# Patient Record
Sex: Female | Born: 1970 | Race: White | Hispanic: No | Smoking: Current every day smoker
Health system: Southern US, Community
[De-identification: ages and names within clinical notes are randomized; demographics above are authoritative.]

## PROBLEM LIST (undated history)

## (undated) DIAGNOSIS — R112 Nausea with vomiting, unspecified: Secondary | ICD-10-CM

## (undated) DIAGNOSIS — C50919 Malignant neoplasm of unspecified site of unspecified female breast: Secondary | ICD-10-CM

## (undated) DIAGNOSIS — M1990 Unspecified osteoarthritis, unspecified site: Secondary | ICD-10-CM

## (undated) DIAGNOSIS — F4310 Post-traumatic stress disorder, unspecified: Secondary | ICD-10-CM

## (undated) DIAGNOSIS — G43909 Migraine, unspecified, not intractable, without status migrainosus: Secondary | ICD-10-CM

## (undated) DIAGNOSIS — N939 Abnormal uterine and vaginal bleeding, unspecified: Secondary | ICD-10-CM

## (undated) DIAGNOSIS — Z720 Tobacco use: Secondary | ICD-10-CM

## (undated) DIAGNOSIS — T783XXA Angioneurotic edema, initial encounter: Secondary | ICD-10-CM

## (undated) DIAGNOSIS — T7840XA Allergy, unspecified, initial encounter: Secondary | ICD-10-CM

## (undated) DIAGNOSIS — R51 Headache: Secondary | ICD-10-CM

## (undated) DIAGNOSIS — F329 Major depressive disorder, single episode, unspecified: Secondary | ICD-10-CM

## (undated) DIAGNOSIS — D497 Neoplasm of unspecified behavior of endocrine glands and other parts of nervous system: Secondary | ICD-10-CM

## (undated) DIAGNOSIS — F419 Anxiety disorder, unspecified: Secondary | ICD-10-CM

## (undated) DIAGNOSIS — F32A Depression, unspecified: Secondary | ICD-10-CM

## (undated) DIAGNOSIS — IMO0002 Reserved for concepts with insufficient information to code with codable children: Secondary | ICD-10-CM

## (undated) DIAGNOSIS — F319 Bipolar disorder, unspecified: Secondary | ICD-10-CM

## (undated) DIAGNOSIS — N809 Endometriosis, unspecified: Secondary | ICD-10-CM

## (undated) DIAGNOSIS — M419 Scoliosis, unspecified: Secondary | ICD-10-CM

## (undated) DIAGNOSIS — L509 Urticaria, unspecified: Secondary | ICD-10-CM

## (undated) DIAGNOSIS — M797 Fibromyalgia: Secondary | ICD-10-CM

## (undated) DIAGNOSIS — J449 Chronic obstructive pulmonary disease, unspecified: Secondary | ICD-10-CM

## (undated) DIAGNOSIS — Z9889 Other specified postprocedural states: Secondary | ICD-10-CM

## (undated) DIAGNOSIS — K219 Gastro-esophageal reflux disease without esophagitis: Secondary | ICD-10-CM

## (undated) HISTORY — PX: FACIAL COSMETIC SURGERY: SHX629

## (undated) HISTORY — PX: BREAST SURGERY: SHX581

## (undated) HISTORY — DX: Migraine, unspecified, not intractable, without status migrainosus: G43.909

## (undated) HISTORY — PX: WISDOM TOOTH EXTRACTION: SHX21

## (undated) HISTORY — DX: Abnormal uterine and vaginal bleeding, unspecified: N93.9

## (undated) HISTORY — PX: DILATION AND CURETTAGE OF UTERUS: SHX78

## (undated) HISTORY — DX: Chronic obstructive pulmonary disease, unspecified: J44.9

## (undated) HISTORY — PX: NOSE SURGERY: SHX723

## (undated) HISTORY — PX: BREAST EXCISIONAL BIOPSY: SUR124

## (undated) HISTORY — PX: NASAL ENDOSCOPY: SHX286

## (undated) HISTORY — DX: Urticaria, unspecified: L50.9

## (undated) HISTORY — DX: Gastro-esophageal reflux disease without esophagitis: K21.9

## (undated) HISTORY — DX: Tobacco use: Z72.0

## (undated) HISTORY — DX: Allergy, unspecified, initial encounter: T78.40XA

## (undated) HISTORY — DX: Malignant neoplasm of unspecified site of unspecified female breast: C50.919

## (undated) HISTORY — DX: Angioneurotic edema, initial encounter: T78.3XXA

## (undated) NOTE — *Deleted (*Deleted)
Patient Care Team: Cain Saupe, MD as PCP - General (Family Medicine) Services, [facility] as Referring Physician Lenice Llamas, MD as Referring Physician (Gynecology) Christia Reading, MD as Consulting Physician (Otolaryngology) Raynelle Highland as Consulting Physician (Neurosurgery)  DIAGNOSIS: No diagnosis found.  SUMMARY OF ONCOLOGIC HISTORY: Oncology History  Ductal carcinoma in situ (DCIS) of right breast  06/29/2019 Initial Diagnosis   Right breast biopsy: Fibrocystic changes, pseudoangiomatous stromal hyperplasia   07/21/2019 Surgery   Right lumpectomy: Dr. Ezzard Standing: Intermediate grade DCIS 0.2 cm, 0.1 cm from anterior margin, LCIS, ER 95%, PR 30% Tis NX stage 0   07/21/2019 Cancer Staging   Staging form: Breast, AJCC 8th Edition - Pathologic stage from 07/21/2019: Stage 0 (pTis (DCIS), pN0, cM0, ER+, PR+) - Signed by Loa Socks, NP on 08/03/2019     CHIEF COMPLIANT: Follow-up of right breast DCIS  INTERVAL HISTORY: Catherine Olsen is a 73 y.o. with above-mentioned history of right breast DCIS who underwent a right lumpectomy, and declined radiation and antiestrogen therapy. She presents to the clinic todayfor follow-up.    ALLERGIES:  is allergic to other, prednisone, sulfamethoxazole, oxycodone, abilify [aripiprazole], celebrex [celecoxib], penicillins, tylenol [acetaminophen], percocet [oxycodone-acetaminophen], sulfa antibiotics, and sulfites.  MEDICATIONS:  Current Outpatient Medications  Medication Sig Dispense Refill  . albuterol (VENTOLIN HFA) 108 (90 Base) MCG/ACT inhaler Inhale 2 puffs into the lungs every 6 (six) hours as needed for wheezing or shortness of breath. 8 g 3  . ascorbic acid (VITAMIN C) 500 MG tablet Take 500 mg by mouth 3 (three) times daily.    . calcium-vitamin D (OSCAL WITH D) 500-200 MG-UNIT tablet Take 1 tablet by mouth 3 (three) times daily.    . divalproex (DEPAKOTE ER) 500 MG 24 hr tablet Take 1 tablet by mouth 2 (two) times  daily.    Marland Kitchen gabapentin (NEURONTIN) 100 MG capsule Take 1 capsule (100 mg total) by mouth 2 (two) times daily at 8am and 2pm. 60 capsule 0  . gabapentin (NEURONTIN) 600 MG tablet Take 1 tablet (600 mg total) by mouth at bedtime. 30 tablet 0  . ibuprofen (ADVIL) 800 MG tablet Take 800 mg by mouth 3 (three) times daily.    Marland Kitchen MAGNESIUM-OXIDE 400 (241.3 Mg) MG tablet Take 1 tablet by mouth in the morning, at noon, and at bedtime.     . Melatonin 10 MG TABS Take 10 mg by mouth at bedtime.    Marland Kitchen ofloxacin (FLOXIN OTIC) 0.3 % OTIC solution Place 5 drops into both ears 2 (two) times daily for 7 days. 5 mL 0  . QUEtiapine (SEROQUEL) 50 MG tablet Take 50-100 mg by mouth at bedtime.     No current facility-administered medications for this visit.    PHYSICAL EXAMINATION: ECOG PERFORMANCE STATUS: {CHL ONC ECOG PS:[phone]}  There were no vitals filed for this visit. There were no vitals filed for this visit.  BREAST:*** No palpable masses or nodules in either right or left breasts. No palpable axillary supraclavicular or infraclavicular adenopathy no breast tenderness or nipple discharge. (exam performed in the presence of a chaperone)  LABORATORY DATA:  I have reviewed the data as listed CMP Latest Ref Rng & Units 01/21/2020 12/15/2019 12/15/2019  Glucose 70 - 99 mg/dL 119(J) 478(G) 956(O)  BUN 6 - 20 mg/dL 12 13 15   Creatinine 0.44 - 1.00 mg/dL 1.30 8.65 7.84  Sodium 135 - 145 mmol/L 138 144 141  Potassium 3.5 - 5.1 mmol/L 4.2 4.1 4.0  Chloride 98 -  111 mmol/L 101 109 108  CO2 22 - 32 mmol/L 27 24 25   Calcium 8.9 - 10.3 mg/dL 9.8 9.1 8.9  Total Protein 6.5 - 8.1 g/dL 6.7 - 6.7  Total Bilirubin 0.3 - 1.2 mg/dL 0.7 - 0.6  Alkaline Phos 38 - 126 U/L 56 - 60  AST 15 - 41 U/L 19 - 16  ALT 0 - 44 U/L 22 - 15    Lab Results  Component Value Date   WBC 6.6 01/21/2020   HGB 14.4 01/21/2020   HCT 42.2 01/21/2020   MCV 95.3 01/21/2020   PLT 250 01/21/2020   NEUTROABS 2.9 01/21/2020     ASSESSMENT & PLAN:  No problem-specific Assessment & Plan notes found for this encounter.    No orders of the defined types were placed in this encounter.  The patient has a good understanding of the overall plan. she agrees with it. she will call with any problems that may develop before the next visit here.  Total time spent: *** mins including face to face time and time spent for planning, charting and coordination of care  Serena Croissant, MD 02/16/2020  I, Kirt Boys Dorshimer, am acting as scribe for Dr. Serena Croissant.  {insert scribe attestation}

---

## 1997-11-28 ENCOUNTER — Emergency Department (HOSPITAL_COMMUNITY): Admission: EM | Admit: 1997-11-28 | Discharge: 1997-11-28 | Payer: Self-pay | Admitting: Emergency Medicine

## 1998-04-18 ENCOUNTER — Emergency Department (HOSPITAL_COMMUNITY): Admission: EM | Admit: 1998-04-18 | Discharge: 1998-04-18 | Payer: Self-pay | Admitting: Emergency Medicine

## 1999-09-11 ENCOUNTER — Encounter: Admission: RE | Admit: 1999-09-11 | Discharge: 1999-09-11 | Payer: Self-pay | Admitting: *Deleted

## 1999-11-17 ENCOUNTER — Emergency Department (HOSPITAL_COMMUNITY): Admission: EM | Admit: 1999-11-17 | Discharge: 1999-11-18 | Payer: Self-pay | Admitting: Emergency Medicine

## 1999-11-18 ENCOUNTER — Encounter: Payer: Self-pay | Admitting: Emergency Medicine

## 1999-11-19 ENCOUNTER — Emergency Department (HOSPITAL_COMMUNITY): Admission: EM | Admit: 1999-11-19 | Discharge: 1999-11-19 | Payer: Self-pay | Admitting: Emergency Medicine

## 1999-11-28 ENCOUNTER — Emergency Department (HOSPITAL_COMMUNITY): Admission: EM | Admit: 1999-11-28 | Discharge: 1999-11-28 | Payer: Self-pay | Admitting: Emergency Medicine

## 1999-11-28 ENCOUNTER — Encounter: Payer: Self-pay | Admitting: Emergency Medicine

## 2000-11-29 ENCOUNTER — Emergency Department (HOSPITAL_COMMUNITY): Admission: EM | Admit: 2000-11-29 | Discharge: 2000-11-29 | Payer: Self-pay | Admitting: Emergency Medicine

## 2000-12-28 ENCOUNTER — Emergency Department (HOSPITAL_COMMUNITY): Admission: EM | Admit: 2000-12-28 | Discharge: 2000-12-28 | Payer: Self-pay | Admitting: Emergency Medicine

## 2000-12-28 ENCOUNTER — Inpatient Hospital Stay (HOSPITAL_COMMUNITY): Admission: AD | Admit: 2000-12-28 | Discharge: 2000-12-28 | Payer: Self-pay | Admitting: Obstetrics and Gynecology

## 2001-01-12 ENCOUNTER — Encounter: Payer: Self-pay | Admitting: *Deleted

## 2001-01-12 ENCOUNTER — Inpatient Hospital Stay (HOSPITAL_COMMUNITY): Admission: AD | Admit: 2001-01-12 | Discharge: 2001-01-12 | Payer: Self-pay | Admitting: Obstetrics

## 2001-01-13 ENCOUNTER — Inpatient Hospital Stay (HOSPITAL_COMMUNITY): Admission: AD | Admit: 2001-01-13 | Discharge: 2001-01-13 | Payer: Self-pay | Admitting: *Deleted

## 2001-01-14 ENCOUNTER — Inpatient Hospital Stay (HOSPITAL_COMMUNITY): Admission: AD | Admit: 2001-01-14 | Discharge: 2001-01-14 | Payer: Self-pay | Admitting: Obstetrics and Gynecology

## 2001-03-08 ENCOUNTER — Encounter: Payer: Self-pay | Admitting: Emergency Medicine

## 2001-03-08 ENCOUNTER — Emergency Department (HOSPITAL_COMMUNITY): Admission: EM | Admit: 2001-03-08 | Discharge: 2001-03-08 | Payer: Self-pay | Admitting: Emergency Medicine

## 2001-05-12 ENCOUNTER — Emergency Department (HOSPITAL_COMMUNITY): Admission: EM | Admit: 2001-05-12 | Discharge: 2001-05-12 | Payer: Self-pay | Admitting: Emergency Medicine

## 2001-05-15 ENCOUNTER — Emergency Department (HOSPITAL_COMMUNITY): Admission: EM | Admit: 2001-05-15 | Discharge: 2001-05-15 | Payer: Self-pay | Admitting: Unknown Physician Specialty

## 2001-06-04 ENCOUNTER — Emergency Department (HOSPITAL_COMMUNITY): Admission: EM | Admit: 2001-06-04 | Discharge: 2001-06-04 | Payer: Self-pay | Admitting: Emergency Medicine

## 2001-07-28 ENCOUNTER — Emergency Department (HOSPITAL_COMMUNITY): Admission: EM | Admit: 2001-07-28 | Discharge: 2001-07-28 | Payer: Self-pay | Admitting: Emergency Medicine

## 2001-07-28 ENCOUNTER — Encounter: Payer: Self-pay | Admitting: Emergency Medicine

## 2001-08-15 ENCOUNTER — Emergency Department (HOSPITAL_COMMUNITY): Admission: EM | Admit: 2001-08-15 | Discharge: 2001-08-15 | Payer: Self-pay | Admitting: Emergency Medicine

## 2001-08-15 ENCOUNTER — Encounter: Payer: Self-pay | Admitting: Emergency Medicine

## 2001-08-24 ENCOUNTER — Emergency Department (HOSPITAL_COMMUNITY): Admission: EM | Admit: 2001-08-24 | Discharge: 2001-08-24 | Payer: Self-pay

## 2001-11-09 ENCOUNTER — Emergency Department (HOSPITAL_COMMUNITY): Admission: EM | Admit: 2001-11-09 | Discharge: 2001-11-09 | Payer: Self-pay | Admitting: *Deleted

## 2001-12-05 ENCOUNTER — Emergency Department (HOSPITAL_COMMUNITY): Admission: EM | Admit: 2001-12-05 | Discharge: 2001-12-05 | Payer: Self-pay | Admitting: Emergency Medicine

## 2001-12-05 ENCOUNTER — Encounter: Payer: Self-pay | Admitting: Emergency Medicine

## 2002-02-17 ENCOUNTER — Emergency Department (HOSPITAL_COMMUNITY): Admission: EM | Admit: 2002-02-17 | Discharge: 2002-02-17 | Payer: Self-pay | Admitting: Emergency Medicine

## 2002-02-18 ENCOUNTER — Ambulatory Visit: Admission: RE | Admit: 2002-02-18 | Discharge: 2002-02-18 | Payer: Self-pay | Admitting: Physician Assistant

## 2002-04-05 ENCOUNTER — Emergency Department (HOSPITAL_COMMUNITY): Admission: EM | Admit: 2002-04-05 | Discharge: 2002-04-05 | Payer: Self-pay | Admitting: Emergency Medicine

## 2002-05-22 ENCOUNTER — Inpatient Hospital Stay (HOSPITAL_COMMUNITY): Admission: AD | Admit: 2002-05-22 | Discharge: 2002-05-22 | Payer: Self-pay | Admitting: *Deleted

## 2002-06-19 ENCOUNTER — Emergency Department (HOSPITAL_COMMUNITY): Admission: EM | Admit: 2002-06-19 | Discharge: 2002-06-19 | Payer: Self-pay

## 2002-06-24 ENCOUNTER — Emergency Department (HOSPITAL_COMMUNITY): Admission: EM | Admit: 2002-06-24 | Discharge: 2002-06-24 | Payer: Self-pay | Admitting: Emergency Medicine

## 2002-06-24 ENCOUNTER — Encounter: Payer: Self-pay | Admitting: Emergency Medicine

## 2002-06-30 ENCOUNTER — Encounter: Admission: RE | Admit: 2002-06-30 | Discharge: 2002-06-30 | Payer: Self-pay | Admitting: Internal Medicine

## 2002-07-05 ENCOUNTER — Ambulatory Visit (HOSPITAL_COMMUNITY): Admission: RE | Admit: 2002-07-05 | Discharge: 2002-07-05 | Payer: Self-pay | Admitting: *Deleted

## 2002-07-05 ENCOUNTER — Encounter: Payer: Self-pay | Admitting: *Deleted

## 2002-08-30 ENCOUNTER — Emergency Department (HOSPITAL_COMMUNITY): Admission: EM | Admit: 2002-08-30 | Discharge: 2002-08-30 | Payer: Self-pay | Admitting: Emergency Medicine

## 2002-09-05 ENCOUNTER — Encounter: Admission: RE | Admit: 2002-09-05 | Discharge: 2002-09-05 | Payer: Self-pay | Admitting: Internal Medicine

## 2002-12-24 ENCOUNTER — Emergency Department (HOSPITAL_COMMUNITY): Admission: EM | Admit: 2002-12-24 | Discharge: 2002-12-24 | Payer: Self-pay | Admitting: Emergency Medicine

## 2002-12-26 ENCOUNTER — Emergency Department (HOSPITAL_COMMUNITY): Admission: EM | Admit: 2002-12-26 | Discharge: 2002-12-26 | Payer: Self-pay | Admitting: Emergency Medicine

## 2002-12-27 ENCOUNTER — Emergency Department (HOSPITAL_COMMUNITY): Admission: EM | Admit: 2002-12-27 | Discharge: 2002-12-27 | Payer: Self-pay | Admitting: Emergency Medicine

## 2003-01-01 ENCOUNTER — Emergency Department (HOSPITAL_COMMUNITY): Admission: EM | Admit: 2003-01-01 | Discharge: 2003-01-01 | Payer: Self-pay | Admitting: Emergency Medicine

## 2003-01-10 ENCOUNTER — Encounter: Admission: RE | Admit: 2003-01-10 | Discharge: 2003-01-10 | Payer: Self-pay | Admitting: Internal Medicine

## 2003-02-23 ENCOUNTER — Encounter: Admission: RE | Admit: 2003-02-23 | Discharge: 2003-02-23 | Payer: Self-pay | Admitting: Internal Medicine

## 2003-03-01 ENCOUNTER — Encounter: Admission: RE | Admit: 2003-03-01 | Discharge: 2003-03-01 | Payer: Self-pay | Admitting: Internal Medicine

## 2003-05-15 ENCOUNTER — Emergency Department (HOSPITAL_COMMUNITY): Admission: EM | Admit: 2003-05-15 | Discharge: 2003-05-15 | Payer: Self-pay | Admitting: Emergency Medicine

## 2003-05-16 ENCOUNTER — Emergency Department (HOSPITAL_COMMUNITY): Admission: EM | Admit: 2003-05-16 | Discharge: 2003-05-17 | Payer: Self-pay | Admitting: Emergency Medicine

## 2003-05-24 ENCOUNTER — Encounter: Admission: RE | Admit: 2003-05-24 | Discharge: 2003-05-24 | Payer: Self-pay | Admitting: Internal Medicine

## 2003-06-09 ENCOUNTER — Emergency Department (HOSPITAL_COMMUNITY): Admission: EM | Admit: 2003-06-09 | Discharge: 2003-06-09 | Payer: Self-pay | Admitting: *Deleted

## 2004-01-24 ENCOUNTER — Emergency Department (HOSPITAL_COMMUNITY): Admission: EM | Admit: 2004-01-24 | Discharge: 2004-01-24 | Payer: Self-pay | Admitting: Emergency Medicine

## 2004-02-05 ENCOUNTER — Emergency Department (HOSPITAL_COMMUNITY): Admission: EM | Admit: 2004-02-05 | Discharge: 2004-02-05 | Payer: Self-pay | Admitting: Emergency Medicine

## 2004-07-01 ENCOUNTER — Ambulatory Visit: Payer: Self-pay | Admitting: Internal Medicine

## 2004-08-07 ENCOUNTER — Ambulatory Visit: Payer: Self-pay | Admitting: Internal Medicine

## 2004-09-19 ENCOUNTER — Emergency Department (HOSPITAL_COMMUNITY): Admission: EM | Admit: 2004-09-19 | Discharge: 2004-09-19 | Payer: Self-pay | Admitting: *Deleted

## 2004-09-22 ENCOUNTER — Emergency Department (HOSPITAL_COMMUNITY): Admission: EM | Admit: 2004-09-22 | Discharge: 2004-09-22 | Payer: Self-pay | Admitting: Emergency Medicine

## 2004-09-23 ENCOUNTER — Emergency Department (HOSPITAL_COMMUNITY): Admission: EM | Admit: 2004-09-23 | Discharge: 2004-09-23 | Payer: Self-pay | Admitting: Emergency Medicine

## 2004-11-18 ENCOUNTER — Encounter: Admission: RE | Admit: 2004-11-18 | Discharge: 2004-11-18 | Payer: Self-pay | Admitting: Obstetrics and Gynecology

## 2005-01-26 ENCOUNTER — Emergency Department (HOSPITAL_COMMUNITY): Admission: EM | Admit: 2005-01-26 | Discharge: 2005-01-26 | Payer: Self-pay | Admitting: Emergency Medicine

## 2005-05-13 ENCOUNTER — Emergency Department (HOSPITAL_COMMUNITY): Admission: EM | Admit: 2005-05-13 | Discharge: 2005-05-13 | Payer: Self-pay | Admitting: Emergency Medicine

## 2005-05-20 ENCOUNTER — Emergency Department (HOSPITAL_COMMUNITY): Admission: EM | Admit: 2005-05-20 | Discharge: 2005-05-20 | Payer: Self-pay | Admitting: *Deleted

## 2005-07-30 ENCOUNTER — Emergency Department (HOSPITAL_COMMUNITY): Admission: EM | Admit: 2005-07-30 | Discharge: 2005-07-30 | Payer: Self-pay | Admitting: Emergency Medicine

## 2006-02-16 ENCOUNTER — Encounter: Admission: RE | Admit: 2006-02-16 | Discharge: 2006-02-16 | Payer: Self-pay | Admitting: Obstetrics and Gynecology

## 2006-07-17 ENCOUNTER — Ambulatory Visit: Payer: Self-pay | Admitting: Internal Medicine

## 2006-07-17 DIAGNOSIS — L821 Other seborrheic keratosis: Secondary | ICD-10-CM | POA: Insufficient documentation

## 2006-07-17 DIAGNOSIS — L708 Other acne: Secondary | ICD-10-CM | POA: Insufficient documentation

## 2006-07-17 DIAGNOSIS — D239 Other benign neoplasm of skin, unspecified: Secondary | ICD-10-CM | POA: Insufficient documentation

## 2006-07-24 ENCOUNTER — Ambulatory Visit: Payer: Self-pay | Admitting: Internal Medicine

## 2006-07-24 ENCOUNTER — Encounter (INDEPENDENT_AMBULATORY_CARE_PROVIDER_SITE_OTHER): Payer: Self-pay | Admitting: Specialist

## 2006-07-24 ENCOUNTER — Other Ambulatory Visit: Admission: RE | Admit: 2006-07-24 | Discharge: 2006-07-24 | Payer: Self-pay | Admitting: Internal Medicine

## 2006-07-24 ENCOUNTER — Encounter: Payer: Self-pay | Admitting: Internal Medicine

## 2006-07-28 ENCOUNTER — Encounter: Payer: Self-pay | Admitting: Internal Medicine

## 2006-08-08 ENCOUNTER — Emergency Department (HOSPITAL_COMMUNITY): Admission: EM | Admit: 2006-08-08 | Discharge: 2006-08-08 | Payer: Self-pay | Admitting: *Deleted

## 2006-09-20 ENCOUNTER — Emergency Department (HOSPITAL_COMMUNITY): Admission: EM | Admit: 2006-09-20 | Discharge: 2006-09-20 | Payer: Self-pay | Admitting: Emergency Medicine

## 2006-11-23 ENCOUNTER — Other Ambulatory Visit (HOSPITAL_COMMUNITY): Admission: RE | Admit: 2006-11-23 | Discharge: 2006-12-04 | Payer: Self-pay | Admitting: Psychiatry

## 2006-11-24 ENCOUNTER — Ambulatory Visit: Payer: Self-pay | Admitting: Psychiatry

## 2006-12-30 ENCOUNTER — Emergency Department (HOSPITAL_COMMUNITY): Admission: EM | Admit: 2006-12-30 | Discharge: 2006-12-30 | Payer: Self-pay | Admitting: Emergency Medicine

## 2007-01-05 ENCOUNTER — Emergency Department (HOSPITAL_COMMUNITY): Admission: EM | Admit: 2007-01-05 | Discharge: 2007-01-06 | Payer: Self-pay | Admitting: Emergency Medicine

## 2007-02-15 ENCOUNTER — Other Ambulatory Visit (HOSPITAL_COMMUNITY): Admission: RE | Admit: 2007-02-15 | Discharge: 2007-05-16 | Payer: Self-pay | Admitting: Psychiatry

## 2007-02-16 ENCOUNTER — Ambulatory Visit: Payer: Self-pay | Admitting: Psychiatry

## 2007-04-01 ENCOUNTER — Emergency Department (HOSPITAL_COMMUNITY): Admission: EM | Admit: 2007-04-01 | Discharge: 2007-04-01 | Payer: Self-pay | Admitting: Emergency Medicine

## 2007-05-17 ENCOUNTER — Encounter: Admission: RE | Admit: 2007-05-17 | Discharge: 2007-05-17 | Payer: Self-pay | Admitting: General Surgery

## 2007-05-19 HISTORY — PX: BREAST BIOPSY: SHX20

## 2007-05-25 ENCOUNTER — Encounter: Admission: RE | Admit: 2007-05-25 | Discharge: 2007-05-25 | Payer: Self-pay | Admitting: General Surgery

## 2007-05-28 ENCOUNTER — Encounter: Admission: RE | Admit: 2007-05-28 | Discharge: 2007-05-28 | Payer: Self-pay | Admitting: General Surgery

## 2007-05-31 ENCOUNTER — Encounter: Admission: RE | Admit: 2007-05-31 | Discharge: 2007-05-31 | Payer: Self-pay | Admitting: General Surgery

## 2007-06-02 HISTORY — PX: BREAST BIOPSY: SHX20

## 2007-06-11 ENCOUNTER — Emergency Department (HOSPITAL_COMMUNITY): Admission: EM | Admit: 2007-06-11 | Discharge: 2007-06-11 | Payer: Self-pay | Admitting: Emergency Medicine

## 2007-06-14 ENCOUNTER — Emergency Department (HOSPITAL_COMMUNITY): Admission: EM | Admit: 2007-06-14 | Discharge: 2007-06-14 | Payer: Self-pay | Admitting: Emergency Medicine

## 2007-06-16 ENCOUNTER — Encounter: Admission: RE | Admit: 2007-06-16 | Discharge: 2007-06-16 | Payer: Self-pay | Admitting: Emergency Medicine

## 2007-07-17 ENCOUNTER — Emergency Department (HOSPITAL_COMMUNITY): Admission: EM | Admit: 2007-07-17 | Discharge: 2007-07-17 | Payer: Self-pay | Admitting: Emergency Medicine

## 2007-08-29 ENCOUNTER — Emergency Department (HOSPITAL_COMMUNITY): Admission: EM | Admit: 2007-08-29 | Discharge: 2007-08-29 | Payer: Self-pay | Admitting: Emergency Medicine

## 2007-09-15 ENCOUNTER — Telehealth: Payer: Self-pay | Admitting: Internal Medicine

## 2007-10-23 ENCOUNTER — Other Ambulatory Visit: Payer: Self-pay | Admitting: Emergency Medicine

## 2007-10-23 ENCOUNTER — Inpatient Hospital Stay (HOSPITAL_COMMUNITY): Admission: AD | Admit: 2007-10-23 | Discharge: 2007-10-31 | Payer: Self-pay | Admitting: Psychiatry

## 2007-10-23 ENCOUNTER — Ambulatory Visit: Payer: Self-pay | Admitting: Psychiatry

## 2007-11-08 ENCOUNTER — Ambulatory Visit: Payer: Self-pay | Admitting: Infectious Diseases

## 2007-12-01 ENCOUNTER — Encounter: Admission: RE | Admit: 2007-12-01 | Discharge: 2007-12-01 | Payer: Self-pay | Admitting: General Surgery

## 2007-12-05 ENCOUNTER — Encounter: Admission: RE | Admit: 2007-12-05 | Discharge: 2007-12-05 | Payer: Self-pay | Admitting: General Surgery

## 2007-12-31 ENCOUNTER — Ambulatory Visit (HOSPITAL_COMMUNITY): Payer: Self-pay | Admitting: Psychiatry

## 2008-01-05 ENCOUNTER — Telehealth: Payer: Self-pay | Admitting: Internal Medicine

## 2008-01-17 ENCOUNTER — Ambulatory Visit: Payer: Self-pay | Admitting: Internal Medicine

## 2008-01-17 ENCOUNTER — Encounter: Payer: Self-pay | Admitting: Internal Medicine

## 2008-01-17 DIAGNOSIS — G4700 Insomnia, unspecified: Secondary | ICD-10-CM | POA: Insufficient documentation

## 2008-02-13 ENCOUNTER — Emergency Department (HOSPITAL_COMMUNITY): Admission: EM | Admit: 2008-02-13 | Discharge: 2008-02-13 | Payer: Self-pay | Admitting: Emergency Medicine

## 2008-05-01 ENCOUNTER — Emergency Department (HOSPITAL_COMMUNITY): Admission: EM | Admit: 2008-05-01 | Discharge: 2008-05-01 | Payer: Self-pay | Admitting: Emergency Medicine

## 2008-06-26 ENCOUNTER — Encounter: Payer: Self-pay | Admitting: Internal Medicine

## 2008-07-28 ENCOUNTER — Inpatient Hospital Stay (HOSPITAL_COMMUNITY): Admission: RE | Admit: 2008-07-28 | Discharge: 2008-07-29 | Payer: Self-pay | Admitting: *Deleted

## 2008-07-28 ENCOUNTER — Ambulatory Visit: Payer: Self-pay | Admitting: *Deleted

## 2008-10-31 ENCOUNTER — Emergency Department (HOSPITAL_COMMUNITY): Admission: EM | Admit: 2008-10-31 | Discharge: 2008-11-01 | Payer: Self-pay | Admitting: Emergency Medicine

## 2008-11-14 ENCOUNTER — Ambulatory Visit: Payer: Self-pay | Admitting: *Deleted

## 2008-11-14 ENCOUNTER — Other Ambulatory Visit: Payer: Self-pay | Admitting: Emergency Medicine

## 2008-11-14 ENCOUNTER — Inpatient Hospital Stay (HOSPITAL_COMMUNITY): Admission: RE | Admit: 2008-11-14 | Discharge: 2008-11-15 | Payer: Self-pay | Admitting: *Deleted

## 2008-12-14 ENCOUNTER — Emergency Department (HOSPITAL_COMMUNITY): Admission: EM | Admit: 2008-12-14 | Discharge: 2008-12-14 | Payer: Self-pay | Admitting: Emergency Medicine

## 2008-12-24 ENCOUNTER — Emergency Department (HOSPITAL_COMMUNITY): Admission: EM | Admit: 2008-12-24 | Discharge: 2008-12-24 | Payer: Self-pay | Admitting: Emergency Medicine

## 2008-12-27 ENCOUNTER — Telehealth: Payer: Self-pay | Admitting: Internal Medicine

## 2009-01-09 ENCOUNTER — Encounter: Payer: Self-pay | Admitting: Internal Medicine

## 2009-01-09 ENCOUNTER — Ambulatory Visit: Payer: Self-pay | Admitting: Internal Medicine

## 2009-01-09 DIAGNOSIS — F4310 Post-traumatic stress disorder, unspecified: Secondary | ICD-10-CM | POA: Insufficient documentation

## 2009-01-09 DIAGNOSIS — IMO0002 Reserved for concepts with insufficient information to code with codable children: Secondary | ICD-10-CM | POA: Insufficient documentation

## 2009-01-10 LAB — CONVERTED CEMR LAB
Beta hcg, urine, semiquantitative: NEGATIVE
Chlamydia, Swab/Urine, PCR: NEGATIVE
GC Probe Amp, Urine: NEGATIVE

## 2009-05-19 ENCOUNTER — Other Ambulatory Visit: Payer: Self-pay | Admitting: Emergency Medicine

## 2009-05-20 ENCOUNTER — Inpatient Hospital Stay (HOSPITAL_COMMUNITY): Admission: RE | Admit: 2009-05-20 | Discharge: 2009-05-22 | Payer: Self-pay | Admitting: Psychiatry

## 2009-05-20 ENCOUNTER — Ambulatory Visit: Payer: Self-pay | Admitting: Psychiatry

## 2009-05-30 ENCOUNTER — Ambulatory Visit: Payer: Self-pay | Admitting: Internal Medicine

## 2009-05-30 DIAGNOSIS — M064 Inflammatory polyarthropathy: Secondary | ICD-10-CM | POA: Insufficient documentation

## 2009-05-30 LAB — CONVERTED CEMR LAB: Sed Rate: 7 mm/hr (ref 0–22)

## 2009-06-01 ENCOUNTER — Telehealth: Payer: Self-pay | Admitting: Internal Medicine

## 2009-06-04 ENCOUNTER — Telehealth: Payer: Self-pay | Admitting: Internal Medicine

## 2009-06-23 ENCOUNTER — Emergency Department (HOSPITAL_COMMUNITY): Admission: EM | Admit: 2009-06-23 | Discharge: 2009-06-23 | Payer: Self-pay | Admitting: Emergency Medicine

## 2009-06-25 ENCOUNTER — Ambulatory Visit: Payer: Self-pay | Admitting: Internal Medicine

## 2009-06-25 DIAGNOSIS — N939 Abnormal uterine and vaginal bleeding, unspecified: Secondary | ICD-10-CM

## 2009-06-25 HISTORY — DX: Abnormal uterine and vaginal bleeding, unspecified: N93.9

## 2009-08-09 ENCOUNTER — Encounter: Admission: RE | Admit: 2009-08-09 | Discharge: 2009-08-09 | Payer: Self-pay | Admitting: Orthopaedic Surgery

## 2009-12-08 ENCOUNTER — Ambulatory Visit: Payer: Self-pay | Admitting: Psychiatry

## 2009-12-08 ENCOUNTER — Inpatient Hospital Stay (HOSPITAL_COMMUNITY)
Admission: RE | Admit: 2009-12-08 | Discharge: 2009-12-10 | Payer: Self-pay | Source: Home / Self Care | Admitting: Psychiatry

## 2010-01-09 ENCOUNTER — Emergency Department (HOSPITAL_COMMUNITY): Admission: EM | Admit: 2010-01-09 | Discharge: 2010-01-09 | Payer: Self-pay | Admitting: Emergency Medicine

## 2010-04-05 ENCOUNTER — Inpatient Hospital Stay (HOSPITAL_COMMUNITY)
Admission: RE | Admit: 2010-04-05 | Discharge: 2010-04-08 | Payer: Self-pay | Source: Home / Self Care | Attending: Psychiatry | Admitting: Psychiatry

## 2010-04-29 ENCOUNTER — Encounter: Payer: Self-pay | Admitting: General Surgery
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
## 2010-05-07 NOTE — Progress Notes (Signed)
  Phone Note Outgoing Call   Call placed by: [facility] Call placed to: Patient Reason for Call: Discuss lab or test results Summary of Call: I spoke with patient at length regarding her test results. Patient was ANA negative however continues to believe she has an autoimmune pathology such as Lupus. She is very upset about her symptoms and is unhappy that there is not a diagnosis attached. I explained to patient that fatigue can result from a number of causes and that it is not necessarily related to autoimmune disease. Patient states she will contact a rheumatologist for further work up.

## 2010-05-07 NOTE — Progress Notes (Signed)
Summary: phone call-test results/gp  Phone Note Call from Patient   Caller: Patient Summary of Call: Pt. called requesting test results.  Can you call her?  Thanks Initial call taken by: Chinita Pester RN,  June 01, 2009 10:42 AM  Follow-up for Phone Call        Called patient back and informed her that only ESR is back and not the ANA and that she will be informed when test results are back. I told her to follow up by next friday if she has not heard anything from me. Follow-up by: [facility]

## 2010-05-10 ENCOUNTER — Other Ambulatory Visit: Payer: Self-pay | Admitting: *Deleted

## 2010-05-13 MED ORDER — TRETINOIN 0.025 % EX CREA: TOPICAL_CREAM | Freq: Every day | CUTANEOUS | Status: DC

## 2010-05-13 NOTE — Telephone Encounter (Signed)
Patient is of child bearing age. Possible teratogenic effects with Retin-A. I have educated her on this before. Ok to refill - she probably needs to be seen at least 1 time per year.

## 2010-05-14 ENCOUNTER — Inpatient Hospital Stay (INDEPENDENT_AMBULATORY_CARE_PROVIDER_SITE_OTHER)
Admission: RE | Admit: 2010-05-14 | Discharge: 2010-05-14 | Disposition: A | Discharge status: Home / Self Care | Payer: Medicare Other | Source: Ambulatory Visit | Attending: Family Medicine | Admitting: Family Medicine

## 2010-05-14 DIAGNOSIS — L708 Other acne: Secondary | ICD-10-CM

## 2010-05-20 ENCOUNTER — Other Ambulatory Visit: Payer: Self-pay | Admitting: *Deleted

## 2010-05-20 MED ORDER — TRETINOIN 0.025 % EX CREA: TOPICAL_CREAM | Freq: Every day | CUTANEOUS | Status: DC

## 2010-05-20 NOTE — Telephone Encounter (Signed)
Amount changed to 20 G at pt's request. New order put in and filled

## 2010-05-20 NOTE — Telephone Encounter (Signed)
Pt is requesting 20 G NOT 45 g.  Pharmacy needs a new Rx with correction. Will you please change?

## 2010-05-21 ENCOUNTER — Telehealth: Payer: Self-pay | Admitting: *Deleted

## 2010-05-21 NOTE — Telephone Encounter (Signed)
Pt left a rude message about her refill not being done correctly, attempted to call her back, left a message to rtc

## 2010-05-22 ENCOUNTER — Encounter: Payer: Self-pay | Admitting: Internal Medicine

## 2010-05-22 ENCOUNTER — Telehealth: Payer: Self-pay | Admitting: *Deleted

## 2010-05-22 ENCOUNTER — Ambulatory Visit (INDEPENDENT_AMBULATORY_CARE_PROVIDER_SITE_OTHER): Payer: Medicare Other | Admitting: Internal Medicine

## 2010-05-22 VITALS — BP 109/74 | HR 84 | Temp 96.1°F | Ht 68.0 in | Wt 140.3 lb

## 2010-05-22 DIAGNOSIS — L708 Other acne: Secondary | ICD-10-CM

## 2010-05-22 MED ORDER — TRETINOIN 0.025 % EX CREA: TOPICAL_CREAM | Freq: Every day | CUTANEOUS | Status: DC

## 2010-05-22 NOTE — Assessment & Plan Note (Signed)
Patient's tretinoin 0.025% was pre-authorized The assistant on the pre-authorization phone line [phone]) seemed to have difficulty with reading and spelling, so we will hope that this got through

## 2010-05-22 NOTE — Telephone Encounter (Signed)
Prior Berkley Harvey was initiated on 2/15 at 1553. Info was given over ph and requested the review be expedited, will receive answer in 24 hrs

## 2010-05-22 NOTE — Progress Notes (Signed)
  Subjective:    Patient ID: Catherine Olsen, female    DOB: February 21, 1971, 40 y.o.   MRN: [id number]  HPI Patient is here for routine follow up of her acne/acne vulgaris.  No new issues. Is followed by her psychiatrist and counselor for her Bipolar disease, currently stable although she did have a hospitalization at [facility] for severe depression in the Fall of 2011.  Not on any mood stabalizers at this time-takes temazepam.  Acne is well controlled when taking the tretinoin.   Review of Systems     Objective:   Physical Exam  Skin:          Scattered acne as noted          Assessment & Plan:

## 2010-05-29 ENCOUNTER — Other Ambulatory Visit: Payer: Self-pay | Admitting: Rheumatology

## 2010-05-29 ENCOUNTER — Ambulatory Visit (HOSPITAL_COMMUNITY)
Admission: RE | Admit: 2010-05-29 | Discharge: 2010-05-29 | Disposition: A | Discharge status: Home / Self Care | Payer: Medicare Other | Source: Ambulatory Visit | Attending: Rheumatology | Admitting: Rheumatology

## 2010-05-29 DIAGNOSIS — D869 Sarcoidosis, unspecified: Secondary | ICD-10-CM | POA: Insufficient documentation

## 2010-06-19 LAB — POCT I-STAT, CHEM 8
BUN: 6 mg/dL (ref 6–23)
Creatinine, Ser: 0.7 mg/dL (ref 0.4–1.2)
Glucose, Bld: 90 mg/dL (ref 70–99)
Hemoglobin: 16.3 g/dL — ABNORMAL HIGH (ref 12.0–15.0)
Potassium: 3.7 mEq/L (ref 3.5–5.1)

## 2010-06-19 LAB — CBC
Hemoglobin: 15.7 g/dL — ABNORMAL HIGH (ref 12.0–15.0)
MCH: 33.4 pg (ref 26.0–34.0)
MCV: 93.6 fL (ref 78.0–100.0)
RBC: 4.7 MIL/uL (ref 3.87–5.11)

## 2010-06-19 LAB — DIFFERENTIAL
Eosinophils Absolute: 0.1 10*3/uL (ref 0.0–0.7)
Lymphocytes Relative: 10 % — ABNORMAL LOW (ref 12–46)
Lymphs Abs: 0.6 10*3/uL — ABNORMAL LOW (ref 0.7–4.0)
Monocytes Relative: 5 % (ref 3–12)
Neutrophils Relative %: 83 % — ABNORMAL HIGH (ref 43–77)

## 2010-06-26 LAB — CBC
HCT: 42.7 % (ref 36.0–46.0)
Hemoglobin: 14.8 g/dL (ref 12.0–15.0)
MCHC: 34.8 g/dL (ref 30.0–36.0)
MCV: 96.4 fL (ref 78.0–100.0)
RBC: 4.43 MIL/uL (ref 3.87–5.11)

## 2010-06-26 LAB — COMPREHENSIVE METABOLIC PANEL
BUN: 16 mg/dL (ref 6–23)
CO2: 25 mEq/L (ref 19–32)
Calcium: 9.4 mg/dL (ref 8.4–10.5)
Chloride: 104 mEq/L (ref 96–112)
Creatinine, Ser: 0.89 mg/dL (ref 0.4–1.2)
GFR calc Af Amer: >60 mL/min (ref >60)
GFR calc non Af Amer: >60 mL/min (ref >60)
Glucose, Bld: 96 mg/dL (ref 70–99)
Total Bilirubin: 0.4 mg/dL (ref 0.3–1.2)

## 2010-06-26 LAB — RAPID URINE DRUG SCREEN, HOSP PERFORMED
Barbiturates: NOT DETECTED
Benzodiazepines: POSITIVE — AB
Cocaine: POSITIVE — AB
Opiates: NOT DETECTED

## 2010-06-30 LAB — WET PREP, GENITAL: Trich, Wet Prep: NONE SEEN

## 2010-06-30 LAB — URINE MICROSCOPIC-ADD ON

## 2010-06-30 LAB — URINALYSIS, ROUTINE W REFLEX MICROSCOPIC
Glucose, UA: NEGATIVE mg/dL
Leukocytes, UA: NEGATIVE
Protein, ur: NEGATIVE mg/dL
pH: 6.5 (ref 5.0–8.0)

## 2010-06-30 LAB — POCT I-STAT, CHEM 8
BUN: 4 mg/dL — ABNORMAL LOW (ref 6–23)
Creatinine, Ser: 0.7 mg/dL (ref 0.4–1.2)
Sodium: 139 mEq/L (ref 135–145)
TCO2: 27 mmol/L (ref 0–100)

## 2010-06-30 LAB — GC/CHLAMYDIA PROBE AMP, GENITAL: Chlamydia, DNA Probe: NEGATIVE

## 2010-07-12 LAB — WET PREP, GENITAL
Clue Cells Wet Prep HPF POC: NONE SEEN
Trich, Wet Prep: NONE SEEN
Yeast Wet Prep HPF POC: NONE SEEN

## 2010-07-12 LAB — RAPID STREP SCREEN (MED CTR MEBANE ONLY): Streptococcus, Group A Screen (Direct): POSITIVE — AB

## 2010-07-12 LAB — URINALYSIS, ROUTINE W REFLEX MICROSCOPIC
Bilirubin Urine: NEGATIVE
Glucose, UA: NEGATIVE mg/dL
Ketones, ur: 15 mg/dL — AB
Nitrite: NEGATIVE
Protein, ur: NEGATIVE mg/dL
Specific Gravity, Urine: 1.016 (ref 1.005–1.030)
Urobilinogen, UA: 0.2 mg/dL (ref 0.0–1.0)
pH: 5.5 (ref 5.0–8.0)

## 2010-07-12 LAB — GC/CHLAMYDIA PROBE AMP, GENITAL
Chlamydia, DNA Probe: NEGATIVE
GC Probe Amp, Genital: NEGATIVE

## 2010-07-12 LAB — URINE MICROSCOPIC-ADD ON

## 2010-07-12 LAB — POCT PREGNANCY, URINE: Preg Test, Ur: NEGATIVE

## 2010-07-13 LAB — DIFFERENTIAL
Basophils Absolute: 0.1 10*3/uL (ref 0.0–0.1)
Basophils Relative: 1 % (ref 0–1)
Eosinophils Absolute: 0 10*3/uL (ref 0.0–0.7)
Eosinophils Relative: 0 % (ref 0–5)
Lymphocytes Relative: 9 % — ABNORMAL LOW (ref 12–46)
Lymphs Abs: 1.3 10*3/uL (ref 0.7–4.0)
Monocytes Absolute: 0.5 10*3/uL (ref 0.1–1.0)
Monocytes Relative: 4 % (ref 3–12)
Neutro Abs: 12.1 10*3/uL — ABNORMAL HIGH (ref 1.7–7.7)
Neutrophils Relative %: 87 % — ABNORMAL HIGH (ref 43–77)

## 2010-07-13 LAB — CBC
HCT: 44.3 % (ref 36.0–46.0)
Hemoglobin: 14.5 g/dL (ref 12.0–15.0)
MCHC: 32.9 g/dL (ref 30.0–36.0)
MCV: 97.9 fL (ref 78.0–100.0)
Platelets: 224 10*3/uL (ref 150–400)
RBC: 4.52 MIL/uL (ref 3.87–5.11)
RDW: 14.2 % (ref 11.5–15.5)
WBC: 13.9 10*3/uL — ABNORMAL HIGH (ref 4.0–10.5)

## 2010-07-13 LAB — ETHANOL: Alcohol, Ethyl (B): <5 mg/dL (ref 0–10)

## 2010-07-13 LAB — COMPREHENSIVE METABOLIC PANEL
ALT: 12 U/L (ref 0–35)
AST: 16 U/L (ref 0–37)
Albumin: 4.1 g/dL (ref 3.5–5.2)
Alkaline Phosphatase: 57 U/L (ref 39–117)
BUN: 9 mg/dL (ref 6–23)
CO2: 25 mEq/L (ref 19–32)
Calcium: 8.9 mg/dL (ref 8.4–10.5)
Chloride: 109 mEq/L (ref 96–112)
Creatinine, Ser: 0.68 mg/dL (ref 0.4–1.2)
GFR calc Af Amer: >60 mL/min (ref >60)
GFR calc non Af Amer: >60 mL/min (ref >60)
Glucose, Bld: 82 mg/dL (ref 70–99)
Potassium: 3.8 mEq/L (ref 3.5–5.1)
Sodium: 140 mEq/L (ref 135–145)
Total Bilirubin: 0.6 mg/dL (ref 0.3–1.2)
Total Protein: 6.9 g/dL (ref 6.0–8.3)

## 2010-07-13 LAB — PREGNANCY, URINE: Preg Test, Ur: NEGATIVE

## 2010-07-13 LAB — RAPID URINE DRUG SCREEN, HOSP PERFORMED
Amphetamines: NOT DETECTED
Barbiturates: NOT DETECTED
Benzodiazepines: NOT DETECTED
Cocaine: POSITIVE — AB
Opiates: NOT DETECTED
Tetrahydrocannabinol: NOT DETECTED

## 2010-07-14 LAB — CBC
HCT: 45.7 % (ref 36.0–46.0)
Hemoglobin: 15.2 g/dL — ABNORMAL HIGH (ref 12.0–15.0)
MCHC: 33.3 g/dL (ref 30.0–36.0)
MCV: 97.5 fL (ref 78.0–100.0)
Platelets: 199 10*3/uL (ref 150–400)
RBC: 4.68 MIL/uL (ref 3.87–5.11)
RDW: 14.5 % (ref 11.5–15.5)
WBC: 10.4 10*3/uL (ref 4.0–10.5)

## 2010-07-14 LAB — URINALYSIS, ROUTINE W REFLEX MICROSCOPIC
Bilirubin Urine: NEGATIVE
Glucose, UA: NEGATIVE mg/dL
Hgb urine dipstick: NEGATIVE
Ketones, ur: 15 mg/dL — AB
Nitrite: NEGATIVE
Protein, ur: NEGATIVE mg/dL
Specific Gravity, Urine: 1.018 (ref 1.005–1.030)
Urobilinogen, UA: 0.2 mg/dL (ref 0.0–1.0)
pH: 5.5 (ref 5.0–8.0)

## 2010-07-14 LAB — DIFFERENTIAL
Basophils Absolute: 0 10*3/uL (ref 0.0–0.1)
Basophils Relative: 0 % (ref 0–1)
Eosinophils Absolute: 0.1 10*3/uL (ref 0.0–0.7)
Eosinophils Relative: 1 % (ref 0–5)
Lymphocytes Relative: 22 % (ref 12–46)
Lymphs Abs: 2.2 10*3/uL (ref 0.7–4.0)
Monocytes Absolute: 0.6 10*3/uL (ref 0.1–1.0)
Monocytes Relative: 6 % (ref 3–12)
Neutro Abs: 7.4 10*3/uL (ref 1.7–7.7)
Neutrophils Relative %: 72 % (ref 43–77)

## 2010-07-14 LAB — BASIC METABOLIC PANEL
BUN: 12 mg/dL (ref 6–23)
CO2: 26 mEq/L (ref 19–32)
Calcium: 9.3 mg/dL (ref 8.4–10.5)
Chloride: 105 mEq/L (ref 96–112)
Creatinine, Ser: 0.66 mg/dL (ref 0.4–1.2)
GFR calc Af Amer: >60 mL/min (ref >60)
GFR calc non Af Amer: >60 mL/min (ref >60)
Glucose, Bld: 86 mg/dL (ref 70–99)
Potassium: 3.8 mEq/L (ref 3.5–5.1)
Sodium: 137 mEq/L (ref 135–145)

## 2010-07-14 LAB — RAPID URINE DRUG SCREEN, HOSP PERFORMED
Amphetamines: NOT DETECTED
Barbiturates: NOT DETECTED
Benzodiazepines: NOT DETECTED
Cocaine: POSITIVE — AB
Opiates: NOT DETECTED
Tetrahydrocannabinol: NOT DETECTED

## 2010-07-14 LAB — URINE MICROSCOPIC-ADD ON

## 2010-07-14 LAB — ETHANOL: Alcohol, Ethyl (B): <5 mg/dL (ref 0–10)

## 2010-07-14 LAB — POCT PREGNANCY, URINE: Preg Test, Ur: NEGATIVE

## 2010-07-17 LAB — CBC
HCT: 43.3 % (ref 36.0–46.0)
Hemoglobin: 14.7 g/dL (ref 12.0–15.0)
MCHC: 33.9 g/dL (ref 30.0–36.0)
MCV: 97.1 fL (ref 78.0–100.0)
Platelets: 224 10*3/uL (ref 150–400)
RBC: 4.46 MIL/uL (ref 3.87–5.11)
RDW: 14.7 % (ref 11.5–15.5)
WBC: 7.6 10*3/uL (ref 4.0–10.5)

## 2010-07-17 LAB — COMPREHENSIVE METABOLIC PANEL
ALT: 12 U/L (ref 0–35)
AST: 13 U/L (ref 0–37)
Albumin: 3.8 g/dL (ref 3.5–5.2)
Alkaline Phosphatase: 53 U/L (ref 39–117)
BUN: 7 mg/dL (ref 6–23)
CO2: 25 mEq/L (ref 19–32)
Calcium: 8.7 mg/dL (ref 8.4–10.5)
Chloride: 106 mEq/L (ref 96–112)
Creatinine, Ser: 0.65 mg/dL (ref 0.4–1.2)
GFR calc Af Amer: >60 mL/min (ref >60)
GFR calc non Af Amer: >60 mL/min (ref >60)
Glucose, Bld: 91 mg/dL (ref 70–99)
Potassium: 3.9 mEq/L (ref 3.5–5.1)
Sodium: 137 mEq/L (ref 135–145)
Total Bilirubin: 0.6 mg/dL (ref 0.3–1.2)
Total Protein: 5.9 g/dL — ABNORMAL LOW (ref 6.0–8.3)

## 2010-07-17 LAB — DRUGS OF ABUSE SCREEN W/O ALC, ROUTINE URINE
Amphetamine Screen, Ur: NEGATIVE
Barbiturate Quant, Ur: NEGATIVE
Benzodiazepines.: NEGATIVE
Cocaine Metabolites: NEGATIVE
Creatinine,U: 72.5 mg/dL
Marijuana Metabolite: NEGATIVE
Methadone: NEGATIVE
Opiate Screen, Urine: NEGATIVE
Phencyclidine (PCP): NEGATIVE
Propoxyphene: NEGATIVE

## 2010-07-22 LAB — POCT PREGNANCY, URINE: Preg Test, Ur: NEGATIVE
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
## 2010-08-23 NOTE — Consult Note (Signed)
[facility]  Patient:    Catherine Olsen, Catherine Olsen Visit Number: [id number] MRN: [id number]          Service Type: GYN Location: [facility] Attending Physician:  Tammi Sou Dictated by:   Georgina Peer, M.D. Consultation Date: 01/12/01 Admit Date:  01/12/2001                            Consultation Report  GYNECOLOGY CONSULTATION  REQUESTING PRACTITIONER:      Kerrie Buffalo, N.P. at [facility] in Maternity Admissions.  HISTORY:                      This consultation was requested by Kerrie Buffalo, N.P. for a Gyn consult for patient seen by her in emergency department at [facility]. The patient is a 40 year old, gravida 3, para 0-0-3-0, who is status post elective abortion at [facility] on October 4. Since that time, she has had some heavy bleeding with clots and cramping. The patient denies fever, nausea, vomiting; there is no bowel or bladder dysfunction, no severe pain. She is using about four pads per day, exclusive of the blood clots. She is concerned that there may be a complication and presents for evaluation. I was asked to give an opinion on this patients condition. She is currently on Anaprox, doxycycline, and Ex-Lax.  ALLERGIES:                    She is allergic to OPIATES.  OB/GYN HISTORY:               She has had two elective abortions and one spontaneous loss. Last Pap was 10 years ago and normal.  PAST MEDICAL HISTORY:         She has a history of scoliosis and depression. She has been off medicines for four years.  PAST SURGICAL HISTORY:        She has had rhinoplasty and facial surgery.  SOCIAL HISTORY:               She smokes two packs cigarettes daily. She has been hospitalized previously for depression. Denies use of alcohol or drugs.  FAMILY HISTORY:               Otherwise insignificant.  PHYSICAL EXAMINATION:  GENERAL:                      Well-developed white female, no acute distress.  VITAL  SIGNS:                  Temperature 98.2, pulse 68, respiratory rate 18, blood pressure 127/82.  NECK:                         Supple.  LUNGS:                        Clear.  HEART:                        Regular rate and rhythm.  BACK:                         Without CVA tenderness.  ABDOMEN:  Soft, good bowel sounds, nontender to palpation. No rebound or guarding.  PELVIC:                       Normal external genitals, no unusual discharge. Slight amount of dark blood. Cervix was closed with no cervical motion tenderness. Uterus was mid position, retroflexed, mildly tender, but top normal size. Adnexa nontender.  RECTAL:                       Not examined.  ULTRASOUND:                   Ultrasound showed no evidence of products of conception or adnexal mass.  IMPRESSION:                   Bleeding after elective abortion. No evidence of retained products of conception. No evidence of endometritis.  RECOMMENDATIONS:              The patient will continue the Anaprox for pain. She will report heavy bleeding, fever, or chills and follow up in 10 days either at [facility] or in Dr. Elliot Gault office. The patient understood these instructions and will follow up as above mentioned. Dictated by:   Georgina Peer, M.D. Attending Physician:  Tammi Sou DD:  01/12/01 TD:  01/12/01 Job: [id number]

## 2010-09-11 ENCOUNTER — Ambulatory Visit: Payer: Medicare Other | Admitting: Physical Therapy

## 2010-09-24 ENCOUNTER — Ambulatory Visit: Payer: Medicare Other

## 2010-12-31 LAB — URINALYSIS, ROUTINE W REFLEX MICROSCOPIC
Bilirubin Urine: NEGATIVE
Glucose, UA: NEGATIVE
Hgb urine dipstick: NEGATIVE
Ketones, ur: NEGATIVE
Nitrite: NEGATIVE
Protein, ur: NEGATIVE
Specific Gravity, Urine: 1.013
Urobilinogen, UA: 0.2
pH: 7.5

## 2010-12-31 LAB — RAPID URINE DRUG SCREEN, HOSP PERFORMED
Amphetamines: NOT DETECTED
Barbiturates: NOT DETECTED
Benzodiazepines: POSITIVE — AB
Cocaine: POSITIVE — AB
Opiates: NOT DETECTED
Tetrahydrocannabinol: NOT DETECTED

## 2010-12-31 LAB — PREGNANCY, URINE: Preg Test, Ur: NEGATIVE

## 2011-01-03 LAB — RAPID URINE DRUG SCREEN, HOSP PERFORMED
Amphetamines: NOT DETECTED
Barbiturates: NOT DETECTED
Benzodiazepines: POSITIVE — AB
Cocaine: POSITIVE — AB
Opiates: NOT DETECTED
Tetrahydrocannabinol: NOT DETECTED

## 2011-01-03 LAB — HEPATIC FUNCTION PANEL
ALT: 11
AST: 11
Albumin: 3.5
Alkaline Phosphatase: 57
Bilirubin, Direct: 0.2
Indirect Bilirubin: 0.6
Total Bilirubin: 0.8
Total Protein: 6.1

## 2011-01-03 LAB — CBC
HCT: 42.6
Hemoglobin: 14.7
MCHC: 34.4
MCV: 96.1
Platelets: 187
RBC: 4.43
RDW: 14.1
WBC: 7.4

## 2011-01-03 LAB — POCT I-STAT, CHEM 8
BUN: 7
Calcium, Ion: 1.15
Chloride: 105
Creatinine, Ser: 0.8
Glucose, Bld: 87
HCT: 47 — ABNORMAL HIGH
Hemoglobin: 16 — ABNORMAL HIGH
Potassium: 3.8
Sodium: 140
TCO2: 23

## 2011-01-03 LAB — PREGNANCY, URINE
Preg Test, Ur: NEGATIVE
Preg Test, Ur: NEGATIVE

## 2011-01-03 LAB — URINALYSIS, ROUTINE W REFLEX MICROSCOPIC
Bilirubin Urine: NEGATIVE
Glucose, UA: NEGATIVE
Hgb urine dipstick: NEGATIVE
Ketones, ur: NEGATIVE
Nitrite: NEGATIVE
Protein, ur: NEGATIVE
Specific Gravity, Urine: 1.024
Urobilinogen, UA: 0.2
pH: 7

## 2011-01-03 LAB — URINE MICROSCOPIC-ADD ON

## 2011-01-03 LAB — ETHANOL: Alcohol, Ethyl (B): <5

## 2011-01-03 LAB — TSH: TSH: 1.021

## 2011-01-17 LAB — URINE DRUGS OF ABUSE SCREEN W ALC, ROUTINE (REF LAB)
Amphetamine Screen, Ur: NEGATIVE
Barbiturate Quant, Ur: NEGATIVE
Benzodiazepines.: NEGATIVE
Cocaine Metabolites: NEGATIVE
Creatinine,U: 10
Ethyl Alcohol: <5
Marijuana Metabolite: NEGATIVE
Methadone: NEGATIVE
Opiate Screen, Urine: NEGATIVE
Phencyclidine (PCP): NEGATIVE
Propoxyphene: NEGATIVE

## 2011-03-16 ENCOUNTER — Other Ambulatory Visit: Payer: Self-pay | Admitting: Internal Medicine

## 2011-03-26 ENCOUNTER — Encounter (HOSPITAL_COMMUNITY): Payer: Self-pay | Admitting: *Deleted

## 2011-03-26 ENCOUNTER — Emergency Department (HOSPITAL_COMMUNITY)
Admission: EM | Admit: 2011-03-26 | Discharge: 2011-03-26 | Disposition: A | Discharge status: Home / Self Care | Payer: Medicare Other | Attending: Emergency Medicine | Admitting: Emergency Medicine

## 2011-03-26 DIAGNOSIS — J111 Influenza due to unidentified influenza virus with other respiratory manifestations: Secondary | ICD-10-CM | POA: Insufficient documentation

## 2011-03-26 DIAGNOSIS — F17200 Nicotine dependence, unspecified, uncomplicated: Secondary | ICD-10-CM | POA: Insufficient documentation

## 2011-03-26 HISTORY — DX: Fibromyalgia: M79.7

## 2011-03-26 MED ORDER — ACETAMINOPHEN 325 MG PO TABS
650.0000 mg | ORAL_TABLET | Freq: Once | ORAL | Status: AC
  Administered 2011-03-26: 650 mg via ORAL
  Filled 2011-03-26: qty 2

## 2011-03-26 NOTE — ED Notes (Signed)
Pt c/o cough, fever and nasal drainage 3 days, states was in [facility] behavioral health and was exposed to flu.

## 2011-03-26 NOTE — ED Provider Notes (Signed)
History     CSN: [id number] Arrival date & time: 03/26/2011 12:07 PM   First MD Initiated Contact with Patient 03/26/11 1329      Chief Complaint  Patient presents with  . Influenza  . Generalized Body Aches  . Cough  . Chills    (Consider location/radiation/quality/duration/timing/severity/associated sxs/prior treatment) Patient is a 40 y.o. female presenting with flu symptoms. The history is provided by the patient.  Influenza This is a new problem. The current episode started in the past 7 days. The problem occurs constantly. The problem has been unchanged. Associated symptoms include congestion, fatigue and a fever. Pertinent negatives include no abdominal pain, diaphoresis, joint swelling, nausea, neck pain, numbness, rash, urinary symptoms, vertigo, visual change, vomiting or weakness. The symptoms are aggravated by nothing. She has tried nothing for the symptoms.    Past Medical History  Diagnosis Date  . Asthma   . Fibromyalgia     History reviewed. No pertinent past surgical history.  No family history on file.  History  Substance Use Topics  . Smoking status: Current Everyday Smoker -- 1.0 packs/day for 26 years    Types: Cigarettes  . Smokeless tobacco: Not on file  . Alcohol Use: Yes     occasionally     Review of Systems  Constitutional: Positive for fever and fatigue. Negative for diaphoresis.  HENT: Positive for congestion. Negative for neck pain.   Gastrointestinal: Negative for nausea, vomiting and abdominal pain.  Musculoskeletal: Negative for joint swelling.  Skin: Negative for rash.  Neurological: Negative for vertigo, weakness and numbness.  All other systems reviewed and are negative.    Allergies  Sulfonamide derivatives  Home Medications   Current Outpatient Rx  Name Route Sig Dispense Refill  . ASPIRIN 325 MG PO TABS Oral Take 325 mg by mouth daily.      Marland Kitchen VITAMIN D 1000 UNITS PO TABS Oral Take 1,000 Units by mouth daily.      Marland Kitchen  CLONAZEPAM 2 MG PO TABS Oral Take 4 mg by mouth once.      . ADULT MULTIVITAMIN W/MINERALS CH Oral Take 1 tablet by mouth daily.      Marland Kitchen RISPERIDONE 0.25 MG PO TABS Oral Take 0.25 mg by mouth 2 (two) times daily.      . TRETINOIN 0.025 % EX CREA  APPLY TOPICALLY AT BEDTIME. 20 g 0  . VITAMIN C 500 MG PO TABS Oral Take 500 mg by mouth daily.      Marland Kitchen VITAMIN E 400 UNITS PO CAPS Oral Take 400 Units by mouth daily.        BP 99/63  Pulse 99  Temp(Src) 99.6 F (37.6 C) (Oral)  Resp 18  SpO2 98%  LMP 03/08/2011  Physical Exam  Nursing note and vitals reviewed. Constitutional: She is oriented to person, place, and time. She appears well-developed and well-nourished.       Ill-appearing  HENT:  Head: Normocephalic and atraumatic.  Right Ear: External ear normal.  Left Ear: External ear normal.  Nose: Rhinorrhea present. Right sinus exhibits no maxillary sinus tenderness and no frontal sinus tenderness. Left sinus exhibits no maxillary sinus tenderness and no frontal sinus tenderness.  Mouth/Throat: Mucous membranes are normal. Posterior oropharyngeal erythema present. No oropharyngeal exudate or posterior oropharyngeal edema.  Eyes: Conjunctivae and EOM are normal. Pupils are equal, round, and reactive to light. Right eye exhibits no discharge. Left eye exhibits no discharge.  Neck: Normal range of motion. Neck supple. No tracheal deviation  present.  Cardiovascular: Normal rate, regular rhythm, normal heart sounds and intact distal pulses.   No murmur heard. Pulmonary/Chest: Effort normal and breath sounds normal. No stridor. No respiratory distress. She has no wheezes. She exhibits no tenderness.  Abdominal: Soft. Bowel sounds are normal. She exhibits no distension. There is no tenderness.  Musculoskeletal: Normal range of motion. She exhibits no edema and no tenderness.  Lymphadenopathy:    She has no cervical adenopathy.  Neurological: She is alert and oriented to person, place, and time.  Coordination normal.  Skin: Skin is warm and dry. No rash noted.  Psychiatric: Her behavior is normal.    ED Course  Procedures (including critical care time)  Labs Reviewed - No data to display No results found.   Dx 1: Influenza   MDM  Influenza symptoms x 3 days. Supportive tx measures discussed with pt. Initially tachycardic but normal VS after administration of APAP (cannot take ibuprofen as has an ulcer previously dx). Lungs CTAB with good O2 sat, do not suspect pneumonia. Bilateral TM without erythema, no indication of OM. Pharynx without edema or exudate, do not suspect strep.        Shaaron Adler, [place] 03/26/11 [address] [place], [place] 03/26/11 1539

## 2011-03-26 NOTE — ED Notes (Signed)
Wolfe, PA at bedside.

## 2011-03-26 NOTE — ED Notes (Signed)
Pt asking for cough medication. Artis Flock, PA notified

## 2011-03-26 NOTE — ED Notes (Signed)
Pt believes she has flu. Body aches, cough, runny nose. Denies n/v.

## 2011-03-28 NOTE — ED Provider Notes (Signed)
Medical screening examination/treatment/procedure(s) were performed by non-physician practitioner and as supervising physician I was immediately available for consultation/collaboration.  Cyndra Numbers, MD 03/28/11 [phone]

## 2011-04-09 DIAGNOSIS — F3112 Bipolar disorder, current episode manic without psychotic features, moderate: Secondary | ICD-10-CM | POA: Diagnosis not present

## 2011-05-12 DIAGNOSIS — F3112 Bipolar disorder, current episode manic without psychotic features, moderate: Secondary | ICD-10-CM | POA: Diagnosis not present

## 2011-06-10 DIAGNOSIS — F3112 Bipolar disorder, current episode manic without psychotic features, moderate: Secondary | ICD-10-CM | POA: Diagnosis not present

## 2011-06-11 DIAGNOSIS — F3112 Bipolar disorder, current episode manic without psychotic features, moderate: Secondary | ICD-10-CM | POA: Diagnosis not present

## 2011-07-01 DIAGNOSIS — F3132 Bipolar disorder, current episode depressed, moderate: Secondary | ICD-10-CM | POA: Diagnosis not present

## 2011-07-02 DIAGNOSIS — F3162 Bipolar disorder, current episode mixed, moderate: Secondary | ICD-10-CM | POA: Diagnosis not present

## 2011-07-16 DIAGNOSIS — F3162 Bipolar disorder, current episode mixed, moderate: Secondary | ICD-10-CM | POA: Diagnosis not present

## 2011-07-28 ENCOUNTER — Telehealth (INDEPENDENT_AMBULATORY_CARE_PROVIDER_SITE_OTHER): Payer: Self-pay | Admitting: General Surgery

## 2011-07-28 NOTE — Telephone Encounter (Signed)
Past pt of Dr. 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  She self-referred to the breast center for new lump in the Rt breast.  She noted it 2 months ago, and now it's slightly enflamed.  The [facility] is requiring a referral.  She has no PCP or OB-GYN to do this.  Can Dr. Johna Sheriff call a referral for her, please?

## 2011-07-30 DIAGNOSIS — F3162 Bipolar disorder, current episode mixed, moderate: Secondary | ICD-10-CM | POA: Diagnosis not present

## 2011-07-31 ENCOUNTER — Other Ambulatory Visit (INDEPENDENT_AMBULATORY_CARE_PROVIDER_SITE_OTHER): Payer: Self-pay

## 2011-07-31 ENCOUNTER — Telehealth (INDEPENDENT_AMBULATORY_CARE_PROVIDER_SITE_OTHER): Payer: Self-pay

## 2011-07-31 DIAGNOSIS — F3161 Bipolar disorder, current episode mixed, mild: Secondary | ICD-10-CM | POA: Diagnosis not present

## 2011-07-31 DIAGNOSIS — N63 Unspecified lump in unspecified breast: Secondary | ICD-10-CM

## 2011-07-31 NOTE — Telephone Encounter (Signed)
Left voice message for Catherine Olsen to contact our office re: Referral to [facility].  Referral approved per Dr. Johna Sheriff, need more information to complete referral.

## 2011-08-05 DIAGNOSIS — F3162 Bipolar disorder, current episode mixed, moderate: Secondary | ICD-10-CM | POA: Diagnosis not present

## 2011-08-13 DIAGNOSIS — F3162 Bipolar disorder, current episode mixed, moderate: Secondary | ICD-10-CM | POA: Diagnosis not present

## 2011-08-20 DIAGNOSIS — F3162 Bipolar disorder, current episode mixed, moderate: Secondary | ICD-10-CM | POA: Diagnosis not present

## 2011-08-25 DIAGNOSIS — F4310 Post-traumatic stress disorder, unspecified: Secondary | ICD-10-CM | POA: Diagnosis not present

## 2011-08-25 DIAGNOSIS — F3132 Bipolar disorder, current episode depressed, moderate: Secondary | ICD-10-CM | POA: Diagnosis not present

## 2011-08-27 DIAGNOSIS — F3162 Bipolar disorder, current episode mixed, moderate: Secondary | ICD-10-CM | POA: Diagnosis not present

## 2011-09-04 DIAGNOSIS — F3162 Bipolar disorder, current episode mixed, moderate: Secondary | ICD-10-CM | POA: Diagnosis not present

## 2011-09-12 DIAGNOSIS — F3162 Bipolar disorder, current episode mixed, moderate: Secondary | ICD-10-CM | POA: Diagnosis not present

## 2011-09-15 DIAGNOSIS — F3162 Bipolar disorder, current episode mixed, moderate: Secondary | ICD-10-CM | POA: Diagnosis not present

## 2011-09-18 DIAGNOSIS — F3162 Bipolar disorder, current episode mixed, moderate: Secondary | ICD-10-CM | POA: Diagnosis not present

## 2011-09-22 DIAGNOSIS — F3162 Bipolar disorder, current episode mixed, moderate: Secondary | ICD-10-CM | POA: Diagnosis not present

## 2011-09-29 DIAGNOSIS — F3162 Bipolar disorder, current episode mixed, moderate: Secondary | ICD-10-CM | POA: Diagnosis not present

## 2011-09-30 DIAGNOSIS — F3132 Bipolar disorder, current episode depressed, moderate: Secondary | ICD-10-CM | POA: Diagnosis not present

## 2011-09-30 DIAGNOSIS — F4310 Post-traumatic stress disorder, unspecified: Secondary | ICD-10-CM | POA: Diagnosis not present

## 2011-10-01 ENCOUNTER — Ambulatory Visit
Admission: RE | Admit: 2011-10-01 | Discharge: 2011-10-01 | Disposition: A | Discharge status: Home / Self Care | Payer: Medicare Other | Source: Ambulatory Visit | Attending: General Surgery | Admitting: General Surgery

## 2011-10-01 ENCOUNTER — Other Ambulatory Visit (INDEPENDENT_AMBULATORY_CARE_PROVIDER_SITE_OTHER): Payer: Self-pay | Admitting: General Surgery

## 2011-10-01 DIAGNOSIS — N63 Unspecified lump in unspecified breast: Secondary | ICD-10-CM | POA: Diagnosis not present

## 2011-10-01 DIAGNOSIS — N6009 Solitary cyst of unspecified breast: Secondary | ICD-10-CM | POA: Diagnosis not present

## 2011-10-06 DIAGNOSIS — F3132 Bipolar disorder, current episode depressed, moderate: Secondary | ICD-10-CM | POA: Diagnosis not present

## 2011-10-06 DIAGNOSIS — F4310 Post-traumatic stress disorder, unspecified: Secondary | ICD-10-CM | POA: Diagnosis not present

## 2011-10-14 DIAGNOSIS — F4310 Post-traumatic stress disorder, unspecified: Secondary | ICD-10-CM | POA: Diagnosis not present

## 2011-10-14 DIAGNOSIS — F3132 Bipolar disorder, current episode depressed, moderate: Secondary | ICD-10-CM | POA: Diagnosis not present

## 2011-10-20 DIAGNOSIS — F4310 Post-traumatic stress disorder, unspecified: Secondary | ICD-10-CM | POA: Diagnosis not present

## 2011-10-20 DIAGNOSIS — F3132 Bipolar disorder, current episode depressed, moderate: Secondary | ICD-10-CM | POA: Diagnosis not present

## 2011-10-25 ENCOUNTER — Other Ambulatory Visit: Payer: Self-pay | Admitting: Internal Medicine

## 2011-10-27 DIAGNOSIS — F4310 Post-traumatic stress disorder, unspecified: Secondary | ICD-10-CM | POA: Diagnosis not present

## 2011-10-27 DIAGNOSIS — F3132 Bipolar disorder, current episode depressed, moderate: Secondary | ICD-10-CM | POA: Diagnosis not present

## 2011-10-27 NOTE — Telephone Encounter (Signed)
Is this our current patient?

## 2011-10-29 ENCOUNTER — Encounter (HOSPITAL_COMMUNITY): Payer: Self-pay | Admitting: *Deleted

## 2011-10-29 ENCOUNTER — Emergency Department (HOSPITAL_COMMUNITY)
Admission: EM | Admit: 2011-10-29 | Discharge: 2011-10-29 | Disposition: A | Discharge status: Home / Self Care | Payer: Medicare Other | Attending: Emergency Medicine | Admitting: Emergency Medicine

## 2011-10-29 ENCOUNTER — Emergency Department (HOSPITAL_COMMUNITY): Payer: Medicare Other

## 2011-10-29 DIAGNOSIS — W010XXA Fall on same level from slipping, tripping and stumbling without subsequent striking against object, initial encounter: Secondary | ICD-10-CM | POA: Insufficient documentation

## 2011-10-29 DIAGNOSIS — J45909 Unspecified asthma, uncomplicated: Secondary | ICD-10-CM | POA: Insufficient documentation

## 2011-10-29 DIAGNOSIS — F17200 Nicotine dependence, unspecified, uncomplicated: Secondary | ICD-10-CM | POA: Insufficient documentation

## 2011-10-29 DIAGNOSIS — M25529 Pain in unspecified elbow: Secondary | ICD-10-CM | POA: Diagnosis not present

## 2011-10-29 DIAGNOSIS — IMO0002 Reserved for concepts with insufficient information to code with codable children: Secondary | ICD-10-CM | POA: Diagnosis not present

## 2011-10-29 DIAGNOSIS — S5000XA Contusion of unspecified elbow, initial encounter: Secondary | ICD-10-CM | POA: Diagnosis not present

## 2011-10-29 DIAGNOSIS — M79609 Pain in unspecified limb: Secondary | ICD-10-CM | POA: Diagnosis not present

## 2011-10-29 DIAGNOSIS — J309 Allergic rhinitis, unspecified: Secondary | ICD-10-CM | POA: Diagnosis not present

## 2011-10-29 DIAGNOSIS — IMO0001 Reserved for inherently not codable concepts without codable children: Secondary | ICD-10-CM | POA: Diagnosis not present

## 2011-10-29 DIAGNOSIS — S5001XA Contusion of right elbow, initial encounter: Secondary | ICD-10-CM

## 2011-10-29 NOTE — ED Notes (Signed)
Pt c/o right arm pain; fell yesterday while jogging; pain at elbow

## 2011-10-29 NOTE — ED Provider Notes (Signed)
History     CSN: [id number]  Arrival date & time 10/29/11  0055   First MD Initiated Contact with Patient 10/29/11 0226      Chief Complaint  Patient presents with  . Arm Injury    HPI  History provided by the patient. Patient is a 41 year old female who presents with complaints of right elbow pain and injury after a fall. Patient states she was jogging yesterday and tripped landing on her elbow. Patient also had abrasions to left palm of hand. Patient states she was slightly concerned she may have done more damage than she originally thought that she continues to have pain especially with straightening her elbow completely. She denies any numbness or tingling down her hand or arm. She denies any head injury from fall or LOC. She denies any neck pains.    Past Medical History  Diagnosis Date  . Asthma   . Fibromyalgia     History reviewed. No pertinent past surgical history.  No family history on file.  History  Substance Use Topics  . Smoking status: Current Everyday Smoker -- 1.0 packs/day for 26 years    Types: Cigarettes  . Smokeless tobacco: Not on file  . Alcohol Use: Yes     occasionally    OB History    Grav Para Term Preterm Abortions TAB SAB Ect Mult Living                  Review of Systems  HENT: Negative for neck pain.   Musculoskeletal: Positive for joint swelling.  Neurological: Negative for weakness, light-headedness, numbness and headaches.    Allergies  Sulfonamide derivatives  Home Medications   Current Outpatient Rx  Name Route Sig Dispense Refill  . VITAMIN D 1000 UNITS PO TABS Oral Take 1,000 Units by mouth daily.      Marland Kitchen CLONAZEPAM 2 MG PO TABS Oral Take 4 mg by mouth once.      . ADULT MULTIVITAMIN W/MINERALS CH Oral Take 1 tablet by mouth daily.      Marland Kitchen RISPERIDONE 0.25 MG PO TABS Oral Take 0.25 mg by mouth 2 (two) times daily.      . TRETINOIN 0.025 % EX CREA  APPLY TOPICALLY AT BEDTIME. 20 g 0  . VITAMIN C 500 MG PO TABS Oral Take  500 mg by mouth daily.      Marland Kitchen VITAMIN E 400 UNITS PO CAPS Oral Take 400 Units by mouth daily.        BP 109/76  Pulse 72  Temp 98.3 F (36.8 C)  Resp 20  SpO2 98%  LMP 10/20/2011  Physical Exam  Nursing note and vitals reviewed. Constitutional: She is oriented to person, place, and time. She appears well-developed and well-nourished. No distress.  HENT:  Head: Normocephalic.  Cardiovascular: Normal rate and regular rhythm.   Pulmonary/Chest: Effort normal and breath sounds normal.  Musculoskeletal: Normal range of motion.       Mild swelling and bruising with small abrasion to posterior right elbow. Full range of motion of elbow. No gross deformity. Normal distal pulses, grip strength and sensations.  Neurological: She is alert and oriented to person, place, and time.  Skin: Skin is warm and dry.  Psychiatric: She has a normal mood and affect. Her behavior is normal.    ED Course  Procedures      Dg Elbow Complete Right  10/29/2011  *RADIOLOGY REPORT*  Clinical Data: Fall with arm injury and abrasions and pain to the  olecranon region of the elbow.  RIGHT ELBOW - COMPLETE 3+ VIEW  Comparison: None.  Findings: No evidence of acute fracture, dislocation or joint effusion.  Soft tissues are unremarkable.  No bony lesions are identified.  IMPRESSION: Normal right elbow.  Original Report Authenticated By: Reola Calkins, M.D.     1. Contusion of elbow, right       MDM  Patient seen and evaluated. X-rays are unremarkable without signs for fracture or dislocation. Patient with mild swelling and abrasion to elbow. Full range of motion and neurovascularly intact.     Phill Mutter [place], [place] 10/29/11 [phone]

## 2011-10-29 NOTE — ED Provider Notes (Signed)
Medical screening examination/treatment/procedure(s) were performed by non-physician practitioner and as supervising physician I was immediately available for consultation/collaboration.  Crystalynn Mcinerney M Kimbely Whiteaker, MD 10/29/11 0844 

## 2011-11-05 DIAGNOSIS — F3132 Bipolar disorder, current episode depressed, moderate: Secondary | ICD-10-CM | POA: Diagnosis not present

## 2011-11-05 DIAGNOSIS — F4310 Post-traumatic stress disorder, unspecified: Secondary | ICD-10-CM | POA: Diagnosis not present

## 2011-11-10 DIAGNOSIS — F4310 Post-traumatic stress disorder, unspecified: Secondary | ICD-10-CM | POA: Diagnosis not present

## 2011-11-10 DIAGNOSIS — F3132 Bipolar disorder, current episode depressed, moderate: Secondary | ICD-10-CM | POA: Diagnosis not present

## 2011-11-18 DIAGNOSIS — F4310 Post-traumatic stress disorder, unspecified: Secondary | ICD-10-CM | POA: Diagnosis not present

## 2011-11-18 DIAGNOSIS — F3132 Bipolar disorder, current episode depressed, moderate: Secondary | ICD-10-CM | POA: Diagnosis not present

## 2011-11-24 DIAGNOSIS — F4310 Post-traumatic stress disorder, unspecified: Secondary | ICD-10-CM | POA: Diagnosis not present

## 2011-11-24 DIAGNOSIS — F3132 Bipolar disorder, current episode depressed, moderate: Secondary | ICD-10-CM | POA: Diagnosis not present

## 2011-11-27 NOTE — Telephone Encounter (Signed)
No PCP listed.

## 2011-12-02 DIAGNOSIS — F3132 Bipolar disorder, current episode depressed, moderate: Secondary | ICD-10-CM | POA: Diagnosis not present

## 2011-12-02 DIAGNOSIS — F4310 Post-traumatic stress disorder, unspecified: Secondary | ICD-10-CM | POA: Diagnosis not present

## 2011-12-09 DIAGNOSIS — F3132 Bipolar disorder, current episode depressed, moderate: Secondary | ICD-10-CM | POA: Diagnosis not present

## 2011-12-09 DIAGNOSIS — F4310 Post-traumatic stress disorder, unspecified: Secondary | ICD-10-CM | POA: Diagnosis not present

## 2011-12-12 DIAGNOSIS — F4310 Post-traumatic stress disorder, unspecified: Secondary | ICD-10-CM | POA: Diagnosis not present

## 2011-12-12 DIAGNOSIS — F3132 Bipolar disorder, current episode depressed, moderate: Secondary | ICD-10-CM | POA: Diagnosis not present

## 2011-12-15 DIAGNOSIS — F3132 Bipolar disorder, current episode depressed, moderate: Secondary | ICD-10-CM | POA: Diagnosis not present

## 2011-12-15 DIAGNOSIS — F4310 Post-traumatic stress disorder, unspecified: Secondary | ICD-10-CM | POA: Diagnosis not present

## 2011-12-18 DIAGNOSIS — F3132 Bipolar disorder, current episode depressed, moderate: Secondary | ICD-10-CM | POA: Diagnosis not present

## 2011-12-18 DIAGNOSIS — F4310 Post-traumatic stress disorder, unspecified: Secondary | ICD-10-CM | POA: Diagnosis not present

## 2011-12-22 DIAGNOSIS — F4310 Post-traumatic stress disorder, unspecified: Secondary | ICD-10-CM | POA: Diagnosis not present

## 2011-12-22 DIAGNOSIS — F3132 Bipolar disorder, current episode depressed, moderate: Secondary | ICD-10-CM | POA: Diagnosis not present

## 2011-12-30 DIAGNOSIS — F4310 Post-traumatic stress disorder, unspecified: Secondary | ICD-10-CM | POA: Diagnosis not present

## 2011-12-30 DIAGNOSIS — F3112 Bipolar disorder, current episode manic without psychotic features, moderate: Secondary | ICD-10-CM | POA: Diagnosis not present

## 2012-01-06 DIAGNOSIS — F3112 Bipolar disorder, current episode manic without psychotic features, moderate: Secondary | ICD-10-CM | POA: Diagnosis not present

## 2012-01-06 DIAGNOSIS — F4310 Post-traumatic stress disorder, unspecified: Secondary | ICD-10-CM | POA: Diagnosis not present

## 2012-01-07 DIAGNOSIS — F4310 Post-traumatic stress disorder, unspecified: Secondary | ICD-10-CM | POA: Diagnosis not present

## 2012-01-07 DIAGNOSIS — F3132 Bipolar disorder, current episode depressed, moderate: Secondary | ICD-10-CM | POA: Diagnosis not present

## 2012-01-11 ENCOUNTER — Emergency Department (HOSPITAL_COMMUNITY)
Admission: EM | Admit: 2012-01-11 | Discharge: 2012-01-11 | Disposition: A | Discharge status: Home / Self Care | Payer: Medicare Other | Attending: Emergency Medicine | Admitting: Emergency Medicine

## 2012-01-11 ENCOUNTER — Encounter (HOSPITAL_COMMUNITY): Payer: Self-pay | Admitting: Emergency Medicine

## 2012-01-11 DIAGNOSIS — F17200 Nicotine dependence, unspecified, uncomplicated: Secondary | ICD-10-CM | POA: Diagnosis not present

## 2012-01-11 DIAGNOSIS — L299 Pruritus, unspecified: Secondary | ICD-10-CM | POA: Diagnosis not present

## 2012-01-11 DIAGNOSIS — L509 Urticaria, unspecified: Secondary | ICD-10-CM | POA: Diagnosis not present

## 2012-01-11 MED ORDER — FAMOTIDINE 20 MG PO TABS: 20.0000 mg | ORAL_TABLET | Freq: Two times a day (BID) | ORAL | Status: DC

## 2012-01-11 MED ORDER — HYDROXYZINE HCL 25 MG PO TABS: 25.0000 mg | ORAL_TABLET | Freq: Four times a day (QID) | ORAL | Status: DC

## 2012-01-11 NOTE — ED Notes (Signed)
Pt reports increased red raised  rash and itching on forehead and hands Has taken benadryl every 4 hrs, no decrease in sx. Denies difficulty breathing . Pt had haircut at 1030. Sx started 1 hr post haircut

## 2012-01-11 NOTE — ED Provider Notes (Signed)
History     CSN: [id number]  Arrival date & time 01/11/12  1142   First MD Initiated Contact with Patient 01/11/12 1216      Chief Complaint  Patient presents with  . Rash  . Allergic Reaction    itching and rash on forehead and hands x 24 hrs    (Consider location/radiation/quality/duration/timing/severity/associated sxs/prior treatment) Patient is a 41 y.o. female presenting with rash. The history is provided by the patient.  Rash  This is a new problem. The current episode started yesterday. The problem has been gradually worsening. The problem is associated with an unknown factor. There has been no fever. The rash is present on the scalp and face. The patient is experiencing no pain. Associated symptoms include itching. She has tried antihistamines for the symptoms. The treatment provided no relief.  PT states she has hives all over her scalp and forehead since yesterday. No change in products, shampoo, facial soap or lotion. States symptoms started shortly after she got a Tour manager. Pt states she did not get her hair shampooed or colored. States rash is very itchy. Denies swelling of lips, tongue, throat. No respiratory problems.   Past Medical History  Diagnosis Date  . Asthma   . Fibromyalgia     Past Surgical History  Procedure Date  . Other surgical history     Family History  Problem Relation Age of Onset  . Diabetes Mother   . Hypertension Mother     History  Substance Use Topics  . Smoking status: Current Every Day Smoker -- 1.0 packs/day for 26 years    Types: Cigarettes  . Smokeless tobacco: Not on file  . Alcohol Use: Yes     occasionally    OB History    Grav Para Term Preterm Abortions TAB SAB Ect Mult Living                  Review of Systems  Constitutional: Negative for fever and chills.  HENT: Negative for sore throat, facial swelling, trouble swallowing, neck pain and neck stiffness.   Eyes: Negative for redness and itching.  Respiratory:  Negative.   Cardiovascular: Negative.   Skin: Positive for itching and rash.  Neurological: Negative for weakness and numbness.    Allergies  Sulfonamide derivatives and Other  Home Medications   Current Outpatient Rx  Name Route Sig Dispense Refill  . CELECOXIB 200 MG PO CAPS Oral Take 200 mg by mouth daily as needed. For pain.    Marland Kitchen CLONAZEPAM 2 MG PO TABS Oral Take 2 mg by mouth at bedtime.     Marland Kitchen DIPHENHYDRAMINE HCL 25 MG PO TABS Oral Take 50 mg by mouth every 6 (six) hours as needed. For hives.    . ADULT MULTIVITAMIN W/MINERALS CH Oral Take 1 tablet by mouth daily.      BP 95/70  Pulse 72  Temp 98 F (36.7 C) (Oral)  Resp 20  Wt 140 lb (63.504 kg)  SpO2 99%  LMP 12/28/2011  Physical Exam  Nursing note and vitals reviewed. Constitutional: She is oriented to person, place, and time. She appears well-developed and well-nourished. No distress.  HENT:  Head: Normocephalic and atraumatic.  Right Ear: External ear normal.  Left Ear: External ear normal.  Mouth/Throat: Oropharynx is clear and moist. No oropharyngeal exudate.       TMs normal bilaterally  Eyes: Conjunctivae normal are normal.  Neck: Neck supple.  Cardiovascular: Normal rate, regular rhythm and normal heart sounds.  Pulmonary/Chest: Effort normal and breath sounds normal. No respiratory distress. She has no wheezes. She has no rales.  Neurological: She is alert and oriented to person, place, and time.  Skin: Skin is warm and dry.       Hives over entire scalp and forehead. No rash anywhere else on the body.   Psychiatric: She has a normal mood and affect.    ED Course  Procedures (including critical care time)  Pt with hives over face and scalp. Hx of the same, this time no change in products, but did get a haircut just prior to the syptoms starting. Suspect contact dermatitis. Pt states unable to take prednisone because it makes her "crazy." state last time required benadryl, vistaril, pepcid.   1.  Hives       MDM          Lottie Mussel, PA 01/11/12 1606

## 2012-01-11 NOTE — ED Notes (Deleted)
Has been seen here for allergic rxn this week-- symptoms returned last night, eyes swollen, hives on arms, legs, trunk, face, neck- no resp distress

## 2012-01-12 ENCOUNTER — Encounter (HOSPITAL_COMMUNITY): Payer: Self-pay | Admitting: Emergency Medicine

## 2012-01-12 ENCOUNTER — Emergency Department (HOSPITAL_COMMUNITY)
Admission: EM | Admit: 2012-01-12 | Discharge: 2012-01-12 | Disposition: A | Discharge status: Home / Self Care | Payer: Medicare Other | Attending: Emergency Medicine | Admitting: Emergency Medicine

## 2012-01-12 DIAGNOSIS — IMO0001 Reserved for inherently not codable concepts without codable children: Secondary | ICD-10-CM | POA: Insufficient documentation

## 2012-01-12 DIAGNOSIS — J45909 Unspecified asthma, uncomplicated: Secondary | ICD-10-CM | POA: Insufficient documentation

## 2012-01-12 DIAGNOSIS — L509 Urticaria, unspecified: Secondary | ICD-10-CM | POA: Insufficient documentation

## 2012-01-12 DIAGNOSIS — T7800XA Anaphylactic reaction due to unspecified food, initial encounter: Secondary | ICD-10-CM | POA: Diagnosis not present

## 2012-01-12 DIAGNOSIS — Z882 Allergy status to sulfonamides status: Secondary | ICD-10-CM | POA: Insufficient documentation

## 2012-01-12 DIAGNOSIS — R109 Unspecified abdominal pain: Secondary | ICD-10-CM | POA: Diagnosis not present

## 2012-01-12 DIAGNOSIS — L299 Pruritus, unspecified: Secondary | ICD-10-CM | POA: Diagnosis not present

## 2012-01-12 DIAGNOSIS — R062 Wheezing: Secondary | ICD-10-CM | POA: Diagnosis not present

## 2012-01-12 DIAGNOSIS — R059 Cough, unspecified: Secondary | ICD-10-CM | POA: Diagnosis not present

## 2012-01-12 DIAGNOSIS — R05 Cough: Secondary | ICD-10-CM | POA: Diagnosis not present

## 2012-01-12 DIAGNOSIS — F17200 Nicotine dependence, unspecified, uncomplicated: Secondary | ICD-10-CM | POA: Diagnosis not present

## 2012-01-12 MED ORDER — HYDROXYZINE HCL 25 MG PO TABS
25.0000 mg | ORAL_TABLET | Freq: Once | ORAL | Status: AC
  Administered 2012-01-12: 25 mg via ORAL
  Filled 2012-01-12: qty 1

## 2012-01-12 MED ORDER — DIPHENHYDRAMINE HCL 25 MG PO TABS: 25.0000 mg | ORAL_TABLET | Freq: Four times a day (QID) | ORAL | Status: DC | PRN

## 2012-01-12 MED ORDER — HYDROXYZINE HCL 25 MG PO TABS: 25.0000 mg | ORAL_TABLET | Freq: Four times a day (QID) | ORAL | Status: DC

## 2012-01-12 MED ORDER — PREDNISONE 50 MG PO TABS: ORAL_TABLET | ORAL | Status: DC

## 2012-01-12 MED ORDER — DIPHENHYDRAMINE HCL 25 MG PO CAPS
25.0000 mg | ORAL_CAPSULE | Freq: Four times a day (QID) | ORAL | Status: DC | PRN
  Administered 2012-01-12: 25 mg via ORAL
  Filled 2012-01-12: qty 1

## 2012-01-12 NOTE — ED Notes (Addendum)
PT. REPORTS PERSISTENT / PROGRESSING ITCHY HIVES AT SCALP /FOREHEAD AND HANDS FOR SEVERAL DAYS SEEN AND TREATED [facility] YESTERDAY PRESCRIBED WITH ATARAX AND PEPCID WITH NO IMPROVEMENT , RESPIRATIONS UNLABORED / AIRWAY INTACT.

## 2012-01-12 NOTE — ED Provider Notes (Signed)
History     CSN: [id number]  Arrival date & time 01/12/12  0610   First MD Initiated Contact with Patient 01/12/12 [phone]      Chief Complaint  Patient presents with  . Urticaria    (Consider location/radiation/quality/duration/timing/severity/associated sxs/prior treatment) HPI Comments: Patient presents with complaint of hives and left hand swelling. Patient developed rash after getting her hair done. Patient seen in ED yesterday and given Hydroxyzine and Pepcid. Patient declined prednisone taper as she has bipolar disorder and not on psychiatric medication. Per patient "steroids make me manic". Patient presents today stating that her rash has improved but she is still itchy. Denies fever or chills. Denies NVD or abdominal pain. Denies SOB, wheezing or difficulty swallowing.  The history is provided by the patient.    Past Medical History  Diagnosis Date  . Asthma   . Fibromyalgia     Past Surgical History  Procedure Date  . Other surgical history     Family History  Problem Relation Age of Onset  . Diabetes Mother   . Hypertension Mother     History  Substance Use Topics  . Smoking status: Current Every Day Smoker -- 1.0 packs/day for 26 years    Types: Cigarettes  . Smokeless tobacco: Not on file  . Alcohol Use: Yes     occasionally    OB History    Grav Para Term Preterm Abortions TAB SAB Ect Mult Living                  Review of Systems  Constitutional: Negative for fever and chills.  HENT: Negative for trouble swallowing.   Respiratory: Negative for shortness of breath and wheezing.   Gastrointestinal: Negative for nausea, vomiting, abdominal pain and diarrhea.  Skin: Positive for rash.    Allergies  Sulfonamide derivatives and Other  Home Medications   Current Outpatient Rx  Name Route Sig Dispense Refill  . CELECOXIB 200 MG PO CAPS Oral Take 200 mg by mouth daily as needed. For pain.    Marland Kitchen CLONAZEPAM 2 MG PO TABS Oral Take 2 mg by mouth at  bedtime.     Marland Kitchen DIPHENHYDRAMINE HCL 25 MG PO TABS Oral Take 50 mg by mouth every 6 (six) hours as needed. For hives.    Marland Kitchen FAMOTIDINE 20 MG PO TABS Oral Take 1 tablet (20 mg total) by mouth 2 (two) times daily. 30 tablet 0  . HYDROXYZINE HCL 25 MG PO TABS Oral Take 1 tablet (25 mg total) by mouth every 6 (six) hours. 12 tablet 0  . ADULT MULTIVITAMIN W/MINERALS CH Oral Take 1 tablet by mouth daily.    Marland Kitchen DIPHENHYDRAMINE HCL 25 MG PO TABS Oral Take 1 tablet (25 mg total) by mouth every 6 (six) hours as needed for itching (Rash). 30 tablet 0  . HYDROXYZINE HCL 25 MG PO TABS Oral Take 1 tablet (25 mg total) by mouth every 6 (six) hours. 30 tablet 0  . PREDNISONE 50 MG PO TABS  Take 1 tablet once daily for 5 days 5 tablet 0    BP 107/67  Pulse 63  Temp 98.1 F (36.7 C) (Oral)  Resp 18  SpO2 100%  LMP 12/28/2011  Physical Exam  Nursing note and vitals reviewed. Constitutional: She appears well-developed and well-nourished. No distress.  HENT:  Head: Normocephalic and atraumatic.  Mouth/Throat: Oropharynx is clear and moist.  Eyes: Conjunctivae normal and EOM are normal. No scleral icterus.  Neck: Normal range of motion. Neck  supple.  Cardiovascular: Normal rate, regular rhythm and normal heart sounds.   Pulmonary/Chest: Effort normal and breath sounds normal. She has no wheezes.  Abdominal: Soft. Bowel sounds are normal. There is no tenderness.  Neurological: She is alert.  Skin: Skin is warm and dry.       ED Course  Procedures (including critical care time)  Labs Reviewed - No data to display No results found.   1. Hives       MDM  Patient presented with rash and left hand swelling. Patient given benadryl and hydroxyzine with minimal improvement. After talking patient agreed to take the Rx for prednisone and then contact her psychiatrist to prevent mania. Patient states that she will also follow-up with her allergist. Patient discharged with Rx for 5 day course of  prednisone, hydroxyzine, and benadryl. Return precautions given. Patient has no red flags for SJS, TENS, or system anaphylaxis.          Pixie Casino, PA-C 01/12/12 [phone]

## 2012-01-13 NOTE — ED Provider Notes (Signed)
Medical screening examination/treatment/procedure(s) were performed by non-physician practitioner and as supervising physician I was immediately available for consultation/collaboration.  Reesha Debes, MD 01/13/12 0637 

## 2012-01-14 NOTE — ED Provider Notes (Signed)
Medical screening examination/treatment/procedure(s) were performed by non-physician practitioner and as supervising physician I was immediately available for consultation/collaboration.  Journe Hallmark L Kolby Myung, MD 01/14/12 0718 

## 2012-01-19 DIAGNOSIS — L509 Urticaria, unspecified: Secondary | ICD-10-CM | POA: Diagnosis not present

## 2012-01-20 DIAGNOSIS — F4310 Post-traumatic stress disorder, unspecified: Secondary | ICD-10-CM | POA: Diagnosis not present

## 2012-01-20 DIAGNOSIS — F3112 Bipolar disorder, current episode manic without psychotic features, moderate: Secondary | ICD-10-CM | POA: Diagnosis not present

## 2012-01-23 DIAGNOSIS — M545 Low back pain, unspecified: Secondary | ICD-10-CM | POA: Diagnosis not present

## 2012-01-23 DIAGNOSIS — Z110 Encounter for screening for intestinal infectious diseases: Secondary | ICD-10-CM | POA: Diagnosis not present

## 2012-01-23 DIAGNOSIS — M2550 Pain in unspecified joint: Secondary | ICD-10-CM | POA: Diagnosis not present

## 2012-01-23 DIAGNOSIS — R6889 Other general symptoms and signs: Secondary | ICD-10-CM | POA: Diagnosis not present

## 2012-01-23 DIAGNOSIS — Z79899 Other long term (current) drug therapy: Secondary | ICD-10-CM | POA: Diagnosis not present

## 2012-01-23 DIAGNOSIS — R5381 Other malaise: Secondary | ICD-10-CM | POA: Diagnosis not present

## 2012-01-23 DIAGNOSIS — M25569 Pain in unspecified knee: Secondary | ICD-10-CM | POA: Diagnosis not present

## 2012-01-23 DIAGNOSIS — M25549 Pain in joints of unspecified hand: Secondary | ICD-10-CM | POA: Diagnosis not present

## 2012-01-24 ENCOUNTER — Emergency Department (HOSPITAL_COMMUNITY)
Admission: EM | Admit: 2012-01-24 | Discharge: 2012-01-24 | Discharge status: Against Medical Advice | Payer: Medicare Other

## 2012-01-24 ENCOUNTER — Emergency Department (INDEPENDENT_AMBULATORY_CARE_PROVIDER_SITE_OTHER)
Admission: EM | Admit: 2012-01-24 | Discharge: 2012-01-24 | Disposition: A | Discharge status: Home / Self Care | Payer: Medicare Other | Source: Home / Self Care | Attending: Emergency Medicine | Admitting: Emergency Medicine

## 2012-01-24 ENCOUNTER — Encounter (HOSPITAL_COMMUNITY): Payer: Self-pay | Admitting: Emergency Medicine

## 2012-01-24 DIAGNOSIS — T148XXA Other injury of unspecified body region, initial encounter: Secondary | ICD-10-CM | POA: Diagnosis not present

## 2012-01-24 NOTE — ED Notes (Signed)
Pt was having blood work done and has a blown vein and is very concerned.

## 2012-01-26 DIAGNOSIS — L509 Urticaria, unspecified: Secondary | ICD-10-CM | POA: Diagnosis not present

## 2012-01-26 DIAGNOSIS — L500 Allergic urticaria: Secondary | ICD-10-CM | POA: Diagnosis not present

## 2012-01-26 DIAGNOSIS — J45909 Unspecified asthma, uncomplicated: Secondary | ICD-10-CM | POA: Diagnosis not present

## 2012-01-27 DIAGNOSIS — F4310 Post-traumatic stress disorder, unspecified: Secondary | ICD-10-CM | POA: Diagnosis not present

## 2012-01-27 DIAGNOSIS — F3112 Bipolar disorder, current episode manic without psychotic features, moderate: Secondary | ICD-10-CM | POA: Diagnosis not present

## 2012-01-27 NOTE — ED Provider Notes (Signed)
History     CSN: [id number]  Arrival date & time 01/24/12  1843   First MD Initiated Contact with Patient 01/24/12 1952      Chief Complaint  Patient presents with  . Arm Injury    (Consider location/radiation/quality/duration/timing/severity/associated sxs/prior treatment) The history is provided by the patient.  patient reports she had blood drawn yesterday for labwork.  States the tech "missed" her vein.  Presents today with concern for bruising to left arm, concern for arterial damage.  Past Medical History  Diagnosis Date  . Asthma   . Fibromyalgia     Past Surgical History  Procedure Date  . Other surgical history     Family History  Problem Relation Age of Onset  . Diabetes Mother   . Hypertension Mother     History  Substance Use Topics  . Smoking status: Current Every Day Smoker -- 1.0 packs/day for 26 years    Types: Cigarettes  . Smokeless tobacco: Not on file  . Alcohol Use: Yes     occasionally    OB History    Grav Para Term Preterm Abortions TAB SAB Ect Mult Living                  Review of Systems  Constitutional: Negative.   Respiratory: Negative.   Cardiovascular: Negative.   Musculoskeletal: Negative.   Skin: Positive for color change.    Allergies  Sulfonamide derivatives and Other  Home Medications   Current Outpatient Rx  Name Route Sig Dispense Refill  . CELECOXIB 200 MG PO CAPS Oral Take 200 mg by mouth daily as needed. For pain.    Marland Kitchen CLONAZEPAM 2 MG PO TABS Oral Take 2 mg by mouth at bedtime.     Marland Kitchen DIPHENHYDRAMINE HCL 25 MG PO TABS Oral Take 50 mg by mouth every 6 (six) hours as needed. For hives.    Marland Kitchen DIPHENHYDRAMINE HCL 25 MG PO TABS Oral Take 1 tablet (25 mg total) by mouth every 6 (six) hours as needed for itching (Rash). 30 tablet 0  . FAMOTIDINE 20 MG PO TABS Oral Take 1 tablet (20 mg total) by mouth 2 (two) times daily. 30 tablet 0  . HYDROXYZINE HCL 25 MG PO TABS Oral Take 1 tablet (25 mg total) by mouth every 6  (six) hours. 12 tablet 0  . HYDROXYZINE HCL 25 MG PO TABS Oral Take 1 tablet (25 mg total) by mouth every 6 (six) hours. 30 tablet 0  . ADULT MULTIVITAMIN W/MINERALS CH Oral Take 1 tablet by mouth daily.    Marland Kitchen PREDNISONE 50 MG PO TABS  Take 1 tablet once daily for 5 days 5 tablet 0    BP 111/75  Pulse 73  Temp 98.2 F (36.8 C) (Oral)  Resp 18  SpO2 100%  LMP 12/28/2011  Physical Exam  Nursing note and vitals reviewed. Constitutional: She is oriented to person, place, and time. Vital signs are normal. She appears well-developed and well-nourished. She is active and cooperative.  HENT:  Head: Normocephalic.  Eyes: Conjunctivae normal are normal. Pupils are equal, round, and reactive to light. No scleral icterus.  Neck: Trachea normal. Neck supple.  Cardiovascular: Normal rate and regular rhythm.   Pulmonary/Chest: Effort normal and breath sounds normal.  Neurological: She is alert and oriented to person, place, and time. No cranial nerve deficit or sensory deficit.  Skin: Skin is warm and dry. Bruising noted.     Psychiatric: She has a normal mood and affect.  Her speech is normal and behavior is normal. Judgment and thought content normal. Cognition and memory are normal.    ED Course  Procedures (including critical care time)  Labs Reviewed - No data to display No results found.   1. Bruising       MDM  Discussed with patient this is normal expectations of bruising.  No arterial damage noted.        Johnsie Kindred, NP 01/27/12 1733

## 2012-01-29 NOTE — ED Provider Notes (Signed)
Medical screening examination/treatment/procedure(s) were performed by non-physician practitioner and as supervising physician I was immediately available for consultation/collaboration.  Leslee Home, M.D.   Reuben Likes, MD 01/29/12 [phone]

## 2012-01-30 DIAGNOSIS — M25549 Pain in joints of unspecified hand: Secondary | ICD-10-CM | POA: Diagnosis not present

## 2012-01-30 DIAGNOSIS — M25569 Pain in unspecified knee: Secondary | ICD-10-CM | POA: Diagnosis not present

## 2012-01-30 DIAGNOSIS — M545 Low back pain, unspecified: Secondary | ICD-10-CM | POA: Diagnosis not present

## 2012-02-02 DIAGNOSIS — J309 Allergic rhinitis, unspecified: Secondary | ICD-10-CM | POA: Diagnosis not present

## 2012-02-03 DIAGNOSIS — F3112 Bipolar disorder, current episode manic without psychotic features, moderate: Secondary | ICD-10-CM | POA: Diagnosis not present

## 2012-02-03 DIAGNOSIS — F4310 Post-traumatic stress disorder, unspecified: Secondary | ICD-10-CM | POA: Diagnosis not present

## 2012-02-09 DIAGNOSIS — F4310 Post-traumatic stress disorder, unspecified: Secondary | ICD-10-CM | POA: Diagnosis not present

## 2012-02-09 DIAGNOSIS — F3132 Bipolar disorder, current episode depressed, moderate: Secondary | ICD-10-CM | POA: Diagnosis not present

## 2012-02-11 DIAGNOSIS — F4310 Post-traumatic stress disorder, unspecified: Secondary | ICD-10-CM | POA: Diagnosis not present

## 2012-02-11 DIAGNOSIS — F3132 Bipolar disorder, current episode depressed, moderate: Secondary | ICD-10-CM | POA: Diagnosis not present

## 2012-02-17 DIAGNOSIS — F4310 Post-traumatic stress disorder, unspecified: Secondary | ICD-10-CM | POA: Diagnosis not present

## 2012-02-17 DIAGNOSIS — F3132 Bipolar disorder, current episode depressed, moderate: Secondary | ICD-10-CM | POA: Diagnosis not present

## 2012-02-19 DIAGNOSIS — F4310 Post-traumatic stress disorder, unspecified: Secondary | ICD-10-CM | POA: Diagnosis not present

## 2012-02-19 DIAGNOSIS — F3112 Bipolar disorder, current episode manic without psychotic features, moderate: Secondary | ICD-10-CM | POA: Diagnosis not present

## 2012-03-02 DIAGNOSIS — F4310 Post-traumatic stress disorder, unspecified: Secondary | ICD-10-CM | POA: Diagnosis not present

## 2012-03-02 DIAGNOSIS — F3112 Bipolar disorder, current episode manic without psychotic features, moderate: Secondary | ICD-10-CM | POA: Diagnosis not present

## 2012-03-03 ENCOUNTER — Emergency Department (INDEPENDENT_AMBULATORY_CARE_PROVIDER_SITE_OTHER)
Admission: EM | Admit: 2012-03-03 | Discharge: 2012-03-03 | Disposition: A | Discharge status: Home / Self Care | Payer: Medicare Other | Source: Home / Self Care | Attending: Family Medicine | Admitting: Family Medicine

## 2012-03-03 ENCOUNTER — Other Ambulatory Visit (HOSPITAL_COMMUNITY)
Admission: RE | Admit: 2012-03-03 | Discharge: 2012-03-03 | Disposition: A | Discharge status: Home / Self Care | Payer: Medicare Other | Source: Ambulatory Visit | Attending: Family Medicine | Admitting: Family Medicine

## 2012-03-03 ENCOUNTER — Encounter (HOSPITAL_COMMUNITY): Payer: Self-pay | Admitting: Emergency Medicine

## 2012-03-03 DIAGNOSIS — Z113 Encounter for screening for infections with a predominantly sexual mode of transmission: Secondary | ICD-10-CM

## 2012-03-03 LAB — RPR: RPR Ser Ql: NONREACTIVE

## 2012-03-03 NOTE — ED Notes (Signed)
Reports cycle is late.  Patient states she is fatigue, depress and wants to rule out pregnancy.    Patient doesn't have PCP.   Patient wants hormone level checked.

## 2012-03-03 NOTE — ED Provider Notes (Signed)
History     CSN: [id number]  Arrival date & time 03/03/12  4098   First MD Initiated Contact with Patient 03/03/12 1106      Chief Complaint  Patient presents with  . SEXUALLY TRANSMITTED DISEASE    (Consider location/radiation/quality/duration/timing/severity/associated sxs/prior treatment) HPI Comments: 41 year old female with history of asthma, fibromyalgia, smoker. Here concerned about being late in her menstrual cycle. States she missed her period over 2 weeks ago. Denies pelvic pain or vaginal discharge. She noticed a boil in her labia majora and "expressed some pus from it" over a week ago but is completely healed now. Also she had issues with fatigue and wants to rule out pregnancy. She had occasional unprotected sex last time over a month ago, also wants to be checked for syphilis, gonorrhea and Chlamydia. Patient reports that she was checked for HIV one year ago and does not want to be rechecked for this condition today. Denies breast tenderness. No nausea or vomiting. No dizziness. No abdominal pain. No dysuria.   Past Medical History  Diagnosis Date  . Asthma   . Fibromyalgia     Past Surgical History  Procedure Date  . Other surgical history     Family History  Problem Relation Age of Onset  . Diabetes Mother   . Hypertension Mother     History  Substance Use Topics  . Smoking status: Current Every Day Smoker -- 1.0 packs/day for 26 years    Types: Cigarettes  . Smokeless tobacco: Not on file  . Alcohol Use: Yes     Comment: occasionally    OB History    Grav Para Term Preterm Abortions TAB SAB Ect Mult Living                  Review of Systems  Constitutional: Positive for fatigue. Negative for fever and chills.  Gastrointestinal: Negative for nausea, vomiting and abdominal pain.  Genitourinary: Negative for dysuria, urgency, hematuria, flank pain, vaginal bleeding, vaginal discharge and pelvic pain.  Skin: Negative for rash.  Neurological:  Negative for dizziness and headaches.    Allergies  Sulfonamide derivatives and Other  Home Medications   Current Outpatient Rx  Name  Route  Sig  Dispense  Refill  . ASPIRIN 325 MG PO TABS   Oral   Take 325 mg by mouth every 4 (four) hours as needed.         . CELECOXIB 200 MG PO CAPS   Oral   Take 200 mg by mouth daily as needed. For pain.         Marland Kitchen CLONAZEPAM 2 MG PO TABS   Oral   Take 2 mg by mouth at bedtime.          Marland Kitchen DIPHENHYDRAMINE HCL 25 MG PO TABS   Oral   Take 50 mg by mouth every 6 (six) hours as needed. For hives.         Marland Kitchen FAMOTIDINE 20 MG PO TABS   Oral   Take 1 tablet (20 mg total) by mouth 2 (two) times daily.   30 tablet   0   . HYDROXYZINE HCL 25 MG PO TABS   Oral   Take 1 tablet (25 mg total) by mouth every 6 (six) hours.   12 tablet   0   . ADULT MULTIVITAMIN W/MINERALS CH   Oral   Take 1 tablet by mouth daily.         Marland Kitchen DIPHENHYDRAMINE HCL 25 MG PO TABS  Oral   Take 1 tablet (25 mg total) by mouth every 6 (six) hours as needed for itching (Rash).   30 tablet   0   . HYDROXYZINE HCL 25 MG PO TABS   Oral   Take 1 tablet (25 mg total) by mouth every 6 (six) hours.   30 tablet   0   . PREDNISONE 50 MG PO TABS      Take 1 tablet once daily for 5 days   5 tablet   0     BP 118/67  Pulse 71  Temp 98 F (36.7 C) (Oral)  Resp 16  SpO2 100%  Physical Exam  Nursing note and vitals reviewed. Constitutional: She is oriented to person, place, and time. She appears well-developed and well-nourished. No distress.  HENT:  Head: Normocephalic.  Eyes: Conjunctivae normal are normal. No scleral icterus.  Neck: No thyromegaly present.  Cardiovascular: Normal heart sounds.   Pulmonary/Chest: Breath sounds normal.  Abdominal: Soft. Bowel sounds are normal. She exhibits no distension and no mass. There is no tenderness. There is no rebound and no guarding. Hernia confirmed negative in the right inguinal area and confirmed  negative in the left inguinal area.  Genitourinary: Vagina normal and uterus normal. No breast swelling, tenderness, discharge or bleeding. There is no rash, tenderness, lesion or injury on the right labia. There is no rash, tenderness, lesion or injury on the left labia. Cervix exhibits no motion tenderness, no discharge and no friability. Right adnexum displays no mass, no tenderness and no fullness. Left adnexum displays no mass, no tenderness and no fullness. No erythema or bleeding around the vagina.  Lymphadenopathy:    She has no cervical adenopathy.       Right: No inguinal adenopathy present.       Left: No inguinal adenopathy present.  Neurological: She is alert and oriented to person, place, and time.  Skin: No rash noted. She is not diaphoretic.    ED Course  Procedures (including critical care time)   Labs Reviewed  POCT PREGNANCY, URINE  RPR  CERVICOVAGINAL ANCILLARY ONLY   No results found.   1. Screening for STD (sexually transmitted disease)       MDM  Normal physical exam including pelvic exam. Negative urine pregnancy. GC, Chlamydia and RPR pending. No prescriptions today. Encouraged consistent condom use.        Sharin Grave, MD 03/05/12 [phone]

## 2012-03-08 DIAGNOSIS — E2839 Other primary ovarian failure: Secondary | ICD-10-CM | POA: Diagnosis not present

## 2012-03-08 DIAGNOSIS — N926 Irregular menstruation, unspecified: Secondary | ICD-10-CM | POA: Diagnosis not present

## 2012-03-08 DIAGNOSIS — Z01419 Encounter for gynecological examination (general) (routine) without abnormal findings: Secondary | ICD-10-CM | POA: Diagnosis not present

## 2012-03-08 NOTE — ED Notes (Signed)
After verifying ID, was able to review negative labs

## 2012-03-09 DIAGNOSIS — F4310 Post-traumatic stress disorder, unspecified: Secondary | ICD-10-CM | POA: Diagnosis not present

## 2012-03-09 DIAGNOSIS — N926 Irregular menstruation, unspecified: Secondary | ICD-10-CM | POA: Diagnosis not present

## 2012-03-09 DIAGNOSIS — F3132 Bipolar disorder, current episode depressed, moderate: Secondary | ICD-10-CM | POA: Diagnosis not present

## 2012-03-11 DIAGNOSIS — N926 Irregular menstruation, unspecified: Secondary | ICD-10-CM | POA: Diagnosis not present

## 2012-03-23 DIAGNOSIS — N970 Female infertility associated with anovulation: Secondary | ICD-10-CM | POA: Diagnosis not present

## 2012-03-23 DIAGNOSIS — N912 Amenorrhea, unspecified: Secondary | ICD-10-CM | POA: Diagnosis not present

## 2012-04-01 ENCOUNTER — Emergency Department (HOSPITAL_COMMUNITY): Payer: Medicare Other

## 2012-04-01 ENCOUNTER — Encounter (HOSPITAL_COMMUNITY): Payer: Self-pay | Admitting: *Deleted

## 2012-04-01 ENCOUNTER — Emergency Department (HOSPITAL_COMMUNITY)
Admission: EM | Admit: 2012-04-01 | Discharge: 2012-04-01 | Disposition: A | Discharge status: Home / Self Care | Payer: Medicare Other | Attending: Emergency Medicine | Admitting: Emergency Medicine

## 2012-04-01 DIAGNOSIS — R109 Unspecified abdominal pain: Secondary | ICD-10-CM | POA: Insufficient documentation

## 2012-04-01 DIAGNOSIS — F17200 Nicotine dependence, unspecified, uncomplicated: Secondary | ICD-10-CM | POA: Insufficient documentation

## 2012-04-01 DIAGNOSIS — IMO0001 Reserved for inherently not codable concepts without codable children: Secondary | ICD-10-CM | POA: Diagnosis not present

## 2012-04-01 DIAGNOSIS — J45909 Unspecified asthma, uncomplicated: Secondary | ICD-10-CM | POA: Insufficient documentation

## 2012-04-01 DIAGNOSIS — K824 Cholesterolosis of gallbladder: Secondary | ICD-10-CM | POA: Diagnosis not present

## 2012-04-01 DIAGNOSIS — Z79899 Other long term (current) drug therapy: Secondary | ICD-10-CM | POA: Diagnosis not present

## 2012-04-01 DIAGNOSIS — N926 Irregular menstruation, unspecified: Secondary | ICD-10-CM | POA: Diagnosis not present

## 2012-04-01 DIAGNOSIS — R197 Diarrhea, unspecified: Secondary | ICD-10-CM | POA: Diagnosis not present

## 2012-04-01 DIAGNOSIS — R631 Polydipsia: Secondary | ICD-10-CM | POA: Insufficient documentation

## 2012-04-01 LAB — URINALYSIS, ROUTINE W REFLEX MICROSCOPIC
Bilirubin Urine: NEGATIVE
Glucose, UA: NEGATIVE mg/dL
Hgb urine dipstick: NEGATIVE
Ketones, ur: NEGATIVE mg/dL
Leukocytes, UA: NEGATIVE
Nitrite: NEGATIVE
Protein, ur: NEGATIVE mg/dL
Specific Gravity, Urine: 1.006 (ref 1.005–1.030)
Urobilinogen, UA: 0.2 mg/dL (ref 0.0–1.0)
pH: 6 (ref 5.0–8.0)

## 2012-04-01 LAB — COMPREHENSIVE METABOLIC PANEL
ALT: 20 U/L (ref 0–35)
AST: 16 U/L (ref 0–37)
Albumin: 4.3 g/dL (ref 3.5–5.2)
Alkaline Phosphatase: 59 U/L (ref 39–117)
BUN: 10 mg/dL (ref 6–23)
CO2: 24 mEq/L (ref 19–32)
Calcium: 9.2 mg/dL (ref 8.4–10.5)
Chloride: 99 mEq/L (ref 96–112)
Creatinine, Ser: 0.67 mg/dL (ref 0.50–1.10)
GFR calc Af Amer: >90 mL/min (ref >90)
GFR calc non Af Amer: >90 mL/min (ref >90)
Glucose, Bld: 96 mg/dL (ref 70–99)
Potassium: 3.8 mEq/L (ref 3.5–5.1)
Sodium: 132 mEq/L — ABNORMAL LOW (ref 135–145)
Total Bilirubin: 0.4 mg/dL (ref 0.3–1.2)
Total Protein: 7.4 g/dL (ref 6.0–8.3)

## 2012-04-01 LAB — CBC WITH DIFFERENTIAL/PLATELET
Basophils Absolute: 0 10*3/uL (ref 0.0–0.1)
Basophils Relative: 0 % (ref 0–1)
Eosinophils Absolute: 0.1 10*3/uL (ref 0.0–0.7)
Eosinophils Relative: 1 % (ref 0–5)
HCT: 45.6 % (ref 36.0–46.0)
Hemoglobin: 16.3 g/dL — ABNORMAL HIGH (ref 12.0–15.0)
Lymphocytes Relative: 14 % (ref 12–46)
Lymphs Abs: 1.3 10*3/uL (ref 0.7–4.0)
MCH: 33.1 pg (ref 26.0–34.0)
MCHC: 35.7 g/dL (ref 30.0–36.0)
MCV: 92.7 fL (ref 78.0–100.0)
Monocytes Absolute: 0.4 10*3/uL (ref 0.1–1.0)
Monocytes Relative: 4 % (ref 3–12)
Neutro Abs: 7.2 10*3/uL (ref 1.7–7.7)
Neutrophils Relative %: 80 % — ABNORMAL HIGH (ref 43–77)
Platelets: 246 10*3/uL (ref 150–400)
RBC: 4.92 MIL/uL (ref 3.87–5.11)
RDW: 13.4 % (ref 11.5–15.5)
WBC: 9.1 10*3/uL (ref 4.0–10.5)

## 2012-04-01 LAB — LIPASE, BLOOD: Lipase: 47 U/L (ref 11–59)

## 2012-04-01 LAB — PREGNANCY, URINE: Preg Test, Ur: NEGATIVE

## 2012-04-01 MED ORDER — SODIUM CHLORIDE 0.9 % IV BOLUS (SEPSIS): 1000.0000 mL | Freq: Once | INTRAVENOUS | Status: DC

## 2012-04-01 NOTE — ED Notes (Signed)
Pt is refusing IV site start and is refusing normal saline bolus. Pt did consent to lab draws only.

## 2012-04-01 NOTE — ED Provider Notes (Signed)
History     CSN: [id number]  Arrival date & time 04/01/12  0507   First MD Initiated Contact with Patient 04/01/12 [phone]      No chief complaint on file.   (Consider location/radiation/quality/duration/timing/severity/associated sxs/prior treatment) HPI Patient presents to the emergency department with increased thirst and muscle tension in her upper abdomen.  Patient, states that these have been ongoing problem for quite a while, but has recently flared back up.  Patient, states she seen her gynecologist, a rheumatologist, allergist, immunologist as scheduled to see another rheumatologist at [facility].  Patient, states she's also had irregular menstrual cycles the last 3 months.  Patient, states that her OB/GYN, that she may need birth control.  Patient, states that she also had a salt craving.  She feels, like she needs to eat salt.  Patient, states she's had this happen previously, several years ago.  Patient, states that her Vegan diet must be masking her symptoms.  Patient denies fevers, chest pain, shortness of breath, headache, visual changes, weakness, vomiting, nausea, diarrhea dizziness or syncope.  Patient, states that she didn't take anything for her symptoms prior to arrival.  Patient, states that nothing seems to make her symptoms better or worse. Past Medical History  Diagnosis Date  . Asthma   . Fibromyalgia     Past Surgical History  Procedure Date  . Other surgical history     Family History  Problem Relation Age of Onset  . Diabetes Mother   . Hypertension Mother     History  Substance Use Topics  . Smoking status: Current Every Day Smoker -- 1.0 packs/day for 26 years    Types: Cigarettes  . Smokeless tobacco: Not on file  . Alcohol Use: Yes     Comment: occasionally    OB History    Grav Para Term Preterm Abortions TAB SAB Ect Mult Living                  Review of Systems All other systems negative except as documented in the HPI. All  pertinent positives and negatives as reviewed in the HPI.  Allergies  Sulfonamide derivatives and Other  Home Medications   Current Outpatient Rx  Name  Route  Sig  Dispense  Refill  . ALBUTEROL SULFATE HFA 108 (90 BASE) MCG/ACT IN AERS   Inhalation   Inhale 2 puffs into the lungs every 6 (six) hours as needed. Shortness of breath         . ASPIRIN 325 MG PO TABS   Oral   Take 325 mg by mouth every 4 (four) hours as needed. Pain         . CALCIUM CARBONATE-VITAMIN D 500-200 MG-UNIT PO TABS   Oral   Take 1 tablet by mouth every morning.         . CELECOXIB 200 MG PO CAPS   Oral   Take 200 mg by mouth daily as needed. For pain.         Marland Kitchen CLONAZEPAM 2 MG PO TABS   Oral   Take 2 mg by mouth at bedtime.          Marland Kitchen EPINEPHRINE 0.3 MG/0.3ML IJ DEVI   Intramuscular   Inject 0.3 mg into the muscle once.         . ADULT MULTIVITAMIN W/MINERALS CH   Oral   Take 1 tablet by mouth every evening.            BP 150/86  Pulse 78  Temp 97.7 F (36.5 C)  Resp 20  SpO2 100%  LMP 01/23/2012  Physical Exam  Nursing note and vitals reviewed. Constitutional: She is oriented to person, place, and time. She appears well-developed and well-nourished. No distress.  HENT:  Head: Normocephalic and atraumatic.  Mouth/Throat: Oropharynx is clear and moist.  Eyes: Pupils are equal, round, and reactive to light.  Neck: Normal range of motion. Neck supple.  Cardiovascular: Normal rate, regular rhythm and normal heart sounds.   Pulmonary/Chest: Effort normal and breath sounds normal.  Neurological: She is alert and oriented to person, place, and time. She exhibits normal muscle tone. Coordination normal.  Skin: Skin is warm and dry. No rash noted.  Psychiatric: Her mood appears anxious.    ED Course  Procedures (including critical care time)  Labs Reviewed  CBC WITH DIFFERENTIAL - Abnormal; Notable for the following:    Hemoglobin 16.3 (*)     Neutrophils Relative 80  (*)     All other components within normal limits  COMPREHENSIVE METABOLIC PANEL - Abnormal; Notable for the following:    Sodium 132 (*)     All other components within normal limits  URINALYSIS, ROUTINE W REFLEX MICROSCOPIC  PREGNANCY, URINE  LIPASE, BLOOD   US Abdomen Complete  04/01/2012  *RADIOLOGY REPORT*  Clinical Data:  Upper abdominal pain, history asthma, fibromyalgia  ULTRASOUND ABDOMEN:  Technique:  Sonography of upper abdominal structures was performed.  Comparison:  None  Gallbladder:  Tiny gallbladder polyp.  Normally distended without stones, wall thickening or pericholecystic fluid.  No sonographic Murphy's sign.  Common bile duct:  Normal caliber 4 mm diameter  Liver:  No focal mass or nodularity.  Normal echogenicity.  IVC:  Normal appearance.  Pancreas:  Normal appearance  Spleen:  Normal appearance, 6.4 cm length  Right kidney:  12.0 cm length. Normal morphology without mass or hydronephrosis.  Left kidney:  11.3 cm length. Normal morphology without mass or hydronephrosis.  Aorta:  Normal caliber  Other:  No free fluid  IMPRESSION: Tiny gallbladder polyp. No significant upper abdominal sonographic abnormalities.   Original Report Authenticated By: Ulyses Southward, M.D.     I spoke with patient at great length about her laboratory testing, and ultrasound.  Patient seems very anxious and concerned about her symptoms.  Patient, is, advised she'll need to followup with the specialist that she is currently scheduled to see.  Advised patient to return here for any worsening in her condition.  I told the patient that her symptoms don't clearly point to 1 specific disease process or condition.  I told her that her testing results today did not show any significant abnormalities.   MDM         Carlyle Dolly, PA-C 04/01/12 [phone]

## 2012-04-01 NOTE — ED Provider Notes (Signed)
Medical screening examination/treatment/procedure(s) were performed by non-physician practitioner and as supervising physician I was immediately available for consultation/collaboration.   Lyanne Co, MD 04/01/12 [phone]

## 2012-04-01 NOTE — ED Notes (Addendum)
Pt c/o a clenching, cramping pain in her upper abdomen. Said she has stopped having her period and is having cravings. Increased UO. Went to gyn and was offered birth control, which she didn't take. Also, went to endocrinologist who told her it was no menopausal based on her estrogen levels. Pt also c/o chronic loose stools.

## 2012-04-01 NOTE — ED Notes (Signed)
Pt c/o lower back pain; upper abdominal clenching; no menstrual cycle for three months; craving water/salt/sugar; has been evaluated by endocrinologist and gynecologist with no definitive diagnosis

## 2012-04-04 ENCOUNTER — Inpatient Hospital Stay (HOSPITAL_COMMUNITY): Payer: Medicare Other

## 2012-04-04 ENCOUNTER — Encounter (HOSPITAL_COMMUNITY): Payer: Self-pay | Admitting: Obstetrics and Gynecology

## 2012-04-04 ENCOUNTER — Inpatient Hospital Stay (HOSPITAL_COMMUNITY)
Admission: AD | Admit: 2012-04-04 | Discharge: 2012-04-04 | Disposition: A | Discharge status: Home / Self Care | Payer: Medicare Other | Source: Ambulatory Visit | Attending: Obstetrics & Gynecology | Admitting: Obstetrics & Gynecology

## 2012-04-04 DIAGNOSIS — D251 Intramural leiomyoma of uterus: Secondary | ICD-10-CM | POA: Insufficient documentation

## 2012-04-04 DIAGNOSIS — R1032 Left lower quadrant pain: Secondary | ICD-10-CM | POA: Diagnosis not present

## 2012-04-04 DIAGNOSIS — N83209 Unspecified ovarian cyst, unspecified side: Secondary | ICD-10-CM | POA: Diagnosis not present

## 2012-04-04 DIAGNOSIS — N949 Unspecified condition associated with female genital organs and menstrual cycle: Secondary | ICD-10-CM | POA: Diagnosis not present

## 2012-04-04 DIAGNOSIS — D259 Leiomyoma of uterus, unspecified: Secondary | ICD-10-CM | POA: Diagnosis not present

## 2012-04-04 LAB — URINALYSIS, ROUTINE W REFLEX MICROSCOPIC
Bilirubin Urine: NEGATIVE
Leukocytes, UA: NEGATIVE
Nitrite: NEGATIVE
Specific Gravity, Urine: 1.025 (ref 1.005–1.030)
pH: 5.5 (ref 5.0–8.0)

## 2012-04-04 LAB — POCT PREGNANCY, URINE: Preg Test, Ur: NEGATIVE

## 2012-04-04 NOTE — MAU Provider Note (Signed)
History     CSN: [id number]  Arrival date and time: 04/04/12 4098   First Provider Initiated Contact with Patient 04/04/12 [phone]      Chief Complaint  Patient presents with  . Pelvic Pain   HPI [name] is a 41 y.o. G2 P55 female who presents to MAU with complaint of "ovarian pain." The pain has been there for "months" but has recently gotten worse. She has also had nausea and diarrhea during this time which has been worse the last few days. She has not had a period in 2 1/2 months. Her main complaint today is the left sided pelvic pain.The patient feels as though there is something growing and "expanding" and the pain is getting worse and radiating to her back sometimes. The patient does not like to take pain medication. She is concerned that this might be cancer or something bad.   OB History    Grav Para Term Preterm Abortions TAB SAB Ect Mult Living   2    2 2     0      Past Medical History  Diagnosis Date  . Asthma   . Fibromyalgia     Past Surgical History  Procedure Date  . Other surgical history     Family History  Problem Relation Age of Onset  . Diabetes Mother   . Hypertension Mother     History  Substance Use Topics  . Smoking status: Current Every Day Smoker -- 1.0 packs/day for 26 years    Types: Cigarettes  . Smokeless tobacco: Not on file  . Alcohol Use: Yes     Comment: occasionally    Allergies:  Allergies  Allergen Reactions  . Other Anxiety    steroids    Prescriptions prior to admission  Medication Sig Dispense Refill  . aspirin 325 MG tablet Take 325 mg by mouth every 4 (four) hours as needed. Pain      . calcium-vitamin D (OSCAL WITH D) 500-200 MG-UNIT per tablet Take 1 tablet by mouth every morning.      . clonazePAM (KLONOPIN) 2 MG tablet Take 2 mg by mouth at bedtime.       . Multiple Vitamin (MULTIVITAMIN WITH MINERALS) TABS Take 1 tablet by mouth every evening.       Marland Kitchen albuterol (PROVENTIL HFA;VENTOLIN HFA) 108 (90 BASE)  MCG/ACT inhaler Inhale 2 puffs into the lungs every 6 (six) hours as needed. Shortness of breath      . EPINEPHrine (EPIPEN) 0.3 mg/0.3 mL DEVI Inject 0.3 mg into the muscle once.        ROS All negative unless otherwise noted in HPI  Physical Exam   Blood pressure 116/81, pulse 82, temperature 97.8 F (36.6 C), temperature source Oral, resp. rate 18, height 5\' 8"  (1.727 m), weight 139 lb 12.8 oz (63.413 kg), last menstrual period 01/23/2012.  Physical Exam  Constitutional: She is oriented to person, place, and time. She appears well-developed and well-nourished. No distress.  HENT:  Head: Normocephalic and atraumatic.  Cardiovascular: Normal rate, regular rhythm and normal heart sounds.   Respiratory: Effort normal and breath sounds normal.  GI: Soft. Bowel sounds are normal. She exhibits no distension and no mass. There is no tenderness. There is no rebound and no guarding.  Neurological: She is alert and oriented to person, place, and time.  Skin: Skin is warm and dry. No erythema.  Psychiatric: Her mood appears anxious.   Results for orders placed during the hospital encounter  of 04/04/12 (from the past 24 hour(s))  URINALYSIS, ROUTINE W REFLEX MICROSCOPIC     Status: Abnormal   Collection Time   04/04/12  7:20 AM      Component Value Range   Color, Urine YELLOW  YELLOW   APPearance CLEAR  CLEAR   Specific Gravity, Urine 1.025  1.005 - 1.030   pH 5.5  5.0 - 8.0   Glucose, UA NEGATIVE  NEGATIVE mg/dL   Hgb urine dipstick NEGATIVE  NEGATIVE   Bilirubin Urine NEGATIVE  NEGATIVE   Ketones, ur 15 (*) NEGATIVE mg/dL   Protein, ur NEGATIVE  NEGATIVE mg/dL   Urobilinogen, UA 0.2  0.0 - 1.0 mg/dL   Nitrite NEGATIVE  NEGATIVE   Leukocytes, UA NEGATIVE  NEGATIVE  POCT PREGNANCY, URINE     Status: Normal   Collection Time   04/04/12  7:30 AM      Component Value Range   Preg Test, Ur NEGATIVE  NEGATIVE    MAU Course  Procedures *RADIOLOGY REPORT*   Clinical Data: Left lower  quadrant pain   TRANSABDOMINAL AND TRANSVAGINAL ULTRASOUND OF PELVIS  Technique: Both transabdominal and transvaginal ultrasound  examinations of the pelvis were performed. Transabdominal  technique was performed for global imaging of the pelvis including  uterus, ovaries, adnexal regions, and pelvic cul-de-sac.  It was necessary to proceed with endovaginal exam following the  transabdominal exam to visualize the uterus and ovaries.  Comparison: 12/24/2008   Findings:  Uterus: Measures 8 x 4.1 x 6.1 cm. Well-circumscribed hypo echoic  mass within the anterior myometrium measures 1.3 x 0.9 x 1.0 cm.  This is well circumscribed and is favored to represent an  intramural fibroid.   Endometrium: Normal in thickness and appearance. Measures 12 mm.   Right ovary: Normal appearance/no adnexal mass   Left ovary: Normal appearance/no adnexal mass. There is a 4.1 x  2.4 x 3.6 cm cyst. No mural nodularity or septations identified.  Other Findings: No free fluid   IMPRESSION:  1. Anterior fibroid.  2. Left ovarian cyst. This is almost certainly benign, and no  specific imaging follow up is recommended according to the Society  of Radiologists in Ultrasound 2010 Consensus Conference Statement  (D Lenis Noon et al. Management of Asymptomatic Ovarian and Other  Adnexal Cysts Imaged at [place]: Society of Radiologists in Ultrasound  Consensus Conference Statement 2010. Radiology 256 (Sept 2010):  943-954.).   Original Report Authenticated By: Signa Kell, M.D.   MDM Discussed patient with Dr. Langston Masker. Would like pelvic US today to evaluate patient's pain.  Based on above [place] results. Dr. Langston Masker recommends patient use NSAIDs PRN for pain and call to schedule follow-up in the office.  Assessment and Plan  A: 1. Left ovarian cyst 2. Small anterior intramural fibroid  P: Discharge home Patient instructed to take OTC NSAIDs PRN for pain.  Discussed possibilities for progression/resolution of  ovarian cyst and information printed in AVS Patient instructed to call [facility] to cancel previously scheduled [place] and make follow-up appointment to discuss amenorrhea   Freddi Starr, PA-C 04/04/2012, 10:05 AM

## 2012-04-04 NOTE — MAU Note (Addendum)
Pt reports having left ovarian pain with nausea and diarrhea. For several weeks.  Has not had her period for  3 monthsPt having multiple of complaints.

## 2012-04-06 DIAGNOSIS — F3132 Bipolar disorder, current episode depressed, moderate: Secondary | ICD-10-CM | POA: Diagnosis not present

## 2012-04-06 DIAGNOSIS — F4310 Post-traumatic stress disorder, unspecified: Secondary | ICD-10-CM | POA: Diagnosis not present

## 2012-04-08 DIAGNOSIS — L680 Hirsutism: Secondary | ICD-10-CM | POA: Diagnosis not present

## 2012-04-08 DIAGNOSIS — N926 Irregular menstruation, unspecified: Secondary | ICD-10-CM | POA: Diagnosis not present

## 2012-04-09 DIAGNOSIS — N926 Irregular menstruation, unspecified: Secondary | ICD-10-CM | POA: Diagnosis not present

## 2012-05-18 DIAGNOSIS — F4310 Post-traumatic stress disorder, unspecified: Secondary | ICD-10-CM | POA: Diagnosis not present

## 2012-05-18 DIAGNOSIS — F3132 Bipolar disorder, current episode depressed, moderate: Secondary | ICD-10-CM | POA: Diagnosis not present

## 2012-05-25 DIAGNOSIS — M545 Low back pain, unspecified: Secondary | ICD-10-CM | POA: Diagnosis not present

## 2012-05-25 DIAGNOSIS — IMO0002 Reserved for concepts with insufficient information to code with codable children: Secondary | ICD-10-CM | POA: Diagnosis not present

## 2012-05-25 DIAGNOSIS — M4120 Other idiopathic scoliosis, site unspecified: Secondary | ICD-10-CM | POA: Diagnosis not present

## 2012-05-25 DIAGNOSIS — M5126 Other intervertebral disc displacement, lumbar region: Secondary | ICD-10-CM | POA: Diagnosis not present

## 2012-06-29 DIAGNOSIS — F4310 Post-traumatic stress disorder, unspecified: Secondary | ICD-10-CM | POA: Diagnosis not present

## 2012-06-29 DIAGNOSIS — F3161 Bipolar disorder, current episode mixed, mild: Secondary | ICD-10-CM | POA: Diagnosis not present

## 2012-07-26 DIAGNOSIS — G4700 Insomnia, unspecified: Secondary | ICD-10-CM | POA: Diagnosis not present

## 2012-07-26 DIAGNOSIS — F32A Depression, unspecified: Secondary | ICD-10-CM | POA: Insufficient documentation

## 2012-07-26 DIAGNOSIS — IMO0001 Reserved for inherently not codable concepts without codable children: Secondary | ICD-10-CM | POA: Diagnosis not present

## 2012-07-26 DIAGNOSIS — R52 Pain, unspecified: Secondary | ICD-10-CM | POA: Diagnosis not present

## 2012-07-26 DIAGNOSIS — M25549 Pain in joints of unspecified hand: Secondary | ICD-10-CM | POA: Diagnosis not present

## 2012-07-26 DIAGNOSIS — F329 Major depressive disorder, single episode, unspecified: Secondary | ICD-10-CM | POA: Insufficient documentation

## 2012-07-26 DIAGNOSIS — G8929 Other chronic pain: Secondary | ICD-10-CM | POA: Diagnosis not present

## 2012-07-26 DIAGNOSIS — R5381 Other malaise: Secondary | ICD-10-CM | POA: Diagnosis not present

## 2012-07-26 DIAGNOSIS — F3289 Other specified depressive episodes: Secondary | ICD-10-CM | POA: Diagnosis not present

## 2012-07-26 DIAGNOSIS — R5383 Other fatigue: Secondary | ICD-10-CM | POA: Diagnosis not present

## 2012-07-29 DIAGNOSIS — IMO0001 Reserved for inherently not codable concepts without codable children: Secondary | ICD-10-CM | POA: Diagnosis not present

## 2012-08-12 ENCOUNTER — Other Ambulatory Visit (INDEPENDENT_AMBULATORY_CARE_PROVIDER_SITE_OTHER): Payer: Self-pay

## 2012-08-12 DIAGNOSIS — Z9889 Other specified postprocedural states: Secondary | ICD-10-CM

## 2012-08-20 ENCOUNTER — Telehealth (INDEPENDENT_AMBULATORY_CARE_PROVIDER_SITE_OTHER): Payer: Self-pay

## 2012-08-20 DIAGNOSIS — F319 Bipolar disorder, unspecified: Secondary | ICD-10-CM | POA: Diagnosis not present

## 2012-08-20 DIAGNOSIS — F4310 Post-traumatic stress disorder, unspecified: Secondary | ICD-10-CM | POA: Diagnosis not present

## 2012-08-20 NOTE — Telephone Encounter (Addendum)
Spoke to patient regarding breast imaging.  Patient aware that [facility] and I will call patient with appointment date & time.

## 2012-08-20 NOTE — Telephone Encounter (Signed)
Message copied by Maryan Puls on Fri Aug 20, 2012  9:43 AM ------      Message from: Rise Paganini      Created: Mon Aug 09, 2012 10:46 AM      Regarding: [place]      Contact: [phone]       Patient stated that she has had to have several biopsies and MRI-she will need to be sent over for imaging because she has an area of concern that also has pain. [facility]. She would like a phone call to let her know that the referral has been sent. Thank you. ------

## 2012-08-23 ENCOUNTER — Other Ambulatory Visit (INDEPENDENT_AMBULATORY_CARE_PROVIDER_SITE_OTHER): Payer: Self-pay | Admitting: General Surgery

## 2012-08-23 ENCOUNTER — Other Ambulatory Visit (INDEPENDENT_AMBULATORY_CARE_PROVIDER_SITE_OTHER): Payer: Self-pay

## 2012-08-23 ENCOUNTER — Telehealth (INDEPENDENT_AMBULATORY_CARE_PROVIDER_SITE_OTHER): Payer: Self-pay

## 2012-08-23 DIAGNOSIS — Z9889 Other specified postprocedural states: Secondary | ICD-10-CM

## 2012-08-23 NOTE — Telephone Encounter (Signed)
Patient given appt time and agreeable at this time.

## 2012-08-23 NOTE — Addendum Note (Signed)
Addended by: Maryan Puls on: 08/23/2012 10:21 AM   Modules accepted: Orders

## 2012-08-23 NOTE — Telephone Encounter (Signed)
Called and left message for patient to call our office.  Patient scheduled appointment for Tuesday 08/24/12 @ 2:45 pm w/Dr. Johna Sheriff.

## 2012-08-23 NOTE — Telephone Encounter (Signed)
Called [facility] regarding scheduling patient Bilateral Diagnostic Mammogram & [place].  Patient stated earlier that she was having right breast pain and concerned with a mass/lump.  Discussed with Dr. Johna Sheriff and rec'd approval to order yearly MGM & [place].  Patient due for yearly screening in June 2014.  Patient was advised that unless she's currently having an issue or problem with both breast I am unable to get approval ordering bilateral diagnostic mammogram, patient then began to state she's having issues with her left breast.  Per [facility] they advised to order Unilateral Right Diagnostic Mammogram because of patients initial complaints.  Patient need's office visit to determine if bilateral diagnostic MGM need's to be ordered.  Patient has called our office and [facility] numerous times upset because we cannot order a bilateral diagnostic mgm without direct physician order.  Patient 1st stated she was concerned with an area and pain she was having in her right breast.  After numerous calls to [facility] the patient reported back to our office that she is now having left breast concerns.  I have personally called and spoke to [facility] and because of numerous calls to our office and [facility] as well as several concerns patient is currently having, we feel that it would be best if the patient has an office visit with Dr. Johna Sheriff for further assessment and to address patient concerns. Patient offered appointment for 08/24/12 @ 2:45 pm w/Dr. Johna Sheriff.

## 2012-08-24 ENCOUNTER — Encounter (INDEPENDENT_AMBULATORY_CARE_PROVIDER_SITE_OTHER): Payer: Self-pay | Admitting: General Surgery

## 2012-08-24 ENCOUNTER — Ambulatory Visit (INDEPENDENT_AMBULATORY_CARE_PROVIDER_SITE_OTHER): Payer: Medicare Other | Admitting: General Surgery

## 2012-08-24 VITALS — BP 128/74 | HR 77 | Temp 98.0°F | Resp 18 | Ht 69.0 in | Wt 142.0 lb

## 2012-08-24 DIAGNOSIS — N6019 Diffuse cystic mastopathy of unspecified breast: Secondary | ICD-10-CM | POA: Insufficient documentation

## 2012-08-24 NOTE — Progress Notes (Signed)
Chief complaint: Bilateral breast changes  History: Patient is a 42 year old female whom I have followed for a number of years for bilateral fibrocystic breast disease. For many years she has had lumps in either breast that have come and gone. She had 2 core needle biopsies of the right breast about 2009 there were negative for malignancy. She has been noted to have very dense breast tissue. She has no family history of breast cancer. She comes in today due to some recent changes in both breasts. She has noted a tender area and a possible lump at about the 12:00 position of her right breast which is similar to where her previous biopsies were performed. She also notices numerous "marblelike" masses in the left breast which she feels is a change. No nipple discharge or bleeding or skin changes.  Past Medical History  Diagnosis Date  . Asthma   . Fibromyalgia    Past Surgical History  Procedure Laterality Date  . Other surgical history     Current Outpatient Prescriptions  Medication Sig Dispense Refill  . albuterol (PROVENTIL HFA;VENTOLIN HFA) 108 (90 BASE) MCG/ACT inhaler Inhale 2 puffs into the lungs every 6 (six) hours as needed. Shortness of breath      . aspirin 325 MG tablet Take 325 mg by mouth every 4 (four) hours as needed. Pain      . clonazePAM (KLONOPIN) 2 MG tablet Take 2 mg by mouth at bedtime.       Marland Kitchen EPINEPHrine (EPIPEN) 0.3 mg/0.3 mL DEVI Inject 0.3 mg into the muscle once.      . Multiple Vitamin (MULTIVITAMIN WITH MINERALS) TABS Take 1 tablet by mouth every evening.       . calcium-vitamin D (OSCAL WITH D) 500-200 MG-UNIT per tablet Take 1 tablet by mouth every morning.       No current facility-administered medications for this visit.   Allergies  Allergen Reactions  . Other Anxiety    steroids   History  Substance Use Topics  . Smoking status: Current Every Day Smoker -- 1.00 packs/day for 26 years    Types: Cigarettes  . Smokeless tobacco: Not on file  . Alcohol  Use: Yes     Comment: occasionally   Exam: BP 128/74  Pulse 77  Temp(Src) 98 F (36.7 C)  Resp 18  Ht 5\' 9"  (1.753 m)  Wt 142 lb (64.411 kg)  BMI 20.96 kg/m2 General: Somewhat anxious thin Caucasian female in no distress Skin: no rash or infection Lymph nodes: no cervical, supraclavicular, or axillary lymph nodes palpable Breasts: No skin changes or nipple inversion. Breast tissue was very firm and dense bilaterally. There are numerous superficial irregularities in both breasts, none of which standout. At the 12:00 position of the right breast is a somewhat more prominent rounded projection off the anterior breast tissue, possible smooth mass or fibroadenoma. It is somewhat subtle.  Imaging:  MRI 2009 showed bilateral dense enhancement without mass lesion  DIGITAL DIAGNOSTIC BILATERAL MAMMOGRAM WITH CAD AND BILATERAL  BREAST ULTRASOUND: June 2014 Comparison: 12/01/2007, 05/17/2007 and 02/16/2006  Findings: Exam demonstrates heterogeneously dense fibroglandular  tissue. There is no definite focal abnormality to correspond to  patient's palpable abnormalities bilaterally. Remainder of the  exam is unchanged.  Mammographic images were processed with CAD.  On physical exam, I palpate a very superficial mobile 1.5 cm mass  over the upper outer right breast. I palpate no definite focal  abnormality of the outer left breast in the area of patient's  palpable abnormality.  Ultrasound is performed, showing a well defined ovoid simple cyst  at the 10 o'clock position of the right breast 2 cm from the nipple  corresponding to patient's palpable abnormality. This measures 1.2  x 1.8 x 1.8 cm. Ultrasound evaluation over patient's palpable  abnormality in the outer left breast demonstrates a small cyst at  the 3 o'clock position 2-3 cm from the nipple measuring 6 x 6 x 8  mm.  IMPRESSION:  Bilateral simple cysts corresponding to patient's palpable  abnormalities.    Assessment and  plan: Severe bilateral fibrocystic disease with chronic palpable abnormalities. The prominent area in the right breast may represent a cyst seen 1 year ago as above. It has been one year since imaging and I recommended bilateral diagnostic mammogram and ultrasound. We discussed that in the future 3D mammogram might be a good choice for her dense breast tissue.  We will followup on her imaging and I encouraged her to return in one year for breast exam.

## 2012-08-25 ENCOUNTER — Ambulatory Visit
Admission: RE | Admit: 2012-08-25 | Discharge: 2012-08-25 | Disposition: A | Discharge status: Home / Self Care | Payer: Medicare Other | Source: Ambulatory Visit | Attending: General Surgery | Admitting: General Surgery

## 2012-08-25 DIAGNOSIS — N6019 Diffuse cystic mastopathy of unspecified breast: Secondary | ICD-10-CM

## 2012-08-27 DIAGNOSIS — F4310 Post-traumatic stress disorder, unspecified: Secondary | ICD-10-CM | POA: Diagnosis not present

## 2012-08-27 DIAGNOSIS — F319 Bipolar disorder, unspecified: Secondary | ICD-10-CM | POA: Diagnosis not present

## 2012-08-30 DIAGNOSIS — F319 Bipolar disorder, unspecified: Secondary | ICD-10-CM | POA: Diagnosis not present

## 2012-08-30 DIAGNOSIS — F4310 Post-traumatic stress disorder, unspecified: Secondary | ICD-10-CM | POA: Diagnosis not present

## 2012-08-31 DIAGNOSIS — F4310 Post-traumatic stress disorder, unspecified: Secondary | ICD-10-CM | POA: Diagnosis not present

## 2012-08-31 DIAGNOSIS — F3132 Bipolar disorder, current episode depressed, moderate: Secondary | ICD-10-CM | POA: Diagnosis not present

## 2012-09-04 DIAGNOSIS — F4310 Post-traumatic stress disorder, unspecified: Secondary | ICD-10-CM | POA: Diagnosis not present

## 2012-09-04 DIAGNOSIS — F319 Bipolar disorder, unspecified: Secondary | ICD-10-CM | POA: Diagnosis not present

## 2012-09-10 ENCOUNTER — Emergency Department (EMERGENCY_DEPARTMENT_HOSPITAL)
Admission: EM | Admit: 2012-09-10 | Discharge: 2012-09-12 | Disposition: A | Discharge status: Other Acute Inpatient Hospital | Payer: Medicare Other | Source: Home / Self Care

## 2012-09-10 ENCOUNTER — Encounter (HOSPITAL_COMMUNITY): Payer: Self-pay

## 2012-09-10 DIAGNOSIS — R45851 Suicidal ideations: Secondary | ICD-10-CM | POA: Diagnosis not present

## 2012-09-10 DIAGNOSIS — F4310 Post-traumatic stress disorder, unspecified: Secondary | ICD-10-CM | POA: Diagnosis not present

## 2012-09-10 DIAGNOSIS — J45909 Unspecified asthma, uncomplicated: Secondary | ICD-10-CM | POA: Diagnosis present

## 2012-09-10 DIAGNOSIS — Z79899 Other long term (current) drug therapy: Secondary | ICD-10-CM | POA: Diagnosis not present

## 2012-09-10 DIAGNOSIS — F3160 Bipolar disorder, current episode mixed, unspecified: Secondary | ICD-10-CM | POA: Diagnosis not present

## 2012-09-10 DIAGNOSIS — F3164 Bipolar disorder, current episode mixed, severe, with psychotic features: Secondary | ICD-10-CM | POA: Diagnosis not present

## 2012-09-10 DIAGNOSIS — F3130 Bipolar disorder, current episode depressed, mild or moderate severity, unspecified: Secondary | ICD-10-CM | POA: Diagnosis not present

## 2012-09-10 DIAGNOSIS — F319 Bipolar disorder, unspecified: Secondary | ICD-10-CM | POA: Diagnosis not present

## 2012-09-10 HISTORY — DX: Anxiety disorder, unspecified: F41.9

## 2012-09-10 LAB — COMPREHENSIVE METABOLIC PANEL
ALT: 13 U/L (ref 0–35)
Calcium: 9.7 mg/dL (ref 8.4–10.5)
Creatinine, Ser: 0.72 mg/dL (ref 0.50–1.10)
GFR calc Af Amer: >90 mL/min (ref >90)
Glucose, Bld: 93 mg/dL (ref 70–99)
Sodium: 134 mEq/L — ABNORMAL LOW (ref 135–145)
Total Protein: 7.3 g/dL (ref 6.0–8.3)

## 2012-09-10 LAB — CBC
Hemoglobin: 16.1 g/dL — ABNORMAL HIGH (ref 12.0–15.0)
MCH: 32.9 pg (ref 26.0–34.0)
MCHC: 36.2 g/dL — ABNORMAL HIGH (ref 30.0–36.0)
MCV: 90.8 fL (ref 78.0–100.0)

## 2012-09-10 LAB — RAPID URINE DRUG SCREEN, HOSP PERFORMED
Cocaine: NOT DETECTED
Opiates: NOT DETECTED
Tetrahydrocannabinol: NOT DETECTED

## 2012-09-10 LAB — SALICYLATE LEVEL: Salicylate Lvl: <2 mg/dL — ABNORMAL LOW (ref 2.8–20.0)

## 2012-09-10 LAB — ETHANOL: Alcohol, Ethyl (B): <11 mg/dL (ref 0–11)

## 2012-09-10 MED ORDER — ALBUTEROL SULFATE HFA 108 (90 BASE) MCG/ACT IN AERS: 2.0000 | INHALATION_SPRAY | Freq: Four times a day (QID) | RESPIRATORY_TRACT | Status: DC | PRN

## 2012-09-10 MED ORDER — MAGNESIUM 300 MG PO CAPS: 1.0000 | ORAL_CAPSULE | Freq: Every day | ORAL | Status: DC

## 2012-09-10 MED ORDER — ZINC GLUCONATE 50 MG PO TABS: 50.0000 mg | ORAL_TABLET | Freq: Every day | ORAL | Status: DC

## 2012-09-10 MED ORDER — NICOTINE 21 MG/24HR TD PT24
21.0000 mg | MEDICATED_PATCH | Freq: Once | TRANSDERMAL | Status: DC
  Administered 2012-09-10 – 2012-09-11 (×2): 21 mg via TRANSDERMAL
  Filled 2012-09-10 (×2): qty 1

## 2012-09-10 MED ORDER — VITAMIN K 100 MCG PO TABS: 100.0000 ug | ORAL_TABLET | Freq: Every day | ORAL | Status: DC

## 2012-09-10 MED ORDER — SELENIUM 50 MCG PO TABS
100.0000 ug | ORAL_TABLET | Freq: Every day | ORAL | Status: DC
  Filled 2012-09-10: qty 2

## 2012-09-10 MED ORDER — VITAMIN E 45 MG (100 UNIT) PO CAPS
100.0000 [IU] | ORAL_CAPSULE | Freq: Every day | ORAL | Status: DC
  Administered 2012-09-11 – 2012-09-12 (×2): 100 [IU] via ORAL
  Filled 2012-09-10 (×2): qty 1

## 2012-09-10 MED ORDER — CELECOXIB 200 MG PO CAPS
200.0000 mg | ORAL_CAPSULE | Freq: Every day | ORAL | Status: DC
  Administered 2012-09-11: 200 mg via ORAL
  Filled 2012-09-10 (×3): qty 1

## 2012-09-10 MED ORDER — CLONAZEPAM 1 MG PO TABS
2.0000 mg | ORAL_TABLET | Freq: Every day | ORAL | Status: DC
  Administered 2012-09-11 (×2): 2 mg via ORAL
  Filled 2012-09-10 (×3): qty 2

## 2012-09-10 MED ORDER — IBUPROFEN 800 MG PO TABS
800.0000 mg | ORAL_TABLET | Freq: Once | ORAL | Status: AC
  Administered 2012-09-10: 800 mg via ORAL
  Filled 2012-09-10: qty 1

## 2012-09-10 MED ORDER — CHOLECALCIFEROL 10 MCG (400 UNIT) PO TABS
400.0000 [IU] | ORAL_TABLET | Freq: Every day | ORAL | Status: DC
  Administered 2012-09-12: 400 [IU] via ORAL
  Filled 2012-09-10 (×2): qty 1

## 2012-09-10 MED ORDER — MAGNESIUM OXIDE 400 (241.3 MG) MG PO TABS
400.0000 mg | ORAL_TABLET | Freq: Every day | ORAL | Status: DC
  Administered 2012-09-11 – 2012-09-12 (×2): 400 mg via ORAL
  Filled 2012-09-10 (×2): qty 1

## 2012-09-10 NOTE — ED Notes (Signed)
Pt transferred from triage, presents SI for weeks, attempts in the past.  Plan to hang herself.  Denies HI or AV hallucinations, hx of PTSD & Bipolar.  Admits to life stressors, feeling hopeless.  Pt manic at present. Pt cooperative but anxious.

## 2012-09-10 NOTE — ED Provider Notes (Signed)
History    This chart was scribed for a non-physician practitioner, Roxy Horseman, PA-C, working with Nelia Shi, MD by Frederik Pear, ED Scribe. This patient was seen in room [facility]3/[facility] and the patient's care was started at 1832.   CSN: [id number]  Arrival date & time 09/10/12  1738   First MD Initiated Contact with Patient 09/10/12 1832      Chief Complaint  Patient presents with  . Medical Clearance  . Suicidal    (Consider location/radiation/quality/duration/timing/severity/associated sxs/prior treatment) The history is provided by the patient and medical records. No language interpreter was used.    Catherine Olsen is a 42 y.o. female with a h/o of SI who presents to the Emergency Department with a chief complaint of medical clearance for worsening mania and depression. She states that she has had increasingly worsened depression, mania and SI over the past few weeks and is wanting help. She states that has tried several medications to control her symptoms, but that all of there previous medications that she has tried have caused severe side effects. She reports a h/o of SI attempts, but denies a specific plan at this time. She denies HI or hallucinations. She has a h/o of searching the internet for the best ways to kill yourself and states that she has also contemplated overdosing on pills. She states that the reason that she has not gone through with previous attempts because she does not want to cause pain to her brother or her father, but reports that as time goes on she is feeling less and less obligated to causing them sadness. She reports that she is currently only taking 2 two mg tablets of klonopin at night. She states that she would preferred to be admitted at [facility] because it is closer to her father.   Past Medical History  Diagnosis Date  . Asthma   . Fibromyalgia     Past Surgical History  Procedure Laterality Date  . Other surgical history      Family History   Problem Relation Age of Onset  . Diabetes Mother   . Hypertension Mother     History  Substance Use Topics  . Smoking status: Current Every Day Smoker -- 1.00 packs/day for 26 years    Types: Cigarettes  . Smokeless tobacco: Not on file  . Alcohol Use: Yes     Comment: occasionally    OB History   Grav Para Term Preterm Abortions TAB SAB Ect Mult Living   2    2 2     0      Review of Systems A complete 10 system review of systems was obtained and all systems are negative except as noted in the HPI and PMH.  Allergies  Other  Home Medications   Current Outpatient Rx  Name  Route  Sig  Dispense  Refill  . albuterol (PROVENTIL HFA;VENTOLIN HFA) 108 (90 BASE) MCG/ACT inhaler   Inhalation   Inhale 2 puffs into the lungs every 6 (six) hours as needed. Shortness of breath         . aspirin 325 MG tablet   Oral   Take 325 mg by mouth every 4 (four) hours as needed. Pain         . calcium-vitamin D (OSCAL WITH D) 500-200 MG-UNIT per tablet   Oral   Take 1 tablet by mouth every morning.         . clonazePAM (KLONOPIN) 2 MG tablet   Oral  Take 2 mg by mouth at bedtime.          Marland Kitchen EPINEPHrine (EPIPEN) 0.3 mg/0.3 mL DEVI   Intramuscular   Inject 0.3 mg into the muscle once.         . Multiple Vitamin (MULTIVITAMIN WITH MINERALS) TABS   Oral   Take 1 tablet by mouth every evening.            BP 100/72  Pulse 94  Temp(Src) 98.6 F (37 C) (Oral)  Resp 16  SpO2 98%  Physical Exam  Nursing note and vitals reviewed. Constitutional: She is oriented to person, place, and time. She appears well-developed and well-nourished. No distress.  HENT:  Head: Normocephalic and atraumatic.  Eyes: EOM are normal. Pupils are equal, round, and reactive to light.  Neck: Normal range of motion. Neck supple. No tracheal deviation present.  Cardiovascular: Normal rate, regular rhythm, normal heart sounds and intact distal pulses.  Exam reveals no gallop and no friction  rub.   No murmur heard. Pulmonary/Chest: Effort normal. No respiratory distress. She has no wheezes. She has no rales. She exhibits no tenderness.  Abdominal: Soft. Bowel sounds are normal. She exhibits no distension and no mass. There is no tenderness. There is no rebound and no guarding.  Musculoskeletal: Normal range of motion. She exhibits no edema.  Neurological: She is alert and oriented to person, place, and time.  Skin: Skin is warm and dry.  Psychiatric: She has a normal mood and affect. Her behavior is normal.    ED Course  Procedures (including critical care time)  DIAGNOSTIC STUDIES: Oxygen Saturation is 98% on room air, normal by my interpretation.    COORDINATION OF CARE:  18:52- Discussed planned course of treatment with the patient, including an ACT consult, who is agreeable at this time.  Labs Reviewed  ACETAMINOPHEN LEVEL  CBC  COMPREHENSIVE METABOLIC PANEL  ETHANOL  SALICYLATE LEVEL  URINE RAPID DRUG SCREEN (HOSP PERFORMED)   Results for orders placed during the hospital encounter of 09/10/12  ACETAMINOPHEN LEVEL      Result Value Range   Acetaminophen (Tylenol), Serum <15.0  10 - 30 ug/mL  CBC      Result Value Range   WBC 8.3  4.0 - 10.5 K/uL   RBC 4.90  3.87 - 5.11 MIL/uL   Hemoglobin 16.1 (*) 12.0 - 15.0 g/dL   HCT 09.8  11.9 - 14.7 %   MCV 90.8  78.0 - 100.0 fL   MCH 32.9  26.0 - 34.0 pg   MCHC 36.2 (*) 30.0 - 36.0 g/dL   RDW 82.9  56.2 - 13.0 %   Platelets 209  150 - 400 K/uL  COMPREHENSIVE METABOLIC PANEL      Result Value Range   Sodium 134 (*) 135 - 145 mEq/L   Potassium 4.0  3.5 - 5.1 mEq/L   Chloride 99  96 - 112 mEq/L   CO2 24  19 - 32 mEq/L   Glucose, Bld 93  70 - 99 mg/dL   BUN 17  6 - 23 mg/dL   Creatinine, Ser 8.65  0.50 - 1.10 mg/dL   Calcium 9.7  8.4 - 78.4 mg/dL   Total Protein 7.3  6.0 - 8.3 g/dL   Albumin 3.9  3.5 - 5.2 g/dL   AST 14  0 - 37 U/L   ALT 13  0 - 35 U/L   Alkaline Phosphatase 73  39 - 117 U/L   Total  Bilirubin 0.2 (*)  0.3 - 1.2 mg/dL   GFR calc non Af Amer >90  >90 mL/min   GFR calc Af Amer >90  >90 mL/min  ETHANOL      Result Value Range   Alcohol, Ethyl (B) <11  0 - 11 mg/dL  SALICYLATE LEVEL      Result Value Range   Salicylate Lvl <2.0 (*) 2.8 - 20.0 mg/dL  URINE RAPID DRUG SCREEN (HOSP PERFORMED)      Result Value Range   Opiates NONE DETECTED  NONE DETECTED   Cocaine NONE DETECTED  NONE DETECTED   Benzodiazepines NONE DETECTED  NONE DETECTED   Amphetamines NONE DETECTED  NONE DETECTED   Tetrahydrocannabinol NONE DETECTED  NONE DETECTED   Barbiturates NONE DETECTED  NONE DETECTED   US Breast Bilateral  08/25/2012   *RADIOLOGY REPORT*  Clinical Data:  Tenderness and thickening felt by the patient in the upper right breast at the site of a previous biopsy.  The patient also feels areas of thickening in the upper and outer left breast.  DIGITAL DIAGNOSTIC BILATERAL MAMMOGRAM WITH CAD AND BILATERAL BREAST ULTRASOUND:  Comparison:  Previous examinations.  Findings:  ACR Breast Density Category 4: The breast tissue is extremely dense.  No significant change in scattered right breast calcifications. Interval rounded, partially obscured, smoothly marginated density in the outer left breast.  Mammographic images were processed with CAD.  On physical exam, there is palpable soft tissue thickening throughout the upper portions of both breasts and lateral left breast in the areas of patient concern.  No discrete masses are palpable.  Ultrasound is performed, showing multiple cysts throughout the upper portions of both breasts and in the lateral portion of the left breast.  The cysts on the right all measure 8 mm or less in maximum diameter.  The largest cyst on the left is in the 3 o'clock position, 7 cm from the nipple, measuring 1.5 cm in maximum diameter, corresponding to the rounded mass seen mammographically.  IMPRESSION: Multiple bilateral breast cysts.  No evidence of malignancy.   RECOMMENDATION: Bilateral screening mammogram in 1 year.  I have discussed the findings and recommendations with the patient. Results were also provided in writing at the conclusion of the visit.  If applicable, a reminder letter will be sent to the patient regarding the next appointment.  BI-RADS CATEGORY 2:  Benign finding(s).   Original Report Authenticated By: Beckie Salts, M.D.   Mm Digital Diagnostic Bilat  08/25/2012   *RADIOLOGY REPORT*  Clinical Data:  Tenderness and thickening felt by the patient in the upper right breast at the site of a previous biopsy.  The patient also feels areas of thickening in the upper and outer left breast.  DIGITAL DIAGNOSTIC BILATERAL MAMMOGRAM WITH CAD AND BILATERAL BREAST ULTRASOUND:  Comparison:  Previous examinations.  Findings:  ACR Breast Density Category 4: The breast tissue is extremely dense.  No significant change in scattered right breast calcifications. Interval rounded, partially obscured, smoothly marginated density in the outer left breast.  Mammographic images were processed with CAD.  On physical exam, there is palpable soft tissue thickening throughout the upper portions of both breasts and lateral left breast in the areas of patient concern.  No discrete masses are palpable.  Ultrasound is performed, showing multiple cysts throughout the upper portions of both breasts and in the lateral portion of the left breast.  The cysts on the right all measure 8 mm or less in maximum diameter.  The largest cyst on the left  is in the 3 o'clock position, 7 cm from the nipple, measuring 1.5 cm in maximum diameter, corresponding to the rounded mass seen mammographically.  IMPRESSION: Multiple bilateral breast cysts.  No evidence of malignancy.  RECOMMENDATION: Bilateral screening mammogram in 1 year.  I have discussed the findings and recommendations with the patient. Results were also provided in writing at the conclusion of the visit.  If applicable, a reminder letter  will be sent to the patient regarding the next appointment.  BI-RADS CATEGORY 2:  Benign finding(s).   Original Report Authenticated By: Beckie Salts, M.D.      1. Bipolar 1 disorder       MDM  I personally performed the services described in this documentation, which was scribed in my presence. The recorded information has been reviewed and is accurate.          Roxy Horseman, PA-C 09/21/12 1559

## 2012-09-10 NOTE — ED Notes (Signed)
Patient reports that she is feeling suicidal and has been feeling so x a few weeks. Patient states that she googles on how to best kill yourself. Patient states that she has attempted suicide in the past.

## 2012-09-11 ENCOUNTER — Encounter (HOSPITAL_COMMUNITY): Payer: Self-pay | Admitting: *Deleted

## 2012-09-11 DIAGNOSIS — F3130 Bipolar disorder, current episode depressed, mild or moderate severity, unspecified: Secondary | ICD-10-CM

## 2012-09-11 MED ORDER — ALUM & MAG HYDROXIDE-SIMETH 200-200-20 MG/5ML PO SUSP
30.0000 mL | Freq: Four times a day (QID) | ORAL | Status: DC | PRN
  Administered 2012-09-11: 30 mL via ORAL
  Filled 2012-09-11: qty 30

## 2012-09-11 MED ORDER — NICOTINE 21 MG/24HR TD PT24: 21.0000 mg | MEDICATED_PATCH | Freq: Once | TRANSDERMAL | Status: DC
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
## 2012-09-11 NOTE — ED Provider Notes (Signed)
Patient sleeping, no issues overnight. BP 102/65  Pulse 69  Temp(Src) 97.4 F (36.3 C) (Oral)  Resp 18  SpO2 96%   Glynn Octave, MD 09/11/12 [phone]

## 2012-09-11 NOTE — ED Provider Notes (Signed)
Telepsych consulted completed by Dr Trisha Mangle.  Inpatient treatment recommended.  Catherine Kras, MD 09/11/12 [phone]

## 2012-09-11 NOTE — Progress Notes (Signed)
[facility] MD Progress Note  09/11/2012 3:23 AM Catherine Olsen  MRN:  [id number] Subjective: C/o increasing suicidality in context of MDD and PTSD only treated with clonopin presently.Thinks she needs abilify.Hasnt had success with lithium/seroquels and other MDD meds Diagnosis:  Axis I: Bipolar, Depressed  ADL's:  Intact  Sleep: Fair  Appetite:  Fair  Suicidal Ideation:  Intent:  Increasing intrusive thoughts-prior attempts Homicidal Ideation:  Intent:  none AEB (as evidenced by):  Psychiatric Specialty Exam: Review of Systems  Constitutional: Negative.   HENT: Negative.   Eyes: Negative.   Respiratory: Negative.   Cardiovascular: Negative.   Gastrointestinal: Negative.   Genitourinary: Negative.   Musculoskeletal: Negative.   Skin: Negative.   Neurological: Negative.   Endo/Heme/Allergies: Negative.   Psychiatric/Behavioral: Positive for depression and suicidal ideas. Negative for hallucinations, memory loss and substance abuse. The patient is nervous/anxious. The patient does not have insomnia.     Blood pressure 107/74, pulse 88, temperature 98.4 F (36.9 C), temperature source Oral, resp. rate 17, SpO2 98.00%.There is no weight on file to calculate BMI.  General Appearance: Disheveled  Eye Contact::  Good  Speech:  Normal Rate  Volume:  Normal  Mood:  Dysphoric  Affect:  Flat  Thought Process:  Goal Directed  Orientation:  Full (Time, Place, and Person)  Thought Content:  suicidal  Suicidal Thoughts:  Yes.  without intent/plan  Homicidal Thoughts:  No  Memory:  Immediate;   Good  Judgement:  Good  Insight:  Fair  Psychomotor Activity:  Normal  Concentration:  Fair  Recall:  Good  Akathisia:  No  Handed:  Right  AIMS (if indicated):     Assets:  Desire for Improvement  Sleep:      Current Medications: Current Facility-Administered Medications  Medication Dose Route Frequency Provider Last Rate Last Dose  . albuterol (PROVENTIL HFA;VENTOLIN HFA) 108 (90 BASE)  MCG/ACT inhaler 2 puff  2 puff Inhalation Q6H PRN Roxy Horseman, PA-C      . celecoxib (CELEBREX) capsule 200 mg  200 mg Oral QHS Roxy Horseman, PA-C      . cholecalciferol (VITAMIN D) tablet 400 Units  400 Units Oral Daily Roxy Horseman, PA-C      . clonazePAM Scarlette Calico) tablet 2 mg  2 mg Oral QHS Roxy Horseman, PA-C   2 mg at 09/11/12 0006  . magnesium oxide (MAG-OX) tablet 400 mg  400 mg Oral Daily Roxy Horseman, PA-C      . nicotine (NICODERM CQ - dosed in mg/24 hours) patch 21 mg  21 mg Transdermal Once Roxy Horseman, PA-C   21 mg at 09/10/12 2054  . vitamin E capsule 100 Units  100 Units Oral Daily Roxy Horseman, PA-C       Current Outpatient Prescriptions  Medication Sig Dispense Refill  . albuterol (PROVENTIL HFA;VENTOLIN HFA) 108 (90 BASE) MCG/ACT inhaler Inhale 2 puffs into the lungs every 6 (six) hours as needed. Shortness of breath      . celecoxib (CELEBREX) 200 MG capsule Take 200 mg by mouth at bedtime.      . Cholecalciferol (VITAMIN D PO) Take 1 capsule by mouth daily.      . clonazePAM (KLONOPIN) 2 MG tablet Take 2 mg by mouth at bedtime.       . COPPER PO Take 1 tablet by mouth daily.      . Magnesium 300 MG CAPS Take 1 capsule by mouth daily.      Marland Kitchen selenium 50 MCG TABS Take 100  mcg by mouth daily.      Marland Kitchen VITAMIN E PO Take 1 capsule by mouth daily.      . vitamin k 100 MCG tablet Take 100 mcg by mouth daily.      Marland Kitchen zinc gluconate 50 MG tablet Take 50 mg by mouth daily.      Marland Kitchen EPINEPHrine (EPIPEN) 0.3 mg/0.3 mL DEVI Inject 0.3 mg into the muscle once.        Lab Results:  Results for orders placed during the hospital encounter of 09/10/12 (from the past 48 hour(s))  ACETAMINOPHEN LEVEL     Status: None   Collection Time    09/10/12  6:36 PM      Result Value Range   Acetaminophen (Tylenol), Serum <15.0  10 - 30 ug/mL   Comment:            THERAPEUTIC CONCENTRATIONS VARY     SIGNIFICANTLY. A RANGE OF 10-30     ug/mL MAY BE AN EFFECTIVE      CONCENTRATION FOR MANY PATIENTS.     HOWEVER, SOME ARE BEST TREATED     AT CONCENTRATIONS OUTSIDE THIS     RANGE.     ACETAMINOPHEN CONCENTRATIONS     >150 ug/mL AT 4 HOURS AFTER     INGESTION AND >50 ug/mL AT 12     HOURS AFTER INGESTION ARE     OFTEN ASSOCIATED WITH TOXIC     REACTIONS.  CBC     Status: Abnormal   Collection Time    09/10/12  6:36 PM      Result Value Range   WBC 8.3  4.0 - 10.5 K/uL   RBC 4.90  3.87 - 5.11 MIL/uL   Hemoglobin 16.1 (*) 12.0 - 15.0 g/dL   HCT 16.1  09.6 - 04.5 %   MCV 90.8  78.0 - 100.0 fL   MCH 32.9  26.0 - 34.0 pg   MCHC 36.2 (*) 30.0 - 36.0 g/dL   RDW 40.9  81.1 - 91.4 %   Platelets 209  150 - 400 K/uL  COMPREHENSIVE METABOLIC PANEL     Status: Abnormal   Collection Time    09/10/12  6:36 PM      Result Value Range   Sodium 134 (*) 135 - 145 mEq/L   Potassium 4.0  3.5 - 5.1 mEq/L   Chloride 99  96 - 112 mEq/L   CO2 24  19 - 32 mEq/L   Glucose, Bld 93  70 - 99 mg/dL   BUN 17  6 - 23 mg/dL   Creatinine, Ser 7.82  0.50 - 1.10 mg/dL   Calcium 9.7  8.4 - 95.6 mg/dL   Total Protein 7.3  6.0 - 8.3 g/dL   Albumin 3.9  3.5 - 5.2 g/dL   AST 14  0 - 37 U/L   ALT 13  0 - 35 U/L   Alkaline Phosphatase 73  39 - 117 U/L   Total Bilirubin 0.2 (*) 0.3 - 1.2 mg/dL   GFR calc non Af Amer >90  >90 mL/min   GFR calc Af Amer >90  >90 mL/min   Comment:            The eGFR has been calculated     using the CKD EPI equation.     This calculation has not been     validated in all clinical     situations.     eGFR's persistently     <90 mL/min  signify     possible Chronic Kidney Disease.  ETHANOL     Status: None   Collection Time    09/10/12  6:36 PM      Result Value Range   Alcohol, Ethyl (B) <11  0 - 11 mg/dL   Comment:            LOWEST DETECTABLE LIMIT FOR     SERUM ALCOHOL IS 11 mg/dL     FOR MEDICAL PURPOSES ONLY  SALICYLATE LEVEL     Status: Abnormal   Collection Time    09/10/12  6:36 PM      Result Value Range   Salicylate Lvl  <2.0 (*) 2.8 - 20.0 mg/dL  URINE RAPID DRUG SCREEN (HOSP PERFORMED)     Status: None   Collection Time    09/10/12  6:44 PM      Result Value Range   Opiates NONE DETECTED  NONE DETECTED   Cocaine NONE DETECTED  NONE DETECTED   Benzodiazepines NONE DETECTED  NONE DETECTED   Amphetamines NONE DETECTED  NONE DETECTED   Tetrahydrocannabinol NONE DETECTED  NONE DETECTED   Barbiturates NONE DETECTED  NONE DETECTED   Comment:            DRUG SCREEN FOR MEDICAL PURPOSES     ONLY.  IF CONFIRMATION IS NEEDED     FOR ANY PURPOSE, NOTIFY LAB     WITHIN 5 DAYS.                LOWEST DETECTABLE LIMITS     FOR URINE DRUG SCREEN     Drug Class       Cutoff (ng/mL)     Amphetamine      1000     Barbiturate      200     Benzodiazepine   200     Tricyclics       300     Opiates          300     Cocaine          300     THC              50    Physical Findings: AIMS:  , ,  ,  ,    CIWA:    COWS:     Treatment Plan Summary: Daily contact with patient to assess and evaluate symptoms and progress in treatment  Plan:Admit to Dr Dub Mikes pt request  Medical Decision Making Problem Points:  Established problem, worsening (2) Data Points:  Review of medication regiment & side effects (2)  I certify that inpatient services furnished can reasonably be expected to improve the patient's condition.   Maryjean Morn E 09/11/2012, 3:23 AM

## 2012-09-11 NOTE — Progress Notes (Addendum)
Chaplain paged at 2242 10 September 2012, arriving at 2306.  [name] requested spiritual care in calling a [employer] to speak to. Chaplain came to get parish information, and assess if the request was an emergency or if a priest could visit the next day. [name] is a congregant at a local parish and the priests were left a message to come on Saturday 7 June.  [name] requested a plastic rosary because it would make her feel more safe. This request was brought to the nursing staff. Because of safety requirements this request was turned down.  Conversation with [name] concerned her spiritual life and spiritual pain. She requested prayer for those things giving her spiritual pain and the prayer was given. After her time here she plans a spiritual retreat to find peace.  Catherine Olsen. Catherine Olsen, DMin, MDiv Chaplain

## 2012-09-11 NOTE — Progress Notes (Signed)
PHARMACIST - PHYSICIAN ORDER COMMUNICATION  CONCERNING: P&T Medication Policy on Herbal Medications  DESCRIPTION:  This patient's order for:  selenium  has been noted.  This product(s) is classified as an "herbal" or natural product. Due to a lack of definitive safety studies or FDA approval, nonstandard manufacturing practices, plus the potential risk of unknown drug-drug interactions while on inpatient medications, the Pharmacy and Therapeutics Committee does not permit the use of "herbal" or natural products of this type within [facility].   ACTION TAKEN: The pharmacy department is unable to verify this order at this time and your patient has been informed of this safety policy. Please reevaluate patient's clinical condition at discharge and address if the herbal or natural product(s) should be resumed at that time.  Lorenza Evangelist 09/11/2012 3:01 AM

## 2012-09-11 NOTE — BH Assessment (Signed)
[facility] Assessment Progress Note   [facility] has psych and detox beds and info was sent there at 1030.  Provided cell number and [facility] assessment number as call back for [facility] to follow up    

## 2012-09-11 NOTE — Progress Notes (Signed)
For unknown reasons the [employer] that was called to visit Catherine Olsen has not come. Catherine Pullen is understanding given this situation, but still wishes to speak to a priest. Call made again to her parish on her behalf.   Issues of faith was the primary topic of discussion. She was engaged in the conversation and where it lead her spiritually. She still wishes to go on a spiritual retreat after she is discharged. There are retreat centers for all types of retreat. Recommend when she is about to be discharged the chaplain be paged to discuss retreat centers, both religious and secular.  Prayer given and gratefully received.  Benjie Karvonen. Ilisha Blust, DMin, MDiv Chaplain

## 2012-09-11 NOTE — ED Notes (Signed)
Aaox3.  Pt requestiong to speak with ACT Team.  Pt reports "i am not suicidal, I actually feel good.  I think I will just stay here tonight and take my abilify in the morning and then go home since I will fllow up with my appointment with psychiatrist on Wednesday.  Can I speak with theACT Team again to discuss my plans."  Pt constantly in and out of room with same questions. Pt states "I am manic and bipolar and I just need the right meds."  Pt denies SI/HI/VH/AH at this time.

## 2012-09-11 NOTE — BHH Counselor (Addendum)
TC to [place] at [place]. She says that she hasn't looked at pt's referral which was faxed in this am. She is working at a different job tonight and it may be a while before she can review referral. Clinical research associate then refaxed referral to make sure Berton Lan has it.  Annice Pih at [facility] - no beds Rosey Bath at [facility] - no beds Asher Muir at [facility] - no beds Barbara Cower at [facility] - no beds [place] at [facility] - no beds Velna Hatchet at [facility] - no beds   Evette Cristal, [place] Assessment Counselor

## 2012-09-11 NOTE — ED Notes (Signed)
Pt requesting more meds to sleep.  Pt given scheduled 2mg  Klonopin PO.  Pt request Benadryl and/or Atarax. Dr Lynelle Doctor made aware.  No new orders given at this time.  Will continue to monitor.

## 2012-09-12 ENCOUNTER — Encounter (HOSPITAL_COMMUNITY): Payer: Self-pay

## 2012-09-12 ENCOUNTER — Inpatient Hospital Stay (HOSPITAL_COMMUNITY)
Admission: AD | Admit: 2012-09-12 | Discharge: 2012-09-15 | DRG: 885 | Disposition: A | Discharge status: Home / Self Care | Payer: Medicare Other | Source: Intra-hospital | Attending: Psychiatry | Admitting: Psychiatry

## 2012-09-12 DIAGNOSIS — F4310 Post-traumatic stress disorder, unspecified: Secondary | ICD-10-CM | POA: Diagnosis present

## 2012-09-12 DIAGNOSIS — F3160 Bipolar disorder, current episode mixed, unspecified: Principal | ICD-10-CM

## 2012-09-12 DIAGNOSIS — F3110 Bipolar disorder, current episode manic without psychotic features, unspecified: Secondary | ICD-10-CM

## 2012-09-12 DIAGNOSIS — Z79899 Other long term (current) drug therapy: Secondary | ICD-10-CM

## 2012-09-12 DIAGNOSIS — R45851 Suicidal ideations: Secondary | ICD-10-CM

## 2012-09-12 DIAGNOSIS — F319 Bipolar disorder, unspecified: Secondary | ICD-10-CM

## 2012-09-12 DIAGNOSIS — J45909 Unspecified asthma, uncomplicated: Secondary | ICD-10-CM | POA: Diagnosis present

## 2012-09-12 MED ORDER — ACETAMINOPHEN 325 MG PO TABS
650.0000 mg | ORAL_TABLET | Freq: Four times a day (QID) | ORAL | Status: DC | PRN
  Administered 2012-09-14 – 2012-09-15 (×2): 650 mg via ORAL

## 2012-09-12 MED ORDER — ALUM & MAG HYDROXIDE-SIMETH 200-200-20 MG/5ML PO SUSP
30.0000 mL | ORAL | Status: DC | PRN
  Administered 2012-09-12 – 2012-09-14 (×4): 30 mL via ORAL

## 2012-09-12 MED ORDER — NICOTINE 21 MG/24HR TD PT24
21.0000 mg | MEDICATED_PATCH | Freq: Once | TRANSDERMAL | Status: AC
  Administered 2012-09-12: 21 mg via TRANSDERMAL
  Filled 2012-09-12: qty 1

## 2012-09-12 MED ORDER — VITAMIN E 45 MG (100 UNIT) PO CAPS
100.0000 [IU] | ORAL_CAPSULE | Freq: Every day | ORAL | Status: DC
  Administered 2012-09-13 – 2012-09-15 (×3): 100 [IU] via ORAL
  Filled 2012-09-12 (×5): qty 1

## 2012-09-12 MED ORDER — HYDROXYZINE HCL 25 MG PO TABS
25.0000 mg | ORAL_TABLET | Freq: Once | ORAL | Status: DC
  Filled 2012-09-12: qty 1

## 2012-09-12 MED ORDER — IBUPROFEN 800 MG PO TABS
800.0000 mg | ORAL_TABLET | Freq: Four times a day (QID) | ORAL | Status: DC | PRN
  Administered 2012-09-12: 800 mg via ORAL
  Filled 2012-09-12: qty 1

## 2012-09-12 MED ORDER — TRAZODONE HCL 50 MG PO TABS
50.0000 mg | ORAL_TABLET | Freq: Every evening | ORAL | Status: DC | PRN
  Filled 2012-09-12: qty 6
  Filled 2012-09-12: qty 1

## 2012-09-12 MED ORDER — MAGNESIUM HYDROXIDE 400 MG/5ML PO SUSP: 30.0000 mL | Freq: Every day | ORAL | Status: DC | PRN

## 2012-09-12 MED ORDER — ARIPIPRAZOLE 5 MG PO TABS
5.0000 mg | ORAL_TABLET | Freq: Every day | ORAL | Status: DC
  Administered 2012-09-12: 5 mg via ORAL
  Filled 2012-09-12: qty 1

## 2012-09-12 MED ORDER — CELECOXIB 200 MG PO CAPS
200.0000 mg | ORAL_CAPSULE | Freq: Every day | ORAL | Status: DC
  Administered 2012-09-12 – 2012-09-14 (×3): 200 mg via ORAL
  Filled 2012-09-12 (×2): qty 1
  Filled 2012-09-12: qty 2
  Filled 2012-09-12 (×2): qty 1

## 2012-09-12 MED ORDER — CLONAZEPAM 1 MG PO TABS
2.0000 mg | ORAL_TABLET | Freq: Every day | ORAL | Status: DC
  Administered 2012-09-12 – 2012-09-14 (×3): 2 mg via ORAL
  Filled 2012-09-12 (×3): qty 2

## 2012-09-12 MED ORDER — ALBUTEROL SULFATE HFA 108 (90 BASE) MCG/ACT IN AERS: 2.0000 | INHALATION_SPRAY | Freq: Four times a day (QID) | RESPIRATORY_TRACT | Status: DC | PRN

## 2012-09-12 MED ORDER — MAGNESIUM OXIDE 400 (241.3 MG) MG PO TABS
400.0000 mg | ORAL_TABLET | Freq: Every day | ORAL | Status: DC
  Administered 2012-09-13 – 2012-09-15 (×3): 400 mg via ORAL
  Filled 2012-09-12 (×5): qty 1

## 2012-09-12 MED ORDER — CHOLECALCIFEROL 10 MCG (400 UNIT) PO TABS
400.0000 [IU] | ORAL_TABLET | Freq: Every day | ORAL | Status: DC
  Administered 2012-09-13 – 2012-09-15 (×3): 400 [IU] via ORAL
  Filled 2012-09-12 (×5): qty 1

## 2012-09-12 NOTE — ED Notes (Signed)
Chaplain paged per pt request

## 2012-09-12 NOTE — BH Assessment (Signed)
[facility] Assessment Progress Note  Pt reviewed with Assunta Found, FNP.  She agrees to accept pt to [facility] to the service of Thedore Mins, MD, Rm [id number].  Per Jacquelyne Balint, RN, Administrative Coordinator, pt is to be transferred as close to 16:30 as is practical.  At 12:40 I called Scherrie Merritts, LCSW, Assessment Counselor, to notify him.  Doylene Canning, MA Assessment Counselor 09/12/2012 @ 13:07

## 2012-09-12 NOTE — BHH Counselor (Addendum)
TC to [place] at [place] to see if pt's referral had been reviewed. Vassie Loll says they have no female beds currently and no one to review referral during day shift.  Christy at [facility] has beds. Referral faxed to [facility]  At 0225 6/8.  Evette Cristal, [place] Assessment Counselor

## 2012-09-12 NOTE — Progress Notes (Signed)
Pt. Is a 42 yr. Old female admitted IVC. Due to increased mania and depression.  Pt. Now denies SI and states that she had to say that to be admitted to [facility] and get her medications adjusted.  Pt. Is fixated on abilify and getting a prescription for it.  She did receive it while in the ED and states she feels it helped her.  Pt. Is also fixated on having Dr. Dub Mikes because she has had him before and knows him.  Pt. States some paranoia and has to watch her belongings being secured.  Pt.does report family support from her Father and Brother.  Pt. Denies SI at this time.  Pt. Was escorted to the 400 hall in time for dinner with her peers.

## 2012-09-12 NOTE — BH Assessment (Signed)
[facility] Assessment Progress Note     In take paper work complete and awaiting pt transfer / discharge to [facility] via [employer] due to IVC

## 2012-09-12 NOTE — ED Notes (Signed)
[employer] notified of transportation needed to [facility]. 

## 2012-09-12 NOTE — Tx Team (Signed)
Initial Interdisciplinary Treatment Plan  PATIENT STRENGTHS: (choose at least two) Ability for insight Average or above average intelligence  PATIENT STRESSORS: Medication change or noncompliance   PROBLEM LIST: Problem List/Patient Goals Date to be addressed Date deferred Reason deferred Estimated date of resolution  Mania 09/12/12     Depression 09/12/12                                                DISCHARGE CRITERIA:  Ability to meet basic life and health needs Improved stabilization in mood, thinking, and/or behavior  PRELIMINARY DISCHARGE PLAN: Attend aftercare/continuing care group  PATIENT/FAMIILY INVOLVEMENT: This treatment plan has been presented to and reviewed with the patient, JANIRA MANDELL, and/or family member, .  The patient and family have been given the opportunity to ask questions and make suggestions.  Cailen Mihalik, Lane 09/12/2012, 7:03 PM

## 2012-09-12 NOTE — B[facility] Counselor (Signed)
Magistrate Lequita Halt confirmed receipt of IVC paperwork. Originals placed in IVC log in psych ed and copies placed in pt's chart.  Evette Cristal, [place] Assessment Counselor

## 2012-09-12 NOTE — ED Notes (Signed)
Report called to [facility]. Will call [employer] at 1600 to transport to [facility].

## 2012-09-12 NOTE — ED Notes (Signed)
Reinforced plan of care: facilities that have been faxed her info for admission, facilities that have no beds available, reason why IVC papers were initiated, psychiatrist will be here tomorrow, he is the one who can reverse IVC. Reassured we are here to help her. We are not against her. No one can tell her exactly when she will be transferred, nor do we know how long she will be there. Offered again "busy work" to help with her racing thoughts-she declined.

## 2012-09-12 NOTE — ED Notes (Signed)
Chaplain in nursing station talking with Catherine Olsen and DR Fredderick Phenix.  Dr Fredderick Phenix and Page ACT Team has made pt aware of IVC status and reasoning.  Pt is made aware by Dr Fredderick Phenix that there are no beds available at another facility at this time and that we are continuing to look for an available bed.

## 2012-09-12 NOTE — ED Provider Notes (Addendum)
Filed Vitals:   09/12/12 0530  BP: 114/76  Pulse: 75  Temp: 97.6 F (36.4 C)  Resp: 18   Patient is a bit agitated this morning. She feels that being an assay daily as making her worse. She wants to leave however her IVC papers were initiated yesterday given that she has expressed suicidal ideations and expressed that she was googling the best way to kill her self. At this point I did not feel comfortable reversing IVC papers. The chaplain is in to talk with her. She is still waiting for bed placement. I advised her that they're likely be more beds opening up tomorrow and likely she'll be placed by then.  Rolan Bucco, MD 09/12/12 [phone]  1305: pt has been accepted to [facility]  Rolan Bucco, MD 09/12/12 1306

## 2012-09-12 NOTE — ED Notes (Signed)
[employer] and security at bedside.  Pt demanding to be transferred to another facility or released. Pt very loud and belligerent.  Pt advised by staff to lower her voice.  Pt cursing at staff.  Pt demanding to speak with therapists "now".  Pt requesting to walk around the unit and interact with patients.  Pt advised that other patients are in their rooms trying to sleep.  Pt requested to talk with chaplain.  Staff paged chaplain for patient.  Chaplain will come in to see patient this morning.  Page, ACT Team assessment has communicated with patient throught the night about bed status.  Pt has been made aware by ACT that there are no beds available at this time.  Pt continues to insist that beds are available and that "you people are treating me bad and not giving me proper care."  DR otter notified and made aware of patient status.  Per md order, pt needs to be IVC'd.  New med orders given.  Pt refuses new meds ordered.

## 2012-09-12 NOTE — Progress Notes (Addendum)
Chaplain paged at [phone], arriving in unit at 0608. This was a long visit due to the circumstances of Catherine Olsen's care status from voluntary to involuntary.  This is the third conversation with the chaplain this weekend. Previously the conversations dealt with wishes for a [employer] to visit. Her parish priest was called, but she indicated no priest has arrived. A call to her parish was made and a voice mail at the [facility] was left. Other conversations dealt with her mental health history and how it impacted on her spiritual being.  The conversations this morning dealt with being told last night that he status may change from voluntary to involuntary because she asked to be released from care. She feels that the care she is being given is not what she expected. She expected to voluntarily admit herself, get the meds she needs, and be released. She expected to be a part of therapy groups, have an exercise yard, and not be confined to her room. She feels she is being punished for tearing pages out of magazines and balling them up and throwing them against the wall because that was a therapy suggested by her therapist.  Largely our conversation dealt with her admission she may have gamed the system by saying she is suicidal in order to get admitted. The chaplain and Catherine Olsen spoke at length about our believing someone when they tell us they are suicidal, and our care being based on this information. We discussed the catch of telling care givers that you lied, and that you gamed the system, because it requires caregivers to sort at what point one is gaming and at what point one is telling the truth.   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 She has an outside therapist and her IVA will, she feels, terminate that relationship.  Spiritually she is undone. She is upset that a priest has not come to see her, and that the saints she  prays through are not advocating on her behalf to God. She is depressed over the IVA and says she has lost all hope.  Recommend continued Spiritual Care and that Weekday Staff Chaplains assist in constructing the spiritual care part of a plan of care for Catherine. Olsen.  Suggest a Social Work consult to assist Catherine Olsen with problems related to not being able to pay her bills because she does not have access to her bank accounts or electronic bill paying programs.  Benjie Karvonen. Sherley Leser, DMin, MDiv Chaplain

## 2012-09-12 NOTE — ED Notes (Signed)
[employer] here to transport to [facility]. Belongings bag x2 verified with patient and given to [employer]. Patient valuables envelope retrieved from security office and contents verified with patient. Envelope resealed and placed in belongings bag. Belongings bag x2 given to police officer.

## 2012-09-12 NOTE — ED Notes (Signed)
The chaplain called back and said it will be about 30 minutes before he arrives.

## 2012-09-12 NOTE — BHH Counselor (Signed)
This Clinical research associate spoke with pt on several occasions during the night. Writer provided supportive therapy and used reflective listening as pt spoke in detail about stressors in her life. Writer let pt know that Clinical research associate would continue to work on bed finding throughout the night. Pt indicated frustration that a bed hadn't been found for her yet. Writer told patient that several hospitals throughout [place] had been contacted but didn't have beds. Pt continues to be pending review at [facility]. Oncoming ACT will followup with [facility] to see if they have accepted patient.   Evette Cristal, [place] Assessment Counselor

## 2012-09-13 ENCOUNTER — Encounter (HOSPITAL_COMMUNITY): Payer: Self-pay | Admitting: Psychiatry

## 2012-09-13 DIAGNOSIS — F3164 Bipolar disorder, current episode mixed, severe, with psychotic features: Secondary | ICD-10-CM

## 2012-09-13 DIAGNOSIS — F4310 Post-traumatic stress disorder, unspecified: Secondary | ICD-10-CM

## 2012-09-13 DIAGNOSIS — F319 Bipolar disorder, unspecified: Secondary | ICD-10-CM

## 2012-09-13 MED ORDER — NICOTINE 21 MG/24HR TD PT24
21.0000 mg | MEDICATED_PATCH | Freq: Once | TRANSDERMAL | Status: DC
  Administered 2012-09-13: 21 mg via TRANSDERMAL
  Filled 2012-09-13: qty 1

## 2012-09-13 MED ORDER — ARIPIPRAZOLE 5 MG PO TABS
5.0000 mg | ORAL_TABLET | Freq: Every day | ORAL | Status: DC
  Administered 2012-09-13 – 2012-09-15 (×3): 5 mg via ORAL
  Filled 2012-09-13 (×5): qty 1

## 2012-09-13 NOTE — [facility]HH Suicide Risk Assessment (Signed)
Suicide Risk Assessment  Admission Assessment     Nursing information obtained from:  Patient Demographic factors:  Divorced or widowed;Caucasian;Living alone;Unemployed Current Mental Status:  NA Loss Factors:  NA Historical Factors:  Prior suicide attempts Risk Reduction Factors:  Sense of responsibility to family;Positive social support  CLINICAL FACTORS:   Severe Anxiety and/or Agitation Bipolar Disorder:   Mixed State Depression:   Aggression Impulsivity Chronic Pain Previous Psychiatric Diagnoses and Treatments  COGNITIVE FEATURES THAT CONTRIBUTE TO RISK:  Closed-mindedness Polarized thinking    SUICIDE RISK:   Minimal: No identifiable suicidal ideation.  Patients presenting with no risk factors but with morbid ruminations; may be classified as minimal risk based on the severity of the depressive symptoms  PLAN OF CARE:1. Admit for crisis management and stabilization. 2. Medication management to reduce current symptoms to base line and improve the     patient's overall level of functioning 3. Treat health problems as indicated. 4. Develop treatment plan to decrease risk of relapse upon discharge and the need for     readmission. 5. Psycho-social education regarding relapse prevention and self care. 6. Health care follow up as needed for medical problems. 7. Restart home medications where appropriate.   I certify that inpatient services furnished can reasonably be expected to improve the patient's condition.  Thedore Mins, MD 09/13/2012, 11:15 AM
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
## 2012-09-13 NOTE — Progress Notes (Signed)
D: Patient in the hallway requesting to be transferred to the 500 hallway.  Patient states she does not belong on the 400 hall.  Patient states she is not satisfied with her care because she is suppose to program on the 500 hall and writer will not ok her to go over there.  Patient loud, anxious and irritable.  Patient denies SI/HI and denies AVH.  Patient states that other nurses let her go on the 500 hall and patient states writer is changing everything. A: Staff to monitor Q 15 mins for safety.  Encouragement and support offered.  Scheduled medications administered per orders. R: Patient remains safe on the unit.  Patient visible on the unit.  Patient cooperative and taking administered medications.

## 2012-09-13 NOTE — BHH Group Notes (Signed)
BHH LCSW Group Therapy  09/13/2012 1:15 pm  Type of Therapy: Process Group Therapy  Participation Level:  Did not attend    Summary of Progress/Problems: Today's group addressed the issue of overcoming obstacles.  Patients were asked to identify their biggest obstacle post d/c that stands in the way of their on-going success, and then problem solve as to how to manage this.  Daryel Gerald B 09/13/2012   3:11 PM

## 2012-09-13 NOTE — BHH Suicide Risk Assessment (Signed)
[facility] INPATIENT:  Family/Significant Other Suicide Prevention Education  Suicide Prevention Education:  Patient Refusal for Family/Significant Other Suicide Prevention Education: The patient Catherine Olsen has refused to provide written consent for family/significant other to be provided Family/Significant Other Suicide Prevention Education during admission and/or prior to discharge.  Physician notified.  Daryel Gerald B 09/13/2012, 3:16 PM
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
## 2012-09-13 NOTE — BHH Group Notes (Signed)
[facility] LCSW Aftercare Discharge Planning Group Note   09/13/2012 8:06 AM  Participation Quality:  Did not attend    [place]
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
## 2012-09-13 NOTE — Progress Notes (Signed)
D: Pt denies SI/HI/AVH. Per pt,  she lied about feeling SI prior to her admission here at [facility] so that she could get started on a medication that would help her manic episodes. Pt presents with hyperverbal speech, flight of ideas and paranoia. Pt continues to complain about  other pt on the unit. Per pt, "I don't feel like I should be with these people". Pt was informed by Clinical research associate that everyone here is  seeking treatment for various reasons. Pt is fixated on being moved to another hall because she don't feel like she belong with this population of people. Pt has a hard time processing information and continues to ask repetitive questions throughout the shift. Pt concerns have been addressed by the MD and Clinical research associate. Pt continues to express concerns about being discharged tomorrow but then goes on to say that she came here  for clinical observation. A: Medications administered as ordered per MD. Verbal support given. Pt encouraged to attend groups. Pt reassured by Clinical research associate that she is safe here. 15 minute checks performed for safety. R: Pt is impulsive. Fidgety. Irritable.
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
## 2012-09-14 DIAGNOSIS — F3160 Bipolar disorder, current episode mixed, unspecified: Principal | ICD-10-CM

## 2012-09-14 DIAGNOSIS — F4310 Post-traumatic stress disorder, unspecified: Secondary | ICD-10-CM

## 2012-09-14 MED ORDER — NICOTINE 21 MG/24HR TD PT24
21.0000 mg | MEDICATED_PATCH | Freq: Every day | TRANSDERMAL | Status: DC
  Administered 2012-09-15: 21 mg via TRANSDERMAL
  Filled 2012-09-14 (×3): qty 1

## 2012-09-14 NOTE — Progress Notes (Signed)
Writer spoke with patient in her room 1:1 and she reports having had a good day. Patient is hopeful to discharge on tomorrow. She reports that her abilify is working and she feels better. Support and encouragement offered, safety maintained on unit, safety maintained on unit with 15 min checks, will continue to monitor. Patient deneis si/hi/a/v hallucinations
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
## 2012-09-14 NOTE — [facility] Group Notes (Signed)
[facility] LCSW Aftercare Discharge Planning Group Note   09/14/2012 10:30 AM  Participation Quality:  Engaged  Mood/Affect:  Appropriate  Depression Rating:  denies  Anxiety Rating:  denies  Thoughts of Suicide:  No Will you contract for safety?   NA  Current AVH:  No  Plan for Discharge/Comments:  States that she woke up at 2AM and did not sleep soundly after that.  Thinks it is related to the the Abilify, but is overall happy with the medication and hopes to d/c today.  I agitating to get to another hall for groups.  Assured her would would have group this AM at 11:00.  Transportation Means: friend  Supports: friend  [place], [place] B

## 2012-09-14 NOTE — Progress Notes (Signed)
[facility] MD Progress Note  09/14/2012 10:50 AM Catherine Olsen  MRN:  [id number] Subjective:" I need to talk to your director or another doctor, I have nothing against you but I have must be discharged home today." Objective: Patient is demanding that she needs to be discharged home today, she says she does not belong in the hospital and certainly "not with this people". Patient is very argumentative, labile, grandiose and demanding. Patient claims that she is no longer suicidal even though she told the doctor in the ER that she was in crisis, manic, depressed suicidal and had googled how she could kill herself. Patient is oppositional, defiant, intrusive and unable to get along with peers and the staff. She is asking for a second opinion from a different doctor and/or director to determine her discharge date.   Diagnosis:  Axis I: Bipolar I disorder, most recent episode (or current) mixed, unspecified. PTSD                      Axis II: Cluster B traits  ADL's:  Intact  Sleep: Poor  Appetite:  Fair  Suicidal Ideation:  Plan:  denies Intent:  denies Means:  denies Homicidal Ideation:  Plan:  denies Intent:  denies Means:  denies AEB (as evidenced by):  Psychiatric Specialty Exam: Review of Systems  Constitutional: Negative.   HENT: Negative.   Eyes: Negative.   Respiratory: Negative.   Cardiovascular: Negative.   Gastrointestinal: Negative.   Genitourinary: Negative.   Musculoskeletal: Negative.   Skin: Negative.   Neurological: Negative.   Endo/Heme/Allergies: Negative.   Psychiatric/Behavioral: The patient is nervous/anxious and has insomnia.     Blood pressure 112/79, pulse 72, temperature 96.8 F (36 C), temperature source Oral, resp. rate 18, height 5\' 8"  (1.727 m), weight 66.225 kg (146 lb).Body mass index is 22.2 kg/(m^2).  General Appearance: Fairly Groomed  Patent attorney::  Good  Speech:  Pressured and rapid  Volume:  Increased  Mood:  Irritable  Affect:  Labile and Full  Range  Thought Process:  Disorganized  Orientation:  Full (Time, Place, and Person)  Thought Content:  Delusions  Suicidal Thoughts:  No  Homicidal Thoughts:  No  Memory:  Immediate;   Fair Recent;   Fair Remote;   Fair  Judgement:  Poor  Insight:  Lacking  Psychomotor Activity:  Increased  Concentration:  Fair  Recall:  Fair  Akathisia:  No  Handed:  Right  AIMS (if indicated):     Assets:  Communication Skills Social Support  Sleep:  Number of Hours: 3   Current Medications: Current Facility-Administered Medications  Medication Dose Route Frequency Provider Last Rate Last Dose  . acetaminophen (TYLENOL) tablet 650 mg  650 mg Oral Q6H PRN Court Joy, PA-C      . albuterol (PROVENTIL HFA;VENTOLIN HFA) 108 (90 BASE) MCG/ACT inhaler 2 puff  2 puff Inhalation Q6H PRN Court Joy, PA-C      . alum & mag hydroxide-simeth (MAALOX/MYLANTA) 200-200-20 MG/5ML suspension 30 mL  30 mL Oral Q4H PRN Court Joy, PA-C   30 mL at 09/13/12 1833  . ARIPiprazole (ABILIFY) tablet 5 mg  5 mg Oral Daily Jameson Tormey   5 mg at 09/14/12 0752  . celecoxib (CELEBREX) capsule 200 mg  200 mg Oral QHS Court Joy, PA-C   200 mg at 09/13/12 2111  . cholecalciferol (VITAMIN D) tablet 400 Units  400 Units Oral Daily Court Joy, PA-C  400 Units at 09/14/12 0751  . clonazePAM (KLONOPIN) tablet 2 mg  2 mg Oral QHS Court Joy, PA-C   2 mg at 09/13/12 2149  . magnesium hydroxide (MILK OF MAGNESIA) suspension 30 mL  30 mL Oral Daily PRN Court Joy, PA-C      . magnesium oxide (MAG-OX) tablet 400 mg  400 mg Oral Daily Court Joy, PA-C   400 mg at 09/14/12 1610  . nicotine (NICODERM CQ - dosed in mg/24 hours) patch 21 mg  21 mg Transdermal Once Janyia Guion   21 mg at 09/13/12 2149  . traZODone (DESYREL) tablet 50 mg  50 mg Oral QHS PRN,MR X 1 Court Joy, PA-C      . vitamin E capsule 100 Units  100 Units Oral Daily Court Joy, PA-C   100 Units at 09/14/12 9604     Lab Results: No results found for this or any previous visit (from the past 48 hour(s)).  Physical Findings: AIMS: Facial and Oral Movements Muscles of Facial Expression: None, normal Lips and Perioral Area: None, normal Jaw: None, normal Tongue: None, normal,Extremity Movements Upper (arms, wrists, hands, fingers): None, normal Lower (legs, knees, ankles, toes): None, normal, Trunk Movements Neck, shoulders, hips: None, normal, Overall Severity Severity of abnormal movements (highest score from questions above): None, normal Incapacitation due to abnormal movements: None, normal Patient's awareness of abnormal movements (rate only patient's report): No Awareness, Dental Status Current problems with teeth and/or dentures?: No Does patient usually wear dentures?: No  CIWA:    COWS:     Treatment Plan Summary: Daily contact with patient to assess and evaluate symptoms and progress in treatment Medication management  Plan:1. Admit for crisis management and stabilization. 2. Medication management to reduce current symptoms to base line and improve the  patient's overall level of functioning 3. Treat health problems as indicated. 4. Develop treatment plan to decrease risk of relapse upon discharge and the need for  readmission. 5. Psycho-social education regarding relapse prevention and self care. 6. Health care follow up as needed for medical problems. 7. Restart home medications where appropriate. 8. Will continue current medication regimen 9. Will ask Dr. Dub Mikes for second opinion.   Medical Decision Making Problem Points:  Established problem, worsening (2), Review of last therapy session (1) and Review of psycho-social stressors (1) Data Points:  Order Aims Assessment (2) Review of medication regiment & side effects (2) Review of new medications or change in dosage (2)  I certify that inpatient services furnished can reasonably be expected to improve the patient's  condition.   Haydyn Liddell,MD 09/14/2012, 10:50 AM

## 2012-09-14 NOTE — [facility] Group Notes (Signed)
[facility] LCSW Group Therapy  09/14/2012 1:15 pm  Type of Therapy: Process Group Therapy  Participation Level:  Active  Participation Quality:  Appropriate, monopolizing  Affect:  Flat  Cognitive:  Oriented  Insight:  Improving  Engagement in Group:  Limited  Engagement in Therapy:  Limited  Modes of Intervention:  Activity, Clarification, Education, Problem-solving and Support  Summary of Progress/Problems: Today's group addressed the issue of overcoming obstacles.  Patients were asked to identify their biggest obstacle post d/c that stands in the way of their on-going success, and then problem solve as to how to manage this.  I had to move the discussion away from [place] several times as she can easily take over a group.  She identified her fixed income as her biggest obstacle, but then named all these things she is doing to overcome that.  She was given feedback by myself and others that she is doing a nice job.  She enjoyed giving feedback and problem solving with others as well.  Daryel Gerald B 09/14/2012   2:05 PM

## 2012-09-14 NOTE — Progress Notes (Signed)
Patient ID: Catherine Olsen, female   DOB: [birth year], 42 y.o.   MRN: [id number] Met with [facility] as per her request. She states she likes the Abilify and is willing to stay on it. She states she recognized she was manic and needing help. She states that she claimed she was suicidal to get admitted. States she was wanting her medications assessed. She is very hesitant to try new medications due to past experiences. She likes the Abilify and will like to stay on the dosage she is taking now. Would like to be discharged tomorrow. She has regular follow up appointments with Rickey Primus out of Dr. Jae Dire office. She trusts Misty Stanley. She is worried about her insurance paying for the Abilify. Will like to get samples. She will also like to get some nicotine patches as she wants to quit smoking  Plan: Validate and support current treatment plan.  Madie Reno A. Dub Mikes, M.D.

## 2012-09-15 MED ORDER — VITAMIN D 400 UNITS PO CAPS: 400.0000 [IU] | ORAL_CAPSULE | Freq: Every day | ORAL | Status: DC

## 2012-09-15 MED ORDER — CELECOXIB 200 MG PO CAPS: 200.0000 mg | ORAL_CAPSULE | Freq: Every day | ORAL | Status: DC

## 2012-09-15 MED ORDER — CLONAZEPAM 2 MG PO TABS: 2.0000 mg | ORAL_TABLET | Freq: Every day | ORAL | Status: DC

## 2012-09-15 MED ORDER — TRAZODONE HCL 50 MG PO TABS: 50.0000 mg | ORAL_TABLET | Freq: Every evening | ORAL | Status: DC | PRN

## 2012-09-15 MED ORDER — ARIPIPRAZOLE 5 MG PO TABS: 5.0000 mg | ORAL_TABLET | Freq: Every day | ORAL | Status: DC

## 2012-09-15 MED ORDER — VITAMIN E 45 MG (100 UNIT) PO CAPS: 100.0000 [IU] | ORAL_CAPSULE | Freq: Every day | ORAL | Status: DC

## 2012-09-15 MED ORDER — NICOTINE 21 MG/24HR TD PT24: 1.0000 | MEDICATED_PATCH | Freq: Every day | TRANSDERMAL | Status: DC

## 2012-09-15 MED ORDER — MAGNESIUM 300 MG PO CAPS: 1.0000 | ORAL_CAPSULE | Freq: Every day | ORAL | Status: DC

## 2012-09-15 MED ORDER — ALBUTEROL SULFATE HFA 108 (90 BASE) MCG/ACT IN AERS: 2.0000 | INHALATION_SPRAY | Freq: Four times a day (QID) | RESPIRATORY_TRACT | Status: DC | PRN

## 2012-09-15 NOTE — Tx Team (Signed)
  Interdisciplinary Treatment Plan Update   Date Reviewed:  09/15/2012  Time Reviewed:  9:54 AM  Progress in Treatment:   Attending groups: Yes Participating in groups: Yes Taking medication as prescribed: Yes  Tolerating medication: Yes Family/Significant other contact made: No Patient understands diagnosis: Yes  Discussing patient identified problems/goals with staff: Yes Medical problems stabilized or resolved: Yes Denies suicidal/homicidal ideation: Yes  In tx team Patient has not harmed self or others: Yes  For review of initial/current patient goals, please see plan of care.  Estimated Length of Stay:  D/C today  Reason for Continuation of Hospitalization:   New Problems/Goals identified:  N/A  Discharge Plan or Barriers:   return home, follow up outpt  Additional Comments:  Attendees:  Signature: Thedore Mins, MD 09/15/2012 9:54 AM   Signature: Richelle Ito, LCSW 09/15/2012 9:54 AM  Signature: Verne Spurr, PA 09/15/2012 9:54 AM  Signature:  09/15/2012 9:54 AM  Signature: Liborio Nixon, RN 09/15/2012 9:54 AM  Signature:  09/15/2012 9:54 AM  Signature:   09/15/2012 9:54 AM  Signature:    Signature:    Signature:    Signature:    Signature:    Signature:      Scribe for Treatment Team:   Richelle Ito, LCSW  09/15/2012 9:54 AM
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
## 2012-09-15 NOTE — Progress Notes (Signed)
Recreation Therapy Notes  Date: 06.11.2014 Time: 9:30am Location: [address] Dayroom      Group Topic/Focus: Leisure Education  Participation Level: Active  Participation Quality: Appropriate  Affect: Euthymic  Cognitive: Appropriate   Additional Comments: Activity: Leisure Alphabet ; Explanation: LRT wrote the letters of the alphabet on the white board in the dayroom. Patient selected a letter of the alphabet out of a plastic cup that was passed around the room and identified a leisure/recreation activity that started with that letter.   Patient actively participated in group activity. Patient added appropriate activities to group list. Patient shared with group she enjoys exercising. Patient stated she makes a good effort to exercise everyday to combat depressive symptoms she experiences. Patient contributed to discussion about what recreation and leisure is.   Marykay Lex Shweta Aman, LRT/CTRS  Hazelgrace Bonham L 09/15/2012 12:30 PM

## 2012-09-15 NOTE — Progress Notes (Signed)
Discharged note: Pt received both written and verbal discharged instructions. Pt agreed to f/u appt and med regimen. Pt denies SI/HI/AVH. Pt received all belongings from room and locker. Pt received sample meds and prescriptions. Pt safely left the facility with her mother.

## 2012-09-15 NOTE — [facility] Suicide Risk Assessment (Signed)
Suicide Risk Assessment  Discharge Assessment     Demographic Factors:  Low socioeconomic status, Living alone, Unemployed and female  Mental Status Per Nursing Assessment::   On Admission:  NA  Current Mental Status by Physician: patient denies suicidal ideation, intent or plan  Loss Factors: Decrease in vocational status and Financial problems/change in socioeconomic status  Historical Factors: Impulsivity  Risk Reduction Factors:   Sense of responsibility to family, Living with another person, especially a relative and Positive social support  Continued Clinical Symptoms:  Bipolar Disorder:   Mixed State  Cognitive Features That Contribute To Risk:  Closed-mindedness Polarized thinking    Suicide Risk:  Minimal: No identifiable suicidal ideation.  Patients presenting with no risk factors but with morbid ruminations; may be classified as minimal risk based on the severity of the depressive symptoms  Discharge Diagnoses:   AXIS I: Bipolar I disorder, most recent episode (or current) mixed, unspecified  AXIS II:  Cluster B trait AXIS III:   Past Medical History  Diagnosis Date  . Asthma   . Anxiety   . Mental disorder    AXIS IV:  other psychosocial or environmental problems and problems related to social environment AXIS V:  61-70 mild symptoms  Plan Of Care/Follow-up recommendations:  Activity:  as tolerated Diet:  healthy Tests:  routine Other:  patient to keep her after care appointment  Is patient on multiple antipsychotic therapies at discharge:  No   Has Patient had three or more failed trials of antipsychotic monotherapy by history:  No  Recommended Plan for Multiple Antipsychotic Therapies: N/A  Torrey Horseman,MD 09/15/2012, 9:45 AM

## 2012-09-15 NOTE — Progress Notes (Signed)
[facility] Adult Case Management Discharge Plan :  Will you be returning to the same living situation after discharge: Yes,  home At discharge, do you have transportation home?:Yes,  family Do you have the ability to pay for your medications:Yes,  MCD  Release of information consent forms completed and in the chart;  Patient's signature needed at discharge.  Patient to Follow up at: Follow-up Information   Follow up with [facility] On 09/16/2012. (10:30 with Ellis Savage)    Contact information:   [address] [place] [phone]] [phone]      Follow up with Gretchen Short On 09/16/2012. (1:00)    Contact information:   [address]  [place]  [phone]      Patient denies SI/HI:   Yes,  Yes'    Safety Planning and Suicide Prevention discussed:  Yes,  yes  Daryel Gerald B 09/15/2012, 10:01 AM

## 2012-09-16 DIAGNOSIS — F319 Bipolar disorder, unspecified: Secondary | ICD-10-CM | POA: Diagnosis not present

## 2012-09-16 DIAGNOSIS — F4310 Post-traumatic stress disorder, unspecified: Secondary | ICD-10-CM | POA: Diagnosis not present

## 2012-09-16 DIAGNOSIS — F3113 Bipolar disorder, current episode manic without psychotic features, severe: Secondary | ICD-10-CM | POA: Diagnosis not present

## 2012-09-16 NOTE — Discharge Summary (Signed)
Seen and agreed. Seena Ritacco, MD 

## 2012-09-20 NOTE — Progress Notes (Signed)
Patient Discharge Instructions:  After Visit Summary (AVS):   Faxed to:  09/20/12 Discharge Summary Note:   Faxed to:  09/20/12 Psychiatric Admission Assessment Note:   Faxed to:  09/20/12 Suicide Risk Assessment - Discharge Assessment:   Faxed to:  09/20/12 Faxed/Sent to the Next Level Care provider:  09/20/12  Faxed to [facility] @ [phone] Faxed to [facility] @ [phone]  Jerelene Redden, 09/20/2012, 2:13 PM

## 2012-09-21 DIAGNOSIS — F4310 Post-traumatic stress disorder, unspecified: Secondary | ICD-10-CM | POA: Diagnosis not present

## 2012-09-21 DIAGNOSIS — F319 Bipolar disorder, unspecified: Secondary | ICD-10-CM | POA: Diagnosis not present

## 2012-09-23 NOTE — ED Provider Notes (Signed)
Medical screening examination/treatment/procedure(s) were performed by non-physician practitioner and as supervising physician I was immediately available for consultation/collaboration.   Nelia Shi, MD 09/23/12 [phone]

## 2012-10-14 DIAGNOSIS — F4310 Post-traumatic stress disorder, unspecified: Secondary | ICD-10-CM | POA: Diagnosis not present

## 2012-10-14 DIAGNOSIS — F3161 Bipolar disorder, current episode mixed, mild: Secondary | ICD-10-CM | POA: Diagnosis not present

## 2012-11-16 NOTE — Telephone Encounter (Signed)
Unable to close out chart on med 2013.

## 2012-12-09 DIAGNOSIS — F4310 Post-traumatic stress disorder, unspecified: Secondary | ICD-10-CM | POA: Diagnosis not present

## 2012-12-09 DIAGNOSIS — F3161 Bipolar disorder, current episode mixed, mild: Secondary | ICD-10-CM | POA: Diagnosis not present

## 2013-01-03 ENCOUNTER — Encounter (HOSPITAL_COMMUNITY): Payer: Self-pay | Admitting: Emergency Medicine

## 2013-01-03 ENCOUNTER — Emergency Department (HOSPITAL_COMMUNITY)
Admission: EM | Admit: 2013-01-03 | Discharge: 2013-01-03 | Discharge status: Against Medical Advice | Payer: Medicare Other | Attending: Emergency Medicine | Admitting: Emergency Medicine

## 2013-01-03 DIAGNOSIS — J45909 Unspecified asthma, uncomplicated: Secondary | ICD-10-CM | POA: Diagnosis not present

## 2013-01-03 DIAGNOSIS — R1909 Other intra-abdominal and pelvic swelling, mass and lump: Secondary | ICD-10-CM | POA: Insufficient documentation

## 2013-01-03 DIAGNOSIS — R109 Unspecified abdominal pain: Secondary | ICD-10-CM | POA: Diagnosis not present

## 2013-01-03 DIAGNOSIS — F411 Generalized anxiety disorder: Secondary | ICD-10-CM | POA: Insufficient documentation

## 2013-01-03 DIAGNOSIS — F489 Nonpsychotic mental disorder, unspecified: Secondary | ICD-10-CM | POA: Insufficient documentation

## 2013-01-03 DIAGNOSIS — Z79899 Other long term (current) drug therapy: Secondary | ICD-10-CM | POA: Diagnosis not present

## 2013-01-03 DIAGNOSIS — F17200 Nicotine dependence, unspecified, uncomplicated: Secondary | ICD-10-CM | POA: Insufficient documentation

## 2013-01-03 NOTE — ED Notes (Signed)
Pt sts pain in right groin area with swollen area that pt "thinks could be vein or artery"

## 2013-01-04 ENCOUNTER — Encounter (HOSPITAL_COMMUNITY): Payer: Self-pay | Admitting: *Deleted

## 2013-01-04 ENCOUNTER — Emergency Department (HOSPITAL_COMMUNITY)
Admission: EM | Admit: 2013-01-04 | Discharge: 2013-01-04 | Disposition: A | Discharge status: Home / Self Care | Payer: Medicare Other | Attending: Emergency Medicine | Admitting: Emergency Medicine

## 2013-01-04 DIAGNOSIS — Z79899 Other long term (current) drug therapy: Secondary | ICD-10-CM | POA: Diagnosis not present

## 2013-01-04 DIAGNOSIS — Y929 Unspecified place or not applicable: Secondary | ICD-10-CM | POA: Insufficient documentation

## 2013-01-04 DIAGNOSIS — F411 Generalized anxiety disorder: Secondary | ICD-10-CM | POA: Diagnosis not present

## 2013-01-04 DIAGNOSIS — IMO0002 Reserved for concepts with insufficient information to code with codable children: Secondary | ICD-10-CM | POA: Diagnosis not present

## 2013-01-04 DIAGNOSIS — F17200 Nicotine dependence, unspecified, uncomplicated: Secondary | ICD-10-CM | POA: Insufficient documentation

## 2013-01-04 DIAGNOSIS — X58XXXA Exposure to other specified factors, initial encounter: Secondary | ICD-10-CM | POA: Insufficient documentation

## 2013-01-04 DIAGNOSIS — J45909 Unspecified asthma, uncomplicated: Secondary | ICD-10-CM | POA: Insufficient documentation

## 2013-01-04 DIAGNOSIS — Y939 Activity, unspecified: Secondary | ICD-10-CM | POA: Insufficient documentation

## 2013-01-04 DIAGNOSIS — Z8659 Personal history of other mental and behavioral disorders: Secondary | ICD-10-CM | POA: Insufficient documentation

## 2013-01-04 DIAGNOSIS — R1031 Right lower quadrant pain: Secondary | ICD-10-CM

## 2013-01-04 DIAGNOSIS — S76219A Strain of adductor muscle, fascia and tendon of unspecified thigh, initial encounter: Secondary | ICD-10-CM

## 2013-01-04 NOTE — ED Provider Notes (Signed)
CSN: [id number]     Arrival date & time 01/04/13  0548 History   First MD Initiated Contact with Patient 01/04/13 0606     Chief Complaint  Patient presents with  . Groin Pain   (Consider location/radiation/quality/duration/timing/severity/associated sxs/prior Treatment) HPI Comments: Patient is a 42 year old female past medical history significant for asthma, anxiety, tobacco use presented to the emergency department for 3 days of right groin discomfort. Patient states she feels a "lump" under the skin and was concerned it was a "DVT." Patient states it is moderately tender to the touch with no alleviating factors. Patient denies drainage from the site. Denies fevers or chills. Patient has a zero Wells Score Risk of DVT.    Past Medical History  Diagnosis Date  . Asthma   . Anxiety   . Mental disorder    Past Surgical History  Procedure Laterality Date  . Other surgical history     Family History  Problem Relation Age of Onset  . Diabetes Mother   . Hypertension Mother    History  Substance Use Topics  . Smoking status: Current Every Day Smoker -- 1.00 packs/day for 29 years    Types: Cigarettes  . Smokeless tobacco: Never Used  . Alcohol Use: No     Comment: occasionally   OB History   Grav Para Term Preterm Abortions TAB SAB Ect Mult Living   2    2 2     0     Review of Systems  Constitutional: Negative for fever and chills.  Musculoskeletal: Positive for myalgias. Negative for gait problem.  Skin: Negative.     Allergies  Other  Home Medications   Current Outpatient Rx  Name  Route  Sig  Dispense  Refill  . ARIPiprazole (ABILIFY) 5 MG tablet   Oral   Take 2.5 mg by mouth every evening.         . Cholecalciferol (VITAMIN D) 400 UNITS capsule   Oral   Take 1 capsule (400 Units total) by mouth daily. For bone health         . clonazePAM (KLONOPIN) 0.5 MG tablet   Oral   Take 0.25 mg by mouth 2 (two) times daily as needed for anxiety.         .  Magnesium 300 MG CAPS   Oral   Take 1 capsule (300 mg total) by mouth daily. For low magnesium      0   . vitamin E 100 UNIT capsule   Oral   Take 1 capsule (100 Units total) by mouth daily. For vitamin supplement          BP 126/86  Pulse 79  Temp(Src) 97.4 F (36.3 C) (Oral)  Resp 18  SpO2 99%  LMP 01/01/2013 Physical Exam  Constitutional: She is oriented to person, place, and time. She appears well-developed and well-nourished. No distress.  HENT:  Head: Normocephalic and atraumatic.  Right Ear: External ear normal.  Left Ear: External ear normal.  Nose: Nose normal.  Eyes: Conjunctivae are normal.  Neck: Neck supple.  Cardiovascular: Intact distal pulses.   Pulses:      Femoral pulses are 3+ on the right side, and 3+ on the left side.      Dorsalis pedis pulses are 3+ on the right side, and 3+ on the left side.       Posterior tibial pulses are 3+ on the right side, and 3+ on the left side.  Pulmonary/Chest: Effort normal.  Musculoskeletal:  Normal range of motion. She exhibits no edema.       Right hip: She exhibits tenderness. She exhibits normal range of motion, normal strength, no bony tenderness, no swelling, no crepitus, no deformity and no laceration.  Small mildly tender region in R groin. No warmth, erythema, or drainage.   Lymphadenopathy:       Right: No inguinal adenopathy present.       Left: No inguinal adenopathy present.  Neurological: She is alert and oriented to person, place, and time.  Skin: Skin is warm and dry. She is not diaphoretic.  Psychiatric: She has a normal mood and affect.    ED Course  Procedures (including critical care time) Labs Review Labs Reviewed - No data to display Imaging Review No results found.  MDM   1. Inguinal strain, initial encounter   2. Right groin pain    Afebrile, NAD, non-toxic appearing, AAOx4. Pain likely musculoskeletal in nature. No signs of underlying infection, no concern for underlying cellulitis  or abscess. No concern for DVT, does not meet any Wells DVT Criteria, no clinical signs of DVT. Symptomatic care discussed with patient. Return precautions discussed with patient. Patient was advised to follow up with PCP in 1-2 days. Resource guide given. Patient is agreeable to plan. Patient is stable at time of discharge. Patient d/w with Dr. Dierdre Highman, agrees with plan.           Jeannetta Ellis, PA-C 01/04/13 1439

## 2013-01-04 NOTE — ED Notes (Signed)
Pt c/o right groin pain. Pt reports feeling a lump that is tender to touch. Pt also reports right leg numbness. Denies difficulty ambulating. Pt is A&Ox4, respirations equal and unlabored, skin warm and dry

## 2013-01-04 NOTE — ED Provider Notes (Signed)
Medical screening examination/treatment/procedure(s) were performed by non-physician practitioner and as supervising physician I was immediately available for consultation/collaboration.  Sunnie Nielsen, MD 01/04/13 [phone]

## 2013-02-10 ENCOUNTER — Other Ambulatory Visit: Payer: Self-pay

## 2013-02-10 DIAGNOSIS — F3132 Bipolar disorder, current episode depressed, moderate: Secondary | ICD-10-CM | POA: Diagnosis not present

## 2013-02-10 DIAGNOSIS — F4310 Post-traumatic stress disorder, unspecified: Secondary | ICD-10-CM | POA: Diagnosis not present

## 2013-02-10 DIAGNOSIS — F4323 Adjustment disorder with mixed anxiety and depressed mood: Secondary | ICD-10-CM | POA: Diagnosis not present

## 2013-02-15 ENCOUNTER — Encounter (HOSPITAL_COMMUNITY): Payer: Self-pay | Admitting: Emergency Medicine

## 2013-02-15 ENCOUNTER — Emergency Department (HOSPITAL_COMMUNITY)
Admission: EM | Admit: 2013-02-15 | Discharge: 2013-02-15 | Discharge status: Against Medical Advice | Payer: Medicare Other | Attending: Emergency Medicine | Admitting: Emergency Medicine

## 2013-02-15 DIAGNOSIS — R112 Nausea with vomiting, unspecified: Secondary | ICD-10-CM | POA: Diagnosis not present

## 2013-02-15 DIAGNOSIS — F17200 Nicotine dependence, unspecified, uncomplicated: Secondary | ICD-10-CM | POA: Diagnosis not present

## 2013-02-15 DIAGNOSIS — R109 Unspecified abdominal pain: Secondary | ICD-10-CM | POA: Insufficient documentation

## 2013-02-15 NOTE — ED Notes (Signed)
Patient reports having nausea, abdominal cramping that started in the lower abdomen and is now in the right abdomen. Patient reports that she has vomited x 1 .

## 2013-02-16 ENCOUNTER — Emergency Department (HOSPITAL_COMMUNITY): Payer: Medicare Other

## 2013-02-16 ENCOUNTER — Encounter (HOSPITAL_COMMUNITY): Payer: Self-pay | Admitting: Emergency Medicine

## 2013-02-16 ENCOUNTER — Emergency Department (HOSPITAL_COMMUNITY)
Admission: EM | Admit: 2013-02-16 | Discharge: 2013-02-16 | Disposition: A | Discharge status: Home / Self Care | Payer: Medicare Other | Attending: Emergency Medicine | Admitting: Emergency Medicine

## 2013-02-16 ENCOUNTER — Emergency Department (HOSPITAL_COMMUNITY)
Admission: EM | Admit: 2013-02-16 | Discharge: 2013-02-16 | Disposition: A | Discharge status: Home / Self Care | Payer: Medicare Other | Source: Home / Self Care

## 2013-02-16 DIAGNOSIS — Z3202 Encounter for pregnancy test, result negative: Secondary | ICD-10-CM | POA: Insufficient documentation

## 2013-02-16 DIAGNOSIS — R110 Nausea: Secondary | ICD-10-CM | POA: Diagnosis not present

## 2013-02-16 DIAGNOSIS — R1903 Right lower quadrant abdominal swelling, mass and lump: Secondary | ICD-10-CM | POA: Diagnosis not present

## 2013-02-16 DIAGNOSIS — F489 Nonpsychotic mental disorder, unspecified: Secondary | ICD-10-CM | POA: Diagnosis not present

## 2013-02-16 DIAGNOSIS — N83209 Unspecified ovarian cyst, unspecified side: Secondary | ICD-10-CM | POA: Diagnosis not present

## 2013-02-16 DIAGNOSIS — Z79899 Other long term (current) drug therapy: Secondary | ICD-10-CM | POA: Insufficient documentation

## 2013-02-16 DIAGNOSIS — J45909 Unspecified asthma, uncomplicated: Secondary | ICD-10-CM | POA: Insufficient documentation

## 2013-02-16 DIAGNOSIS — F17200 Nicotine dependence, unspecified, uncomplicated: Secondary | ICD-10-CM | POA: Insufficient documentation

## 2013-02-16 DIAGNOSIS — N9489 Other specified conditions associated with female genital organs and menstrual cycle: Secondary | ICD-10-CM | POA: Diagnosis not present

## 2013-02-16 LAB — CBC WITH DIFFERENTIAL/PLATELET
Basophils Absolute: 0 10*3/uL (ref 0.0–0.1)
Eosinophils Relative: 4 % (ref 0–5)
HCT: 40.2 % (ref 36.0–46.0)
Lymphocytes Relative: 17 % (ref 12–46)
Lymphs Abs: 1.8 10*3/uL (ref 0.7–4.0)
MCV: 92.2 fL (ref 78.0–100.0)
Monocytes Absolute: 0.5 10*3/uL (ref 0.1–1.0)
Neutro Abs: 7.7 10*3/uL (ref 1.7–7.7)
Platelets: 198 10*3/uL (ref 150–400)
RBC: 4.36 MIL/uL (ref 3.87–5.11)
RDW: 13.5 % (ref 11.5–15.5)
WBC: 10.4 10*3/uL (ref 4.0–10.5)

## 2013-02-16 LAB — COMPREHENSIVE METABOLIC PANEL
ALT: 10 U/L (ref 0–35)
AST: 13 U/L (ref 0–37)
CO2: 24 mEq/L (ref 19–32)
Chloride: 103 mEq/L (ref 96–112)
GFR calc Af Amer: >90 mL/min (ref >90)
GFR calc non Af Amer: >90 mL/min (ref >90)
Glucose, Bld: 90 mg/dL (ref 70–99)
Sodium: 139 mEq/L (ref 135–145)
Total Bilirubin: 0.6 mg/dL (ref 0.3–1.2)

## 2013-02-16 LAB — URINALYSIS, ROUTINE W REFLEX MICROSCOPIC
Bilirubin Urine: NEGATIVE
Hgb urine dipstick: NEGATIVE
Ketones, ur: NEGATIVE mg/dL
Nitrite: NEGATIVE
Protein, ur: NEGATIVE mg/dL
Urobilinogen, UA: 0.2 mg/dL (ref 0.0–1.0)

## 2013-02-16 LAB — POCT PREGNANCY, URINE: Preg Test, Ur: NEGATIVE

## 2013-02-16 LAB — URINE MICROSCOPIC-ADD ON

## 2013-02-16 MED ORDER — HYDROCODONE-ACETAMINOPHEN 5-325 MG PO TABS: 2.0000 | ORAL_TABLET | ORAL | Status: DC | PRN

## 2013-02-16 MED ORDER — SODIUM CHLORIDE 0.9 % IV BOLUS (SEPSIS)
1000.0000 mL | Freq: Once | INTRAVENOUS | Status: AC
  Administered 2013-02-16: 1000 mL via INTRAVENOUS

## 2013-02-16 NOTE — ED Notes (Signed)
MD at bedside. 

## 2013-02-16 NOTE — ED Provider Notes (Signed)
CSN: [id number]     Arrival date & time 02/16/13  1154 History   First MD Initiated Contact with Patient 02/16/13 1206     Chief Complaint  Patient presents with  . Abdominal Pain  . Nausea   (Consider location/radiation/quality/duration/timing/severity/associated sxs/prior Treatment) HPI Comments: Patient is a 42 year old female who presents with a two-day history of pain in the right lower quadrant. She denies any fevers or chills. She denies any bowel or bladder complaints. Her last period finished approximately one week ago and was normal. She denies the possibility of being pregnant. She states that she has not been sexually active in over a month but denies any new partners.  Patient is a 42 y.o. female presenting with abdominal pain. The history is provided by the patient.  Abdominal Pain Pain location:  RLQ Pain quality: cramping   Pain radiates to:  Does not radiate Pain severity:  Moderate Onset quality:  Gradual Duration:  2 days Timing:  Constant Progression:  Worsening Chronicity:  New Relieved by:  Nothing Worsened by:  Nothing tried Ineffective treatments:  None tried   Past Medical History  Diagnosis Date  . Asthma   . Anxiety   . Mental disorder    Past Surgical History  Procedure Laterality Date  . Other surgical history    . Nose surgery     Family History  Problem Relation Age of Onset  . Diabetes Mother   . Hypertension Mother    History  Substance Use Topics  . Smoking status: Current Some Day Smoker -- 1.00 packs/day for 29 years    Types: Cigarettes  . Smokeless tobacco: Never Used  . Alcohol Use: Yes     Comment: occasionally   OB History   Grav Para Term Preterm Abortions TAB SAB Ect Mult Living   2    2 2     0     Review of Systems  Gastrointestinal: Positive for abdominal pain.  All other systems reviewed and are negative.    Allergies  Other  Home Medications   Current Outpatient Rx  Name  Route  Sig  Dispense  Refill   . ARIPiprazole (ABILIFY) 5 MG tablet   Oral   Take 2.5 mg by mouth every evening.         . Ascorbic Acid (VITAMIN C PO)   Oral   Take 1 tablet by mouth daily.         . CELEBREX 200 MG capsule   Oral   Take 1 capsule by mouth daily.         . COPPER PO   Oral   Take 1 capsule by mouth 2 (two) times a week.         . Magnesium 300 MG CAPS   Oral   Take 1 capsule (300 mg total) by mouth daily. For low magnesium      0   . Multiple Vitamins-Minerals (ZINC PO)   Oral   Take 1 capsule by mouth 2 (two) times a week.         . SELENIUM PO   Oral   Take 1 capsule by mouth every other day.         . temazepam (RESTORIL) 15 MG capsule   Oral   Take 1 capsule by mouth at bedtime as needed for sleep.          . vitamin E 100 UNIT capsule   Oral   Take 1 capsule (100 Units total)  by mouth daily. For vitamin supplement         . VITAMIN K PO   Oral   Take 1 tablet by mouth daily.          BP 103/66  Pulse 73  Temp(Src) 97.7 F (36.5 C) (Oral)  Resp 18  SpO2 100%  LMP 02/01/2013 Physical Exam  Nursing note and vitals reviewed. Constitutional: She is oriented to person, place, and time. She appears well-developed and well-nourished. No distress.  HENT:  Head: Normocephalic and atraumatic.  Neck: Normal range of motion. Neck supple.  Cardiovascular: Normal rate and regular rhythm.  Exam reveals no gallop and no friction rub.   No murmur heard. Pulmonary/Chest: Effort normal and breath sounds normal. No respiratory distress. She has no wheezes.  Abdominal: Soft. Bowel sounds are normal. She exhibits no distension and no mass. There is tenderness.  There is tenderness to palpation in the right lower quadrant with no rebound and no guarding.  Musculoskeletal: Normal range of motion.  Neurological: She is alert and oriented to person, place, and time.  Skin: Skin is warm and dry. She is not diaphoretic.    ED Course  Procedures (including critical  care time) Labs Review Labs Reviewed  CBC WITH DIFFERENTIAL - Abnormal; Notable for the following:    MCHC 36.1 (*)    All other components within normal limits  URINALYSIS, ROUTINE W REFLEX MICROSCOPIC - Abnormal; Notable for the following:    APPearance CLOUDY (*)    Leukocytes, UA SMALL (*)    All other components within normal limits  URINE MICROSCOPIC-ADD ON - Abnormal; Notable for the following:    Squamous Epithelial / LPF FEW (*)    Bacteria, UA FEW (*)    All other components within normal limits  URINE CULTURE  COMPREHENSIVE METABOLIC PANEL  PREGNANCY, URINE  POCT PREGNANCY, URINE   Imaging Review US Transvaginal Non-ob  02/16/2013   CLINICAL DATA:  Abdominal pain  EXAM: TRANSABDOMINAL AND TRANSVAGINAL ULTRASOUND OF PELVIS  TECHNIQUE: Both transabdominal and transvaginal ultrasound examinations of the pelvis were performed. Transabdominal technique was performed for global imaging of the pelvis including uterus, ovaries, adnexal regions, and pelvic cul-de-sac. It was necessary to proceed with endovaginal exam following the transabdominal exam to visualize the right adnexae.  COMPARISON:  CT earlier today.  Pelvic ultrasound 04/04/2012  FINDINGS: Uterus  Measurements: 7.1 x 3.2 x 4.8 cm. No fibroids or other mass visualized.  Endometrium  Thickness: 4 mm in uniform.  No focal abnormality visualized.  Right ovary  Measurements: 6.0 x 3.3 x 5.0 cm. There is a 5.2 x 2.9 x 4.4 cm abnormal solid and cystic lesion within the right ovary the wall is irregular and thick with vascularity. A fluid fluid level is seen centrally.  Left ovary  Measurements: 3.9 x 3.8 x 3.4 cm. 3.3 x 2.0 x 1.7 cm simple cyst.  Other findings  No free fluid.  IMPRESSION: Simple cysts in the left over is likely benign.  Complex cystic mass within the right ovary as described the wall is markedly thickened with vascularity. Neoplasm is not excluded. Findings may represent subacute abscess formation with a thickened  inflammatory wall. Surgical consultation is recommended.   Electronically Signed   By: Maryclare Bean M.D.   On: 02/16/2013 15:17   US Pelvis Complete  02/16/2013   CLINICAL DATA:  Abdominal pain  EXAM: TRANSABDOMINAL AND TRANSVAGINAL ULTRASOUND OF PELVIS  TECHNIQUE: Both transabdominal and transvaginal ultrasound examinations of the pelvis were  performed. Transabdominal technique was performed for global imaging of the pelvis including uterus, ovaries, adnexal regions, and pelvic cul-de-sac. It was necessary to proceed with endovaginal exam following the transabdominal exam to visualize the right adnexae.  COMPARISON:  CT earlier today.  Pelvic ultrasound 04/04/2012  FINDINGS: Uterus  Measurements: 7.1 x 3.2 x 4.8 cm. No fibroids or other mass visualized.  Endometrium  Thickness: 4 mm in uniform.  No focal abnormality visualized.  Right ovary  Measurements: 6.0 x 3.3 x 5.0 cm. There is a 5.2 x 2.9 x 4.4 cm abnormal solid and cystic lesion within the right ovary the wall is irregular and thick with vascularity. A fluid fluid level is seen centrally.  Left ovary  Measurements: 3.9 x 3.8 x 3.4 cm. 3.3 x 2.0 x 1.7 cm simple cyst.  Other findings  No free fluid.  IMPRESSION: Simple cysts in the left over is likely benign.  Complex cystic mass within the right ovary as described the wall is markedly thickened with vascularity. Neoplasm is not excluded. Findings may represent subacute abscess formation with a thickened inflammatory wall. Surgical consultation is recommended.   Electronically Signed   By: Maryclare Bean M.D.   On: 02/16/2013 15:17   Ct Abdomen Pelvis W Contrast  02/16/2013   ADDENDUM REPORT: 02/16/2013 14:05  ADDENDUM: There is slight nonspecific hepatomegaly.   Electronically Signed   By: Geanie Cooley M.D.   On: 02/16/2013 14:05   02/16/2013   CLINICAL DATA:  Abdominal pain in the right groin.  EXAM: CT ABDOMEN AND PELVIS WITH CONTRAST  TECHNIQUE: Multidetector CT imaging of the abdomen and pelvis was  performed using the standard protocol following bolus administration of intravenous contrast.  CONTRAST:  100 cc Omnipaque 300  COMPARISON:  None.  FINDINGS: There is a thick-walled fluid-filled mass in the right adnexum measuring 6.4 x 4.8 x 4.2 cm. There is a 3.6 by 2.1 x 4.2 cm cystic lesion in the left ovary. This has a thin wall. The uterus appears normal. No free fluid in the pelvis.  There is slight prominence of the mucosa of the ascending and proximal transverse portions of the colon with slight soft tissue stranding around the ascending colon. There is the patient have any symptoms of colitis?  The liver, biliary tree, spleen, pancreas, adrenal glands, and kidneys are normal. Terminal ileum and appendix appear normal.  There is no evidence adenopathy or mass or hernia. The muscle structures of the abdomen and pelvis and proximal thighs appear normal.  IMPRESSION: 1. Thick walled cystic lesion in the right adnexum worrisome for an ovarian abscess. Tumor is felt to be less likely because of the thick wall. Probable benign cyst on the left ovary. Pelvic ultrasound recommended for further characterization of the adnexal mass. 2. Slight prominence of the mucosa of the ascending and proximal transverse portions of the colon. This is not definitively abnormal but the possibility of colitis should be considered.  Electronically Signed: By: Geanie Cooley M.D. On: 02/16/2013 13:57      MDM  No diagnosis found. Patient presents with complaints of pain in the right lower quadrant since yesterday. Although her white count was not elevated, her history and exam raised the suspicion of appendicitis. Performed which reveals a cystic mass in the right adnexa suspicious for either an ovarian cyst, ovarian abscess, or possibly a neoplasm.  I spoke with Dr. Jennette Kettle who is her gynecologist and is familiar with her. Due to the absence of fever and leukocytosis, he  feels as though an ovarian abscess is less likely and is  more concerned about a hemorrhagic cyst or neoplasm. She will be discharged to home with followup with Dr. Jennette Kettle in the near future. She has advised me that she will call in the morning and that she would like his opinion as soon as he could possibly see her. She is also requested to forego pelvic examination so that this could be performed by him. She will be discharged home with pain medication and followup.  She's been advised that she is to return to the emergency department if she develops fever, worsening pain, vomiting with an inability to keep her medications down, or she develops any new or bothersome symptoms.  Geoffery Lyons, MD 02/16/13 1550

## 2013-02-16 NOTE — ED Notes (Signed)
asst patient to the bathroom.

## 2013-02-16 NOTE — ED Notes (Signed)
Pt completed oral contrast. CT called.

## 2013-02-16 NOTE — ED Notes (Signed)
Pt c/o abd pain and nausea starting yesterday

## 2013-02-16 NOTE — ED Notes (Signed)
Pt states yesterday. Pt states she felt nauseated. Pt states she felt "like an adhesion" in her abdomen. Pt was unable to go into store. Pt was able to drive herself home.  Pt went to [facility], but left before being seen. Pt states that she went to urgent care and they sent her here, because nurse palpated rebound tenderness and said urgent care does not have the diagnostics to assess her.

## 2013-02-17 LAB — URINE CULTURE

## 2013-02-21 DIAGNOSIS — N83209 Unspecified ovarian cyst, unspecified side: Secondary | ICD-10-CM | POA: Diagnosis not present

## 2013-03-22 DIAGNOSIS — F3132 Bipolar disorder, current episode depressed, moderate: Secondary | ICD-10-CM | POA: Diagnosis not present

## 2013-03-22 DIAGNOSIS — F4310 Post-traumatic stress disorder, unspecified: Secondary | ICD-10-CM | POA: Diagnosis not present

## 2013-03-24 DIAGNOSIS — F4310 Post-traumatic stress disorder, unspecified: Secondary | ICD-10-CM | POA: Diagnosis not present

## 2013-03-24 DIAGNOSIS — F3132 Bipolar disorder, current episode depressed, moderate: Secondary | ICD-10-CM | POA: Diagnosis not present

## 2013-04-11 DIAGNOSIS — N83209 Unspecified ovarian cyst, unspecified side: Secondary | ICD-10-CM | POA: Diagnosis not present

## 2013-04-28 DIAGNOSIS — F3161 Bipolar disorder, current episode mixed, mild: Secondary | ICD-10-CM | POA: Diagnosis not present

## 2013-04-28 DIAGNOSIS — F4310 Post-traumatic stress disorder, unspecified: Secondary | ICD-10-CM | POA: Diagnosis not present

## 2013-05-19 DIAGNOSIS — N921 Excessive and frequent menstruation with irregular cycle: Secondary | ICD-10-CM | POA: Diagnosis not present

## 2013-05-30 DIAGNOSIS — N83209 Unspecified ovarian cyst, unspecified side: Secondary | ICD-10-CM | POA: Diagnosis not present

## 2013-07-02 ENCOUNTER — Inpatient Hospital Stay (HOSPITAL_COMMUNITY): Payer: Medicare Other

## 2013-07-02 ENCOUNTER — Encounter (HOSPITAL_COMMUNITY): Payer: Self-pay

## 2013-07-02 ENCOUNTER — Inpatient Hospital Stay (HOSPITAL_COMMUNITY)
Admission: AD | Admit: 2013-07-02 | Discharge: 2013-07-02 | Discharge status: Against Medical Advice | Payer: Medicare Other | Source: Ambulatory Visit | Attending: Obstetrics and Gynecology | Admitting: Obstetrics and Gynecology

## 2013-07-02 DIAGNOSIS — N80109 Endometriosis of ovary, unspecified side, unspecified depth: Secondary | ICD-10-CM | POA: Diagnosis not present

## 2013-07-02 DIAGNOSIS — D259 Leiomyoma of uterus, unspecified: Secondary | ICD-10-CM | POA: Diagnosis not present

## 2013-07-02 DIAGNOSIS — F17200 Nicotine dependence, unspecified, uncomplicated: Secondary | ICD-10-CM | POA: Diagnosis not present

## 2013-07-02 DIAGNOSIS — N801 Endometriosis of ovary: Secondary | ICD-10-CM

## 2013-07-02 DIAGNOSIS — Z3202 Encounter for pregnancy test, result negative: Secondary | ICD-10-CM | POA: Diagnosis not present

## 2013-07-02 DIAGNOSIS — N809 Endometriosis, unspecified: Secondary | ICD-10-CM | POA: Insufficient documentation

## 2013-07-02 DIAGNOSIS — N949 Unspecified condition associated with female genital organs and menstrual cycle: Secondary | ICD-10-CM | POA: Insufficient documentation

## 2013-07-02 DIAGNOSIS — N80129 Deep endometriosis of ovary, unspecified ovary: Secondary | ICD-10-CM

## 2013-07-02 DIAGNOSIS — N83209 Unspecified ovarian cyst, unspecified side: Secondary | ICD-10-CM | POA: Insufficient documentation

## 2013-07-02 LAB — URINALYSIS, ROUTINE W REFLEX MICROSCOPIC
Bilirubin Urine: NEGATIVE
Glucose, UA: NEGATIVE mg/dL
Hgb urine dipstick: NEGATIVE
Ketones, ur: 40 mg/dL — AB
Leukocytes, UA: NEGATIVE
Nitrite: NEGATIVE
PROTEIN: NEGATIVE mg/dL
Specific Gravity, Urine: 1.015 (ref 1.005–1.030)
Urobilinogen, UA: 0.2 mg/dL (ref 0.0–1.0)
pH: 6 (ref 5.0–8.0)

## 2013-07-02 LAB — WET PREP, GENITAL
Clue Cells Wet Prep HPF POC: NONE SEEN
Trich, Wet Prep: NONE SEEN
Yeast Wet Prep HPF POC: NONE SEEN

## 2013-07-02 LAB — POCT PREGNANCY, URINE: PREG TEST UR: NEGATIVE

## 2013-07-02 MED ORDER — KETOROLAC TROMETHAMINE 60 MG/2ML IM SOLN
60.0000 mg | Freq: Once | INTRAMUSCULAR | Status: DC
  Filled 2013-07-02: qty 2

## 2013-07-02 MED ORDER — KETOROLAC TROMETHAMINE 10 MG PO TABS
10.0000 mg | ORAL_TABLET | Freq: Once | ORAL | Status: DC
  Filled 2013-07-02: qty 1

## 2013-07-02 NOTE — MAU Note (Signed)
"  Endometrium size of golf ball from undiagnosed endometriosis"  Pt said she was prescribed birth control and states she is afraid of it.  Reports had a recent cough and pelvic pain worsened.  Reports today felt like something inside obstructing her from having sex today, states she screamed out in pain when trying.

## 2013-07-02 NOTE — Discharge Instructions (Signed)

## 2013-07-02 NOTE — MAU Provider Note (Signed)
CC: Pelvic Pain    First Provider Initiated Contact with Patient 07/02/13 1940      HPI Catherine Olsen is a 43 y.o. [id number] who presents with known ovarian cysts had sudden onset right pelvic pain 6 days ago after a coughing spell. Remained sore for a few days. Today attempted intercourse and with penetration had severe right pelvic pain. Ultrasound result of 02/16/2013. 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 She was prescibed oral contraceptives, but did not fill Rx. Denies abnormal vaginal bleeding. Menses regular and she is due for onset of menstrual period tomorrow. Condoms for BC. Smoker.    Past Medical History  Diagnosis Date  . Asthma   . Anxiety   . Mental disorder     OB History  Gravida Para Term Preterm AB SAB TAB Ectopic Multiple Living  2    2  2    0    # Outcome Date GA Lbr Len/2nd Weight Sex Delivery Anes PTL Lv  2 TAB           1 TAB               Past Surgical History  Procedure Laterality Date  . Other surgical history    . Nose surgery      History   Social History  . Marital Status: Single    Spouse Name: N/A    Number of Children: N/A  . Years of Education: N/A   Occupational History  . Not on file.   Social History Main Topics  . Smoking status: Current Some Day Smoker -- 0.50 packs/day for 29 years    Types: Cigarettes  . Smokeless tobacco: Never Used  . Alcohol Use: Yes     Comment: "rare"  . Drug Use: No  . Sexual Activity: No     Comment: abortion at 19 and 39yrs. of age    Other Topics Concern  . Not on file   Social History Narrative  . No narrative on file    No current facility-administered medications on file prior to encounter.   Current Outpatient Prescriptions on File Prior to Encounter  Medication Sig Dispense Refill  . ARIPiprazole (ABILIFY) 5 MG tablet Take 2.5 mg by mouth every evening.      . Ascorbic Acid  (VITAMIN C PO) Take 1 tablet by mouth daily.      . CELEBREX 200 MG capsule Take 1 capsule by mouth daily.      . COPPER PO Take 1 capsule by mouth 2 (two) times a week.      . Magnesium 300 MG CAPS Take 1 capsule (300 mg total) by mouth daily. For low magnesium    0  . Multiple Vitamins-Minerals (ZINC PO) Take 1 capsule by mouth 2 (two) times a week.      . SELENIUM PO Take 1 capsule by mouth every other day.      . vitamin E 100 UNIT capsule Take 1 capsule (100 Units total) by mouth daily. For vitamin supplement        Allergies  Allergen Reactions  . Other Anxiety    steroids    ROS Pertinent items in HPI  PHYSICAL EXAM There were no vitals filed for this visit. General: Well nourished, well developed female very anxious, but in no acute distress Cardiovascular: Normal rate Respiratory: Normal effort Abdomen: Soft, mild-mod tenderness right  suprapubic. No rebound or guarding. Back: No CVAT Extremities: No edema Neurologic: Alert and oriented Speculum exam: NEFG; vagina with physiologic discharge, scant brown blood; cervix clean Bimanual exam: cervix closed, minimal CMT; uterus NSSP; rt adnexal tenderness and thickening  Behavior: Very inappropriate behavior through out her stay in MAU. She was wearing and accusing people of not giving her results. Left AMA.  LAB RESULTS Results for orders placed during the hospital encounter of 07/02/13 (from the past 24 hour(s))  POCT PREGNANCY, URINE     Status: None   Collection Time    07/02/13  7:38 PM      Result Value Ref Range   Preg Test, Ur NEGATIVE  NEGATIVE    IMAGING  CLINICAL DATA: Abdominal pain  EXAM:  TRANSABDOMINAL AND TRANSVAGINAL ULTRASOUND OF PELVIS  TECHNIQUE:  Both transabdominal and transvaginal ultrasound examinations of the  pelvis were performed. Transabdominal technique was performed for  global imaging of the pelvis including uterus, ovaries, adnexal  regions, and pelvic cul-de-sac. It was necessary to  proceed with  endovaginal exam following the transabdominal exam to visualize the  right adnexae.  COMPARISON: CT earlier today. Pelvic ultrasound 04/04/2012  FINDINGS:  Uterus  Measurements: 7.1 x 3.2 x 4.8 cm. No fibroids or other mass  visualized.  Endometrium  Thickness: 4 mm in uniform. No focal abnormality visualized.  Right ovary  Measurements: 6.0 x 3.3 x 5.0 cm. There is a 5.2 x 2.9 x 4.4 cm  abnormal solid and cystic lesion within the right ovary the wall is  irregular and thick with vascularity. A fluid fluid level is seen  centrally.  Left ovary  Measurements: 3.9 x 3.8 x 3.4 cm. 3.3 x 2.0 x 1.7 cm simple cyst.  Other findings  No free fluid.  IMPRESSION:  Simple cysts in the left over is likely benign.  Complex cystic mass within the right ovary as described the wall is  markedly thickened with vascularity. Neoplasm is not excluded.  Findings may represent subacute abscess formation with a thickened  inflammatory wall. Surgical consultation is recommended.  Electronically Signed  By: Maryclare Bean M.D.  On: 02/16/2013 15:17    US Transvaginal Non-ob  07/02/2013   CLINICAL DATA:  Abdominal pain, known cyst, evaluate for torsion  EXAM: TRANSVAGINAL ULTRASOUND OF PELVIS  DOPPLER ULTRASOUND OF OVARIES  TECHNIQUE: Transvaginal ultrasound examination of the pelvis was performed including evaluation of the uterus, ovaries, adnexal regions, and pelvic cul-de-sac.  Color and duplex Doppler ultrasound was utilized to evaluate blood flow to the ovaries.  COMPARISON:  Pelvic ultrasound dated 02/16/2013  FINDINGS: Uterus  Measurements: 8.2 x 3.9 x 5.8 cm. 12 x 16 x 12 mm subserosal anterior uterine body fibroid.  Endometrium  Thickness: 8 mm.  No focal abnormality visualized.  Right ovary  Measurements: 7.0 x 5.1 x 5.9 cm. Associated 6.0 x 4.3 x 5.1 cm hemorrhagic cyst versus endometrioma.  Left ovary  Measurements: 4.3 x 3.0 x 2.5 cm. Normal appearance/no adnexal mass.  Pulsed Doppler  evaluation demonstrates normal low-resistance arterial and venous waveforms in both ovaries.  Additional comments:  No pelvic ascites.  IMPRESSION: 6.0 cm right ovarian hemorrhagic cyst versus endometrioma. Consider follow-up pelvic MRI with/without contrast in 4-6 weeks if clinically warranted.  1.6 cm subserosal anterior uterine body fibroid.  No evidence of ovarian torsion.   Electronically Signed   By: Julian Hy M.D.   On: 07/02/2013 20:47   [place] Art/ven Flow Abd Pelv Doppler  07/02/2013   CLINICAL DATA:  Abdominal pain, known cyst, evaluate for torsion  EXAM: TRANSVAGINAL ULTRASOUND OF PELVIS  DOPPLER ULTRASOUND OF OVARIES  TECHNIQUE: Transvaginal ultrasound examination of the pelvis was performed including evaluation of the uterus, ovaries, adnexal regions, and pelvic cul-de-sac.  Color and duplex Doppler ultrasound was utilized to evaluate blood flow to the ovaries.  COMPARISON:  Pelvic ultrasound dated 02/16/2013  FINDINGS: Uterus  Measurements: 8.2 x 3.9 x 5.8 cm. 12 x 16 x 12 mm subserosal anterior uterine body fibroid.  Endometrium  Thickness: 8 mm.  No focal abnormality visualized.  Right ovary  Measurements: 7.0 x 5.1 x 5.9 cm. Associated 6.0 x 4.3 x 5.1 cm hemorrhagic cyst versus endometrioma.  Left ovary  Measurements: 4.3 x 3.0 x 2.5 cm. Normal appearance/no adnexal mass.  Pulsed Doppler evaluation demonstrates normal low-resistance arterial and venous waveforms in both ovaries.  Additional comments:  No pelvic ascites.  IMPRESSION: 6.0 cm right ovarian hemorrhagic cyst versus endometrioma. Consider follow-up pelvic MRI with/without contrast in 4-6 weeks if clinically warranted.  1.6 cm subserosal anterior uterine body fibroid.  No evidence of ovarian torsion.   Electronically Signed   By: Julian Hy M.D.   On: 07/02/2013 20:47     MAU COURSE Awaiting [place]. Care assumed by Monna Fam, NP at 2000. Spoke with Dr Matthew Saras who advised CBC and follow up in office with Dr Nori Riis for  removal of Endometrioma Pt refused CBC and IM Toradol/ would take po Toradol and requested Vicodin for pain    ASSESSMENT   Endometriomas    PLAN  Pt left AMA without medications or instructions Pelvic rest Follow up with Dr Nori Riis Monday for evaluation and removal   Lorene Dy, CNM 07/02/2013 7:55 PM

## 2013-07-02 NOTE — Progress Notes (Signed)
Patient refused Toradol IM and then refused lab work today.  States she is afraid of needles.  Pt agreed to PO pain med.  She was talking about wanting to leave and go home.  I explained we are getting her other results and pain medication. She agreed to stay but when I came back to her room a few minutes later, pt had already left AMA.  I notified Monna Fam NP.

## 2013-07-04 LAB — GC/CHLAMYDIA PROBE AMP
CT Probe RNA: NEGATIVE
GC PROBE AMP APTIMA: NEGATIVE

## 2013-07-11 DIAGNOSIS — N83209 Unspecified ovarian cyst, unspecified side: Secondary | ICD-10-CM | POA: Diagnosis not present

## 2013-07-13 DIAGNOSIS — N8300 Follicular cyst of ovary, unspecified side: Secondary | ICD-10-CM | POA: Diagnosis not present

## 2013-07-13 DIAGNOSIS — Z113 Encounter for screening for infections with a predominantly sexual mode of transmission: Secondary | ICD-10-CM | POA: Diagnosis not present

## 2013-07-27 DIAGNOSIS — F3132 Bipolar disorder, current episode depressed, moderate: Secondary | ICD-10-CM | POA: Diagnosis not present

## 2013-07-27 DIAGNOSIS — F4310 Post-traumatic stress disorder, unspecified: Secondary | ICD-10-CM | POA: Diagnosis not present

## 2013-07-27 DIAGNOSIS — F4323 Adjustment disorder with mixed anxiety and depressed mood: Secondary | ICD-10-CM | POA: Diagnosis not present

## 2013-07-29 ENCOUNTER — Other Ambulatory Visit: Payer: Self-pay | Admitting: Obstetrics & Gynecology

## 2013-08-01 ENCOUNTER — Encounter (HOSPITAL_COMMUNITY): Payer: Self-pay | Admitting: Pharmacist

## 2013-08-08 ENCOUNTER — Encounter (HOSPITAL_COMMUNITY): Payer: Self-pay | Admitting: Emergency Medicine

## 2013-08-08 DIAGNOSIS — R220 Localized swelling, mass and lump, head: Secondary | ICD-10-CM | POA: Insufficient documentation

## 2013-08-08 DIAGNOSIS — R221 Localized swelling, mass and lump, neck: Secondary | ICD-10-CM | POA: Diagnosis not present

## 2013-08-08 NOTE — ED Notes (Signed)
Pt. reports tongue swelling onset this evening , denies injury , respirations unlabored / airway intact .

## 2013-08-09 ENCOUNTER — Encounter (HOSPITAL_COMMUNITY): Payer: Self-pay | Admitting: Emergency Medicine

## 2013-08-09 ENCOUNTER — Emergency Department (HOSPITAL_COMMUNITY)
Admission: EM | Admit: 2013-08-09 | Discharge: 2013-08-09 | Disposition: A | Discharge status: Home / Self Care | Payer: Medicare Other | Attending: Emergency Medicine | Admitting: Emergency Medicine

## 2013-08-09 DIAGNOSIS — R221 Localized swelling, mass and lump, neck: Secondary | ICD-10-CM | POA: Diagnosis not present

## 2013-08-09 DIAGNOSIS — R220 Localized swelling, mass and lump, head: Secondary | ICD-10-CM | POA: Insufficient documentation

## 2013-08-09 HISTORY — DX: Bipolar disorder, unspecified: F31.9

## 2013-08-09 HISTORY — DX: Endometriosis, unspecified: N80.9

## 2013-08-09 NOTE — ED Notes (Signed)
Called patient for reassessment, she did not answer

## 2013-08-09 NOTE — ED Notes (Addendum)
Pt states she her tongue is swelling and throat itching starting yesterday, pt states she was here last night for the same and discharged home due to symptoms had resolved after taking at home Benadryl. Pt states she took another Benadryl this am with no relief. Pt also reports she has had similar episodes in the past but they have never been able to get an actual determine the cause of her allergic reaction. Pt's airway is intact, speaking in complete sentences, pt drinking her water in triage with out any problems

## 2013-08-09 NOTE — ED Notes (Signed)
Pt reports she ate [place] potatoes last night and one of the ingrediants is sulfite, pt also reports when this was diagnosed by an ER dr it was after drinking red wine which also has an indgrediant of sulfite

## 2013-08-09 NOTE — ED Notes (Signed)
Pt called for room without response 

## 2013-08-11 ENCOUNTER — Ambulatory Visit
Admission: RE | Admit: 2013-08-11 | Payer: Medicare Other | Source: Ambulatory Visit | Admitting: Obstetrics & Gynecology

## 2013-08-11 ENCOUNTER — Encounter: Admission: RE | Payer: Self-pay | Source: Ambulatory Visit

## 2013-08-11 SURGERY — LAPAROSCOPY OPERATIVE
Anesthesia: General | Laterality: Right

## 2013-08-24 ENCOUNTER — Ambulatory Visit (INDEPENDENT_AMBULATORY_CARE_PROVIDER_SITE_OTHER): Payer: Medicare Other | Admitting: Emergency Medicine

## 2013-08-24 VITALS — BP 98/60 | HR 74 | Temp 98.4°F | Resp 16 | Ht 67.0 in | Wt 141.0 lb

## 2013-08-24 DIAGNOSIS — K0500 Acute gingivitis, plaque induced: Secondary | ICD-10-CM

## 2013-08-24 MED ORDER — PENICILLIN V POTASSIUM 500 MG PO TABS: 500.0000 mg | ORAL_TABLET | Freq: Four times a day (QID) | ORAL | Status: DC

## 2013-08-24 NOTE — Patient Instructions (Signed)
Gingivitis Gingivitis is a form of gum (periodontal) disease that causes redness, soreness, and swelling (inflammation) of your gums. CAUSES The most common cause of gingivitis is poor oral hygiene. A sticky substance made of bacteria, mucus, and food particles (plaque), is deposited on the exposed part of teeth. As plaque builds up, it reacts with the saliva in your mouth to form something called  tartar. Tartar is a hard deposit that becomes trapped around the base of the tooth. Plaque and tartar irritate the gums, leading to the formation of gingivitis. Other factors that increase your risk for gingivitis include:   Tobacco use.  Diabetes.  Older age.  Certain medications.  Certain viral or fungal infections.  Dry mouth.  Hormonal changes such as during pregnancy.  Poor nutrition.  Substance abuse.  Poor fitting dental restorations or appliances. SYMPTOMS You may notice inflammation of the soft tissue (gingiva) around the teeth. When these tissues become inflamed, they bleed easily, especially during flossing or brushing. The gums may also be:   Tender to the touch.  Bright red, purple red, or have a shiny appearance.  Swollen.  Wearing away from the teeth (receding), which exposes more of the tooth. Bad breath is often present. Continued infection around teeth can eventually cause cavities and loosen teeth. This may lead to eventual tooth loss. DIAGNOSIS A medical and dental history will be taken. Your mouth, teeth, and gums will be examined. Your dentist will look for soft, swollen purple-red, irritated gums. There may be deposits of plaque and tartar at the base of the teeth. Your gums will be looked at for the degree of redness, puffiness, and bleeding tendencies. Your dentist will see if any of the teeth are loose. X-rays may be taken to see if the inflammation has spread to the supporting structures of the teeth. TREATMENT The goal is to reduce and reverse the  inflammation. Proper treatment can usually reverse the symptoms of gingivitis and prevent further progression of the disease. Have your teeth cleaned. During the cleaning, all plaque and tartar will be removed. Instruction for proper home care will be given. You will need regular professional cleanings and check-ups in the future. HOME CARE INSTRUCTIONS  Brush your teeth twice a day and floss at least once per day. When flossing, it is best to floss first then brush.  Limit sugar between meals and maintain a well-balanced diet.  Even the best dental hygiene will not prevent plaque from developing. It is necessary for you to see your dentist on a regular basis for cleaning and regular checkups.  Your dentist can recommend proper oral hygiene and mouth care and suggest special toothpastes or mouth rinses.  Stop smoking. SEEK DENTAL OR MEDICAL CARE IF:  You have painful, reddened tissue around your teeth, or you have puffy swollen gums.  You have difficulty chewing.  You notice any loose or infected teeth.  You have swollen glands.  Your gums bleed easily when you brush your teeth or are very tender to the touch. Document Released: 09/17/2000 Document Revised: 06/16/2011 Document Reviewed: 06/28/2010 [facility] Patient Information 2014 [place].
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
## 2013-08-26 ENCOUNTER — Ambulatory Visit: Payer: Self-pay | Admitting: Family Medicine

## 2013-09-02 ENCOUNTER — Ambulatory Visit (INDEPENDENT_AMBULATORY_CARE_PROVIDER_SITE_OTHER): Payer: Medicare Other | Admitting: Family Medicine

## 2013-09-02 ENCOUNTER — Encounter: Payer: Self-pay | Admitting: Family Medicine

## 2013-09-02 ENCOUNTER — Ambulatory Visit: Payer: Medicare Other

## 2013-09-02 VITALS — BP 100/70 | HR 62 | Temp 97.5°F | Resp 16 | Ht 68.0 in | Wt 137.6 lb

## 2013-09-02 DIAGNOSIS — N801 Endometriosis of ovary: Secondary | ICD-10-CM | POA: Diagnosis not present

## 2013-09-02 DIAGNOSIS — D259 Leiomyoma of uterus, unspecified: Secondary | ICD-10-CM

## 2013-09-02 DIAGNOSIS — Z8249 Family history of ischemic heart disease and other diseases of the circulatory system: Secondary | ICD-10-CM

## 2013-09-02 DIAGNOSIS — Z8709 Personal history of other diseases of the respiratory system: Secondary | ICD-10-CM | POA: Diagnosis not present

## 2013-09-02 DIAGNOSIS — F17200 Nicotine dependence, unspecified, uncomplicated: Secondary | ICD-10-CM

## 2013-09-02 DIAGNOSIS — N80129 Deep endometriosis of ovary, unspecified ovary: Secondary | ICD-10-CM

## 2013-09-02 DIAGNOSIS — Z01818 Encounter for other preprocedural examination: Secondary | ICD-10-CM

## 2013-09-02 DIAGNOSIS — N80109 Endometriosis of ovary, unspecified side, unspecified depth: Secondary | ICD-10-CM

## 2013-09-02 DIAGNOSIS — J40 Bronchitis, not specified as acute or chronic: Secondary | ICD-10-CM

## 2013-09-02 LAB — POCT URINALYSIS DIPSTICK
Bilirubin, UA: NEGATIVE
Glucose, UA: NEGATIVE
Ketones, UA: NEGATIVE
Leukocytes, UA: NEGATIVE
Nitrite, UA: NEGATIVE
Protein, UA: NEGATIVE
Spec Grav, UA: <=1.005
Urobilinogen, UA: 0.2
pH, UA: 5.5

## 2013-09-02 LAB — POCT UA - MICROSCOPIC ONLY
Casts, Ur, LPF, POC: NEGATIVE
Crystals, Ur, HPF, POC: NEGATIVE
Mucus, UA: POSITIVE
Yeast, UA: NEGATIVE

## 2013-09-02 MED ORDER — ALBUTEROL SULFATE HFA 108 (90 BASE) MCG/ACT IN AERS: 1.0000 | INHALATION_SPRAY | Freq: Four times a day (QID) | RESPIRATORY_TRACT | Status: DC | PRN

## 2013-09-02 NOTE — Progress Notes (Signed)
Chief Complaint:  Chief Complaint  Patient presents with  . surgery clearance    HPI: Catherine Olsen is a 43 y.o. female who is here for  Surgery clearance for endometrial surgery,  She canceled her appt since she wanted to know if she would have problems with anesthesia, needs to reschedule She is afraid of general anesthesia She has been a smoker for 30 years. Currently active, doing well without cardiac or pulmonary sxs, has not problems walking up stairs or hiking inclines Her Grandfather  was 43 when he had his MI, he as well except for smoking She has a history of allergies She has had inhalers in the past, it has been a couple of years She has never been foramlly dx with asthma, does have aocc allergies LMP 7 days ago, she is on PCN for ginigivits  Past Medical History  Diagnosis Date  . Asthma   . Anxiety   . Mental disorder   . Bipolar 1 disorder   . Endometriosis    Past Surgical History  Procedure Laterality Date  . Other surgical history    . Nose surgery     History   Social History  . Marital Status: Single    Spouse Name: N/A    Number of Children: N/A  . Years of Education: N/A   Social History Main Topics  . Smoking status: Current Some Day Smoker -- 0.50 packs/day for 29 years    Types: Cigarettes  . Smokeless tobacco: Never Used  . Alcohol Use: Yes     Comment: "rare"  . Drug Use: No  . Sexual Activity: No     Comment: abortion at 27 and 34yrs. of age    Other Topics Concern  . None   Social History Narrative  . None   Family History  Problem Relation Age of Onset  . Diabetes Mother   . Hypertension Mother    Allergies  Allergen Reactions  . Sulfa Antibiotics Hives  . Other Anxiety    steroids   Prior to Admission medications   Medication Sig Start Date End Date Taking? Authorizing Provider  COPPER PO Take 1 capsule by mouth 2 (two) times a week.   Yes Historical Provider, MD  diphenhydrAMINE (BENADRYL) 25 MG tablet Take 25  mg by mouth every 8 (eight) hours as needed for itching.   Yes Historical Provider, MD  Magnesium 250 MG TABS Take 250 mg by mouth daily.    Yes Historical Provider, MD  Multiple Vitamin (MULTIVITAMIN WITH MINERALS) TABS tablet Take 1 tablet by mouth daily.   Yes Historical Provider, MD  penicillin v potassium (VEETID) 500 MG tablet Take 1 tablet (500 mg total) by mouth 4 (four) times daily. 08/24/13  Yes Ellison Carwin, MD  PRESCRIPTION MEDICATION Chlorohexadine mouthwash for gingivitis   Yes Historical Provider, MD  SELENIUM PO Take 1 capsule by mouth every other day.   Yes Historical Provider, MD  zinc sulfate 220 MG capsule Take 220 mg by mouth 2 (two) times a week.   Yes Historical Provider, MD     ROS: The patient denies fevers, chills, night sweats, unintentional weight loss, chest pain, palpitations, wheezing, dyspnea on exertion, nausea, vomiting, abdominal pain, dysuria, hematuria, melena, numbness, weakness, or tingling.   All other systems have been reviewed and were otherwise negative with the exception of those mentioned in the HPI and as above.    PHYSICAL EXAM: Filed Vitals:   09/02/13 0837  BP:  100/70  Pulse: 62  Temp: 97.5 F (36.4 C)  Resp: 16  Spo2 99% Filed Vitals:   09/02/13 0837  Height: 5\' 8"  (1.727 m)  Weight: 137 lb 9.6 oz (62.415 kg)  SPo2 99% Body mass index is 20.93 kg/(m^2).  General: Alert, no acute distress HEENT:  Normocephalic, atraumatic, oropharynx patent. EOMI, PERRLA Cardiovascular:  Regular rate and rhythm, no rubs murmurs or gallops.  No Carotid bruits, radial pulse intact. No pedal edema.  Respiratory: Clear to auscultation bilaterally.  + minimal expiratory wheezes intermittently , rales, or rhonchi.  No cyanosis, no use of accessory musculature GI: No organomegaly, abdomen is soft and non-tender, positive bowel sounds.  No masses. Skin: No rashes. Neurologic: Facial musculature symmetric. Psychiatric: Patient is appropriate throughout  our interaction. Lymphatic: No cervical lymphadenopathy Musculoskeletal: Gait intact.   LABS: Results for orders placed in visit on 09/02/13  POCT URINALYSIS DIPSTICK      Result Value Ref Range   Color, UA yellow     Clarity, UA clear     Glucose, UA neg     Bilirubin, UA neg     Ketones, UA neg     Spec Grav, UA <=1.005     Blood, UA mod     pH, UA 5.5     Protein, UA neg     Urobilinogen, UA 0.2     Nitrite, UA neg     Leukocytes, UA Negative    POCT UA - MICROSCOPIC ONLY      Result Value Ref Range   WBC, Ur, HPF, POC 1-3     RBC, urine, microscopic 3-6     Bacteria, U Microscopic 2+     Mucus, UA pos     Epithelial cells, urine per micros 15-25     Crystals, Ur, HPF, POC neg     Casts, Ur, LPF, POC neg     Yeast, UA neg       EKG/XRAY:   Primary read interpreted by Dr. Marin Comment at [facility].  Increase perihilar vascular markings Please comment if right perilar area is density /infiltrate No pneumo, effusion   ASSESSMENT/PLAN: Encounter Diagnoses  Name Primary?  . Uterine fibroid Yes  . Tobacco dependence   . Endometrioma of ovary   . History of asthma   . Family history of heart disease   . Preoperative clearance   . Bronchitis, not specified as acute or chronic    Pleasant 43 y./o female with a h/o tobacco dependence  She is doing well overall except has endometrioma and is planning to have surgery  She is afraid of anesthesia complications and wanted to get her lungs and heart checked out before surgery Chest xray looks WNL for smoker and so does spirometry, her spirometry shows some mild-mod obstruction and is most likely from smoking.  Advise to stop smoking which will get her spirometry up to normal Urine was neg for infection, she just finished menses Advise to stop smoking  She declined any blood draws due to fear of needles. She decliend to be refer to pulmonology for further eval after discussion, she is not symptomatic.  She is not sxs so I think her  risk is low considering she is completely asymptomatic and  her tobacco history.   Official chest xray results below: CLINICAL DATA: Asthma.  EXAM:  CHEST 2 VIEW  COMPARISON: None.  FINDINGS:  Mediastinum and hilar structures are normal. Lungs are clear of  acute infiltrates. Mild pectus deformity noted. No pleural  effusion  or pneumothorax. Borderline cardiomegaly. Normal pulmonary  vascularity. No acute osseus abnormality.  IMPRESSION:  1. A Borderline cardiomegaly.  2. Mild pectus deformity. No focal pulmonary infiltrate.  Electronically Signed  By: Marcello Moores Register  On: 09/02/2013 10:41 CLINICAL DATA: Asthma.  EXAM:  CHEST 2 VIEW  COMPARISON: None.  FINDINGS:  Mediastinum and hilar structures are normal. Lungs are clear of  acute infiltrates. Mild pectus deformity noted. No pleural effusion  or pneumothorax. Borderline cardiomegaly. Normal pulmonary  vascularity. No acute osseus abnormality.  IMPRESSION:  1. A Borderline cardiomegaly.  2. Mild pectus deformity. No focal pulmonary infiltrate.  Electronically Signed  By: Marcello Moores Register  On: 09/02/2013 10:41    Gross sideeffects, risk and benefits, and alternatives of medications d/w patient. Patient is aware that all medications have potential sideeffects and we are unable to predict every sideeffect or drug-drug interaction that may occur.  Glenford Bayley, DO 09/02/2013 2:51 PM

## 2013-09-06 ENCOUNTER — Telehealth: Payer: Self-pay

## 2013-09-06 ENCOUNTER — Other Ambulatory Visit: Payer: Self-pay | Admitting: Obstetrics & Gynecology

## 2013-09-06 NOTE — Telephone Encounter (Signed)
Message copied by Ramiro Harvest on Tue Sep 06, 2013  8:56 AM ------      Message from: Rikki Spearing P      Created: Mon Sep 05, 2013  2:13 PM       Greetings!            Can you place her spirometry test in my box in the doctor's lounge.            Thanks            Dr Marin Comment ------

## 2013-09-06 NOTE — Telephone Encounter (Signed)
Amy Littrell will check the 102 Spirometry machine to see if it was done up here.  It was not done at [facility].

## 2013-09-19 ENCOUNTER — Encounter (HOSPITAL_COMMUNITY): Payer: Self-pay | Admitting: Pharmacist

## 2013-09-19 NOTE — Patient Instructions (Signed)
   Your procedure is scheduled on:  Monday, June 22  Enter through the Main Entrance of [facility] at: 11:30 AM Pick up the phone at the desk and dial [phone] and inform us of your arrival.  Please call this number if you have any problems the morning of surgery: [phone]  Remember: Do not eat food after midnight: Sunday Do not drink clear liquids after: 9 AM Monday, day of surgery Take these medicines the morning of surgery with a SIP OF WATER:  Do not wear jewelry, make-up, or FINGER nail polish No metal in your hair or on your body. Do not wear lotions, powders, perfumes.  You may wear deodorant.  Do not bring valuables to the hospital. Contacts, dentures or bridgework may not be worn into surgery.  Patients discharged on the day of surgery will not be allowed to drive home.

## 2013-09-21 ENCOUNTER — Other Ambulatory Visit (HOSPITAL_COMMUNITY): Payer: Self-pay

## 2013-09-21 NOTE — Telephone Encounter (Signed)
Trying to close chart since 2013.

## 2013-09-22 ENCOUNTER — Inpatient Hospital Stay (HOSPITAL_COMMUNITY)
Admission: RE | Admit: 2013-09-22 | Discharge: 2013-09-22 | Disposition: A | Discharge status: Home / Self Care | Payer: Self-pay | Source: Ambulatory Visit

## 2013-09-29 ENCOUNTER — Ambulatory Visit (INDEPENDENT_AMBULATORY_CARE_PROVIDER_SITE_OTHER): Payer: Medicare Other | Admitting: General Surgery

## 2013-10-12 DIAGNOSIS — F4323 Adjustment disorder with mixed anxiety and depressed mood: Secondary | ICD-10-CM | POA: Diagnosis not present

## 2013-10-12 DIAGNOSIS — F4310 Post-traumatic stress disorder, unspecified: Secondary | ICD-10-CM | POA: Diagnosis not present

## 2013-10-18 DIAGNOSIS — F4323 Adjustment disorder with mixed anxiety and depressed mood: Secondary | ICD-10-CM | POA: Diagnosis not present

## 2013-10-18 DIAGNOSIS — F4310 Post-traumatic stress disorder, unspecified: Secondary | ICD-10-CM | POA: Diagnosis not present

## 2013-10-20 DIAGNOSIS — F4323 Adjustment disorder with mixed anxiety and depressed mood: Secondary | ICD-10-CM | POA: Diagnosis not present

## 2013-10-20 DIAGNOSIS — F4310 Post-traumatic stress disorder, unspecified: Secondary | ICD-10-CM | POA: Diagnosis not present

## 2013-10-21 DIAGNOSIS — N83209 Unspecified ovarian cyst, unspecified side: Secondary | ICD-10-CM | POA: Diagnosis not present

## 2013-10-21 DIAGNOSIS — N809 Endometriosis, unspecified: Secondary | ICD-10-CM | POA: Diagnosis not present

## 2013-10-24 ENCOUNTER — Encounter (HOSPITAL_COMMUNITY): Payer: Self-pay | Admitting: Emergency Medicine

## 2013-10-24 ENCOUNTER — Emergency Department (INDEPENDENT_AMBULATORY_CARE_PROVIDER_SITE_OTHER)
Admission: EM | Admit: 2013-10-24 | Discharge: 2013-10-24 | Disposition: A | Discharge status: Home / Self Care | Payer: Medicare Other | Source: Home / Self Care | Attending: Family Medicine | Admitting: Family Medicine

## 2013-10-24 DIAGNOSIS — R1031 Right lower quadrant pain: Secondary | ICD-10-CM | POA: Diagnosis not present

## 2013-10-24 DIAGNOSIS — R1011 Right upper quadrant pain: Secondary | ICD-10-CM | POA: Diagnosis not present

## 2013-10-24 MED ORDER — ACETAMINOPHEN 325 MG PO TABS
ORAL_TABLET | ORAL | Status: AC
  Filled 2013-10-24: qty 2

## 2013-10-24 MED ORDER — ACETAMINOPHEN 325 MG PO TABS: 650.0000 mg | ORAL_TABLET | Freq: Once | ORAL | Status: DC
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
## 2013-10-25 NOTE — ED Provider Notes (Signed)
Medical screening examination/treatment/procedure(s) were performed by resident physician or non-physician practitioner and as supervising physician I was immediately available for consultation/collaboration.   Pauline Good MD.   Billy Fischer, MD 10/25/13 1131

## 2013-11-09 ENCOUNTER — Encounter (HOSPITAL_COMMUNITY): Payer: Self-pay | Admitting: *Deleted

## 2013-11-10 ENCOUNTER — Encounter (HOSPITAL_COMMUNITY)
Admission: RE | Admit: 2013-11-10 | Discharge: 2013-11-10 | Disposition: A | Discharge status: Home / Self Care | Payer: Medicare Other | Source: Ambulatory Visit | Attending: Obstetrics & Gynecology | Admitting: Obstetrics & Gynecology

## 2013-11-10 ENCOUNTER — Encounter (HOSPITAL_COMMUNITY): Payer: Self-pay | Admitting: Pharmacy Technician

## 2013-11-10 ENCOUNTER — Encounter (HOSPITAL_COMMUNITY): Payer: Self-pay

## 2013-11-10 ENCOUNTER — Encounter (HOSPITAL_COMMUNITY): Payer: Self-pay | Admitting: Anesthesiology

## 2013-11-10 DIAGNOSIS — N949 Unspecified condition associated with female genital organs and menstrual cycle: Secondary | ICD-10-CM | POA: Diagnosis not present

## 2013-11-10 DIAGNOSIS — IMO0001 Reserved for inherently not codable concepts without codable children: Secondary | ICD-10-CM | POA: Diagnosis not present

## 2013-11-10 DIAGNOSIS — Z8249 Family history of ischemic heart disease and other diseases of the circulatory system: Secondary | ICD-10-CM | POA: Diagnosis not present

## 2013-11-10 DIAGNOSIS — N83209 Unspecified ovarian cyst, unspecified side: Secondary | ICD-10-CM | POA: Diagnosis not present

## 2013-11-10 DIAGNOSIS — F17200 Nicotine dependence, unspecified, uncomplicated: Secondary | ICD-10-CM | POA: Diagnosis not present

## 2013-11-10 DIAGNOSIS — Z833 Family history of diabetes mellitus: Secondary | ICD-10-CM | POA: Diagnosis not present

## 2013-11-10 DIAGNOSIS — J45909 Unspecified asthma, uncomplicated: Secondary | ICD-10-CM | POA: Diagnosis not present

## 2013-11-10 DIAGNOSIS — F341 Dysthymic disorder: Secondary | ICD-10-CM | POA: Diagnosis not present

## 2013-11-10 DIAGNOSIS — M4120 Other idiopathic scoliosis, site unspecified: Secondary | ICD-10-CM | POA: Diagnosis not present

## 2013-11-10 HISTORY — DX: Reserved for concepts with insufficient information to code with codable children: IMO0002

## 2013-11-10 HISTORY — DX: Scoliosis, unspecified: M41.9

## 2013-11-10 HISTORY — DX: Major depressive disorder, single episode, unspecified: F32.9

## 2013-11-10 HISTORY — DX: Headache: R51

## 2013-11-10 HISTORY — DX: Other specified postprocedural states: Z98.890

## 2013-11-10 HISTORY — DX: Unspecified osteoarthritis, unspecified site: M19.90

## 2013-11-10 HISTORY — DX: Nausea with vomiting, unspecified: R11.2

## 2013-11-10 HISTORY — DX: Depression, unspecified: F32.A

## 2013-11-10 LAB — CBC
HCT: 42.7 % (ref 36.0–46.0)
Hemoglobin: 15.2 g/dL — ABNORMAL HIGH (ref 12.0–15.0)
MCH: 32.7 pg (ref 26.0–34.0)
MCHC: 35.6 g/dL (ref 30.0–36.0)
MCV: 91.8 fL (ref 78.0–100.0)
PLATELETS: 203 10*3/uL (ref 150–400)
RBC: 4.65 MIL/uL (ref 3.87–5.11)
RDW: 13.9 % (ref 11.5–15.5)
WBC: 9.4 10*3/uL (ref 4.0–10.5)

## 2013-11-10 NOTE — Patient Instructions (Addendum)
   Your procedure is scheduled on:  Friday, August 7  Enter through the [employer] [facility] at: 6 AM Pick up the phone at the desk and dial [phone] and inform us of your arrival.  Please call this number if you have any problems the morning of surgery: [phone]  Remember: Do not eat or drink after midnight:  Thursday Take these medicines the morning of surgery with a SIP OF WATER: klonopin.  Bring albuterol inhaler with you on day of surgery.  Do not wear jewelry, make-up, or FINGER nail polish No metal in your hair or on your body. Do not wear lotions, powders, perfumes.  You may wear deodorant.  Do not bring valuables to the hospital. Contacts, dentures or bridgework may not be worn into surgery.  Patients discharged on the day of surgery will not be allowed to drive home.  Home with dad Catherine Olsen or [facility] [phone].

## 2013-11-10 NOTE — Anesthesia Preprocedure Evaluation (Addendum)
Anesthesia Evaluation    Reviewed: Allergy & Precautions, H&P , Patient's Chart, lab work & pertinent test results  Airway Mallampati: I TM Distance: >3 FB Neck ROM: full    Dental no notable dental hx. (+) Teeth Intact   Pulmonary Current Smoker,    Pulmonary exam normal       Cardiovascular negative cardio ROS      Neuro/Psych PSYCHIATRIC DISORDERS Anxiety Depression Bipolar Disorder    GI/Hepatic negative GI ROS, Neg liver ROS,   Endo/Other  negative endocrine ROS  Renal/GU negative Renal ROS     Musculoskeletal   Abdominal Normal abdominal exam  (+)   Peds  Hematology negative hematology ROS (+)   Anesthesia Other Findings   Reproductive/Obstetrics negative OB ROS                          Anesthesia Physical Anesthesia Plan  ASA: II  Anesthesia Plan: General   Post-op Pain Management:    Induction: Intravenous  Airway Management Planned: Oral ETT  Additional Equipment:   Intra-op Plan:   Post-operative Plan: Extubation in OR  Informed Consent: I have reviewed the patients History and Physical, chart, labs and discussed the procedure including the risks, benefits and alternatives for the proposed anesthesia with the patient or authorized representative who has indicated his/her understanding and acceptance.     Plan Discussed with: CRNA and Surgeon  Anesthesia Plan Comments:         Anesthesia Quick Evaluation

## 2013-11-10 NOTE — Pre-Procedure Instructions (Signed)
Dr Jillyn Hidden spoke with patient at PAT appt.  [place] for surgery.

## 2013-11-11 ENCOUNTER — Ambulatory Visit (HOSPITAL_COMMUNITY): Payer: Medicare Other | Admitting: Anesthesiology

## 2013-11-11 ENCOUNTER — Encounter (HOSPITAL_COMMUNITY): Payer: Medicare Other | Admitting: Anesthesiology

## 2013-11-11 ENCOUNTER — Ambulatory Visit (HOSPITAL_COMMUNITY)
Admission: RE | Admit: 2013-11-11 | Discharge: 2013-11-11 | Disposition: A | Discharge status: Home / Self Care | Payer: Medicare Other | Source: Ambulatory Visit | Attending: Obstetrics & Gynecology | Admitting: Obstetrics & Gynecology

## 2013-11-11 ENCOUNTER — Encounter (HOSPITAL_COMMUNITY)
Admission: RE | Disposition: A | Discharge status: Home / Self Care | Payer: Self-pay | Source: Ambulatory Visit | Attending: Obstetrics & Gynecology

## 2013-11-11 DIAGNOSIS — M4120 Other idiopathic scoliosis, site unspecified: Secondary | ICD-10-CM | POA: Insufficient documentation

## 2013-11-11 DIAGNOSIS — R899 Unspecified abnormal finding in specimens from other organs, systems and tissues: Secondary | ICD-10-CM | POA: Diagnosis not present

## 2013-11-11 DIAGNOSIS — N809 Endometriosis, unspecified: Secondary | ICD-10-CM | POA: Diagnosis not present

## 2013-11-11 DIAGNOSIS — N83209 Unspecified ovarian cyst, unspecified side: Secondary | ICD-10-CM | POA: Diagnosis not present

## 2013-11-11 DIAGNOSIS — F17200 Nicotine dependence, unspecified, uncomplicated: Secondary | ICD-10-CM | POA: Insufficient documentation

## 2013-11-11 DIAGNOSIS — IMO0001 Reserved for inherently not codable concepts without codable children: Secondary | ICD-10-CM | POA: Insufficient documentation

## 2013-11-11 DIAGNOSIS — J45909 Unspecified asthma, uncomplicated: Secondary | ICD-10-CM | POA: Insufficient documentation

## 2013-11-11 DIAGNOSIS — N949 Unspecified condition associated with female genital organs and menstrual cycle: Secondary | ICD-10-CM | POA: Diagnosis not present

## 2013-11-11 DIAGNOSIS — Z833 Family history of diabetes mellitus: Secondary | ICD-10-CM | POA: Insufficient documentation

## 2013-11-11 DIAGNOSIS — F341 Dysthymic disorder: Secondary | ICD-10-CM | POA: Insufficient documentation

## 2013-11-11 DIAGNOSIS — Z8249 Family history of ischemic heart disease and other diseases of the circulatory system: Secondary | ICD-10-CM | POA: Insufficient documentation

## 2013-11-11 HISTORY — PX: LAPAROSCOPY: SHX197

## 2013-11-11 LAB — HCG, SERUM, QUALITATIVE: Preg, Serum: NEGATIVE

## 2013-11-11 SURGERY — LAPAROSCOPY OPERATIVE
Anesthesia: General | Site: Abdomen | Laterality: Right

## 2013-11-11 MED ORDER — SCOPOLAMINE 1 MG/3DAYS TD PT72
1.0000 | MEDICATED_PATCH | Freq: Once | TRANSDERMAL | Status: DC
  Administered 2013-11-11: 1.5 mg via TRANSDERMAL

## 2013-11-11 MED ORDER — OXYCODONE-ACETAMINOPHEN 5-325 MG PO TABS: 1.0000 | ORAL_TABLET | Freq: Four times a day (QID) | ORAL | Status: DC | PRN

## 2013-11-11 MED ORDER — FENTANYL CITRATE 0.05 MG/ML IJ SOLN
INTRAMUSCULAR | Status: DC | PRN
  Administered 2013-11-11: 100 ug via INTRAVENOUS
  Administered 2013-11-11 (×3): 50 ug via INTRAVENOUS
  Administered 2013-11-11: 100 ug via INTRAVENOUS

## 2013-11-11 MED ORDER — FENTANYL CITRATE 0.05 MG/ML IJ SOLN
INTRAMUSCULAR | Status: AC
  Filled 2013-11-11: qty 5

## 2013-11-11 MED ORDER — PROPOFOL 10 MG/ML IV EMUL
INTRAVENOUS | Status: AC
  Filled 2013-11-11: qty 20

## 2013-11-11 MED ORDER — BUPIVACAINE HCL (PF) 0.25 % IJ SOLN
INTRAMUSCULAR | Status: DC | PRN
  Administered 2013-11-11: 16 mL
  Administered 2013-11-11: 14 mL

## 2013-11-11 MED ORDER — FENTANYL CITRATE 0.05 MG/ML IJ SOLN
INTRAMUSCULAR | Status: AC
  Filled 2013-11-11: qty 2

## 2013-11-11 MED ORDER — NEOSTIGMINE METHYLSULFATE 10 MG/10ML IV SOLN
INTRAVENOUS | Status: AC
  Filled 2013-11-11: qty 1

## 2013-11-11 MED ORDER — FENTANYL CITRATE 0.05 MG/ML IJ SOLN
25.0000 ug | INTRAMUSCULAR | Status: DC | PRN
  Administered 2013-11-11: 50 ug via INTRAVENOUS
  Administered 2013-11-11: 25 ug via INTRAVENOUS

## 2013-11-11 MED ORDER — GLYCOPYRROLATE 0.2 MG/ML IJ SOLN
INTRAMUSCULAR | Status: DC | PRN
  Administered 2013-11-11: 0.6 mg via INTRAVENOUS

## 2013-11-11 MED ORDER — BUPIVACAINE HCL (PF) 0.25 % IJ SOLN
INTRAMUSCULAR | Status: AC
Start: 2013-11-11 — End: 2013-11-11
  Filled 2013-11-11: qty 30

## 2013-11-11 MED ORDER — LIDOCAINE HCL (CARDIAC) 20 MG/ML IV SOLN
INTRAVENOUS | Status: AC
  Filled 2013-11-11: qty 5

## 2013-11-11 MED ORDER — IBUPROFEN 200 MG PO TABS: 600.0000 mg | ORAL_TABLET | Freq: Four times a day (QID) | ORAL | Status: DC | PRN

## 2013-11-11 MED ORDER — DEXAMETHASONE SODIUM PHOSPHATE 10 MG/ML IJ SOLN
INTRAMUSCULAR | Status: AC
  Filled 2013-11-11: qty 1

## 2013-11-11 MED ORDER — KETOROLAC TROMETHAMINE 30 MG/ML IJ SOLN
INTRAMUSCULAR | Status: DC | PRN
  Administered 2013-11-11: 30 mg via INTRAVENOUS

## 2013-11-11 MED ORDER — MEPERIDINE HCL 25 MG/ML IJ SOLN: 6.2500 mg | INTRAMUSCULAR | Status: DC | PRN

## 2013-11-11 MED ORDER — KETOROLAC TROMETHAMINE 30 MG/ML IJ SOLN: 15.0000 mg | Freq: Once | INTRAMUSCULAR | Status: DC | PRN

## 2013-11-11 MED ORDER — SODIUM CHLORIDE 0.9 % IJ SOLN
INTRAMUSCULAR | Status: AC
  Filled 2013-11-11: qty 10

## 2013-11-11 MED ORDER — PROPOFOL 10 MG/ML IV BOLUS
INTRAVENOUS | Status: DC | PRN
  Administered 2013-11-11: 150 mg via INTRAVENOUS
  Administered 2013-11-11: 50 mg via INTRAVENOUS

## 2013-11-11 MED ORDER — GLYCOPYRROLATE 0.2 MG/ML IJ SOLN
INTRAMUSCULAR | Status: AC
  Filled 2013-11-11: qty 1

## 2013-11-11 MED ORDER — KETOROLAC TROMETHAMINE 30 MG/ML IJ SOLN
INTRAMUSCULAR | Status: AC
  Filled 2013-11-11: qty 1

## 2013-11-11 MED ORDER — DEXAMETHASONE SODIUM PHOSPHATE 10 MG/ML IJ SOLN
INTRAMUSCULAR | Status: DC | PRN
  Administered 2013-11-11: 10 mg via INTRAVENOUS

## 2013-11-11 MED ORDER — HEPARIN SODIUM (PORCINE) 5000 UNIT/ML IJ SOLN
INTRAMUSCULAR | Status: AC
  Filled 2013-11-11: qty 1

## 2013-11-11 MED ORDER — SCOPOLAMINE 1 MG/3DAYS TD PT72
MEDICATED_PATCH | TRANSDERMAL | Status: DC
Start: 2013-11-11 — End: 2013-11-14
  Administered 2013-11-11: 1.5 mg via TRANSDERMAL
  Filled 2013-11-11: qty 1

## 2013-11-11 MED ORDER — ROCURONIUM BROMIDE 100 MG/10ML IV SOLN
INTRAVENOUS | Status: DC | PRN
  Administered 2013-11-11: 10 mg via INTRAVENOUS
  Administered 2013-11-11: 40 mg via INTRAVENOUS

## 2013-11-11 MED ORDER — MIDAZOLAM HCL 2 MG/2ML IJ SOLN
INTRAMUSCULAR | Status: DC | PRN
  Administered 2013-11-11: 2 mg via INTRAVENOUS

## 2013-11-11 MED ORDER — LIDOCAINE HCL (CARDIAC) 20 MG/ML IV SOLN
INTRAVENOUS | Status: DC | PRN
  Administered 2013-11-11: 100 mg via INTRAVENOUS

## 2013-11-11 MED ORDER — LACTATED RINGERS IV SOLN
INTRAVENOUS | Status: DC
  Administered 2013-11-11 (×2): via INTRAVENOUS

## 2013-11-11 MED ORDER — ONDANSETRON HCL 4 MG/2ML IJ SOLN: 4.0000 mg | Freq: Once | INTRAMUSCULAR | Status: DC | PRN

## 2013-11-11 MED ORDER — OXYCODONE-ACETAMINOPHEN 5-325 MG PO TABS
1.0000 | ORAL_TABLET | Freq: Once | ORAL | Status: AC
  Administered 2013-11-11: 1 via ORAL

## 2013-11-11 MED ORDER — OXYCODONE-ACETAMINOPHEN 5-325 MG PO TABS
ORAL_TABLET | ORAL | Status: DC
Start: 2013-11-11 — End: 2013-11-11
  Filled 2013-11-11: qty 1

## 2013-11-11 MED ORDER — NEOSTIGMINE METHYLSULFATE 10 MG/10ML IV SOLN
INTRAVENOUS | Status: DC | PRN
  Administered 2013-11-11: 4 mg via INTRAVENOUS

## 2013-11-11 MED ORDER — LACTATED RINGERS IR SOLN
Status: DC | PRN
  Administered 2013-11-11: 900 mL

## 2013-11-11 MED ORDER — GLYCOPYRROLATE 0.2 MG/ML IJ SOLN
INTRAMUSCULAR | Status: AC
  Filled 2013-11-11: qty 3

## 2013-11-11 MED ORDER — PHENYLEPHRINE HCL 10 MG/ML IJ SOLN
INTRAMUSCULAR | Status: DC | PRN
  Administered 2013-11-11: 80 ug via INTRAVENOUS

## 2013-11-11 MED ORDER — EPHEDRINE SULFATE 50 MG/ML IJ SOLN
INTRAMUSCULAR | Status: DC | PRN
  Administered 2013-11-11 (×2): 10 mg via INTRAVENOUS

## 2013-11-11 MED ORDER — MIDAZOLAM HCL 2 MG/2ML IJ SOLN
INTRAMUSCULAR | Status: AC
  Filled 2013-11-11: qty 2

## 2013-11-11 MED ORDER — ONDANSETRON HCL 4 MG/2ML IJ SOLN
INTRAMUSCULAR | Status: AC
  Filled 2013-11-11: qty 2

## 2013-11-11 SURGICAL SUPPLY — 43 items
ADH SKN CLS APL DERMABOND .7 (GAUZE/BANDAGES/DRESSINGS) ×1
ADH SKN CLS LQ APL DERMABOND (GAUZE/BANDAGES/DRESSINGS) ×1
BAG SPEC RTRVL LRG 6X4 10 (ENDOMECHANICALS)
BLADE SURG 11 STRL SS (BLADE) ×2 IMPLANT
CABLE HIGH FREQUENCY MONO STRZ (ELECTRODE) ×1 IMPLANT
CATH ROBINSON RED A/P 16FR (CATHETERS) ×1 IMPLANT
CHLORAPREP W/TINT 26ML (MISCELLANEOUS) ×2 IMPLANT
CLOTH BEACON ORANGE TIMEOUT ST (SAFETY) ×2 IMPLANT
CONT SPECI 4OZ STER CLIK (MISCELLANEOUS) ×1 IMPLANT
DERMABOND ADHESIVE PROPEN (GAUZE/BANDAGES/DRESSINGS) ×1
DERMABOND ADVANCED (GAUZE/BANDAGES/DRESSINGS) ×1
DERMABOND ADVANCED .7 DNX12 (GAUZE/BANDAGES/DRESSINGS) ×1 IMPLANT
DERMABOND ADVANCED .7 DNX6 (GAUZE/BANDAGES/DRESSINGS) IMPLANT
DRSG COVADERM PLUS 2X2 (GAUZE/BANDAGES/DRESSINGS) ×3 IMPLANT
DRSG OPSITE POSTOP 3X4 (GAUZE/BANDAGES/DRESSINGS) ×1 IMPLANT
EVACUATOR SMOKE 8.L (FILTER) ×1 IMPLANT
FORCEPS CUTTING 33CM 5MM (CUTTING FORCEPS) ×1 IMPLANT
FORCEPS CUTTING 45CM 5MM (CUTTING FORCEPS) IMPLANT
GLOVE BIO SURGEON STRL SZ7 (GLOVE) ×2 IMPLANT
GLOVE BIOGEL PI IND STRL 7.0 (GLOVE) ×1 IMPLANT
GLOVE BIOGEL PI INDICATOR 7.0 (GLOVE) ×3
GOWN STRL REUS W/TWL LRG LVL3 (GOWN DISPOSABLE) ×6 IMPLANT
HEMOSTAT SURGICEL 2X14 (HEMOSTASIS) ×1 IMPLANT
IV LACTATED RINGER IRRG 3000ML (IV SOLUTION) ×2
IV LR IRRIG 3000ML ARTHROMATIC (IV SOLUTION) IMPLANT
MANIPULATOR UTERINE 4.5 ZUMI (MISCELLANEOUS) ×2 IMPLANT
NEEDLE INSUFFLATION 120MM (ENDOMECHANICALS) ×1 IMPLANT
NS IRRIG 1000ML POUR BTL (IV SOLUTION) ×2 IMPLANT
PACK LAPAROSCOPY BASIN (CUSTOM PROCEDURE TRAY) ×2 IMPLANT
POUCH SPECIMEN RETRIEVAL 10MM (ENDOMECHANICALS) IMPLANT
PROTECTOR NERVE ULNAR (MISCELLANEOUS) ×2 IMPLANT
SCISSORS LAP 5X35 DISP (ENDOMECHANICALS) IMPLANT
SET IRRIG TUBING LAPAROSCOPIC (IRRIGATION / IRRIGATOR) ×1 IMPLANT
SOLUTION ELECTROLUBE (MISCELLANEOUS) ×1 IMPLANT
SUT VICRYL 0 UR6 27IN ABS (SUTURE) ×2 IMPLANT
SUT VICRYL 4-0 PS2 18IN ABS (SUTURE) ×3 IMPLANT
SYR 30ML LL (SYRINGE) ×1 IMPLANT
TOWEL OR 17X24 6PK STRL BLUE (TOWEL DISPOSABLE) ×4 IMPLANT
TROCAR BALLN 12MMX100 BLUNT (TROCAR) ×1 IMPLANT
TROCAR XCEL NON-BLD 11X100MML (ENDOMECHANICALS) IMPLANT
TROCAR XCEL NON-BLD 5MMX100MML (ENDOMECHANICALS) ×2 IMPLANT
WARMER LAPAROSCOPE (MISCELLANEOUS) ×2 IMPLANT
WATER STERILE IRR 1000ML POUR (IV SOLUTION) ×2 IMPLANT

## 2013-11-11 NOTE — Anesthesia Procedure Notes (Signed)
Procedure Name: Intubation Date/Time: 11/11/2013 7:39 AM Performed by: Kensley Lares, Sheron Nightingale Pre-anesthesia Checklist: Patient identified, Timeout performed, Emergency Drugs available, Suction available and Patient being monitored Patient Re-evaluated:Patient Re-evaluated prior to inductionOxygen Delivery Method: Circle system utilized Preoxygenation: Pre-oxygenation with 100% oxygen Intubation Type: IV induction Ventilation: Mask ventilation without difficulty Laryngoscope Size: Mac and 3 Grade View: Grade II Tube size: 7.0 mm Number of attempts: 1 Placement Confirmation: ETT inserted through vocal cords under direct vision,  positive ETCO2 and breath sounds checked- equal and bilateral Secured at: 22 cm Dental Injury: Teeth and Oropharynx as per pre-operative assessment

## 2013-11-11 NOTE — Discharge Instructions (Addendum)
Ovarian Cyst An ovarian cyst is a sac filled with fluid or blood. This sac is attached to the ovary. Some cysts go away on their own. Other cysts need treatment.  HOME CARE   Only take medicine as told by your doctor.  Follow up with your doctor as told.  Get regular pelvic exams and Pap tests. GET HELP IF:  Your periods are late, not regular, or painful.  You stop having periods.  Your belly (abdominal) or pelvic pain does not go away.  Your belly becomes large or puffy (swollen).  You have a hard time peeing (totally emptying your bladder).  You have pressure on your bladder.  You have pain during sex.  You feel fullness, pressure, or discomfort in your belly.  You lose weight for no reason.  You feel sick most of the time.  You have a hard time pooping (constipation).  You do not feel like eating.  You develop pimples (acne).  You have an increase in hair on your body and face.  You are gaining weight for no reason.  You think you are pregnant. GET HELP RIGHT AWAY IF:   Your belly pain gets worse.  You feel sick to your stomach (nauseous), and you throw up (vomit).  You have a fever that comes on fast.  You have belly pain while pooping (bowel movement).  Your periods are heavier than usual. MAKE SURE YOU:   Understand these instructions.  Will watch your condition.  Will get help right away if you are not doing well or get worse. Document Released: 09/10/2007 Document Revised: 01/12/2013 Document Reviewed: 11/29/2012 [facility] Patient Information 2015 [place], [place]. This information is not intended to replace advice given to you by your health care provider. Make sure you discuss any questions you have with your health care provider.  Diagnostic Laparoscopy Laparoscopy is a surgical procedure. It is used to diagnose and treat diseases inside the belly (abdomen). It is usually a brief, common, and relatively simple procedure. The laparoscopeis a  thin, lighted, pencil-sized instrument. It is like a telescope. It is inserted into your abdomen through a small cut (incision). Your caregiver can look at the organs inside your body through this instrument. He or she can see if there is anything abnormal. Laparoscopy can be done either in a hospital or outpatient clinic. You may be given a mild sedative to help you relax before the procedure. Once in the operating room, you will be given a drug to make you sleep (general anesthesia). Laparoscopy usually lasts less than 1 hour. After the procedure, you will be monitored in a recovery area until you are stable and doing well. Once you are home, it will take 2 to 3 days to fully recover. RISKS AND COMPLICATIONS  Laparoscopy has relatively few risks. Your caregiver will discuss the risks with you before the procedure. Some problems that can occur include:  Infection.  Bleeding.  Damage to other organs.  Anesthetic side effects. PROCEDURE Once you receive anesthesia, your surgeon inflates the abdomen with a harmless gas (carbon dioxide). This makes the organs easier to see. The laparoscope is inserted into the abdomen through a small incision. This allows your surgeon to see into the abdomen. Other small instruments are also inserted into the abdomen through other small openings. Many surgeons attach a video camera to the laparoscope to enlarge the view. During a diagnostic laparoscopy, the surgeon may be looking for inflammation, infection, or cancer. Your surgeon may take tissue samples(biopsies). The  samples are sent to a specialist in looking at cells and tissue samples (pathologist). The pathologist examines them under a microscope. Biopsies can help to diagnose or confirm a disease. AFTER THE PROCEDURE   The gas is released from inside the abdomen.  The incisions are closed with stitches (sutures). Because these incisions are small (usually less than 1/2 inch), there is usually minimal  discomfort after the procedure. There may be some mild discomfort in the throat. This is from the tube placed in the throat while you were sleeping. You may have some mild abdominal discomfort. There may also be discomfort from the instrument placement incisions in the abdomen.  The recovery time is shortened as long as there are no complications.  You will rest in a recovery room until stable and doing well. As long as there are no complications, you may be allowed to go home. FINDING OUT THE RESULTS OF YOUR TEST Not all test results are available during your visit. If your test results are not back during the visit, make an appointment with your caregiver to find out the results. Do not assume everything is normal if you have not heard from your caregiver or the medical facility. It is important for you to follow up on all of your test results. HOME CARE INSTRUCTIONS   Take all medicines as directed.  Only take over-the-counter or prescription medicines for pain, discomfort, or fever as directed by your caregiver.  Resume daily activities as directed.  Showers are preferred over baths.  You may resume sexual activities in 1 week or as directed.  Do not drive while taking narcotics. SEEK MEDICAL CARE IF:   There is increasing abdominal pain.  There is new pain in the shoulders (shoulder strap areas).  You feel lightheaded or faint.  You have the chills.  You or your child has an oral temperature above 102 F (38.9 C).  There is pus-like (purulent) drainage from any of the wounds.  You are unable to pass gas or have a bowel movement.  You feel sick to your stomach (nauseous) or throw up (vomit). MAKE SURE YOU:   Understand these instructions.  Will watch your condition.  Will get help right away if you are not doing well or get worse. Document Released: 06/30/2000 Document Revised: 07/19/2012 Document Reviewed: 03/24/2007 [facility] Patient Information 2015 [place],  [place]. This information is not intended to replace advice given to you by your health care provider. Make sure you discuss any questions you have with your health care provider.

## 2013-11-11 NOTE — Op Note (Signed)
Preoperative diagnosis: Right ovarian cyst Postoperative diagnosis: Same Procedure: Laparoscopic right ovarian cyst drainage Surgeon: Dr Azucena Fallen, MD Assistants: none Anesthesia Gen. Endotracheal IV fluids LR 1700 cc EBL minimal 25 cc Urine clear 150 cc in foley Complications none Disposition PACU and home Specimens pelvic washings   Procedure Patient is 42 yo, G2P0020 with 7 cm  right ovarian cyst and right pelvic pain. Patient wants ovarian cyst drainage only since concerned about possible oophorectomy if ovarian cystectomy was attempted. She agreed that if it were an endometrioma I'll drain it and she will have post-operative menses suppression. If it was not endometrioma then cystectomy would be done. Risk and complications of surgery including infection, bleeding, damage to internal organs, other complications including pneumonia, VTE were reviewed and also reviewed risk of ovarian cyst recurrence especially if only drainage was performed. Patient voiced understanding. Informed written consent was obtained and patient was brought to the operating room with IV running. Timeout was carried out. She underwent general anesthesia without difficulty and was given dorsal lithotomy position with left arm tucked. Examination under anesthesia revealed retroverted normal uterus and right adnexal mass. She was prepped and draped in standard fashion. Bladder was emptied with foley. Speculum was placed anterior lip of cervix was grasped with tenaculum, uterus was sounded to 8 cm and a Zumi manipulator was introduced in the uterine cavity and secured with balloon.  Gowned, gloved again and attention was focused on the abdomen. A 10 mm transverse incision was made at the umbilicus after injecting Marcaine. Incision was carried down to the fascia was incised peritoneal entry was confirmed. Stay suture of 0 Vicryl was taken on the fascia and Hassan cannula was introduced. Insufflation was begun with CO2.  Patient was given Trendelenburg position. A 0 laparoscope was introduced. Internal organs appear normal including normal bowel appendix normal uterus, tubes and left ovary. Right ovary was enlarged with ovarian cyst, surface was clear, small adhesions noted in cul de sac but otherwise no evidence of endometriosis. Two 5 mm ports were placed on the left lower quadrant after injecting marcaine, trocars placed under vision. Pelvic washings obtained. Linear incision was made with endosheers on the ovary where cyst was palpated and it drained copious dark endometriotic fluid. Cyst incision was extended and suction irrigator was introduced in the cyst cavity, flushed with saline and irrigated. Cut edges were cauterized, hemostasis was noted. Cystectomy was not performed as per patient's choice. Surgicel was placed on the ovary for further hemostasis.  All instruments were removed, pneumoperitoneum was released. Stay suture at the fascia were tied off with adequate closure. The skin was approximated using 4-0 Vicryl in subcuticular fashion. Dermabond was applied at the incision. The Bradshaw manipulator was removed from the uterus.   All counts were correct x2. Patient was brought out to the recovery room in stable condition. Surgical findings were discussed with patient's family. Followup with Dr. Benjie Karvonen in office in 2 weeks.

## 2013-11-11 NOTE — Anesthesia Postprocedure Evaluation (Signed)
Anesthesia Post Note  Patient: Catherine Olsen  Procedure(s) Performed: Procedure(s) (LRB): LAPAROSCOPY OPERATIVE with  Drainage of  RIGHT Ovarian ENDOMETRIOMA (Right)  Anesthesia type: General  Patient location: PACU  Post pain: Pain level controlled  Post assessment: Post-op Vital signs reviewed  Last Vitals:  Filed Vitals:   11/11/13 0930  BP: 123/98  Pulse: 69  Temp:   Resp: 16    Post vital signs: Reviewed  Level of consciousness: sedated  Complications: No apparent anesthesia complications

## 2013-11-11 NOTE — H&P (Signed)
Catherine Olsen is an 43 y.o. female with right ovarian cyst and pain is here for surgery. Patient noted right sided pelvic pain earlier this year, was seen by her gynecologist in another practice and pelvic sonogram performed noted right ovaria cyst 6-7 cm in size, suspect endometriosis. Patient transferred care since she apparently was not offered ovarian conservative option and was advised oophorectomy. She desire ovarian conservation at any cost and wants just cyst aspiration if any extensive surgery may cause bleeding and risk losing ovary. Patient was rescheduled several times and seen in the office several times for repeated counseling and is here today for surgery. She  Understands definite risk of recurrence and possible lack of tissue diagnosis if only aspiration was performed. She agrees to proceed with ovarian cystectomy.   Currently heavy smoker, Asthma, saw PCP for pulmonary clearance. Bipolar/ disabled. Using illicit drugs Crack cocaine.  Normal recent Paps. Single at present. No breast complaints. No family Hx of GU/GI cancers.   Patient's last menstrual period was 09/23/2013.    Past Medical History  Diagnosis Date  . Asthma   . Anxiety   . Mental disorder   . Bipolar 1 disorder   . Endometriosis   . PONV (postoperative nausea and vomiting)   . Termination of pregnancy     x 2 at age 63 and 43 yrs old  . Depression   . Headache(784.0)     otc med prn  . Arthritis     hands, lower back, knees  . Fibromyalgia   . Scoliosis     Past Surgical History  Procedure Laterality Date  . Nose surgery      rhinoplasty at age 33 yrs  . Wisdom tooth extraction    . Facial cosmetic surgery      right cheek    Family History  Problem Relation Age of Onset  . Diabetes Mother   . Hypertension Mother     Social History:  reports that she has been smoking Cigarettes and E-cigarettes.  She has a 14.5 pack-year smoking history. She uses smokeless tobacco. She reports that she drinks  alcohol. She reports that she uses illicit drugs ("Crack" cocaine).  Allergies:  Allergies  Allergen Reactions  . Sulfa Antibiotics Hives  . Other Anxiety    steroids    Prescriptions prior to admission  Medication Sig Dispense Refill  . ARIPiprazole (ABILIFY) 5 MG tablet Take 2.5 mg by mouth at bedtime.       Marland Kitchen albuterol (PROVENTIL HFA;VENTOLIN HFA) 108 (90 BASE) MCG/ACT inhaler Inhale 1-2 puffs into the lungs every 6 (six) hours as needed for wheezing or shortness of breath.  1 Inhaler  2  . clonazePAM (KLONOPIN) 2 MG tablet Take 2-4 mg by mouth at bedtime as needed (sleep).      . COPPER PO Take 1 capsule by mouth 2 (two) times a week.      . diphenhydrAMINE (BENADRYL) 25 MG tablet Take 50-100 mg by mouth at bedtime as needed for sleep.       . Magnesium 250 MG TABS Take 250 mg by mouth 2 (two) times a week.       . Multiple Vitamin (MULTIVITAMIN WITH MINERALS) TABS tablet Take 1 tablet by mouth daily.      Marland Kitchen selenium 50 MCG TABS tablet Take 100 mcg by mouth 2 (two) times a week.       . vitamin C (ASCORBIC ACID) 250 MG tablet Take 250 mg by mouth 2 (two) times  a week.      . zinc sulfate 220 MG capsule Take 220 mg by mouth 2 (two) times a week.       ROS no cough/ SOB. Has anxiety.   Blood pressure 110/73, pulse 62, temperature 97.3 F (36.3 C), resp. rate 20, last menstrual period 09/23/2013, SpO2 99.00%. Physical Exam Physical exam:  A&O x 3, no acute distress. Pleasant HEENT neg, no thyromegaly Lungs CTA bilat CV RRR, A1S2 normal Abdo soft, non tender, non acute Extr no edema/ tenderness Pelvic Right adnexal cyst palpated. Relatively immobile.   Results for orders placed during the hospital encounter of 11/10/13 (from the past 24 hour(s))  CBC     Status: Abnormal   Collection Time    11/10/13  1:40 PM      Result Value Ref Range   WBC 9.4  4.0 - 10.5 K/uL   RBC 4.65  3.87 - 5.11 MIL/uL   Hemoglobin 15.2 (*) 12.0 - 15.0 g/dL   HCT 42.7  36.0 - 46.0 %   MCV 91.8   78.0 - 100.0 fL   MCH 32.7  26.0 - 34.0 pg   MCHC 35.6  30.0 - 36.0 g/dL   RDW 13.9  11.5 - 15.5 %   Platelets 203  150 - 400 K/uL   Assessment/Plan: 43 yo female with right adnexal /ovarian cyst, 7 cm, echogenic without solid nodules and abnormal flow, suspected endometrioma or cystadenoma. Plan pelvic wash, ovarian cystectomy and if needed ovarian cyst aspiration.  Risks/complications of surgery reviewed incl infection, bleeding, damage to internal organs including bladder, bowels, ureters, blood vessels, other risks from anesthesia, VTE and delayed complications of any surgery, complications in future surgery reviewed. Risk of recurrence if just aspiration done and partial cystectomy done and non resolution of pelvic pain if other causes i.e pelvic endometriosis not limited to ovarian cyst reviewed. She does not want any extensive surgery.   Charese Abundis R 11/11/2013, 6:11 AM

## 2013-11-11 NOTE — Transfer of Care (Signed)
Immediate Anesthesia Transfer of Care Note  Patient: Catherine Olsen  Procedure(s) Performed: Procedure(s): LAPAROSCOPY OPERATIVE with  Drainage of  RIGHT Ovarian ENDOMETRIOMA (Right)  Patient Location: PACU  Anesthesia Type:General  Level of Consciousness: awake, alert  and oriented  Airway & Oxygen Therapy: Patient Spontanous Breathing and Patient connected to nasal cannula oxygen  Post-op Assessment: Report given to PACU RN and Post -op Vital signs reviewed and stable  Post vital signs: Reviewed and stable  Complications: No apparent anesthesia complications

## 2013-11-12 ENCOUNTER — Emergency Department (HOSPITAL_COMMUNITY)
Admission: EM | Admit: 2013-11-12 | Discharge: 2013-11-13 | Discharge status: Against Medical Advice | Payer: Medicare Other | Attending: Emergency Medicine | Admitting: Emergency Medicine

## 2013-11-12 ENCOUNTER — Encounter (HOSPITAL_COMMUNITY): Payer: Self-pay | Admitting: Emergency Medicine

## 2013-11-12 DIAGNOSIS — F17200 Nicotine dependence, unspecified, uncomplicated: Secondary | ICD-10-CM | POA: Insufficient documentation

## 2013-11-12 DIAGNOSIS — H539 Unspecified visual disturbance: Secondary | ICD-10-CM | POA: Insufficient documentation

## 2013-11-12 NOTE — ED Notes (Signed)
[facility].   Since her surgery she has had bi-lateral blurred vision and it appears to b e getting worse.  She usually has very good vision and she does not wear glasses

## 2013-11-14 ENCOUNTER — Inpatient Hospital Stay (HOSPITAL_COMMUNITY)
Admission: AD | Admit: 2013-11-14 | Discharge: 2013-11-14 | Disposition: A | Discharge status: Home / Self Care | Payer: Medicare Other | Source: Ambulatory Visit | Attending: Obstetrics and Gynecology | Admitting: Obstetrics and Gynecology

## 2013-11-14 ENCOUNTER — Encounter (HOSPITAL_COMMUNITY): Payer: Self-pay | Admitting: Obstetrics & Gynecology

## 2013-11-14 DIAGNOSIS — N898 Other specified noninflammatory disorders of vagina: Secondary | ICD-10-CM | POA: Diagnosis not present

## 2013-11-14 DIAGNOSIS — F329 Major depressive disorder, single episode, unspecified: Secondary | ICD-10-CM | POA: Insufficient documentation

## 2013-11-14 DIAGNOSIS — F17200 Nicotine dependence, unspecified, uncomplicated: Secondary | ICD-10-CM | POA: Diagnosis not present

## 2013-11-14 DIAGNOSIS — R109 Unspecified abdominal pain: Secondary | ICD-10-CM

## 2013-11-14 DIAGNOSIS — R1084 Generalized abdominal pain: Secondary | ICD-10-CM | POA: Insufficient documentation

## 2013-11-14 DIAGNOSIS — R1032 Left lower quadrant pain: Secondary | ICD-10-CM | POA: Diagnosis not present

## 2013-11-14 DIAGNOSIS — N939 Abnormal uterine and vaginal bleeding, unspecified: Secondary | ICD-10-CM

## 2013-11-14 DIAGNOSIS — F3289 Other specified depressive episodes: Secondary | ICD-10-CM | POA: Insufficient documentation

## 2013-11-14 LAB — URINE MICROSCOPIC-ADD ON

## 2013-11-14 LAB — COMPREHENSIVE METABOLIC PANEL
ALK PHOS: 62 U/L (ref 39–117)
ALT: 11 U/L (ref 0–35)
AST: 11 U/L (ref 0–37)
Albumin: 3.7 g/dL (ref 3.5–5.2)
Anion gap: 11 (ref 5–15)
BUN: 18 mg/dL (ref 6–23)
CO2: 27 meq/L (ref 19–32)
Calcium: 9.3 mg/dL (ref 8.4–10.5)
Chloride: 104 mEq/L (ref 96–112)
Creatinine, Ser: 0.68 mg/dL (ref 0.50–1.10)
GLUCOSE: 78 mg/dL (ref 70–99)
POTASSIUM: 3.9 meq/L (ref 3.7–5.3)
Sodium: 142 mEq/L (ref 137–147)
Total Protein: 6.9 g/dL (ref 6.0–8.3)

## 2013-11-14 LAB — URINALYSIS, ROUTINE W REFLEX MICROSCOPIC
BILIRUBIN URINE: NEGATIVE
GLUCOSE, UA: NEGATIVE mg/dL
Ketones, ur: 15 mg/dL — AB
NITRITE: POSITIVE — AB
PH: 8 (ref 5.0–8.0)
Protein, ur: >300 mg/dL — AB
SPECIFIC GRAVITY, URINE: 1.015 (ref 1.005–1.030)
Urobilinogen, UA: 1 mg/dL (ref 0.0–1.0)

## 2013-11-14 LAB — POCT PREGNANCY, URINE: Preg Test, Ur: NEGATIVE

## 2013-11-14 LAB — CBC
HEMATOCRIT: 43 % (ref 36.0–46.0)
HEMOGLOBIN: 14.9 g/dL (ref 12.0–15.0)
MCH: 32.3 pg (ref 26.0–34.0)
MCHC: 34.7 g/dL (ref 30.0–36.0)
MCV: 93.1 fL (ref 78.0–100.0)
Platelets: 215 10*3/uL (ref 150–400)
RBC: 4.62 MIL/uL (ref 3.87–5.11)
RDW: 14.1 % (ref 11.5–15.5)
WBC: 7.9 10*3/uL (ref 4.0–10.5)

## 2013-11-14 NOTE — Discharge Instructions (Signed)
Abdominal Pain Many things can cause belly (abdominal) pain. Most times, the belly pain is not dangerous. Many cases of belly pain can be watched and treated at home. HOME CARE   Do not take medicines that help you go poop (laxatives) unless told to by your doctor.  Only take medicine as told by your doctor.  Eat or drink as told by your doctor. Your doctor will tell you if you should be on a special diet. GET HELP IF:  You do not know what is causing your belly pain.  You have belly pain while you are sick to your stomach (nauseous) or have runny poop (diarrhea).  You have pain while you pee or poop.  Your belly pain wakes you up at night.  You have belly pain that gets worse or better when you eat.  You have belly pain that gets worse when you eat fatty foods.  You have a fever. GET HELP RIGHT AWAY IF:   The pain does not go away within 2 hours.  You keep throwing up (vomiting).  The pain changes and is only in the right or left part of the belly.  You have bloody or tarry looking poop. MAKE SURE YOU:   Understand these instructions.  Will watch your condition.  Will get help right away if you are not doing well or get worse. Document Released: 09/10/2007 Document Revised: 03/29/2013 Document Reviewed: 12/01/2012 [facility] Patient Information 2015 [place], [place]. This information is not intended to replace advice given to you by your health care provider. Make sure you discuss any questions you have with your health care provider.  Abdominal Pain During Pregnancy Belly (abdominal) pain is common during pregnancy. Most of the time, it is not a serious problem. Other times, it can be a sign that something is wrong with the pregnancy. Always tell your doctor if you have belly pain. HOME CARE Monitor your belly pain for any changes. The following actions may help you feel better:  Do not have sex (intercourse) or put anything in your vagina until you feel better.  Rest  until your pain stops.  Drink clear fluids if you feel sick to your stomach (nauseous). Do not eat solid food until you feel better.  Only take medicine as told by your doctor.  Keep all doctor visits as told. GET HELP RIGHT AWAY IF:   You are bleeding, leaking fluid, or pieces of tissue come out of your vagina.  You have more pain or cramping.  You keep throwing up (vomiting).  You have pain when you pee (urinate) or have blood in your pee.  You have a fever.  You do not feel your baby moving as much.  You feel very weak or feel like passing out.  You have trouble breathing, with or without belly pain.  You have a very bad headache and belly pain.  You have fluid leaking from your vagina and belly pain.  You keep having watery poop (diarrhea).  Your belly pain does not go away after resting, or the pain gets worse. MAKE SURE YOU:   Understand these instructions.  Will watch your condition.  Will get help right away if you are not doing well or get worse. Document Released: 03/12/2009 Document Revised: 11/24/2012 Document Reviewed: 10/21/2012 [facility] Patient Information 2015 [place], [place]. This information is not intended to replace advice given to you by your health care provider. Make sure you discuss any questions you have with your health care provider.  Pelvic Pain Female pelvic pain can be caused by many different things and start from a variety of places. Pelvic pain refers to pain that is located in the lower half of the abdomen and between your hips. The pain may occur over a short period of time (acute) or may be reoccurring (chronic). The cause of pelvic pain may be related to disorders affecting the female reproductive organs (gynecologic), but it may also be related to the bladder, kidney stones, an intestinal complication, or muscle or skeletal problems. Getting help right away for pelvic pain is important, especially if there has been severe, sharp, or a  sudden onset of unusual pain. It is also important to get help right away because some types of pelvic pain can be life threatening.  CAUSES  Below are only some of the causes of pelvic pain. The causes of pelvic pain can be in one of several categories.   Gynecologic.  Pelvic inflammatory disease.  Sexually transmitted infection.  Ovarian cyst or a twisted ovarian ligament (ovarian torsion).  Uterine lining that grows outside the uterus (endometriosis).  Fibroids, cysts, or tumors.  Ovulation.  Pregnancy.  Pregnancy that occurs outside the uterus (ectopic pregnancy).  Miscarriage.  Labor.  Abruption of the placenta or ruptured uterus.  Infection.  Uterine infection (endometritis).  Bladder infection.  Diverticulitis.  Miscarriage related to a uterine infection (septic abortion).  Bladder.  Inflammation of the bladder (cystitis).  Kidney stone(s).  Gastrointestinal.  Constipation.  Diverticulitis.  Neurologic.  Trauma.  Feeling pelvic pain because of mental or emotional causes (psychosomatic).  Cancers of the bowel or pelvis. EVALUATION  Your caregiver will want to take a careful history of your concerns. This includes recent changes in your health, a careful gynecologic history of your periods (menses), and a sexual history. Obtaining your family history and medical history is also important. Your caregiver may suggest a pelvic exam. A pelvic exam will help identify the location and severity of the pain. It also helps in the evaluation of which organ system may be involved. In order to identify the cause of the pelvic pain and be properly treated, your caregiver may order tests. These tests may include:   A pregnancy test.  Pelvic ultrasonography.  An X-ray exam of the abdomen.  A urinalysis or evaluation of vaginal discharge.  Blood tests. HOME CARE INSTRUCTIONS   Only take over-the-counter or prescription medicines for pain, discomfort, or  fever as directed by your caregiver.   Rest as directed by your caregiver.   Eat a balanced diet.   Drink enough fluids to make your urine clear or pale yellow, or as directed.   Avoid sexual intercourse if it causes pain.   Apply warm or cold compresses to the lower abdomen depending on which one helps the pain.   Avoid stressful situations.   Keep a journal of your pelvic pain. Write down when it started, where the pain is located, and if there are things that seem to be associated with the pain, such as food or your menstrual cycle.  Follow up with your caregiver as directed.  SEEK MEDICAL CARE IF:  Your medicine does not help your pain.  You have abnormal vaginal discharge. SEEK IMMEDIATE MEDICAL CARE IF:   You have heavy bleeding from the vagina.   Your pelvic pain increases.   You feel light-headed or faint.   You have chills.   You have pain with urination or blood in your urine.   You have uncontrolled  diarrhea or vomiting.   You have a fever or persistent symptoms for more than 3 days.  You have a fever and your symptoms suddenly get worse.   You are being physically or sexually abused.  MAKE SURE YOU:  Understand these instructions.  Will watch your condition.  Will get help if you are not doing well or get worse. Document Released: 02/19/2004 Document Revised: 08/08/2013 Document Reviewed: 07/14/2011 [facility] Patient Information 2015 [place], [place]. This information is not intended to replace advice given to you by your health care provider. Make sure you discuss any questions you have with your health care provider.  Abnormal Uterine Bleeding Abnormal uterine bleeding can affect women at various stages in life, including teenagers, women in their reproductive years, pregnant women, and women who have reached menopause. Several kinds of uterine bleeding are considered abnormal, including:  Bleeding or spotting between periods.    Bleeding after sexual intercourse.   Bleeding that is heavier or more than normal.   Periods that last longer than usual.  Bleeding after menopause.  Many cases of abnormal uterine bleeding are minor and simple to treat, while others are more serious. Any type of abnormal bleeding should be evaluated by your health care provider. Treatment will depend on the cause of the bleeding. HOME CARE INSTRUCTIONS Monitor your condition for any changes. The following actions may help to alleviate any discomfort you are experiencing:  Avoid the use of tampons and douches as directed by your health care provider.  Change your pads frequently. You should get regular pelvic exams and Pap tests. Keep all follow-up appointments for diagnostic tests as directed by your health care provider.  SEEK MEDICAL CARE IF:   Your bleeding lasts more than 1 week.   You feel dizzy at times.  SEEK IMMEDIATE MEDICAL CARE IF:   You pass out.   You are changing pads every 15 to 30 minutes.   You have abdominal pain.  You have a fever.   You become sweaty or weak.   You are passing large blood clots from the vagina.   You start to feel nauseous and vomit. MAKE SURE YOU:   Understand these instructions.  Will watch your condition.  Will get help right away if you are not doing well or get worse. Document Released: 03/24/2005 Document Revised: 03/29/2013 Document Reviewed: 10/21/2012 [facility] Patient Information 2015 [place], [place]. This information is not intended to replace advice given to you by your health care provider. Make sure you discuss any questions you have with your health care provider.

## 2013-11-14 NOTE — MAU Note (Signed)
Generalized abdominal pain that has worsened since surgery on Friday. Vaginal bleeding has increased since surgery; bright red, no clots. Denies fever.

## 2013-11-14 NOTE — Progress Notes (Signed)
Pt states that she cannot and will not have a speculum exam or anyone to press on her abdomen.
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
## 2013-11-14 NOTE — Progress Notes (Signed)
Patient refusing pelvic exam and TVUS d/t "being in so much pain and not able to tolerate that."  Drs. [place] notified / recommend to assess bowel sounds and d/c home, if normal VS and BS.  Laury Deep, M MSN, CNM 11/14/2013, 10:44 PM

## 2013-11-16 NOTE — MAU Provider Note (Signed)
Spoke with CNM on call who called me since I was the Psychologist, sport and exercise. Reviewed VS, exam and there does not seem to be surgical abdomen and hence not an indication for CT san that the patient wants. Post-op pain but expected. No more narcotics will be given since got a Rx from office before surgery as well.  V.Alima Naser, MD

## 2013-11-18 ENCOUNTER — Ambulatory Visit (INDEPENDENT_AMBULATORY_CARE_PROVIDER_SITE_OTHER): Payer: Medicare Other | Admitting: General Surgery

## 2013-11-25 DIAGNOSIS — N949 Unspecified condition associated with female genital organs and menstrual cycle: Secondary | ICD-10-CM | POA: Diagnosis not present

## 2013-12-22 DIAGNOSIS — F4310 Post-traumatic stress disorder, unspecified: Secondary | ICD-10-CM | POA: Diagnosis not present

## 2013-12-22 DIAGNOSIS — F4323 Adjustment disorder with mixed anxiety and depressed mood: Secondary | ICD-10-CM | POA: Diagnosis not present

## 2014-01-06 DIAGNOSIS — F3132 Bipolar disorder, current episode depressed, moderate: Secondary | ICD-10-CM | POA: Diagnosis not present

## 2014-01-06 DIAGNOSIS — F4312 Post-traumatic stress disorder, chronic: Secondary | ICD-10-CM | POA: Diagnosis not present

## 2014-01-08 ENCOUNTER — Encounter (HOSPITAL_COMMUNITY): Payer: Self-pay | Admitting: *Deleted

## 2014-01-08 ENCOUNTER — Inpatient Hospital Stay (HOSPITAL_COMMUNITY): Payer: Medicare Other

## 2014-01-08 ENCOUNTER — Inpatient Hospital Stay (HOSPITAL_COMMUNITY)
Admission: AD | Admit: 2014-01-08 | Discharge: 2014-01-08 | Disposition: A | Discharge status: Home / Self Care | Payer: Medicare Other | Source: Ambulatory Visit | Attending: Obstetrics and Gynecology | Admitting: Obstetrics and Gynecology

## 2014-01-08 DIAGNOSIS — N801 Endometriosis of ovary: Secondary | ICD-10-CM | POA: Diagnosis not present

## 2014-01-08 DIAGNOSIS — N8320 Unspecified ovarian cysts: Secondary | ICD-10-CM | POA: Diagnosis not present

## 2014-01-08 DIAGNOSIS — F17210 Nicotine dependence, cigarettes, uncomplicated: Secondary | ICD-10-CM | POA: Insufficient documentation

## 2014-01-08 DIAGNOSIS — N939 Abnormal uterine and vaginal bleeding, unspecified: Secondary | ICD-10-CM | POA: Diagnosis not present

## 2014-01-08 DIAGNOSIS — D251 Intramural leiomyoma of uterus: Secondary | ICD-10-CM | POA: Diagnosis not present

## 2014-01-08 DIAGNOSIS — N80129 Deep endometriosis of ovary, unspecified ovary: Secondary | ICD-10-CM

## 2014-01-08 LAB — CBC
HCT: 41.9 % (ref 36.0–46.0)
HEMOGLOBIN: 14.5 g/dL (ref 12.0–15.0)
MCH: 32.6 pg (ref 26.0–34.0)
MCHC: 34.6 g/dL (ref 30.0–36.0)
MCV: 94.2 fL (ref 78.0–100.0)
PLATELETS: 203 10*3/uL (ref 150–400)
RBC: 4.45 MIL/uL (ref 3.87–5.11)
RDW: 14 % (ref 11.5–15.5)
WBC: 9 10*3/uL (ref 4.0–10.5)

## 2014-01-08 LAB — WET PREP, GENITAL
Trich, Wet Prep: NONE SEEN
YEAST WET PREP: NONE SEEN

## 2014-01-08 NOTE — MAU Note (Signed)
Vaginal bleeding x 3 weeks. Had ovarian cyst drained 11/11/13. Intermittent abdominal pain since then; denies abdominal pain today. Some clots; bleeding stopping & starting x 3 weeks. No birth control.

## 2014-01-08 NOTE — Discharge Instructions (Signed)
Endometriosis Endometriosis is a condition in which the tissue that lines the uterus (endometrium) grows outside of its normal location. The tissue may grow in many locations close to the uterus, but it commonly grows on the ovaries, fallopian tubes, vagina, or bowel. Because the uterus expels, or sheds, its lining every menstrual cycle, there is bleeding wherever the endometrial tissue is located. This can cause pain because blood is irritating to tissues not normally exposed to it.  CAUSES  The cause of endometriosis is not known.  SIGNS AND SYMPTOMS  Often, there are no symptoms. When symptoms are present, they can vary with the location of the displaced tissue. Various symptoms can occur at different times. Although symptoms occur mainly during a woman's menstrual period, they can also occur midcycle and usually stop with menopause. Some people may go months with no symptoms at all. Symptoms may include:   Back or abdominal pain.   Heavier bleeding during periods.   Pain during intercourse.   Painful bowel movements.   Infertility. DIAGNOSIS  Your health care provider will do a physical exam and ask about your symptoms. Various tests may be done, such as:   Blood tests and urine tests. These are done to help rule out other problems.   Ultrasound. This test is done to look for abnormal tissue.   An X-ray of the lower bowel (barium enema).  Laparoscopy. In this procedure, a thin, lighted tube with a tiny camera on the end (laparoscope) is inserted into your abdomen. This helps your health care provider look for abnormal tissue to confirm the diagnosis. The health care provider may also remove a small piece of tissue (biopsy) from any abnormal tissue found. This tissue sample can then be sent to a lab so it can be looked at under a microscope. TREATMENT  Treatment will vary and may include:   Medicines to relieve pain. Nonsteroidal anti-inflammatory drugs (NSAIDs) are a type of  pain medicine that can help to relieve the pain caused by endometriosis.  Hormonal therapy. When using hormonal therapy, periods are eliminated. This eliminates the monthly exposure to blood by the displaced endometrial tissue.   Surgery. Surgery may sometimes be done to remove the abnormal endometrial tissue. In severe cases, surgery may be done to remove the fallopian tubes, uterus, and ovaries (hysterectomy). HOME CARE INSTRUCTIONS   Take all medicines as directed by your health care provider. Do not take aspirin because it may increase bleeding when you are not on hormonal therapy.   Avoid activities that produce pain, including sexual activity. SEEK MEDICAL CARE IF:  You have pelvic pain before, after, or during your periods.  You have pelvic pain between periods that gets worse during your period.  You have pelvic pain during or after sex.  You have pelvic pain with bowel movements or urination, especially during your period.  You have problems getting pregnant.  You have a fever. SEEK IMMEDIATE MEDICAL CARE IF:   Your pain is severe and is not responding to pain medicine.   You have severe nausea and vomiting, or you cannot keep foods down.   You have pain that is limited to the right lower part of your abdomen.   You have swelling or increasing pain in your abdomen.   You see blood in your stool.  MAKE SURE YOU:   Understand these instructions.  Will watch your condition.  Will get help right away if you are not doing well or get worse. Document Released: 03/21/2000 Document   Revised: 08/08/2013 Document Reviewed: 11/19/2012 Exit[employer] Patient Information 2015 [employer]. This information is not intended to replace advice given to you by your health care provider. Make sure you discuss any questions you have with your health care provider.  

## 2014-01-08 NOTE — MAU Provider Note (Signed)
History     CSN: [id number]  Arrival date and time: 01/08/14 4665   First Provider Initiated Contact with Patient 01/08/14 2102      Chief Complaint  Patient presents with  . Vaginal Bleeding   HPI  Catherine Olsen is a 43 y.o. [id number] who presents today with vaginal bleeding. She had an ovarian cystectomy on 11/11/13. She states that about 3 weeks ago she had some light spotting, and then she continued to have bleeding that alternated between heavy and light/brown. She has also had some intermittent abdominal pain. She denies any history of abnormal pap smears. She was offered and advised for menses suppression, and she does not want that at this time. She states that she was told that there was no endometriosis anywhere other than the ovary.   Past Medical History  Diagnosis Date  . Asthma   . Anxiety   . Mental disorder   . Bipolar 1 disorder   . Endometriosis   . PONV (postoperative nausea and vomiting)   . Termination of pregnancy     x 2 at age 76 and 43 yrs old  . Depression   . Headache(784.0)     otc med prn  . Arthritis     hands, lower back, knees  . Fibromyalgia   . Scoliosis   . Scoliosis     Past Surgical History  Procedure Laterality Date  . Nose surgery      rhinoplasty at age 58 yrs  . Wisdom tooth extraction    . Facial cosmetic surgery      right cheek  . Laparoscopy Right 11/11/2013    Procedure: LAPAROSCOPY OPERATIVE with  Drainage of  RIGHT Ovarian ENDOMETRIOMA;  Surgeon: Elveria Royals, MD;  Location: [facility];  Service: Gynecology;  Laterality: Right;    Family History  Problem Relation Age of Onset  . Diabetes Mother   . Hypertension Mother     History  Substance Use Topics  . Smoking status: Current Every Day Smoker -- 0.50 packs/day for 29 years    Types: Cigarettes, E-cigarettes  . Smokeless tobacco: Never Used  . Alcohol Use: Yes     Comment: "rare"    Allergies:  Allergies  Allergen Reactions  . Sulfa Antibiotics Hives  .  Other Anxiety    steroids    Prescriptions prior to admission  Medication Sig Dispense Refill  . albuterol (PROVENTIL HFA;VENTOLIN HFA) 108 (90 BASE) MCG/ACT inhaler Inhale 1-2 puffs into the lungs every 6 (six) hours as needed for wheezing or shortness of breath.  1 Inhaler  2  . ARIPiprazole (ABILIFY) 5 MG tablet Take 2.5 mg by mouth at bedtime.       . clonazePAM (KLONOPIN) 2 MG tablet Take 2-4 mg by mouth at bedtime as needed (sleep).      . COPPER PO Take 1 capsule by mouth 2 (two) times a week.      . diphenhydrAMINE (BENADRYL) 25 MG tablet Take 50-100 mg by mouth at bedtime as needed for sleep.       Marland Kitchen ibuprofen (ADVIL) 200 MG tablet Take 3 tablets (600 mg total) by mouth every 6 (six) hours as needed.  30 tablet  0  . Magnesium 250 MG TABS Take 250 mg by mouth 2 (two) times a week.       . Multiple Vitamin (MULTIVITAMIN WITH MINERALS) TABS tablet Take 1 tablet by mouth daily.      Marland Kitchen oxyCODONE-acetaminophen (ROXICET) 5-325 MG per tablet  Take 1 tablet by mouth every 6 (six) hours as needed for severe pain.  20 tablet  0  . selenium 50 MCG TABS tablet Take 100 mcg by mouth 2 (two) times a week.       . vitamin C (ASCORBIC ACID) 250 MG tablet Take 250 mg by mouth 2 (two) times a week.      . zinc sulfate 220 MG capsule Take 220 mg by mouth 2 (two) times a week.        ROS Physical Exam   Blood pressure 110/73, pulse 81, temperature 98.5 F (36.9 C), temperature source Oral, resp. rate 18, last menstrual period 12/18/2013, SpO2 100.00%.  Physical Exam  Nursing note and vitals reviewed. Constitutional: She is oriented to person, place, and time. She appears well-developed and well-nourished. No distress.  Cardiovascular: Normal rate.   Respiratory: Effort normal.  GI: Soft. There is no tenderness. There is no rebound.  Genitourinary:   External: no lesion Vagina: small amount of blood seen  Cervix: pink, smooth, no CMT Uterus: NSSC Adnexa: NT   Neurological: She is alert and  oriented to person, place, and time.  Skin: Skin is warm and dry.  Psychiatric: She has a normal mood and affect.    MAU Course  Procedures  Results for orders placed during the hospital encounter of 01/08/14 (from the past 24 hour(s))  CBC     Status: None   Collection Time    01/08/14  8:55 PM      Result Value Ref Range   WBC 9.0  4.0 - 10.5 K/uL   RBC 4.45  3.87 - 5.11 MIL/uL   Hemoglobin 14.5  12.0 - 15.0 g/dL   HCT 41.9  36.0 - 46.0 %   MCV 94.2  78.0 - 100.0 fL   MCH 32.6  26.0 - 34.0 pg   MCHC 34.6  30.0 - 36.0 g/dL   RDW 14.0  11.5 - 15.5 %   Platelets 203  150 - 400 K/uL  WET PREP, GENITAL     Status: Abnormal   Collection Time    01/08/14  9:15 PM      Result Value Ref Range   Yeast Wet Prep HPF POC NONE SEEN  NONE SEEN   Trich, Wet Prep NONE SEEN  NONE SEEN   Clue Cells Wet Prep HPF POC FEW (*) NONE SEEN   WBC, Wet Prep HPF POC FEW (*) NONE SEEN   US Transvaginal Non-ob  01/08/2014   CLINICAL DATA:  Vaginal bleeding since December 18, 2013, with intermittent pelvic pain since laparoscopic right ovarian cyst drainage on November 11, 2013.  EXAM: TRANSABDOMINAL AND TRANSVAGINAL ULTRASOUND OF PELVIS  TECHNIQUE: Both transabdominal and transvaginal ultrasound examinations of the pelvis were performed. Transabdominal technique was performed for global imaging of the pelvis including uterus, ovaries, adnexal regions, and pelvic cul-de-sac. It was necessary to proceed with endovaginal exam following the transabdominal exam to visualize the uterus and ovaries in greater detail.  COMPARISON:  Pelvic ultrasound performed 07/02/2013  FINDINGS: Uterus  Measurements: 7.4 x 3.6 x 5.2 cm. Note is made of an anterior intramural fibroid measuring 1.5 x 1.1 x 0.8 cm.  Endometrium  Thickness: 1.5 cm.  No focal abnormality visualized.  Right ovary  Measurements: 4.4 x 2.9 x 3.7 cm. A complex cystic structure is again noted within the right ovary measuring 3.9 x 2.4 x 3.1 cm, compatible with a  smaller recurrent or residual right-sided hemorrhagic cyst or endometrioma.  Left  ovary  Measurements: 3.7 x 2.2 x 2.3 cm. Normal appearance/no adnexal mass.  Other findings  No free fluid.  IMPRESSION: 1. 3.9 x 2.4 x 3.1 cm complex right ovarian cyst noted, reflecting a smaller recurrent or residual right-sided hemorrhagic cyst or endometrioma. This is significantly smaller than the 6.0 cm complex cyst seen in March. 2. 1.5 cm anterior intramural fibroid again noted. Uterus otherwise unremarkable in appearance.   Electronically Signed   By: Garald Balding M.D.   On: 01/08/2014 22:26   US Pelvis Complete  01/08/2014   CLINICAL DATA:  Vaginal bleeding since December 18, 2013, with intermittent pelvic pain since laparoscopic right ovarian cyst drainage on November 11, 2013.  EXAM: TRANSABDOMINAL AND TRANSVAGINAL ULTRASOUND OF PELVIS  TECHNIQUE: Both transabdominal and transvaginal ultrasound examinations of the pelvis were performed. Transabdominal technique was performed for global imaging of the pelvis including uterus, ovaries, adnexal regions, and pelvic cul-de-sac. It was necessary to proceed with endovaginal exam following the transabdominal exam to visualize the uterus and ovaries in greater detail.  COMPARISON:  Pelvic ultrasound performed 07/02/2013  FINDINGS: Uterus  Measurements: 7.4 x 3.6 x 5.2 cm. Note is made of an anterior intramural fibroid measuring 1.5 x 1.1 x 0.8 cm.  Endometrium  Thickness: 1.5 cm.  No focal abnormality visualized.  Right ovary  Measurements: 4.4 x 2.9 x 3.7 cm. A complex cystic structure is again noted within the right ovary measuring 3.9 x 2.4 x 3.1 cm, compatible with a smaller recurrent or residual right-sided hemorrhagic cyst or endometrioma.  Left ovary  Measurements: 3.7 x 2.2 x 2.3 cm. Normal appearance/no adnexal mass.  Other findings  No free fluid.  IMPRESSION: 1. 3.9 x 2.4 x 3.1 cm complex right ovarian cyst noted, reflecting a smaller recurrent or residual right-sided  hemorrhagic cyst or endometrioma. This is significantly smaller than the 6.0 cm complex cyst seen in March. 2. 1.5 cm anterior intramural fibroid again noted. Uterus otherwise unremarkable in appearance.   Electronically Signed   By: Garald Balding M.D.   On: 01/08/2014 22:26     Assessment and Plan   1. Endometrioma of ovary   Comfort measures Patient will consider menstrual suppression  Follow-up Information   Follow up with [facility]. (01/18/14 at 3:00 PM )    Specialty:  Obstetrics and Gynecology   Contact information:   [place] [address] [phone]       Mathis Bud 01/08/2014, 9:04 PM

## 2014-01-09 ENCOUNTER — Telehealth: Payer: Self-pay | Admitting: General Practice

## 2014-01-09 LAB — HIV ANTIBODY (ROUTINE TESTING W REFLEX): HIV 1&2 Ab, 4th Generation: NONREACTIVE

## 2014-01-09 LAB — GC/CHLAMYDIA PROBE AMP
CT PROBE, AMP APTIMA: NEGATIVE
GC Probe RNA: NEGATIVE

## 2014-01-09 NOTE — MAU Provider Note (Signed)
Attestation of Attending Supervision of Advanced Practitioner (CNM/NP): Evaluation and management procedures were performed by the Advanced Practitioner under my supervision and collaboration.  I have reviewed the Advanced Practitioner's note and chart, and I agree with the management and plan.  Toni Hoffmeister 01/09/2014 2:40 AM

## 2014-01-09 NOTE — Telephone Encounter (Signed)
Called pt and left message that we are returning her call to please call the office.

## 2014-01-09 NOTE — Telephone Encounter (Signed)
Patient called and left message stating she was given an appt for here when she was in the ER last night for 10/14 @ 3pm but she is having severe problems and needs a sooner appt.

## 2014-01-10 ENCOUNTER — Telehealth: Payer: Self-pay

## 2014-01-10 ENCOUNTER — Encounter: Payer: Self-pay | Admitting: *Deleted

## 2014-01-10 DIAGNOSIS — N8329 Other ovarian cysts: Secondary | ICD-10-CM | POA: Diagnosis not present

## 2014-01-10 DIAGNOSIS — N9489 Other specified conditions associated with female genital organs and menstrual cycle: Secondary | ICD-10-CM | POA: Diagnosis not present

## 2014-01-10 NOTE — Telephone Encounter (Signed)
Attempted to contact patient, no answer, left message that we were returning her call to the clinic. Will send letter.  Letter sent.

## 2014-01-10 NOTE — Telephone Encounter (Signed)
Patient called stating she has an appointment in clinic for 01/18/14 but is having severe problems and would like to be seen sooner, requests a call back. Called patient who states "I'm not OK. I had an ovarian endometrioma that was drained on 8/7 and it is back. I was seen in ED two days ago and they told me my ovary is 3-4cm. I went to my previous GYN today to get put on Norethrindone but he put me on Lo-estrin and I just don't know if I want to start that. He told me the only cure is surgery. I have been bleeding for 3 weeks and I want something that is going to stop this." Consulted Dr. Nehemiah Settle who states the standard treatment for endometrioma is OCP and he would likely do the same treatment. Informed patient of this. Patient states she is not she what she is going to do and still wants to keep appointment on 01/18/14. Informed patient this was OK but to start treatment RX by provider today. Patient verbalized understanding. No further questions or concerns.

## 2014-01-11 ENCOUNTER — Ambulatory Visit (INDEPENDENT_AMBULATORY_CARE_PROVIDER_SITE_OTHER): Payer: Medicare Other | Admitting: Family Medicine

## 2014-01-11 VITALS — BP 98/72 | HR 84 | Temp 98.1°F | Resp 16 | Ht 68.0 in | Wt 130.4 lb

## 2014-01-11 DIAGNOSIS — N939 Abnormal uterine and vaginal bleeding, unspecified: Secondary | ICD-10-CM

## 2014-01-11 DIAGNOSIS — N83201 Unspecified ovarian cyst, right side: Secondary | ICD-10-CM

## 2014-01-11 DIAGNOSIS — N809 Endometriosis, unspecified: Secondary | ICD-10-CM | POA: Diagnosis not present

## 2014-01-11 DIAGNOSIS — N80129 Deep endometriosis of ovary, unspecified ovary: Secondary | ICD-10-CM

## 2014-01-11 DIAGNOSIS — N8320 Unspecified ovarian cysts: Secondary | ICD-10-CM | POA: Diagnosis not present

## 2014-01-11 DIAGNOSIS — N923 Ovulation bleeding: Secondary | ICD-10-CM

## 2014-01-11 DIAGNOSIS — N920 Excessive and frequent menstruation with regular cycle: Secondary | ICD-10-CM

## 2014-01-11 NOTE — Progress Notes (Signed)
Chief Complaint:  Chief Complaint  Patient presents with  . Advice Only    Pt wants 2 nd opinion on BCP she was prescribed by GYN  . Vaginal Bleeding    x 3 1/2 -4 wks    HPI: Catherine Olsen is a 43 y.o. female who is here for  Counseling about taking OCP and smoking. She is here to see if she should take OCP to help with her spotting issues and her ovarian cyst/endomtrioma whichis present on [place] after undergoing a laparoscopic right ovarian cystectomy in 11/2013 by Dr Karel Jarvis. She had post op pain and vaginal bleeding after the surgery. She recently asked them to do a [place] and the cyst is still presnt but smaller. She had bleeding after the surgery. She occasionally still is bleeding, but more spotting. Her regular ob/gyn is Dr Nori Riis, who she has seen recommends that she be on JUnel Fe to help with decreasing the sixe of the cyst. She wants to know the risk and benefits sicne she does not want to have an oophrectomy.  EXAM:  TRANSABDOMINAL AND TRANSVAGINAL ULTRASOUND OF PELVIS  TECHNIQUE:  IMPRESSION:  1. 3.9 x 2.4 x 3.1 cm complex right ovarian cyst noted, reflecting a  smaller recurrent or residual right-sided hemorrhagic cyst or  endometrioma. This is significantly smaller than the 6.0 cm complex  cyst seen in March.  2. 1.5 cm anterior intramural fibroid again noted. Uterus otherwise  unremarkable in appearance.  Electronically Signed  By: Garald Balding M.D.  On: 01/08/2014 22:26   She is a 30 year 1 ppd smoker, she only smoked 1 cig today. She is also bipolar. Her psychiatrist thinsk the birth control may stabilize her mood or it can make it worse.    Past Medical History  Diagnosis Date  . Asthma   . Anxiety   . Mental disorder   . Bipolar 1 disorder   . Endometriosis   . PONV (postoperative nausea and vomiting)   . Termination of pregnancy     x 2 at age 38 and 43 yrs old  . Depression   . Headache(784.0)     otc med prn  . Arthritis     hands, lower back, knees   . Fibromyalgia   . Scoliosis   . Scoliosis    Past Surgical History  Procedure Laterality Date  . Nose surgery      rhinoplasty at age 79 yrs  . Wisdom tooth extraction    . Facial cosmetic surgery      right cheek  . Laparoscopy Right 11/11/2013    Procedure: LAPAROSCOPY OPERATIVE with  Drainage of  RIGHT Ovarian ENDOMETRIOMA;  Surgeon: Elveria Royals, MD;  Location: [facility];  Service: Gynecology;  Laterality: Right;   History   Social History  . Marital Status: Single    Spouse Name: N/A    Number of Children: N/A  . Years of Education: N/A   Social History Main Topics  . Smoking status: Current Every Day Smoker -- 0.50 packs/day for 29 years    Types: Cigarettes, E-cigarettes  . Smokeless tobacco: Never Used  . Alcohol Use: No     Comment: "rare"  . 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 Sexual Activity: No     Comment: abortion at 17 and 21yrs. of age    Other Topics Concern  . None   Social History Narrative  .  None   Family History  Problem Relation Age of Onset  . Diabetes Mother   . Hypertension Mother    Allergies  Allergen Reactions  . Sulfa Antibiotics Hives  . Other Anxiety    steroids   Prior to Admission medications   Medication Sig Start Date End Date Taking? Authorizing Provider  albuterol (PROVENTIL HFA;VENTOLIN HFA) 108 (90 BASE) MCG/ACT inhaler Inhale 1-2 puffs into the lungs every 6 (six) hours as needed for wheezing or shortness of breath. 09/02/13  Yes Greyden Besecker P Kaniya Trueheart, DO  diphenhydrAMINE (BENADRYL) 25 MG tablet Take 50-100 mg by mouth at bedtime as needed for sleep.    Yes Historical Provider, MD  Multiple Vitamin (MULTIVITAMIN WITH MINERALS) TABS tablet Take 1 tablet by mouth daily.   Yes Historical Provider, MD  ARIPiprazole (ABILIFY) 5 MG tablet Take 2.5 mg by mouth at bedtime.     Historical Provider, MD  clonazePAM (KLONOPIN) 2 MG tablet Take 2-4 mg by mouth at bedtime as needed (sleep).    Historical  Provider, MD  COPPER PO Take 1 capsule by mouth 2 (two) times a week.    Historical Provider, MD  ibuprofen (ADVIL) 200 MG tablet Take 3 tablets (600 mg total) by mouth every 6 (six) hours as needed. 11/11/13   Elveria Royals, MD  Magnesium 250 MG TABS Take 250 mg by mouth 2 (two) times a week.     Historical Provider, MD  oxyCODONE-acetaminophen (ROXICET) 5-325 MG per tablet Take 1 tablet by mouth every 6 (six) hours as needed for severe pain. 11/11/13   Elveria Royals, MD  selenium 50 MCG TABS tablet Take 100 mcg by mouth 2 (two) times a week.     Historical Provider, MD  vitamin C (ASCORBIC ACID) 250 MG tablet Take 250 mg by mouth 2 (two) times a week.    Historical Provider, MD  zinc sulfate 220 MG capsule Take 220 mg by mouth 2 (two) times a week.    Historical Provider, MD     ROS: The patient denies fevers, chills, night sweats, unintentional weight loss, chest pain, palpitations, wheezing, dyspnea on exertion, nausea, vomiting, abdominal pain, dysuria, hematuria, melena, numbness, weakness, or tingling.   All other systems have been reviewed and were otherwise negative with the exception of those mentioned in the HPI and as above.    PHYSICAL EXAM: Filed Vitals:   01/11/14 1759  BP: 98/72  Pulse: 84  Temp: 98.1 F (36.7 C)  Resp: 16   Filed Vitals:   01/11/14 1759  Height: 5\' 8"  (1.727 m)  Weight: 130 lb 6.4 oz (59.149 kg)   Body mass index is 19.83 kg/(m^2).  General: Alert, no acute distress, thin female HEENT:  Normocephalic, atraumatic, oropharynx patent. EOMI, PERRLA Cardiovascular:  Regular rate and rhythm, no rubs murmurs or gallops.  No Carotid bruits, radial pulse intact. No pedal edema.  Respiratory: Clear to auscultation bilaterally.  No wheezes, rales, or rhonchi.  No cyanosis, no use of accessory musculature GI: No organomegaly, abdomen is soft and non-tender, positive bowel sounds.  No masses. Skin: No rashes. Neurologic: Facial musculature  symmetric. Psychiatric: Patient is appropriate throughout our interaction. Lymphatic: No cervical lymphadenopathy Musculoskeletal: Gait intact.   LABS: Results for orders placed during the hospital encounter of 01/08/14  WET PREP, GENITAL      Result Value Ref Range   Yeast Wet Prep HPF POC NONE SEEN  NONE SEEN   Trich, Wet Prep NONE SEEN  NONE SEEN  Clue Cells Wet Prep HPF POC FEW (*) NONE SEEN   WBC, Wet Prep HPF POC FEW (*) NONE SEEN  GC/CHLAMYDIA PROBE AMP      Result Value Ref Range   CT Probe RNA NEGATIVE  NEGATIVE   GC Probe RNA NEGATIVE  NEGATIVE  CBC      Result Value Ref Range   WBC 9.0  4.0 - 10.5 K/uL   RBC 4.45  3.87 - 5.11 MIL/uL   Hemoglobin 14.5  12.0 - 15.0 g/dL   HCT 41.9  36.0 - 46.0 %   MCV 94.2  78.0 - 100.0 fL   MCH 32.6  26.0 - 34.0 pg   MCHC 34.6  30.0 - 36.0 g/dL   RDW 14.0  11.5 - 15.5 %   Platelets 203  150 - 400 K/uL  HIV ANTIBODY (ROUTINE TESTING)      Result Value Ref Range   HIV 1&2 Ab, 4th Generation NONREACTIVE  NONREACTIVE     EKG/XRAY:   Primary read interpreted by Dr. Marin Comment at [facility].   ASSESSMENT/PLAN: Encounter Diagnoses  Name Primary?  . Cyst of right ovary Yes  . Endometrioma   . Spotting between menses    43 y/o female with a PMH of tobacco abuse/dependence , bipolar 1, uterin fibroid and right sided ovarian cyst who underwent laparoscpic draiange of the cyst in August 2015, the cyst is still present either as residula or recurrence on [place] from 01/08/2014. She is here because she wants to know the risk of PE/DVT/CVA from OCP use and her smoking history. She wants to try the OCP because she is afraid of and does not want to get an oophrectomy. We went through the risk and benefits.We discussed the absolute numbers based on UpToDate She should really just stop smoking which she is planning to do before she starts her OCPs I have encouraged her to do this She is going to try it cold [place] F/u prn    Gross sideeffects, risk and  benefits, and alternatives of medications d/w patient. Patient is aware that all medications have potential sideeffects and we are unable to predict every sideeffect or drug-drug interaction that may occur.  Melodee Lupe, [place], DO 01/12/2014 5:55 AM

## 2014-01-12 ENCOUNTER — Inpatient Hospital Stay (HOSPITAL_COMMUNITY)
Admission: AD | Admit: 2014-01-12 | Discharge: 2014-01-12 | Disposition: A | Discharge status: Home / Self Care | Payer: Medicare Other | Source: Ambulatory Visit | Attending: Obstetrics and Gynecology | Admitting: Obstetrics and Gynecology

## 2014-01-12 ENCOUNTER — Encounter (HOSPITAL_COMMUNITY): Payer: Self-pay | Admitting: *Deleted

## 2014-01-12 DIAGNOSIS — N938 Other specified abnormal uterine and vaginal bleeding: Secondary | ICD-10-CM | POA: Insufficient documentation

## 2014-01-12 DIAGNOSIS — F17290 Nicotine dependence, other tobacco product, uncomplicated: Secondary | ICD-10-CM | POA: Insufficient documentation

## 2014-01-12 DIAGNOSIS — N939 Abnormal uterine and vaginal bleeding, unspecified: Secondary | ICD-10-CM | POA: Diagnosis present

## 2014-01-12 LAB — URINALYSIS, ROUTINE W REFLEX MICROSCOPIC
Bilirubin Urine: NEGATIVE
Glucose, UA: NEGATIVE mg/dL
KETONES UR: NEGATIVE mg/dL
LEUKOCYTES UA: NEGATIVE
Nitrite: NEGATIVE
Protein, ur: NEGATIVE mg/dL
Specific Gravity, Urine: 1.01 (ref 1.005–1.030)
Urobilinogen, UA: 0.2 mg/dL (ref 0.0–1.0)
pH: 7.5 (ref 5.0–8.0)

## 2014-01-12 LAB — URINE MICROSCOPIC-ADD ON

## 2014-01-12 MED ORDER — MEGESTROL ACETATE 40 MG PO TABS: 40.0000 mg | ORAL_TABLET | Freq: Two times a day (BID) | ORAL | Status: DC

## 2014-01-12 NOTE — Discharge Instructions (Signed)
Abnormal Uterine Bleeding Abnormal uterine bleeding can affect women at various stages in life, including teenagers, women in their reproductive years, pregnant women, and women who have reached menopause. Several kinds of uterine bleeding are considered abnormal, including:  Bleeding or spotting between periods.   Bleeding after sexual intercourse.   Bleeding that is heavier or more than normal.   Periods that last longer than usual.  Bleeding after menopause.  Many cases of abnormal uterine bleeding are minor and simple to treat, while others are more serious. Any type of abnormal bleeding should be evaluated by your health care provider. Treatment will depend on the cause of the bleeding. HOME CARE INSTRUCTIONS Monitor your condition for any changes. The following actions may help to alleviate any discomfort you are experiencing:  Avoid the use of tampons and douches as directed by your health care provider.  Change your pads frequently. You should get regular pelvic exams and Pap tests. Keep all follow-up appointments for diagnostic tests as directed by your health care provider.  SEEK MEDICAL CARE IF:   Your bleeding lasts more than 1 week.   You feel dizzy at times.  SEEK IMMEDIATE MEDICAL CARE IF:   You pass out.   You are changing pads every 15 to 30 minutes.   You have abdominal pain.  You have a fever.   You become sweaty or weak.   You are passing large blood clots from the vagina.   You start to feel nauseous and vomit. MAKE SURE YOU:   Understand these instructions.  Will watch your condition.  Will get help right away if you are not doing well or get worse. Document Released: 03/24/2005 Document Revised: 03/29/2013 Document Reviewed: 10/21/2012 ExitCare Patient Information 2015 [employer]. This information is not intended to replace advice given to you by your health care provider. Make sure you discuss any questions you have with your  health care provider.  

## 2014-01-12 NOTE — MAU Note (Signed)
Pt reports vaginal bleeding x 4 weeks, seen in MAU last week. Off/on heavy bleeding, became heavy tonight. Denies pain.

## 2014-01-12 NOTE — MAU Provider Note (Signed)
History     CSN: [id number]  Arrival date and time: 01/12/14 0103   First Provider Initiated Contact with Patient 01/12/14 0139      Chief Complaint  Patient presents with  . Vaginal Bleeding   HPI This is a 43 y.o. female who presents with c/o persistent vaginal bleeding. Has been bleeding off and on for 4 weeks. Sometimes heavy sometimes light. Heavy today, worried she was hemorrhaging. History taking is difficulty due to rapid and rambling speech. She has been to several OB/GYNs and a primary care doctor. She states she "will not disclose" who her most recent OB/GYN doctor is, because she might not go back there. Had an endometrioma removed in August but "it is growing back so they ordered me to take birth control pills". Wants to "save my ovary".  Has not started pills due to smoking and risk. Wants to "quit cold [place] tomorrow", then wait a week to take them. Wants something to stop bleeding now. Refuses pelvic or blood draws. Just had a CBC yesterday with Hemoglobin >14.  They plan to check thyroid but she refused blood draw.    Has an appointment in our clinic in a few weeks but does not want to wait.   RN Note:  Pt reports vaginal bleeding x 4 weeks, seen in MAU last week. Off/on heavy bleeding, became heavy tonight. Denies pain.       OB History   Grav Para Term Preterm Abortions TAB SAB Ect Mult Living   2    2 2     0      Past Medical History  Diagnosis Date  . Asthma   . Anxiety   . Mental disorder   . Bipolar 1 disorder   . Endometriosis   . PONV (postoperative nausea and vomiting)   . Termination of pregnancy     x 2 at age 5 and 43 yrs old  . Depression   . Headache(784.0)     otc med prn  . Arthritis     hands, lower back, knees  . Fibromyalgia   . Scoliosis   . Scoliosis     Past Surgical History  Procedure Laterality Date  . Nose surgery      rhinoplasty at age 55 yrs  . Wisdom tooth extraction    . Facial cosmetic surgery      right cheek   . Laparoscopy Right 11/11/2013    Procedure: LAPAROSCOPY OPERATIVE with  Drainage of  RIGHT Ovarian ENDOMETRIOMA;  Surgeon: Elveria Royals, MD;  Location: [facility];  Service: Gynecology;  Laterality: Right;    Family History  Problem Relation Age of Onset  . Diabetes Mother   . Hypertension Mother     History  Substance Use Topics  . Smoking status: Current Every Day Smoker -- 0.50 packs/day for 29 years    Types: Cigarettes, E-cigarettes  . Smokeless tobacco: Never Used  . Alcohol Use: No     Comment: "rare"    Allergies:  Allergies  Allergen Reactions  . Sulfa Antibiotics Hives  . Other Anxiety    steroids    Prescriptions prior to admission  Medication Sig Dispense Refill  . temazepam (RESTORIL) 15 MG capsule Take 15 mg by mouth at bedtime as needed for sleep.      Marland Kitchen albuterol (PROVENTIL HFA;VENTOLIN HFA) 108 (90 BASE) MCG/ACT inhaler Inhale 1-2 puffs into the lungs every 6 (six) hours as needed for wheezing or shortness of breath.  1 Inhaler  2  . ARIPiprazole (ABILIFY) 5 MG tablet Take 2.5 mg by mouth at bedtime.       . clonazePAM (KLONOPIN) 2 MG tablet Take 2-4 mg by mouth at bedtime as needed (sleep).      . COPPER PO Take 1 capsule by mouth 2 (two) times a week.      . diphenhydrAMINE (BENADRYL) 25 MG tablet Take 50-100 mg by mouth at bedtime as needed for sleep.       Marland Kitchen ibuprofen (ADVIL) 200 MG tablet Take 3 tablets (600 mg total) by mouth every 6 (six) hours as needed.  30 tablet  0  . Magnesium 250 MG TABS Take 250 mg by mouth 2 (two) times a week.       . Multiple Vitamin (MULTIVITAMIN WITH MINERALS) TABS tablet Take 1 tablet by mouth daily.      Marland Kitchen oxyCODONE-acetaminophen (ROXICET) 5-325 MG per tablet Take 1 tablet by mouth every 6 (six) hours as needed for severe pain.  20 tablet  0  . selenium 50 MCG TABS tablet Take 100 mcg by mouth 2 (two) times a week.       . vitamin C (ASCORBIC ACID) 250 MG tablet Take 250 mg by mouth 2 (two) times a week.      . zinc  sulfate 220 MG capsule Take 220 mg by mouth 2 (two) times a week.        Review of Systems  Constitutional: Negative for fever, chills and malaise/fatigue.  Gastrointestinal: Negative for nausea, vomiting and abdominal pain.  Genitourinary:       Vaginal bleeding   Neurological: Negative for dizziness and weakness.   Physical Exam   Blood pressure 116/86, pulse 101, temperature 98.7 F (37.1 C), temperature source Oral, resp. rate 18, height 5\' 8"  (1.727 m), weight 130 lb (58.968 kg), last menstrual period 12/18/2013, SpO2 94.00%.  Physical Exam  Constitutional: She is oriented to person, place, and time. She appears well-developed and well-nourished. No distress.  HENT:  Head: Normocephalic.  Cardiovascular: Normal rate.   Respiratory: Effort normal.  GI: Soft. She exhibits no distension. There is no tenderness. There is no rebound and no guarding.  Genitourinary:  Declined exam   Musculoskeletal: Normal range of motion.  Neurological: She is alert and oriented to person, place, and time.  Skin: Skin is warm and dry.  Psychiatric: She has a normal mood and affect.  Rapid speech Rambling stories    MAU Course  Procedures  MDM Results for orders placed during the hospital encounter of 01/12/14 (from the past 72 hour(s))  URINALYSIS, ROUTINE W REFLEX MICROSCOPIC     Status: Abnormal   Collection Time    01/12/14  1:13 AM      Result Value Ref Range   Color, Urine YELLOW  YELLOW   APPearance CLOUDY (*) CLEAR   Specific Gravity, Urine 1.010  1.005 - 1.030   pH 7.5  5.0 - 8.0   Glucose, UA NEGATIVE  NEGATIVE mg/dL   Hgb urine dipstick LARGE (*) NEGATIVE   Bilirubin Urine NEGATIVE  NEGATIVE   Ketones, ur NEGATIVE  NEGATIVE mg/dL   Protein, ur NEGATIVE  NEGATIVE mg/dL   Urobilinogen, UA 0.2  0.0 - 1.0 mg/dL   Nitrite NEGATIVE  NEGATIVE   Leukocytes, UA NEGATIVE  NEGATIVE  URINE MICROSCOPIC-ADD ON     Status: None   Collection Time    01/12/14  1:13 AM      Result  Value Ref Range  Squamous Epithelial / LPF RARE  RARE   WBC, UA 0-2  <3 WBC/hpf   RBC / HPF 7-10  <3 RBC/hpf   Bacteria, UA RARE  RARE   Urine-Other AMORPHOUS URATES/PHOSPHATES     Results for SICILY, ZARAGOZA (MRN [id number]) as of 01/12/2014 01:06  Ref. Range 01/08/2014 20:55  Hemoglobin Latest Range: 12.0-15.0 g/dL 14.5    Assessment and Plan  A:  Dysfunctional Uterine bleeding, probably anovulatory      No anemia  P:  Discussed need to follow MD advice       Rx Megace low dose taper for control of bleeding now, until appointment later this month       Explained I cannot address long term issues or weight loss in ED setting.        Followup with primary doctor  [facility] 01/12/2014, 2:14 AM

## 2014-01-13 ENCOUNTER — Telehealth: Payer: Self-pay | Admitting: Advanced Practice Midwife

## 2014-01-13 ENCOUNTER — Encounter: Payer: Medicare Other | Admitting: Family Medicine

## 2014-01-13 NOTE — Telephone Encounter (Signed)
Pt called because she is concerned that vaginal bleeding has not stopped after taking Megace for 24 hours.  Pt reports she took 40 mg last night, and then thought she took 40 mg this morning but found the pill on her chest when she woke up.  She is unsure if she took one pill and got out a second one or if she never took any pill this am.  She is due to take another 40 mg tonight as the medication is ordered BID x 3 days then daily.  Discussed with pt how Megace will not work if she has only taken one pill and then skipped a dose.  Recommend taking 2 Megace tablets at the same time daily to make regimen easier.  Take 3 days of 2 tabs/day, then switch to daily as prescribed.  Bleeding should slow/stop after 3-5 days of treatment. Keep scheduled appt in [place] on 10/14.  She should return to MAU if bleeding >1pad/hour or having dizziness with bleeding.

## 2014-01-14 NOTE — MAU Provider Note (Signed)
Attestation of Attending Supervision of Advanced Practitioner: Evaluation and management procedures were performed by the PA/NP/CNM/OB Fellow under my supervision/collaboration. Chart reviewed and agree with management and plan.  Bennet Kujawa V 01/14/2014 8:05 PM

## 2014-01-16 DIAGNOSIS — F3132 Bipolar disorder, current episode depressed, moderate: Secondary | ICD-10-CM | POA: Diagnosis not present

## 2014-01-16 DIAGNOSIS — F4312 Post-traumatic stress disorder, chronic: Secondary | ICD-10-CM | POA: Diagnosis not present

## 2014-01-18 ENCOUNTER — Encounter: Payer: Self-pay | Admitting: Family Medicine

## 2014-01-18 ENCOUNTER — Ambulatory Visit (INDEPENDENT_AMBULATORY_CARE_PROVIDER_SITE_OTHER): Payer: Medicare Other | Admitting: Family Medicine

## 2014-01-18 VITALS — BP 116/83 | HR 74 | Temp 98.5°F | Ht 68.0 in | Wt 131.7 lb

## 2014-01-18 DIAGNOSIS — N801 Endometriosis of ovary: Secondary | ICD-10-CM | POA: Diagnosis not present

## 2014-01-18 DIAGNOSIS — N80129 Deep endometriosis of ovary, unspecified ovary: Secondary | ICD-10-CM | POA: Insufficient documentation

## 2014-01-18 DIAGNOSIS — F17200 Nicotine dependence, unspecified, uncomplicated: Secondary | ICD-10-CM

## 2014-01-18 DIAGNOSIS — N939 Abnormal uterine and vaginal bleeding, unspecified: Secondary | ICD-10-CM

## 2014-01-18 NOTE — Progress Notes (Signed)
   Subjective:    Patient ID: Catherine Olsen, female    DOB: Sep 30, 1970, 43 y.o.   MRN: [id number]  HPI Patient complains of daily, heavy bleeding for 3 weeks.  Went to MAU and was prescribed Megace, but did not take it as prescribed - took 40mg  daily for "a few days", then 20mg  for a few days, and will be going on 10mg  for a few days.  Thinks bleeding is decreasing.  PCP will be testing TSH.  Doesn't want any other blood testing or exams.  Also has an endometrioma that she had drained per her request on 11/11/13 by Dr Benjie Karvonen.  Had [place] on 01/08/14 that showed recurrance of the endometrioma to about 4cm.  Her primary OB had recommended starting combined OCPs, but she is a smoker.  She denies pain.   Review of Systems  Constitutional: Negative for fever, chills and fatigue.  Gastrointestinal: Negative for nausea, vomiting, abdominal pain, diarrhea, constipation and abdominal distention.  Genitourinary: Negative for decreased urine volume, vaginal discharge and pelvic pain.       Objective:   Physical Exam  Constitutional: She appears well-developed and well-nourished.  Pulmonary/Chest: Effort normal.  Psychiatric: She has a normal mood and affect. Her behavior is normal. Judgment and thought content normal.  Patient declined any further exam.      Assessment & Plan:   Problem List Items Addressed This Visit   Abnormal uterine bleeding (AUB) - Primary   Endometrioma of ovary     Discussed that since the bleeding is decreasing, may stop as she tapers of the megace.  If bleeding continues, may try provera.  Additionally, discussed options for endometrioma.  As she is a smoker and over 40, I would hesitate to place her on combined OCP as that would place her in a hypercoagulable state and make her more prone to DVT and PE.  Discussed that the reoccurrence of the endometrioma is not surprising as it was only drained the last time.  I discussed with her that removal of the cyst has less  reoccurrence.  Discussed options of cystectomy vs oopherectomy.  Patient declined both of these at present time.  Will f/u PRN.  30 minutes spent with patient with 100% of time in discussion of patient regarding options of management.

## 2014-01-31 ENCOUNTER — Telehealth: Payer: Self-pay | Admitting: *Deleted

## 2014-01-31 DIAGNOSIS — N80129 Deep endometriosis of ovary, unspecified ovary: Secondary | ICD-10-CM

## 2014-01-31 DIAGNOSIS — N801 Endometriosis of ovary: Secondary | ICD-10-CM

## 2014-01-31 NOTE — Telephone Encounter (Addendum)
Pt left message stating that she has Hx of ovarian endometrioma which was drained laparoscopically a couple months ago. She had a recent US which showed it was returning and was 4cm at the time.  She is having pain and feels it is even bigger now so she is requesting another [place] to reassess. Phone consult with Dr. Nehemiah Settle who ordered repeat US. I returned pt's call and heard message stating that her mailbox is full - unable to leave message.  Order placed for [place] and can be scheduled once pt can be reached.  10/28  1030  Called pt again and was unable to leave message as the mailbox is still full.

## 2014-02-01 NOTE — Telephone Encounter (Signed)
Pt called the front desk and was informed that the provider has ordered an rpt [place].  Pt stated that she could have [place] scheduled after noon and that if she does not pick up that we can leave a message.  [place] scheduled for October 30,2015 @ 1530, pt informed of appt.

## 2014-02-03 ENCOUNTER — Other Ambulatory Visit: Payer: Self-pay | Admitting: Family Medicine

## 2014-02-03 ENCOUNTER — Encounter: Payer: Self-pay | Admitting: Family Medicine

## 2014-02-03 ENCOUNTER — Ambulatory Visit (INDEPENDENT_AMBULATORY_CARE_PROVIDER_SITE_OTHER): Payer: Medicare Other | Admitting: Family Medicine

## 2014-02-03 ENCOUNTER — Ambulatory Visit (HOSPITAL_COMMUNITY)
Admission: RE | Admit: 2014-02-03 | Discharge: 2014-02-03 | Disposition: A | Discharge status: Home / Self Care | Payer: Medicare Other | Source: Ambulatory Visit | Attending: Family Medicine | Admitting: Family Medicine

## 2014-02-03 VITALS — BP 90/60 | HR 70 | Temp 97.5°F | Resp 16 | Ht 68.0 in | Wt 130.2 lb

## 2014-02-03 DIAGNOSIS — L659 Nonscarring hair loss, unspecified: Secondary | ICD-10-CM | POA: Diagnosis not present

## 2014-02-03 DIAGNOSIS — N8320 Unspecified ovarian cysts: Secondary | ICD-10-CM | POA: Diagnosis not present

## 2014-02-03 DIAGNOSIS — Z8349 Family history of other endocrine, nutritional and metabolic diseases: Secondary | ICD-10-CM

## 2014-02-03 DIAGNOSIS — Z130 Encounter for screening for diseases of the blood and blood-forming organs and certain disorders involving the immune mechanism: Secondary | ICD-10-CM

## 2014-02-03 DIAGNOSIS — N809 Endometriosis, unspecified: Secondary | ICD-10-CM | POA: Diagnosis not present

## 2014-02-03 DIAGNOSIS — N938 Other specified abnormal uterine and vaginal bleeding: Secondary | ICD-10-CM | POA: Diagnosis not present

## 2014-02-03 DIAGNOSIS — M25579 Pain in unspecified ankle and joints of unspecified foot: Secondary | ICD-10-CM

## 2014-02-03 DIAGNOSIS — Z1329 Encounter for screening for other suspected endocrine disorder: Secondary | ICD-10-CM | POA: Diagnosis not present

## 2014-02-03 DIAGNOSIS — N80129 Deep endometriosis of ovary, unspecified ovary: Secondary | ICD-10-CM

## 2014-02-03 DIAGNOSIS — N801 Endometriosis of ovary: Secondary | ICD-10-CM | POA: Diagnosis not present

## 2014-02-03 DIAGNOSIS — Z83438 Family history of other disorder of lipoprotein metabolism and other lipidemia: Secondary | ICD-10-CM

## 2014-02-03 DIAGNOSIS — N9489 Other specified conditions associated with female genital organs and menstrual cycle: Secondary | ICD-10-CM | POA: Diagnosis not present

## 2014-02-03 DIAGNOSIS — Z113 Encounter for screening for infections with a predominantly sexual mode of transmission: Secondary | ICD-10-CM

## 2014-02-03 LAB — CBC WITH DIFFERENTIAL/PLATELET
Basophils Absolute: 0 10*3/uL (ref 0.0–0.1)
Basophils Relative: 0 % (ref 0–1)
Eosinophils Absolute: 0.4 10*3/uL (ref 0.0–0.7)
Eosinophils Relative: 5 % (ref 0–5)
HCT: 42.7 % (ref 36.0–46.0)
Hemoglobin: 14.9 g/dL (ref 12.0–15.0)
Lymphocytes Relative: 28 % (ref 12–46)
Lymphs Abs: 2.2 10*3/uL (ref 0.7–4.0)
MCH: 32.7 pg (ref 26.0–34.0)
MCHC: 34.9 g/dL (ref 30.0–36.0)
MCV: 93.6 fL (ref 78.0–100.0)
Monocytes Absolute: 0.6 10*3/uL (ref 0.1–1.0)
Monocytes Relative: 7 % (ref 3–12)
Neutro Abs: 4.7 10*3/uL (ref 1.7–7.7)
Neutrophils Relative %: 60 % (ref 43–77)
Platelets: 243 10*3/uL (ref 150–400)
RBC: 4.56 MIL/uL (ref 3.87–5.11)
RDW: 13.7 % (ref 11.5–15.5)
WBC: 7.9 10*3/uL (ref 4.0–10.5)

## 2014-02-03 LAB — COMPLETE METABOLIC PANEL WITH GFR
ALT: 11 U/L (ref 0–35)
Alkaline Phosphatase: 57 U/L (ref 39–117)
BUN: 9 mg/dL (ref 6–23)
CO2: 23 mEq/L (ref 19–32)
Calcium: 9.1 mg/dL (ref 8.4–10.5)
Chloride: 104 mEq/L (ref 96–112)
GFR, Est African American: >89 mL/min
GFR, Est Non African American: >89 mL/min
Glucose, Bld: 74 mg/dL (ref 70–99)
Sodium: 137 mEq/L (ref 135–145)
Total Bilirubin: 0.6 mg/dL (ref 0.2–1.2)
Total Protein: 6.9 g/dL (ref 6.0–8.3)

## 2014-02-03 LAB — TSH: TSH: 1.049 u[IU]/mL (ref 0.350–4.500)

## 2014-02-03 LAB — COMPLETE METABOLIC PANEL WITHOUT GFR
AST: 11 U/L (ref 0–37)
Albumin: 4.5 g/dL (ref 3.5–5.2)
Creat: 0.71 mg/dL (ref 0.50–1.10)
Potassium: 4.1 meq/L (ref 3.5–5.3)

## 2014-02-03 NOTE — Progress Notes (Signed)
 Chief Complaint:  Chief Complaint  Patient presents with  . Follow-up    thyroid    HPI: Catherine Olsen is a 43 y.o. female who is here for labwork. She does not want an annual, she just wants labwork done for screening for any hormone imbalance since she has endometriosis and an ovarian cyst that they think may be an endometrioma and AUB. Currently since she stopped eating dairy she also stopped bleeding.    She gets her mammogram with Dr Excell Seltzer She thinks her last pap was several years ago She is still smoking, tried to quit to take OCP for her supposedly enlarging cyst but could not She is scheduled to have an [place] again to see if the cyst has gotten larger She states she does not want to go see Dr Nori Riis her ob/gyn anymore  She has had a workup for LUpus and wants to know if her CRP has gone down, she states her LUPUS workup was negative, she still has joint pain everywhere  She also has had weightloss and also hairloss, wants thyroid checked, normal thyroid in 2013.  She thinks all of her sxs came on when she started taking Lithium for her Bipolar   Last OV 01/11/14  Catherine Olsen is a 43 y.o. female who is here for Counseling about taking OCP and smoking.  She is here to see if she should take OCP to help with her spotting issues and her ovarian cyst/endomtrioma whichis present on [place] after undergoing a laparoscopic right ovarian cystectomy in 11/2013 by Dr Karel Jarvis. She had post op pain and vaginal bleeding after the surgery. She recently asked them to do a [place] and the cyst is still presnt but smaller. She had bleeding after the surgery. She occasionally still is bleeding, but more spotting. Her regular ob/gyn is Dr Nori Riis, who she has seen recommends that she be on JUnel Fe to help with decreasing the sixe of the cyst. She wants to know the risk and benefits sicne she does not want to have an oophrectomy.  EXAM:  TRANSABDOMINAL AND TRANSVAGINAL ULTRASOUND OF PELVIS  TECHNIQUE:    IMPRESSION:  1. 3.9 x 2.4 x 3.1 cm complex right ovarian cyst noted, reflecting a  smaller recurrent or residual right-sided hemorrhagic cyst or  endometrioma. This is significantly smaller than the 6.0 cm complex  cyst seen in March.  2. 1.5 cm anterior intramural fibroid again noted. Uterus otherwise  unremarkable in appearance.  Electronically Signed  By: Garald Balding M.D.  On: 01/08/2014 22:26  She is a 30 year 1 ppd smoker, she only smoked 1 cig today. She is also bipolar. Her psychiatrist thinsk the birth control may stabilize her mood or it can make it worse.     Past Medical History  Diagnosis Date  . Asthma   . Anxiety   . Mental disorder   . Bipolar 1 disorder   . Endometriosis   . PONV (postoperative nausea and vomiting)   . Termination of pregnancy     x 2 at age 76 and 43 yrs old  . Depression   . Headache(784.0)     otc med prn  . Arthritis     hands, lower back, knees  . Fibromyalgia   . Scoliosis   . Scoliosis    Past Surgical History  Procedure Laterality Date  . Nose surgery      rhinoplasty at age 23 yrs  . Wisdom tooth extraction    .  Facial cosmetic surgery      right cheek  . Laparoscopy Right 11/11/2013    Procedure: LAPAROSCOPY OPERATIVE with  Drainage of  RIGHT Ovarian ENDOMETRIOMA;  Surgeon: Elveria Royals, MD;  Location: [facility];  Service: Gynecology;  Laterality: Right;   History   Social History  . Marital Status: Single    Spouse Name: N/A    Number of Children: N/A  . Years of Education: N/A   Social History Main Topics  . Smoking status: Current Every Day Smoker -- 0.50 packs/day for 29 years    Types: Cigarettes, E-cigarettes  . Smokeless tobacco: Never Used  . Alcohol Use: No     Comment: "rare"  . 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 Sexual Activity: No     Comment: abortion at 6 and 46yrs. of age    Other Topics Concern  . None   Social History Narrative  . None   Family  History  Problem Relation Age of Onset  . Diabetes Mother   . Hypertension Mother    Allergies  Allergen Reactions  . Sulfa Antibiotics Hives  . Other Anxiety    steroids   Prior to Admission medications   Medication Sig Start Date End Date Taking? Authorizing Provider  albuterol (PROVENTIL HFA;VENTOLIN HFA) 108 (90 BASE) MCG/ACT inhaler Inhale 1-2 puffs into the lungs every 6 (six) hours as needed for wheezing or shortness of breath. 09/02/13  Yes  P , DO  clonazePAM (KLONOPIN) 2 MG tablet Take 2-4 mg by mouth at bedtime as needed (sleep).   Yes Historical Provider, MD  diphenhydrAMINE (BENADRYL) 25 MG tablet Take 50-100 mg by mouth at bedtime as needed for sleep.    Yes Historical Provider, MD  Magnesium 250 MG TABS Take 250 mg by mouth 2 (two) times a week.    Yes Historical Provider, MD  Multiple Vitamin (MULTIVITAMIN WITH MINERALS) TABS tablet Take 1 tablet by mouth daily.   Yes Historical Provider, MD  megestrol (MEGACE) 40 MG tablet Take 1 tablet (40 mg total) by mouth 2 (two) times daily. Take one tablet twice daily for 3 days, then one tablet daily 01/12/14   Seabron Spates, CNM  temazepam (RESTORIL) 15 MG capsule Take 60 mg by mouth at bedtime as needed for sleep.     Historical Provider, MD     ROS: The patient denies fevers, chills, night sweats, , chest pain, palpitations, wheezing, dyspnea on exertion, nausea, vomiting, abdominal pain, dysuria, hematuria, melena, numbness, weakness, or tingling. + hairloss, unintentional weight loss  All other systems have been reviewed and were otherwise negative with the exception of those mentioned in the HPI and as above.    PHYSICAL EXAM: Filed Vitals:   02/03/14 1034  BP: 90/60  Pulse: 70  Temp: 97.5 F (36.4 C)  Resp: 16   Filed Vitals:   02/03/14 1034  Height: $Remove'5\' 8"'DlUYaVi$  (1.727 m)  Weight: 130 lb 3.2 oz (59.058 kg)   Body mass index is 19.8 kg/(m^2).  General: Alert, no acute distress, thin  HEENT:  Normocephalic,  atraumatic, oropharynx patent. EOMI, PERRLA Cardiovascular:  Regular rate and rhythm, no rubs murmurs or gallops.  No Carotid bruits, radial pulse intact. No pedal edema.  Respiratory: Clear to auscultation bilaterally.  No wheezes, rales, or rhonchi.  No cyanosis, no use of accessory musculature GI: No organomegaly, abdomen is soft and non-tender, positive bowel sounds.  No masses. Skin:  No rashes. Neurologic: Facial musculature symmetric. Psychiatric: Patient is appropriate throughout our interaction. Lymphatic: No cervical lymphadenopathy Musculoskeletal: Gait intact.   LABS: Results for orders placed during the hospital encounter of 01/12/14  URINALYSIS, ROUTINE W REFX MICROSCOPIC      Result Value Ref Range   Color, Urine YELLOW  YELLOW   APPearance CLOUDY (*) CAR   Specific Gravity, Urine 1.010  1.005 - 1.030   pH 7.5  5.0 - 8.0   Glucose, UA NEGATIVE  NEGATIVE mg/dL   Hgb urine dipstick LARGE (*) NEGATIVE   Bilirubin Urine NEGATIVE  NEGATIVE   Ketones, ur NEGATIVE  NEGATIVE mg/dL   Protein, ur NEGATIVE  NEGATIVE mg/dL   Urobilinogen, UA 0.2  0.0 - 1.0 mg/dL   Nitrite NEGATIVE  NEGATIVE   Leukocytes, UA NEGATIVE  NEGATIVE  URINE MICROSCOPIC-ADD ON      Result Value Ref Range   Squamous Epithelial / LPF RARE  RARE   WBC, UA 0-2  <3 WBC/hpf   RBC / HPF 7-10  <3 RBC/hpf   Bacteria, UA RARE  RARE   Urine-Other AMORPHOUS URATES/PHOSPHATES       EKG/XRAY:   Primary read interpreted by Dr. Marin Comment at [facility].   ASSESSMENT/PLAN: Encounter Diagnoses  Name Primary?  . Screening for deficiency anemia Yes  . Screening for thyroid disorder   . Pain in joint, ankle and foot, unspecified laterality   . Dysfunctional uterine bleeding   . Family history of hyperlipidemia   . Hair loss   . Screening for STD (sexually transmitted disease)    43 y.o female who is here for a myriad of reasons but primarily to get labs done: CBC, CMP, hormones, ESR and also RPR pending. ( She  recently had GC and HIV and does not want that done)  F/u prn   Gross sideeffects, risk and benefits, and alternatives of medications d/w patient. Patient is aware that all medications have potential sideeffects and we are unable to predict every sideeffect or drug-drug interaction that may occur.  , [place], DO 02/03/2014 1:31 PM

## 2014-02-04 LAB — FSH/LH
FSH: 44.1 m[IU]/mL
LH: 22.8 m[IU]/mL

## 2014-02-04 LAB — RPR

## 2014-02-04 LAB — PROGESTERONE: Progesterone: 0.8 ng/mL

## 2014-02-05 NOTE — Progress Notes (Signed)
Quick Note:  Please let pt know that her cyst is approximately 6.8cm in size. If she is interested in surgical options, we can arrange an appt with one of the gyn surgeons. ______

## 2014-02-06 ENCOUNTER — Inpatient Hospital Stay (HOSPITAL_COMMUNITY)
Admission: AD | Admit: 2014-02-06 | Discharge: 2014-02-06 | Disposition: A | Discharge status: Home / Self Care | Payer: Medicare Other | Source: Ambulatory Visit | Attending: Obstetrics & Gynecology | Admitting: Obstetrics & Gynecology

## 2014-02-06 ENCOUNTER — Inpatient Hospital Stay (HOSPITAL_COMMUNITY): Payer: Medicare Other

## 2014-02-06 ENCOUNTER — Other Ambulatory Visit: Payer: Self-pay | Admitting: Radiology

## 2014-02-06 ENCOUNTER — Ambulatory Visit (HOSPITAL_COMMUNITY): Payer: Medicare Other

## 2014-02-06 ENCOUNTER — Telehealth: Payer: Self-pay

## 2014-02-06 ENCOUNTER — Encounter: Payer: Self-pay | Admitting: Family Medicine

## 2014-02-06 ENCOUNTER — Telehealth: Payer: Self-pay | Admitting: Radiology

## 2014-02-06 DIAGNOSIS — M2550 Pain in unspecified joint: Principal | ICD-10-CM

## 2014-02-06 DIAGNOSIS — N801 Endometriosis of ovary: Secondary | ICD-10-CM

## 2014-02-06 DIAGNOSIS — F17210 Nicotine dependence, cigarettes, uncomplicated: Secondary | ICD-10-CM | POA: Insufficient documentation

## 2014-02-06 DIAGNOSIS — N80129 Deep endometriosis of ovary, unspecified ovary: Secondary | ICD-10-CM

## 2014-02-06 DIAGNOSIS — D259 Leiomyoma of uterus, unspecified: Secondary | ICD-10-CM | POA: Diagnosis not present

## 2014-02-06 DIAGNOSIS — R109 Unspecified abdominal pain: Secondary | ICD-10-CM | POA: Diagnosis not present

## 2014-02-06 DIAGNOSIS — R1031 Right lower quadrant pain: Secondary | ICD-10-CM | POA: Diagnosis not present

## 2014-02-06 DIAGNOSIS — G8929 Other chronic pain: Secondary | ICD-10-CM

## 2014-02-06 LAB — URINALYSIS, ROUTINE W REFLEX MICROSCOPIC
BILIRUBIN URINE: NEGATIVE
Glucose, UA: NEGATIVE mg/dL
Hgb urine dipstick: NEGATIVE
KETONES UR: 15 mg/dL — AB
LEUKOCYTES UA: NEGATIVE
NITRITE: NEGATIVE
PROTEIN: NEGATIVE mg/dL
Specific Gravity, Urine: 1.025 (ref 1.005–1.030)
Urobilinogen, UA: 0.2 mg/dL (ref 0.0–1.0)
pH: 6 (ref 5.0–8.0)

## 2014-02-06 LAB — CBC WITH DIFFERENTIAL/PLATELET
BASOS PCT: 0 % (ref 0–1)
Basophils Absolute: 0 10*3/uL (ref 0.0–0.1)
EOS PCT: 4 % (ref 0–5)
Eosinophils Absolute: 0.3 10*3/uL (ref 0.0–0.7)
HCT: 40.1 % (ref 36.0–46.0)
Hemoglobin: 14.1 g/dL (ref 12.0–15.0)
Lymphocytes Relative: 25 % (ref 12–46)
Lymphs Abs: 2 10*3/uL (ref 0.7–4.0)
MCH: 33 pg (ref 26.0–34.0)
MCHC: 35.2 g/dL (ref 30.0–36.0)
MCV: 93.9 fL (ref 78.0–100.0)
MONOS PCT: 5 % (ref 3–12)
Monocytes Absolute: 0.4 10*3/uL (ref 0.1–1.0)
NEUTROS PCT: 66 % (ref 43–77)
Neutro Abs: 5.3 10*3/uL (ref 1.7–7.7)
Platelets: 188 10*3/uL (ref 150–400)
RBC: 4.27 MIL/uL (ref 3.87–5.11)
RDW: 13.8 % (ref 11.5–15.5)
WBC: 8 10*3/uL (ref 4.0–10.5)

## 2014-02-06 LAB — POCT PREGNANCY, URINE: Preg Test, Ur: NEGATIVE

## 2014-02-06 MED ORDER — HYDROCODONE-ACETAMINOPHEN 5-325 MG PO TABS
2.0000 | ORAL_TABLET | Freq: Once | ORAL | Status: AC
  Administered 2014-02-06: 2 via ORAL
  Filled 2014-02-06: qty 2

## 2014-02-06 MED ORDER — HYDROCODONE-ACETAMINOPHEN 5-325 MG PO TABS: 1.0000 | ORAL_TABLET | Freq: Four times a day (QID) | ORAL | Status: DC | PRN

## 2014-02-06 MED ORDER — OXYCODONE HCL 5 MG PO TABS
5.0000 mg | ORAL_TABLET | Freq: Once | ORAL | Status: DC
  Filled 2014-02-06: qty 1

## 2014-02-06 NOTE — Telephone Encounter (Signed)
Patient returned call. Informed her of results. Patient would like to meet with provider to discuss her options-- though would like to do the most conservative option. Informed patient I would have the front office staff call her with an appointment to discuss options with provider. Patient verbalized understanding and gratitude. No further questions or concerns. Message sent to admin pool to schedule and call patient with appointment.

## 2014-02-06 NOTE — MAU Note (Signed)
Pt states had surgery to drain 7cm ovarian endometrioma. Pain level has increased. Afraid ovary is going to get torsed. Spoke with call a nurse who told her to come to the ER for further eval.

## 2014-02-06 NOTE — MAU Provider Note (Signed)
History     CSN: [id number]  Arrival date and time: 02/06/14 1724   First Provider Initiated Contact with Patient 02/06/14 1846      Chief Complaint  Patient presents with  . Cyst   HPI   [name] is a 43 y.o. female G2P0020 who presents with ongoing right sided abdominal pain. The patient had an US done on 10/30 which showed a 6.8 cm complex cyst.  She is here because she states the pain has worsened. She was called by the clinic and notified of the results and offered an appointment in [place] to discuss options. The patient does not feel she can wait until then due to increased pain. She feels that today her pain is worse than what it has been. She feels that the pain started worsening last Thursday; at first the pain was controlled with tylenol, now it is not improved.   On 8/7 she had a Laparoscopic right ovarian cyst drainage done by Dr. Benjie Karvonen; she refused to go back to see her and Dr. Benjie Karvonen, per the patient has refused to see her again as a patient.   OB History    Gravida Para Term Preterm AB TAB SAB Ectopic Multiple Living   2    2 2     0      Past Medical History  Diagnosis Date  . Asthma   . Anxiety   . Mental disorder   . Bipolar 1 disorder   . Endometriosis   . PONV (postoperative nausea and vomiting)   . Termination of pregnancy     x 2 at age 87 and 43 yrs old  . Depression   . Headache(784.0)     otc med prn  . Arthritis     hands, lower back, knees  . Fibromyalgia   . Scoliosis   . Scoliosis     Past Surgical History  Procedure Laterality Date  . Nose surgery      rhinoplasty at age 80 yrs  . Wisdom tooth extraction    . Facial cosmetic surgery      right cheek  . Laparoscopy Right 11/11/2013    Procedure: LAPAROSCOPY OPERATIVE with  Drainage of  RIGHT Ovarian ENDOMETRIOMA;  Surgeon: Elveria Royals, MD;  Location: [facility];  Service: Gynecology;  Laterality: Right;  . Dilation and curettage of uterus      Family History  Problem Relation  Age of Onset  . Diabetes Mother   . Hypertension Mother     History  Substance Use Topics  . Smoking status: Current Every Day Smoker -- 0.50 packs/day for 29 years    Types: Cigarettes, E-cigarettes  . Smokeless tobacco: Never Used  . Alcohol Use: No     Comment: "rare"    Allergies:  Allergies  Allergen Reactions  . Sulfa Antibiotics Hives  . Other Anxiety    steroids    Prescriptions prior to admission  Medication Sig Dispense Refill Last Dose  . albuterol (PROVENTIL HFA;VENTOLIN HFA) 108 (90 BASE) MCG/ACT inhaler Inhale 1-2 puffs into the lungs every 6 (six) hours as needed for wheezing or shortness of breath. 1 Inhaler 2 Taking  . clonazePAM (KLONOPIN) 2 MG tablet Take 2-4 mg by mouth at bedtime as needed (sleep).   Taking  . diphenhydrAMINE (BENADRYL) 25 MG tablet Take 50-100 mg by mouth at bedtime as needed for sleep.    Taking  . Magnesium 250 MG TABS Take 250 mg by mouth 2 (two) times  a week.    Taking  . Multiple Vitamin (MULTIVITAMIN WITH MINERALS) TABS tablet Take 1 tablet by mouth daily.   Taking  . temazepam (RESTORIL) 15 MG capsule Take 60 mg by mouth at bedtime as needed for sleep.    Not Taking   Results for orders placed or performed during the hospital encounter of 02/06/14 (from the past 48 hour(s))  Urinalysis, Routine w reflex microscopic     Status: Abnormal   Collection Time: 02/06/14  5:45 PM  Result Value Ref Range   Color, Urine YELLOW YELLOW   APPearance HAZY (A) CLEAR   Specific Gravity, Urine 1.025 1.005 - 1.030   pH 6.0 5.0 - 8.0   Glucose, UA NEGATIVE NEGATIVE mg/dL   Hgb urine dipstick NEGATIVE NEGATIVE   Bilirubin Urine NEGATIVE NEGATIVE   Ketones, ur 15 (A) NEGATIVE mg/dL   Protein, ur NEGATIVE NEGATIVE mg/dL   Urobilinogen, UA 0.2 0.0 - 1.0 mg/dL   Nitrite NEGATIVE NEGATIVE   Leukocytes, UA NEGATIVE NEGATIVE    Comment: MICROSCOPIC NOT DONE ON URINES WITH NEGATIVE PROTEIN, BLOOD, LEUKOCYTES, NITRITE, OR GLUCOSE <1000 mg/dL.   Pregnancy, urine POC     Status: None   Collection Time: 02/06/14  6:04 PM  Result Value Ref Range   Preg Test, Ur NEGATIVE NEGATIVE    Comment:        THE SENSITIVITY OF THIS METHODOLOGY IS >24 mIU/mL   CBC with Differential     Status: None   Collection Time: 02/06/14  7:20 PM  Result Value Ref Range   WBC 8.0 4.0 - 10.5 K/uL   RBC 4.27 3.87 - 5.11 MIL/uL   Hemoglobin 14.1 12.0 - 15.0 g/dL   HCT 40.1 36.0 - 46.0 %   MCV 93.9 78.0 - 100.0 fL   MCH 33.0 26.0 - 34.0 pg   MCHC 35.2 30.0 - 36.0 g/dL   RDW 13.8 11.5 - 15.5 %   Platelets 188 150 - 400 K/uL   Neutrophils Relative % 66 43 - 77 %   Neutro Abs 5.3 1.7 - 7.7 K/uL   Lymphocytes Relative 25 12 - 46 %   Lymphs Abs 2.0 0.7 - 4.0 K/uL   Monocytes Relative 5 3 - 12 %   Monocytes Absolute 0.4 0.1 - 1.0 K/uL   Eosinophils Relative 4 0 - 5 %   Eosinophils Absolute 0.3 0.0 - 0.7 K/uL   Basophils Relative 0 0 - 1 %   Basophils Absolute 0.0 0.0 - 0.1 K/uL    Review of Systems  Constitutional: Positive for chills. Negative for fever.  Gastrointestinal: Positive for nausea and abdominal pain (right sided pain ).   Physical Exam   Blood pressure 119/82, pulse 93, temperature 97.6 F (36.4 C), resp. rate 18, height 5\' 8"  (1.727 m), weight 58.968 kg (130 lb), last menstrual period 12/18/2013.  Physical Exam  Constitutional: She appears well-developed and well-nourished.  Non-toxic appearance. She does not have a sickly appearance. She does not appear ill. No distress.  HENT:  Head: Normocephalic.  Eyes: Pupils are equal, round, and reactive to light.  Neck: Neck supple.  GI: Normal appearance. There is tenderness in the right lower quadrant, periumbilical area and suprapubic area. There is rigidity and guarding.  Very limited abdominal exam due to patients request.   Skin: She is not diaphoretic.   Results for orders placed or performed during the hospital encounter of 02/06/14 (from the past 24 hour(s))  Urinalysis,  Routine w reflex microscopic  Status: Abnormal   Collection Time: 02/06/14  5:45 PM  Result Value Ref Range   Color, Urine YELLOW YELLOW   APPearance HAZY (A) CLEAR   Specific Gravity, Urine 1.025 1.005 - 1.030   pH 6.0 5.0 - 8.0   Glucose, UA NEGATIVE NEGATIVE mg/dL   Hgb urine dipstick NEGATIVE NEGATIVE   Bilirubin Urine NEGATIVE NEGATIVE   Ketones, ur 15 (A) NEGATIVE mg/dL   Protein, ur NEGATIVE NEGATIVE mg/dL   Urobilinogen, UA 0.2 0.0 - 1.0 mg/dL   Nitrite NEGATIVE NEGATIVE   Leukocytes, UA NEGATIVE NEGATIVE  Pregnancy, urine POC     Status: None   Collection Time: 02/06/14  6:04 PM  Result Value Ref Range   Preg Test, Ur NEGATIVE NEGATIVE  CBC with Differential     Status: None   Collection Time: 02/06/14  7:20 PM  Result Value Ref Range   WBC 8.0 4.0 - 10.5 K/uL   RBC 4.27 3.87 - 5.11 MIL/uL   Hemoglobin 14.1 12.0 - 15.0 g/dL   HCT 40.1 36.0 - 46.0 %   MCV 93.9 78.0 - 100.0 fL   MCH 33.0 26.0 - 34.0 pg   MCHC 35.2 30.0 - 36.0 g/dL   RDW 13.8 11.5 - 15.5 %   Platelets 188 150 - 400 K/uL   Neutrophils Relative % 66 43 - 77 %   Neutro Abs 5.3 1.7 - 7.7 K/uL   Lymphocytes Relative 25 12 - 46 %   Lymphs Abs 2.0 0.7 - 4.0 K/uL   Monocytes Relative 5 3 - 12 %   Monocytes Absolute 0.4 0.1 - 1.0 K/uL   Eosinophils Relative 4 0 - 5 %   Eosinophils Absolute 0.3 0.0 - 0.7 K/uL   Basophils Relative 0 0 - 1 %   Basophils Absolute 0.0 0.0 - 0.1 K/uL   US Transvaginal Non-ob  02/06/2014   CLINICAL DATA:  Lower right abdominal pain. History of endometrioma. Evaluate for torsion.  EXAM: TRANSVAGINAL ULTRASOUND OF PELVIS  DOPPLER ULTRASOUND OF OVARIES  TECHNIQUE: Transvaginal ultrasound examination of the pelvis was performed including evaluation of the uterus, ovaries, adnexal regions, and pelvic cul-de-sac.  Color and duplex Doppler ultrasound was utilized to evaluate blood flow to the ovaries.  COMPARISON:  02/03/2014  FINDINGS: Uterus  Measurements: 7.7 x 3.5 x 5.2 cm.  Hypoechoic structure along the anterior uterus measures 1.4 x 0.7 x 1.2 cm. This is most compatible with a small uterine fibroid.  Endometrium  Thickness: 0.9 cm.  No focal abnormality visualized.  Right ovary  Measurements: 7.3 x 5.7 x 6.6 cm. Again noted is a complex cystic-type structure in the right ovary that measures 6.9 x 5.1 x 6.1 cm. This structure has internal echoes and may have layering hyperechoic fluid or material.  Left ovary  Measurements: Left ovary measures 2.9 x 1.9 x 1.5 cm. Normal appearance the left ovary with small follicles.  Pulsed Doppler evaluation demonstrates normal low-resistance arterial and venous waveforms in the left ovary. There is arterial flow in the right ovary.  IMPRESSION: No evidence for ovarian torsion.  Again noted is a large complex cystic structure in the right ovary. This structures has been present since at least 02/16/2013. Differential would include endometrioma versus complex cyst.  Small uterine fibroid.   Electronically Signed   By: Markus Daft M.D.   On: 02/06/2014 21:41   [place] Art/ven Flow Abd Pelv Doppler  02/06/2014   CLINICAL DATA:  Lower abdominal pain. History of an endometrioma. Evaluate for  ovarian torsion. Patient with right sided low pelvic and abdominal pain. Patient also has a history of a fibroid.  EXAM: TRANSVAGINAL ULTRASOUND OF PELVIS  DOPPLER ULTRASOUND OF OVARIES  TECHNIQUE: Transvaginal ultrasound examination of the pelvis was performed including evaluation of the uterus, ovaries, adnexal regions, and pelvic cul-de-sac.  Color and duplex Doppler ultrasound was utilized to evaluate blood flow to the ovaries.  COMPARISON:  02/03/2014.  FINDINGS: Uterus  Measurements: 7.7 cm x 3.5 cm x 5.2 cm. Small, mildly hypoechoic mural fibroid arises from the anterior upper uterine segment measuring 14 mm x 7 mm x 12 mm. No other uterine mass or lesion.  Endometrium  Thickness: 9 mm.  No focal abnormality visualized.  Right ovary  Measurements: 7.3 cm x 5.7 cm  x 6.6 cm. Complex cyst arises from the right ovary with internal echoes and a fluid fluid level. This is stable from the prior exam allowing for slight differences in measurement technique. Cyst measures 7 cm x 5.1 cm x 6.6 cm.  Left ovary  Measurements: 2.9 cm x 1.9 cm x 1.5 cm. Normal appearance/no adnexal mass.  Pulsed Doppler evaluation demonstrates normal low-resistance arterial and venous waveforms in both ovaries.  No pelvic free fluid.  IMPRESSION: 1. No evidence of ovarian torsion. 2. Stable appearance of the right ovarian complex cyst, either a hemorrhagic cyst or endometrioma. As noted previously, this cyst has enlarged from an ultrasound dated 01/08/2014. 3. No other ovarian or adnexal abnormality. Small stable uterine fibroid.   Electronically Signed   By: Lajean Manes M.D.   On: 02/06/2014 21:38   ] MAU Course  Procedures  None  MDM Discussed options of cystectomy, oophorectomy, and drainage of cyst. Patient became very upset and verbal stating that there is no option for removal of her ovary. I encouraged the patient the importance of speaking with GYN surgeon to discuss further options.  Vicodin 2 tabs given  2000 patient awaiting [place] and has attempted to leave to go outside smoke, pt informed that she cannot leave MAU while waiting for [place]. Report given to Marcille Buffy CNM who resumes care of the patient.  2150: Dr. Gala Romney in to see the patient. Discussed options at length. Will treat pain at this time while patient is awaiting appointment for surgical opinion.   Assessment and Plan   1. Endometrioma of ovary   2. Abdominal pain    Pain management options discussed vicodin for one week, and then FU in the clinic for a pain contract at that time.  Patient will seek surgical options through [facility] or [facility]  Follow-up Information    Follow up with Arloa Koh, MD.   Specialty:  Obstetrics and Gynecology   Contact information:   [address] [place] [address] [phone]      FU in the clinic on 11/9 @ 1:00 for pain contract  Delano Metz Rasch, NP 02/06/2014 8:13 PM

## 2014-02-06 NOTE — Telephone Encounter (Signed)
Dr Marin Comment, I missed the sed Rate on this patient on Friday. I have called the lab to add this on, but it is too late, must be done within 24 hours. Would you like me to have her come in for this, or wait until next visit? I apologize for my error. I have put in a future order for this. Please let me know how to proceed from here, thanks. Verla Bryngelson

## 2014-02-06 NOTE — Telephone Encounter (Signed)
-----   Message from Truett Mainland, DO sent at 02/05/2014  2:06 PM EST ----- Please let pt know that her cyst is approximately 6.8cm in size.  If she is interested in surgical options, we can arrange an appt with one of the gyn surgeons.

## 2014-02-06 NOTE — MAU Note (Signed)
Pt presents to MAU with complaints of pain on her right side and she has an appointment but has had an increase in pain and couldn't wait any longer to be evaluated

## 2014-02-06 NOTE — Telephone Encounter (Signed)
Attempted to contact patient. No answer. Left message stating we are calling with results, please call clinic.

## 2014-02-06 NOTE — MAU Note (Signed)
An After Visit Summary was printed and given to the patient. Pt verbalizes understanding.

## 2014-02-06 NOTE — Discharge Instructions (Signed)
Endometriosis Endometriosis is a condition in which the tissue that lines the uterus (endometrium) grows outside of its normal location. The tissue may grow in many locations close to the uterus, but it commonly grows on the ovaries, fallopian tubes, vagina, or bowel. Because the uterus expels, or sheds, its lining every menstrual cycle, there is bleeding wherever the endometrial tissue is located. This can cause pain because blood is irritating to tissues not normally exposed to it.  CAUSES  The cause of endometriosis is not known.  SIGNS AND SYMPTOMS  Often, there are no symptoms. When symptoms are present, they can vary with the location of the displaced tissue. Various symptoms can occur at different times. Although symptoms occur mainly during a woman's menstrual period, they can also occur midcycle and usually stop with menopause. Some people may go months with no symptoms at all. Symptoms may include:   Back or abdominal pain.   Heavier bleeding during periods.   Pain during intercourse.   Painful bowel movements.   Infertility. DIAGNOSIS  Your health care provider will do a physical exam and ask about your symptoms. Various tests may be done, such as:   Blood tests and urine tests. These are done to help rule out other problems.   Ultrasound. This test is done to look for abnormal tissue.   An X-ray of the lower bowel (barium enema).  Laparoscopy. In this procedure, a thin, lighted tube with a tiny camera on the end (laparoscope) is inserted into your abdomen. This helps your health care provider look for abnormal tissue to confirm the diagnosis. The health care provider may also remove a small piece of tissue (biopsy) from any abnormal tissue found. This tissue sample can then be sent to a lab so it can be looked at under a microscope. TREATMENT  Treatment will vary and may include:   Medicines to relieve pain. Nonsteroidal anti-inflammatory drugs (NSAIDs) are a type of  pain medicine that can help to relieve the pain caused by endometriosis.  Hormonal therapy. When using hormonal therapy, periods are eliminated. This eliminates the monthly exposure to blood by the displaced endometrial tissue.   Surgery. Surgery may sometimes be done to remove the abnormal endometrial tissue. In severe cases, surgery may be done to remove the fallopian tubes, uterus, and ovaries (hysterectomy). HOME CARE INSTRUCTIONS   Take all medicines as directed by your health care provider. Do not take aspirin because it may increase bleeding when you are not on hormonal therapy.   Avoid activities that produce pain, including sexual activity. SEEK MEDICAL CARE IF:  You have pelvic pain before, after, or during your periods.  You have pelvic pain between periods that gets worse during your period.  You have pelvic pain during or after sex.  You have pelvic pain with bowel movements or urination, especially during your period.  You have problems getting pregnant.  You have a fever. SEEK IMMEDIATE MEDICAL CARE IF:   Your pain is severe and is not responding to pain medicine.   You have severe nausea and vomiting, or you cannot keep foods down.   You have pain that is limited to the right lower part of your abdomen.   You have swelling or increasing pain in your abdomen.   You see blood in your stool.  MAKE SURE YOU:   Understand these instructions.  Will watch your condition.  Will get help right away if you are not doing well or get worse. Document Released: 03/21/2000 Document   Revised: 08/08/2013 Document Reviewed: 11/19/2012 Exit[employer] Patient Information 2015 [employer]. This information is not intended to replace advice given to you by your health care provider. Make sure you discuss any questions you have with your health care provider.  

## 2014-02-07 ENCOUNTER — Telehealth: Payer: Self-pay | Admitting: *Deleted

## 2014-02-07 LAB — C-REACTIVE PROTEIN: CRP: <0.5 mg/dL (ref <0.60)

## 2014-02-07 LAB — ESTROGENS, TOTAL: Estrogen: 150 pg/mL

## 2014-02-07 NOTE — Telephone Encounter (Signed)
Can you call [facility] and see if they can add a CRP to prior blood work  if not then call her and let her know she can come in for a repeat ESR. Otherwise we can do it at another visit when she comes in. Thanks Dr Marin Comment

## 2014-02-07 NOTE — Telephone Encounter (Addendum)
Earlier appointment made for 02/14/14 at 2:40 PM. Called patient. No answer. Left message informing patient of new appointment date and time. Advised she call clinic with any questions or concerns or if this appointment will not work for her. Per chart review, patient seen in MAU yesterday-- given pain meds and told to follow up in clinic next week for a pain contract (per Dr. Gala Romney). Also noted that patient was going to seek surgical consult elsewhere. Will keep appointment for now (as patient will need to sign pain contract) and wait until we hear from patient as to whether or not she would like to continue to be seen in clinic.

## 2014-02-07 NOTE — Telephone Encounter (Signed)
Pt called nurse line stating she had surgery in August for cyst and the cyst has returned.  She states she is in a lot of pain and is constantly taking Motrin.  Pt scheduled appointment for 03/20/14 with provider, but states she needs pain medication now.    Attempted to contact patient to inform her that she needs to be seen by a provider to evaluate pain, most likely in the MAU.  No answer, left message for patient to return call to clinic and may give permission to leave a detail message on secure voicemail.

## 2014-02-08 NOTE — Telephone Encounter (Signed)
Called left message regarding Sed rate not being performed, she will come in on Monday for this there will be no charge.

## 2014-02-08 NOTE — Telephone Encounter (Signed)
Test was added to original lab order. Faxing results today.

## 2014-02-09 ENCOUNTER — Telehealth: Payer: Self-pay

## 2014-02-09 NOTE — Telephone Encounter (Signed)
Dr.Shaw, Pt would like to have you order a MRI based on her lab results. [facility] # [phone]

## 2014-02-09 NOTE — Telephone Encounter (Signed)
LE- Pt called earlier & the message should go to Dr. Marin Comment.  She is convinced that she has a tumor after reading her MyChart lab results/combined with her symptoms.  She is really wanting a scan done asap and is pleading for a call back.  [phone]

## 2014-02-10 ENCOUNTER — Encounter: Payer: Self-pay | Admitting: Family Medicine

## 2014-02-10 ENCOUNTER — Telehealth: Payer: Self-pay | Admitting: Medical

## 2014-02-10 NOTE — Telephone Encounter (Signed)
Patient called MAU to discuss pain management and plan. Patient has large complex ovarian cyst. Patient has an appointment scheduled for [facility] for follow-up on Monday, November 9th. She states that she may cancel that appointment because she will be going to [facility] [address] for surgery instead as she desires an ovarian sparing surgery. Patient states that she has that appointment on 02/17/14 but was not given enough pain medication to last until that appointment. Patient has come to pick up additional Rx for Vicodin #30 given and advised that she can take Ibuprofen PRN for breakthrough pain as well. Patient will confirm with PCP that [place] appointment is not needed and will most likely cancel that appointment.   Luvenia Redden, PA-C 02/10/2014 3:59 PM

## 2014-02-10 NOTE — Telephone Encounter (Signed)
I had spoken to her about her labs and also she is requesting MRI for pituitary tumor.

## 2014-02-10 NOTE — Telephone Encounter (Signed)
Called pt and confirmed with her appt scheduled at the clinics for 02/14/14 @ 1440.  Pt stated "well I thought that I had an scheduled on 02/13/14 @ 1300 from MAU."  I called MAU to confirm appt scheduled on the MAU list.  Kerry Hough, PA confirmed pt was placed in the 02/13/14 @ 1300 slot.  I called pt and informed her that she should keep appt scheduled appt from MAU on 02/13/14 @ 1300 and that usually our front office will call with the appt.  Pt stated understanding with no further questions.

## 2014-02-10 NOTE — Telephone Encounter (Signed)
Pt has been emailing Dr. Marin Comment over [place]. She wanted to let Dr. Marin Comment know that her endocrinologist's office is closed until Monday and she will not be able to get her records until then. She also wants to discuss issues with the pain rx that the hospital prescribed her.

## 2014-02-11 ENCOUNTER — Emergency Department (INDEPENDENT_AMBULATORY_CARE_PROVIDER_SITE_OTHER)
Admission: EM | Admit: 2014-02-11 | Discharge: 2014-02-11 | Disposition: A | Discharge status: Home / Self Care | Payer: Medicare Other | Source: Home / Self Care | Attending: Family Medicine | Admitting: Family Medicine

## 2014-02-11 ENCOUNTER — Encounter (HOSPITAL_COMMUNITY): Payer: Self-pay | Admitting: *Deleted

## 2014-02-11 DIAGNOSIS — K0510 Chronic gingivitis, plaque induced: Secondary | ICD-10-CM | POA: Diagnosis not present

## 2014-02-11 MED ORDER — AMOXICILLIN 500 MG PO CAPS: ORAL_CAPSULE | ORAL | Status: DC

## 2014-02-11 MED ORDER — CHLORHEXIDINE GLUCONATE 0.12 % MT SOLN: 15.0000 mL | Freq: Two times a day (BID) | OROMUCOSAL | Status: DC

## 2014-02-11 NOTE — ED Notes (Signed)
C/O swollen, inflamed upper and lower gums.  States she is very depressed about having to go through another upcoming surgery, so she has not been brushing her teeth or bathing.  Denies any suicidal ideation - states she is seeing a psychiatrist.  Denies any pain or fevers.

## 2014-02-11 NOTE — ED Provider Notes (Signed)
CSN: [id number]     Arrival date & time 02/11/14  1736 History   First MD Initiated Contact with Patient 02/11/14 1753     Chief Complaint  Patient presents with  . Oral Swelling   (Consider location/radiation/quality/duration/timing/severity/associated sxs/prior Treatment) HPI Comments: As per nursing notes. 43 year old female complaining of gingival pain and swelling for several days. Due to depression of her upcoming surgeries and concern for pituitary tumor she has not been brushing her teeth or caring for herself as necessary   Past Medical History  Diagnosis Date  . Asthma   . Anxiety   . Mental disorder   . Bipolar 1 disorder   . Endometriosis   . PONV (postoperative nausea and vomiting)   . Termination of pregnancy     x 2 at age 35 and 43 yrs old  . Depression   . Headache(784.0)     otc med prn  . Arthritis     hands, lower back, knees  . Fibromyalgia   . Scoliosis   . Scoliosis    Past Surgical History  Procedure Laterality Date  . Nose surgery      rhinoplasty at age 33 yrs  . Wisdom tooth extraction    . Facial cosmetic surgery      right cheek  . Laparoscopy Right 11/11/2013    Procedure: LAPAROSCOPY OPERATIVE with  Drainage of  RIGHT Ovarian ENDOMETRIOMA;  Surgeon: Elveria Royals, MD;  Location: [facility];  Service: Gynecology;  Laterality: Right;  . Dilation and curettage of uterus     Family History  Problem Relation Age of Onset  . Diabetes Mother   . Hypertension Mother    History  Substance Use Topics  . Smoking status: Current Every Day Smoker -- 29 years    Types: Cigarettes, E-cigarettes  . Smokeless tobacco: Never Used  . Alcohol Use: No     Comment: "rare"   OB History    Gravida Para Term Preterm AB TAB SAB Ectopic Multiple Living   2    2 2     0     Review of Systems  HENT: Negative for dental problem, ear discharge and sore throat.   Respiratory: Negative.   Cardiovascular: Negative.   Gastrointestinal: Negative.    Musculoskeletal: Positive for neck pain.        Particularly with rotation of the head. Patient points to the base of the occiput on the left at insertion of the cervical muscles. No trauma.    Allergies  Sulfites; Other; Percocet; and Sulfa antibiotics  Home Medications   Prior to Admission medications   Medication Sig Start Date End Date Taking? Authorizing Provider  albuterol (PROVENTIL HFA;VENTOLIN HFA) 108 (90 BASE) MCG/ACT inhaler Inhale 1-2 puffs into the lungs every 6 (six) hours as needed for wheezing or shortness of breath. 09/02/13  Yes Thao P Le, DO  clonazePAM (KLONOPIN) 2 MG tablet Take 2-4 mg by mouth at bedtime as needed (sleep).   Yes Historical Provider, MD  HYDROcodone-acetaminophen (NORCO/VICODIN) 5-325 MG per tablet Take 1 tablet by mouth every 6 (six) hours as needed for moderate pain. 02/06/14  Yes Heather Erby Pian, CNM  Magnesium 250 MG TABS Take 250 mg by mouth daily.    Yes Historical Provider, MD  Multiple Vitamin (MULTIVITAMIN WITH MINERALS) TABS tablet Take 1 tablet by mouth daily.   Yes Historical Provider, MD  ACETYLCYSTEINE PO Take 1 tablet by mouth 3 (three) times a week.    Historical Provider, MD  amoxicillin (AMOXIL) 500 MG capsule 2 caps bid 02/11/14   Janne Napoleon, NP  chlorhexidine (PERIDEX) 0.12 % solution Use as directed 15 mLs in the mouth or throat 2 (two) times daily. 02/11/14   Janne Napoleon, NP  diphenhydrAMINE (BENADRYL) 25 MG tablet Take 50-100 mg by mouth at bedtime as needed for sleep.     Historical Provider, MD  temazepam (RESTORIL) 15 MG capsule Take 60 mg by mouth at bedtime as needed for sleep.     Historical Provider, MD   BP 107/75 mmHg  Pulse 103  Temp(Src) 98.3 F (36.8 C) (Oral)  Resp 18  SpO2 96%  LMP  (LMP Unknown) Physical Exam  Constitutional: She is oriented to person, place, and time. She appears well-developed. No distress.  HENT:  Mouth/Throat: Oropharynx is clear and moist. No oropharyngeal exudate.  Generalized upper  and lower gingival swelling and erythema. No evidence of abscess. No draining or bleeding.   Eyes: Conjunctivae and EOM are normal.  Neck: Normal range of motion. Neck supple.  Tenderness over the left lateral cervical musculature primarily at the base of the skull.  Cardiovascular: Normal rate and regular rhythm.   Pulmonary/Chest: Effort normal and breath sounds normal.  Lymphadenopathy:    She has no cervical adenopathy.  Neurological: She is alert and oriented to person, place, and time.  Skin: Skin is warm and dry.  Psychiatric: She has a normal mood and affect.  Nursing note and vitals reviewed.   ED Course  Procedures (including critical care time) Labs Review Labs Reviewed - No data to display  Imaging Review No results found.   MDM   1. Gingivitis     Chlorhexidine gluc rinses bid Amoxicillin Brush teeth, see dentist    Janne Napoleon, NP 02/11/14 1827

## 2014-02-11 NOTE — Discharge Instructions (Signed)
Gingivitis Gingivitis is a form of gum (periodontal) disease that causes redness, soreness, and swelling (inflammation) of your gums. CAUSES The most common cause of gingivitis is poor oral hygiene. A sticky substance made of bacteria, mucus, and food particles (plaque), is deposited on the exposed part of teeth. As plaque builds up, it reacts with the saliva in your mouth to form something called  tartar. Tartar is a hard deposit that becomes trapped around the base of the tooth. Plaque and tartar irritate the gums, leading to the formation of gingivitis. Other factors that increase your risk for gingivitis include:   Tobacco use.  Diabetes.  Older age.  Certain medications.  Certain viral or fungal infections.  Dry mouth.  Hormonal changes such as during pregnancy.  Poor nutrition.  Substance abuse.  Poor fitting dental restorations or appliances. SYMPTOMS You may notice inflammation of the soft tissue (gingiva) around the teeth. When these tissues become inflamed, they bleed easily, especially during flossing or brushing. The gums may also be:   Tender to the touch.  Bright red, purple red, or have a shiny appearance.  Swollen.  Wearing away from the teeth (receding), which exposes more of the tooth. Bad breath is often present. Continued infection around teeth can eventually cause cavities and loosen teeth. This may lead to eventual tooth loss. DIAGNOSIS A medical and dental history will be taken. Your mouth, teeth, and gums will be examined. Your dentist will look for soft, swollen purple-red, irritated gums. There may be deposits of plaque and tartar at the base of the teeth. Your gums will be looked at for the degree of redness, puffiness, and bleeding tendencies. Your dentist will see if any of the teeth are loose. X-rays may be taken to see if the inflammation has spread to the supporting structures of the teeth. TREATMENT The goal is to reduce and reverse the  inflammation. Proper treatment can usually reverse the symptoms of gingivitis and prevent further progression of the disease. Have your teeth cleaned. During the cleaning, all plaque and tartar will be removed. Instruction for proper home care will be given. You will need regular professional cleanings and check-ups in the future. HOME CARE INSTRUCTIONS  Brush your teeth twice a day and floss at least once per day. When flossing, it is best to floss first then brush.  Limit sugar between meals and maintain a well-balanced diet.  Even the best dental hygiene will not prevent plaque from developing. It is necessary for you to see your dentist on a regular basis for cleaning and regular checkups.  Your dentist can recommend proper oral hygiene and mouth care and suggest special toothpastes or mouth rinses.  Stop smoking. SEEK DENTAL OR MEDICAL CARE IF:  You have painful, reddened tissue around your teeth, or you have puffy swollen gums.  You have difficulty chewing.  You notice any loose or infected teeth.  You have swollen glands.  Your gums bleed easily when you brush your teeth or are very tender to the touch. Document Released: 09/17/2000 Document Revised: 06/16/2011 Document Reviewed: 06/28/2010 [facility] Patient Information 2015 [place], [place]. This information is not intended to replace advice given to you by your health care provider. Make sure you discuss any questions you have with your health care provider.  Gum Disease Gum disease is an infection of the tissues that surround and support the teeth (periodontium). This includes the gums, connective tissue fibers (ligaments), and the thickened ridges of the tooth bone (sockets). The disease is caused  by germs (bacteria) that grow in soft deposits (plaque) on the teeth. This results in redness, soreness, and swelling (inflammation). This inflammation causes the gums to bleed. If left untreated, it can lead to damage of the tissues  and supportive bone. Although bacteria are known as the major cause of gum disease, other risk factors include tobacco use, diabetes, certain medications, hormones, pregnancy, and genetic factors. SYMPTOMS   Gums that bleed easily.  Red or swollen gums.  Bad breath that does not go away.  Gums that have pulled away from the teeth.  Loose or separating permanent teeth.  Painful chewing.  Changes in the way your teeth fit together. DIAGNOSIS  A thorough exam will be performed by a dentist to determine the presence and stage of gum disease. The stage is how far the gum disease has developed. TREATMENT  Treatment is based on the stages of gum disease. The stages include:  Mild. If it is caught early, conditions can improve by brushing and flossing properly.  Moderate. You may need special cleaning (scaling and root planing). This method removes plaque and hardened plaque (tartar) above and below the gum line. Medication may also be used to treat moderate gum disease.  Severe. This stage requires surgery of the gums and supporting bone. PREVENTION  You can prevent gum disease by:  Practicing good oral hygiene, including brushing and flossing properly.  Avoiding use of tobacco products.  Scheduling regular dental check-ups and cleanings.  Eating a well-balanced diet. SEEK IMMEDIATE DENTAL OR MEDICAL CARE IF:  You have fever over 102 F (38.9 C).  You have swelling of your face, neck, or jaw.  You are unable to open your mouth.  You have severe pain not controlled by pain medicine. Document Released: 09/11/2009 Document Revised: 12/17/2011 Document Reviewed: 09/11/2009 [facility] Patient Information 2015 [place], [place]. This information is not intended to replace advice given to you by your health care provider. Make sure you discuss any questions you have with your health care provider.  Diet and Dental Disease What you eat affects the health of your teeth. Diet plays an  important role in developing healthy teeth and preventing dental disease, such as:  Tooth decay.  Gum (periodontal) disease.  Developmental defects of the enamel. This is when visible surfaces of the tooth do not form properly, leaving the tooth more prone to decay.  Dental erosion. This is when the teeth wear away. Knowing which foods promote strong teeth and which foods to stay away from can help you prevent poor oral health. If your diet lacks proper nutrients, it may be difficult for the tissues in your mouth to prevent dental disease. FOODS THAT PROMOTE DENTAL DISEASE The following foods either contain acids or create acid in your mouth that increases the risk of tooth decay:  Sugary foods, such as candy and baked goods (cookies, cake).  Soft drinks (carbonated and non-carbonated) such as soda, sports drinks, and fruit juice.  Citrus fruits, such as oranges and lemons.  Berries.  Honey.  Herbal teas that contain berries and other fruits.  Wines and other alcoholic beverages.  Vinegar or vinegar containing foods, such as pickles.  Starchy snacks such as crackers, potato chips, [place] fries, and pasta. Some of these foods have health benefits. Eat these foods in moderation. The more often you eat these foods, the more frequently you are exposing your teeth to the acid that causes dental diseases. FOODS THAT REDUCE THE RISK OF DENTAL DISEASE Certain foods help to keep  the teeth strong and reduce the risk of tooth decay. These foods include:  Dairy products, such as cow's milk and cheese. Eating dairy with a meal or sugary snack reduces the risk of tooth decay.  Gums and foods that substitute sugar with sorbitol, mannitol, and xylitol.  Fluoride containing foods, such as black tea. Fluoride is a natural mineral that protects the teeth from tooth decay. Your caregiver may recommend fluoride toothpaste or a fluoride supplement.  Breast milk. DIETARY RECOMMENDATIONS FOR HEALTHY  TEETH  Eat a healthy, well-balanced diet with fiber-rich fruits and vegetables and quality proteins (eggs, meat, poultry, and fish). A variety of foods each day in moderation is best.  Avoid frequent sugary snacks in between meals.  Avoid frequent sticky, chewy, sugary candies, such as gummy bears and other candies that stick to the teeth. Avoid sucking on candies for a long time.  Avoid drinks that contain added sugar. Even though they do not sit in the mouth for very long, they can promote tooth decay if consumed too frequently.  Avoid sugary foods and drinks late at night.  Avoid swishing or holding acidic or sugary drinks in your mouth. Using a straw limits contact with the teeth.  If you like frequent sugary treats, try eating a sugary dessert after a meal or with a dairy product, rather than eating it by itself.  Avoid starchy foods such as graham crackers that stick to your teeth.  Eat highly acidic and sugary foods in moderation, especially if you tend to develop tooth decay. Eat citrus fruits or drinks 2 times per day or less. Limit foods with vinegar and sports drinks to 1 time per week.  Try rinsing your mouth with water after a sugary or acidic meal or drink. Rinsing may help to reduce the acid buildup in the mouth.  Limit alcohol.  Read labels to determine the amount of sugar in foods. PRACTICE GOOD DAILY ORAL HYGIENE   Have your teeth professionally cleaned at the dentist every 6 months.  Brush twice daily with a fluoride toothpaste.  Floss between your teeth daily.  Ask your caregiver if you need fluoride supplements or treatments.  Ask your caregiver if you should have sealants applied to some of your teeth. HOME CARE INSTRUCTIONS  Follow the guidelines included here to promote good oral health.  Follow all of your caregiver's instructions for managing your health condition(s).  See your caregiver for follow-up exams as directed. Document Released:  11/20/2010 Document Revised: 06/16/2011 Document Reviewed: 11/20/2010 [facility] Patient Information 2015 [place], [place]. This information is not intended to replace advice given to you by your health care provider. Make sure you discuss any questions you have with your health care provider.

## 2014-02-14 ENCOUNTER — Other Ambulatory Visit: Payer: Self-pay | Admitting: Family Medicine

## 2014-02-14 ENCOUNTER — Encounter: Payer: Medicare Other | Admitting: Obstetrics & Gynecology

## 2014-02-14 DIAGNOSIS — R51 Headache: Principal | ICD-10-CM

## 2014-02-14 DIAGNOSIS — R519 Headache, unspecified: Secondary | ICD-10-CM

## 2014-02-17 DIAGNOSIS — N912 Amenorrhea, unspecified: Secondary | ICD-10-CM | POA: Diagnosis not present

## 2014-02-17 DIAGNOSIS — N809 Endometriosis, unspecified: Secondary | ICD-10-CM | POA: Diagnosis not present

## 2014-02-17 DIAGNOSIS — D250 Submucous leiomyoma of uterus: Secondary | ICD-10-CM | POA: Diagnosis not present

## 2014-02-24 ENCOUNTER — Encounter: Payer: Medicare Other | Admitting: Family Medicine

## 2014-02-28 DIAGNOSIS — F4312 Post-traumatic stress disorder, chronic: Secondary | ICD-10-CM | POA: Diagnosis not present

## 2014-02-28 DIAGNOSIS — F3132 Bipolar disorder, current episode depressed, moderate: Secondary | ICD-10-CM | POA: Diagnosis not present

## 2014-03-04 ENCOUNTER — Encounter (HOSPITAL_COMMUNITY): Payer: Self-pay | Admitting: *Deleted

## 2014-03-04 ENCOUNTER — Inpatient Hospital Stay (HOSPITAL_COMMUNITY)
Admission: AD | Admit: 2014-03-04 | Discharge: 2014-03-04 | Disposition: A | Discharge status: Home / Self Care | Payer: Medicare Other | Source: Ambulatory Visit | Attending: Obstetrics & Gynecology | Admitting: Obstetrics & Gynecology

## 2014-03-04 DIAGNOSIS — N801 Endometriosis of ovary: Secondary | ICD-10-CM | POA: Diagnosis not present

## 2014-03-04 DIAGNOSIS — N80129 Deep endometriosis of ovary, unspecified ovary: Secondary | ICD-10-CM

## 2014-03-04 MED ORDER — HYDROCODONE-ACETAMINOPHEN 5-325 MG PO TABS
1.0000 | ORAL_TABLET | Freq: Once | ORAL | Status: AC
Start: 2014-03-04 — End: 2014-03-04
  Administered 2014-03-04: 1 via ORAL
  Filled 2014-03-04: qty 1

## 2014-03-04 MED ORDER — HYDROCODONE-ACETAMINOPHEN 5-325 MG PO TABS: 1.0000 | ORAL_TABLET | Freq: Four times a day (QID) | ORAL | Status: DC | PRN

## 2014-03-04 MED ORDER — IBUPROFEN 600 MG PO TABS: 600.0000 mg | ORAL_TABLET | Freq: Four times a day (QID) | ORAL | Status: DC | PRN

## 2014-03-04 NOTE — MAU Note (Signed)
Patient states she is still unable to give a voided specimen.

## 2014-03-04 NOTE — MAU Note (Signed)
Unable to void on arrival; informed of need to obtain a specimen for labs.

## 2014-03-04 NOTE — MAU Note (Signed)
Patient presents with complaint of dizziness, vomiting and abdominal pain due to an ovarian endometrioma.

## 2014-03-04 NOTE — MAU Provider Note (Signed)
CC: Dizziness; Emesis; and Abdominal Pain    First Provider Initiated Contact with Patient 03/04/14 1856      HPI Catherine Olsen is a 43 y.o. G2P0020 who has been followed for right ovarian complex cyst/endometrioma presents with increased abdominal pain since out of Vicodin for the past few days. She describes sharp "clinching pain" throughout right abdomen that is constantly present but waxes and wanes. She also has felt dizzy especially when walking and vomited x 1 today. Denies nausea now. Appetite is  good and retained solids since. Taking ibuprofen with partial relief.  She had cyst drained by Dr. Benjie Karvonen 11/11/2013 but is no longer seen at that practice. She [facility] at [facility] 01/18/14, but did go to [facility], commissioning at [facility] about 2 wks ago. States she had labs and [place] and was told endometrioma was the same size, She was given Vicodin to take 1 1/2 tabs q 6 h and was taking 1 every 6 hrs until she ran out. (Before that, was also given Rx for Vicodin 5 mg 30 tabs from MAU provider on 02/06/14). Last CMP and CBC here were normal 4 wks ago. She is scheduled for surgery 04/12/14 at [facility].  LMP 02/24/14. Pain usually more severe during and after menses.  Denies abnormal bleeding. Last intercourse April. Denies illicit drug use.  Past Medical History  Diagnosis Date  . Asthma   . Anxiety   . Mental disorder   . Bipolar 1 disorder   . Endometriosis   . PONV (postoperative nausea and vomiting)   . Termination of pregnancy     x 2 at age 25 and 43 yrs old  . Depression   . Headache(784.0)     otc med prn  . Arthritis     hands, lower back, knees  . Fibromyalgia   . Scoliosis   . Scoliosis     OB History  Gravida Para Term Preterm AB SAB TAB Ectopic Multiple Living  2    2  2    0    # Outcome Date GA Lbr Len/2nd Weight Sex Delivery Anes PTL Lv  2 TAB           1 TAB               Past Surgical History  Procedure Laterality Date  . Nose surgery      rhinoplasty at age 30 yrs  . Wisdom tooth  extraction    . Facial cosmetic surgery      right cheek  . Laparoscopy Right 11/11/2013    Procedure: LAPAROSCOPY OPERATIVE with  Drainage of  RIGHT Ovarian ENDOMETRIOMA;  Surgeon: Elveria Royals, MD;  Location: [facility];  Service: Gynecology;  Laterality: Right;  . Dilation and curettage of uterus      History   Social History  . Marital Status: Divorced    Spouse Name: N/A    Number of Children: N/A  . Years of Education: N/A   Occupational History  . Not on file.   Social History Main Topics  . Smoking status: Current Every Day Smoker -- 29 years    Types: Cigarettes, E-cigarettes  . Smokeless tobacco: Never Used  . Alcohol Use: No     Comment: "rare"  . 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 Sexual Activity: No     Comment: abortion at 43 and 54yrs. of age    Other Topics Concern  .  Not on file   Social History Narrative    No current facility-administered medications on file prior to encounter.   Current Outpatient Prescriptions on File Prior to Encounter  Medication Sig Dispense Refill  . ACETYLCYSTEINE PO Take 1 tablet by mouth 3 (three) times a week.    Marland Kitchen albuterol (PROVENTIL HFA;VENTOLIN HFA) 108 (90 BASE) MCG/ACT inhaler Inhale 1-2 puffs into the lungs every 6 (six) hours as needed for wheezing or shortness of breath. 1 Inhaler 2  . amoxicillin (AMOXIL) 500 MG capsule 2 caps bid 28 capsule 0  . chlorhexidine (PERIDEX) 0.12 % solution Use as directed 15 mLs in the mouth or throat 2 (two) times daily. 473 mL 0  . clonazePAM (KLONOPIN) 2 MG tablet Take 2-4 mg by mouth at bedtime as needed (sleep).    . diphenhydrAMINE (BENADRYL) 25 MG tablet Take 50-100 mg by mouth at bedtime as needed for sleep.     Marland Kitchen HYDROcodone-acetaminophen (NORCO/VICODIN) 5-325 MG per tablet Take 1 tablet by mouth every 6 (six) hours as needed for moderate pain. 35 tablet 0  . Magnesium 250 MG TABS Take 250 mg by mouth daily.     . Multiple Vitamin  (MULTIVITAMIN WITH MINERALS) TABS tablet Take 1 tablet by mouth daily.    . temazepam (RESTORIL) 15 MG capsule Take 60 mg by mouth at bedtime as needed for sleep.       Allergies  Allergen Reactions  . Sulfites   . Other Anxiety    steroids  . Percocet [Oxycodone-Acetaminophen] Palpitations  . Sulfa Antibiotics Hives    ROS Pertinent items in HPI. No diarrhea or constipation. Voiding qs.   PHYSICAL EXAM Filed Vitals:   03/04/14 1913  BP: 117/85  Pulse: 100  Temp:   Resp:    Filed Vitals:   03/04/14 1841 03/04/14 1910 03/04/14 1912 03/04/14 1913  BP: 127/82 123/82 123/83 117/85  Pulse: 96 93 91 100  Temp: 98.1 F (36.7 C)     TempSrc: Oral     Resp: 16     Height: 5\' 8"  (1.727 m)     Weight: 60.952 kg (134 lb 6 oz)      General: Well nourished, well developed female in no acute distress Cardiovascular: Normal rate Respiratory: Normal effort Abdomen: Soft, diffusely tender RUQ and RLQ with voluntary guarding, no rebound Back: No CVAT Extremities: No edema Neurologic: Alert and oriented Bimanual exam: cervix closed, no CMT; uterus NSSP; thickening and tenderness in right adnexa difficult to assess due to tensing of rectus muscle; left adnexa NT   LAB RESULTS No results found for this or any previous visit (from the past 24 hour(s)).  IMAGING US Transvaginal Non-ob  02/06/2014   CLINICAL DATA:  Lower right abdominal pain. History of endometrioma. Evaluate for torsion.  EXAM: TRANSVAGINAL ULTRASOUND OF PELVIS  DOPPLER ULTRASOUND OF OVARIES  TECHNIQUE: Transvaginal ultrasound examination of the pelvis was performed including evaluation of the uterus, ovaries, adnexal regions, and pelvic cul-de-sac.  Color and duplex Doppler ultrasound was utilized to evaluate blood flow to the ovaries.  COMPARISON:  02/03/2014  FINDINGS: Uterus  Measurements: 7.7 x 3.5 x 5.2 cm. Hypoechoic structure along the anterior uterus measures 1.4 x 0.7 x 1.2 cm. This is most compatible with a small  uterine fibroid.  Endometrium  Thickness: 0.9 cm.  No focal abnormality visualized.  Right ovary  Measurements: 7.3 x 5.7 x 6.6 cm. Again noted is a complex cystic-type structure in the right ovary that measures 6.9 x 5.1  x 6.1 cm. This structure has internal echoes and may have layering hyperechoic fluid or material.  Left ovary  Measurements: Left ovary measures 2.9 x 1.9 x 1.5 cm. Normal appearance the left ovary with small follicles.  Pulsed Doppler evaluation demonstrates normal low-resistance arterial and venous waveforms in the left ovary. There is arterial flow in the right ovary.  IMPRESSION: No evidence for ovarian torsion.  Again noted is a large complex cystic structure in the right ovary. This structures has been present since at least 02/16/2013. Differential would include endometrioma versus complex cyst.  Small uterine fibroid.   Electronically Signed   By: Markus Daft M.D.   On: 02/06/2014 21:41   US Transvaginal Non-ob  02/03/2014   CLINICAL DATA:  Follow-up endometrioma, status post laparoscopic drainage on 11/11/2013  EXAM: ULTRASOUND PELVIS TRANSVAGINAL  TECHNIQUE: Transvaginal ultrasound examination of the pelvis was performed including evaluation of the uterus, ovaries, adnexal regions, and pelvic cul-de-sac.  COMPARISON:  01/08/2014  FINDINGS: Uterus  Measurements: 8.1 x 3.4 x 5.1 cm. 1.4 x 0.6 x 1.3 cm subserosal anterior fundal fibroid.  Endometrium  Thickness: 8 mm.  No focal abnormality visualized.  Right ovary  Measurements: 7.6 x 6.1 x 7.2 cm. 6.8 x 4.7 x 5.9 cm complex right ovarian cyst/endometrioma, previously 3.9 x 2.4 x 3.1 cm.  Left ovary  Measurements: 3.1 x 2.4 x 2.1 cm. Normal appearance/no adnexal mass.  Other findings:  No free fluid  IMPRESSION: 6.8 cm complex right ovarian cyst/endometrioma, previously 3.9 cm.   Electronically Signed   By: Julian Hy M.D.   On: 02/03/2014 16:16   [place] Art/ven Flow Abd Pelv Doppler  02/08/2014   ADDENDUM REPORT: 02/08/2014 13:16   ADDENDUM: Two separate reports were issued for the pelvic ultrasound and Doppler imaging. Pulsed Doppler analysis of blood flow to the right ovary primarily demonstrates arterial waveforms, but venous flow is also evident within the waveforms. There is normal blood flow to the right ovary tissue surrounding the presumed endometrioma.   Electronically Signed   By: Lajean Manes M.D.   On: 02/08/2014 13:16   02/08/2014   CLINICAL DATA:  Lower abdominal pain. History of an endometrioma. Evaluate for ovarian torsion. Patient with right sided low pelvic and abdominal pain. Patient also has a history of a fibroid.  EXAM: TRANSVAGINAL ULTRASOUND OF PELVIS  DOPPLER ULTRASOUND OF OVARIES  TECHNIQUE: Transvaginal ultrasound examination of the pelvis was performed including evaluation of the uterus, ovaries, adnexal regions, and pelvic cul-de-sac.  Color and duplex Doppler ultrasound was utilized to evaluate blood flow to the ovaries.  COMPARISON:  02/03/2014.  FINDINGS: Uterus  Measurements: 7.7 cm x 3.5 cm x 5.2 cm. Small, mildly hypoechoic mural fibroid arises from the anterior upper uterine segment measuring 14 mm x 7 mm x 12 mm. No other uterine mass or lesion.  Endometrium  Thickness: 9 mm.  No focal abnormality visualized.  Right ovary  Measurements: 7.3 cm x 5.7 cm x 6.6 cm. Complex cyst arises from the right ovary with internal echoes and a fluid fluid level. This is stable from the prior exam allowing for slight differences in measurement technique. Cyst measures 7 cm x 5.1 cm x 6.6 cm.  Left ovary  Measurements: 2.9 cm x 1.9 cm x 1.5 cm. Normal appearance/no adnexal mass.  Pulsed Doppler evaluation demonstrates normal low-resistance arterial and venous waveforms in both ovaries.  No pelvic free fluid.  IMPRESSION: 1. No evidence of ovarian torsion. 2. Stable appearance of the right  ovarian complex cyst, either a hemorrhagic cyst or endometrioma. As noted previously, this cyst has enlarged from an ultrasound dated  01/08/2014. 3. No other ovarian or adnexal abnormality. Small stable uterine fibroid.  Electronically Signed: By: Lajean Manes M.D. On: 02/06/2014 21:38    MAU COURSE Vicodin 5 mg given Discussed with Dr. Harolyn Rutherford Ordered UA and UDS but after 2 hours in MAU left without giving specimen  ASSESSMENT  1. Endometrioma of ovary     PLAN Discharge home. See AVS for patient education.    Medication List    TAKE these medications        ACETYLCYSTEINE PO  Take 1 tablet by mouth 3 (three) times a week.     albuterol 108 (90 BASE) MCG/ACT inhaler  Commonly known as:  PROVENTIL HFA;VENTOLIN HFA  Inhale 1-2 puffs into the lungs every 6 (six) hours as needed for wheezing or shortness of breath.     amoxicillin 500 MG capsule  Commonly known as:  AMOXIL  2 caps bid     chlorhexidine 0.12 % solution  Commonly known as:  PERIDEX  Use as directed 15 mLs in the mouth or throat 2 (two) times daily.     clonazePAM 2 MG tablet  Commonly known as:  KLONOPIN  Take 2-4 mg by mouth at bedtime as needed (sleep).     diphenhydrAMINE 25 MG tablet  Commonly known as:  BENADRYL  Take 50-100 mg by mouth at bedtime as needed for sleep.     HYDROcodone-acetaminophen 5-325 MG per tablet  Commonly known as:  NORCO/VICODIN  Take 1 tablet by mouth every 6 (six) hours as needed for moderate pain.     ibuprofen 600 MG tablet  Commonly known as:  ADVIL,MOTRIN  Take 1 tablet (600 mg total) by mouth every 6 (six) hours as needed.     Magnesium 250 MG Tabs  Take 250 mg by mouth daily.     multivitamin with minerals Tabs tablet  Take 1 tablet by mouth daily.     temazepam 15 MG capsule  Commonly known as:  RESTORIL  Take 60 mg by mouth at bedtime as needed for sleep.        Follow-up Information    Follow up On 04/12/2014.   Why:  Keep your scheduled appointment at [place], [place] 03/04/2014 7:15 PM

## 2014-03-04 NOTE — Discharge Instructions (Signed)
Ovarian Cyst An ovarian cyst is a fluid-filled sac that forms on an ovary. The ovaries are small organs that produce eggs in women. Various types of cysts can form on the ovaries. Most are not cancerous. Many do not cause problems, and they often go away on their own. Some may cause symptoms and require treatment. Common types of ovarian cysts include:  Functional cysts--These cysts may occur every month during the menstrual cycle. This is normal. The cysts usually go away with the next menstrual cycle if the woman does not get pregnant. Usually, there are no symptoms with a functional cyst.  Endometrioma cysts--These cysts form from the tissue that lines the uterus. They are also called "chocolate cysts" because they become filled with blood that turns brown. This type of cyst can cause pain in the lower abdomen during intercourse and with your menstrual period.  Cystadenoma cysts--This type develops from the cells on the outside of the ovary. These cysts can get very big and cause lower abdomen pain and pain with intercourse. This type of cyst can twist on itself, cut off its blood supply, and cause severe pain. It can also easily rupture and cause a lot of pain.  Dermoid cysts--This type of cyst is sometimes found in both ovaries. These cysts may contain different kinds of body tissue, such as skin, teeth, hair, or cartilage. They usually do not cause symptoms unless they get very big.  Theca lutein cysts--These cysts occur when too much of a certain hormone (human chorionic gonadotropin) is produced and overstimulates the ovaries to produce an egg. This is most common after procedures used to assist with the conception of a baby (in vitro fertilization). CAUSES   Fertility drugs can cause a condition in which multiple large cysts are formed on the ovaries. This is called ovarian hyperstimulation syndrome.  A condition called polycystic ovary syndrome can cause hormonal imbalances that can lead to  nonfunctional ovarian cysts. SIGNS AND SYMPTOMS  Many ovarian cysts do not cause symptoms. If symptoms are present, they may include:  Pelvic pain or pressure.  Pain in the lower abdomen.  Pain during sexual intercourse.  Increasing girth (swelling) of the abdomen.  Abnormal menstrual periods.  Increasing pain with menstrual periods.  Stopping having menstrual periods without being pregnant. DIAGNOSIS  These cysts are commonly found during a routine or annual pelvic exam. Tests may be ordered to find out more about the cyst. These tests may include:  Ultrasound.  X-ray of the pelvis.  CT scan.  MRI.  Blood tests. TREATMENT  Many ovarian cysts go away on their own without treatment. Your health care provider may want to check your cyst regularly for 2-3 months to see if it changes. For women in menopause, it is particularly important to monitor a cyst closely because of the higher rate of ovarian cancer in menopausal women. When treatment is needed, it may include any of the following:  A procedure to drain the cyst (aspiration). This may be done using a long needle and ultrasound. It can also be done through a laparoscopic procedure. This involves using a thin, lighted tube with a tiny camera on the end (laparoscope) inserted through a small incision.  Surgery to remove the whole cyst. This may be done using laparoscopic surgery or an open surgery involving a larger incision in the lower abdomen.  Hormone treatment or birth control pills. These methods are sometimes used to help dissolve a cyst. HOME CARE INSTRUCTIONS   Only take over-the-counter   or prescription medicines as directed by your health care provider.  Follow up with your health care provider as directed.  Get regular pelvic exams and Pap tests. SEEK MEDICAL CARE IF:   Your periods are late, irregular, or painful, or they stop.  Your pelvic pain or abdominal pain does not go away.  Your abdomen becomes  larger or swollen.  You have pressure on your bladder or trouble emptying your bladder completely.  You have pain during sexual intercourse.  You have feelings of fullness, pressure, or discomfort in your stomach.  You lose weight for no apparent reason.  You feel generally ill.  You become constipated.  You lose your appetite.  You develop acne.  You have an increase in body and facial hair.  You are gaining weight, without changing your exercise and eating habits.  You think you are pregnant. SEEK IMMEDIATE MEDICAL CARE IF:   You have increasing abdominal pain.  You feel sick to your stomach (nauseous), and you throw up (vomit).  You develop a fever that comes on suddenly.  You have abdominal pain during a bowel movement.  Your menstrual periods become heavier than usual. MAKE SURE YOU:  Understand these instructions.  Will watch your condition.  Will get help right away if you are not doing well or get worse. Document Released: 03/24/2005 Document Revised: 03/29/2013 Document Reviewed: 11/29/2012 Exit[employer] Patient Information 2015 [employer]. This information is not intended to replace advice given to you by your health care provider. Make sure you discuss any questions you have with your health care provider.  

## 2014-03-13 ENCOUNTER — Inpatient Hospital Stay: Admission: RE | Admit: 2014-03-13 | Payer: Medicare Other | Source: Ambulatory Visit

## 2014-03-20 ENCOUNTER — Encounter: Payer: Medicare Other | Admitting: Obstetrics & Gynecology

## 2014-03-22 ENCOUNTER — Inpatient Hospital Stay: Admission: RE | Admit: 2014-03-22 | Payer: Medicare Other | Source: Ambulatory Visit

## 2014-03-24 ENCOUNTER — Ambulatory Visit (INDEPENDENT_AMBULATORY_CARE_PROVIDER_SITE_OTHER): Payer: Medicare Other | Admitting: Family Medicine

## 2014-03-24 ENCOUNTER — Encounter: Payer: Self-pay | Admitting: Family Medicine

## 2014-03-24 DIAGNOSIS — Z0000 Encounter for general adult medical examination without abnormal findings: Secondary | ICD-10-CM

## 2014-03-24 NOTE — Progress Notes (Signed)
   Subjective:    Patient ID: Catherine Olsen, female    DOB: January 06, 1971, 43 y.o.   MRN: [id number]  HPI    Review of Systems  Constitutional: Positive for activity change, appetite change and unexpected weight change.  HENT: Positive for dental problem, facial swelling and trouble swallowing.   Eyes: Positive for visual disturbance.  Respiratory: Positive for wheezing.   Cardiovascular: Negative.   Gastrointestinal: Negative.   Endocrine: Positive for cold intolerance.  Genitourinary: Positive for flank pain, vaginal bleeding, menstrual problem, pelvic pain and dyspareunia.  Musculoskeletal: Positive for back pain, joint swelling and arthralgias.  Skin: Negative.   Allergic/Immunologic: Negative.   Neurological: Negative.   Hematological: Positive for adenopathy.  Psychiatric/Behavioral: Positive for sleep disturbance, dysphoric mood and decreased concentration. The patient is nervous/anxious and is hyperactive.        Objective:   Physical Exam        Assessment & Plan:  NO SHOW

## 2014-03-27 DIAGNOSIS — F4312 Post-traumatic stress disorder, chronic: Secondary | ICD-10-CM | POA: Diagnosis not present

## 2014-03-27 DIAGNOSIS — F3132 Bipolar disorder, current episode depressed, moderate: Secondary | ICD-10-CM | POA: Diagnosis not present

## 2014-04-03 DIAGNOSIS — N801 Endometriosis of ovary: Secondary | ICD-10-CM | POA: Insufficient documentation

## 2014-04-03 DIAGNOSIS — M791 Myalgia: Secondary | ICD-10-CM | POA: Diagnosis not present

## 2014-04-03 DIAGNOSIS — N80109 Endometriosis of ovary, unspecified side, unspecified depth: Secondary | ICD-10-CM | POA: Insufficient documentation

## 2014-04-03 DIAGNOSIS — N83201 Unspecified ovarian cyst, right side: Secondary | ICD-10-CM | POA: Insufficient documentation

## 2014-04-03 DIAGNOSIS — M609 Myositis, unspecified: Secondary | ICD-10-CM | POA: Diagnosis not present

## 2014-04-03 DIAGNOSIS — N8320 Unspecified ovarian cysts: Secondary | ICD-10-CM | POA: Diagnosis not present

## 2014-04-05 ENCOUNTER — Ambulatory Visit
Admission: RE | Admit: 2014-04-05 | Discharge: 2014-04-05 | Disposition: A | Discharge status: Home / Self Care | Payer: Medicare Other | Source: Ambulatory Visit | Attending: Family Medicine | Admitting: Family Medicine

## 2014-04-05 DIAGNOSIS — R519 Headache, unspecified: Secondary | ICD-10-CM

## 2014-04-05 DIAGNOSIS — D352 Benign neoplasm of pituitary gland: Secondary | ICD-10-CM | POA: Diagnosis not present

## 2014-04-05 DIAGNOSIS — R51 Headache: Principal | ICD-10-CM

## 2014-04-05 MED ORDER — GADOBENATE DIMEGLUMINE 529 MG/ML IV SOLN
6.0000 mL | Freq: Once | INTRAVENOUS | Status: AC | PRN
  Administered 2014-04-05: 6 mL via INTRAVENOUS

## 2014-04-11 ENCOUNTER — Telehealth: Payer: Self-pay

## 2014-04-11 NOTE — Telephone Encounter (Signed)
Pt had an MRI on 04/05/14, pt is very frustrated because she has not been notified of the results Please call pt to advise of MRI results

## 2014-04-11 NOTE — Telephone Encounter (Signed)
Please advise results to report to pt

## 2014-04-12 ENCOUNTER — Other Ambulatory Visit: Payer: Self-pay | Admitting: Family Medicine

## 2014-04-12 ENCOUNTER — Emergency Department (HOSPITAL_COMMUNITY)
Admission: EM | Admit: 2014-04-12 | Discharge: 2014-04-12 | Disposition: A | Discharge status: Home / Self Care | Payer: Medicare Other | Attending: Emergency Medicine | Admitting: Emergency Medicine

## 2014-04-12 ENCOUNTER — Encounter (HOSPITAL_COMMUNITY): Payer: Self-pay | Admitting: Emergency Medicine

## 2014-04-12 DIAGNOSIS — Z8742 Personal history of other diseases of the female genital tract: Secondary | ICD-10-CM | POA: Diagnosis not present

## 2014-04-12 DIAGNOSIS — J45909 Unspecified asthma, uncomplicated: Secondary | ICD-10-CM | POA: Diagnosis not present

## 2014-04-12 DIAGNOSIS — F315 Bipolar disorder, current episode depressed, severe, with psychotic features: Secondary | ICD-10-CM | POA: Diagnosis not present

## 2014-04-12 DIAGNOSIS — Z791 Long term (current) use of non-steroidal anti-inflammatories (NSAID): Secondary | ICD-10-CM | POA: Insufficient documentation

## 2014-04-12 DIAGNOSIS — D352 Benign neoplasm of pituitary gland: Secondary | ICD-10-CM

## 2014-04-12 DIAGNOSIS — Z792 Long term (current) use of antibiotics: Secondary | ICD-10-CM | POA: Diagnosis not present

## 2014-04-12 DIAGNOSIS — M25462 Effusion, left knee: Secondary | ICD-10-CM | POA: Diagnosis not present

## 2014-04-12 DIAGNOSIS — F4312 Post-traumatic stress disorder, chronic: Secondary | ICD-10-CM | POA: Diagnosis not present

## 2014-04-12 DIAGNOSIS — Z79899 Other long term (current) drug therapy: Secondary | ICD-10-CM | POA: Insufficient documentation

## 2014-04-12 DIAGNOSIS — M25461 Effusion, right knee: Secondary | ICD-10-CM | POA: Diagnosis not present

## 2014-04-12 DIAGNOSIS — F419 Anxiety disorder, unspecified: Secondary | ICD-10-CM | POA: Insufficient documentation

## 2014-04-12 DIAGNOSIS — M159 Polyosteoarthritis, unspecified: Secondary | ICD-10-CM | POA: Diagnosis not present

## 2014-04-12 DIAGNOSIS — F3132 Bipolar disorder, current episode depressed, moderate: Secondary | ICD-10-CM | POA: Diagnosis not present

## 2014-04-12 DIAGNOSIS — Z720 Tobacco use: Secondary | ICD-10-CM | POA: Insufficient documentation

## 2014-04-12 NOTE — ED Notes (Signed)
Pt reports she had an MRI on 12/30, still awaiting results and interested in finding out the results.

## 2014-04-12 NOTE — ED Provider Notes (Signed)
CSN: [id number]     Arrival date & time 04/12/14  0445 History   First MD Initiated Contact with Patient 04/12/14 [phone]     Chief Complaint  Patient presents with  . Joint Swelling     (Consider location/radiation/quality/duration/timing/severity/associated sxs/prior Treatment) The history is provided by the patient.   44 year old female comes in complaining of swelling in both of her knees over the last 24 hours. There is no pain or itching. She notices it more when she stands up. She is worried that she is having an allergic reaction but she's not sure what she might be reacting to. She did have an MRI last week and did receive IV contrast and is worried she might be reacting to the contrast. She also has noted some swollen areas in her wrists. She gives a history of multiple complaints and has been evaluated by rheumatologist who have not felt that she had any significant rheumatologist condition. She did have the MRI scan recently for her headaches and has not had a report.  Past Medical History  Diagnosis Date  . Asthma   . Anxiety   . Mental disorder   . Bipolar 1 disorder   . Endometriosis   . PONV (postoperative nausea and vomiting)   . Termination of pregnancy     x 2 at age 36 and 44 yrs old  . Depression   . Headache(784.0)     otc med prn  . Arthritis     hands, lower back, knees  . Fibromyalgia   . Scoliosis   . Scoliosis   . Allergy    Past Surgical History  Procedure Laterality Date  . Nose surgery      rhinoplasty at age 27 yrs  . Wisdom tooth extraction    . Facial cosmetic surgery      right cheek  . Laparoscopy Right 11/11/2013    Procedure: LAPAROSCOPY OPERATIVE with  Drainage of  RIGHT Ovarian ENDOMETRIOMA;  Surgeon: Elveria Royals, MD;  Location: [facility];  Service: Gynecology;  Laterality: Right;  . Dilation and curettage of uterus    . Breast surgery     Family History  Problem Relation Age of Onset  . Diabetes Mother   . Hypertension Mother   .  Stroke Mother   . Mental illness Mother   . Heart disease Father   . Hyperlipidemia Father   . Hypertension Father   . Diabetes Maternal Grandmother   . Heart disease Maternal Grandmother   . Hyperlipidemia Maternal Grandmother   . Hypertension Maternal Grandmother   . Mental illness Maternal Grandmother   . Heart disease Maternal Grandfather   . Hyperlipidemia Maternal Grandfather   . Hypertension Maternal Grandfather   . Heart disease Paternal Grandfather   . Hyperlipidemia Paternal Grandfather   . Hypertension Paternal Grandfather   . Stroke Paternal Grandfather    History  Substance Use Topics  . Smoking status: Current Every Day Smoker -- 29 years    Types: Cigarettes, E-cigarettes  . Smokeless tobacco: Never Used  . Alcohol Use: No     Comment: "rare"   OB History    Gravida Para Term Preterm AB TAB SAB Ectopic Multiple Living   2    2 2     0     Review of Systems  All other systems reviewed and are negative.     Allergies  Sulfites; Other; Percocet; and Sulfa antibiotics  Home Medications   Prior to Admission medications   Medication  Sig Start Date End Date Taking? Authorizing Provider  ACETYLCYSTEINE PO Take 1 tablet by mouth 3 (three) times a week.    Historical Provider, MD  albuterol (PROVENTIL HFA;VENTOLIN HFA) 108 (90 BASE) MCG/ACT inhaler Inhale 1-2 puffs into the lungs every 6 (six) hours as needed for wheezing or shortness of breath. 09/02/13   Thao P Le, DO  amoxicillin (AMOXIL) 500 MG capsule 2 caps bid 02/11/14   Janne Napoleon, NP  chlorhexidine (PERIDEX) 0.12 % solution Use as directed 15 mLs in the mouth or throat 2 (two) times daily. 02/11/14   Janne Napoleon, NP  clonazePAM (KLONOPIN) 2 MG tablet Take 2-4 mg by mouth at bedtime as needed (sleep).    Historical Provider, MD  diphenhydrAMINE (BENADRYL) 25 MG tablet Take 50-100 mg by mouth at bedtime as needed for sleep.     Historical Provider, MD  HYDROcodone-acetaminophen (NORCO/VICODIN) 5-325 MG per  tablet Take 1 tablet by mouth every 6 (six) hours as needed for moderate pain. 03/04/14   Deirdre C Poe, CNM  ibuprofen (ADVIL,MOTRIN) 600 MG tablet Take 1 tablet (600 mg total) by mouth every 6 (six) hours as needed. 03/04/14   Deirdre Freida Busman, CNM  Magnesium 250 MG TABS Take 250 mg by mouth daily.     Historical Provider, MD  Multiple Vitamin (MULTIVITAMIN WITH MINERALS) TABS tablet Take 1 tablet by mouth daily.    Historical Provider, MD  temazepam (RESTORIL) 15 MG capsule Take 60 mg by mouth at bedtime as needed for sleep.     Historical Provider, MD   BP 113/77 mmHg  Pulse 78  Temp(Src) 99.3 F (37.4 C) (Oral)  Resp 16  Ht 5\' 8"  (1.727 m)  Wt 135 lb (61.236 kg)  BMI 20.53 kg/m2  SpO2 98%  LMP 04/07/2014 Physical Exam  Nursing note and vitals reviewed.  44 year old female, resting comfortably and in no acute distress. Vital signs are normal. Oxygen saturation is 98%, which is normal. Head is normocephalic and atraumatic. PERRLA, EOMI. Oropharynx is clear. Neck is nontender and supple without adenopathy or JVD. Back is nontender and there is no CVA tenderness. Lungs are clear without rales, wheezes, or rhonchi. Chest is nontender. Heart has regular rate and rhythm without murmur. Abdomen is soft, flat, nontender without masses or hepatosplenomegaly and peristalsis is normoactive. Extremities have no cyanosis or edema, full range of motion is present. Knees have no effusion and no synovitis and no obvious swelling. Appearance is normal. Wrists have what appears to be a bony protrusion on the ulnar aspect which is non-mobile and nontender. Skin is warm and dry without rash. Neurologic: Mental status is normal, cranial nerves are intact, there are no motor or sensory deficits.  ED Course  Procedures (including critical care time)   MDM   Final diagnoses:  Bilateral knee swelling    Subjective swelling of both knees without any physical findings. 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 Patient is informed of this finding and she is referred back to her PCP for appropriate referrals. She is referred to orthopedics for further evaluation of her joint complaints.    Delora Fuel, MD 31/54/00 [id number]

## 2014-04-12 NOTE — ED Notes (Signed)
Unable to obtain pt's signature, pt reports she has to go. Pt reports she understands follow up instructions with the orthopedic.

## 2014-04-12 NOTE — Discharge Instructions (Signed)
Follow up with the orthopedic physician.

## 2014-04-12 NOTE — Telephone Encounter (Signed)
Patient called very concerned. Asked questions about a referral and wants to speak with Dr. Marin Comment. Advised patient of Dr. Gus Puma office hours today from 5-8:30pm. Forwarding message to referrals pool.   [phone] (M)

## 2014-04-12 NOTE — Telephone Encounter (Signed)
LMOM to see if there was anything we could forward to Dr. Marin Comment regarding questions to her MRI results. Also seen where she visited the ER, see how she was doing, update?

## 2014-04-12 NOTE — ED Notes (Signed)
MD at bedside. 

## 2014-04-12 NOTE — ED Notes (Signed)
Pt reports bilateral knee swelling that started yesterday and bilateral wrist swelling that started at least one month ago. Pt reports hx of seeing a rheumatologist but was never told she has arthritis. A&O X4.

## 2014-04-13 ENCOUNTER — Ambulatory Visit (INDEPENDENT_AMBULATORY_CARE_PROVIDER_SITE_OTHER): Payer: Medicare Other

## 2014-04-13 ENCOUNTER — Ambulatory Visit (INDEPENDENT_AMBULATORY_CARE_PROVIDER_SITE_OTHER): Payer: Medicare Other | Admitting: Family Medicine

## 2014-04-13 VITALS — BP 124/84 | HR 90 | Temp 97.5°F | Resp 18 | Ht 68.0 in | Wt 131.8 lb

## 2014-04-13 DIAGNOSIS — F411 Generalized anxiety disorder: Secondary | ICD-10-CM | POA: Diagnosis not present

## 2014-04-13 DIAGNOSIS — D352 Benign neoplasm of pituitary gland: Secondary | ICD-10-CM

## 2014-04-13 DIAGNOSIS — M25531 Pain in right wrist: Secondary | ICD-10-CM

## 2014-04-13 DIAGNOSIS — F319 Bipolar disorder, unspecified: Secondary | ICD-10-CM

## 2014-04-13 DIAGNOSIS — M25532 Pain in left wrist: Secondary | ICD-10-CM

## 2014-04-13 DIAGNOSIS — M2540 Effusion, unspecified joint: Secondary | ICD-10-CM | POA: Diagnosis not present

## 2014-04-13 NOTE — Telephone Encounter (Signed)
Will be in to see Dr Marin Comment this evening to discuss all of these concerns.

## 2014-04-13 NOTE — Progress Notes (Signed)
Chief Complaint:  Chief Complaint  Patient presents with  . MRI Results  . Hand Pain    swelling--right hand    HPI: Catherine Olsen is a 44 y.o. female who is here to d/w me her MRI results She is not doing well. She thinks she has thickening in her face She is anxious about her 6.7 mm pituatary adenoma on MRI  She went to the ER for joint swelling, she has not done anything since she ahs been there.  She complains of bialteral wrist pain, she states ehr wrist bones on the thumbside have enlarged, she thinsk this is abnormal.  She thinks the muscles are wasted and also her wrist are more knobby.  She wants to know if she has acromegaly. Int he past she has been evaluated by many specialist and she does not think she needs to see another specialist ie ortho if I think she does not need it.   She has been eval by rheumatologist ruled out Lyme disease, Lupus, RA She has been to ortho for scoliosis and had priro back pain, she had an area on prior MRI that was related to ruptured disc She has been to endocrinologist and was tested for IGF and was negative She has been to ob/gyn  For endometrioma   Past Medical History  Diagnosis Date  . Asthma   . Anxiety   . Mental disorder   . Bipolar 1 disorder   . Endometriosis   . PONV (postoperative nausea and vomiting)   . Termination of pregnancy     x 2 at age 2 and 44 yrs old  . Depression   . Headache(784.0)     otc med prn  . Arthritis     hands, lower back, knees  . Fibromyalgia   . Scoliosis   . Scoliosis   . Allergy    Past Surgical History  Procedure Laterality Date  . Nose surgery      rhinoplasty at age 73 yrs  . Wisdom tooth extraction    . Facial cosmetic surgery      right cheek  . Laparoscopy Right 11/11/2013    Procedure: LAPAROSCOPY OPERATIVE with  Drainage of  RIGHT Ovarian ENDOMETRIOMA;  Surgeon: Elveria Royals, MD;  Location: [facility];  Service: Gynecology;  Laterality: Right;  . Dilation and curettage  of uterus    . Breast surgery     History   Social History  . Marital Status: Divorced    Spouse Name: N/A    Number of Children: N/A  . Years of Education: N/A   Social History Main Topics  . Smoking status: Current Every Day Smoker -- 29 years    Types: Cigarettes, E-cigarettes  . Smokeless tobacco: Never Used  . Alcohol Use: No     Comment: "rare"  . 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 Sexual Activity: No     Comment: abortion at 11 and 35yrs. of age    Other Topics Concern  . None   Social History Narrative   Family History  Problem Relation Age of Onset  . Diabetes Mother   . Hypertension Mother   . Stroke Mother   . Mental illness Mother   . Heart disease Father   . Hyperlipidemia Father   . Hypertension Father   . Diabetes Maternal Grandmother   . Heart disease Maternal Grandmother   . Hyperlipidemia  Maternal Grandmother   . Hypertension Maternal Grandmother   . Mental illness Maternal Grandmother   . Heart disease Maternal Grandfather   . Hyperlipidemia Maternal Grandfather   . Hypertension Maternal Grandfather   . Heart disease Paternal Grandfather   . Hyperlipidemia Paternal Grandfather   . Hypertension Paternal Grandfather   . Stroke Paternal Grandfather    Allergies  Allergen Reactions  . Sulfites   . Other Anxiety    steroids  . Percocet [Oxycodone-Acetaminophen] Palpitations  . Sulfa Antibiotics Hives   Prior to Admission medications   Medication Sig Start Date End Date Taking? Authorizing Provider  diphenhydrAMINE (BENADRYL) 25 MG tablet Take 50-100 mg by mouth at bedtime as needed for sleep.    Yes Historical Provider, MD  ibuprofen (ADVIL,MOTRIN) 600 MG tablet Take 1 tablet (600 mg total) by mouth every 6 (six) hours as needed. 03/04/14  Yes Deirdre C Poe, CNM  Magnesium 250 MG TABS Take 250 mg by mouth daily.    Yes Historical Provider, MD  Multiple Vitamin (MULTIVITAMIN WITH MINERALS) TABS tablet  Take 1 tablet by mouth daily.   Yes Historical Provider, MD  ACETYLCYSTEINE PO Take 1 tablet by mouth 3 (three) times a week.    Historical Provider, MD  albuterol (PROVENTIL HFA;VENTOLIN HFA) 108 (90 BASE) MCG/ACT inhaler Inhale 1-2 puffs into the lungs every 6 (six) hours as needed for wheezing or shortness of breath. Patient not taking: Reported on 04/13/2014 09/02/13   Nainika Newlun P Keyon Winnick, DO  amoxicillin (AMOXIL) 500 MG capsule 2 caps bid Patient not taking: Reported on 04/13/2014 02/11/14   Janne Napoleon, NP  chlorhexidine (PERIDEX) 0.12 % solution Use as directed 15 mLs in the mouth or throat 2 (two) times daily. Patient not taking: Reported on 04/13/2014 02/11/14   Janne Napoleon, NP  clonazePAM (KLONOPIN) 2 MG tablet Take 2-4 mg by mouth at bedtime as needed (sleep).    Historical Provider, MD  HYDROcodone-acetaminophen (NORCO/VICODIN) 5-325 MG per tablet Take 1 tablet by mouth every 6 (six) hours as needed for moderate pain. Patient not taking: Reported on 04/13/2014 03/04/14   Deirdre C Poe, CNM  temazepam (RESTORIL) 15 MG capsule Take 60 mg by mouth at bedtime as needed for sleep.     Historical Provider, MD     ROS: The patient denies fevers, chills, night sweats, unintentional weight loss, chest pain, palpitations, wheezing, dyspnea on exertion, nausea, vomiting, abdominal pain, dysuria, hematuria, melena, numbness, weakness, or tingling.   All other systems have been reviewed and were otherwise negative with the exception of those mentioned in the HPI and as above.    PHYSICAL EXAM: Filed Vitals:   04/13/14 1714  BP: 124/84  Pulse: 90  Temp: 97.5 F (36.4 C)  Resp: 18   Filed Vitals:   04/13/14 1714  Height: 5\' 8"  (1.727 m)  Weight: 131 lb 12.8 oz (59.784 kg)   Body mass index is 20.04 kg/(m^2).  General: Alert, anxious thin female HEENT:  Normocephalic, atraumatic, oropharynx patent. EOMI, PERRLA. FUndo exam normal Cardiovascular:  Regular rate and rhythm, no rubs murmurs or gallops.  No  Carotid bruits, radial pulse intact. No pedal edema.  Respiratory: Clear to auscultation bilaterally.  No wheezes, rales, or rhonchi.  No cyanosis, no use of accessory musculature GI: No organomegaly, abdomen is soft and non-tender, positive bowel sounds.  No masses. Skin: No rashes. Neurologic: Facial musculature symmetric. Psychiatric: Patient is appropriate throughout our interaction. Lymphatic: No cervical lymphadenopathy Musculoskeletal: Gait intact. CN 2-12 grossly normal Wrist  are both normal, no tremors or cogwheeling, no cogwheeling 5/5 strength, no pathological msk wasting, she is thin, boney prominences are present,  Sensation intact.   LABS: Results for orders placed or performed during the hospital encounter of 02/06/14  Urinalysis, Routine w reflex microscopic  Result Value Ref Range   Color, Urine YELLOW YELLOW   APPearance HAZY (A) CLEAR   Specific Gravity, Urine 1.025 1.005 - 1.030   pH 6.0 5.0 - 8.0   Glucose, UA NEGATIVE NEGATIVE mg/dL   Hgb urine dipstick NEGATIVE NEGATIVE   Bilirubin Urine NEGATIVE NEGATIVE   Ketones, ur 15 (A) NEGATIVE mg/dL   Protein, ur NEGATIVE NEGATIVE mg/dL   Urobilinogen, UA 0.2 0.0 - 1.0 mg/dL   Nitrite NEGATIVE NEGATIVE   Leukocytes, UA NEGATIVE NEGATIVE  CBC with Differential  Result Value Ref Range   WBC 8.0 4.0 - 10.5 K/uL   RBC 4.27 3.87 - 5.11 MIL/uL   Hemoglobin 14.1 12.0 - 15.0 g/dL   HCT 40.1 36.0 - 46.0 %   MCV 93.9 78.0 - 100.0 fL   MCH 33.0 26.0 - 34.0 pg   MCHC 35.2 30.0 - 36.0 g/dL   RDW 13.8 11.5 - 15.5 %   Platelets 188 150 - 400 K/uL   Neutrophils Relative % 66 43 - 77 %   Neutro Abs 5.3 1.7 - 7.7 K/uL   Lymphocytes Relative 25 12 - 46 %   Lymphs Abs 2.0 0.7 - 4.0 K/uL   Monocytes Relative 5 3 - 12 %   Monocytes Absolute 0.4 0.1 - 1.0 K/uL   Eosinophils Relative 4 0 - 5 %   Eosinophils Absolute 0.3 0.0 - 0.7 K/uL   Basophils Relative 0 0 - 1 %   Basophils Absolute 0.0 0.0 - 0.1 K/uL  Pregnancy, urine POC    Result Value Ref Range   Preg Test, Ur NEGATIVE NEGATIVE     EKG/XRAY:   Primary read interpreted by Dr[facility] Marin Comment at [facility]. Neg for fracture or dislocation   ASSESSMENT/PLAN: Encounter Diagnoses  Name Primary?  . Pain in both wrists   . Pituitary microadenoma Yes  . Bipolar 1 disorder   . Joint swelling   . Anxiety state    Ms Mills is a very anxious 44 year old female with bipolar 1 who is noncompliant with meds, she takes meds when she feels she needs it, endometrioma, GAD who presents to discuss her MRI results and wrist and hand swelling.  Recent brain MRI showed a pituitary microadenoma, she is convinced she has hormonal changes and acromegaly Labs pending: cortisol and insulin growth factor, I am not dfoung any more labs, she is getting referred to endocrinology, she has been to endocrinology before and saw Dr Chalmers Cater who told her she did not have anything wrong with her pituitary. She does not want to go back there again. Will refer to [place], I have asked for records from Dr Porfirio Oar office but we have not received them Her xrays were negative for anything acute, we went through old xrays from years prior and had a lengthy discussion about the lack of change in her hands. I have cancelled the MR arthrogram that was ordered by someone else.  Advise to stop smoking, she ahs actually increased her smoking due to the recent results of her MRI She does not want to go back consistently on her Bipolar meds, recommend that she does , she denies any SI.Hi/Hallucinations F/u with labs  More than 45 min face to  face time spent with Ms Risinger in counseling and easing her anxiety Gross sideeffects, risk and benefits, and alternatives of medications d/w patient. Patient is aware that all medications have potential sideeffects and we are unable to predict every sideeffect or drug-drug interaction that may occur.  Darryll Raju, [place], DO 04/13/2014 8:32 PM

## 2014-04-13 NOTE — Telephone Encounter (Signed)
Spoke to pt- she is very anxious about the recent finding on her MRI. She notes that there has been some swelling in other body areas. Bilateral knees and her wrist. She was told in the ED that she has a ganglion cyst on her wrist. She

## 2014-04-13 NOTE — Telephone Encounter (Signed)
Pt called again. States she would like to speak with someone regarding all of this. Please return call. CB # [phone]

## 2014-04-14 LAB — PROLACTIN: Prolactin: 3.3 ng/mL

## 2014-04-17 ENCOUNTER — Encounter: Payer: Self-pay | Admitting: *Deleted

## 2014-04-17 ENCOUNTER — Telehealth: Payer: Self-pay

## 2014-04-17 NOTE — Telephone Encounter (Signed)
Lab is still pending. Pt advised via mychart

## 2014-04-17 NOTE — Telephone Encounter (Signed)
Patient requesting her full lab results done on 04/13/14. Per patient she only receive one of them through mychart. Patient stated she was concerned because the last time we lost her blood. Patients call back number is [phone]

## 2014-04-19 ENCOUNTER — Encounter: Payer: Self-pay | Admitting: Family Medicine

## 2014-04-20 LAB — INSULIN-LIKE GROWTH FACTOR
IGF-I, LC/MS: 161 ng/mL (ref 52–328)
Z-Score (Female): 0.3 {STDV} (ref ?–2.0)

## 2014-04-27 ENCOUNTER — Ambulatory Visit (INDEPENDENT_AMBULATORY_CARE_PROVIDER_SITE_OTHER): Payer: Medicare Other | Admitting: Family Medicine

## 2014-04-27 DIAGNOSIS — R102 Pelvic and perineal pain: Secondary | ICD-10-CM

## 2014-04-27 DIAGNOSIS — N83201 Unspecified ovarian cyst, right side: Secondary | ICD-10-CM

## 2014-04-27 DIAGNOSIS — N8320 Unspecified ovarian cysts: Secondary | ICD-10-CM | POA: Diagnosis not present

## 2014-04-27 LAB — POCT URINALYSIS DIPSTICK
Bilirubin, UA: NEGATIVE
GLUCOSE UA: NEGATIVE
KETONES UA: NEGATIVE
Leukocytes, UA: NEGATIVE
NITRITE UA: NEGATIVE
Protein, UA: NEGATIVE
RBC UA: NEGATIVE
Spec Grav, UA: <=1.005
Urobilinogen, UA: 0.2
pH, UA: 6

## 2014-04-27 LAB — POCT UA - MICROSCOPIC ONLY
BACTERIA, U MICROSCOPIC: NEGATIVE
Casts, Ur, LPF, POC: NEGATIVE
Crystals, Ur, HPF, POC: NEGATIVE
Mucus, UA: NEGATIVE
RBC, urine, microscopic: NEGATIVE
Yeast, UA: NEGATIVE

## 2014-04-27 LAB — POCT URINE PREGNANCY: Preg Test, Ur: NEGATIVE

## 2014-04-27 MED ORDER — HYDROCODONE-ACETAMINOPHEN 5-325 MG PO TABS: 1.0000 | ORAL_TABLET | Freq: Four times a day (QID) | ORAL | Status: DC | PRN

## 2014-04-27 NOTE — Progress Notes (Addendum)
Subjective:   This chart was scribed for Wardell Honour, MD by Forrestine Him, [facility] Scribe. This patient was seen in room 13 and the patient's care was started 9:02 PM.    Patient ID: Catherine Olsen, female    DOB: 1971-01-25, 44 y.o.   MRN: [id number]  04/27/2014  Pelvic Pain and Abdominal Pain   HPI  HPI Comments: Catherine Olsen is a 44 y.o. female with a PMHx of endometriosis, fibromyalgia, and bipolar 1 disorder who presents to [facility] complaining of ongoing, waxing and waning, moderate pelvic pain that has been chronic in nature for past year. Pain has gradually worsened in last few days. Pt states pain is exacerbated during menstrual period which she attributes to "the cyst growing in size". Currently pain is rated 7/10 with baseline pain of 5/10. She has tried OTC Ibuprofen 800mg  with mild temporary improvement for symptoms. Typically discomfort is 3-5/10 at baseline but has worsened in last few days without normal improvement with OTC medications. No fever, chills, diaphoresis, diarrhea, constipation, dysuria, urinary frequency or urgency, vaginal discharge, nausea or vomiting. Catherine Olsen underwent pelvic ultrasound November 2015 which showed a R complex cystic structure measuring 7 cm in size. Pt is scheduled for surgery to remove cyst May 22, 2014 at [facility] 04/06/14 and normal.  Last sexual activity 07/2013; intercourse is too painful with current ovarian cyst.   Pt was prescribed 60 hydrocodone 5/325 on 04/03/14  by surgeon at [facility]; pt denied receiving this rx or taking it but then remembered that she had filled it when the [place] Controlled Substance registry was reviewed with her. She states she is currently out of pills but has not contact Dr. Roger Shelter at [facility] for a refill; she reports that she is having car problems and is unable to drive to [facility] to pick up rx.  "I figured you could just call Dr. Roger Shelter tonight for  approval."  Last STD screening in ED 01/08/2014 with negative GC/Chlam and HIV testing.  Patient asks, "What are you worried about?  My gynecologist states that these large ovarian cysts rarely torque because of adhesions that normally develop due to chronic state of cyst.  Gynecologist also states that my pain would be a 10/10 with a torsion.  My pain is only slightly worse than baseline and is not as severe as it has been in the past."  She is followed by Dr. Katharina Caper at [facility] for OB/GYN surgery.    Review of Systems  Constitutional: Negative for fever, chills, diaphoresis and fatigue.  Gastrointestinal: Positive for nausea and abdominal pain. Negative for vomiting, diarrhea, constipation, blood in stool, abdominal distention, anal bleeding and rectal pain.  Genitourinary: Positive for pelvic pain. Negative for dysuria, urgency, frequency, hematuria, flank pain, vaginal bleeding, vaginal discharge, genital sores, vaginal pain and menstrual problem.  Skin: Negative for rash.  Neurological: Positive for dizziness.    Past Medical History  Diagnosis Date  . Asthma   . Anxiety   . Mental disorder   . Bipolar 1 disorder   . Endometriosis   . PONV (postoperative nausea and vomiting)   . Termination of pregnancy     x 2 at age 65 and 44 yrs old  . Depression   . Headache(784.0)     otc med prn  . Arthritis     hands, lower back, knees  . Fibromyalgia   . Scoliosis   . Scoliosis   .  Allergy    Past Surgical History  Procedure Laterality Date  . Nose surgery      rhinoplasty at age 68 yrs  . Wisdom tooth extraction    . Facial cosmetic surgery      right cheek  . Laparoscopy Right 11/11/2013    Procedure: LAPAROSCOPY OPERATIVE with  Drainage of  RIGHT Ovarian ENDOMETRIOMA;  Surgeon: Elveria Royals, MD;  Location: [facility];  Service: Gynecology;  Laterality: Right;  . Dilation and curettage of uterus    . Breast surgery     Allergies  Allergen Reactions  . Sulfites     . Other Anxiety    steroids  . Percocet [Oxycodone-Acetaminophen] Palpitations  . Sulfa Antibiotics Hives   Current Outpatient Prescriptions  Medication Sig Dispense Refill  . diphenhydrAMINE (BENADRYL) 25 MG tablet Take 50-100 mg by mouth at bedtime as needed for sleep.     Marland Kitchen ibuprofen (ADVIL,MOTRIN) 600 MG tablet Take 1 tablet (600 mg total) by mouth every 6 (six) hours as needed. 30 tablet 1  . Magnesium 250 MG TABS Take 250 mg by mouth daily.     . Multiple Vitamin (MULTIVITAMIN WITH MINERALS) TABS tablet Take 1 tablet by mouth daily.    . clonazePAM (KLONOPIN) 2 MG tablet Take 2 mg by mouth 2 (two) times daily.    Marland Kitchen HYDROcodone-acetaminophen (NORCO/VICODIN) 5-325 MG per tablet Take 1 tablet by mouth every 6 (six) hours as needed for moderate pain. 30 tablet 0  . temazepam (RESTORIL) 15 MG capsule Take 60 mg by mouth at bedtime as needed for sleep.      No current facility-administered medications for this visit.       Objective:    BP 92/66 mmHg  Pulse 91  Temp(Src) 97.9 F (36.6 C) (Oral)  Resp 16  Ht 5' 7.75" (1.721 m)  Wt 134 lb 12.8 oz (61.145 kg)  BMI 20.64 kg/m2  SpO2 100%  LMP 04/06/2014 Physical Exam  Constitutional: She is oriented to person, place, and time. She appears well-developed and well-nourished. No distress.  HENT:  Head: Normocephalic.  Eyes: Conjunctivae and EOM are normal. Pupils are equal, round, and reactive to light.  Neck: Normal range of motion. Neck supple.  Cardiovascular: Normal rate, regular rhythm and normal heart sounds.   No murmur heard. Pulmonary/Chest: Effort normal and breath sounds normal. No respiratory distress. She has no wheezes. She has no rales.  Abdominal: Soft. Bowel sounds are normal. She exhibits no distension and no mass. There is no hepatosplenomegaly. There is tenderness in the right lower quadrant. There is no rebound, no guarding and no CVA tenderness. No hernia.  Very anxious to have abdomen examined due to  "anticipated pain in RLQ".  Tolerated exam well when performed; no g/r.  Genitourinary: Uterus normal. There is no rash, tenderness or lesion on the right labia. There is no rash, tenderness or lesion on the left labia. Cervix exhibits no motion tenderness, no discharge and no friability. Right adnexum displays mass, tenderness and fullness. Left adnexum displays no mass, no tenderness and no fullness. No erythema, tenderness or bleeding in the vagina. No foreign body around the vagina. No signs of injury around the vagina. Vaginal discharge found.  +TTP R adnexa with palpable enlargement of adnexa.  +scant vaginal discharge white yellow.  +very anxious to have bimanual exam performed due to "anticipated pain with exam of R ovary".    Musculoskeletal: Normal range of motion.  Neurological: She is alert and oriented to person,  place, and time.  Skin: She is not diaphoretic.  Psychiatric: She has a normal mood and affect.  Nursing note and vitals reviewed.  Results for orders placed or performed in visit on 04/27/14  POCT UA - Microscopic Only  Result Value Ref Range   WBC, Ur, HPF, POC 0-1    RBC, urine, microscopic neg    Bacteria, U Microscopic neg    Mucus, UA neg    Epithelial cells, urine per micros 1-3    Crystals, Ur, HPF, POC neg    Casts, Ur, LPF, POC neg    Yeast, UA neg   POCT urinalysis dipstick  Result Value Ref Range   Color, UA yellow    Clarity, UA clear    Glucose, UA neg    Bilirubin, UA neg    Ketones, UA neg    Spec Grav, UA <=1.005    Blood, UA neg    pH, UA 6.0    Protein, UA neg    Urobilinogen, UA 0.2    Nitrite, UA neg    Leukocytes, UA Negative   POCT urine pregnancy  Result Value Ref Range   Preg Test, Ur Negative        Assessment & Plan:   1. Pelvic pain in female   2. Cyst of right ovary     -Worsening R pelvic pain in past three days with known 7cm R ovarian cyst.  Pelvic exam and abdominal exam benign thus concern for torsion of R ovary  less likely.  Advised pt that she will warrant immediate pelvic ultrasound if pain worsens.  Precautions reviewed. -No fever, vomiting, urinary symptoms, vaginal discharge to suggest other causes of pelvic pain. -Rx for Hydrocodone 5/325 #30 provided.  Advised patient to receive further refills from gynecologist.  History of cocaine use in past; thus, will prescribe narcotics cautiously. -Per review of [place] Controlled Substance registry, patient using hydrocodone more frequently than she reports.  Confronted patient with this data during visit. She did not recall using hydrocodone so frequently.    Meds ordered this encounter  Medications  . clonazePAM (KLONOPIN) 2 MG tablet    Sig: Take 2 mg by mouth 2 (two) times daily.  Marland Kitchen DISCONTD: HYDROcodone-acetaminophen (NORCO/VICODIN) 5-325 MG per tablet    Sig:     Refill:  0  . HYDROcodone-acetaminophen (NORCO/VICODIN) 5-325 MG per tablet    Sig: Take 1 tablet by mouth every 6 (six) hours as needed for moderate pain.    Dispense:  30 tablet    Refill:  0    No Follow-up on file.  I personally performed the services described in this documentation, which was scribed in my presence. The recorded information has been reviewed and considered.   Topacio Cella Elayne Guerin, M.D. [facility] [address] [place], [place]  [id number] [phone] phone [phone] fax

## 2014-05-05 ENCOUNTER — Ambulatory Visit (INDEPENDENT_AMBULATORY_CARE_PROVIDER_SITE_OTHER): Payer: Medicare Other | Admitting: Family Medicine

## 2014-05-05 ENCOUNTER — Encounter: Payer: Self-pay | Admitting: Family Medicine

## 2014-05-05 DIAGNOSIS — N83201 Unspecified ovarian cyst, right side: Secondary | ICD-10-CM

## 2014-05-05 DIAGNOSIS — N801 Endometriosis of ovary: Secondary | ICD-10-CM | POA: Diagnosis not present

## 2014-05-05 DIAGNOSIS — N8320 Unspecified ovarian cysts: Secondary | ICD-10-CM

## 2014-05-05 DIAGNOSIS — R102 Pelvic and perineal pain: Secondary | ICD-10-CM

## 2014-05-05 DIAGNOSIS — N80129 Deep endometriosis of ovary, unspecified ovary: Secondary | ICD-10-CM

## 2014-05-05 MED ORDER — HYDROCODONE-ACETAMINOPHEN 5-325 MG PO TABS: 1.0000 | ORAL_TABLET | Freq: Four times a day (QID) | ORAL | Status: DC | PRN

## 2014-05-09 NOTE — Progress Notes (Addendum)
Chief Complaint:  Chief Complaint  Patient presents with  . Annual Exam    HPI: Catherine Olsen is a 44 y.o. female who is here for  Refill of her pain meds for endometrima.  She is doing well overall. We had scheduled for an annual but she does not want a pap , she does not want anymore blood work done. All of that was recently done.  She is scheduled to have surgery to get her ovarian cyst/endometrioma removed. She was seen here recently for abd pain due to this and there was concerne for a ovarian torsion. bnut she states the pain is similar or less than before but still present now and again. She is having surgery in Feb, next week.  She was seen here on 04/08/14, please see note below  Catherine Olsen is a 44 y.o. female with a PMHx of endometriosis, fibromyalgia, and bipolar 1 disorder who presents to [facility] complaining of ongoing, waxing and waning, moderate pelvic pain that has been chronic in nature for past year. Pain has gradually worsened in last few days. Pt states pain is exacerbated during menstrual period which she attributes to "the cyst growing in size". Currently pain is rated 7/10 with baseline pain of 5/10. She has tried OTC Ibuprofen 800mg  with mild temporary improvement for symptoms. Typically discomfort is 3-5/10 at baseline but has worsened in last few days without normal improvement with OTC medications. No fever, chills, diaphoresis, diarrhea, constipation, dysuria, urinary frequency or urgency, vaginal discharge, nausea or vomiting. Catherine Olsen underwent pelvic ultrasound November 2015 which showed a R complex cystic structure measuring 7 cm in size. Pt is scheduled for surgery to remove cyst May 22, 2014 at [facility] 04/06/14 and normal. Last sexual activity 07/2013; intercourse is too painful with current ovarian cyst. Pt was prescribed 60 hydrocodone 5/325 on 04/03/14 by surgeon at [facility]; pt denied receiving this rx or taking it but then  remembered that she had filled it when the [place] Controlled Substance registry was reviewed with her. She states she is currently out of pills but has not contact Dr. Roger Shelter at [facility] for a refill; she reports that she is having car problems and is unable to drive to [facility] to pick up rx. "I figured you could just call Dr. Roger Shelter tonight for approval." Last STD screening in ED 01/08/2014 with negative GC/Chlam and HIV testing.  Patient asks, "What are you worried about? My gynecologist states that these large ovarian cysts rarely torque because of adhesions that normally develop due to chronic state of cyst. Gynecologist also states that my pain would be a 10/10 with a torsion. My pain is only slightly worse than baseline and is not as severe as it has been in the past."  She is followed by Dr. Katharina Caper at [facility] for OB/GYN surgery.   US pelvis in 02/2014  Uterus  Measurements: 7.7 x 3.5 x 5.2 cm. Hypoechoic structure along the anterior uterus measures 1.4 x 0.7 x 1.2 cm. This is most compatible with a small uterine fibroid.  Endometrium  Thickness: 0.9 cm. No focal abnormality visualized.  Right ovary  Measurements: 7.3 x 5.7 x 6.6 cm. Again noted is a complex cystic-type structure in the right ovary that measures 6.9 x 5.1 x 6.1 cm. This structure has internal echoes and may have layering hyperechoic fluid or material.  Left ovary  Measurements: Left ovary measures 2.9 x 1.9 x 1.5 cm.  Normal appearance the left ovary with small follicles.  Pulsed Doppler evaluation demonstrates normal low-resistance arterial and venous waveforms in the left ovary. There is arterial flow in the right ovary.  IMPRESSION: No evidence for ovarian torsion.  Again noted is a large complex cystic structure in the right ovary. This structures has been present since at least 02/16/2013. Differential would include endometrioma versus complex cyst.  Small uterine  fibroid.   Electronically Signed  By: Markus Daft M.D.  On: 02/06/2014 21:41 Past Medical History  Diagnosis Date  . Asthma   . Anxiety   . Mental disorder   . Bipolar 1 disorder   . Endometriosis   . PONV (postoperative nausea and vomiting)   . Termination of pregnancy     x 2 at age 53 and 44 yrs old  . Depression   . Headache(784.0)     otc med prn  . Arthritis     hands, lower back, knees  . Fibromyalgia   . Scoliosis   . Scoliosis   . Allergy    Past Surgical History  Procedure Laterality Date  . Nose surgery      rhinoplasty at age 55 yrs  . Wisdom tooth extraction    . Facial cosmetic surgery      right cheek  . Laparoscopy Right 11/11/2013    Procedure: LAPAROSCOPY OPERATIVE with  Drainage of  RIGHT Ovarian ENDOMETRIOMA;  Surgeon: Elveria Royals, MD;  Location: [facility];  Service: Gynecology;  Laterality: Right;  . Dilation and curettage of uterus    . Breast surgery     History   Social History  . Marital Status: Divorced    Spouse Name: N/A    Number of Children: N/A  . Years of Education: N/A   Social History Main Topics  . Smoking status: Current Every Day Smoker -- 29 years    Types: Cigarettes, E-cigarettes  . Smokeless tobacco: Never Used  . Alcohol Use: No     Comment: "rare"  . 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 Sexual Activity: No     Comment: abortion at 72 and 83yrs. of age    Other Topics Concern  . None   Social History Narrative   Family History  Problem Relation Age of Onset  . Diabetes Mother   . Hypertension Mother   . Stroke Mother   . Mental illness Mother   . Heart disease Father   . Hyperlipidemia Father   . Hypertension Father   . Diabetes Maternal Grandmother   . Heart disease Maternal Grandmother   . Hyperlipidemia Maternal Grandmother   . Hypertension Maternal Grandmother   . Mental illness Maternal Grandmother   . Heart disease Maternal Grandfather   .  Hyperlipidemia Maternal Grandfather   . Hypertension Maternal Grandfather   . Heart disease Paternal Grandfather   . Hyperlipidemia Paternal Grandfather   . Hypertension Paternal Grandfather   . Stroke Paternal Grandfather    Allergies  Allergen Reactions  . Sulfites   . Other Anxiety    steroids  . Percocet [Oxycodone-Acetaminophen] Palpitations  . Sulfa Antibiotics Hives   Prior to Admission medications   Medication Sig Start Date End Date Taking? Authorizing Provider  diphenhydrAMINE (BENADRYL) 25 MG tablet Take 50-100 mg by mouth at bedtime as needed for sleep.    Yes Historical Provider, MD  HYDROcodone-acetaminophen (NORCO/VICODIN) 5-325 MG per tablet Take 1 tablet by mouth every 6 (  six) hours as needed for moderate pain. 05/05/14  Yes Thao P Le, DO  ibuprofen (ADVIL,MOTRIN) 600 MG tablet Take 1 tablet (600 mg total) by mouth every 6 (six) hours as needed. 03/04/14  Yes Deirdre C Poe, CNM  Magnesium 250 MG TABS Take 250 mg by mouth daily.    Yes Historical Provider, MD  Multiple Vitamin (MULTIVITAMIN WITH MINERALS) TABS tablet Take 1 tablet by mouth daily.   Yes Historical Provider, MD  clonazePAM (KLONOPIN) 2 MG tablet Take 2 mg by mouth 2 (two) times daily.    Historical Provider, MD  temazepam (RESTORIL) 15 MG capsule Take 60 mg by mouth at bedtime as needed for sleep.     Historical Provider, MD     ROS: The patient denies fevers, chills, night sweats, unintentional weight loss, chest pain, palpitations, wheezing, dyspnea on exertion, nausea, vomiting,  dysuria, hematuria, melena, numbness, weakness, or tingling.  All other systems have been reviewed and were otherwise negative with the exception of those mentioned in the HPI and as above.    PHYSICAL EXAM: Filed Vitals:   05/05/14 1007  BP: 109/74  Pulse: 80  Temp: 98.7 F (37.1 C)  Resp: 16   Filed Vitals:   05/05/14 1007  Height: 5\' 8"  (1.727 m)  Weight: 138 lb 14.4 oz (63.005 kg)   Body mass index is 21.12  kg/(m^2).  General: Alert, no acute distress, thin white female, slightly disheveled HEENT:  Normocephalic, atraumatic, oropharynx patent. EOMI, PERRLA Cardiovascular:  Regular rate and rhythm, no rubs murmurs or gallops.  No Carotid bruits, radial pulse intact. No pedal edema.  Respiratory: + wheezes No cyanosis, no use of accessory musculature GI: No organomegaly, abdomen is soft and diffuse abd tenderness, positive bowel sounds.  No masses. Skin: No rashes. Neurologic: Facial musculature symmetric. Psychiatric: Patient is appropriate throughout our interaction. Lymphatic: No cervical lymphadenopathy Musculoskeletal: Gait intact.   LABS: Results for orders placed or performed in visit on 04/27/14  POCT UA - Microscopic Only  Result Value Ref Range   WBC, Ur, HPF, POC 0-1    RBC, urine, microscopic neg    Bacteria, U Microscopic neg    Mucus, UA neg    Epithelial cells, urine per micros 1-3    Crystals, Ur, HPF, POC neg    Casts, Ur, LPF, POC neg    Yeast, UA neg   POCT urinalysis dipstick  Result Value Ref Range   Color, UA yellow    Clarity, UA clear    Glucose, UA neg    Bilirubin, UA neg    Ketones, UA neg    Spec Grav, UA <=1.005    Blood, UA neg    pH, UA 6.0    Protein, UA neg    Urobilinogen, UA 0.2    Nitrite, UA neg    Leukocytes, UA Negative   POCT urine pregnancy  Result Value Ref Range   Preg Test, Ur Negative      EKG/XRAY:   Primary read interpreted by Dr. Marin Comment at [facility].   ASSESSMENT/PLAN: Encounter Diagnoses  Name Primary?  . Right ovarian cyst   . Endometrioma of ovary   . Pelvic pain in female    Catherine is a pleasantly noncompliant self directed 44 year old female with a PMH of  bipolar 1 ( not on meds), GAD/Depression, fibromyalgia, tobacco abuse, endometriosis, right ovarian cyst/endomtrioma which has grown in size since her last laparoscopic procedure to drain it and who is scheduled for removal of this  in 1-2 weeks at [facility]. She is here for  pain control until procedure.  She cannot go back to her ob/gyn to get script, sicne that would require her to drive on the freeway , unles her step mom takes her and she doe not want to do that. She has anxiety with the freeway, makes her feel trap.I will give her a refill of her norco until her surgery.  Refill norco  Gross sideeffects, risk and benefits, and alternatives of medications d/w patient. Patient is aware that all medications have potential sideeffects and we are unable to predict every sideeffect or drug-drug interaction that may occur.  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

## 2014-05-14 ENCOUNTER — Emergency Department (HOSPITAL_COMMUNITY)
Admission: EM | Admit: 2014-05-14 | Discharge: 2014-05-14 | Disposition: A | Discharge status: Home / Self Care | Payer: Medicare Other | Attending: Emergency Medicine | Admitting: Emergency Medicine

## 2014-05-14 ENCOUNTER — Encounter (HOSPITAL_COMMUNITY): Payer: Self-pay | Admitting: Emergency Medicine

## 2014-05-14 DIAGNOSIS — Y9389 Activity, other specified: Secondary | ICD-10-CM | POA: Diagnosis not present

## 2014-05-14 DIAGNOSIS — M419 Scoliosis, unspecified: Secondary | ICD-10-CM | POA: Insufficient documentation

## 2014-05-14 DIAGNOSIS — M1990 Unspecified osteoarthritis, unspecified site: Secondary | ICD-10-CM | POA: Insufficient documentation

## 2014-05-14 DIAGNOSIS — Z720 Tobacco use: Secondary | ICD-10-CM | POA: Diagnosis not present

## 2014-05-14 DIAGNOSIS — X58XXXA Exposure to other specified factors, initial encounter: Secondary | ICD-10-CM | POA: Insufficient documentation

## 2014-05-14 DIAGNOSIS — Y998 Other external cause status: Secondary | ICD-10-CM | POA: Insufficient documentation

## 2014-05-14 DIAGNOSIS — Z8742 Personal history of other diseases of the female genital tract: Secondary | ICD-10-CM | POA: Insufficient documentation

## 2014-05-14 DIAGNOSIS — T7840XA Allergy, unspecified, initial encounter: Secondary | ICD-10-CM | POA: Diagnosis not present

## 2014-05-14 DIAGNOSIS — R21 Rash and other nonspecific skin eruption: Secondary | ICD-10-CM | POA: Diagnosis not present

## 2014-05-14 DIAGNOSIS — Y9289 Other specified places as the place of occurrence of the external cause: Secondary | ICD-10-CM | POA: Diagnosis not present

## 2014-05-14 DIAGNOSIS — Z79899 Other long term (current) drug therapy: Secondary | ICD-10-CM | POA: Diagnosis not present

## 2014-05-14 DIAGNOSIS — Z8659 Personal history of other mental and behavioral disorders: Secondary | ICD-10-CM | POA: Insufficient documentation

## 2014-05-14 DIAGNOSIS — J45909 Unspecified asthma, uncomplicated: Secondary | ICD-10-CM | POA: Diagnosis not present

## 2014-05-14 DIAGNOSIS — R220 Localized swelling, mass and lump, head: Secondary | ICD-10-CM | POA: Diagnosis present

## 2014-05-14 MED ORDER — EPINEPHRINE 0.3 MG/0.3ML IJ SOAJ: 0.3000 mg | Freq: Once | INTRAMUSCULAR | Status: DC

## 2014-05-14 NOTE — ED Notes (Signed)
Patient here with complaint of facial swelling, oral swelling, and hand swelling. States that it has been ongoing for about 2 weeks, but became much worse over the past week. States she can feel her mouth is "puffy" and she has been using her rescue inhaler without relief. Right now doesn't feel SOB. No apparent rash. States neck itches.

## 2014-05-14 NOTE — Discharge Instructions (Signed)
If you were given medicines take as directed.  If you are on coumadin or contraceptives realize their levels and effectiveness is altered by many different medicines.  If you have any reaction (rash, tongues swelling, other) to the medicines stop taking and see a physician.   If you develop throat swelling breathing difficulty or pass out from allergic reaction symptoms please use your EpiPen and come to the ER. Please follow up as directed and return to the ER or see a physician for new or worsening symptoms.  Thank you. Filed Vitals:   05/14/14 1959 05/14/14 2017  BP: 121/86 116/78  Pulse: 84 82  Temp: 97.7 F (36.5 C)   TempSrc: Oral   Resp: 16 23  Height: 5\' 8"  (1.727 m)   Weight: 135 lb (61.236 kg)   SpO2: 99% 99%

## 2014-05-14 NOTE — ED Notes (Signed)
Pt a/o x 4 on d/c with steady gait.pt refused wheel chair.

## 2014-05-14 NOTE — ED Provider Notes (Signed)
CSN: [id number]     Arrival date & time 05/14/14  1940 History   First MD Initiated Contact with Patient 05/14/14 2005     Chief Complaint  Patient presents with  . Facial Swelling  . Oral Swelling     (Consider location/radiation/quality/duration/timing/severity/associated sxs/prior Treatment) HPI Comments: 44 year old female with bipolar history, idiopathic allergic reactions presents with allergic reaction symptoms and signs. Patient had an episode of itchy rash and mild hives upper chest, mild left hand swelling and mild asthma-like symptoms that have the most part resolved except for the hand. Patient has had multiple episodes of intermittent allergic symptoms. Patient had a history of anaphylactic shock however has not used her EpiPen since. Patient has close follow-up with allergist tomorrow morning. Patient currently is not on steroids as she is awaiting further workup by endocrinology for pituitary tumor. Symptoms have almost resolved. Patient did not use her EpiPen.  The history is provided by the patient.    Past Medical History  Diagnosis Date  . Asthma   . Anxiety   . Mental disorder   . Bipolar 1 disorder   . Endometriosis   . PONV (postoperative nausea and vomiting)   . Termination of pregnancy     x 2 at age 14 and 44 yrs old  . Depression   . Headache(784.0)     otc med prn  . Arthritis     hands, lower back, knees  . Fibromyalgia   . Scoliosis   . Scoliosis   . Allergy    Past Surgical History  Procedure Laterality Date  . Nose surgery      rhinoplasty at age 12 yrs  . Wisdom tooth extraction    . Facial cosmetic surgery      right cheek  . Laparoscopy Right 11/11/2013    Procedure: LAPAROSCOPY OPERATIVE with  Drainage of  RIGHT Ovarian ENDOMETRIOMA;  Surgeon: Elveria Royals, MD;  Location: [facility];  Service: Gynecology;  Laterality: Right;  . Dilation and curettage of uterus    . Breast surgery     Family History  Problem Relation Age of Onset  .  Diabetes Mother   . Hypertension Mother   . Stroke Mother   . Mental illness Mother   . Heart disease Father   . Hyperlipidemia Father   . Hypertension Father   . Diabetes Maternal Grandmother   . Heart disease Maternal Grandmother   . Hyperlipidemia Maternal Grandmother   . Hypertension Maternal Grandmother   . Mental illness Maternal Grandmother   . Heart disease Maternal Grandfather   . Hyperlipidemia Maternal Grandfather   . Hypertension Maternal Grandfather   . Heart disease Paternal Grandfather   . Hyperlipidemia Paternal Grandfather   . Hypertension Paternal Grandfather   . Stroke Paternal Grandfather    History  Substance Use Topics  . Smoking status: Current Every Day Smoker -- 29 years    Types: Cigarettes, E-cigarettes  . Smokeless tobacco: Never Used  . Alcohol Use: No     Comment: "rare"   OB History    Gravida Para Term Preterm AB TAB SAB Ectopic Multiple Living   2    2 2     0     Review of Systems  Constitutional: Negative for fever and chills.  HENT: Negative for congestion.   Eyes: Negative for visual disturbance.  Respiratory: Negative for shortness of breath.   Cardiovascular: Negative for chest pain.  Gastrointestinal: Negative for vomiting and abdominal pain.  Genitourinary: Negative for  dysuria and flank pain.  Musculoskeletal: Negative for back pain, neck pain and neck stiffness.  Skin: Positive for rash.  Neurological: Negative for light-headedness and headaches.      Allergies  Sulfamethoxazole; Oxycodone; Other; Percocet; Sulfa antibiotics; and Sulfites  Home Medications   Prior to Admission medications   Medication Sig Start Date End Date Taking? Authorizing Provider  diphenhydrAMINE (BENADRYL) 25 MG tablet Take 50-100 mg by mouth every 8 (eight) hours as needed for itching, allergies or sleep.    Yes Historical Provider, MD  Magnesium 250 MG TABS Take 250 mg by mouth daily.    Yes Historical Provider, MD  Multiple Vitamin  (MULTIVITAMIN WITH MINERALS) TABS tablet Take 1 tablet by mouth daily.   Yes Historical Provider, MD  EPINEPHrine (EPIPEN 2-PAK) 0.3 mg/0.3 mL IJ SOAJ injection Inject 0.3 mLs (0.3 mg total) into the muscle once. 05/14/14   Mariea Clonts, MD  HYDROcodone-acetaminophen (NORCO/VICODIN) 5-325 MG per tablet Take 1 tablet by mouth every 6 (six) hours as needed for moderate pain. 05/05/14   Thao P Le, DO  ibuprofen (ADVIL,MOTRIN) 600 MG tablet Take 1 tablet (600 mg total) by mouth every 6 (six) hours as needed. 03/04/14   Deirdre C Poe, CNM  temazepam (RESTORIL) 15 MG capsule Take 60 mg by mouth at bedtime as needed for sleep.     Historical Provider, MD   BP 116/78 mmHg  Pulse 82  Temp(Src) 97.7 F (36.5 C) (Oral)  Resp 23  Ht 5\' 8"  (1.727 m)  Wt 135 lb (61.236 kg)  BMI 20.53 kg/m2  SpO2 99%  LMP 04/06/2014 Physical Exam  Constitutional: She is oriented to person, place, and time. She appears well-developed and well-nourished.  HENT:  Head: Normocephalic and atraumatic.  No angioedema  Eyes: Conjunctivae are normal. Right eye exhibits no discharge. Left eye exhibits no discharge.  Neck: Normal range of motion. Neck supple. No tracheal deviation present.  Cardiovascular: Normal rate and regular rhythm.   Pulmonary/Chest: Effort normal and breath sounds normal.  Abdominal: Soft. She exhibits no distension. There is no tenderness. There is no guarding.  Neurological: She is alert and oriented to person, place, and time.  Skin: Skin is warm. No rash noted.  No significant hives or rash appreciated, no significant edema to the neck or hands appreciated.  Psychiatric: She has a normal mood and affect.  Nursing note and vitals reviewed.   ED Course  Procedures (including critical care time) Labs Review Labs Reviewed - No data to display  Imaging Review No results found.   EKG Interpretation None      MDM   Final diagnoses:  Allergic reaction, initial encounter   Patient with  multiple episodes of allergic symptoms being followed closely outpatient by allergist presents with similar. Symptoms resolved with time and after she took Benadryl prior to arrival. Her symptoms started approximately 2-3 hours prior to arrival. Discussed recommendation of steroids at least until she sees her allergist. Patient prefers to hold as her symptoms and signs of almost resolved and she does not one interfere with further testing that will happen in the next few weeks with endocrine and allergies. Patient understands strict reasons to return and her use the EpiPen.  Results and differential diagnosis were discussed with the patient/parent/guardian. Close follow up outpatient was discussed, comfortable with the plan.   Medications - No data to display  Filed Vitals:   05/14/14 1959 05/14/14 2017  BP: 121/86 116/78  Pulse: 84 82  Temp: 97.7  F (36.5 C)   TempSrc: Oral   Resp: 16 23  Height: 5\' 8"  (1.727 m)   Weight: 135 lb (61.236 kg)   SpO2: 99% 99%    Final diagnoses:  Allergic reaction, initial encounter       Mariea Clonts, MD 05/14/14 2142

## 2014-05-15 ENCOUNTER — Telehealth: Payer: Self-pay

## 2014-05-15 DIAGNOSIS — J309 Allergic rhinitis, unspecified: Secondary | ICD-10-CM | POA: Diagnosis not present

## 2014-05-15 DIAGNOSIS — N801 Endometriosis of ovary: Secondary | ICD-10-CM | POA: Diagnosis not present

## 2014-05-15 DIAGNOSIS — J069 Acute upper respiratory infection, unspecified: Secondary | ICD-10-CM | POA: Diagnosis not present

## 2014-05-15 DIAGNOSIS — J4530 Mild persistent asthma, uncomplicated: Secondary | ICD-10-CM | POA: Diagnosis not present

## 2014-05-15 NOTE — Telephone Encounter (Signed)
Patient signed a ROI form on Apr 13, 2014 for [facility] to send records to Dr. Chanetta Marshall in [place] but the form is incomplete. Spoke with patient on Jan 14 and she stated that she will bring the information to complete the form when she came for her follow up visit with Dr. Marin Comment on Jan 29. She did not leave any information for medical records to process request so request will be shredded. If she needs [place] to send or receive records, she can complete a new form.

## 2014-05-17 ENCOUNTER — Ambulatory Visit (INDEPENDENT_AMBULATORY_CARE_PROVIDER_SITE_OTHER): Payer: Medicare Other | Admitting: Family Medicine

## 2014-05-17 ENCOUNTER — Ambulatory Visit (INDEPENDENT_AMBULATORY_CARE_PROVIDER_SITE_OTHER): Payer: Medicare Other

## 2014-05-17 VITALS — BP 126/82 | HR 75 | Temp 97.6°F | Resp 18 | Ht 68.0 in | Wt 135.0 lb

## 2014-05-17 DIAGNOSIS — Z201 Contact with and (suspected) exposure to tuberculosis: Secondary | ICD-10-CM

## 2014-05-17 LAB — POCT CBC
Granulocyte percent: 64.4 %G (ref 37–80)
HCT, POC: 43.6 % (ref 37.7–47.9)
Hemoglobin: 14.1 g/dL (ref 12.2–16.2)
Lymph, poc: 2.3 (ref 0.6–3.4)
MCH, POC: 31.3 pg — AB (ref 27–31.2)
MCHC: 32.4 g/dL (ref 31.8–35.4)
MCV: 96.6 fL (ref 80–97)
MID (cbc): 0.4 (ref 0–0.9)
MPV: 7.5 fL (ref 0–99.8)
POC GRANULOCYTE: 4.8 (ref 2–6.9)
POC LYMPH %: 30.8 % (ref 10–50)
POC MID %: 4.8 %M (ref 0–12)
Platelet Count, POC: 229 10*3/uL (ref 142–424)
RBC: 4.51 M/uL (ref 4.04–5.48)
RDW, POC: 15.1 %
WBC: 7.4 10*3/uL (ref 4.6–10.2)

## 2014-05-17 LAB — POCT SEDIMENTATION RATE: POCT SED RATE: 10 mm/hr (ref 0–22)

## 2014-05-17 NOTE — Progress Notes (Signed)
Xray read and patient discussed with Dr. Tawni Pummel. Agree with assessment and plan of care per her note.
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
## 2014-05-17 NOTE — Progress Notes (Signed)
Catherine Olsen - 44 y.o. female MRN [id number]  Date of birth: September 29, 1970  SUBJECTIVE:  Including CC & ROS.  [name] is a 44 year old female past medical history significant for endometriosis with plans for repeat surgery within the next 4 weeks, bipolar type I disorder, recently diagnosed pituitary tumor, and mild intermittent asthma, and tobacco dependence. Patient reports that on 05/14/14 she presented to the emergency room out of concern of an allergic reaction. She had itching hives and respiratory distress. She also has had a history of anaphylactic shock in the past of unknown etiology. Following this ER visit patient was seen by her allergist on Monday who recommended that her pulmonary function was so poor he recommended treating her with a course of steroids for viral upper respiratory infection and hold off on proceeding with her endometrial removal surgery.  Patient's reason for presenting this evening is secondary to a friend reported possible tuberculosis. Patient reports that she was very close with his friend and often shared cigarettes. She wants to be evaluated for possible tuberculosis this evening. She refuses to do the PPD as she believes she's had a reaction to this in the past. Patient denies any current symptoms of fever, bodyaches, chills, night sweats, decreased appetite or weight loss, coughing, hemoptysis, excessive fatigue or weakness.   ROS:  Constitutional:  No fever, chills, or fatigue.  Respiratory:  No shortness of breath, cough, or wheezing currently Cardiovascular:  No palpitations, chest pain or syncope Gastrointestinal:  No nausea, no abdominal pain Review of systems otherwise negative except for what is stated in HPI  HISTORY: Past Medical, Surgical, Social, and Family History Reviewed & Updated per EMR. Pertinent Historical Findings include:  Endometriosis, bipolar disorder, pituitary tumor, mild intermittent asthma, tobacco dependence, anxiety  depression.  PHYSICAL EXAM:  VS: BP:126/82 mmHg  HR:75bpm  TEMP:97.6 F (36.4 C)(Oral)  RESP:100 %  HT:5\' 8"  (172.7 cm)   WT:135 lb (61.236 kg)  BMI:20.6 PHYSICAL EXAM: General:  Alert and oriented, No acute distress.   HENT:  Normocephalic, Oral mucosa is moist.   Eyes are equal and reactive to light, normal conjunctivae, normal hearing, mucous membrane is moist, no erythema, no exudate.  Bilateral ears have fluid with bulging but no erythema, TMs are intact.  Both nasal passages are inflamed, erythematous, with clear drainage and inflamed turbinate.  Sinus passages are tender to palpation.  Submandibular glands are fluctuant mobile and soft. Respiratory:  Lungs are clear to auscultation, Respirations are non-labored, Symmetrical chest wall expansion.   Cardiovascular:  Normal rate, Regular rhythm, No murmur, Good pulses equal in all extremities, No edema.   Gastrointestinal:  Soft, Non-tender, Non-distended, Normal bowel sounds, No organomegaly.   Integumentary:  Warm, Dry, No rash.   Neurologic:  Alert, Oriented, No focal defects Psychiatric:  Cooperative, Appropriate mood & affect.    [facility] reading (PRIMARY) by Dr. Ollen Barges 2 view of the Chest:   No signs of consolidation, effusion, or pneumothorax. No bony abnormalities are soft tissue abnormalities.  ASSESSMENT & PLAN:   Patient presents today for concern about exposure to tuberculosis from a Crohn's friend. Currently not having any symptoms including no fever, no body aches and chills, no cough no hemoptysis, no fatigue, no weight loss. Given the patient is asymptomatic at this time we offered her PPD to evaluate for TB which she refused. Thus will instead do a chest x-ray and Quantiferon Gold TB assey, CBC and sedimentation rate. CXR WNL.   We spent greater than 50% of our  30 minute office visit in counseling education regarding the patient current clinical problem, risks and benefits of treatment options, and recommend plans for  treatment or further evaluation

## 2014-05-18 DIAGNOSIS — N809 Endometriosis, unspecified: Secondary | ICD-10-CM | POA: Diagnosis not present

## 2014-05-18 DIAGNOSIS — D352 Benign neoplasm of pituitary gland: Secondary | ICD-10-CM | POA: Diagnosis not present

## 2014-05-19 DIAGNOSIS — D352 Benign neoplasm of pituitary gland: Secondary | ICD-10-CM | POA: Insufficient documentation

## 2014-05-19 DIAGNOSIS — E237 Disorder of pituitary gland, unspecified: Secondary | ICD-10-CM | POA: Insufficient documentation

## 2014-05-20 LAB — QUANTIFERON TB GOLD ASSAY (BLOOD)
INTERFERON GAMMA RELEASE ASSAY: NEGATIVE
Mitogen value: 9.81 IU/mL
QUANTIFERON TB AG MINUS NIL: 0 [IU]/mL
Quantiferon Nil Value: 0.02 IU/mL
TB Ag value: 0.02 IU/mL

## 2014-05-23 ENCOUNTER — Other Ambulatory Visit: Payer: Self-pay | Admitting: Family Medicine

## 2014-05-23 DIAGNOSIS — D352 Benign neoplasm of pituitary gland: Secondary | ICD-10-CM

## 2014-05-24 ENCOUNTER — Telehealth: Payer: Self-pay

## 2014-05-24 NOTE — Telephone Encounter (Signed)
Patient came in to let us know that she will be coming in the morning for blood work. Patient was informed that she would need an office visit for her blood work. Patient also gave [place] a print out of her blood work order from another office. This was placed in the nurse station box.

## 2014-05-25 NOTE — Telephone Encounter (Signed)
Spoke with [facility], she remembers seeing the pt's orders and states she will still have to be seen. She states she will let the lab know when pt arrives.

## 2014-05-27 ENCOUNTER — Emergency Department (HOSPITAL_COMMUNITY)
Admission: EM | Admit: 2014-05-27 | Discharge: 2014-05-27 | Disposition: A | Discharge status: Home / Self Care | Payer: Medicare Other | Attending: Emergency Medicine | Admitting: Emergency Medicine

## 2014-05-27 ENCOUNTER — Encounter (HOSPITAL_COMMUNITY): Payer: Self-pay | Admitting: *Deleted

## 2014-05-27 DIAGNOSIS — Z8659 Personal history of other mental and behavioral disorders: Secondary | ICD-10-CM | POA: Diagnosis not present

## 2014-05-27 DIAGNOSIS — Z79899 Other long term (current) drug therapy: Secondary | ICD-10-CM | POA: Insufficient documentation

## 2014-05-27 DIAGNOSIS — R51 Headache: Secondary | ICD-10-CM | POA: Diagnosis not present

## 2014-05-27 DIAGNOSIS — H9201 Otalgia, right ear: Secondary | ICD-10-CM | POA: Diagnosis not present

## 2014-05-27 DIAGNOSIS — H6123 Impacted cerumen, bilateral: Secondary | ICD-10-CM | POA: Insufficient documentation

## 2014-05-27 DIAGNOSIS — Z86018 Personal history of other benign neoplasm: Secondary | ICD-10-CM | POA: Diagnosis not present

## 2014-05-27 DIAGNOSIS — Z8742 Personal history of other diseases of the female genital tract: Secondary | ICD-10-CM | POA: Diagnosis not present

## 2014-05-27 DIAGNOSIS — R519 Headache, unspecified: Secondary | ICD-10-CM

## 2014-05-27 DIAGNOSIS — M159 Polyosteoarthritis, unspecified: Secondary | ICD-10-CM | POA: Diagnosis not present

## 2014-05-27 DIAGNOSIS — J45909 Unspecified asthma, uncomplicated: Secondary | ICD-10-CM | POA: Diagnosis not present

## 2014-05-27 DIAGNOSIS — Z720 Tobacco use: Secondary | ICD-10-CM | POA: Diagnosis not present

## 2014-05-27 MED ORDER — IBUPROFEN 800 MG PO TABS: 800.0000 mg | ORAL_TABLET | Freq: Three times a day (TID) | ORAL | Status: DC

## 2014-05-27 MED ORDER — HYDROCODONE-ACETAMINOPHEN 5-325 MG PO TABS
1.0000 | ORAL_TABLET | Freq: Once | ORAL | Status: AC
  Administered 2014-05-27: 1 via ORAL
  Filled 2014-05-27: qty 1

## 2014-05-27 MED ORDER — DOCUSATE SODIUM 50 MG/5ML PO LIQD
50.0000 mg | Freq: Once | ORAL | Status: AC
  Administered 2014-05-27: 50 mg via ORAL
  Filled 2014-05-27: qty 10

## 2014-05-27 MED ORDER — AMOXICILLIN-POT CLAVULANATE 875-125 MG PO TABS: 1.0000 | ORAL_TABLET | Freq: Two times a day (BID) | ORAL | Status: DC

## 2014-05-27 MED ORDER — CARBAMIDE PEROXIDE 6.5 % OT SOLN
5.0000 [drp] | OTIC | Status: AC
  Administered 2014-05-27: 5 [drp] via OTIC

## 2014-05-27 NOTE — ED Provider Notes (Signed)
CSN: [id number]     Arrival date & time 05/27/14  1758 History   First MD Initiated Contact with Patient 05/27/14 1815     Chief Complaint  Patient presents with  . Headache     (Consider location/radiation/quality/duration/timing/severity/associated sxs/prior Treatment) The history is provided by the patient and medical records.    This is a 44 y.o. F with PMH significant for asthma, anxiety, endometriosis, depression, fibromyalgia, frequent headaches, known pituitary microadenoma which she is followed for at [facility], presenting to the ED for right sided temporal headache secondary to right ear pain for the past 3 days.  Patient states she had a URI a few weeks ago and feels that she may be developing a right ear infection.  States she feels that there is "fluid" in her right ear.  States when she opens her mouth wide or hiccups she feels bubbles "popping" in her right ear.  States history or similar symptoms with prior ear infections.  She reports known pituitary tumor that was found last year.  She is followed by endocrinology at [facility] for this, states plan for now is close monitoring of lab work as tumor appears stable.  She is also scheduled to see ophthalmology same for pre-existing vision issues. She currently denies any visual disturbance, confusion, dizziness, lightheadedness, fever, chills, or neck pain.  No intervention tried PTA.  VSS on arrival.  Past Medical History  Diagnosis Date  . Asthma   . Anxiety   . Mental disorder   . Bipolar 1 disorder   . Endometriosis   . PONV (postoperative nausea and vomiting)   . Termination of pregnancy     x 2 at age 46 and 44 yrs old  . Depression   . Headache(784.0)     otc med prn  . Arthritis     hands, lower back, knees  . Fibromyalgia   . Scoliosis   . Scoliosis   . Allergy    Past Surgical History  Procedure Laterality Date  . Nose surgery      rhinoplasty at age 18 yrs  . Wisdom tooth extraction    . Facial cosmetic surgery       right cheek  . Laparoscopy Right 11/11/2013    Procedure: LAPAROSCOPY OPERATIVE with  Drainage of  RIGHT Ovarian ENDOMETRIOMA;  Surgeon: Elveria Royals, MD;  Location: [facility];  Service: Gynecology;  Laterality: Right;  . Dilation and curettage of uterus    . Breast surgery     Family History  Problem Relation Age of Onset  . Diabetes Mother   . Hypertension Mother   . Stroke Mother   . Mental illness Mother   . Heart disease Father   . Hyperlipidemia Father   . Hypertension Father   . Diabetes Maternal Grandmother   . Heart disease Maternal Grandmother   . Hyperlipidemia Maternal Grandmother   . Hypertension Maternal Grandmother   . Mental illness Maternal Grandmother   . Heart disease Maternal Grandfather   . Hyperlipidemia Maternal Grandfather   . Hypertension Maternal Grandfather   . Heart disease Paternal Grandfather   . Hyperlipidemia Paternal Grandfather   . Hypertension Paternal Grandfather   . Stroke Paternal Grandfather    History  Substance Use Topics  . Smoking status: Current Every Day Smoker -- 29 years    Types: Cigarettes, E-cigarettes  . Smokeless tobacco: Never Used  . Alcohol Use: No     Comment: "rare"   OB History    Gravida Para  Term Preterm AB TAB SAB Ectopic Multiple Living   2    2 2     0     Review of Systems  HENT: Positive for ear pain.   Neurological: Positive for headaches.  All other systems reviewed and are negative.   Allergies  Sulfamethoxazole; Oxycodone; Other; Percocet; Sulfa antibiotics; and Sulfites  Home Medications   Prior to Admission medications   Medication Sig Start Date End Date Taking? Authorizing Provider  aspirin 325 MG tablet Take 325 mg by mouth every 6 (six) hours as needed for moderate pain.   Yes Historical Provider, MD  diphenhydrAMINE (BENADRYL) 25 MG tablet Take 50-100 mg by mouth every 8 (eight) hours as needed for itching, allergies or sleep.    Yes Historical Provider, MD  ibuprofen (ADVIL,MOTRIN)  600 MG tablet Take 1 tablet (600 mg total) by mouth every 6 (six) hours as needed. 03/04/14  Yes Deirdre C Poe, CNM  Magnesium 250 MG TABS Take 250 mg by mouth daily.    Yes Historical Provider, MD  Multiple Vitamin (MULTIVITAMIN WITH MINERALS) TABS tablet Take 1 tablet by mouth daily.   Yes Historical Provider, MD  EPINEPHrine (EPIPEN 2-PAK) 0.3 mg/0.3 mL IJ SOAJ injection Inject 0.3 mLs (0.3 mg total) into the muscle once. 05/14/14   Mariea Clonts, MD  HYDROcodone-acetaminophen (NORCO/VICODIN) 5-325 MG per tablet Take 1 tablet by mouth every 6 (six) hours as needed for moderate pain. Patient not taking: Reported on 05/17/2014 05/05/14   Thao P Le, DO   BP 132/81 mmHg  Pulse 76  Temp(Src) 97.9 F (36.6 C)  Resp 18  Ht 5\' 8"  (1.727 m)  Wt 135 lb (61.236 kg)  BMI 20.53 kg/m2  SpO2 100%  LMP 05/27/2014   Physical Exam  Constitutional: She is oriented to person, place, and time. She appears well-developed and well-nourished. No distress.  HENT:  Head: Normocephalic and atraumatic.  Left Ear: Tympanic membrane normal.  Mouth/Throat: Oropharynx is clear and moist.  No temporal tenderness or variscosities  R cerumen impaction, TM not visualized, no mastoid tenderness Partial left cerumen impaction, partially visualized TM appears normal  Eyes: Conjunctivae and EOM are normal. Pupils are equal, round, and reactive to light.  Neck: Normal range of motion and full passive range of motion without pain. Neck supple. No spinous process tenderness and no muscular tenderness present. No rigidity.  No meningismus  Cardiovascular: Normal rate, regular rhythm and normal heart sounds.   Pulmonary/Chest: Effort normal and breath sounds normal. No respiratory distress. She has no wheezes.  Musculoskeletal: Normal range of motion.  Neurological: She is alert and oriented to person, place, and time.  AAOx3, answering questions appropriately; equal strength UE and LE bilaterally; CN grossly intact; moves  all extremities appropriately without ataxia; no focal neuro deficits or facial asymmetry appreciated  Skin: Skin is warm and dry. She is not diaphoretic.  Psychiatric: She has a normal mood and affect.  Nursing note and vitals reviewed.   ED Course  Procedures (including critical care time) Labs Review Labs Reviewed - No data to display  Imaging Review No results found.   EKG Interpretation None      MDM   Final diagnoses:  Ear pain, right  Headache, unspecified headache type   44 year old female with right ear pain and subsequent right sided temporal headache for the past 3 days. She does have hx of recurrent headaches and recent URI symptoms.  She is afebrile and nontoxic in appearance. She has a completely  occluded right ear canal secondary to cerumen impaction, TM cannot be visualized. She has no mastoid tenderness or facial cellulitis. She has no clinical signs of meningitis. Her neurologic exam is nonfocal. She does have known pituitary microadenoma which is under the care of endocrinology at [facility]. I have reviewed records-- it appears that her tumor is stable without complicating factors and patient is being monitoring yearly.  At this time, her biggest complaint is right ear pain.  Given negative neurologic exam, low suspicion for acute intracranial pathology.  Patient given dose or norco in ED.  Attempted cerumen impaction, patient unable to tolerate.  Colace placed in ear and second attempt made by Dr. Mingo Amber-- patient became irritated and began cursing.  Will apply numbing drops to ear and attempt once more.   9:14 PM Headache slightly improved after norco.  Made 3rd attempt to disimpact cerumen, patient again not able to tolerate.  She jerked away, began cursing at me.  Will not make further attempts.  Patient will be started on augmentin for possible OM.  Will refer to ENT for further evaluation.  Discussed plan with patient, he/she acknowledged understanding  and agreed with plan of care.  Return precautions given for new or worsening symptoms.  9:29 PM At time of discharge patient requesting prescription for norco.  According to [place] controlled substance database patient has had 2 narcotic prescriptions within the past 22 days for a total of 90 tablets of norco 5-325.  I have elected not to write for further narcotics as i do not believe this is indicated at this time.  Patient upset about this and requests to file a complaint.  She was given number for patient experience.  Case discussed with Dr. Mingo Amber who evaluated patient and agrees with assessment and plan of care.  Larene Pickett, PA-C 05/27/14 2326  Evelina Bucy, MD 05/27/14 [phone]

## 2014-05-27 NOTE — Discharge Instructions (Signed)
Take the prescribed medication as directed. Follow-up with ENT-- call to schedule appt. Return to the ED for new or worsening symptoms.

## 2014-05-27 NOTE — ED Notes (Signed)
Pt c/o a headache with rt ear pain for 3-4 days n no vomiting.  lmp now

## 2014-05-27 NOTE — ED Notes (Addendum)
Pt given discharge papers and became verbally upset with nursing staff when she was not given norco prescription. Discussed with providers [facility] and Mingo [facility] on patient's behalf, requesting norco script. No new orders given d/t pt being given 90 day supply of norco by other providers in [place] in the last 20 days. Recommended that pt can utilize her previous script, and augmentin and ibuprofen in order to manage her pain. Pt became verbally aggressive with nursing staff stating that her primary has given her those prescriptions for pre-exisiting conditions and that she is using the medications as prescribed, implying that she is out of norco. Apologized to patient, and provided number for patient experience. Pt requesting EDP's immediate supervisors number. Pt given number to office of patient experience and encouraged to call them.

## 2014-05-29 ENCOUNTER — Ambulatory Visit (INDEPENDENT_AMBULATORY_CARE_PROVIDER_SITE_OTHER): Payer: Medicare Other | Admitting: Family Medicine

## 2014-05-29 VITALS — BP 106/74 | HR 62 | Temp 97.3°F | Resp 16 | Ht 68.0 in | Wt 134.0 lb

## 2014-05-29 DIAGNOSIS — J3489 Other specified disorders of nose and nasal sinuses: Secondary | ICD-10-CM

## 2014-05-29 DIAGNOSIS — R519 Headache, unspecified: Secondary | ICD-10-CM

## 2014-05-29 DIAGNOSIS — H6691 Otitis media, unspecified, right ear: Secondary | ICD-10-CM | POA: Diagnosis not present

## 2014-05-29 DIAGNOSIS — R51 Headache: Secondary | ICD-10-CM

## 2014-05-29 DIAGNOSIS — H6121 Impacted cerumen, right ear: Secondary | ICD-10-CM | POA: Diagnosis not present

## 2014-05-29 DIAGNOSIS — R102 Pelvic and perineal pain: Secondary | ICD-10-CM

## 2014-05-29 MED ORDER — HYDROCODONE-ACETAMINOPHEN 5-325 MG PO TABS: 1.0000 | ORAL_TABLET | Freq: Three times a day (TID) | ORAL | Status: DC | PRN

## 2014-05-29 NOTE — Progress Notes (Signed)
 Chief Complaint:  Chief Complaint  Patient presents with  . Ear Pain    Onset onset 1 to 2 weeks/seen in [facility] ED for the pain  . Facial Pain    also    HPI: Catherine Olsen is a 44 y.o. female who is here for  Right eear pain, this has been ongoing since 05/27/14, and even before then. She was seen in ED on 2/20 and they attempted to get her ear wax out but was unable to do it. She was given Augmentin and sent home and awas told thather facial pain made be related to sinus sxs and the ear pain is the same. She has been on augmentin for less than 2 days now and is not feelingbetter, wants to know if she has some infection that is going to go into her brain. Pain is sharp, constant and is makingher have a HA. Denies feers, chills, tinnitus  She was also supposed to have her ovarian cyst removed but went to see her pulmonologist and he stated she had a respiratory infectiona nd did not clear her for surgery.    Past Medical History  Diagnosis Date  . Asthma   . Anxiety   . Mental disorder   . Bipolar 1 disorder   . Endometriosis   . PONV (postoperative nausea and vomiting)   . Termination of pregnancy     x 2 at age 27 and 44 yrs old  . Depression   . Headache(784.0)     otc med prn  . Arthritis     hands, lower back, knees  . Fibromyalgia   . Scoliosis   . Scoliosis   . Allergy    Past Surgical History  Procedure Laterality Date  . Nose surgery      rhinoplasty at age 58 yrs  . Wisdom tooth extraction    . Facial cosmetic surgery      right cheek  . Laparoscopy Right 11/11/2013    Procedure: LAPAROSCOPY OPERATIVE with  Drainage of  RIGHT Ovarian ENDOMETRIOMA;  Surgeon: Elveria Royals, MD;  Location: [facility];  Service: Gynecology;  Laterality: Right;  . Dilation and curettage of uterus    . Breast surgery     History   Social History  . Marital Status: Divorced    Spouse Name: N/A  . Number of Children: N/A  . Years of Education: N/A   Social History Main Topics   . Smoking status: Current Every Day Smoker -- 29 years    Types: Cigarettes, E-cigarettes  . Smokeless tobacco: Never Used  . Alcohol Use: No     Comment: "rare"  . 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 Sexual Activity: No     Comment: abortion at 61 and 46yrs. of age    Other Topics Concern  . None   Social History Narrative   Family History  Problem Relation Age of Onset  . Diabetes Mother   . Hypertension Mother   . Stroke Mother   . Mental illness Mother   . Heart disease Father   . Hyperlipidemia Father   . Hypertension Father   . Diabetes Maternal Grandmother   . Heart disease Maternal Grandmother   . Hyperlipidemia Maternal Grandmother   . Hypertension Maternal Grandmother   . Mental illness Maternal Grandmother   . Heart disease Maternal Grandfather   . Hyperlipidemia Maternal Grandfather   .  Hypertension Maternal Grandfather   . Heart disease Paternal Grandfather   . Hyperlipidemia Paternal Grandfather   . Hypertension Paternal Grandfather   . Stroke Paternal Grandfather    Allergies  Allergen Reactions  . Sulfamethoxazole Anaphylaxis  . Oxycodone Other (See Comments)    Severe blindness?  . Other Anxiety    steroids  . Percocet [Oxycodone-Acetaminophen] Palpitations  . Sulfa Antibiotics Hives  . Sulfites Other (See Comments)    unknown   Prior to Admission medications   Medication Sig Start Date End Date Taking? Authorizing Provider  amoxicillin-clavulanate (AUGMENTIN) 875-125 MG per tablet Take 1 tablet by mouth every 12 (twelve) hours. 05/27/14  Yes Larene Pickett, PA-C  diphenhydrAMINE (BENADRYL) 25 MG tablet Take 50-100 mg by mouth every 8 (eight) hours as needed for itching, allergies or sleep.    Yes Historical Provider, MD  EPINEPHrine (EPIPEN 2-PAK) 0.3 mg/0.3 mL IJ SOAJ injection Inject 0.3 mLs (0.3 mg total) into the muscle once. 05/14/14  Yes Mariea Clonts, MD  ibuprofen (ADVIL,MOTRIN) 800 MG tablet  Take 1 tablet (800 mg total) by mouth 3 (three) times daily. 05/27/14  Yes Larene Pickett, PA-C  Magnesium 250 MG TABS Take 250 mg by mouth daily.    Yes Historical Provider, MD  Multiple Vitamin (MULTIVITAMIN WITH MINERALS) TABS tablet Take 1 tablet by mouth daily.   Yes Historical Provider, MD  HYDROcodone-acetaminophen (NORCO/VICODIN) 5-325 MG per tablet Take 1 tablet by mouth every 8 (eight) hours as needed for moderate pain. 05/29/14    P , DO     ROS: The patient denies fevers, chills, night sweats, unintentional weight loss, chest pain, palpitations, wheezing, dyspnea on exertion, nausea, vomiting, abdominal pain, dysuria, hematuria, melena, numbness, weakness, or tingling.   All other systems have been reviewed and were otherwise negative with the exception of those mentioned in the HPI and as above.    PHYSICAL EXAM: Filed Vitals:   05/29/14 1543  BP: 106/74  Pulse: 62  Temp: 97.3 F (36.3 C)  Resp: 16   Filed Vitals:   05/29/14 1543  Height: 5\' 8"  (1.727 m)  Weight: 134 lb (60.782 kg)   Body mass index is 20.38 kg/(m^2).  General: Alert, no acute distress HEENT:  Normocephalic, atraumatic, oropharynx patent. EOMI, PERRLA Erythematous throat, no exudates, + sinus tenderness, +/- erythematous/boggy nasal mucosa Right TM blocked with cerumen, after disimpaction slightly erythemtous , left TM nl Cardiovascular:  Regular rate and rhythm, no rubs murmurs or gallops.  No Carotid bruits, radial pulse intact. No pedal edema.  Respiratory: Clear to auscultation bilaterally.  No wheezes. No cyanosis, no use of accessory musculature GI: No organomegaly, abdomen is soft and non-tender, positive bowel sounds.  No masses. Skin: No rashes. Neurologic: Facial musculature symmetric. Psychiatric: Patient is appropriate throughout our interaction. Lymphatic: No cervical lymphadenopathy Musculoskeletal: Gait intact.   LABS: Results for orders placed or performed in visit on  05/17/14  Quantiferon tb gold assay  Result Value Ref Range   Interferon Gamma Release Assay NEGATIVE NEGATIVE   TB Ag value 0.02 IU/mL   Quantiferon Nil Value 0.02 IU/mL   Mitogen value 9.81 IU/mL   Quantiferon Tb Ag Minus Nil Value 0.00 IU/mL  POCT CBC  Result Value Ref Range   WBC 7.4 4.6 - 10.2 K/uL   Lymph, poc 2.3 0.6 - 3.4   POC LYMPH PERCENT 30.8 10 - 50 %L   MID (cbc) 0.4 0 - 0.9   POC MID % 4.8 0 - 12 %  M   POC Granulocyte 4.8 2 - 6.9   Granulocyte percent 64.4 37 - 80 %G   RBC 4.51 4.04 - 5.48 M/uL   Hemoglobin 14.1 12.2 - 16.2 g/dL   HCT, POC 43.6 37.7 - 47.9 %   MCV 96.6 80 - 97 fL   MCH, POC 31.3 (A) 27 - 31.2 pg   MCHC 32.4 31.8 - 35.4 g/dL   RDW, POC 15.1 %   Platelet Count, POC 229 142 - 424 K/uL   MPV 7.5 0 - 99.8 fL  POCT SEDIMENTATION RATE  Result Value Ref Range   POCT SED RATE 10 0 - 22 mm/hr     EKG/XRAY:   Primary read interpreted by Dr. Marin Comment at [facility].   ASSESSMENT/PLAN: Encounter Diagnoses  Name Primary?  . Acute right otitis media, recurrence not specified, unspecified otitis media type Yes  . Sinus pain   . Nonintractable headache, unspecified chronicity pattern, unspecified headache type   . Pelvic pain in female    Continue with Augmentin Flushed ears out successfully with warm water after VCO, no complications, felt better Rx norco for facial pain and also ovarian cyst pain, she will f.u with ob/gyn to reschedule surgery and also get clearance from pulm Fu prn    Gross sideeffects, risk and benefits, and alternatives of medications d/w patient. Patient is aware that all medications have potential sideeffects and we are unable to predict every sideeffect or drug-drug interaction that may occur.  , [place], DO 05/29/2014 4:48 PM

## 2014-05-30 DIAGNOSIS — F3132 Bipolar disorder, current episode depressed, moderate: Secondary | ICD-10-CM | POA: Diagnosis not present

## 2014-05-30 DIAGNOSIS — F4312 Post-traumatic stress disorder, chronic: Secondary | ICD-10-CM | POA: Diagnosis not present

## 2014-07-05 ENCOUNTER — Encounter (HOSPITAL_COMMUNITY): Payer: Self-pay | Admitting: *Deleted

## 2014-07-05 ENCOUNTER — Emergency Department (HOSPITAL_COMMUNITY)
Admission: EM | Admit: 2014-07-05 | Discharge: 2014-07-06 | Disposition: A | Discharge status: Home / Self Care | Payer: Medicare Other | Attending: Emergency Medicine | Admitting: Emergency Medicine

## 2014-07-05 DIAGNOSIS — Z3202 Encounter for pregnancy test, result negative: Secondary | ICD-10-CM | POA: Diagnosis not present

## 2014-07-05 DIAGNOSIS — Z8742 Personal history of other diseases of the female genital tract: Secondary | ICD-10-CM | POA: Diagnosis not present

## 2014-07-05 DIAGNOSIS — Z720 Tobacco use: Secondary | ICD-10-CM | POA: Diagnosis not present

## 2014-07-05 DIAGNOSIS — J45909 Unspecified asthma, uncomplicated: Secondary | ICD-10-CM | POA: Diagnosis not present

## 2014-07-05 DIAGNOSIS — K429 Umbilical hernia without obstruction or gangrene: Secondary | ICD-10-CM | POA: Diagnosis not present

## 2014-07-05 DIAGNOSIS — Z791 Long term (current) use of non-steroidal anti-inflammatories (NSAID): Secondary | ICD-10-CM | POA: Diagnosis not present

## 2014-07-05 DIAGNOSIS — M419 Scoliosis, unspecified: Secondary | ICD-10-CM | POA: Insufficient documentation

## 2014-07-05 DIAGNOSIS — Z79899 Other long term (current) drug therapy: Secondary | ICD-10-CM | POA: Diagnosis not present

## 2014-07-05 DIAGNOSIS — M1990 Unspecified osteoarthritis, unspecified site: Secondary | ICD-10-CM | POA: Insufficient documentation

## 2014-07-05 DIAGNOSIS — Z8659 Personal history of other mental and behavioral disorders: Secondary | ICD-10-CM | POA: Insufficient documentation

## 2014-07-05 DIAGNOSIS — R109 Unspecified abdominal pain: Secondary | ICD-10-CM | POA: Diagnosis present

## 2014-07-05 NOTE — ED Notes (Addendum)
Pt c/o lump to mid abd x10 days. States that she lump is hard and tender. Pt states that she though it was a lymph node. Also c/o intermittent abd pain. Has hx of ovarian endometriosis.

## 2014-07-06 ENCOUNTER — Encounter (HOSPITAL_COMMUNITY): Payer: Self-pay | Admitting: Radiology

## 2014-07-06 ENCOUNTER — Emergency Department (HOSPITAL_COMMUNITY): Payer: Medicare Other

## 2014-07-06 DIAGNOSIS — K429 Umbilical hernia without obstruction or gangrene: Secondary | ICD-10-CM | POA: Diagnosis not present

## 2014-07-06 LAB — URINE MICROSCOPIC-ADD ON

## 2014-07-06 LAB — POC URINE PREG, ED: Preg Test, Ur: NEGATIVE

## 2014-07-06 LAB — COMPREHENSIVE METABOLIC PANEL
ALT: 13 U/L (ref 0–35)
ANION GAP: 8 (ref 5–15)
AST: 17 U/L (ref 0–37)
Albumin: 3.6 g/dL (ref 3.5–5.2)
Alkaline Phosphatase: 52 U/L (ref 39–117)
BUN: 7 mg/dL (ref 6–23)
CO2: 25 mmol/L (ref 19–32)
CREATININE: 0.87 mg/dL (ref 0.50–1.10)
Calcium: 8.9 mg/dL (ref 8.4–10.5)
Chloride: 107 mmol/L (ref 96–112)
GFR calc Af Amer: >90 mL/min (ref >90)
GFR calc non Af Amer: 81 mL/min — ABNORMAL LOW (ref >90)
Glucose, Bld: 79 mg/dL (ref 70–99)
Potassium: 3.6 mmol/L (ref 3.5–5.1)
Sodium: 140 mmol/L (ref 135–145)
TOTAL PROTEIN: 6.7 g/dL (ref 6.0–8.3)
Total Bilirubin: 0.6 mg/dL (ref 0.3–1.2)

## 2014-07-06 LAB — CBC WITH DIFFERENTIAL/PLATELET
BASOS PCT: 0 % (ref 0–1)
Basophils Absolute: 0 10*3/uL (ref 0.0–0.1)
EOS ABS: 0 10*3/uL (ref 0.0–0.7)
EOS PCT: 1 % (ref 0–5)
HCT: 38.7 % (ref 36.0–46.0)
HEMOGLOBIN: 13.3 g/dL (ref 12.0–15.0)
Lymphocytes Relative: 40 % (ref 12–46)
Lymphs Abs: 1.7 10*3/uL (ref 0.7–4.0)
MCH: 31.4 pg (ref 26.0–34.0)
MCHC: 34.4 g/dL (ref 30.0–36.0)
MCV: 91.3 fL (ref 78.0–100.0)
MONOS PCT: 5 % (ref 3–12)
Monocytes Absolute: 0.2 10*3/uL (ref 0.1–1.0)
NEUTROS PCT: 54 % (ref 43–77)
Neutro Abs: 2.3 10*3/uL (ref 1.7–7.7)
PLATELETS: 209 10*3/uL (ref 150–400)
RBC: 4.24 MIL/uL (ref 3.87–5.11)
RDW: 13.5 % (ref 11.5–15.5)
WBC: 4.2 10*3/uL (ref 4.0–10.5)

## 2014-07-06 LAB — URINALYSIS, ROUTINE W REFLEX MICROSCOPIC
Bilirubin Urine: NEGATIVE
GLUCOSE, UA: NEGATIVE mg/dL
Ketones, ur: 15 mg/dL — AB
Leukocytes, UA: NEGATIVE
Nitrite: NEGATIVE
PROTEIN: NEGATIVE mg/dL
Specific Gravity, Urine: 1.008 (ref 1.005–1.030)
Urobilinogen, UA: 0.2 mg/dL (ref 0.0–1.0)
pH: 6 (ref 5.0–8.0)

## 2014-07-06 MED ORDER — HYDROCODONE-ACETAMINOPHEN 5-325 MG PO TABS: 1.0000 | ORAL_TABLET | Freq: Four times a day (QID) | ORAL | Status: DC | PRN

## 2014-07-06 MED ORDER — MORPHINE SULFATE 4 MG/ML IJ SOLN
4.0000 mg | Freq: Once | INTRAMUSCULAR | Status: DC
  Filled 2014-07-06: qty 1

## 2014-07-06 MED ORDER — IOHEXOL 300 MG/ML  SOLN
100.0000 mL | Freq: Once | INTRAMUSCULAR | Status: AC | PRN
  Administered 2014-07-06: 100 mL via INTRAVENOUS

## 2014-07-06 MED ORDER — IOHEXOL 300 MG/ML  SOLN: 25.0000 mL | INTRAMUSCULAR | Status: AC

## 2014-07-06 MED ORDER — HYDROCODONE-ACETAMINOPHEN 5-325 MG PO TABS
1.0000 | ORAL_TABLET | Freq: Once | ORAL | Status: AC
  Administered 2014-07-06: 1 via ORAL
  Filled 2014-07-06: qty 1

## 2014-07-06 NOTE — ED Notes (Signed)
CT notified pt has finished contrast dye

## 2014-07-06 NOTE — ED Provider Notes (Signed)
CSN: [id number]     Arrival date & time 07/05/14  2311 History  This chart was scribed for Ernestina Patches, MD by Rayfield Citizen, ED Scribe. This patient was seen in room D32C/D32C and the patient's care was started at 12:15 AM.    Chief Complaint  Patient presents with  . Abdominal Pain   Patient is a 44 y.o. female presenting with abdominal pain. The history is provided by the patient. No language interpreter was used.  Abdominal Pain Associated symptoms: no chest pain, no chills, no cough, no diarrhea, no dysuria, no fatigue, no fever, no nausea, no shortness of breath, no sore throat and no vomiting      HPI Comments: Catherine Olsen is a 44 y.o. female with past medical history of chronic abdominal pain associated with ovarian endometriosis who presents to the Emergency Department complaining of several days of "clenching" abdominal pain and 10 days of "hard, tender" periumbilical mass. Patient reports that she has recently recovered from a "flu-like" illness and originally believed this mass was a lymph node, but it did not improve as her symptoms resolved and her continued concern prompted her to come to the ED tonight. She reports that she is eating and drinking normally. She denies urinary symptoms; denies dysuria, hematuria. She denies difficulty with bowel movements.   She also reports 2 months of a "canker sore" on the back of her tongue. She denies recent trauma or injury to the area, denies any repetitive motions that might cause irritation to her tongue.   She is a smoker (for the past 29 years). First day of LNMP was 06/29/14. PCP is reproductive endocrin[facility] at [facility]. Prior abdominal surgery in August 2015; another surgery scheduled for February 2016 but this was postponed (endometrioma right ovary had decreased from 7cm to 5.5 cm).   Past Medical History  Diagnosis Date  . Asthma   . Anxiety   . Mental disorder   . Bipolar 1 disorder   . Endometriosis   . PONV (postoperative  nausea and vomiting)   . Termination of pregnancy     x 2 at age 75 and 44 yrs old  . Depression   . Headache(784.0)     otc med prn  . Arthritis     hands, lower back, knees  . Fibromyalgia   . Scoliosis   . Scoliosis   . Allergy    Past Surgical History  Procedure Laterality Date  . Nose surgery      rhinoplasty at age 48 yrs  . Wisdom tooth extraction    . Facial cosmetic surgery      right cheek  . Laparoscopy Right 11/11/2013    Procedure: LAPAROSCOPY OPERATIVE with  Drainage of  RIGHT Ovarian ENDOMETRIOMA;  Surgeon: Elveria Royals, MD;  Location: [facility];  Service: Gynecology;  Laterality: Right;  . Dilation and curettage of uterus    . Breast surgery     Family History  Problem Relation Age of Onset  . Diabetes Mother   . Hypertension Mother   . Stroke Mother   . Mental illness Mother   . Heart disease Father   . Hyperlipidemia Father   . Hypertension Father   . Diabetes Maternal Grandmother   . Heart disease Maternal Grandmother   . Hyperlipidemia Maternal Grandmother   . Hypertension Maternal Grandmother   . Mental illness Maternal Grandmother   . Heart disease Maternal Grandfather   . Hyperlipidemia Maternal Grandfather   . Hypertension Maternal Grandfather   .  Heart disease Paternal Grandfather   . Hyperlipidemia Paternal Grandfather   . Hypertension Paternal Grandfather   . Stroke Paternal Grandfather    History  Substance Use Topics  . Smoking status: Current Every Day Smoker -- 29 years    Types: Cigarettes, E-cigarettes  . Smokeless tobacco: Never Used  . Alcohol Use: No     Comment: "rare"   OB History    Gravida Para Term Preterm AB TAB SAB Ectopic Multiple Living   2    2 2     0     Review of Systems  Constitutional: Negative for fever, chills, diaphoresis, activity change, appetite change and fatigue.  HENT: Negative for congestion, facial swelling, rhinorrhea and sore throat.   Eyes: Negative for photophobia and discharge.   Respiratory: Negative for cough, chest tightness and shortness of breath.   Cardiovascular: Negative for chest pain, palpitations and leg swelling.  Gastrointestinal: Positive for abdominal pain. Negative for nausea, vomiting and diarrhea.  Endocrine: Negative for polydipsia and polyuria.  Genitourinary: Negative for dysuria, frequency, difficulty urinating and pelvic pain.  Musculoskeletal: Negative for back pain, arthralgias, neck pain and neck stiffness.  Skin: Negative for color change and wound.  Allergic/Immunologic: Negative for immunocompromised state.  Neurological: Negative for facial asymmetry, weakness, numbness and headaches.  Hematological: Does not bruise/bleed easily.  Psychiatric/Behavioral: Negative for confusion and agitation.   Allergies  Sulfamethoxazole; Oxycodone; Other; Percocet; Sulfa antibiotics; and Sulfites  Home Medications   Prior to Admission medications   Medication Sig Start Date End Date Taking? Authorizing Provider  amoxicillin-clavulanate (AUGMENTIN) 875-125 MG per tablet Take 1 tablet by mouth every 12 (twelve) hours. 05/27/14   Larene Pickett, PA-C  diphenhydrAMINE (BENADRYL) 25 MG tablet Take 50-100 mg by mouth every 8 (eight) hours as needed for itching, allergies or sleep.     Historical Provider, MD  EPINEPHrine (EPIPEN 2-PAK) 0.3 mg/0.3 mL IJ SOAJ injection Inject 0.3 mLs (0.3 mg total) into the muscle once. 05/14/14   Elnora Morrison, MD  HYDROcodone-acetaminophen (NORCO) 5-325 MG per tablet Take 1 tablet by mouth every 6 (six) hours as needed. 07/06/14   Ernestina Patches, MD  ibuprofen (ADVIL,MOTRIN) 800 MG tablet Take 1 tablet (800 mg total) by mouth 3 (three) times daily. 05/27/14   Larene Pickett, PA-C  Magnesium 250 MG TABS Take 250 mg by mouth daily.     Historical Provider, MD  Multiple Vitamin (MULTIVITAMIN WITH MINERALS) TABS tablet Take 1 tablet by mouth daily.    Historical Provider, MD   BP 126/83 mmHg  Pulse 57  Temp(Src) 98.2 F (36.8  C) (Oral)  Resp 18  Ht 5\' 8"  (1.727 m)  Wt 135 lb (61.236 kg)  BMI 20.53 kg/m2  SpO2 96%  LMP 06/29/2014 Physical Exam  Constitutional: She is oriented to person, place, and time. She appears well-developed and well-nourished. No distress.  HENT:  Head: Normocephalic.  Mouth/Throat: Oropharynx is clear and moist.  Eyes: Pupils are equal, round, and reactive to light.  Neck: Neck supple.  Cardiovascular: Normal rate, regular rhythm and normal heart sounds.   Pulmonary/Chest: Effort normal. No respiratory distress. She has no wheezes. She has rales (Bilateral bases).  Abdominal: Soft. She exhibits no distension. There is no tenderness. There is no rebound and no guarding.  Tender, rounded mass in the subcutaneous tissues in her periumbilical region.   Musculoskeletal: She exhibits no edema or tenderness.  Neurological: She is alert and oriented to person, place, and time.  Skin: Skin is warm  and dry.  Psychiatric: She has a normal mood and affect.  Nursing note and vitals reviewed.   ED Course  Procedures   DIAGNOSTIC STUDIES: Oxygen Saturation is 98% on RA, normal by my interpretation.    COORDINATION OF CARE: 12:28 AM Discussed treatment plan with pt at bedside and pt agreed to plan.   Labs Review Labs Reviewed  COMPREHENSIVE METABOLIC PANEL - Abnormal; Notable for the following:    GFR calc non Af Amer 81 (*)    All other components within normal limits  URINALYSIS, ROUTINE W REFLEX MICROSCOPIC - Abnormal; Notable for the following:    APPearance CLOUDY (*)    Hgb urine dipstick MODERATE (*)    Ketones, ur 15 (*)    All other components within normal limits  CBC WITH DIFFERENTIAL/PLATELET  URINE MICROSCOPIC-ADD ON  POC URINE PREG, ED    Imaging Review Ct Abdomen Pelvis W Contrast  07/06/2014   CLINICAL DATA:  Periumbilical tender mass for 10 days. Intermittent abdominal pain. History of ovarian endometriosis.  EXAM: CT ABDOMEN AND PELVIS WITH CONTRAST  TECHNIQUE:  Multidetector CT imaging of the abdomen and pelvis was performed using the standard protocol following bolus administration of intravenous contrast.  CONTRAST:  136mL OMNIPAQUE IOHEXOL 300 MG/ML  SOLN  COMPARISON:  CT 02/16/2013.  Pelvic ultrasound 02/06/2014  FINDINGS: Linear atelectasis in the right and left lower lobe.  There is diminutive fat containing umbilical hernia. Otherwise no evidence of periumbilical mass.  Borderline hepatomegaly, liver measures 18 cm in cranial caudal dimension. No focal hepatic lesion. The gallbladder, spleen, pancreas, and adrenal glands are normal. Kidneys demonstrate symmetric enhancement and excretion. There is no hydronephrosis or perinephric stranding. No focal renal abnormality.  Stomach is physiologically distended. There are no dilated or thickened bowel loops. No obstruction, oral contrast reaches to the cecum. There is a moderate volume of colonic stool. The appendix is normal. No free air, free fluid, or intra-abdominal fluid collection.  Abdominal aorta is normal in caliber. No retroperitoneal adenopathy.  Within the pelvis right ovarian soft tissue density lesion measures 5.6 x 4.7 cm, complex cyst previously measured 6.9 x 5.1 x 6.1 cm. Small uterine fibroid again seen. Left ovary is normal in size. No pelvic free fluid. Urinary bladder is physiologically distended.  There are no acute or suspicious osseous abnormalities.  IMPRESSION: 1. Diminutive fat containing umbilical hernia. There is otherwise no evidence of periumbilical mass. 2. Rounded right adnexal lesion measures 5.6 x 4.7 cm, slightly smaller than on prior ultrasound, likely represent endometrioma versus complex or hemorrhagic cyst. 3. Borderline hepatomegaly, unchanged.   Electronically Signed   By: Jeb Levering M.D.   On: 07/06/2014 02:56     EKG Interpretation None      MDM   Final diagnoses:  Periumbilical hernia   Patient presents with painful palpable mass to periumbilical area for  about 10 days.  She's a history of endometriosis and ovarian cyst.  She denies fever, chills, nausea, vomiting or diarrhea.  It is unchanged by eating or drinking.  She has not at night sweats, weight changes, or lymphadenopathy.  Physical exam does show a palpable firm mass around the periumbilical area skin changes.  CT abdomen pelvis ordered which showed a small periumbilical hernia with area of diminutive fat.  I suspect this is the palpable mass.  Her known adnexal lesion, slightly smaller than on prior ultrasound.  She follows up with reproductive endocrinology at [facility] for this.   [name] evaluation in  the Emergency Department is complete. It has been determined that no acute conditions requiring further emergency intervention are present at this time. The patient/guardian have been advised of the diagnosis and plan. We have discussed signs and symptoms that warrant return to the ED, such as changes or worsening in symptoms, worsening pain, fever, inability to tolerate liquids   I personally performed the services described in this documentation, which was scribed in my presence. The recorded information has been reviewed and is accurate.       Ernestina Patches, MD 07/07/14 [phone]

## 2014-07-06 NOTE — Discharge Instructions (Signed)

## 2014-07-07 ENCOUNTER — Telehealth (HOSPITAL_BASED_OUTPATIENT_CLINIC_OR_DEPARTMENT_OTHER): Payer: Self-pay | Admitting: Emergency Medicine

## 2014-07-09 ENCOUNTER — Encounter (HOSPITAL_COMMUNITY): Payer: Self-pay | Admitting: Emergency Medicine

## 2014-07-09 ENCOUNTER — Emergency Department (HOSPITAL_COMMUNITY)
Admission: EM | Admit: 2014-07-09 | Discharge: 2014-07-09 | Disposition: A | Discharge status: Home / Self Care | Payer: Medicare Other | Attending: Emergency Medicine | Admitting: Emergency Medicine

## 2014-07-09 DIAGNOSIS — M419 Scoliosis, unspecified: Secondary | ICD-10-CM | POA: Insufficient documentation

## 2014-07-09 DIAGNOSIS — J45909 Unspecified asthma, uncomplicated: Secondary | ICD-10-CM | POA: Insufficient documentation

## 2014-07-09 DIAGNOSIS — R109 Unspecified abdominal pain: Secondary | ICD-10-CM | POA: Diagnosis not present

## 2014-07-09 DIAGNOSIS — Z79899 Other long term (current) drug therapy: Secondary | ICD-10-CM | POA: Diagnosis not present

## 2014-07-09 DIAGNOSIS — M1990 Unspecified osteoarthritis, unspecified site: Secondary | ICD-10-CM | POA: Insufficient documentation

## 2014-07-09 DIAGNOSIS — Z720 Tobacco use: Secondary | ICD-10-CM | POA: Insufficient documentation

## 2014-07-09 DIAGNOSIS — Z8742 Personal history of other diseases of the female genital tract: Secondary | ICD-10-CM | POA: Insufficient documentation

## 2014-07-09 DIAGNOSIS — Z791 Long term (current) use of non-steroidal anti-inflammatories (NSAID): Secondary | ICD-10-CM | POA: Insufficient documentation

## 2014-07-09 DIAGNOSIS — Z8659 Personal history of other mental and behavioral disorders: Secondary | ICD-10-CM | POA: Diagnosis not present

## 2014-07-09 LAB — COMPREHENSIVE METABOLIC PANEL
ALBUMIN: 4.1 g/dL (ref 3.5–5.2)
ALK PHOS: 62 U/L (ref 39–117)
ALT: 14 U/L (ref 0–35)
AST: 20 U/L (ref 0–37)
Anion gap: 13 (ref 5–15)
BILIRUBIN TOTAL: 0.7 mg/dL (ref 0.3–1.2)
BUN: 18 mg/dL (ref 6–23)
CO2: 19 mmol/L (ref 19–32)
Calcium: 8.6 mg/dL (ref 8.4–10.5)
Chloride: 102 mmol/L (ref 96–112)
Creatinine, Ser: 0.77 mg/dL (ref 0.50–1.10)
GFR calc Af Amer: >90 mL/min (ref >90)
GLUCOSE: 89 mg/dL (ref 70–99)
POTASSIUM: 4.1 mmol/L (ref 3.5–5.1)
Sodium: 134 mmol/L — ABNORMAL LOW (ref 135–145)
TOTAL PROTEIN: 6.9 g/dL (ref 6.0–8.3)

## 2014-07-09 LAB — CBC WITH DIFFERENTIAL/PLATELET
BASOS PCT: 0 % (ref 0–1)
Basophils Absolute: 0 10*3/uL (ref 0.0–0.1)
EOS ABS: 0.2 10*3/uL (ref 0.0–0.7)
EOS PCT: 2 % (ref 0–5)
HCT: 43.2 % (ref 36.0–46.0)
Hemoglobin: 15.3 g/dL — ABNORMAL HIGH (ref 12.0–15.0)
LYMPHS ABS: 1.7 10*3/uL (ref 0.7–4.0)
LYMPHS PCT: 21 % (ref 12–46)
MCH: 31.9 pg (ref 26.0–34.0)
MCHC: 35.4 g/dL (ref 30.0–36.0)
MCV: 90.2 fL (ref 78.0–100.0)
MONOS PCT: 4 % (ref 3–12)
Monocytes Absolute: 0.3 10*3/uL (ref 0.1–1.0)
NEUTROS ABS: 5.8 10*3/uL (ref 1.7–7.7)
NEUTROS PCT: 73 % (ref 43–77)
PLATELETS: 261 10*3/uL (ref 150–400)
RBC: 4.79 MIL/uL (ref 3.87–5.11)
RDW: 13.5 % (ref 11.5–15.5)
WBC: 8 10*3/uL (ref 4.0–10.5)

## 2014-07-09 MED ORDER — HYDROCODONE-ACETAMINOPHEN 5-325 MG PO TABS
1.0000 | ORAL_TABLET | Freq: Once | ORAL | Status: AC
  Administered 2014-07-09: 1 via ORAL
  Filled 2014-07-09: qty 1

## 2014-07-09 NOTE — ED Provider Notes (Signed)
CSN: [id number]     Arrival date & time 07/09/14  1751 History   First MD Initiated Contact with Patient 07/09/14 1845     Chief Complaint  Patient presents with  . Abdominal Pain   HPI   44 year old female presents for abdominal pain. She reports she's had abdominal pain for 2 weeks, reports she thought it was swollen lymph node from the flu. She was seen here on 07/05/2014 for the same pain, she describes this as pain around her bellybutton, worse when she pushes on it. Sharp with no radiation of symptoms. Evaluation in the ED via CT scan showed a small periumbilical hernia she was discharged home with hydrocodone, reports today that she has ran out of her hydrocodone is still having abdominal pain. Patient denies any other associated symptoms including headache, chest pain, shortness of breath, any other abdominal pain, vaginal complaints, changes in bowel or bladder function or frequency. Reports normal bowel movements. She reports she has a Education officer, environmental and can make an appointment with him on Monday.   Past Medical History  Diagnosis Date  . Asthma   . Anxiety   . Mental disorder   . Bipolar 1 disorder   . Endometriosis   . PONV (postoperative nausea and vomiting)   . Termination of pregnancy     x 2 at age 49 and 44 yrs old  . Depression   . Headache(784.0)     otc med prn  . Arthritis     hands, lower back, knees  . Fibromyalgia   . Scoliosis   . Scoliosis   . Allergy    Past Surgical History  Procedure Laterality Date  . Nose surgery      rhinoplasty at age 42 yrs  . Wisdom tooth extraction    . Facial cosmetic surgery      right cheek  . Laparoscopy Right 11/11/2013    Procedure: LAPAROSCOPY OPERATIVE with  Drainage of  RIGHT Ovarian ENDOMETRIOMA;  Surgeon: Elveria Royals, MD;  Location: [facility];  Service: Gynecology;  Laterality: Right;  . Dilation and curettage of uterus    . Breast surgery     Family History  Problem Relation Age of Onset  . Diabetes Mother    . Hypertension Mother   . Stroke Mother   . Mental illness Mother   . Heart disease Father   . Hyperlipidemia Father   . Hypertension Father   . Diabetes Maternal Grandmother   . Heart disease Maternal Grandmother   . Hyperlipidemia Maternal Grandmother   . Hypertension Maternal Grandmother   . Mental illness Maternal Grandmother   . Heart disease Maternal Grandfather   . Hyperlipidemia Maternal Grandfather   . Hypertension Maternal Grandfather   . Heart disease Paternal Grandfather   . Hyperlipidemia Paternal Grandfather   . Hypertension Paternal Grandfather   . Stroke Paternal Grandfather    History  Substance Use Topics  . Smoking status: Current Every Day Smoker -- 29 years    Types: Cigarettes, E-cigarettes  . Smokeless tobacco: Never Used  . Alcohol Use: No     Comment: "rare"   OB History    Gravida Para Term Preterm AB TAB SAB Ectopic Multiple Living   2    2 2     0     Review of Systems  All other systems reviewed and are negative.     Allergies  Sulfamethoxazole; Oxycodone; Other; Percocet; Sulfa antibiotics; and Sulfites  Home Medications   Prior to Admission  medications   Medication Sig Start Date End Date Taking? Authorizing Provider  amoxicillin-clavulanate (AUGMENTIN) 875-125 MG per tablet Take 1 tablet by mouth every 12 (twelve) hours. 05/27/14   Larene Pickett, PA-C  diphenhydrAMINE (BENADRYL) 25 MG tablet Take 50-100 mg by mouth every 8 (eight) hours as needed for itching, allergies or sleep.     Historical Provider, MD  EPINEPHrine (EPIPEN 2-PAK) 0.3 mg/0.3 mL IJ SOAJ injection Inject 0.3 mLs (0.3 mg total) into the muscle once. 05/14/14   Elnora Morrison, MD  HYDROcodone-acetaminophen (NORCO) 5-325 MG per tablet Take 1 tablet by mouth every 6 (six) hours as needed. 07/06/14   Ernestina Patches, MD  ibuprofen (ADVIL,MOTRIN) 800 MG tablet Take 1 tablet (800 mg total) by mouth 3 (three) times daily. 05/27/14   Larene Pickett, PA-C  Magnesium 250 MG TABS Take  250 mg by mouth daily.     Historical Provider, MD  Multiple Vitamin (MULTIVITAMIN WITH MINERALS) TABS tablet Take 1 tablet by mouth daily.    Historical Provider, MD   BP 121/76 mmHg  Pulse 104  Temp(Src) 98.2 F (36.8 C) (Oral)  Resp 20  Ht 5\' 8"  (1.727 m)  Wt 131 lb 6 oz (59.591 kg)  BMI 19.98 kg/m2  SpO2 96%  LMP 06/29/2014 Physical Exam  Constitutional: She is oriented to person, place, and time. She appears well-developed and well-nourished.  HENT:  Head: Normocephalic and atraumatic.  Eyes: Pupils are equal, round, and reactive to light.  Neck: Normal range of motion. Neck supple. No JVD present. No tracheal deviation present. No thyromegaly present.  Cardiovascular: Normal rate, regular rhythm, normal heart sounds and intact distal pulses.  Exam reveals no gallop and no friction rub.   No murmur heard. Pulmonary/Chest: Effort normal and breath sounds normal. No stridor. No respiratory distress. She has no wheezes. She has no rales. She exhibits no tenderness.  Abdominal:  Tender around the mass subcutaneous tissue superior to the umbilicus  Musculoskeletal: Normal range of motion.  Lymphadenopathy:    She has no cervical adenopathy.  Neurological: She is alert and oriented to person, place, and time. Coordination normal.  Skin: Skin is warm and dry.  Psychiatric: She has a normal mood and affect. Her behavior is normal. Judgment and thought content normal.  Nursing note and vitals reviewed.   ED Course  Procedures (including critical care time) Labs Review Labs Reviewed  CBC WITH DIFFERENTIAL/PLATELET - Abnormal; Notable for the following:    Hemoglobin 15.3 (*)    All other components within normal limits  COMPREHENSIVE METABOLIC PANEL - Abnormal; Notable for the following:    Sodium 134 (*)    All other components within normal limits    Imaging Review No results found.   EKG Interpretation None      MDM   Final diagnoses:  Abdominal pain, unspecified  abdominal location     Labs: CBC, CMP significant findings  Imaging: Recent CT show a small periumbilical hernia with an area diminutive fat  Consults: None  Therapeutics: Hydrocodone  Assessment/Plan: Patient reports no changes in the pain to her periumbilical area reports she has run out of her pain medication that was controlling the pain. He is not followed up with anybody at this time. With no acute changes and a recent CT scan no additional evaluation or studies is needed at this time. Patient was given pain medication in the emergency room with improvement of symptoms. She was instructed to contact her general surgeon tomorrow morning for  further evaluation and management this condition. Patient understood and agreed to this plan. She was given instructions to monitor for worsening signs or symptoms to return if any present.   Okey Regal, PA-C 07/10/14 0155  Quintella Reichert, MD 07/10/14 [id number]

## 2014-07-09 NOTE — ED Notes (Signed)
Patient has arrived to room; given gown to undress.

## 2014-07-09 NOTE — Discharge Instructions (Signed)
Abdominal Pain Many things can cause abdominal pain. Usually, abdominal pain is not caused by a disease and will improve without treatment. It can often be observed and treated at home. Your health care provider will do a physical exam and possibly order blood tests and X-rays to help determine the seriousness of your pain. However, in many cases, more time must pass before a clear cause of the pain can be found. Before that point, your health care provider may not know if you need more testing or further treatment. HOME CARE INSTRUCTIONS  Monitor your abdominal pain for any changes. The following actions may help to alleviate any discomfort you are experiencing:  Only take over-the-counter or prescription medicines as directed by your health care provider.  Do not take laxatives unless directed to do so by your health care provider.  Try a clear liquid diet (broth, tea, or water) as directed by your health care provider. Slowly move to a bland diet as tolerated. SEEK MEDICAL CARE IF:  You have unexplained abdominal pain.  You have abdominal pain associated with nausea or diarrhea.  You have pain when you urinate or have a bowel movement.  You experience abdominal pain that wakes you in the night.  You have abdominal pain that is worsened or improved by eating food.  You have abdominal pain that is worsened with eating fatty foods.  You have a fever. SEEK IMMEDIATE MEDICAL CARE IF:   Your pain does not go away within 2 hours.  You keep throwing up (vomiting).  Your pain is felt only in portions of the abdomen, such as the right side or the left lower portion of the abdomen.  You pass bloody or black tarry stools. MAKE SURE YOU:  Understand these instructions.   Will watch your condition.   Will get help right away if you are not doing well or get worse.  Document Released: 01/01/2005 Document Revised: 03/29/2013 Document Reviewed: 12/01/2012 [facility] Patient Information  2015 [place], [place]. This information is not intended to replace advice given to you by your health care provider. Make sure you discuss any questions you have with your health care provider. Please follow-up your primary care provider and surgeon on Monday for further evaluation and management. Worsening signs or symptoms and seek evaluation if any present. Improvement Tylenol can be used for the pain.

## 2014-07-09 NOTE — ED Notes (Signed)
Pt c/o abdominal pain x 2 weeks. Pt seen here for same, told she had hernia, out of pain medication. Pt was washing her hair in symptoms became worse.

## 2014-07-10 ENCOUNTER — Other Ambulatory Visit: Payer: Self-pay | Admitting: General Surgery

## 2014-07-10 DIAGNOSIS — N8320 Unspecified ovarian cysts: Secondary | ICD-10-CM | POA: Diagnosis not present

## 2014-07-10 DIAGNOSIS — N801 Endometriosis of ovary: Secondary | ICD-10-CM | POA: Diagnosis not present

## 2014-07-10 DIAGNOSIS — K469 Unspecified abdominal hernia without obstruction or gangrene: Secondary | ICD-10-CM | POA: Diagnosis not present

## 2014-07-10 DIAGNOSIS — F419 Anxiety disorder, unspecified: Secondary | ICD-10-CM | POA: Diagnosis not present

## 2014-07-10 DIAGNOSIS — N6310 Unspecified lump in the right breast, unspecified quadrant: Secondary | ICD-10-CM

## 2014-07-11 ENCOUNTER — Other Ambulatory Visit: Payer: Self-pay | Admitting: *Deleted

## 2014-07-14 ENCOUNTER — Ambulatory Visit
Admission: RE | Admit: 2014-07-14 | Discharge: 2014-07-14 | Disposition: A | Discharge status: Home / Self Care | Payer: Medicare Other | Source: Ambulatory Visit | Attending: General Surgery | Admitting: General Surgery

## 2014-07-14 DIAGNOSIS — N6011 Diffuse cystic mastopathy of right breast: Secondary | ICD-10-CM | POA: Diagnosis not present

## 2014-07-14 DIAGNOSIS — N63 Unspecified lump in breast: Secondary | ICD-10-CM | POA: Diagnosis not present

## 2014-07-14 DIAGNOSIS — N6310 Unspecified lump in the right breast, unspecified quadrant: Secondary | ICD-10-CM

## 2014-07-15 DIAGNOSIS — F3131 Bipolar disorder, current episode depressed, mild: Secondary | ICD-10-CM | POA: Diagnosis not present

## 2014-07-15 DIAGNOSIS — F4312 Post-traumatic stress disorder, chronic: Secondary | ICD-10-CM | POA: Diagnosis not present

## 2014-07-15 DIAGNOSIS — F29 Unspecified psychosis not due to a substance or known physiological condition: Secondary | ICD-10-CM | POA: Diagnosis not present

## 2014-07-24 ENCOUNTER — Encounter (HOSPITAL_COMMUNITY): Payer: Self-pay | Admitting: *Deleted

## 2014-07-24 ENCOUNTER — Inpatient Hospital Stay (HOSPITAL_COMMUNITY): Payer: Medicare Other

## 2014-07-24 ENCOUNTER — Inpatient Hospital Stay (HOSPITAL_COMMUNITY)
Admission: AD | Admit: 2014-07-24 | Discharge: 2014-07-25 | Disposition: A | Discharge status: Home / Self Care | Payer: Medicare Other | Source: Ambulatory Visit | Attending: Obstetrics and Gynecology | Admitting: Obstetrics and Gynecology

## 2014-07-24 DIAGNOSIS — R102 Pelvic and perineal pain: Secondary | ICD-10-CM | POA: Insufficient documentation

## 2014-07-24 DIAGNOSIS — R1030 Lower abdominal pain, unspecified: Secondary | ICD-10-CM | POA: Insufficient documentation

## 2014-07-24 DIAGNOSIS — F17210 Nicotine dependence, cigarettes, uncomplicated: Secondary | ICD-10-CM | POA: Diagnosis not present

## 2014-07-24 DIAGNOSIS — R1031 Right lower quadrant pain: Secondary | ICD-10-CM | POA: Diagnosis not present

## 2014-07-24 DIAGNOSIS — N801 Endometriosis of ovary: Secondary | ICD-10-CM | POA: Insufficient documentation

## 2014-07-24 DIAGNOSIS — N80129 Deep endometriosis of ovary, unspecified ovary: Secondary | ICD-10-CM

## 2014-07-24 DIAGNOSIS — N8320 Unspecified ovarian cysts: Secondary | ICD-10-CM | POA: Diagnosis not present

## 2014-07-24 DIAGNOSIS — N83201 Unspecified ovarian cyst, right side: Secondary | ICD-10-CM

## 2014-07-24 NOTE — MAU Note (Addendum)
SAYS HAD SURGERY [facility] IN FEB- BUT PUT ON HOLD  BECAUSE OF URI.    HAD CT SCAN IN MARCH    HAD MAMMOGRAM IN April    HAS BEEN DOING  YOGA.      FEELS PAIN   TOOK IBUPROFEN  AND TYLENOL  AT 8 PM    HAS APPOINTMENT  WITH DR HOXWORTH  ABOUT [place].

## 2014-07-24 NOTE — MAU Provider Note (Signed)
History      Pt is in tonight c/o mod to severe right lower quad pain after doing yoga. 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  CSN: [id number]  Arrival date and time: 07/24/14 2142   None     No chief complaint on file.  HPI Comments: Catherine Olsen is a 44 y.o. G2P0020 who presents lower abdominal pain. She has had this pain before. However, tonight after yoga the pain became much worse.   Pelvic Pain The patient's primary symptoms include pelvic pain. This is a new problem. The current episode started today. The problem has been unchanged. The pain is severe. The problem affects the right side. She is pregnant. Associated symptoms include abdominal pain. The symptoms are aggravated by activity. She has tried nothing for the symptoms. She is sexually active. She uses nothing for contraception.    Pertinent Gynecological History: Menses: usually lasting less than 6 days Bleeding: none Contraception: condoms DES exposure: unknown Blood transfusions: none Sexually transmitted diseases: no past history Previous GYN Procedures: none  Last mammogram: normal Date: 4/16 Last pap: normal Date: 3/16   Past Medical History  Diagnosis Date  . Asthma   . Anxiety   . Mental disorder   . Bipolar 1 disorder   . Endometriosis   . PONV (postoperative nausea and vomiting)   . Termination of pregnancy     x 2 at age 65 and 45 yrs old  . Depression   . Headache(784.0)     otc med prn  . Arthritis     hands, lower back, knees  . Fibromyalgia   . Scoliosis   . Scoliosis   . Allergy     Past Surgical History  Procedure Laterality Date  . Nose surgery      rhinoplasty at age 60 yrs  . Wisdom tooth extraction    . Facial cosmetic surgery      right cheek  . Laparoscopy Right 11/11/2013    Procedure: LAPAROSCOPY OPERATIVE with  Drainage of  RIGHT Ovarian ENDOMETRIOMA;  Surgeon: Elveria Royals, MD;  Location: [facility];  Service: Gynecology;   Laterality: Right;  . Dilation and curettage of uterus    . Breast surgery      Family History  Problem Relation Age of Onset  . Diabetes Mother   . Hypertension Mother   . Stroke Mother   . Mental illness Mother   . Heart disease Father   . Hyperlipidemia Father   . Hypertension Father   . Diabetes Maternal Grandmother   . Heart disease Maternal Grandmother   . Hyperlipidemia Maternal Grandmother   . Hypertension Maternal Grandmother   . Mental illness Maternal Grandmother   . Heart disease Maternal Grandfather   . Hyperlipidemia Maternal Grandfather   . Hypertension Maternal Grandfather   . Heart disease Paternal Grandfather   . Hyperlipidemia Paternal Grandfather   . Hypertension Paternal Grandfather   . Stroke Paternal Grandfather     History  Substance Use Topics  . Smoking status: Current Every Day Smoker -- 29 years    Types: Cigarettes, E-cigarettes  . Smokeless tobacco: Never Used  . Alcohol Use: No     Comment: "rare"    Allergies:  Allergies  Allergen Reactions  . Sulfamethoxazole Anaphylaxis  . Oxycodone Other (See Comments)    Severe blindness?  . Other Anxiety    steroids  . Percocet [Oxycodone-Acetaminophen] Palpitations  . Sulfa Antibiotics Hives  .  Sulfites Other (See Comments)    unknown    Prescriptions prior to admission  Medication Sig Dispense Refill Last Dose  . amoxicillin-clavulanate (AUGMENTIN) 875-125 MG per tablet Take 1 tablet by mouth every 12 (twelve) hours. 20 tablet 0 Taking  . ARIPiprazole (ABILIFY) 5 MG tablet Take 5 mg by mouth daily.     . clonazePAM (KLONOPIN) 2 MG tablet Take 4 mg by mouth at bedtime as needed. For sleep  1   . diphenhydrAMINE (BENADRYL) 25 MG tablet Take 50-100 mg by mouth every 8 (eight) hours as needed for itching, allergies or sleep.    Taking  . EPINEPHrine (EPIPEN 2-PAK) 0.3 mg/0.3 mL IJ SOAJ injection Inject 0.3 mLs (0.3 mg total) into the muscle once. 1 Device 0 Taking  .  HYDROcodone-acetaminophen (NORCO) 5-325 MG per tablet Take 1 tablet by mouth every 6 (six) hours as needed. 10 tablet 0   . ibuprofen (ADVIL,MOTRIN) 800 MG tablet Take 1 tablet (800 mg total) by mouth 3 (three) times daily. 21 tablet 0 Taking  . Magnesium 250 MG TABS Take 250 mg by mouth daily.    Taking  . Multiple Vitamin (MULTIVITAMIN WITH MINERALS) TABS tablet Take 1 tablet by mouth daily.   Taking  . nicotine (NICODERM CQ - DOSED IN MG/24 HOURS) 21 mg/24hr patch   1     Review of Systems  Constitutional: Negative.   HENT: Negative.   Eyes: Negative.   Respiratory: Negative.   Cardiovascular: Negative.   Gastrointestinal: Positive for abdominal pain.  Genitourinary: Positive for pelvic pain.  Musculoskeletal: Negative.   Skin: Negative.   Neurological: Negative.   Endo/Heme/Allergies: Negative.   Psychiatric/Behavioral: Negative.    Physical Exam   Last menstrual period 06/29/2014.  Physical Exam  Constitutional: She is oriented to person, place, and time. She appears well-developed and well-nourished.  HENT:  Head: Normocephalic.  Eyes: Pupils are equal, round, and reactive to light.  Neck: Normal range of motion.  Cardiovascular: Normal rate, regular rhythm, normal heart sounds and intact distal pulses.   Respiratory: Effort normal and breath sounds normal.  GI: Soft. Bowel sounds are normal. There is tenderness.  Genitourinary: Uterus normal.  Musculoskeletal: Normal range of motion.  Neurological: She is alert and oriented to person, place, and time. She has normal reflexes.  Skin: Skin is warm and dry.  Psychiatric: She has a normal mood and affect. Her behavior is normal. Judgment and thought content normal.   US Transvaginal Non-ob  07/25/2014   CLINICAL DATA:  Right lower quadrant pain, history of right ovarian endometrioma.  EXAM: TRANSABDOMINAL AND TRANSVAGINAL ULTRASOUND OF PELVIS  TECHNIQUE: Both transabdominal and transvaginal ultrasound examinations of the  pelvis were performed. Transabdominal technique was performed for global imaging of the pelvis including uterus, ovaries, adnexal regions, and pelvic cul-de-sac. It was necessary to proceed with endovaginal exam following the transabdominal exam to visualize the endometrium and adnexa.  COMPARISON:  07/06/2014 CT, 02/08/2014 ultrasound  FINDINGS: Uterus  Measurements: 8.3 x 4.1 x 4.8 cm. Heterogeneous echogenicity. 1.5 x 0.7 x 1.2 cm intramural fibroid along the anterior fundus.  Endometrium  Thickness: 6 mm.  No focal abnormality visualized.  Right ovary  Measurements: 5.6 x 4.3 x 5.7 cm. Homogeneously hypoechoic cyst with diffuse low-level echoes, measuring 5.0 x 3.1 x 4.6 cm  Left ovary  Measurements: 4.2 x 3.4 x 3.6 cm. Mixed echogenicity/complex cyst measuring 3.5 x 2.4 x 3.5 cm.  Other findings  No free fluid. Doppler/spectral assessment of the ovaries  was not performed.  IMPRESSION: 5.0 cm right ovarian endometrioma (favored) versus hemorrhagic cyst.  Interval 3.5 cm complex left ovarian lesion may reflect a hemorrhagic cyst. Recommend 6 week ultrasound follow-up.   Electronically Signed   By: Carlos Levering M.D.   On: 07/25/2014 01:09   US Pelvis Complete  07/25/2014   CLINICAL DATA:  Right lower quadrant pain, history of right ovarian endometrioma.  EXAM: TRANSABDOMINAL AND TRANSVAGINAL ULTRASOUND OF PELVIS  TECHNIQUE: Both transabdominal and transvaginal ultrasound examinations of the pelvis were performed. Transabdominal technique was performed for global imaging of the pelvis including uterus, ovaries, adnexal regions, and pelvic cul-de-sac. It was necessary to proceed with endovaginal exam following the transabdominal exam to visualize the endometrium and adnexa.  COMPARISON:  07/06/2014 CT, 02/08/2014 ultrasound  FINDINGS: Uterus  Measurements: 8.3 x 4.1 x 4.8 cm. Heterogeneous echogenicity. 1.5 x 0.7 x 1.2 cm intramural fibroid along the anterior fundus.  Endometrium  Thickness: 6 mm.  No focal  abnormality visualized.  Right ovary  Measurements: 5.6 x 4.3 x 5.7 cm. Homogeneously hypoechoic cyst with diffuse low-level echoes, measuring 5.0 x 3.1 x 4.6 cm  Left ovary  Measurements: 4.2 x 3.4 x 3.6 cm. Mixed echogenicity/complex cyst measuring 3.5 x 2.4 x 3.5 cm.  Other findings  No free fluid. Doppler/spectral assessment of the ovaries was not performed.  IMPRESSION: 5.0 cm right ovarian endometrioma (favored) versus hemorrhagic cyst.  Interval 3.5 cm complex left ovarian lesion may reflect a hemorrhagic cyst. Recommend 6 week ultrasound follow-up.   Electronically Signed   By: Carlos Levering M.D.   On: 07/25/2014 01:09    MAU Course  Procedures  MDM Right lower quad pain of long standing origen   Assessment and Plan  Pelvic u/s to r/o torsion. Encouraged pt to make decision regarding removal of lesion since this is a long standing problem. D/c home if no torsion.  1. Lower abdominal pain   2. Cyst of right ovary   3. Endometrioma of ovary    DC home RX  Return to MAU as needed Continue ibuprofen PRN Heating pad to area PRN  Follow-up Information    Schedule an appointment as soon as possible for a visit with Donneta Romberg, MD.   Specialty:  Obstetrics and Gynecology   Contact information:   [place] [address] [phone]      Mathis Bud 1:41 AM 07/25/2014    Koren Shiver DARLENE 07/24/2014, 10:10 PM

## 2014-07-24 NOTE — MAU Note (Signed)
SAYS CANNOT  COLLECT  URINE

## 2014-07-25 ENCOUNTER — Ambulatory Visit (INDEPENDENT_AMBULATORY_CARE_PROVIDER_SITE_OTHER): Payer: Medicare Other | Admitting: Family Medicine

## 2014-07-25 ENCOUNTER — Telehealth: Payer: Self-pay | Admitting: *Deleted

## 2014-07-25 VITALS — BP 112/68 | HR 76 | Temp 98.1°F | Resp 12 | Ht 68.5 in | Wt 134.1 lb

## 2014-07-25 DIAGNOSIS — R1031 Right lower quadrant pain: Secondary | ICD-10-CM | POA: Diagnosis not present

## 2014-07-25 DIAGNOSIS — N80129 Deep endometriosis of ovary, unspecified ovary: Secondary | ICD-10-CM

## 2014-07-25 DIAGNOSIS — D352 Benign neoplasm of pituitary gland: Secondary | ICD-10-CM

## 2014-07-25 DIAGNOSIS — N83202 Unspecified ovarian cyst, left side: Secondary | ICD-10-CM

## 2014-07-25 DIAGNOSIS — G43009 Migraine without aura, not intractable, without status migrainosus: Secondary | ICD-10-CM

## 2014-07-25 DIAGNOSIS — N801 Endometriosis of ovary: Secondary | ICD-10-CM

## 2014-07-25 DIAGNOSIS — R102 Pelvic and perineal pain: Secondary | ICD-10-CM | POA: Diagnosis not present

## 2014-07-25 DIAGNOSIS — F3160 Bipolar disorder, current episode mixed, unspecified: Secondary | ICD-10-CM

## 2014-07-25 DIAGNOSIS — N8320 Unspecified ovarian cysts: Secondary | ICD-10-CM

## 2014-07-25 DIAGNOSIS — M7662 Achilles tendinitis, left leg: Secondary | ICD-10-CM

## 2014-07-25 DIAGNOSIS — M2662 Arthralgia of temporomandibular joint: Secondary | ICD-10-CM | POA: Diagnosis not present

## 2014-07-25 DIAGNOSIS — M26629 Arthralgia of temporomandibular joint, unspecified side: Secondary | ICD-10-CM

## 2014-07-25 MED ORDER — IBUPROFEN 800 MG PO TABS
800.0000 mg | ORAL_TABLET | Freq: Once | ORAL | Status: AC
  Administered 2014-07-25: 800 mg via ORAL
  Filled 2014-07-25: qty 1

## 2014-07-25 MED ORDER — HYDROCODONE-ACETAMINOPHEN 5-325 MG PO TABS: 1.0000 | ORAL_TABLET | Freq: Four times a day (QID) | ORAL | Status: DC | PRN

## 2014-07-25 NOTE — Telephone Encounter (Signed)
Patient called after discharge. She states that she can't see her [place] results in My Chart at this time. Reviewed with the patient that it can take 24-48 hours for results to show up in [place]. Patient also states that she would like to know the name of the MD that is here tonight, and she wants [place] to know that she would like to file a complaint. Discussed with the patient that there is information on the [facility] website for filing a complaint, and she can follow those instructions. I apologized to the patient that she was not satisfied with her care. She states that she is planning on going to [facility] at this time. On review of her chart, it was noted that she was given 60 Vicodin on 07/10/14, and was scheduled for surgery for ovarian cyst and hernia.

## 2014-07-25 NOTE — Discharge Instructions (Signed)
Ovarian Cyst An ovarian cyst is a fluid-filled sac that forms on an ovary. The ovaries are small organs that produce eggs in women. Various types of cysts can form on the ovaries. Most are not cancerous. Many do not cause problems, and they often go away on their own. Some may cause symptoms and require treatment. Common types of ovarian cysts include:  Functional cysts--These cysts may occur every month during the menstrual cycle. This is normal. The cysts usually go away with the next menstrual cycle if the woman does not get pregnant. Usually, there are no symptoms with a functional cyst.  Endometrioma cysts--These cysts form from the tissue that lines the uterus. They are also called "chocolate cysts" because they become filled with blood that turns brown. This type of cyst can cause pain in the lower abdomen during intercourse and with your menstrual period.  Cystadenoma cysts--This type develops from the cells on the outside of the ovary. These cysts can get very big and cause lower abdomen pain and pain with intercourse. This type of cyst can twist on itself, cut off its blood supply, and cause severe pain. It can also easily rupture and cause a lot of pain.  Dermoid cysts--This type of cyst is sometimes found in both ovaries. These cysts may contain different kinds of body tissue, such as skin, teeth, hair, or cartilage. They usually do not cause symptoms unless they get very big.  Theca lutein cysts--These cysts occur when too much of a certain hormone (human chorionic gonadotropin) is produced and overstimulates the ovaries to produce an egg. This is most common after procedures used to assist with the conception of a baby (in vitro fertilization). CAUSES   Fertility drugs can cause a condition in which multiple large cysts are formed on the ovaries. This is called ovarian hyperstimulation syndrome.  A condition called polycystic ovary syndrome can cause hormonal imbalances that can lead to  nonfunctional ovarian cysts. SIGNS AND SYMPTOMS  Many ovarian cysts do not cause symptoms. If symptoms are present, they may include:  Pelvic pain or pressure.  Pain in the lower abdomen.  Pain during sexual intercourse.  Increasing girth (swelling) of the abdomen.  Abnormal menstrual periods.  Increasing pain with menstrual periods.  Stopping having menstrual periods without being pregnant. DIAGNOSIS  These cysts are commonly found during a routine or annual pelvic exam. Tests may be ordered to find out more about the cyst. These tests may include:  Ultrasound.  X-ray of the pelvis.  CT scan.  MRI.  Blood tests. TREATMENT  Many ovarian cysts go away on their own without treatment. Your health care provider may want to check your cyst regularly for 2-3 months to see if it changes. For women in menopause, it is particularly important to monitor a cyst closely because of the higher rate of ovarian cancer in menopausal women. When treatment is needed, it may include any of the following:  A procedure to drain the cyst (aspiration). This may be done using a long needle and ultrasound. It can also be done through a laparoscopic procedure. This involves using a thin, lighted tube with a tiny camera on the end (laparoscope) inserted through a small incision.  Surgery to remove the whole cyst. This may be done using laparoscopic surgery or an open surgery involving a larger incision in the lower abdomen.  Hormone treatment or birth control pills. These methods are sometimes used to help dissolve a cyst. HOME CARE INSTRUCTIONS   Only take over-the-counter   or prescription medicines as directed by your health care provider.  Follow up with your health care provider as directed.  Get regular pelvic exams and Pap tests. SEEK MEDICAL CARE IF:   Your periods are late, irregular, or painful, or they stop.  Your pelvic pain or abdominal pain does not go away.  Your abdomen becomes  larger or swollen.  You have pressure on your bladder or trouble emptying your bladder completely.  You have pain during sexual intercourse.  You have feelings of fullness, pressure, or discomfort in your stomach.  You lose weight for no apparent reason.  You feel generally ill.  You become constipated.  You lose your appetite.  You develop acne.  You have an increase in body and facial hair.  You are gaining weight, without changing your exercise and eating habits.  You think you are pregnant. SEEK IMMEDIATE MEDICAL CARE IF:   You have increasing abdominal pain.  You feel sick to your stomach (nauseous), and you throw up (vomit).  You develop a fever that comes on suddenly.  You have abdominal pain during a bowel movement.  Your menstrual periods become heavier than usual. MAKE SURE YOU:  Understand these instructions.  Will watch your condition.  Will get help right away if you are not doing well or get worse. Document Released: 03/24/2005 Document Revised: 03/29/2013 Document Reviewed: 11/29/2012 Exit[employer] Patient Information 2015 [employer]. This information is not intended to replace advice given to you by your health care provider. Make sure you discuss any questions you have with your health care provider.  

## 2014-07-25 NOTE — Progress Notes (Signed)
Subjective:    Patient ID: Catherine Olsen, female    DOB: [birth year], 44 y.o.   MRN: [id number]  07/25/2014  Abdominal Pain; Nausea; Headache; and Depression   HPI This 44 y.o. female presents for evaluation of the following:  1.  R ovarian endometrioma:  S/p evaluation at [facility] yesterday.  Performing yoga yesterday and doing certain yoga move with acute onset of pelvic pain.  S/p pelvic US yesterday; cyst decreased in size to 5.0cm.  Walking six miles per day.  Needing pain medication intermittently; was taking excessive amounts of Ibuprofen and Tylenol.  Does not want to rush into surgery since it is shrinking.  Does not want to rush into surgery.  Wants to avoid surgery all together.  Surgery in 11/2013; came back and was 7.7cm in 02/2014; started shrinking after that. Started eating dairy again with increase in size.  On disability and has food stamps.  Does not eat meat; has recently started avoiding dairy.  Reproductive endocrinology to perform resection; did [place] in [place] on 07/10/14; smaller at that time but physician questioned that it is smaller.  Discussed at that visit, to wait six months to see size of R ovarian endometrioma.  Did two inverted postures.  No torsion on [place] last night.  Menses have been regular for past three months.  Had six weeks of bleeding after surgery in 12/2013. Bleeding lasted until stopped eating dairy.  Hydorocodone #60 on 07/10/14.  Lasted eight days.  Severity 7/10.  Ran out of hydrocodone on 07/18/14.    2.  L jaw painful: onset this morning; grinds teeth since teenage years; noticed grinding last night at [facility] with stress.    3.  Tendons sore L:  Onset today; chronic recurrent issue.  Has undergone evaluation by rheumatology in past; labs negative in past. Onset of tendon soreness this morning. Has been doing same exercise regimen for a while now.  Gets inflammation with stress; no good explanation.  Tired of seeing physicians.    4.  Headaches/migraines:  Horrible  headaches "my entire life".  Since summer had horrible headaches again since 10/2013; does have pituitary tumor; also causing vision issues. Followed by endocrinology at [facility].  Having MRI read again at [facility].  Had severe headache and nausea during [facility] visit last night.  +nausea, +sensitivity to light; +blurred vision.  +dizziness.  No n/t/w.  +photophobia; +phonophobia.  Cluster headaches as child.  Headaches stopped when 16.  Rate 7-8/10.  Getting headaches twice monthly; duration 3-4 days on average.  Hydrocodone helps but does not work.  Cannot take Percocet.  Needs to have eye exam but does not want to have both eyes dilated.  No previous trial of Imitrex, Maxalt, Relpax; has never heard of these medications.   5.  Breast tenderness B; s/p recent mammogram and ultrasound; s/p several biopsies in past.  Scheduled for repeat breast imaging in six months.    6. Bipolar disorder:  Does not drive highways so family member must drive her to [facility].  Psychiatrist feels that headaches and symptoms organic and not psychiatric related.   7. Hernias x 2: needs these repaired.  Had flu and noticed swelling in abdomen.  Evaluated in ED.     Review of Systems  Constitutional: Negative for fever, chills, diaphoresis and fatigue.  Eyes: Positive for photophobia and visual disturbance.  Respiratory: Negative for cough and shortness of breath.   Cardiovascular: Negative for chest pain, palpitations and leg swelling.  Gastrointestinal:  Positive for nausea. Negative for vomiting, abdominal pain, diarrhea and constipation.  Endocrine: Negative for cold intolerance, heat intolerance, polydipsia, polyphagia and polyuria.  Genitourinary: Positive for pelvic pain. Negative for dysuria, urgency, frequency, hematuria, flank pain, genital sores and menstrual problem.  Musculoskeletal: Positive for myalgias and arthralgias. Negative for joint swelling.  Skin: Negative for wound.  Neurological: Positive for  headaches. Negative for dizziness, tremors, seizures, syncope, facial asymmetry, speech difficulty, weakness, light-headedness and numbness.  Psychiatric/Behavioral: The patient is nervous/anxious.     Past Medical History  Diagnosis Date  . Asthma   . Anxiety   . Mental disorder   . Bipolar 1 disorder   . Endometriosis   . PONV (postoperative nausea and vomiting)   . Termination of pregnancy     x 2 at age 5 and 45 yrs old  . Depression   . Headache(784.0)     otc med prn  . Arthritis     hands, lower back, knees  . Fibromyalgia   . Scoliosis   . Scoliosis   . Allergy    Past Surgical History  Procedure Laterality Date  . Nose surgery      rhinoplasty at age 73 yrs  . Wisdom tooth extraction    . Facial cosmetic surgery      right cheek  . Laparoscopy Right 11/11/2013    Procedure: LAPAROSCOPY OPERATIVE with  Drainage of  RIGHT Ovarian ENDOMETRIOMA;  Surgeon: Elveria Royals, MD;  Location: [facility];  Service: Gynecology;  Laterality: Right;  . Dilation and curettage of uterus    . Breast surgery     Allergies  Allergen Reactions  . Sulfamethoxazole Anaphylaxis  . Oxycodone Other (See Comments)    Severe blindness?  . Other Anxiety    steroids  . Percocet [Oxycodone-Acetaminophen] Palpitations  . Sulfa Antibiotics Hives  . Sulfites Other (See Comments)    unknown   Current Outpatient Prescriptions  Medication Sig Dispense Refill  . clonazePAM (KLONOPIN) 2 MG tablet Take 4 mg by mouth at bedtime as needed. For sleep  1  . diphenhydrAMINE (BENADRYL) 25 MG tablet Take 50-100 mg by mouth every 8 (eight) hours as needed for itching, allergies or sleep.     Marland Kitchen EPINEPHrine (EPIPEN 2-PAK) 0.3 mg/0.3 mL IJ SOAJ injection Inject 0.3 mLs (0.3 mg total) into the muscle once. 1 Device 0  . ibuprofen (ADVIL,MOTRIN) 800 MG tablet Take 1 tablet (800 mg total) by mouth 3 (three) times daily. 21 tablet 0  . Magnesium 250 MG TABS Take 250 mg by mouth daily.     . Multiple Vitamin  (MULTIVITAMIN WITH MINERALS) TABS tablet Take 1 tablet by mouth daily.    . ARIPiprazole (ABILIFY) 5 MG tablet Take 5 mg by mouth daily.    Marland Kitchen HYDROcodone-acetaminophen (NORCO) 5-325 MG per tablet Take 1 tablet by mouth every 6 (six) hours as needed for moderate pain. 60 tablet 0  . nicotine (NICODERM CQ - DOSED IN MG/24 HOURS) 21 mg/24hr patch   1   No current facility-administered medications for this visit.       acObjective:    BP 112/68 mmHg  Pulse 76  Temp(Src) 98.1 F (36.7 C) (Oral)  Resp 12  Ht 5' 8.5" (1.74 m)  Wt 134 lb 2 oz (60.839 kg)  BMI 20.09 kg/m2  SpO2 99%  LMP 06/29/2014 Physical Exam  Constitutional: She is oriented to person, place, and time. She appears well-developed and well-nourished. No distress.  HENT:  Head: Normocephalic and  atraumatic.  Right Ear: External ear normal.  Left Ear: External ear normal.  Nose: Nose normal.  Mouth/Throat: Oropharynx is clear and moist.  +TTP L TMJ region.  Full range of motion of TMJ L.  No popping.  Eyes: Conjunctivae and EOM are normal. Pupils are equal, round, and reactive to light.  Neck: Normal range of motion. Neck supple. Carotid bruit is not present. No thyromegaly present.  Cardiovascular: Normal rate, regular rhythm, normal heart sounds and intact distal pulses.  Exam reveals no gallop and no friction rub.   No murmur heard. Pulmonary/Chest: Effort normal and breath sounds normal. She has no wheezes. She has no rales.  Abdominal: Soft. Bowel sounds are normal. She exhibits no distension and no mass. There is no tenderness. There is no rebound and no guarding.  Musculoskeletal:       Left ankle: She exhibits normal range of motion, no swelling and no ecchymosis. No tenderness. No lateral malleolus and no medial malleolus tenderness found. Achilles tendon exhibits pain. Achilles tendon exhibits no defect.  Lymphadenopathy:    She has no cervical adenopathy.  Neurological: She is alert and oriented to person,  place, and time. No cranial nerve deficit. She exhibits normal muscle tone. Coordination normal.  Skin: Skin is warm and dry. No rash noted. She is not diaphoretic. No erythema. No pallor.  Psychiatric: Thought content normal. Her mood appears anxious. Her speech is rapid and/or pressured. She is aggressive. Cognition and memory are normal. She expresses impulsivity.        Assessment & Plan:   1. Ovarian cyst, left   2. Endometrioma of ovary   3. Migraine without aura and without status migrainosus, not intractable   4. TMJ arthralgia   5. Tendonitis, Achilles, left   6. Mixed bipolar I disorder   7. Pituitary adenoma    1. R ovarian endometrioma: persistent; did not undergo surgical resection as scheduled in 05/2014.  Repeat pelvic US yesterday with decrease in size from 7.5cm to 5.0 cm. Pt desires to observe for next six months yet continues to have intermittent pain.  Agree with refill Hydrocodone 5/325#60 to last one month.  Scheduled to follow up with surgeon in six months.  [place] controlled substance data base reviewed; filled hydrocodone #60 on 07/10/14; prior to 07/10/14 last rx for hydrocodone filled in 05/2014. 2.  L ovarian cyst: New.  Detected on pelvic US last night; repeat pelvic US in three months; follow-up here after pelvic US.   3.  Migraines: New/recurrent; having one every two weeks on average; duration of three days; on disability so not missing work.  Use hydrocodone for now considering pituitary adenoma. If migraines become more frequent, refer to neurologist to manage. 4.  L TMJ arthralgia: New. Secondary to grinding teeth; recommend rest, stress management, mouth guard.   5.  L Achilles tendonitis: New. Recommend rest, icing, stretching, decreased activity level. 6.  Bipolar disorder: anxiety is worsening due to acute health issues ;managed by psychiatry.   7.  Pituitary adenoma: stable; followed by [facility] endocrinology.    Meds ordered this encounter  Medications  .  HYDROcodone-acetaminophen (NORCO) 5-325 MG per tablet    Sig: Take 1 tablet by mouth every 6 (six) hours as needed for moderate pain.    Dispense:  60 tablet    Refill:  0    No Follow-up on file.     Kristi Elayne Guerin, M.D. [facility] [place], [place]  [address] [phone] phone [phone] fax

## 2014-07-25 NOTE — Telephone Encounter (Signed)
Was transferred this call from the registration staff as the patient was very unhappy and they were not able to help her.  The patient states she was seen in [facility] last night and she wasn't satisfied with her care.  She states she was seen by a nurse midwife and not a MD but we would probably file her insurance as if she saw a MD.  I explained her insurance would be filed by the provider who saw her.  She then wanted to know why Dr. Domenic Schwab name was printed on her AVS along with Marcille Buffy, CNM.  I explained Dr. Elly Modena was the supervising provider.  She wanted to know why Marcille Buffy didn't call Dr. Elly Modena to see her.  I explained I couldn't answer that as I didn't know what was going on when she was here.  She said she called earlier today and was told Dr. Elly Modena would be calling her before the end of the day.  When she hadn't heard from her she decided to call again and was then told Dr. Elly Modena wasn't here today.  Patient states no one has told her what is wrong.  States she asked questions about the size of her endometrioma last night and she didn't get a good answer.  Patient states she was told to follow-up with a doctor in [place] and that she has seen that doctor.  States that doctor is not her primary care provider and she will only do surgery if she decides she wants to do that.  I offered to have the covering provider to review her chart and see if there were any additional tests that might be needed to ensure what is the source of her pain or I could schedule her an appointment in our office.  She requested I speak with the provider and call her back.  I verified a good number to return her call.  I called the provider on call and spoke with Dr. Harolyn Rutherford who was covering for Dr. Ihor Dow while she was in surgery.  Dr. Harolyn Rutherford reviewed the patient's visit from last night and imaging and also reviewed notes through [place] and saw the plan from the visit at [facility].  Dr. Harolyn Rutherford  states there is nothing emergent that needs follow-up right now.  The patient's plan according to the notes from [facility] were for surgery.  She was last seen at [facility] on 07/10/14.  Dr. Harolyn Rutherford recommends the patient follow-up with the provider at [facility] since she has already established care there a has a plan of care.    I phoned the patient back and explained Dr. Arther Abbott recommendation and rationale.  The patient is still not happy with the plan.  She states this provider in [place] is not her primary care provider.  States she wants someone to evaluate her as she doesn't think that was done satisfactorily last night.  Patient states her pain started after she did yoga yesterday.  States she knows it is not muscular pain.  I told patient I would schedule her an appointment in our office if she would like.  Patient agreeable.  I asked if she had ever been seen in our office before.  She said she had and was seen by a female who said there was no one in our office who could take care of her condition.  She said he told her she would need to go to [place] or to [place].  States that is why she went to [place].  States she can't get an appointment there for several weeks and she has no transportation to get there.  I explained that if I schedule her an appointment there will not be anything we can do for her if the provider she saw told her we couldn't perform the surgery she needs.  I encouraged patient to follow up with her provider in [place].  Patient states again her reasons for not going there.  I told patient if she felt she needed to be seen emergently for evaluation she would need to go back to [facility] Admissions here at [facility] or go to one of the emergency departments at [facility] or [facility] or an urgent care.  Also suggested she could even go to [facility] to the emergency room.  Patient is still very unhappy.  States she will follow up with primary care or the emergency department.

## 2014-07-26 ENCOUNTER — Other Ambulatory Visit: Payer: Self-pay | Admitting: Family Medicine

## 2014-07-26 DIAGNOSIS — N83202 Unspecified ovarian cyst, left side: Secondary | ICD-10-CM

## 2014-07-26 NOTE — [facility]U Provider Note (Signed)
Addendum to [facility]U note: Patient is NOT pregnant. There is a typo in the note that states she is pregnant.  Mathis Bud

## 2014-08-07 ENCOUNTER — Telehealth: Payer: Self-pay

## 2014-08-07 DIAGNOSIS — N83209 Unspecified ovarian cyst, unspecified side: Secondary | ICD-10-CM

## 2014-08-07 DIAGNOSIS — N809 Endometriosis, unspecified: Secondary | ICD-10-CM

## 2014-08-07 NOTE — Telephone Encounter (Signed)
Pt would like a refill on HYDROcodone-acetaminophen (NORCO) 5-325 MG per tablet [[id number]]. Please advise at [phone]

## 2014-08-09 NOTE — Telephone Encounter (Signed)
Pt has not heard back from anyone and I told her the message had been sent to Dr. Marin Comment.  She says the message was supposed to have gone to Dr. Tamala Julian.  Please call [phone]

## 2014-08-09 NOTE — Telephone Encounter (Signed)
Dr Tamala Julian-- Please advise.

## 2014-08-10 MED ORDER — HYDROCODONE-ACETAMINOPHEN 5-325 MG PO TABS: 1.0000 | ORAL_TABLET | Freq: Three times a day (TID) | ORAL | Status: DC | PRN

## 2014-08-10 NOTE — Telephone Encounter (Signed)
Pt notified that rx is ready and per Dr Marin Comment, we cannot continue to fill this for her.

## 2014-08-10 NOTE — Telephone Encounter (Signed)
Please call her and let her know that she has 20 pills of the Norco. She needs to get this medication from her OB/GYN. This is apparently related to her ovarian cysts/endomteriosis.  If she wants me to refer her to a  Chronic pain specialist then we can do that but she really needs to be getting this medication from her OB/GYN or gyn surgeon at [place] controlled substance profile pulled. No illegal activities noted.

## 2014-08-17 ENCOUNTER — Ambulatory Visit (INDEPENDENT_AMBULATORY_CARE_PROVIDER_SITE_OTHER): Payer: Medicare Other | Admitting: Family Medicine

## 2014-08-17 VITALS — BP 100/70 | HR 77 | Temp 97.7°F | Resp 16 | Ht 69.0 in | Wt 132.0 lb

## 2014-08-17 DIAGNOSIS — F3131 Bipolar disorder, current episode depressed, mild: Secondary | ICD-10-CM | POA: Diagnosis not present

## 2014-08-17 DIAGNOSIS — N809 Endometriosis, unspecified: Secondary | ICD-10-CM

## 2014-08-17 DIAGNOSIS — N8320 Unspecified ovarian cysts: Secondary | ICD-10-CM

## 2014-08-17 DIAGNOSIS — F458 Other somatoform disorders: Secondary | ICD-10-CM | POA: Diagnosis not present

## 2014-08-17 DIAGNOSIS — F4312 Post-traumatic stress disorder, chronic: Secondary | ICD-10-CM | POA: Diagnosis not present

## 2014-08-17 DIAGNOSIS — N83209 Unspecified ovarian cyst, unspecified side: Secondary | ICD-10-CM

## 2014-08-17 DIAGNOSIS — R609 Edema, unspecified: Secondary | ICD-10-CM

## 2014-08-17 DIAGNOSIS — R220 Localized swelling, mass and lump, head: Secondary | ICD-10-CM

## 2014-08-17 DIAGNOSIS — K047 Periapical abscess without sinus: Secondary | ICD-10-CM

## 2014-08-17 MED ORDER — HYDROCODONE-ACETAMINOPHEN 5-325 MG PO TABS
1.0000 | ORAL_TABLET | Freq: Three times a day (TID) | ORAL | Status: DC | PRN
Start: 2014-08-17 — End: 2014-08-31

## 2014-08-17 MED ORDER — IBUPROFEN 800 MG PO TABS: 800.0000 mg | ORAL_TABLET | Freq: Three times a day (TID) | ORAL | Status: DC

## 2014-08-17 MED ORDER — CLINDAMYCIN HCL 150 MG PO CAPS: 150.0000 mg | ORAL_CAPSULE | Freq: Three times a day (TID) | ORAL | Status: DC

## 2014-08-17 NOTE — Patient Instructions (Signed)
1. Call dentist.   2. Return if swelling worsens.
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
## 2014-08-24 ENCOUNTER — Ambulatory Visit (INDEPENDENT_AMBULATORY_CARE_PROVIDER_SITE_OTHER): Payer: Medicare Other | Admitting: Family Medicine

## 2014-08-24 VITALS — BP 98/68 | HR 74 | Temp 97.7°F | Resp 18 | Ht 68.0 in | Wt 132.0 lb

## 2014-08-24 DIAGNOSIS — R220 Localized swelling, mass and lump, head: Secondary | ICD-10-CM

## 2014-08-24 DIAGNOSIS — K047 Periapical abscess without sinus: Secondary | ICD-10-CM

## 2014-08-24 MED ORDER — IBUPROFEN 800 MG PO TABS: 800.0000 mg | ORAL_TABLET | Freq: Three times a day (TID) | ORAL | Status: DC

## 2014-08-24 MED ORDER — CHLORHEXIDINE GLUCONATE 0.12 % MT SOLN: 15.0000 mL | Freq: Two times a day (BID) | OROMUCOSAL | Status: DC

## 2014-08-24 MED ORDER — CLINDAMYCIN HCL 150 MG PO CAPS: 300.0000 mg | ORAL_CAPSULE | Freq: Three times a day (TID) | ORAL | Status: DC

## 2014-08-24 NOTE — Progress Notes (Signed)
Subjective:    Patient ID: Catherine Olsen, female    DOB: 07/31/1970, 44 y.o.   MRN: [id number]  08/24/2014  Follow-up   HPI This 44 y.o. female presents for one week follow-up of dental and parotid swelling/inflammation.   Lower L cheek induration has improved.  Now having upper facial swelling.  Evaluated by dentist two days ago; would not clean teeth because it was too early.  No examination of gums on L during visit; no xrays by dentist.  Just had cleaning in January 2016. Now suffering with gum swelling B; last year was year of poor dental hygiene and now paying for it.  Dentist recommended ENT evaluation if concern of parotid process.  Tolerating Clindamycin 150mg  tid.  No side effects to Clindamycin.  Over the weekend, friend noticed neck swelling posterior.  Keeping infection at [place] but has not gone.    No fever/chills/sweats.  +nausea early.  No diarrhea; trying to eat three bowls of soup today because it is healthy for you.  Has not noticed swelling with eating.    Medicaid does not cover corrective procedures.   Taking hydrocodone as needed; has another remaining week of medication.  Taking Ibuprofen 800mg  tid with food.  Taking with abx.    Review of Systems  Constitutional: Negative for fever, chills, diaphoresis and fatigue.  HENT: Positive for dental problem, ear pain and facial swelling. Negative for congestion, postnasal drip, rhinorrhea, sinus pressure, sneezing, sore throat, trouble swallowing and voice change.   Respiratory: Negative for cough and shortness of breath.   Gastrointestinal: Positive for nausea. Negative for abdominal pain and diarrhea.  Skin: Negative for rash.    Past Medical History  Diagnosis Date  . Asthma   . Anxiety   . Mental disorder   . Bipolar 1 disorder   . Endometriosis   . PONV (postoperative nausea and vomiting)   . Termination of pregnancy     x 2 at age 59 and 44 yrs old  . Depression   . Headache(784.0)     otc med prn  .  Arthritis     hands, lower back, knees  . Fibromyalgia   . Scoliosis   . Scoliosis   . Allergy    Past Surgical History  Procedure Laterality Date  . Nose surgery      rhinoplasty at age 42 yrs  . Wisdom tooth extraction    . Facial cosmetic surgery      right cheek  . Laparoscopy Right 11/11/2013    Procedure: LAPAROSCOPY OPERATIVE with  Drainage of  RIGHT Ovarian ENDOMETRIOMA;  Surgeon: Elveria Royals, MD;  Location: [facility];  Service: Gynecology;  Laterality: Right;  . Dilation and curettage of uterus    . Breast surgery     Allergies  Allergen Reactions  . Sulfamethoxazole Anaphylaxis  . Oxycodone Other (See Comments)    Severe blindness?  . Penicillins Hives  . Other Anxiety    steroids  . Percocet [Oxycodone-Acetaminophen] Palpitations  . Sulfa Antibiotics Hives  . Sulfites Other (See Comments)    unknown   Current Outpatient Prescriptions  Medication Sig Dispense Refill  . ARIPiprazole (ABILIFY) 5 MG tablet Take 5 mg by mouth daily.    . clindamycin (CLEOCIN) 150 MG capsule Take 2 capsules (300 mg total) by mouth 3 (three) times daily. 42 capsule 0  . clonazePAM (KLONOPIN) 2 MG tablet Take 4 mg by mouth at bedtime as needed. For sleep  1  . diphenhydrAMINE (BENADRYL)  25 MG tablet Take 50-100 mg by mouth every 8 (eight) hours as needed for itching, allergies or sleep.     Marland Kitchen EPINEPHrine (EPIPEN 2-PAK) 0.3 mg/0.3 mL IJ SOAJ injection Inject 0.3 mLs (0.3 mg total) into the muscle once. 1 Device 0  . ibuprofen (ADVIL,MOTRIN) 800 MG tablet Take 1 tablet (800 mg total) by mouth 3 (three) times daily. 40 tablet 0  . Magnesium 250 MG TABS Take 250 mg by mouth daily.     . Multiple Vitamin (MULTIVITAMIN WITH MINERALS) TABS tablet Take 1 tablet by mouth daily.    . nicotine (NICODERM CQ - DOSED IN MG/24 HOURS) 21 mg/24hr patch   1  . chlorhexidine (PERIDEX) 0.12 % solution Use as directed 15 mLs in the mouth or throat 2 (two) times daily. 120 mL 3  . HYDROcodone-acetaminophen  (NORCO) 5-325 MG per tablet Take 1-1.5 tablets by mouth every 6 (six) hours as needed for moderate pain. 60 tablet 0  . predniSONE (DELTASONE) 20 MG tablet Two tablets daily x 5 days then one tablet daily x 5 days 15 tablet 0   No current facility-administered medications for this visit.   History   Social History  . Marital Status: Divorced    Spouse Name: N/A  . Number of Children: N/A  . Years of Education: N/A   Occupational History  . Not on file.   Social History Main Topics  . Smoking status: Current Every Day Smoker -- 29 years    Types: Cigarettes, E-cigarettes  . Smokeless tobacco: Never Used  . Alcohol Use: No     Comment: "rare"  . 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 Sexual Activity: No     Comment: abortion at 48 and 23yrs. of age    Other Topics Concern  . Not on file   Social History Narrative        Objective:    BP 98/68 mmHg  Pulse 74  Temp(Src) 97.7 F (36.5 C) (Oral)  Resp 18  Ht 5\' 8"  (1.727 m)  Wt 132 lb (59.875 kg)  BMI 20.08 kg/m2  SpO2 98%  LMP 08/17/2014 Physical Exam  Constitutional: She is oriented to person, place, and time. She appears well-developed and well-nourished. No distress.  HENT:  Head: Normocephalic and atraumatic.  Right Ear: External ear normal.  Left Ear: External ear normal.  Nose: Nose normal.  Mouth/Throat: Oropharynx is clear and moist. Abnormal dentition. Dental caries present. No dental abscesses. No oropharyngeal exudate.  L upper gum line with swelling.  Eyes: Conjunctivae are normal. Pupils are equal, round, and reactive to light.  Neck: Normal range of motion. Neck supple.  Cardiovascular: Normal rate, regular rhythm and normal heart sounds.  Exam reveals no gallop and no friction rub.   No murmur heard. Pulmonary/Chest: Effort normal and breath sounds normal. She has no wheezes. She has no rales.  Neurological: She is alert and oriented to  person, place, and time.  Skin: She is not diaphoretic.  Psychiatric: She has a normal mood and affect. Her behavior is normal.  Nursing note and vitals reviewed.       Assessment & Plan:   1. Facial swelling   2. Dental infection    -Persistent. -Rx for Clindamycin and Peridex provided. -Refill of Ibuprofen 800mg  tid provided. -Continue Hydrocodone for moderate to severe pain.  -Appointment with dentist in upcoming 10-14 days. -RTC for acute  worsening facial swelling, increasing pain, fever>100.5.   Meds ordered this encounter  Medications  . clindamycin (CLEOCIN) 150 MG capsule    Sig: Take 2 capsules (300 mg total) by mouth 3 (three) times daily.    Dispense:  42 capsule    Refill:  0  . chlorhexidine (PERIDEX) 0.12 % solution    Sig: Use as directed 15 mLs in the mouth or throat 2 (two) times daily.    Dispense:  120 mL    Refill:  3  . ibuprofen (ADVIL,MOTRIN) 800 MG tablet    Sig: Take 1 tablet (800 mg total) by mouth 3 (three) times daily.    Dispense:  40 tablet    Refill:  0    No Follow-up on file.     Kiyah Demartini Elayne Guerin, M.D. [facility] [address] [place], [place]  [address] [phone] phone [phone] fax

## 2014-08-24 NOTE — Patient Instructions (Signed)
1. Return for worsening facial swelling or development of fever.

## 2014-08-29 ENCOUNTER — Encounter (HOSPITAL_COMMUNITY): Payer: Self-pay | Admitting: Emergency Medicine

## 2014-08-29 ENCOUNTER — Telehealth: Payer: Self-pay

## 2014-08-29 ENCOUNTER — Emergency Department (HOSPITAL_COMMUNITY)
Admission: EM | Admit: 2014-08-29 | Discharge: 2014-08-29 | Disposition: A | Discharge status: Home / Self Care | Payer: Medicare Other | Attending: Emergency Medicine | Admitting: Emergency Medicine

## 2014-08-29 DIAGNOSIS — F419 Anxiety disorder, unspecified: Secondary | ICD-10-CM | POA: Diagnosis not present

## 2014-08-29 DIAGNOSIS — Z720 Tobacco use: Secondary | ICD-10-CM | POA: Diagnosis not present

## 2014-08-29 DIAGNOSIS — F319 Bipolar disorder, unspecified: Secondary | ICD-10-CM | POA: Insufficient documentation

## 2014-08-29 DIAGNOSIS — F99 Mental disorder, not otherwise specified: Secondary | ICD-10-CM | POA: Insufficient documentation

## 2014-08-29 DIAGNOSIS — Z79899 Other long term (current) drug therapy: Secondary | ICD-10-CM | POA: Diagnosis not present

## 2014-08-29 DIAGNOSIS — R591 Generalized enlarged lymph nodes: Secondary | ICD-10-CM

## 2014-08-29 DIAGNOSIS — M1990 Unspecified osteoarthritis, unspecified site: Secondary | ICD-10-CM | POA: Diagnosis not present

## 2014-08-29 DIAGNOSIS — K047 Periapical abscess without sinus: Secondary | ICD-10-CM | POA: Insufficient documentation

## 2014-08-29 DIAGNOSIS — J45909 Unspecified asthma, uncomplicated: Secondary | ICD-10-CM | POA: Diagnosis not present

## 2014-08-29 DIAGNOSIS — M419 Scoliosis, unspecified: Secondary | ICD-10-CM | POA: Diagnosis not present

## 2014-08-29 DIAGNOSIS — Z8742 Personal history of other diseases of the female genital tract: Secondary | ICD-10-CM | POA: Insufficient documentation

## 2014-08-29 DIAGNOSIS — R590 Localized enlarged lymph nodes: Secondary | ICD-10-CM | POA: Insufficient documentation

## 2014-08-29 DIAGNOSIS — Z792 Long term (current) use of antibiotics: Secondary | ICD-10-CM | POA: Insufficient documentation

## 2014-08-29 DIAGNOSIS — Z880 Allergy status to penicillin: Secondary | ICD-10-CM | POA: Diagnosis not present

## 2014-08-29 DIAGNOSIS — R220 Localized swelling, mass and lump, head: Secondary | ICD-10-CM | POA: Diagnosis present

## 2014-08-29 NOTE — ED Notes (Signed)
The patient said she noticed her "parotid gland" swelling about two weeks ago.  She noticed it before the tooth infection.  Her PCP treated her with antibiotics but she says "her parotid gland" is getting bigger instead of better.  She is not in pain, but she is concerned with swelling.

## 2014-08-29 NOTE — ED Provider Notes (Signed)
CSN: [id number]     Arrival date & time 08/29/14  5597 History  This chart was scribed for Delos Haring, PA, working with Daleen Bo, MD by Meriel Pica, ED Scribe. This paitent was seen in room TR06C/TR06C and the patient's care was started at 8:30 PM.    Chief Complaint  Patient presents with  . Oral Swelling    The patient said she noticed her "parotid gland" swelling about two weeks ago.  She noticed it before the tooth infection.  Her PCP treated her with antibiotics but she says "her parotid gland" is getting bigger instead of better.   The history is provided by the patient. No language interpreter was used.    HPI Comments: Catherine Olsen is a 44 y.o. female who presents to the Emergency Department complaining of progressively worsening, left-sided facial swelling that occurred 2 weeks ago. She reports that it is not painful currently but was painful at first. Patient has been seen multiple times by her PCP, Reginia Forts MD, who diagnosed her with parotid swelling and prescribed her 300mg  Clindamycin TID for a week, which she is compliant with, for a concurrent dental infection. Pt reports that the Clindamycin has relieved her dental infection but has not improved her parotid gland swelling. She also notes the appearance of similar hardened areas on her right cheek. PCP also advised diagnostic imaging with no improvement to her symptoms. Pt reports she has swollen lymph nodes at baseline. She denies fever, nausea, or vomiting.   PCP is Reginia Forts, MD.  Past Medical History  Diagnosis Date  . Asthma   . Anxiety   . Mental disorder   . Bipolar 1 disorder   . Endometriosis   . PONV (postoperative nausea and vomiting)   . Termination of pregnancy     x 2 at age 61 and 44 yrs old  . Depression   . Headache(784.0)     otc med prn  . Arthritis     hands, lower back, knees  . Fibromyalgia   . Scoliosis   . Scoliosis   . Allergy    Past Surgical History  Procedure Laterality  Date  . Nose surgery      rhinoplasty at age 84 yrs  . Wisdom tooth extraction    . Facial cosmetic surgery      right cheek  . Laparoscopy Right 11/11/2013    Procedure: LAPAROSCOPY OPERATIVE with  Drainage of  RIGHT Ovarian ENDOMETRIOMA;  Surgeon: Elveria Royals, MD;  Location: [facility];  Service: Gynecology;  Laterality: Right;  . Dilation and curettage of uterus    . Breast surgery     Family History  Problem Relation Age of Onset  . Diabetes Mother   . Hypertension Mother   . Stroke Mother   . Mental illness Mother   . Heart disease Father   . Hyperlipidemia Father   . Hypertension Father   . Diabetes Maternal Grandmother   . Heart disease Maternal Grandmother   . Hyperlipidemia Maternal Grandmother   . Hypertension Maternal Grandmother   . Mental illness Maternal Grandmother   . Heart disease Maternal Grandfather   . Hyperlipidemia Maternal Grandfather   . Hypertension Maternal Grandfather   . Heart disease Paternal Grandfather   . Hyperlipidemia Paternal Grandfather   . Hypertension Paternal Grandfather   . Stroke Paternal Grandfather    History  Substance Use Topics  . Smoking status: Current Every Day Smoker -- 29 years    Types: Cigarettes,  E-cigarettes  . Smokeless tobacco: Never Used  . Alcohol Use: No     Comment: "rare"   OB History    Gravida Para Term Preterm AB TAB SAB Ectopic Multiple Living   2    2 2     0     Review of Systems  Constitutional: Negative for fever.  HENT: Positive for dental problem and facial swelling.   Gastrointestinal: Negative for nausea and vomiting.  Musculoskeletal: Positive for arthralgias.  All other systems reviewed and are negative.  Allergies  Sulfamethoxazole; Oxycodone; Penicillins; Other; Percocet; Sulfa antibiotics; and Sulfites  Home Medications   Prior to Admission medications   Medication Sig Start Date End Date Taking? Authorizing Provider  ARIPiprazole (ABILIFY) 5 MG tablet Take 5 mg by mouth daily.  07/04/14   Historical Provider, MD  chlorhexidine (PERIDEX) 0.12 % solution Use as directed 15 mLs in the mouth or throat 2 (two) times daily. 08/24/14   Wardell Honour, MD  clindamycin (CLEOCIN) 150 MG capsule Take 2 capsules (300 mg total) by mouth 3 (three) times daily. 08/24/14   Wardell Honour, MD  clonazePAM (KLONOPIN) 2 MG tablet Take 4 mg by mouth at bedtime as needed. For sleep 06/28/14   Historical Provider, MD  diphenhydrAMINE (BENADRYL) 25 MG tablet Take 50-100 mg by mouth every 8 (eight) hours as needed for itching, allergies or sleep.     Historical Provider, MD  EPINEPHrine (EPIPEN 2-PAK) 0.3 mg/0.3 mL IJ SOAJ injection Inject 0.3 mLs (0.3 mg total) into the muscle once. 05/14/14   Elnora Morrison, MD  HYDROcodone-acetaminophen (NORCO) 5-325 MG per tablet Take 1 tablet by mouth every 8 (eight) hours as needed for moderate pain. 08/17/14   Wardell Honour, MD  ibuprofen (ADVIL,MOTRIN) 800 MG tablet Take 1 tablet (800 mg total) by mouth 3 (three) times daily. 08/24/14   Wardell Honour, MD  Magnesium 250 MG TABS Take 250 mg by mouth daily.     Historical Provider, MD  Multiple Vitamin (MULTIVITAMIN WITH MINERALS) TABS tablet Take 1 tablet by mouth daily.    Historical Provider, MD  nicotine (NICODERM CQ - DOSED IN MG/24 HOURS) 21 mg/24hr patch  06/18/14   Historical Provider, MD   BP 110/86 mmHg  Pulse 93  Temp(Src) 98.2 F (36.8 C) (Oral)  Resp 16  SpO2 98%  LMP 08/17/2014 Physical Exam  Constitutional: She appears well-developed and well-nourished.  HENT:  Head: Normocephalic and atraumatic.  Eyes: Conjunctivae are normal. Right eye exhibits no discharge. Left eye exhibits no discharge.  Pulmonary/Chest: Effort normal. No respiratory distress.  Neurological: She is alert. Coordination normal.  Skin: Skin is warm and dry. No rash noted. She is not diaphoretic. No erythema.  Psychiatric: She has a normal mood and affect.  Nursing note and vitals reviewed.   ED Course  Procedures   DIAGNOSTIC STUDIES: Oxygen Saturation is 98% on RA, normal by my interpretation.    COORDINATION OF CARE: 8:41 PM  Discussed with patient a treatment plan to get CT Soft Tissue Neck w Contrast. Pt acknowledges and agrees to course of treatment.  Patient does not want to wait for CT in the ER, so it was ordered as an outpatient, she see's her PCP on THursday and reports she will discuss this with them. PT has had some lymphadenopathy, has been on abx, anti-inflammatories and pain medication for a few weeks but is not getting better. No emergent findings to suggest imaging needs to be done today. Fine with patient  decision for outpatient, will discharge with instructions to call CT scheduler to schedule scan.  Labs Review Labs Reviewed - No data to display  Imaging Review No results found.   EKG Interpretation None      MDM   Final diagnoses:  Lymphadenopathy of head and neck   44 y.o.Catherine Olsen's evaluation in the Emergency Department is complete. It has been determined that no acute conditions requiring further emergency intervention are present at this time. The patient/guardian have been advised of the diagnosis and plan. We have discussed signs and symptoms that warrant return to the ED, such as changes or worsening in symptoms.  Vital signs are stable at discharge. Filed Vitals:   08/29/14 1913  BP: 110/86  Pulse: 93  Temp: 98.2 F (36.8 C)  Resp: 16    Patient/guardian has voiced understanding and agreed to follow-up with the PCP or specialist.  I personally performed the services described in this documentation, which was scribed in my presence. The recorded information has been reviewed and is accurate.     Delos Haring, PA-C 08/29/14 2103  Daleen Bo, MD 08/30/14 [phone]

## 2014-08-29 NOTE — ED Notes (Signed)
Pt. Left with all belongings and refused wheelchair 

## 2014-08-29 NOTE — Telephone Encounter (Signed)
Pt says her infection is spreading. She does not want to be seen by anybody but Dr Tamala Julian and she only wants Dr Tamala Julian to return her call

## 2014-08-30 ENCOUNTER — Telehealth: Payer: Self-pay

## 2014-08-30 ENCOUNTER — Other Ambulatory Visit (HOSPITAL_COMMUNITY): Payer: Self-pay | Admitting: Emergency Medicine

## 2014-08-30 ENCOUNTER — Ambulatory Visit (HOSPITAL_COMMUNITY)
Admission: RE | Admit: 2014-08-30 | Discharge: 2014-08-30 | Disposition: A | Discharge status: Home / Self Care | Payer: Medicare Other | Source: Ambulatory Visit | Attending: Emergency Medicine | Admitting: Emergency Medicine

## 2014-08-30 ENCOUNTER — Encounter: Payer: Self-pay | Admitting: Family Medicine

## 2014-08-30 ENCOUNTER — Encounter (HOSPITAL_COMMUNITY): Payer: Self-pay

## 2014-08-30 DIAGNOSIS — K047 Periapical abscess without sinus: Secondary | ICD-10-CM | POA: Diagnosis not present

## 2014-08-30 DIAGNOSIS — R220 Localized swelling, mass and lump, head: Secondary | ICD-10-CM

## 2014-08-30 DIAGNOSIS — K118 Other diseases of salivary glands: Secondary | ICD-10-CM | POA: Diagnosis not present

## 2014-08-30 MED ORDER — IOHEXOL 300 MG/ML  SOLN
75.0000 mL | Freq: Once | INTRAMUSCULAR | Status: AC | PRN
  Administered 2014-08-30: 75 mL via INTRAVENOUS

## 2014-08-30 NOTE — Telephone Encounter (Signed)
Pt is very very very anxious to get results of scan and would like dr Tamala Julian to call her immediately after getting the scan results  Best number [phone]

## 2014-08-30 NOTE — Telephone Encounter (Signed)
Report is not in yet. Pt just had this done at 5:30 today.

## 2014-08-30 NOTE — Telephone Encounter (Signed)
Patient is calling because she went to the hospital and has a CT scan scheduled for today at 3:30 and the results will be sent to Dr. Tamala Julian tomorrow. Patient will be coming for a walk in visit tomorrow to see Dr. Tamala Julian. Please call patient! [phone]

## 2014-08-31 ENCOUNTER — Encounter (HOSPITAL_COMMUNITY): Payer: Self-pay | Admitting: Emergency Medicine

## 2014-08-31 ENCOUNTER — Ambulatory Visit (INDEPENDENT_AMBULATORY_CARE_PROVIDER_SITE_OTHER): Payer: Medicare Other | Admitting: Family Medicine

## 2014-08-31 ENCOUNTER — Emergency Department (HOSPITAL_COMMUNITY)
Admission: EM | Admit: 2014-08-31 | Discharge: 2014-08-31 | Disposition: A | Discharge status: Home / Self Care | Payer: Medicare Other | Attending: Emergency Medicine | Admitting: Emergency Medicine

## 2014-08-31 ENCOUNTER — Other Ambulatory Visit (HOSPITAL_COMMUNITY): Payer: Medicare Other

## 2014-08-31 ENCOUNTER — Telehealth: Payer: Self-pay | Admitting: Family Medicine

## 2014-08-31 VITALS — BP 100/70 | HR 90 | Temp 97.5°F | Resp 18 | Ht 67.5 in | Wt 131.2 lb

## 2014-08-31 DIAGNOSIS — Z79899 Other long term (current) drug therapy: Secondary | ICD-10-CM | POA: Insufficient documentation

## 2014-08-31 DIAGNOSIS — K029 Dental caries, unspecified: Secondary | ICD-10-CM | POA: Diagnosis not present

## 2014-08-31 DIAGNOSIS — F319 Bipolar disorder, unspecified: Secondary | ICD-10-CM | POA: Diagnosis not present

## 2014-08-31 DIAGNOSIS — F419 Anxiety disorder, unspecified: Secondary | ICD-10-CM | POA: Diagnosis not present

## 2014-08-31 DIAGNOSIS — Z792 Long term (current) use of antibiotics: Secondary | ICD-10-CM | POA: Insufficient documentation

## 2014-08-31 DIAGNOSIS — J45909 Unspecified asthma, uncomplicated: Secondary | ICD-10-CM | POA: Insufficient documentation

## 2014-08-31 DIAGNOSIS — N809 Endometriosis, unspecified: Secondary | ICD-10-CM

## 2014-08-31 DIAGNOSIS — Z720 Tobacco use: Secondary | ICD-10-CM | POA: Insufficient documentation

## 2014-08-31 DIAGNOSIS — F458 Other somatoform disorders: Secondary | ICD-10-CM

## 2014-08-31 DIAGNOSIS — N8320 Unspecified ovarian cysts: Secondary | ICD-10-CM

## 2014-08-31 DIAGNOSIS — K088 Other specified disorders of teeth and supporting structures: Secondary | ICD-10-CM | POA: Diagnosis not present

## 2014-08-31 DIAGNOSIS — M419 Scoliosis, unspecified: Secondary | ICD-10-CM | POA: Insufficient documentation

## 2014-08-31 DIAGNOSIS — R519 Headache, unspecified: Secondary | ICD-10-CM

## 2014-08-31 DIAGNOSIS — Z8742 Personal history of other diseases of the female genital tract: Secondary | ICD-10-CM | POA: Insufficient documentation

## 2014-08-31 DIAGNOSIS — R51 Headache: Secondary | ICD-10-CM | POA: Diagnosis not present

## 2014-08-31 DIAGNOSIS — N83209 Unspecified ovarian cyst, unspecified side: Secondary | ICD-10-CM

## 2014-08-31 DIAGNOSIS — Z0000 Encounter for general adult medical examination without abnormal findings: Secondary | ICD-10-CM

## 2014-08-31 DIAGNOSIS — R220 Localized swelling, mass and lump, head: Secondary | ICD-10-CM | POA: Diagnosis not present

## 2014-08-31 DIAGNOSIS — M1990 Unspecified osteoarthritis, unspecified site: Secondary | ICD-10-CM | POA: Diagnosis not present

## 2014-08-31 DIAGNOSIS — Z791 Long term (current) use of non-steroidal anti-inflammatories (NSAID): Secondary | ICD-10-CM | POA: Insufficient documentation

## 2014-08-31 DIAGNOSIS — Z880 Allergy status to penicillin: Secondary | ICD-10-CM | POA: Insufficient documentation

## 2014-08-31 MED ORDER — HYDROCODONE-ACETAMINOPHEN 5-325 MG PO TABS: 1.0000 | ORAL_TABLET | Freq: Four times a day (QID) | ORAL | Status: DC | PRN

## 2014-08-31 MED ORDER — PREDNISONE 20 MG PO TABS
ORAL_TABLET | ORAL | Status: DC
Start: 2014-08-31 — End: 2015-01-22

## 2014-08-31 NOTE — Telephone Encounter (Signed)
Patient called on-call service.  Facial swelling worsening.  S/p CT neck today; awaiting results.  Facial swelling has worsened since ED visit and CT today.  No difficulties swallowing or breathing.  Some wheezing but history of asthma.  A/P: facial swelling with LAD: recommend ED evaluation if significantly worsening swelling; other option is to present to office in am if swelling is mildly worse.  Pt expressed understanding. CT results not in system.   Teagan Heidrick Elayne Guerin, M.D. [facility] [address] [place], [place]  [address] [phone] phone [phone] fax

## 2014-08-31 NOTE — ED Notes (Signed)
Pt. reports persistent left upper dental pain for several days , currently taking oral antibiotic.

## 2014-08-31 NOTE — ED Provider Notes (Signed)
CSN: [id number]     Arrival date & time 08/31/14  0228 History  This chart was scribed for Catherine Balls, MD by Rayfield Citizen, ED Scribe. This patient was seen in room A01C/A01C and the patient's care was started at 3:38 AM.    Chief Complaint  Patient presents with  . Dental Pain   The history is provided by the patient. No language interpreter was used.     HPI Comments: Catherine Olsen is a 44 y.o. female who presents to the Emergency Department complaining of 3 days of gradually left upper dental pain with left-sided facial swelling and tenderness. Patient states her PCP originally believed it was an infection to her parotid gland and she was started on 150mg  of Clindamycin 3x daily. When her symptoms persisted, patient felt they could be related to a dental issue; she was seen by her dentist who dissuaded her from this idea. Her PCP doubled her dose of Clindamycin but patient's symptoms have not improved. She was seen in the ED last night but due to a significant wait time, returned this afternoon for a CT. She denies fevers.   Past Medical History  Diagnosis Date  . Asthma   . Anxiety   . Mental disorder   . Bipolar 1 disorder   . Endometriosis   . PONV (postoperative nausea and vomiting)   . Termination of pregnancy     x 2 at age 89 and 44 yrs old  . Depression   . Headache(784.0)     otc med prn  . Arthritis     hands, lower back, knees  . Fibromyalgia   . Scoliosis   . Scoliosis   . Allergy    Past Surgical History  Procedure Laterality Date  . Nose surgery      rhinoplasty at age 85 yrs  . Wisdom tooth extraction    . Facial cosmetic surgery      right cheek  . Laparoscopy Right 11/11/2013    Procedure: LAPAROSCOPY OPERATIVE with  Drainage of  RIGHT Ovarian ENDOMETRIOMA;  Surgeon: Elveria Royals, MD;  Location: [facility];  Service: Gynecology;  Laterality: Right;  . Dilation and curettage of uterus    . Breast surgery     Family History  Problem Relation Age of Onset  .  Diabetes Mother   . Hypertension Mother   . Stroke Mother   . Mental illness Mother   . Heart disease Father   . Hyperlipidemia Father   . Hypertension Father   . Diabetes Maternal Grandmother   . Heart disease Maternal Grandmother   . Hyperlipidemia Maternal Grandmother   . Hypertension Maternal Grandmother   . Mental illness Maternal Grandmother   . Heart disease Maternal Grandfather   . Hyperlipidemia Maternal Grandfather   . Hypertension Maternal Grandfather   . Heart disease Paternal Grandfather   . Hyperlipidemia Paternal Grandfather   . Hypertension Paternal Grandfather   . Stroke Paternal Grandfather    History  Substance Use Topics  . Smoking status: Current Every Day Smoker -- 29 years    Types: Cigarettes, E-cigarettes  . Smokeless tobacco: Never Used  . Alcohol Use: No     Comment: "rare"   OB History    Gravida Para Term Preterm AB TAB SAB Ectopic Multiple Living   2    2 2     0     Review of Systems  Constitutional: Negative for fever.  HENT: Positive for dental problem and facial swelling.  All other systems reviewed and are negative.     Allergies  Sulfamethoxazole; Oxycodone; Penicillins; Other; Percocet; Sulfa antibiotics; and Sulfites  Home Medications   Prior to Admission medications   Medication Sig Start Date End Date Taking? Authorizing Provider  ARIPiprazole (ABILIFY) 5 MG tablet Take 5 mg by mouth daily. 07/04/14   Historical Provider, MD  chlorhexidine (PERIDEX) 0.12 % solution Use as directed 15 mLs in the mouth or throat 2 (two) times daily. 08/24/14   Wardell Honour, MD  clindamycin (CLEOCIN) 150 MG capsule Take 2 capsules (300 mg total) by mouth 3 (three) times daily. 08/24/14   Wardell Honour, MD  clonazePAM (KLONOPIN) 2 MG tablet Take 4 mg by mouth at bedtime as needed. For sleep 06/28/14   Historical Provider, MD  diphenhydrAMINE (BENADRYL) 25 MG tablet Take 50-100 mg by mouth every 8 (eight) hours as needed for itching, allergies or  sleep.     Historical Provider, MD  EPINEPHrine (EPIPEN 2-PAK) 0.3 mg/0.3 mL IJ SOAJ injection Inject 0.3 mLs (0.3 mg total) into the muscle once. 05/14/14   Elnora Morrison, MD  HYDROcodone-acetaminophen (NORCO) 5-325 MG per tablet Take 1 tablet by mouth every 8 (eight) hours as needed for moderate pain. 08/17/14   Wardell Honour, MD  ibuprofen (ADVIL,MOTRIN) 800 MG tablet Take 1 tablet (800 mg total) by mouth 3 (three) times daily. 08/24/14   Wardell Honour, MD  Magnesium 250 MG TABS Take 250 mg by mouth daily.     Historical Provider, MD  Multiple Vitamin (MULTIVITAMIN WITH MINERALS) TABS tablet Take 1 tablet by mouth daily.    Historical Provider, MD  nicotine (NICODERM CQ - DOSED IN MG/24 HOURS) 21 mg/24hr patch  06/18/14   Historical Provider, MD   BP 113/76 mmHg  Pulse 78  Temp(Src) 97.9 F (36.6 C) (Oral)  Resp 20  Wt 130 lb (58.968 kg)  SpO2 97%  LMP 08/17/2014 Physical Exam  Constitutional: She is oriented to person, place, and time. She appears well-developed and well-nourished. No distress.  HENT:  Head: Normocephalic and atraumatic.  Mouth/Throat: Oropharynx is clear and moist. No oropharyngeal exudate.  Moist mucous membranes. Palpable mobile lymph node, 1cm, in the left parotid area with mild swelling. No TTP, no overlying erythema.   Eyes: EOM are normal. Pupils are equal, round, and reactive to light.  Neck: Normal range of motion. Neck supple. No JVD present.  Cardiovascular: Normal rate, regular rhythm and normal heart sounds.  Exam reveals no gallop and no friction rub.   No murmur heard. Pulmonary/Chest: Effort normal and breath sounds normal. No respiratory distress. She has no wheezes. She has no rales.  Abdominal: Soft. Bowel sounds are normal. She exhibits no mass. There is no tenderness. There is no rebound and no guarding.  Musculoskeletal: Normal range of motion. She exhibits no edema.  Moves all extremities normally.   Lymphadenopathy:    She has no cervical  adenopathy.  Neurological: She is alert and oriented to person, place, and time. She displays normal reflexes.  Skin: Skin is warm and dry. No rash noted.  Psychiatric: She has a normal mood and affect. Her behavior is normal.  Nursing note and vitals reviewed.   ED Course  Procedures   DIAGNOSTIC STUDIES: Oxygen Saturation is 97% on RA, adequate by my interpretation.    COORDINATION OF CARE: 3:43 AM Discussed treatment plan with pt at bedside and pt agreed to plan.   Labs Review Labs Reviewed - No data to  display  Imaging Review Ct Soft Tissue Neck W Contrast  08/31/2014   CLINICAL DATA:  Right parotid gland swelling. Being treated for tooth abscess.  EXAM: CT NECK WITH CONTRAST  TECHNIQUE: Multidetector CT imaging of the neck was performed using the standard protocol following the bolus administration of intravenous contrast.  CONTRAST:  64mL OMNIPAQUE IOHEXOL 300 MG/ML  SOLN  COMPARISON:  None.  FINDINGS: Pharynx and larynx: Patent with no abnormal enhancement or asymmetry.  Salivary glands: Unremarkable.  No inflammatory changes are stone.  Thyroid: Unremarkable  Lymph nodes: No lymphadenopathy.  Vascular: Major vessels are widely patent.  Limited intracranial: Unremarkable  Visualized orbits: Negative  Mastoids and visualized paranasal sinuses: Clear  Skeleton: Symmetric densities anterior to the zygomas is likely from cosmetic implants.  Upper chest: Few emphysematous spaces.  No acute findings.  IMPRESSION: No acute finding or explanation for right neck symptoms.   Electronically Signed   By: Monte Fantasia M.D.   On: 08/31/2014 04:00     EKG Interpretation None      MDM   Final diagnoses:  None   Patient presents to emergency department for left facial swelling. She states this has been going on for weeks. She seen her primary care physician and been to the emergency department multiple times. She left early yesterday prior to getting her CT scan results of her face. I  spoke with radiology who do not see any acute findings on CT scan. See currently has 2 doses left for clindamycin given to her prior primary care physician. She was told to finish this course. Her exam is unremarkable and do not believe patient has any signs of infection, cancer.  Patient is advised to see her primary care physician within 3 days and also consider ENT follow-up. She otherwise appear well in no acute distress. Vital signs were within her normal limits and she is safe for discharge.  I personally performed the services described in this documentation, which was scribed in my presence. The recorded information has been reviewed and is accurate.     Catherine Balls, MD 08/31/14 [phone]

## 2014-08-31 NOTE — Progress Notes (Signed)
Subjective:    Patient ID: Catherine Olsen, female    DOB: 02/21/1971, 44 y.o.   MRN: [id number]  08/31/2014  Follow-up   HPI This 44 y.o. female presents for evaluation of L facial swelling.  S/p two ED visits since last visit.  Taking Ibuprofen 800mg  tid, Hydrocodone tid is not controlling pain.  Thinks secondary to grinding teeth and teeth issues.  Chlorahexadine has really helped with diffuse gum swelling.  Has an exam on 09/11/14.   Needs to schedule appointment with [facility] dental school.  L gum is still swollen and seems more swollen.  Requesting Prednisone for inflammation.  Unable to bite down or chew on L side.     Review of Systems  Constitutional: Negative for fever, chills, diaphoresis and fatigue.  HENT: Positive for dental problem, ear pain and facial swelling. Negative for congestion, ear discharge, hearing loss, mouth sores, postnasal drip, rhinorrhea, sinus pressure, sneezing, sore throat, tinnitus, trouble swallowing and voice change.   Respiratory: Negative for cough and shortness of breath.   Skin: Negative for rash.  Neurological: Positive for headaches.  Psychiatric/Behavioral: The patient is nervous/anxious.     Past Medical History  Diagnosis Date  . Asthma   . Anxiety   . Mental disorder   . Bipolar 1 disorder   . Endometriosis   . PONV (postoperative nausea and vomiting)   . Termination of pregnancy     x 2 at age 65 and 44 yrs old  . Depression   . Headache(784.0)     otc med prn  . Arthritis     hands, lower back, knees  . Fibromyalgia   . Scoliosis   . Scoliosis   . Allergy    Past Surgical History  Procedure Laterality Date  . Nose surgery      rhinoplasty at age 35 yrs  . Wisdom tooth extraction    . Facial cosmetic surgery      right cheek  . Laparoscopy Right 11/11/2013    Procedure: LAPAROSCOPY OPERATIVE with  Drainage of  RIGHT Ovarian ENDOMETRIOMA;  Surgeon: Elveria Royals, MD;  Location: [facility];  Service: Gynecology;  Laterality: Right;  .  Dilation and curettage of uterus    . Breast surgery     Allergies  Allergen Reactions  . Sulfamethoxazole Anaphylaxis  . Oxycodone Other (See Comments)    Severe blindness?  . Penicillins Hives  . Other Anxiety    steroids  . Percocet [Oxycodone-Acetaminophen] Palpitations  . Sulfa Antibiotics Hives  . Sulfites Other (See Comments)    unknown   History   Social History  . Marital Status: Divorced    Spouse Name: N/A  . Number of Children: N/A  . Years of Education: N/A   Occupational History  . Not on file.   Social History Main Topics  . Smoking status: Current Every Day Smoker -- 29 years    Types: Cigarettes, E-cigarettes  . Smokeless tobacco: Never Used  . Alcohol Use: No     Comment: "rare"  . 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 Sexual Activity: No     Comment: abortion at 9 and 76yrs. of age    Other Topics Concern  . Not on file   Social History Narrative        Objective:    BP 100/70 mmHg  Pulse 90  Temp(Src) 97.5 F (36.4 C) (Oral)  Resp 18  Ht 5' 7.5" (1.715 m)  Wt 131 lb 4 oz (59.535 kg)  BMI 20.24 kg/m2  SpO2 98%  LMP 08/17/2014 Physical Exam  Constitutional: She is oriented to person, place, and time. She appears well-developed and well-nourished. No distress.  HENT:  Head: Normocephalic and atraumatic.  Right Ear: Tympanic membrane and ear canal normal.  Left Ear: Tympanic membrane and ear canal normal.  Nose: Nose normal. No mucosal edema or rhinorrhea. Right sinus exhibits no maxillary sinus tenderness and no frontal sinus tenderness. Left sinus exhibits no maxillary sinus tenderness and no frontal sinus tenderness.  Mouth/Throat: Oropharynx is clear and moist and mucous membranes are normal. No oral lesions. Abnormal dentition. Dental caries present. No dental abscesses, uvula swelling or lacerations. No oropharyngeal exudate, posterior oropharyngeal edema, posterior  oropharyngeal erythema or tonsillar abscesses.  Minimal L facial swelling at L maxillary region.  +TTP TMJ region on L.  No parotid TTP; no parotid swelling.  Eyes: Conjunctivae are normal. Pupils are equal, round, and reactive to light.  Neck: Normal range of motion. Neck supple.  Cardiovascular: Normal rate, regular rhythm and normal heart sounds.  Exam reveals no gallop and no friction rub.   No murmur heard. Pulmonary/Chest: Effort normal and breath sounds normal. She has no wheezes. She has no rales.  Lymphadenopathy:       Head (right side): No submental, no submandibular, no tonsillar, no preauricular, no posterior auricular and no occipital adenopathy present.       Head (left side): No submental, no submandibular, no tonsillar, no preauricular, no posterior auricular and no occipital adenopathy present.    She has no cervical adenopathy.  Neurological: She is alert and oriented to person, place, and time.  Skin: She is not diaphoretic.  Psychiatric: She has a normal mood and affect. Her behavior is normal.  Nursing note and vitals reviewed.  Results for orders placed or performed during the hospital encounter of 07/09/14  CBC with Differential  Result Value Ref Range   WBC 8.0 4.0 - 10.5 K/uL   RBC 4.79 3.87 - 5.11 MIL/uL   Hemoglobin 15.3 (H) 12.0 - 15.0 g/dL   HCT 43.2 36.0 - 46.0 %   MCV 90.2 78.0 - 100.0 fL   MCH 31.9 26.0 - 34.0 pg   MCHC 35.4 30.0 - 36.0 g/dL   RDW 13.5 11.5 - 15.5 %   Platelets 261 150 - 400 K/uL   Neutrophils Relative % 73 43 - 77 %   Neutro Abs 5.8 1.7 - 7.7 K/uL   Lymphocytes Relative 21 12 - 46 %   Lymphs Abs 1.7 0.7 - 4.0 K/uL   Monocytes Relative 4 3 - 12 %   Monocytes Absolute 0.3 0.1 - 1.0 K/uL   Eosinophils Relative 2 0 - 5 %   Eosinophils Absolute 0.2 0.0 - 0.7 K/uL   Basophils Relative 0 0 - 1 %   Basophils Absolute 0.0 0.0 - 0.1 K/uL  Comprehensive metabolic panel  Result Value Ref Range   Sodium 134 (L) 135 - 145 mmol/L    Potassium 4.1 3.5 - 5.1 mmol/L   Chloride 102 96 - 112 mmol/L   CO2 19 19 - 32 mmol/L   Glucose, Bld 89 70 - 99 mg/dL   BUN 18 6 - 23 mg/dL   Creatinine, Ser 0.77 0.50 - 1.10 mg/dL   Calcium 8.6 8.4 - 10.5 mg/dL   Total Protein 6.9 6.0 - 8.3 g/dL   Albumin 4.1 3.5 -  5.2 g/dL   AST 20 0 - 37 U/L   ALT 14 0 - 35 U/L   Alkaline Phosphatase 62 39 - 117 U/L   Total Bilirubin 0.7 0.3 - 1.2 mg/dL   GFR calc non Af Amer >90 >90 mL/min   GFR calc Af Amer >90 >90 mL/min   Anion gap 13 5 - 15       Assessment & Plan:   1. Left facial pain   2. Dental caries   3. Teeth grinding   4. Cyst of ovary, unspecified laterality   5. Endometriosis    1. L facial pain: Persistent; secondary to dental issues and ongoing teeth grinding/TMJ pathology.  Advised to complete two week course of Clindamycin to treat any dental infection.  Agreeable to Prednisone to treat any TMJ inflammation. Refer to ENT.  S/p CT neck that was negative. Refill of hydrocodone provided; expressed concern regarding amount of hydrocodone that patient is needing; will start weaning hydrocodone. 2.  Dental caries: stable; s/p two weeks of Clindamycin therapy; recommend follow-up with dentist. 3.  Teeth grinding/L TMJ: Persistent; refer to ENT; warrants mouth guard; rx for Prednisone.     Meds ordered this encounter  Medications  . predniSONE (DELTASONE) 20 MG tablet    Sig: Two tablets daily x 5 days then one tablet daily x 5 days    Dispense:  15 tablet    Refill:  0  . HYDROcodone-acetaminophen (NORCO) 5-325 MG per tablet    Sig: Take 1-1.5 tablets by mouth every 6 (six) hours as needed for moderate pain.    Dispense:  60 tablet    Refill:  0    No Follow-up on file.     Hibah Odonnell Elayne Guerin, M.D. [facility] [address] [place], [place]  [address] [phone] phone [phone] fax

## 2014-08-31 NOTE — Discharge Instructions (Signed)
Lymph Nodes Ms. Wolaver, your CT scan results are below.  Finish your antibiotics. See your primary care physician or ENT within 3 days for close follow-up. If symptoms worsen come back to the emergency department immediately. Thank you.   FINDINGS: Pharynx and larynx: Patent with no abnormal enhancement or asymmetry. Salivary glands: Unremarkable. No inflammatory changes are stone. Thyroid: Unremarkable Lymph nodes: No lymphadenopathy. Vascular: Major vessels are widely patent. Limited intracranial: Unremarkable Visualized orbits: Negative Mastoids and visualized paranasal sinuses: Clear Skeleton: Symmetric densities anterior to the zygomas is likely from cosmetic implants. Upper chest: Few emphysematous spaces. No acute findings. IMPRESSION: No acute finding or explanation for right neck symptoms.  The lymphatic system filters fluid from around cells. It is like a system of blood vessels. These channels carry lymph instead of blood. The lymphatic system is an important part of the immune (disease fighting) system. When people talk about "swollen glands in the neck," they are usually talking about swollen lymph nodes. The lymph nodes are like the little traps for infection. You and your caregiver may be able to feel lymph nodes, especially swollen nodes, in these common areas: the groin (inguinal area), armpits (axilla), and above the clavicle (supraclavicular). You may also feel them in the neck (cervical) and the back of the head just above the hairline (occipital). Swollen glands occur when there is any condition in which the body responds with an allergic type of reaction. For instance, the glands in the neck can become swollen from insect bites or any type of minor infection on the head. These are very noticeable in children with only minor problems. Lymph nodes may also become swollen when there is a tumor or problem with the lymphatic system, such as Hodgkin's disease. TREATMENT   Most  swollen glands do not require treatment. They can be observed (watched) for a short period of time, if your caregiver feels it is necessary. Most of the time, observation is not necessary.  Antibiotics (medicines that kill germs) may be prescribed by your caregiver. Your caregiver may prescribe these if he or she feels the swollen glands are due to a bacterial (germ) infection. Antibiotics are not used if the swollen glands are caused by a virus. HOME CARE INSTRUCTIONS   Take medications as directed by your caregiver. Only take over-the-counter or prescription medicines for pain, discomfort, or fever as directed by your caregiver. SEEK MEDICAL CARE IF:   If you begin to run a temperature greater than 102 F (38.9 C), or as your caregiver suggests. MAKE SURE YOU:   Understand these instructions.  Will watch your condition.  Will get help right away if you are not doing well or get worse. Document Released: 03/14/2002 Document Revised: 06/16/2011 Document Reviewed: 03/24/2005 [facility] Patient Information 2015 [place], [place]. This information is not intended to replace advice given to you by your health care provider. Make sure you discuss any questions you have with your health care provider.

## 2014-09-02 ENCOUNTER — Encounter: Payer: Self-pay | Admitting: Family Medicine

## 2014-09-14 ENCOUNTER — Encounter: Payer: Self-pay | Admitting: Family Medicine

## 2014-09-18 ENCOUNTER — Encounter: Payer: Self-pay | Admitting: Family Medicine

## 2014-09-20 ENCOUNTER — Ambulatory Visit (INDEPENDENT_AMBULATORY_CARE_PROVIDER_SITE_OTHER): Payer: Medicare Other | Admitting: Family Medicine

## 2014-09-20 ENCOUNTER — Telehealth: Payer: Self-pay

## 2014-09-20 VITALS — BP 102/70 | HR 72 | Temp 97.6°F | Resp 18 | Ht 68.5 in | Wt 134.0 lb

## 2014-09-20 DIAGNOSIS — L509 Urticaria, unspecified: Secondary | ICD-10-CM | POA: Diagnosis not present

## 2014-09-20 DIAGNOSIS — N8320 Unspecified ovarian cysts: Secondary | ICD-10-CM

## 2014-09-20 DIAGNOSIS — F310 Bipolar disorder, current episode hypomanic: Secondary | ICD-10-CM

## 2014-09-20 DIAGNOSIS — M5416 Radiculopathy, lumbar region: Secondary | ICD-10-CM | POA: Diagnosis not present

## 2014-09-20 DIAGNOSIS — IMO0001 Reserved for inherently not codable concepts without codable children: Secondary | ICD-10-CM

## 2014-09-20 DIAGNOSIS — N809 Endometriosis, unspecified: Secondary | ICD-10-CM

## 2014-09-20 DIAGNOSIS — N83209 Unspecified ovarian cyst, unspecified side: Secondary | ICD-10-CM

## 2014-09-20 DIAGNOSIS — J4520 Mild intermittent asthma, uncomplicated: Secondary | ICD-10-CM | POA: Diagnosis not present

## 2014-09-20 DIAGNOSIS — N83201 Unspecified ovarian cyst, right side: Secondary | ICD-10-CM

## 2014-09-20 MED ORDER — EPINEPHRINE 0.3 MG/0.3ML IJ SOAJ: 0.3000 mg | Freq: Once | INTRAMUSCULAR | Status: DC

## 2014-09-20 MED ORDER — ALBUTEROL SULFATE HFA 108 (90 BASE) MCG/ACT IN AERS: 2.0000 | INHALATION_SPRAY | Freq: Four times a day (QID) | RESPIRATORY_TRACT | Status: DC | PRN

## 2014-09-20 MED ORDER — CELECOXIB 200 MG PO CAPS: 200.0000 mg | ORAL_CAPSULE | Freq: Two times a day (BID) | ORAL | Status: DC

## 2014-09-20 MED ORDER — HYDROCODONE-ACETAMINOPHEN 5-325 MG PO TABS: 0.5000 | ORAL_TABLET | Freq: Four times a day (QID) | ORAL | Status: DC | PRN

## 2014-09-20 NOTE — Telephone Encounter (Signed)
Patient is calling because she sent Dr. Tamala Julian an e-mail to get her hydrocodone refilled. Patient phone: [phone]

## 2014-09-20 NOTE — Progress Notes (Signed)
Subjective:    Patient ID: Catherine Olsen, female    DOB: 1970-07-08, 44 y.o.   MRN: [id number]  09/20/2014  Medication Refill; Back Pain; and Depression   HPI This 44 y.o. female presents for evaluation of worsening pelvic pain.  Feels that endometriosis is causing recurrent pain; had similar pain for one year; worked up for disc problem.  Radiates down; causes leg numbness.  Tried Celebrex for pain in past; then only occurred prior to menses.  Late for menses; should have started one week ago.  Diagnosed with perimenopause by gynecologist.  Family history of blood clot in grandfather.  Smoker so cannot take OCP.  Was down to 3 cigarettes per day but emotionally declined with anger.  Does not feel that ovarian cyst is cause of pain; this pain is more consistent with endometriosis pain.  When had surgery in 11/2013, pain was relieved.  Back pain relieved in 11/2013 after pelvic surgery for ovarian cyst.  No overuse or injury to back.  Definite same exact pain as with adherence of ovarian cyst to other organs.  When ovarian cyst enlarges, develops abdominal clenching.  Previous ED provider concerned about appendicitis; this occurred when first diagnosed with ovarian cyst R.   Realizes that taking too much narcotic for pain.  Hopes that pain will get better with onset of menses.  Unable to exercise due to pain.  1/2 hydrocodone every six hours.  Has been taking 1.5 hydrocodone for facial pain but now much improved.  Prescribed hydrocodone 08/31/14 #60.  Scheduled repeat pelvis US for 10/24/14.  Really wants to wait to see if menses will come soon.  Has also been taking a lot of dairy products in past month which seems to make ovarian cyst grow?  No sexual activity.  No fever/chills/sweats.  No n/v/d/c.  No dysuria, urgency, frequency, hematuria, nocturia. No vaginal discharge, irritation, or pain.    No sexual activity in 07/2013.     L facial swelling and pain: steroid really helped with pain; has been grinding  teeth more in past 3 days due to abdominal pain.  Taking Klonopin twice per month at this point; really hates to take.    Hives: chronic recurrent issue for patient; requesting refill of Epipen.  Etiology of hives is unclear.  Has undergone extensive testing in the past.  No current hives.  Asthma: stable; requesting Albuterol refill; uses Albuterol twice per year on average; has increased smoking in the past week which has increased wheezing.  Denies SOB or respiratory distress.  No acute illness or allergy symptoms that have acutely worsened.  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  Not interested in labs today; would prefer to obtain at CPE in 11/2014.    Review of Systems  Constitutional: Negative for fever, chills, diaphoresis and fatigue.  HENT: Negative for congestion, ear pain, facial swelling, postnasal drip, rhinorrhea and sore throat.   Respiratory: Positive for wheezing. Negative for cough and shortness of breath.   Gastrointestinal: Positive for nausea. Negative for vomiting, abdominal pain, diarrhea and constipation.  Genitourinary: Positive for menstrual problem and pelvic pain. Negative for dysuria, urgency, frequency, hematuria, flank pain, vaginal bleeding, vaginal discharge, genital sores and vaginal pain.  Musculoskeletal: Positive for myalgias, back pain and gait problem.  Skin: Negative for rash.  Neurological: Positive for numbness. Negative for weakness.  Psychiatric/Behavioral: The patient is nervous/anxious.     Past Medical History  Diagnosis Date  .  Asthma   . Anxiety   . Mental disorder   . Bipolar 1 disorder   . Endometriosis   . PONV (postoperative nausea and vomiting)   . Termination of pregnancy     x 2 at age 76 and 44 yrs old  . Depression   . Headache(784.0)     otc med prn  . Arthritis     hands, lower back, knees  . Fibromyalgia   . Scoliosis   . Scoliosis   . Allergy    Past  Surgical History  Procedure Laterality Date  . Nose surgery      rhinoplasty at age 60 yrs  . Wisdom tooth extraction    . Facial cosmetic surgery      right cheek  . Laparoscopy Right 11/11/2013    Procedure: LAPAROSCOPY OPERATIVE with  Drainage of  RIGHT Ovarian ENDOMETRIOMA;  Surgeon: Elveria Royals, MD;  Location: [facility];  Service: Gynecology;  Laterality: Right;  . Dilation and curettage of uterus    . Breast surgery     Allergies  Allergen Reactions  . Sulfamethoxazole Anaphylaxis  . Oxycodone Other (See Comments)    Severe blindness?  . Penicillins Hives  . Other Anxiety    steroids  . Percocet [Oxycodone-Acetaminophen] Palpitations  . Sulfa Antibiotics Hives  . Sulfites Other (See Comments)    unknown   Current Outpatient Prescriptions  Medication Sig Dispense Refill  . clonazePAM (KLONOPIN) 2 MG tablet Take 4 mg by mouth at bedtime as needed. For sleep  1  . diphenhydrAMINE (BENADRYL) 25 MG tablet Take 50-100 mg by mouth every 8 (eight) hours as needed for itching, allergies or sleep.     Marland Kitchen EPINEPHrine (EPIPEN 2-PAK) 0.3 mg/0.3 mL IJ SOAJ injection Inject 0.3 mLs (0.3 mg total) into the muscle once. 1 Device 1  . ibuprofen (ADVIL,MOTRIN) 800 MG tablet Take 1 tablet (800 mg total) by mouth 3 (three) times daily. 40 tablet 0  . Multiple Vitamin (MULTIVITAMIN WITH MINERALS) TABS tablet Take 1 tablet by mouth daily.    Marland Kitchen albuterol (PROVENTIL HFA;VENTOLIN HFA) 108 (90 BASE) MCG/ACT inhaler Inhale 2 puffs into the lungs every 6 (six) hours as needed for wheezing or shortness of breath (cough, shortness of breath or wheezing.). 1 Inhaler 1  . ARIPiprazole (ABILIFY) 5 MG tablet Take 5 mg by mouth daily.    . celecoxib (CELEBREX) 200 MG capsule Take 1 capsule (200 mg total) by mouth 2 (two) times daily. 60 capsule 0  . chlorhexidine (PERIDEX) 0.12 % solution Use as directed 15 mLs in the mouth or throat 2 (two) times daily. (Patient not taking: Reported on 09/20/2014) 120 mL 3  .  clindamycin (CLEOCIN) 150 MG capsule Take 2 capsules (300 mg total) by mouth 3 (three) times daily. (Patient not taking: Reported on 09/20/2014) 42 capsule 0  . HYDROcodone-acetaminophen (NORCO) 5-325 MG per tablet Take 0.5 tablets by mouth every 6 (six) hours as needed for moderate pain. 60 tablet 0  . Magnesium 250 MG TABS Take 250 mg by mouth daily.     . nicotine (NICODERM CQ - DOSED IN MG/24 HOURS) 21 mg/24hr patch   1  . predniSONE (DELTASONE) 20 MG tablet Two tablets daily x 5 days then one tablet daily x 5 days (Patient not taking: Reported on 09/20/2014) 15 tablet 0   No current facility-administered medications for this visit.   History   Social History  . Marital Status: Divorced    Spouse Name: N/A  .  Number of Children: N/A  . Years of Education: N/A   Occupational History  . Not on file.   Social History Main Topics  . Smoking status: Current Every Day Smoker -- 29 years    Types: Cigarettes, E-cigarettes  . Smokeless tobacco: Never Used  . Alcohol Use: No     Comment: "rare"  . 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 Sexual Activity: No     Comment: abortion at 66 and 14yrs. of age    Other Topics Concern  . Not on file   Social History Narrative       Objective:    BP 102/70 mmHg  Pulse 72  Temp(Src) 97.6 F (36.4 C) (Oral)  Resp 18  Ht 5' 8.5" (1.74 m)  Wt 134 lb (60.782 kg)  BMI 20.08 kg/m2  SpO2 98%  LMP 08/17/2014 Physical Exam  Constitutional: She is oriented to person, place, and time. She appears well-developed and well-nourished. No distress.  HENT:  Head: Normocephalic and atraumatic.  Right Ear: External ear normal.  Left Ear: External ear normal.  Nose: Nose normal.  Mouth/Throat: Oropharynx is clear and moist.  Resolution of mild L facial swelling.  Eyes: Conjunctivae are normal. Pupils are equal, round, and reactive to light.  Neck: Normal range of motion. Neck supple. No  thyromegaly present.  Cardiovascular: Normal rate, regular rhythm and normal heart sounds.  Exam reveals no gallop and no friction rub.   No murmur heard. Pulmonary/Chest: Effort normal and breath sounds normal. She has no wheezes. She has no rales.  Abdominal: Soft. Bowel sounds are normal. She exhibits no distension and no mass. There is no tenderness. There is no rebound and no guarding.  Musculoskeletal:       Lumbar back: She exhibits decreased range of motion and pain. She exhibits no tenderness, no bony tenderness and no spasm.  Lumbar spine:  Non-tender midline; non-tender paraspinal regions B.  Straight leg raises negative B; toe and heel walking intact; marching intact; motor 5/5 BLE.  Full ROM lumbar spine with limitation due to pain in R lower lumbar region.   Lymphadenopathy:    She has no cervical adenopathy.  Neurological: She is alert and oriented to person, place, and time. She has normal strength. No cranial nerve deficit or sensory deficit.  Reflex Scores:      Patellar reflexes are 2+ on the right side and 2+ on the left side.      Achilles reflexes are 2+ on the right side and 2+ on the left side. Skin: She is not diaphoretic.  Psychiatric: She has a normal mood and affect. Her behavior is normal.  Nursing note and vitals reviewed.       Assessment & Plan:   1. Right ovarian cyst   2. Endometriosis   3. Radicular pain of right lower back   4. Cyst of ovary, unspecified laterality   5. Asthma, chronic, mild intermittent, uncomplicated   6. Hives    1. R ovarian cyst: stable; scheduled for repeat pelvic US in 10/2014.  Continue Hydrocodone sparingly for moderate pain. Follow-up with gynecology for severe pain. Pt has declined surgical resection due to decreased size of cyst at last [place]. 2.  Endometriosis: chronic; pt feels that current lower back pain due to endometriosis and scar tissue related to ovarian cyst on R; rx for Celebrex provided; refill of hydrocodone  provided as well  to use 1/2 qid.  Pt hoping that pain with improve with onset of menses. 3.  Lower back pain R with radicular symptoms: New. Rx for Celebrex provided; home exercise program provided to perform daily; continue Hydrocodone sparingly.  Pt declined lumbar spine films today due to extensive ortho work up in past with similar symptoms. 4.  Asthma chronic mild intermittent: worsening due to increased tobacco use; refill of Albuterol provided. 5.  Hives: stable; asymptomatic currently; refill of Epipen provided. 6. Bipolar disorder: managed by psychiatry.   Meds ordered this encounter  Medications  . DISCONTD: albuterol (PROVENTIL HFA;VENTOLIN HFA) 108 (90 BASE) MCG/ACT inhaler    Sig: Inhale 2 puffs into the lungs every 6 (six) hours as needed for wheezing or shortness of breath.  Marland Kitchen HYDROcodone-acetaminophen (NORCO) 5-325 MG per tablet    Sig: Take 0.5 tablets by mouth every 6 (six) hours as needed for moderate pain.    Dispense:  60 tablet    Refill:  0  . celecoxib (CELEBREX) 200 MG capsule    Sig: Take 1 capsule (200 mg total) by mouth 2 (two) times daily.    Dispense:  60 capsule    Refill:  0  . DISCONTD: albuterol (PROVENTIL HFA;VENTOLIN HFA) 108 (90 BASE) MCG/ACT inhaler    Sig: Inhale 2 puffs into the lungs every 6 (six) hours as needed for wheezing or shortness of breath (cough, shortness of breath or wheezing.).    Dispense:  1 Inhaler    Refill:  1  . DISCONTD: EPINEPHrine (EPIPEN 2-PAK) 0.3 mg/0.3 mL IJ SOAJ injection    Sig: Inject 0.3 mLs (0.3 mg total) into the muscle once.    Dispense:  1 Device    Refill:  1  . EPINEPHrine (EPIPEN 2-PAK) 0.3 mg/0.3 mL IJ SOAJ injection    Sig: Inject 0.3 mLs (0.3 mg total) into the muscle once.    Dispense:  1 Device    Refill:  1  . albuterol (PROVENTIL HFA;VENTOLIN HFA) 108 (90 BASE) MCG/ACT inhaler    Sig: Inhale 2 puffs into the lungs every 6 (six) hours as needed for wheezing or shortness of breath (cough, shortness of  breath or wheezing.).    Dispense:  1 Inhaler    Refill:  1    Patient prefers Proair.    No Follow-up on file.     Ruie Sendejo Elayne Guerin, M.D. [facility] [address] [place], [place]  [phone] phone [phone] fax

## 2014-09-20 NOTE — Patient Instructions (Signed)
Sciatica with Rehab The sciatic nerve runs from the back down the leg and is responsible for sensation and control of the muscles in the back (posterior) side of the thigh, lower leg, and foot. Sciatica is a condition that is characterized by inflammation of this nerve.  SYMPTOMS   Signs of nerve damage, including numbness and/or weakness along the posterior side of the lower extremity.  Pain in the back of the thigh that may also travel down the leg.  Pain that worsens when sitting for long periods of time.  Occasionally, pain in the back or buttock. CAUSES  Inflammation of the sciatic nerve is the cause of sciatica. The inflammation is due to something irritating the nerve. Common sources of irritation include:  Sitting for long periods of time.  Direct trauma to the nerve.  Arthritis of the spine.  Herniated or ruptured disk.  Slipping of the vertebrae (spondylolisthesis).  Pressure from soft tissues, such as muscles or ligament-like tissue (fascia). RISK INCREASES WITH:  Sports that place pressure or stress on the spine (football or weightlifting).  Poor strength and flexibility.  Failure to warm up properly before activity.  Family history of low back pain or disk disorders.  Previous back injury or surgery.  Poor body mechanics, especially when lifting, or poor posture. PREVENTION   Warm up and stretch properly before activity.  Maintain physical fitness:  Strength, flexibility, and endurance.  Cardiovascular fitness.  Learn and use proper technique, especially with posture and lifting. When possible, have coach correct improper technique.  Avoid activities that place stress on the spine. PROGNOSIS If treated properly, then sciatica usually resolves within 6 weeks. However, occasionally surgery is necessary.  RELATED COMPLICATIONS   Permanent nerve damage, including pain, numbness, tingle, or weakness.  Chronic back pain.  Risks of surgery: infection,  bleeding, nerve damage, or damage to surrounding tissues. TREATMENT Treatment initially involves resting from any activities that aggravate your symptoms. The use of ice and medication may help reduce pain and inflammation. The use of strengthening and stretching exercises may help reduce pain with activity. These exercises may be performed at home or with referral to a therapist. A therapist may recommend further treatments, such as transcutaneous electronic nerve stimulation (TENS) or ultrasound. Your caregiver may recommend corticosteroid injections to help reduce inflammation of the sciatic nerve. If symptoms persist despite non-surgical (conservative) treatment, then surgery may be recommended. MEDICATION  If pain medication is necessary, then nonsteroidal anti-inflammatory medications, such as aspirin and ibuprofen, or other minor pain relievers, such as acetaminophen, are often recommended.  Do not take pain medication for 7 days before surgery.  Prescription pain relievers may be given if deemed necessary by your caregiver. Use only as directed and only as much as you need.  Ointments applied to the skin may be helpful.  Corticosteroid injections may be given by your caregiver. These injections should be reserved for the most serious cases, because they may only be given a certain number of times. HEAT AND COLD  Cold treatment (icing) relieves pain and reduces inflammation. Cold treatment should be applied for 10 to 15 minutes every 2 to 3 hours for inflammation and pain and immediately after any activity that aggravates your symptoms. Use ice packs or massage the area with a piece of ice (ice massage).  Heat treatment may be used prior to performing the stretching and strengthening activities prescribed by your caregiver, physical therapist, or athletic trainer. Use a heat pack or soak the injury in warm water.   SEEK MEDICAL CARE IF:  Treatment seems to offer no benefit, or the condition  worsens.  Any medications produce adverse side effects. EXERCISES  RANGE OF MOTION (ROM) AND STRETCHING EXERCISES - Sciatica Most people with sciatic will find that their symptoms worsen with either excessive bending forward (flexion) or arching at the low back (extension). The exercises which will help resolve your symptoms will focus on the opposite motion. Your physician, physical therapist or athletic trainer will help you determine which exercises will be most helpful to resolve your low back pain. Do not complete any exercises without first consulting with your clinician. Discontinue any exercises which worsen your symptoms until you speak to your clinician. If you have pain, numbness or tingling which travels down into your buttocks, leg or foot, the goal of the therapy is for these symptoms to move closer to your back and eventually resolve. Occasionally, these leg symptoms will get better, but your low back pain may worsen; this is typically an indication of progress in your rehabilitation. Be certain to be very alert to any changes in your symptoms and the activities in which you participated in the 24 hours prior to the change. Sharing this information with your clinician will allow him/her to most efficiently treat your condition. These exercises may help you when beginning to rehabilitate your injury. Your symptoms may resolve with or without further involvement from your physician, physical therapist or athletic trainer. While completing these exercises, remember:   Restoring tissue flexibility helps normal motion to return to the joints. This allows healthier, less painful movement and activity.  An effective stretch should be held for at least 30 seconds.  A stretch should never be painful. You should only feel a gentle lengthening or release in the stretched tissue. FLEXION RANGE OF MOTION AND STRETCHING EXERCISES: STRETCH - Flexion, Single Knee to Chest   Lie on a firm bed or floor  with both legs extended in front of you.  Keeping one leg in contact with the floor, bring your opposite knee to your chest. Hold your leg in place by either grabbing behind your thigh or at your knee.  Pull until you feel a gentle stretch in your low back. Hold __________ seconds.  Slowly release your grasp and repeat the exercise with the opposite side. Repeat __________ times. Complete this exercise __________ times per day.  STRETCH - Flexion, Double Knee to Chest  Lie on a firm bed or floor with both legs extended in front of you.  Keeping one leg in contact with the floor, bring your opposite knee to your chest.  Tense your stomach muscles to support your back and then lift your other knee to your chest. Hold your legs in place by either grabbing behind your thighs or at your knees.  Pull both knees toward your chest until you feel a gentle stretch in your low back. Hold __________ seconds.  Tense your stomach muscles and slowly return one leg at a time to the floor. Repeat __________ times. Complete this exercise __________ times per day.  STRETCH - Low Trunk Rotation   Lie on a firm bed or floor. Keeping your legs in front of you, bend your knees so they are both pointed toward the ceiling and your feet are flat on the floor.  Extend your arms out to the side. This will stabilize your upper body by keeping your shoulders in contact with the floor.  Gently and slowly drop both knees together to one side until   you feel a gentle stretch in your low back. Hold for __________ seconds.  Tense your stomach muscles to support your low back as you bring your knees back to the starting position. Repeat the exercise to the other side. Repeat __________ times. Complete this exercise __________ times per day  EXTENSION RANGE OF MOTION AND FLEXIBILITY EXERCISES: STRETCH - Extension, Prone on Elbows  Lie on your stomach on the floor, a bed will be too soft. Place your palms about shoulder  width apart and at the height of your head.  Place your elbows under your shoulders. If this is too painful, stack pillows under your chest.  Allow your body to relax so that your hips drop lower and make contact more completely with the floor.  Hold this position for __________ seconds.  Slowly return to lying flat on the floor. Repeat __________ times. Complete this exercise __________ times per day.  RANGE OF MOTION - Extension, Prone Press Ups  Lie on your stomach on the floor, a bed will be too soft. Place your palms about shoulder width apart and at the height of your head.  Keeping your back as relaxed as possible, slowly straighten your elbows while keeping your hips on the floor. You may adjust the placement of your hands to maximize your comfort. As you gain motion, your hands will come more underneath your shoulders.  Hold this position __________ seconds.  Slowly return to lying flat on the floor. Repeat __________ times. Complete this exercise __________ times per day.  STRENGTHENING EXERCISES - Sciatica  These exercises may help you when beginning to rehabilitate your injury. These exercises should be done near your "sweet spot." This is the neutral, low-back arch, somewhere between fully rounded and fully arched, that is your least painful position. When performed in this safe range of motion, these exercises can be used for people who have either a flexion or extension based injury. These exercises may resolve your symptoms with or without further involvement from your physician, physical therapist or athletic trainer. While completing these exercises, remember:   Muscles can gain both the endurance and the strength needed for everyday activities through controlled exercises.  Complete these exercises as instructed by your physician, physical therapist or athletic trainer. Progress with the resistance and repetition exercises only as your caregiver advises.  You may  experience muscle soreness or fatigue, but the pain or discomfort you are trying to eliminate should never worsen during these exercises. If this pain does worsen, stop and make certain you are following the directions exactly. If the pain is still present after adjustments, discontinue the exercise until you can discuss the trouble with your clinician. STRENGTHENING - Deep Abdominals, Pelvic Tilt   Lie on a firm bed or floor. Keeping your legs in front of you, bend your knees so they are both pointed toward the ceiling and your feet are flat on the floor.  Tense your lower abdominal muscles to press your low back into the floor. This motion will rotate your pelvis so that your tail bone is scooping upwards rather than pointing at your feet or into the floor.  With a gentle tension and even breathing, hold this position for __________ seconds. Repeat __________ times. Complete this exercise __________ times per day.  STRENGTHENING - Abdominals, Crunches   Lie on a firm bed or floor. Keeping your legs in front of you, bend your knees so they are both pointed toward the ceiling and your feet are flat on the   floor. Cross your arms over your chest.  Slightly tip your chin down without bending your neck.  Tense your abdominals and slowly lift your trunk high enough to just clear your shoulder blades. Lifting higher can put excessive stress on the low back and does not further strengthen your abdominal muscles.  Control your return to the starting position. Repeat __________ times. Complete this exercise __________ times per day.  STRENGTHENING - Quadruped, Opposite UE/LE Lift  Assume a hands and knees position on a firm surface. Keep your hands under your shoulders and your knees under your hips. You may place padding under your knees for comfort.  Find your neutral spine and gently tense your abdominal muscles so that you can maintain this position. Your shoulders and hips should form a rectangle  that is parallel with the floor and is not twisted.  Keeping your trunk steady, lift your right hand no higher than your shoulder and then your left leg no higher than your hip. Make sure you are not holding your breath. Hold this position __________ seconds.  Continuing to keep your abdominal muscles tense and your back steady, slowly return to your starting position. Repeat with the opposite arm and leg. Repeat __________ times. Complete this exercise __________ times per day.  STRENGTHENING - Abdominals and Quadriceps, Straight Leg Raise   Lie on a firm bed or floor with both legs extended in front of you.  Keeping one leg in contact with the floor, bend the other knee so that your foot can rest flat on the floor.  Find your neutral spine, and tense your abdominal muscles to maintain your spinal position throughout the exercise.  Slowly lift your straight leg off the floor about 6 inches for a count of 15, making sure to not hold your breath.  Still keeping your neutral spine, slowly lower your leg all the way to the floor. Repeat this exercise with each leg __________ times. Complete this exercise __________ times per day. POSTURE AND BODY MECHANICS CONSIDERATIONS - Sciatica Keeping correct posture when sitting, standing or completing your activities will reduce the stress put on different body tissues, allowing injured tissues a chance to heal and limiting painful experiences. The following are general guidelines for improved posture. Your physician or physical therapist will provide you with any instructions specific to your needs. While reading these guidelines, remember:  The exercises prescribed by your provider will help you have the flexibility and strength to maintain correct postures.  The correct posture provides the optimal environment for your joints to work. All of your joints have less wear and tear when properly supported by a spine with good posture. This means you will  experience a healthier, less painful body.  Correct posture must be practiced with all of your activities, especially prolonged sitting and standing. Correct posture is as important when doing repetitive low-stress activities (typing) as it is when doing a single heavy-load activity (lifting). RESTING POSITIONS Consider which positions are most painful for you when choosing a resting position. If you have pain with flexion-based activities (sitting, bending, stooping, squatting), choose a position that allows you to rest in a less flexed posture. You would want to avoid curling into a fetal position on your side. If your pain worsens with extension-based activities (prolonged standing, working overhead), avoid resting in an extended position such as sleeping on your stomach. Most people will find more comfort when they rest with their spine in a more neutral position, neither too rounded nor too   arched. Lying on a non-sagging bed on your side with a pillow between your knees, or on your back with a pillow under your knees will often provide some relief. Keep in mind, being in any one position for a prolonged period of time, no matter how correct your posture, can still lead to stiffness. PROPER SITTING POSTURE In order to minimize stress and discomfort on your spine, you must sit with correct posture Sitting with good posture should be effortless for a healthy body. Returning to good posture is a gradual process. Many people can work toward this most comfortably by using various supports until they have the flexibility and strength to maintain this posture on their own. When sitting with proper posture, your ears will fall over your shoulders and your shoulders will fall over your hips. You should use the back of the chair to support your upper back. Your low back will be in a neutral position, just slightly arched. You may place a small pillow or folded towel at the base of your low back for support.  When  working at a desk, create an environment that supports good, upright posture. Without extra support, muscles fatigue and lead to excessive strain on joints and other tissues. Keep these recommendations in mind: CHAIR:   A chair should be able to slide under your desk when your back makes contact with the back of the chair. This allows you to work closely.  The chair's height should allow your eyes to be level with the upper part of your monitor and your hands to be slightly lower than your elbows. BODY POSITION  Your feet should make contact with the floor. If this is not possible, use a foot rest.  Keep your ears over your shoulders. This will reduce stress on your neck and low back. INCORRECT SITTING POSTURES   If you are feeling tired and unable to assume a healthy sitting posture, do not slouch or slump. This puts excessive strain on your back tissues, causing more damage and pain. Healthier options include:  Using more support, like a lumbar pillow.  Switching tasks to something that requires you to be upright or walking.  Talking a brief walk.  Lying down to rest in a neutral-spine position. PROLONGED STANDING WHILE SLIGHTLY LEANING FORWARD  When completing a task that requires you to lean forward while standing in one place for a long time, place either foot up on a stationary 2-4 inch high object to help maintain the best posture. When both feet are on the ground, the low back tends to lose its slight inward curve. If this curve flattens (or becomes too large), then the back and your other joints will experience too much stress, fatigue more quickly and can cause pain.  CORRECT STANDING POSTURES Proper standing posture should be assumed with all daily activities, even if they only take a few moments, like when brushing your teeth. As in sitting, your ears should fall over your shoulders and your shoulders should fall over your hips. You should keep a slight tension in your abdominal  muscles to brace your spine. Your tailbone should point down to the ground, not behind your body, resulting in an over-extended swayback posture.  INCORRECT STANDING POSTURES  Common incorrect standing postures include a forward head, locked knees and/or an excessive swayback. WALKING Walk with an upright posture. Your ears, shoulders and hips should all line-up. PROLONGED ACTIVITY IN A FLEXED POSITION When completing a task that requires you to bend forward   at your waist or lean over a low surface, try to find a way to stabilize 3 of 4 of your limbs. You can place a hand or elbow on your thigh or rest a knee on the surface you are reaching across. This will provide you more stability so that your muscles do not fatigue as quickly. By keeping your knees relaxed, or slightly bent, you will also reduce stress across your low back. CORRECT LIFTING TECHNIQUES DO :   Assume a wide stance. This will provide you more stability and the opportunity to get as close as possible to the object which you are lifting.  Tense your abdominals to brace your spine; then bend at the knees and hips. Keeping your back locked in a neutral-spine position, lift using your leg muscles. Lift with your legs, keeping your back straight.  Test the weight of unknown objects before attempting to lift them.  Try to keep your elbows locked down at your sides in order get the best strength from your shoulders when carrying an object.  Always ask for help when lifting heavy or awkward objects. INCORRECT LIFTING TECHNIQUES DO NOT:   Lock your knees when lifting, even if it is a small object.  Bend and twist. Pivot at your feet or move your feet when needing to change directions.  Assume that you cannot safely pick up a paperclip without proper posture. Document Released: 03/24/2005 Document Revised: 08/08/2013 Document Reviewed: 07/06/2008 [employer] Patient Information 2015 [employer]. This information is not intended to  replace advice given to you by your health care provider. Make sure you discuss any questions you have with your health care provider.  

## 2014-09-20 NOTE — Telephone Encounter (Signed)
Patient evaluated in office today.  Refill provided.

## 2014-10-04 ENCOUNTER — Encounter: Payer: Self-pay | Admitting: Family Medicine

## 2014-10-06 ENCOUNTER — Encounter: Payer: Self-pay | Admitting: Family Medicine

## 2014-10-06 DIAGNOSIS — M5431 Sciatica, right side: Secondary | ICD-10-CM

## 2014-10-12 MED ORDER — CELECOXIB 200 MG PO CAPS: 200.0000 mg | ORAL_CAPSULE | Freq: Two times a day (BID) | ORAL | Status: DC

## 2014-10-16 ENCOUNTER — Telehealth: Payer: Self-pay

## 2014-10-16 NOTE — Telephone Encounter (Signed)
Pt in need of her HYDROCODONE 5-325 MG. States she doesn't want to go the weekend without it. Please call [phone]

## 2014-10-18 ENCOUNTER — Other Ambulatory Visit: Payer: Self-pay | Admitting: Family Medicine

## 2014-10-18 DIAGNOSIS — N809 Endometriosis, unspecified: Secondary | ICD-10-CM

## 2014-10-18 DIAGNOSIS — N83209 Unspecified ovarian cyst, unspecified side: Secondary | ICD-10-CM

## 2014-10-18 MED ORDER — HYDROCODONE-ACETAMINOPHEN 5-325 MG PO TABS: 1.0000 | ORAL_TABLET | Freq: Two times a day (BID) | ORAL | Status: DC

## 2014-10-18 NOTE — Telephone Encounter (Signed)
Left message Rx ready to pick up.

## 2014-10-18 NOTE — Telephone Encounter (Signed)
Please refill hydrocodone 5/325 one po bid #[phone] no refills rx for patient for R sciatica and R ovarian cyst since I am out of town.  Thanks!

## 2014-10-18 NOTE — Telephone Encounter (Signed)
Please call patient and let her know that her prescription is ready at 102.

## 2014-10-24 ENCOUNTER — Ambulatory Visit
Admission: RE | Admit: 2014-10-24 | Discharge: 2014-10-24 | Disposition: A | Discharge status: Home / Self Care | Payer: Medicare Other | Source: Ambulatory Visit | Attending: Family Medicine | Admitting: Family Medicine

## 2014-10-24 DIAGNOSIS — N83202 Unspecified ovarian cyst, left side: Secondary | ICD-10-CM

## 2014-10-24 DIAGNOSIS — N8320 Unspecified ovarian cysts: Secondary | ICD-10-CM | POA: Diagnosis not present

## 2014-10-29 ENCOUNTER — Encounter: Payer: Self-pay | Admitting: Family Medicine

## 2014-11-01 ENCOUNTER — Telehealth: Payer: Self-pay | Admitting: Family Medicine

## 2014-11-01 NOTE — Telephone Encounter (Signed)
Patient is scheduled for a physical on Monday August 1 but she had a physical on 03/24/14 with Dr. Marin Comment.  Would like to verify with her that her insurance will pay so that she is not billed for the entire visit.

## 2014-11-06 ENCOUNTER — Encounter: Payer: Medicare Other | Admitting: Family Medicine

## 2014-11-09 ENCOUNTER — Telehealth: Payer: Self-pay

## 2014-11-09 DIAGNOSIS — G894 Chronic pain syndrome: Secondary | ICD-10-CM | POA: Diagnosis not present

## 2014-11-09 DIAGNOSIS — M5431 Sciatica, right side: Secondary | ICD-10-CM

## 2014-11-09 DIAGNOSIS — M4726 Other spondylosis with radiculopathy, lumbar region: Secondary | ICD-10-CM | POA: Diagnosis not present

## 2014-11-09 DIAGNOSIS — M41125 Adolescent idiopathic scoliosis, thoracolumbar region: Secondary | ICD-10-CM | POA: Diagnosis not present

## 2014-11-09 DIAGNOSIS — Z79899 Other long term (current) drug therapy: Secondary | ICD-10-CM | POA: Diagnosis not present

## 2014-11-09 DIAGNOSIS — Z79891 Long term (current) use of opiate analgesic: Secondary | ICD-10-CM | POA: Diagnosis not present

## 2014-11-09 DIAGNOSIS — M545 Low back pain: Secondary | ICD-10-CM | POA: Diagnosis not present

## 2014-11-09 DIAGNOSIS — M5116 Intervertebral disc disorders with radiculopathy, lumbar region: Secondary | ICD-10-CM | POA: Diagnosis not present

## 2014-11-09 NOTE — Telephone Encounter (Signed)
Referral placed.

## 2014-11-09 NOTE — Telephone Encounter (Signed)
Beth with [facility] is needing a new rx for the pt spine specialist at [place] they attempted to call her at least 3 times with no return call but now she is coming in office to try and schedule and they need a new rx from dr Tamala Julian before they ca n schedule please fax att: beth at [phone]

## 2014-11-14 DIAGNOSIS — F4312 Post-traumatic stress disorder, chronic: Secondary | ICD-10-CM | POA: Diagnosis not present

## 2014-11-14 DIAGNOSIS — F3132 Bipolar disorder, current episode depressed, moderate: Secondary | ICD-10-CM | POA: Diagnosis not present

## 2014-11-17 ENCOUNTER — Telehealth: Payer: Self-pay

## 2014-11-17 NOTE — Telephone Encounter (Signed)
Our office has referred patient to [facility] for PT for right sciatica and they are requesting a PT script. Per their office it is required because patient has medicare. Please fax script to [phone]

## 2014-11-17 NOTE — Telephone Encounter (Signed)
Beverly at [facility] called again stating patient is scheduled for Tuesday 11/21/14 for physical therapy and they will need the PT script. She stated they would have to cancel patients appointment if they don't receive it. Please fax to [phone]

## 2014-11-20 MED ORDER — NON FORMULARY: Status: DC

## 2014-11-20 NOTE — Telephone Encounter (Signed)
Beth at [facility] calling back again stating she needs a PT Script for patient as soon as possible. Patient scheduled tomorrow 11/21/14 and they will have to cancel her appointment if not received. Please fax to [phone] [facility]. If any questions contact beth at [phone]

## 2014-11-20 NOTE — Telephone Encounter (Signed)
Rx faxed

## 2014-11-20 NOTE — Telephone Encounter (Signed)
Script printed. Please fax.

## 2014-11-20 NOTE — Telephone Encounter (Signed)
Please help.

## 2014-11-21 DIAGNOSIS — M5116 Intervertebral disc disorders with radiculopathy, lumbar region: Secondary | ICD-10-CM | POA: Diagnosis not present

## 2014-12-10 ENCOUNTER — Encounter (HOSPITAL_COMMUNITY): Payer: Self-pay | Admitting: Nurse Practitioner

## 2014-12-10 ENCOUNTER — Emergency Department (HOSPITAL_COMMUNITY)
Admission: EM | Admit: 2014-12-10 | Discharge: 2014-12-10 | Disposition: A | Discharge status: Home / Self Care | Payer: Medicare Other | Attending: Emergency Medicine | Admitting: Emergency Medicine

## 2014-12-10 DIAGNOSIS — F419 Anxiety disorder, unspecified: Secondary | ICD-10-CM | POA: Diagnosis not present

## 2014-12-10 DIAGNOSIS — F329 Major depressive disorder, single episode, unspecified: Secondary | ICD-10-CM | POA: Diagnosis not present

## 2014-12-10 DIAGNOSIS — L989 Disorder of the skin and subcutaneous tissue, unspecified: Secondary | ICD-10-CM | POA: Diagnosis present

## 2014-12-10 DIAGNOSIS — M1990 Unspecified osteoarthritis, unspecified site: Secondary | ICD-10-CM | POA: Diagnosis not present

## 2014-12-10 DIAGNOSIS — Z8742 Personal history of other diseases of the female genital tract: Secondary | ICD-10-CM | POA: Diagnosis not present

## 2014-12-10 DIAGNOSIS — Z880 Allergy status to penicillin: Secondary | ICD-10-CM | POA: Insufficient documentation

## 2014-12-10 DIAGNOSIS — Z79899 Other long term (current) drug therapy: Secondary | ICD-10-CM | POA: Diagnosis not present

## 2014-12-10 DIAGNOSIS — Z720 Tobacco use: Secondary | ICD-10-CM | POA: Diagnosis not present

## 2014-12-10 DIAGNOSIS — M419 Scoliosis, unspecified: Secondary | ICD-10-CM | POA: Diagnosis not present

## 2014-12-10 DIAGNOSIS — M542 Cervicalgia: Secondary | ICD-10-CM | POA: Insufficient documentation

## 2014-12-10 DIAGNOSIS — M7918 Myalgia, other site: Secondary | ICD-10-CM

## 2014-12-10 DIAGNOSIS — J45909 Unspecified asthma, uncomplicated: Secondary | ICD-10-CM | POA: Insufficient documentation

## 2014-12-10 NOTE — Discharge Instructions (Signed)
Musculoskeletal Pain Take Tylenol or Motrin for pain. Follow-up with her primary care physician. Return for increased swelling. Musculoskeletal pain is muscle and boney aches and pains. These pains can occur in any part of the body. Your caregiver may treat you without knowing the cause of the pain. They may treat you if blood or urine tests, X-rays, and other tests were normal.  CAUSES There is often not a definite cause or reason for these pains. These pains may be caused by a type of germ (virus). The discomfort may also come from overuse. Overuse includes working out too hard when your body is not fit. Boney aches also come from weather changes. Bone is sensitive to atmospheric pressure changes. HOME CARE INSTRUCTIONS   Ask when your test results will be ready. Make sure you get your test results.  Only take over-the-counter or prescription medicines for pain, discomfort, or fever as directed by your caregiver. If you were given medications for your condition, do not drive, operate machinery or power tools, or sign legal documents for 24 hours. Do not drink alcohol. Do not take sleeping pills or other medications that may interfere with treatment.  Continue all activities unless the activities cause more pain. When the pain lessens, slowly resume normal activities. Gradually increase the intensity and duration of the activities or exercise.  During periods of severe pain, bed rest may be helpful. Lay or sit in any position that is comfortable.  Putting ice on the injured area.  Put ice in a bag.  Place a towel between your skin and the bag.  Leave the ice on for 15 to 20 minutes, 3 to 4 times a day.  Follow up with your caregiver for continued problems and no reason can be found for the pain. If the pain becomes worse or does not go away, it may be necessary to repeat tests or do additional testing. Your caregiver may need to look further for a possible cause. SEEK IMMEDIATE MEDICAL CARE  IF:  You have pain that is getting worse and is not relieved by medications.  You develop chest pain that is associated with shortness or breath, sweating, feeling sick to your stomach (nauseous), or throw up (vomit).  Your pain becomes localized to the abdomen.  You develop any new symptoms that seem different or that concern you. MAKE SURE YOU:   Understand these instructions.  Will watch your condition.  Will get help right away if you are not doing well or get worse. Document Released: 03/24/2005 Document Revised: 06/16/2011 Document Reviewed: 11/26/2012 [facility] Patient Information 2015 [place], [place]. This information is not intended to replace advice given to you by your health care provider. Make sure you discuss any questions you have with your health care provider.

## 2014-12-10 NOTE — ED Provider Notes (Signed)
CSN: [id number]     Arrival date & time 12/10/14  1721 History   First MD Initiated Contact with Patient 12/10/14 1812     Chief Complaint  Patient presents with  . Skin Problem     (Consider location/radiation/quality/duration/timing/severity/associated sxs/prior Treatment) The history is provided by the patient. No language interpreter was used.   Catherine Olsen is a 44 year old female who presents for right sided posterior neck knot for the past 3 days. She states that she took ibuprofen for pain. Nothing makes it better or worse. She states she spoke to her brother who informed her to come to the ED due to her medical history. "I just want to make sure I do not have cancer." She denies any fever, chills, decreased range of motion of her neck, headache, dizziness, recent fall or injury.  Past Medical History  Diagnosis Date  . Asthma   . Anxiety   . Mental disorder   . Bipolar 1 disorder   . Endometriosis   . PONV (postoperative nausea and vomiting)   . Termination of pregnancy     x 2 at age 69 and 44 yrs old  . Depression   . Headache(784.0)     otc med prn  . Arthritis     hands, lower back, knees  . Fibromyalgia   . Scoliosis   . Scoliosis   . Allergy    Past Surgical History  Procedure Laterality Date  . Nose surgery      rhinoplasty at age 24 yrs  . Wisdom tooth extraction    . Facial cosmetic surgery      right cheek  . Laparoscopy Right 11/11/2013    Procedure: LAPAROSCOPY OPERATIVE with  Drainage of  RIGHT Ovarian ENDOMETRIOMA;  Surgeon: Elveria Royals, MD;  Location: [facility];  Service: Gynecology;  Laterality: Right;  . Dilation and curettage of uterus    . Breast surgery     Family History  Problem Relation Age of Onset  . Diabetes Mother   . Hypertension Mother   . Stroke Mother   . Mental illness Mother   . Heart disease Father   . Hyperlipidemia Father   . Hypertension Father   . Diabetes Maternal Grandmother   . Heart disease Maternal Grandmother   .  Hyperlipidemia Maternal Grandmother   . Hypertension Maternal Grandmother   . Mental illness Maternal Grandmother   . Heart disease Maternal Grandfather   . Hyperlipidemia Maternal Grandfather   . Hypertension Maternal Grandfather   . Heart disease Paternal Grandfather   . Hyperlipidemia Paternal Grandfather   . Hypertension Paternal Grandfather   . Stroke Paternal Grandfather    Social History  Substance Use Topics  . Smoking status: Current Every Day Smoker -- 29 years    Types: Cigarettes, E-cigarettes  . Smokeless tobacco: Never Used  . Alcohol Use: No     Comment: "rare"   OB History    Gravida Para Term Preterm AB TAB SAB Ectopic Multiple Living   2    2 2     0     Review of Systems  Constitutional: Negative for fever and chills.  Musculoskeletal: Positive for neck pain. Negative for back pain and neck stiffness.      Allergies  Sulfamethoxazole; Oxycodone; Penicillins; Other; Percocet; Sulfa antibiotics; and Sulfites  Home Medications   Prior to Admission medications   Medication Sig Start Date End Date Taking? Authorizing Provider  albuterol (PROVENTIL HFA;VENTOLIN HFA) 108 (90 BASE) MCG/ACT inhaler Inhale  2 puffs into the lungs every 6 (six) hours as needed for wheezing or shortness of breath (cough, shortness of breath or wheezing.). 09/20/14   Wardell Honour, MD  ARIPiprazole (ABILIFY) 5 MG tablet Take 5 mg by mouth daily. 07/04/14   Historical Provider, MD  celecoxib (CELEBREX) 200 MG capsule Take 1 capsule (200 mg total) by mouth 2 (two) times daily. 10/12/14   Wardell Honour, MD  chlorhexidine (PERIDEX) 0.12 % solution Use as directed 15 mLs in the mouth or throat 2 (two) times daily. Patient not taking: Reported on 09/20/2014 08/24/14   Wardell Honour, MD  clindamycin (CLEOCIN) 150 MG capsule Take 2 capsules (300 mg total) by mouth 3 (three) times daily. Patient not taking: Reported on 09/20/2014 08/24/14   Wardell Honour, MD  clonazePAM (KLONOPIN) 2 MG tablet Take  4 mg by mouth at bedtime as needed. For sleep 06/28/14   Historical Provider, MD  diphenhydrAMINE (BENADRYL) 25 MG tablet Take 50-100 mg by mouth every 8 (eight) hours as needed for itching, allergies or sleep.     Historical Provider, MD  EPINEPHrine (EPIPEN 2-PAK) 0.3 mg/0.3 mL IJ SOAJ injection Inject 0.3 mLs (0.3 mg total) into the muscle once. 09/20/14   Wardell Honour, MD  HYDROcodone-acetaminophen (NORCO) 5-325 MG per tablet Take 1 tablet by mouth 2 (two) times daily. 10/18/14   Elby Beck, FNP  ibuprofen (ADVIL,MOTRIN) 800 MG tablet Take 1 tablet (800 mg total) by mouth 3 (three) times daily. 08/24/14   Wardell Honour, MD  Magnesium 250 MG TABS Take 250 mg by mouth daily.     Historical Provider, MD  Multiple Vitamin (MULTIVITAMIN WITH MINERALS) TABS tablet Take 1 tablet by mouth daily.    Historical Provider, MD  nicotine (NICODERM CQ - DOSED IN MG/24 HOURS) 21 mg/24hr patch  06/18/14   Historical Provider, MD  NON FORMULARY Please refer to Physical Therapy for management of Sciatica. 11/20/14   Jaynee Eagles, PA-C  predniSONE (DELTASONE) 20 MG tablet Two tablets daily x 5 days then one tablet daily x 5 days Patient not taking: Reported on 09/20/2014 08/31/14   Wardell Honour, MD   BP 110/82 mmHg  Pulse 79  Temp(Src) 97.9 F (36.6 C)  Resp 18  Ht 5\' 8"  (1.727 m)  Wt 133 lb 6.4 oz (60.51 kg)  BMI 20.29 kg/m2  SpO2 99% Physical Exam  Constitutional: She is oriented to person, place, and time. She appears well-developed and well-nourished.  HENT:  Head: Normocephalic and atraumatic.  Eyes: Conjunctivae are normal.  Neck: Normal range of motion.  Cardiovascular: Normal rate.   Pulmonary/Chest: Effort normal. No respiratory distress.  Abdominal: Soft.  Musculoskeletal: Normal range of motion.  Neurological: She is alert and oriented to person, place, and time.  Skin: Skin is warm and dry.  There is no palpable knot on the posterior neck. She has no midline cervical tenderness. Full  range of motion of her neck including flexion and extension without pain or difficulty.  Psychiatric: She has a normal mood and affect. Her behavior is normal.  Nursing note and vitals reviewed.   ED Course  Procedures (including critical care time) Labs Review Labs Reviewed - No data to display  Imaging Review No results found.   EKG Interpretation None      MDM   Final diagnoses:  Musculoskeletal pain   Patient presents for a knot on posterior right side of her neck. This is most likely musculoskeletal but I  could not palpate a knot. She states that she has Percocet and several other pain medications at home if the knot became painful. I explained that she should follow up with her primary care physician. I gave her return precautions. Patient verbally agreed with the plan. Medications - No data to display     Ottie Glazier, PA-C 12/10/14 1924  Serita Grit, MD 12/13/14 1410

## 2014-12-10 NOTE — ED Notes (Signed)
Declined W/C at D/C and was escorted to lobby by RN. 

## 2014-12-10 NOTE — ED Notes (Signed)
Pt reports palpating a painless "knot" under skin on R posterior/shoulder neck area 2 days ago that she would like to have checked

## 2014-12-21 DIAGNOSIS — M4124 Other idiopathic scoliosis, thoracic region: Secondary | ICD-10-CM | POA: Diagnosis not present

## 2014-12-21 DIAGNOSIS — M545 Low back pain: Secondary | ICD-10-CM | POA: Diagnosis not present

## 2015-01-13 ENCOUNTER — Ambulatory Visit (INDEPENDENT_AMBULATORY_CARE_PROVIDER_SITE_OTHER): Payer: Medicare Other | Admitting: Family Medicine

## 2015-01-13 VITALS — BP 116/74 | HR 75 | Temp 97.7°F | Resp 18 | Ht 67.75 in | Wt 131.0 lb

## 2015-01-13 DIAGNOSIS — M5431 Sciatica, right side: Secondary | ICD-10-CM | POA: Diagnosis not present

## 2015-01-13 DIAGNOSIS — N83201 Unspecified ovarian cyst, right side: Secondary | ICD-10-CM

## 2015-01-13 DIAGNOSIS — L989 Disorder of the skin and subcutaneous tissue, unspecified: Secondary | ICD-10-CM

## 2015-01-13 DIAGNOSIS — E049 Nontoxic goiter, unspecified: Secondary | ICD-10-CM | POA: Diagnosis not present

## 2015-01-13 DIAGNOSIS — L83 Acanthosis nigricans: Secondary | ICD-10-CM | POA: Diagnosis not present

## 2015-01-13 NOTE — Progress Notes (Signed)
Subjective:    Patient ID: Catherine Olsen, female    DOB: Jul 20, 1970, 44 y.o.   MRN: [id number]  01/13/2015  Nevus and Neck Pain   HPI This 44 y.o. female presents for evaluation of possible thyroid nodule on R.  Has noticed swollen neck on R.  Has started to perform neck exercises for wrinkling neck.  TSH 1.096 in 05/18/2014; free T4 WNL. Wants thyroid US.  Moles on pubic region; wants reviewed; has changed in character and size.    R ovarian endometrioma:   Will be due for repeat pelvic US in 04/2015; not having pain right now; hurting just a little bit.  Menses sporadic again.  Menses in 11/2014; then had menses two weeks ago.  At [facility], expect early menopause.   Sciatica: s/p physical therapy with consultation with ortho.  Doing better.    Review of Systems  Constitutional: Negative for fever, chills, diaphoresis and fatigue.  Eyes: Negative for visual disturbance.  Respiratory: Negative for cough and shortness of breath.   Cardiovascular: Negative for chest pain, palpitations and leg swelling.  Gastrointestinal: Negative for nausea, vomiting, abdominal pain, diarrhea and constipation.  Endocrine: Negative for cold intolerance, heat intolerance, polydipsia, polyphagia and polyuria.  Genitourinary: Positive for pelvic pain.  Musculoskeletal: Positive for back pain.  Neurological: Negative for dizziness, tremors, seizures, syncope, facial asymmetry, speech difficulty, weakness, light-headedness, numbness and headaches.  Psychiatric/Behavioral: The patient is nervous/anxious.     Past Medical History  Diagnosis Date  . Asthma   . Anxiety   . Mental disorder   . Bipolar 1 disorder ([place])   . Endometriosis   . PONV (postoperative nausea and vomiting)   . Termination of pregnancy     x 2 at age 41 and 44 yrs old  . Depression   . Headache(784.0)     otc med prn  . Arthritis     hands, lower back, knees  . Fibromyalgia   . Scoliosis   . Scoliosis   . Allergy    Past  Surgical History  Procedure Laterality Date  . Nose surgery      rhinoplasty at age 17 yrs  . Wisdom tooth extraction    . Facial cosmetic surgery      right cheek  . Laparoscopy Right 11/11/2013    Procedure: LAPAROSCOPY OPERATIVE with  Drainage of  RIGHT Ovarian ENDOMETRIOMA;  Surgeon: Elveria Royals, MD;  Location: [facility];  Service: Gynecology;  Laterality: Right;  . Dilation and curettage of uterus    . Breast surgery     Allergies  Allergen Reactions  . Sulfamethoxazole Anaphylaxis  . Oxycodone Other (See Comments)    Severe blindness?  . Penicillins Hives  . Other Anxiety    steroids  . Percocet [Oxycodone-Acetaminophen] Palpitations  . Sulfa Antibiotics Hives  . Sulfites Other (See Comments)    unknown   Current Outpatient Prescriptions  Medication Sig Dispense Refill  . albuterol (PROVENTIL HFA;VENTOLIN HFA) 108 (90 BASE) MCG/ACT inhaler Inhale 2 puffs into the lungs every 6 (six) hours as needed for wheezing or shortness of breath (cough, shortness of breath or wheezing.). 1 Inhaler 1  . celecoxib (CELEBREX) 200 MG capsule Take 1 capsule (200 mg total) by mouth 2 (two) times daily. 60 capsule 2  . clonazePAM (KLONOPIN) 2 MG tablet Take 4 mg by mouth at bedtime as needed. For sleep  1  . diphenhydrAMINE (BENADRYL) 25 MG tablet Take 50-100 mg by mouth every 8 (eight) hours as needed  for itching, allergies or sleep.     Marland Kitchen EPINEPHrine (EPIPEN 2-PAK) 0.3 mg/0.3 mL IJ SOAJ injection Inject 0.3 mLs (0.3 mg total) into the muscle once. 1 Device 1  . Magnesium 250 MG TABS Take 250 mg by mouth daily.     . Multiple Vitamin (MULTIVITAMIN WITH MINERALS) TABS tablet Take 1 tablet by mouth daily. Vitamin protein shake    . acetaminophen (TYLENOL) 500 MG tablet Take 1 tablet (500 mg total) by mouth every 6 (six) hours as needed. 30 tablet 0  . ARIPiprazole (ABILIFY) 5 MG tablet Take 5 mg by mouth daily.    Marland Kitchen HYDROcodone-acetaminophen (NORCO/VICODIN) 5-325 MG tablet Take 1 tablet by mouth  every 6 (six) hours as needed for moderate pain.    . nicotine (NICODERM CQ - DOSED IN MG/24 HOURS) 21 mg/24hr patch   1   No current facility-administered medications for this visit.   Social History   Social History  . Marital Status: Divorced    Spouse Name: N/A  . Number of Children: 0  . Years of Education: N/A   Occupational History  . disability     mental illness   Social History Main Topics  . Smoking status: Current Every Day Smoker -- 29 years    Types: Cigarettes, E-cigarettes  . Smokeless tobacco: Never Used  . Alcohol Use: No     Comment: "rare"  . 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 Sexual Activity: No     Comment: abortion at 67 and 31yrs. of age    Other Topics Concern  . Not on file   Social History Narrative   Marital status: divorced; not dating      Children: none      Lives: alone with mom      Employment: disability for mental illness in 1997.  Hospitalizations x 9 in past.      Tobacco: 1 ppd x since age 89.      Alcohol: socially; beers.      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      Family History  Problem Relation Age of Onset  . Diabetes Mother   . Hypertension Mother   . Stroke Mother 40    CVA  . Mental illness Mother     no diagnosis; personality disorder  . Hyperlipidemia Father   . Hypertension Father   . COPD Father   . Alpha-1 antitrypsin deficiency Father   . Diabetes Maternal Grandmother   . Heart disease Maternal Grandmother   . Hyperlipidemia Maternal Grandmother   . Hypertension Maternal Grandmother   . Mental illness Maternal Grandmother   . Heart disease Maternal Grandfather   . Hyperlipidemia Maternal Grandfather   . Hypertension Maternal Grandfather   . Heart disease Paternal Grandfather   . Hyperlipidemia Paternal Grandfather   . Hypertension Paternal Grandfather   . Stroke Paternal Grandfather   . Alpha-1 antitrypsin  deficiency Brother        Objective:    BP 116/74 mmHg  Pulse 75  Temp(Src) 97.7 F (36.5 C) (Oral)  Resp 18  Ht 5' 7.75" (1.721 m)  Wt 131 lb (59.421 kg)  BMI 20.06 kg/m2  SpO2 98% Physical Exam  Constitutional: She is oriented to person, place, and time. She appears well-developed and well-nourished. No distress.  HENT:  Head: Normocephalic and atraumatic.  Right Ear: External ear normal.  Left Ear: External ear normal.  Nose: Nose normal.  Mouth/Throat: Oropharynx is clear and moist.  Eyes: Conjunctivae and EOM are normal. Pupils are equal, round, and reactive to light.  Neck: Normal range of motion. Neck supple. Carotid bruit is not present. No thyromegaly present.  Cardiovascular: Normal rate, regular rhythm, normal heart sounds and intact distal pulses.  Exam reveals no gallop and no friction rub.   No murmur heard. Pulmonary/Chest: Effort normal and breath sounds normal. She has no wheezes. She has no rales.  Abdominal: Soft. Bowel sounds are normal. She exhibits no distension and no mass. There is no tenderness. There is no rebound and no guarding.  Lymphadenopathy:    She has no cervical adenopathy.  Neurological: She is alert and oriented to person, place, and time. No cranial nerve deficit.  Skin: Skin is warm and dry. No rash noted. She is not diaphoretic. No erythema. No pallor.  In suprapubic region, skin lesion with slight color variation.  Psychiatric: Her speech is normal. Judgment and thought content normal. Her mood appears anxious. Her affect is blunt. She is agitated. Cognition and memory are normal.   Results for orders placed or performed during the hospital encounter of 07/09/14  CBC with Differential  Result Value Ref Range   WBC 8.0 4.0 - 10.5 K/uL   RBC 4.79 3.87 - 5.11 MIL/uL   Hemoglobin 15.3 (H) 12.0 - 15.0 g/dL   HCT 43.2 36.0 - 46.0 %   MCV 90.2 78.0 - 100.0 fL   MCH 31.9 26.0 - 34.0 pg   MCHC 35.4 30.0 - 36.0 g/dL   RDW 13.5 11.5 - 15.5 %    Platelets 261 150 - 400 K/uL   Neutrophils Relative % 73 43 - 77 %   Neutro Abs 5.8 1.7 - 7.7 K/uL   Lymphocytes Relative 21 12 - 46 %   Lymphs Abs 1.7 0.7 - 4.0 K/uL   Monocytes Relative 4 3 - 12 %   Monocytes Absolute 0.3 0.1 - 1.0 K/uL   Eosinophils Relative 2 0 - 5 %   Eosinophils Absolute 0.2 0.0 - 0.7 K/uL   Basophils Relative 0 0 - 1 %   Basophils Absolute 0.0 0.0 - 0.1 K/uL  Comprehensive metabolic panel  Result Value Ref Range   Sodium 134 (L) 135 - 145 mmol/L   Potassium 4.1 3.5 - 5.1 mmol/L   Chloride 102 96 - 112 mmol/L   CO2 19 19 - 32 mmol/L   Glucose, Bld 89 70 - 99 mg/dL   BUN 18 6 - 23 mg/dL   Creatinine, Ser 0.77 0.50 - 1.10 mg/dL   Calcium 8.6 8.4 - 10.5 mg/dL   Total Protein 6.9 6.0 - 8.3 g/dL   Albumin 4.1 3.5 - 5.2 g/dL   AST 20 0 - 37 U/L   ALT 14 0 - 35 U/L   Alkaline Phosphatase 62 39 - 117 U/L   Total Bilirubin 0.7 0.3 - 1.2 mg/dL   GFR calc non Af Amer >90 >90 mL/min   GFR calc Af Amer >90 >90 mL/min   Anion gap 13 5 - 15   PROCEDURE: VERBAL CONSENT OBTAINED; 2% LIDOCAINE WITH EPI 1 CC ADMINISTERED UNDER SUPRAPUBIC REGION; SHAVE BIOPSY/EXCISION OF SKIN LESION OBTAINED; SILVER NITRATE ADMINISTERED FOR HEMOSTASIS; GOOD HEMOSTASIS OBTAINED.     Assessment & Plan:   1. Enlargement of thyroid   2. Changing skin lesion   3. Right ovarian cyst    1.  Possible thyroid enlargement:  New.  Obtain thyroid US. 2.  Changing skin lesion: New. S/p excisional biopsy of lesion; send for pathology. 3.  R ovarian cyst: persistent; continue to monitor; pt does not desire surgical resection unless enlarges. 4. Sciatica R: improving.  Orders Placed This Encounter  Procedures  . US Soft Tissue Head/Neck    Wt-131/no prev/no needs/epic order/[facility]/medicaid    Standing Status: Future     Number of Occurrences: 1     Standing Expiration Date: 03/14/2016    Order Specific Question:  Reason for Exam (SYMPTOM  OR DIAGNOSIS REQUIRED)    Answer:  possible  thyroid nodule on R    Order Specific Question:  Preferred imaging location?    Answer:  [facility]   No orders of the defined types were placed in this encounter.    No Follow-up on file.     Mollyann Halbert Elayne Guerin, M.D. [facility] [address] [place], [place]  [address] [phone] phone [phone] fax

## 2015-01-15 ENCOUNTER — Ambulatory Visit
Admission: RE | Admit: 2015-01-15 | Discharge: 2015-01-15 | Disposition: A | Discharge status: Home / Self Care | Payer: Medicare Other | Source: Ambulatory Visit | Attending: Family Medicine | Admitting: Family Medicine

## 2015-01-15 DIAGNOSIS — E049 Nontoxic goiter, unspecified: Secondary | ICD-10-CM

## 2015-01-15 DIAGNOSIS — E041 Nontoxic single thyroid nodule: Secondary | ICD-10-CM | POA: Diagnosis not present

## 2015-01-17 ENCOUNTER — Encounter: Payer: Self-pay | Admitting: Family Medicine

## 2015-01-17 ENCOUNTER — Other Ambulatory Visit: Payer: Self-pay | Admitting: Family Medicine

## 2015-01-17 ENCOUNTER — Telehealth: Payer: Self-pay

## 2015-01-17 NOTE — Telephone Encounter (Signed)
Called and spoke with patient informed her that thyroid was normal according to Ultrasound. Pt understood.

## 2015-01-17 NOTE — Telephone Encounter (Signed)
Pt is needing to talk with dr Tamala Julian about her ultrasound results   Best number is [phone]

## 2015-01-22 ENCOUNTER — Encounter (HOSPITAL_COMMUNITY): Payer: Self-pay | Admitting: *Deleted

## 2015-01-22 ENCOUNTER — Emergency Department (HOSPITAL_COMMUNITY)
Admission: EM | Admit: 2015-01-22 | Discharge: 2015-01-22 | Disposition: A | Discharge status: Home / Self Care | Payer: Medicare Other | Attending: Emergency Medicine | Admitting: Emergency Medicine

## 2015-01-22 DIAGNOSIS — R1032 Left lower quadrant pain: Secondary | ICD-10-CM | POA: Diagnosis not present

## 2015-01-22 DIAGNOSIS — M1990 Unspecified osteoarthritis, unspecified site: Secondary | ICD-10-CM | POA: Insufficient documentation

## 2015-01-22 DIAGNOSIS — Z880 Allergy status to penicillin: Secondary | ICD-10-CM | POA: Diagnosis not present

## 2015-01-22 DIAGNOSIS — Z3202 Encounter for pregnancy test, result negative: Secondary | ICD-10-CM | POA: Diagnosis not present

## 2015-01-22 DIAGNOSIS — Z8742 Personal history of other diseases of the female genital tract: Secondary | ICD-10-CM | POA: Insufficient documentation

## 2015-01-22 DIAGNOSIS — J45909 Unspecified asthma, uncomplicated: Secondary | ICD-10-CM | POA: Insufficient documentation

## 2015-01-22 DIAGNOSIS — F329 Major depressive disorder, single episode, unspecified: Secondary | ICD-10-CM | POA: Insufficient documentation

## 2015-01-22 DIAGNOSIS — Z79899 Other long term (current) drug therapy: Secondary | ICD-10-CM | POA: Diagnosis not present

## 2015-01-22 DIAGNOSIS — Z791 Long term (current) use of non-steroidal anti-inflammatories (NSAID): Secondary | ICD-10-CM | POA: Insufficient documentation

## 2015-01-22 DIAGNOSIS — F419 Anxiety disorder, unspecified: Secondary | ICD-10-CM | POA: Diagnosis not present

## 2015-01-22 DIAGNOSIS — Z720 Tobacco use: Secondary | ICD-10-CM | POA: Insufficient documentation

## 2015-01-22 LAB — URINALYSIS, ROUTINE W REFLEX MICROSCOPIC
BILIRUBIN URINE: NEGATIVE
Glucose, UA: NEGATIVE mg/dL
HGB URINE DIPSTICK: NEGATIVE
KETONES UR: NEGATIVE mg/dL
Nitrite: NEGATIVE
PROTEIN: NEGATIVE mg/dL
Specific Gravity, Urine: 1.003 — ABNORMAL LOW (ref 1.005–1.030)
Urobilinogen, UA: 0.2 mg/dL (ref 0.0–1.0)
pH: 5.5 (ref 5.0–8.0)

## 2015-01-22 LAB — URINE MICROSCOPIC-ADD ON

## 2015-01-22 LAB — PREGNANCY, URINE: Preg Test, Ur: NEGATIVE

## 2015-01-22 MED ORDER — KETOROLAC TROMETHAMINE 60 MG/2ML IM SOLN
60.0000 mg | Freq: Once | INTRAMUSCULAR | Status: DC
  Filled 2015-01-22: qty 2

## 2015-01-22 MED ORDER — ACETAMINOPHEN 500 MG PO TABS: 500.0000 mg | ORAL_TABLET | Freq: Four times a day (QID) | ORAL | Status: DC | PRN

## 2015-01-22 MED ORDER — HYDROCODONE-ACETAMINOPHEN 5-325 MG PO TABS
1.0000 | ORAL_TABLET | Freq: Once | ORAL | Status: AC
  Administered 2015-01-22: 0.5 via ORAL
  Filled 2015-01-22: qty 1

## 2015-01-22 NOTE — ED Notes (Addendum)
Pt to ED c/o "L ovary pain" after doing a yoga pose yesterday. hx of endometriosis with a cyst on the "R ovary and was told at L ovary has a cyst as well". LMP two months ago, states "feeling like it's earlier menopause." Denies bleeding or discharge.

## 2015-01-22 NOTE — ED Provider Notes (Signed)
CSN: [id number]     Arrival date & time 01/22/15  3474 History  By signing my name below, I, Hansel Feinstein, attest that this documentation has been prepared under the direction and in the presence of Jola Schmidt, MD. Electronically Signed: Hansel Feinstein, ED Scribe. 01/22/2015. 3:54 AM.    Chief Complaint  Patient presents with  . Abdominal Pain   The history is provided by the patient. No language interpreter was used.    HPI Comments: Catherine Olsen is a 44 y.o. female with h/o asthma, endometriosis, bipolar I disorder who presents to the Emergency Department complaining of moderate, gradually worsening, sharp LLQ pain onset tonight after doing a yoga pose. She states pain is not similar to prior endometrioma on the right side. LMP 2 months ago. Pt has not been sexually active for 1.5 years. She states she takes Celebrex and cannot take Ibuprofen. No other treatments tried PTA. Pt states h/o abdominal surgery for ovarian cysts and endometriosis (1.5 yrs prior), recent ovarian cyst rupture 2 months ago. PCP is Dr. Reginia Forts. She denies nausea, vomiting, dysuria, hematuria, vaginal discharge or bleeding.   Past Medical History  Diagnosis Date  . Asthma   . Anxiety   . Mental disorder   . Bipolar 1 disorder ([place])   . Endometriosis   . PONV (postoperative nausea and vomiting)   . Termination of pregnancy     x 2 at age 82 and 44 yrs old  . Depression   . Headache(784.0)     otc med prn  . Arthritis     hands, lower back, knees  . Fibromyalgia   . Scoliosis   . Scoliosis   . Allergy    Past Surgical History  Procedure Laterality Date  . Nose surgery      rhinoplasty at age 47 yrs  . Wisdom tooth extraction    . Facial cosmetic surgery      right cheek  . Laparoscopy Right 11/11/2013    Procedure: LAPAROSCOPY OPERATIVE with  Drainage of  RIGHT Ovarian ENDOMETRIOMA;  Surgeon: Elveria Royals, MD;  Location: [facility];  Service: Gynecology;  Laterality: Right;  . Dilation and curettage of  uterus    . Breast surgery     Family History  Problem Relation Age of Onset  . Diabetes Mother   . Hypertension Mother   . Stroke Mother   . Mental illness Mother   . Heart disease Father   . Hyperlipidemia Father   . Hypertension Father   . Diabetes Maternal Grandmother   . Heart disease Maternal Grandmother   . Hyperlipidemia Maternal Grandmother   . Hypertension Maternal Grandmother   . Mental illness Maternal Grandmother   . Heart disease Maternal Grandfather   . Hyperlipidemia Maternal Grandfather   . Hypertension Maternal Grandfather   . Heart disease Paternal Grandfather   . Hyperlipidemia Paternal Grandfather   . Hypertension Paternal Grandfather   . Stroke Paternal Grandfather    Social History  Substance Use Topics  . Smoking status: Current Every Day Smoker -- 29 years    Types: Cigarettes, E-cigarettes  . Smokeless tobacco: Never Used  . Alcohol Use: No     Comment: "rare"   OB History    Gravida Para Term Preterm AB TAB SAB Ectopic Multiple Living   2    2 2     0     Review of Systems A complete 10 system review of systems was obtained and all systems are negative  except as noted in the HPI and PMH.    Allergies  Sulfamethoxazole; Oxycodone; Penicillins; Other; Percocet; Sulfa antibiotics; and Sulfites  Home Medications   Prior to Admission medications   Medication Sig Start Date End Date Taking? Authorizing Provider  albuterol (PROVENTIL HFA;VENTOLIN HFA) 108 (90 BASE) MCG/ACT inhaler Inhale 2 puffs into the lungs every 6 (six) hours as needed for wheezing or shortness of breath (cough, shortness of breath or wheezing.). 09/20/14  Yes Wardell Honour, MD  celecoxib (CELEBREX) 200 MG capsule Take 1 capsule (200 mg total) by mouth 2 (two) times daily. 10/12/14  Yes Wardell Honour, MD  EPINEPHrine (EPIPEN 2-PAK) 0.3 mg/0.3 mL IJ SOAJ injection Inject 0.3 mLs (0.3 mg total) into the muscle once. 09/20/14  Yes Wardell Honour, MD  ARIPiprazole (ABILIFY) 5 MG  tablet Take 5 mg by mouth daily. 07/04/14   Historical Provider, MD  chlorhexidine (PERIDEX) 0.12 % solution Use as directed 15 mLs in the mouth or throat 2 (two) times daily. Patient not taking: Reported on 09/20/2014 08/24/14   Wardell Honour, MD  clindamycin (CLEOCIN) 150 MG capsule Take 2 capsules (300 mg total) by mouth 3 (three) times daily. Patient not taking: Reported on 09/20/2014 08/24/14   Wardell Honour, MD  clonazePAM (KLONOPIN) 2 MG tablet Take 4 mg by mouth at bedtime as needed. For sleep 06/28/14   Historical Provider, MD  diphenhydrAMINE (BENADRYL) 25 MG tablet Take 50-100 mg by mouth every 8 (eight) hours as needed for itching, allergies or sleep.     Historical Provider, MD  HYDROcodone-acetaminophen (NORCO) 5-325 MG per tablet Take 1 tablet by mouth 2 (two) times daily. Patient not taking: Reported on 01/13/2015 10/18/14   Elby Beck, FNP  ibuprofen (ADVIL,MOTRIN) 800 MG tablet Take 1 tablet (800 mg total) by mouth 3 (three) times daily. 08/24/14   Wardell Honour, MD  Magnesium 250 MG TABS Take 250 mg by mouth daily.     Historical Provider, MD  Multiple Vitamin (MULTIVITAMIN WITH MINERALS) TABS tablet Take 1 tablet by mouth daily. Vitamin protein shake    Historical Provider, MD  nicotine (NICODERM CQ - DOSED IN MG/24 HOURS) 21 mg/24hr patch  06/18/14   Historical Provider, MD  NON FORMULARY Please refer to Physical Therapy for management of Sciatica. Patient not taking: Reported on 01/13/2015 11/20/14   Jaynee Eagles, PA-C  predniSONE (DELTASONE) 20 MG tablet Two tablets daily x 5 days then one tablet daily x 5 days Patient not taking: Reported on 09/20/2014 08/31/14   Wardell Honour, MD   BP 124/97 mmHg  Pulse 67  Temp(Src) 97.9 F (36.6 C) (Oral)  Resp 16  Ht 5\' 7"  (1.702 m)  Wt 130 lb (58.968 kg)  BMI 20.36 kg/m2  SpO2 100% Physical Exam  Constitutional: She is oriented to person, place, and time. She appears well-developed and well-nourished. No distress.  HENT:  Head:  Normocephalic and atraumatic.  Eyes: EOM are normal.  Neck: Normal range of motion.  Cardiovascular: Normal rate, regular rhythm and normal heart sounds.   Pulmonary/Chest: Effort normal and breath sounds normal.  Abdominal: Soft. She exhibits no distension. There is no tenderness.  Musculoskeletal: Normal range of motion.  Neurological: She is alert and oriented to person, place, and time.  Skin: Skin is warm and dry.  Psychiatric: She has a normal mood and affect. Judgment normal.  Nursing note and vitals reviewed.  ED Course  Procedures (including critical care time) DIAGNOSTIC STUDIES: Oxygen Saturation  is 98% on RA, normal by my interpretation.    COORDINATION OF CARE: 3:52 AM Discussed treatment plan with pt at bedside and pt agreed to plan.   Labs Review Labs Reviewed  URINALYSIS, ROUTINE W REFLEX MICROSCOPIC (NOT AT [facility])  PREGNANCY, URINE  I have personally reviewed and evaluated these lab results as part of my medical decision-making.  MDM   Final diagnoses:  None    No significant tenderness on examination.  This is likely musculoskeletal pain from her yoga position earlier today.  Doubt ovarian torsion.  No vaginal complaints.  No fevers or chills.  Primary care follow-up.  Patient understands return to the ER for new or worsening symptoms   I, Kristin Barcus M, personally performed the services described in this documentation. All medical record entries made by the scribe were at my direction and in my presence.  I have reviewed the chart and discharge instructions and agree that the record reflects my personal performance and is accurate and complete. Dianna Deshler M.  01/22/2015. 4:57 AM.       Jola Schmidt, MD 01/22/15 [id number]

## 2015-01-22 NOTE — Discharge Instructions (Signed)

## 2015-01-22 NOTE — ED Notes (Signed)
Pt left with all belongings and ambulated out of treatment area.  

## 2015-01-24 ENCOUNTER — Telehealth: Payer: Self-pay

## 2015-01-24 NOTE — Telephone Encounter (Signed)
Patient is returning a missed phone call. She thinks is might be a confirmation for her appointment but isn't sure.  Patient wasn't able to check voicemail on her new phone.

## 2015-01-26 ENCOUNTER — Ambulatory Visit (INDEPENDENT_AMBULATORY_CARE_PROVIDER_SITE_OTHER): Payer: Medicare Other | Admitting: Family Medicine

## 2015-01-26 ENCOUNTER — Encounter: Payer: Self-pay | Admitting: Family Medicine

## 2015-01-26 VITALS — BP 108/69 | HR 81 | Temp 98.4°F | Resp 16 | Ht 67.0 in | Wt 127.8 lb

## 2015-01-26 DIAGNOSIS — F3160 Bipolar disorder, current episode mixed, unspecified: Secondary | ICD-10-CM

## 2015-01-26 DIAGNOSIS — Z0000 Encounter for general adult medical examination without abnormal findings: Secondary | ICD-10-CM

## 2015-01-26 DIAGNOSIS — N926 Irregular menstruation, unspecified: Secondary | ICD-10-CM | POA: Diagnosis not present

## 2015-01-26 DIAGNOSIS — N801 Endometriosis of ovary: Secondary | ICD-10-CM | POA: Diagnosis not present

## 2015-01-26 DIAGNOSIS — Z720 Tobacco use: Secondary | ICD-10-CM | POA: Diagnosis not present

## 2015-01-26 DIAGNOSIS — N6019 Diffuse cystic mastopathy of unspecified breast: Secondary | ICD-10-CM | POA: Diagnosis not present

## 2015-01-26 DIAGNOSIS — Z124 Encounter for screening for malignant neoplasm of cervix: Secondary | ICD-10-CM

## 2015-01-26 DIAGNOSIS — N80129 Deep endometriosis of ovary, unspecified ovary: Secondary | ICD-10-CM

## 2015-01-26 DIAGNOSIS — D352 Benign neoplasm of pituitary gland: Secondary | ICD-10-CM | POA: Diagnosis not present

## 2015-01-26 LAB — POCT URINALYSIS DIP (MANUAL ENTRY)
Bilirubin, UA: NEGATIVE
GLUCOSE UA: NEGATIVE
Ketones, POC UA: NEGATIVE
Nitrite, UA: NEGATIVE
Protein Ur, POC: NEGATIVE
RBC UA: NEGATIVE
Spec Grav, UA: 1.015
Urobilinogen, UA: 0.2
pH, UA: 7

## 2015-01-26 NOTE — Progress Notes (Signed)
Subjective:    Patient ID: Catherine Olsen, female    DOB: 04-Jul-1970, 44 y.o.   MRN: [id number]  01/26/2015  Annual Exam and depression screening   HPI This 44 y.o. female presents for Annual Wellness Examination.  Last physical: not sure Pap smear: "a few years ago" Mammogram:  07-14-14 [facility]: refuses Influenza: refuses   LMP two months ago.  Not sure if going through early menopause.  Hard for pt to get to [facility].  Signed ROI for gynecology and endocrinology visits.  Difficult for family members to drive patient to [facility].  Cannot see gynecologist locally because needs specialist.  Local gynecologist recommended [place], [place], or [place].  Would like Dr. Mariea Clonts to read results.    Pituitary microadenoma: will need repeat labs and MRI in 03/2015.  Cannot drive to [facility] for appointment; hoping to undergo MRI and labs in [place] and have results sent to endocrinology.  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   Worried about portal hypertension; brother with alpha trypsin deficiency.    Vegan; exercising.  Appetite does not want that much food.    R large ovarian cyst:  Had severe pain on R; presented to ED: physician did not elect to perform pelvic US.    Bipolar disorder:  [facility] at [facility].  Hospitalization x 9 in the past; last hospitalization in 2014.  Suffers with paranoia with mania.   Review of Systems  Constitutional: Positive for diaphoresis. Negative for fever, chills, activity change, appetite change, fatigue and unexpected weight change.  HENT: Negative for congestion, dental problem, drooling, ear discharge, ear pain, facial swelling, hearing loss, mouth sores, nosebleeds, postnasal drip, rhinorrhea, sinus pressure, sneezing, sore throat, tinnitus, trouble swallowing and voice change.   Eyes: Negative for photophobia, pain, discharge, redness, itching and visual disturbance.    Respiratory: Negative for apnea, cough, choking, chest tightness, shortness of breath, wheezing and stridor.   Cardiovascular: Negative for chest pain, palpitations and leg swelling.  Gastrointestinal: Negative for nausea, vomiting, abdominal pain, diarrhea, constipation, blood in stool, abdominal distention, anal bleeding and rectal pain.  Endocrine: Negative for cold intolerance, heat intolerance, polydipsia, polyphagia and polyuria.  Genitourinary: Positive for menstrual problem. Negative for dysuria, urgency, frequency, hematuria, flank pain, decreased urine volume, vaginal bleeding, vaginal discharge, enuresis, difficulty urinating, genital sores, vaginal pain, pelvic pain and dyspareunia.  Musculoskeletal: Positive for neck pain. Negative for myalgias, back pain, joint swelling, arthralgias, gait problem and neck stiffness.  Skin: Negative for color change, pallor, rash and wound.  Allergic/Immunologic: Negative for environmental allergies, food allergies and immunocompromised state.  Neurological: Negative for dizziness, tremors, seizures, syncope, facial asymmetry, speech difficulty, weakness, light-headedness, numbness and headaches.  Hematological: Negative for adenopathy. Does not bruise/bleed easily.  Psychiatric/Behavioral: Positive for dysphoric mood. Negative for suicidal ideas, hallucinations, behavioral problems, confusion, sleep disturbance, self-injury, decreased concentration and agitation. The patient is nervous/anxious. The patient is not hyperactive.     Past Medical History  Diagnosis Date  . Asthma   . Anxiety   . Mental disorder   . Bipolar 1 disorder ([place])   . Endometriosis   . PONV (postoperative nausea and vomiting)   . Termination of pregnancy     x 2 at age 62 and 44 yrs old  . Depression   . Headache(784.0)     otc med prn  . Arthritis     hands, lower back, knees  . Fibromyalgia   .  Scoliosis   . Scoliosis   . Allergy    Past Surgical History   Procedure Laterality Date  . Nose surgery      rhinoplasty at age 21 yrs  . Wisdom tooth extraction    . Facial cosmetic surgery      right cheek  . Laparoscopy Right 11/11/2013    Procedure: LAPAROSCOPY OPERATIVE with  Drainage of  RIGHT Ovarian ENDOMETRIOMA;  Surgeon: Elveria Royals, MD;  Location: [facility];  Service: Gynecology;  Laterality: Right;  . Dilation and curettage of uterus    . Breast surgery     Allergies  Allergen Reactions  . Sulfamethoxazole Anaphylaxis  . Oxycodone Other (See Comments)    Severe blindness?  . Penicillins Hives  . Other Anxiety    steroids  . Percocet [Oxycodone-Acetaminophen] Palpitations  . Sulfa Antibiotics Hives  . Sulfites Other (See Comments)    unknown    Social History   Social History  . Marital Status: Divorced    Spouse Name: N/A  . Number of Children: 0  . Years of Education: N/A   Occupational History  . disability     mental illness   Social History Main Topics  . Smoking status: Current Every Day Smoker -- 29 years    Types: Cigarettes, E-cigarettes  . Smokeless tobacco: Never Used  . Alcohol Use: No     Comment: "rare"  . 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 Sexual Activity: No     Comment: abortion at 7 and 43yrs. of age    Other Topics Concern  . Not on file   Social History Narrative   Marital status: divorced; not dating      Children: none      Lives: alone with mom      Employment: disability for mental illness in 1997.  Hospitalizations x 9 in past.      Tobacco: 1 ppd x since age 66.      Alcohol: socially; beers.      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      Family History  Problem Relation Age of Onset  . Diabetes Mother   . Hypertension Mother   . Stroke Mother 83    CVA  . Mental illness Mother     no diagnosis; personality disorder  . Hyperlipidemia Father   . Hypertension Father   . COPD Father   .  Alpha-1 antitrypsin deficiency Father   . Diabetes Maternal Grandmother   . Heart disease Maternal Grandmother   . Hyperlipidemia Maternal Grandmother   . Hypertension Maternal Grandmother   . Mental illness Maternal Grandmother   . Heart disease Maternal Grandfather   . Hyperlipidemia Maternal Grandfather   . Hypertension Maternal Grandfather   . Heart disease Paternal Grandfather   . Hyperlipidemia Paternal Grandfather   . Hypertension Paternal Grandfather   . Stroke Paternal Grandfather   . Alpha-1 antitrypsin deficiency Brother        Objective:    BP 108/69 mmHg  Pulse 81  Temp(Src) 98.4 F (36.9 C) (Oral)  Resp 16  Ht 5\' 7"  (1.702 m)  Wt 127 lb 12.8 oz (57.97 kg)  BMI 20.01 kg/m2 Physical Exam  Constitutional: She is oriented to person, place, and time. She appears well-developed and well-nourished. No distress.  HENT:  Head: Normocephalic and atraumatic.  Right Ear: External ear normal.  Left Ear: External ear normal.  Nose: Nose normal.  Mouth/Throat: Oropharynx is clear and moist.  Eyes: Conjunctivae and EOM are normal. Pupils are equal, round, and reactive to light.  Neck: Normal range of motion and full passive range of motion without pain. Neck supple. No JVD present. Carotid bruit is not present. No thyromegaly present.  Cardiovascular: Normal rate, regular rhythm and normal heart sounds.  Exam reveals no gallop and no friction rub.   No murmur heard. Pulmonary/Chest: Effort normal and breath sounds normal. She has no wheezes. She has no rales.  Abdominal: Soft. Bowel sounds are normal. She exhibits no distension and no mass. There is no tenderness. There is no rebound and no guarding.  Genitourinary: Uterus normal. There is no rash, tenderness or lesion on the right labia. There is no rash, tenderness or lesion on the left labia. Cervix exhibits no motion tenderness, no discharge and no friability. Right adnexum displays no mass, no tenderness and no fullness.  Left adnexum displays no mass, no tenderness and no fullness. No erythema, tenderness or bleeding in the vagina. No foreign body around the vagina. No signs of injury around the vagina. No vaginal discharge found.  Musculoskeletal:       Right shoulder: Normal.       Left shoulder: Normal.       Cervical back: Normal.  Lymphadenopathy:    She has no cervical adenopathy.  Neurological: She is alert and oriented to person, place, and time. She has normal reflexes. No cranial nerve deficit. She exhibits normal muscle tone. Coordination normal.  Skin: Skin is warm and dry. No rash noted. She is not diaphoretic. No erythema. No pallor.  Psychiatric: She has a normal mood and affect. Her behavior is normal. Judgment and thought content normal.  Nursing note and vitals reviewed.       Assessment & Plan:   1. Encounter for Medicare annual wellness exam   2. Encounter for screening for cervical cancer    3. Fibrocystic breast disease, unspecified laterality   4. Endometrioma of ovary   5. 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 Pituitary microadenoma ([place])     1.  Annual Wellness Exam subsequent:  Anticipatory guidance --- exercise, weight maintenance, smoking cessation.  Pap smear obtained; mammogram UTD.  Refuses immunizations.  Independent with ADLs.  Low fall risk.  Being treated for bipoloar disorder by psychiatry. 2.  Cervical cancer screening: pap smear obtained.  Recent irregular menses without recent sexual activity; mother with early menopause. 3.  Fibrocystic breast disease: stable; followed by Dr. Excell Seltzer; mammogram UTD. 4.  Endometrioma of R ovary: stable; benign exam today; pain has decreased recently; pt does not desire surgery unless enlarges or suffers with moderate to severe pain.  5.  Mixed bipolar disorder: quite paranoid and anxious during visit today; managed by psychiatry. 6.  Pituitary microadenoma: stable; followed by endocrinology at [facility]; will warrant repeat MRI and  labs in 03/2015; will review records.   Orders Placed This Encounter  Procedures  . POCT urinalysis dipstick   Meds ordered this encounter  Medications  . HYDROcodone-acetaminophen (NORCO/VICODIN) 5-325 MG tablet    Sig: Take 1 tablet by mouth every 6 (six) hours as needed for moderate pain.    Return in about 4 weeks (around 02/23/2015) for recheck.    Kayson Tasker Elayne Guerin, M.D. [facility] [address] [place], [place]  [address] [phone] phone [phone] fax

## 2015-01-26 NOTE — Patient Instructions (Signed)
Keeping You Healthy  Get These Tests 1. Blood Pressure- Have your blood pressure checked once a year by your health care provider.  Normal blood pressure is 120/80. 2. Weight- Have your body mass index (BMI) calculated to screen for obesity.  BMI is measure of body fat based on height and weight.  You can also calculate your own BMI at www.nhlbisupport.com/bmi/. 3. Cholesterol- Have your cholesterol checked every 5 years starting at age 43 then yearly starting at age 45. 4. Chlamydia, HIV, and other sexually transmitted diseases- Get screened every year until age 25, then within three months of each new sexual provider. 5. Pap Test - Every 1-5 years; discuss with your health care provider. 6. Mammogram- Every 1-2 years starting at age 40--50  Take these medicines  Calcium with Vitamin D-Your body needs 1200 mg of Calcium each day and 800-1000 IU of Vitamin D daily.  Your body can only absorb 500 mg of Calcium at a time so Calcium must be taken in 2 or 3 divided doses throughout the day.  Multivitamin with folic acid- Once daily if it is possible for you to become pregnant.  Get these Immunizations  Gardasil-Series of three doses; prevents HPV related illness such as genital warts and cervical cancer.  Menactra-Single dose; prevents meningitis.  Tetanus shot- Every 10 years.  Flu shot-Every year.  Take these steps 1. Do not smoke-Your healthcare provider can help you quit.  For tips on how to quit go to www.smokefree.gov or call 1-800 QUITNOW. 2. Be physically active- Exercise 5 days a week for at least 30 minutes.  If you are not already physically active, start slow and gradually work up to 30 minutes of moderate physical activity.  Examples of moderate activity include walking briskly, dancing, swimming, bicycling, etc. 3. Breast Cancer- A self breast exam every month is important for early detection of breast cancer.  For more information and instruction on self breast exams, ask your  healthcare provider or www.womenshealth.gov/faq/breast-self-exam.cfm. 4. Eat a healthy diet- Eat a variety of healthy foods such as fruits, vegetables, whole grains, low fat milk, low fat cheeses, yogurt, lean meats, poultry and fish, beans, nuts, tofu, etc.  For more information go to www. Thenutritionsource.org 5. Drink alcohol in moderation- Limit alcohol intake to one drink or less per day. Never drink and drive. 6. Depression- Your emotional health is as important as your physical health.  If you're feeling down or losing interest in things you normally enjoy please talk to your healthcare provider about being screened for depression. 7. Dental visit- Brush and floss your teeth twice daily; visit your dentist twice a year. 8. Eye doctor- Get an eye exam at least every 2 years. 9. Helmet use- Always wear a helmet when riding a bicycle, motorcycle, rollerblading or skateboarding. 10. Safe sex- If you may be exposed to sexually transmitted infections, use a condom. 11. Seat belts- Seat belts can save your live; always wear one. 12. Smoke/Carbon Monoxide detectors- These detectors need to be installed on the appropriate level of your home. Replace batteries at least once a year. 13. Skin cancer- When out in the sun please cover up and use sunscreen 15 SPF or higher. 14. Violence- If anyone is threatening or hurting you, please tell your healthcare provider.        

## 2015-01-29 ENCOUNTER — Other Ambulatory Visit: Payer: Self-pay | Admitting: General Surgery

## 2015-01-29 DIAGNOSIS — N6310 Unspecified lump in the right breast, unspecified quadrant: Secondary | ICD-10-CM

## 2015-01-30 LAB — PAP IG, CT-NG, RFX HPV ASCU
Chlamydia Probe Amp: NEGATIVE
GC Probe Amp: NEGATIVE

## 2015-02-03 ENCOUNTER — Encounter: Payer: Self-pay | Admitting: Family Medicine

## 2015-02-06 ENCOUNTER — Other Ambulatory Visit: Payer: Self-pay | Admitting: General Surgery

## 2015-02-06 ENCOUNTER — Encounter: Payer: Self-pay | Admitting: Family Medicine

## 2015-02-06 ENCOUNTER — Ambulatory Visit
Admission: RE | Admit: 2015-02-06 | Discharge: 2015-02-06 | Disposition: A | Discharge status: Home / Self Care | Payer: Medicare Other | Source: Ambulatory Visit | Attending: General Surgery | Admitting: General Surgery

## 2015-02-06 DIAGNOSIS — N6011 Diffuse cystic mastopathy of right breast: Secondary | ICD-10-CM | POA: Diagnosis not present

## 2015-02-06 DIAGNOSIS — N6310 Unspecified lump in the right breast, unspecified quadrant: Secondary | ICD-10-CM

## 2015-02-06 DIAGNOSIS — N63 Unspecified lump in unspecified breast: Secondary | ICD-10-CM

## 2015-02-08 ENCOUNTER — Ambulatory Visit (INDEPENDENT_AMBULATORY_CARE_PROVIDER_SITE_OTHER): Payer: Medicare Other | Admitting: Family Medicine

## 2015-02-08 ENCOUNTER — Other Ambulatory Visit: Payer: Self-pay | Admitting: Family Medicine

## 2015-02-08 VITALS — BP 114/80 | HR 75 | Temp 97.7°F | Resp 17 | Ht 67.0 in | Wt 129.4 lb

## 2015-02-08 DIAGNOSIS — Z9114 Patient's other noncompliance with medication regimen: Secondary | ICD-10-CM | POA: Diagnosis not present

## 2015-02-08 DIAGNOSIS — D352 Benign neoplasm of pituitary gland: Secondary | ICD-10-CM | POA: Diagnosis not present

## 2015-02-08 DIAGNOSIS — R51 Headache: Secondary | ICD-10-CM

## 2015-02-08 DIAGNOSIS — R112 Nausea with vomiting, unspecified: Secondary | ICD-10-CM | POA: Diagnosis not present

## 2015-02-08 DIAGNOSIS — G4489 Other headache syndrome: Secondary | ICD-10-CM | POA: Diagnosis not present

## 2015-02-08 DIAGNOSIS — R220 Localized swelling, mass and lump, head: Secondary | ICD-10-CM

## 2015-02-08 DIAGNOSIS — Z91148 Patient's other noncompliance with medication regimen for other reason: Secondary | ICD-10-CM

## 2015-02-08 DIAGNOSIS — R519 Headache, unspecified: Secondary | ICD-10-CM

## 2015-02-08 LAB — POCT CBC
Granulocyte percent: 73.8 %G (ref 37–80)
HCT, POC: 43.3 % (ref 37.7–47.9)
Hemoglobin: 15 g/dL (ref 12.2–16.2)
Lymph, poc: 1.4 (ref 0.6–3.4)
MCH, POC: 32.4 pg — AB (ref 27–31.2)
MCHC: 34.7 g/dL (ref 31.8–35.4)
MCV: 93.4 fL (ref 80–97)
MID (cbc): 0.6 (ref 0–0.9)
MPV: 6.6 fL (ref 0–99.8)
POC Granulocyte: 5.7 (ref 2–6.9)
POC LYMPH PERCENT: 18.7 %L (ref 10–50)
POC MID %: 7.5 % (ref 0–12)
Platelet Count, POC: 205 10*3/uL (ref 142–424)
RBC: 4.64 M/uL (ref 4.04–5.48)
RDW, POC: 13.1 %
WBC: 7.7 10*3/uL (ref 4.6–10.2)

## 2015-02-08 LAB — COMPLETE METABOLIC PANEL WITH GFR
Albumin: 4.3 g/dL (ref 3.6–5.1)
Alkaline Phosphatase: 48 U/L (ref 33–115)
CO2: 24 mmol/L (ref 20–31)
Calcium: 8.9 mg/dL (ref 8.6–10.2)
GFR, Est African American: >89 mL/min (ref >=60)
GFR, Est Non African American: >89 mL/min (ref >=60)
Potassium: 3.7 mmol/L (ref 3.5–5.3)

## 2015-02-08 LAB — COMPLETE METABOLIC PANEL WITHOUT GFR
ALT: 15 U/L (ref 6–29)
AST: 15 U/L (ref 10–30)
BUN: 13 mg/dL (ref 7–25)
Chloride: 104 mmol/L (ref 98–110)
Creat: 0.72 mg/dL (ref 0.50–1.10)
Glucose, Bld: 97 mg/dL (ref 65–99)
Sodium: 137 mmol/L (ref 135–146)
Total Bilirubin: 0.6 mg/dL (ref 0.2–1.2)
Total Protein: 6.5 g/dL (ref 6.1–8.1)

## 2015-02-08 LAB — CORTISOL: Cortisol, Plasma: 29.7 ug/dL

## 2015-02-08 LAB — T4, FREE: Free T4: 1.2 ng/dL (ref 0.80–1.80)

## 2015-02-08 MED ORDER — IBUPROFEN 200 MG PO TABS: 800.0000 mg | ORAL_TABLET | Freq: Once | ORAL | Status: DC

## 2015-02-08 MED ORDER — IBUPROFEN 800 MG PO TABS: 800.0000 mg | ORAL_TABLET | Freq: Three times a day (TID) | ORAL | Status: DC | PRN

## 2015-02-08 NOTE — Progress Notes (Signed)
Chief Complaint:  Chief Complaint  Patient presents with  . Facial Swelling    Around eyes,   . Eye Pain    "Behind eyes"  . Edema    Hands    HPI: Catherine Olsen is a 44 y.o. female who reports to [facility] today complaining of pain in eye and forehead, she feels her forehead is bulging out, she states she needs to get labs for her pituitary adenoma, she has an appt with her endocrinologist at [facility] in Dec but that is too far out, she called Dr Braulio Conte , her endocrinologist , and spoke with the nurse and they would like her to get blood work per the patient. But when I Called them her appt is on Dec 8 and the nurse told me she can have those labs done in the office at the time of her appt.   She has had migraines and eye pain behind her eyes, typical for her migraine sxs associated with nausea nd some vomiting, she ahs swelling in her hands , she has frontal swelling, she has been  thrpwing up coffee grinds, she is not sure if she put in a cigarette but in her coffee. She  Denies any confusion, numbness or tingling. She is not taking any of her bipolar meds, takes her celebrex prn and has not had any in several days   Past Medical History  Diagnosis Date  . Asthma   . Anxiety   . Mental disorder   . Bipolar 1 disorder ([place])   . Endometriosis   . PONV (postoperative nausea and vomiting)   . Termination of pregnancy     x 2 at age 69 and 44 yrs old  . Depression   . Headache(784.0)     otc med prn  . Arthritis     hands, lower back, knees  . Fibromyalgia   . Scoliosis   . Scoliosis   . Allergy    Past Surgical History  Procedure Laterality Date  . Nose surgery      rhinoplasty at age 33 yrs  . Wisdom tooth extraction    . Facial cosmetic surgery      right cheek  . Laparoscopy Right 11/11/2013    Procedure: LAPAROSCOPY OPERATIVE with  Drainage of  RIGHT Ovarian ENDOMETRIOMA;  Surgeon: Elveria Royals, MD;  Location: [facility];  Service: Gynecology;  Laterality: Right;  .  Dilation and curettage of uterus    . Breast surgery     Social History   Social History  . Marital Status: Divorced    Spouse Name: N/A  . Number of Children: 0  . Years of Education: N/A   Occupational History  . disability     mental illness   Social History Main Topics  . Smoking status: Current Every Day Smoker -- 29 years    Types: Cigarettes, E-cigarettes  . Smokeless tobacco: Never Used  . Alcohol Use: No     Comment: "rare"  . 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 Sexual Activity: No     Comment: abortion at 36 and 40yrs. of age    Other Topics Concern  . None   Social History Narrative   Marital status: divorced; not dating      Children: none      Lives: alone with mom      Employment: disability for  mental illness in 1997.  Hospitalizations x 9 in past.      Tobacco: 1 ppd x since age 39.      Alcohol: socially; beers.      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      Family History  Problem Relation Age of Onset  . Diabetes Mother   . Hypertension Mother   . Stroke Mother 21    CVA  . Mental illness Mother     no diagnosis; personality disorder  . Hyperlipidemia Father   . Hypertension Father   . COPD Father   . Alpha-1 antitrypsin deficiency Father   . Diabetes Maternal Grandmother   . Heart disease Maternal Grandmother   . Hyperlipidemia Maternal Grandmother   . Hypertension Maternal Grandmother   . Mental illness Maternal Grandmother   . Heart disease Maternal Grandfather   . Hyperlipidemia Maternal Grandfather   . Hypertension Maternal Grandfather   . Heart disease Paternal Grandfather   . Hyperlipidemia Paternal Grandfather   . Hypertension Paternal Grandfather   . Stroke Paternal Grandfather   . Alpha-1 antitrypsin deficiency Brother    Allergies  Allergen Reactions  . Sulfamethoxazole Anaphylaxis  . Oxycodone Other (See Comments)    Severe blindness?    . Penicillins Hives  . Other Anxiety    steroids  . Percocet [Oxycodone-Acetaminophen] Palpitations  . Sulfa Antibiotics Hives  . Sulfites Other (See Comments)    unknown   Prior to Admission medications   Medication Sig Start Date End Date Taking? Authorizing Provider  celecoxib (CELEBREX) 200 MG capsule Take 1 capsule (200 mg total) by mouth 2 (two) times daily. 10/12/14  Yes Wardell Honour, MD  clonazePAM (KLONOPIN) 2 MG tablet Take 4 mg by mouth at bedtime as needed. For sleep 06/28/14  Yes Historical Provider, MD  diphenhydrAMINE (BENADRYL) 25 MG tablet Take 50-100 mg by mouth every 8 (eight) hours as needed for itching, allergies or sleep.    Yes Historical Provider, MD  EPINEPHrine (EPIPEN 2-PAK) 0.3 mg/0.3 mL IJ SOAJ injection Inject 0.3 mLs (0.3 mg total) into the muscle once. 09/20/14  Yes Wardell Honour, MD  Magnesium 250 MG TABS Take 250 mg by mouth daily.    Yes Historical Provider, MD  Multiple Vitamin (MULTIVITAMIN WITH MINERALS) TABS tablet Take 1 tablet by mouth daily. Vitamin protein shake   Yes Historical Provider, MD  acetaminophen (TYLENOL) 500 MG tablet Take 1 tablet (500 mg total) by mouth every 6 (six) hours as needed. Patient not taking: Reported on 02/08/2015 01/22/15   Jola Schmidt, MD  albuterol (PROVENTIL HFA;VENTOLIN HFA) 108 (90 BASE) MCG/ACT inhaler Inhale 2 puffs into the lungs every 6 (six) hours as needed for wheezing or shortness of breath (cough, shortness of breath or wheezing.). Patient not taking: Reported on 02/08/2015 09/20/14   Wardell Honour, MD  ARIPiprazole (ABILIFY) 5 MG tablet Take 5 mg by mouth daily. 07/04/14   Historical Provider, MD  HYDROcodone-acetaminophen (NORCO/VICODIN) 5-325 MG tablet Take 1 tablet by mouth every 6 (six) hours as needed for moderate pain.    Historical Provider, MD  nicotine (NICODERM CQ - DOSED IN MG/24 HOURS) 21 mg/24hr patch  06/18/14   Historical Provider, MD     ROS: The patient denies fevers, chills, night sweats,  unintentional weight loss, chest pain, palpitations, wheezing, dyspnea on exertion,  abdominal pain, dysuria, hematuria, melena, numbness, weakness, or tingling.  + light and noise sensitivity  All other systems have been reviewed  and were otherwise negative with the exception of those mentioned in the HPI and as above.    PHYSICAL EXAM: Filed Vitals:   02/08/15 1228  BP: 114/80  Pulse: 75  Temp: 97.7 F (36.5 C)  Resp: 17   Body mass index is 20.26 kg/(m^2).   General: Alert, no acute distress HEENT:  Normocephalic, atraumatic, oropharynx patent. EOMI, PERRLA. FUndo exam normal  + facial pain , minimal facial swellingbetween her eyebrows, in the furrows of her brows. Cardiovascular:  Regular rate and rhythm, no rubs murmurs or gallops.  No Carotid bruits, radial pulse intact. No pedal edema.  Respiratory: Clear to auscultation bilaterally.  No wheezes, rales, or rhonchi.  No cyanosis, no use of accessory musculature Abdominal: No organomegaly, abdomen is soft and non-tender, positive bowel sounds. No masses. Skin: No rashes. Neurologic: Facial musculature symmetric. CN 2-12 grossly intact Psychiatric: Patient acts appropriately throughout our interaction. Lymphatic: No cervical or submandibular lymphadenopathy Musculoskeletal: Gait intact. No edema, tenderness   LABS: Results for orders placed or performed in visit on 02/08/15  POCT CBC  Result Value Ref Range   WBC 7.7 4.6 - 10.2 K/uL   Lymph, poc 1.4 0.6 - 3.4   POC LYMPH PERCENT 18.7 10 - 50 %L   MID (cbc) 0.6 0 - 0.9   POC MID % 7.5 0 - 12 %M   POC Granulocyte 5.7 2 - 6.9   Granulocyte percent 73.8 37 - 80 %G   RBC 4.64 4.04 - 5.48 M/uL   Hemoglobin 15.0 12.2 - 16.2 g/dL   HCT, POC 43.3 37.7 - 47.9 %   MCV 93.4 80 - 97 fL   MCH, POC 32.4 (A) 27 - 31.2 pg   MCHC 34.7 31.8 - 35.4 g/dL   RDW, POC 13.1 %   Platelet Count, POC 205 142 - 424 K/uL   MPV 6.6 0 - 99.8 fL     EKG/XRAY:   Primary read interpreted by Dr.  Marin Comment at [facility].   ASSESSMENT/PLAN: Encounter Diagnoses  Name Primary?  . Facial swelling   . Other headache syndrome   . Facial pain   . Pituitary microadenoma ([place]) Yes  . Nausea and vomiting, intractability of vomiting not specified, unspecified vomiting type   . Left facial pain    I spoke with Dr Derald Macleod nurse appt is in Dec , but Zakhia wants blood work done now Will get blood work Anxiety, bipolar is the main issue,  She is not on meds because does not want any she states her psychiatrist knows that  She refuses any treatment for nausea or IVF for HA or toradol, she is not taking celebrex regular She is here primarily for blood work She was given ibuprofen 800 mg x 1  Gross sideeffects, risk and benefits, and alternatives of medications d/w patient. Patient is aware that all medications have potential sideeffects and we are unable to predict every sideeffect or drug-drug interaction that may occur.  Lexey Fletes DO  02/08/2015 2:19 PM

## 2015-02-09 ENCOUNTER — Other Ambulatory Visit: Payer: Self-pay | Admitting: Family Medicine

## 2015-02-09 ENCOUNTER — Encounter: Payer: Self-pay | Admitting: Family Medicine

## 2015-02-11 LAB — TSH: TSH: 1.034 u[IU]/mL (ref 0.350–4.500)

## 2015-02-12 LAB — INSULIN-LIKE GROWTH FACTOR
IGF-I, LC/MS: 166 ng/mL (ref 52–328)
Z-Score (Female): 0.3 {STDV} (ref ?–2.0)

## 2015-02-13 ENCOUNTER — Encounter: Payer: Self-pay | Admitting: Family Medicine

## 2015-02-13 DIAGNOSIS — F4312 Post-traumatic stress disorder, chronic: Secondary | ICD-10-CM | POA: Diagnosis not present

## 2015-02-13 DIAGNOSIS — F3132 Bipolar disorder, current episode depressed, moderate: Secondary | ICD-10-CM | POA: Diagnosis not present

## 2015-02-14 ENCOUNTER — Encounter: Payer: Self-pay | Admitting: Family Medicine

## 2015-02-14 ENCOUNTER — Ambulatory Visit (INDEPENDENT_AMBULATORY_CARE_PROVIDER_SITE_OTHER): Payer: Medicare Other | Admitting: Family Medicine

## 2015-02-14 VITALS — BP 124/80 | HR 79 | Temp 97.6°F | Resp 18 | Ht 68.0 in | Wt 133.2 lb

## 2015-02-14 DIAGNOSIS — N970 Female infertility associated with anovulation: Secondary | ICD-10-CM

## 2015-02-14 DIAGNOSIS — R220 Localized swelling, mass and lump, head: Secondary | ICD-10-CM | POA: Diagnosis not present

## 2015-02-14 DIAGNOSIS — D352 Benign neoplasm of pituitary gland: Secondary | ICD-10-CM

## 2015-02-14 DIAGNOSIS — R519 Headache, unspecified: Secondary | ICD-10-CM

## 2015-02-14 DIAGNOSIS — N80129 Deep endometriosis of ovary, unspecified ovary: Secondary | ICD-10-CM

## 2015-02-14 DIAGNOSIS — N801 Endometriosis of ovary: Secondary | ICD-10-CM | POA: Diagnosis not present

## 2015-02-14 DIAGNOSIS — F3160 Bipolar disorder, current episode mixed, unspecified: Secondary | ICD-10-CM | POA: Diagnosis not present

## 2015-02-14 DIAGNOSIS — R51 Headache: Secondary | ICD-10-CM | POA: Diagnosis not present

## 2015-02-14 DIAGNOSIS — Z91148 Patient's other noncompliance with medication regimen for other reason: Secondary | ICD-10-CM

## 2015-02-14 DIAGNOSIS — Z9114 Patient's other noncompliance with medication regimen: Secondary | ICD-10-CM

## 2015-02-14 NOTE — Patient Instructions (Signed)
You will be getting an MRI of the the head with and without contrast.

## 2015-02-14 NOTE — Progress Notes (Signed)
Chief Complaint:  Chief Complaint  Patient presents with  . Follow-up    HPI: Catherine Olsen is a 44 y.o. female who reports to [facility] today complaining of progressive facial pain, and swelling of her face especially along the ridge of her forehead and also hand swelling. She is very anxious. She wants blood work done to rule out any pituitary tumor, cushings, PCOS etc. We had talked about PCOS since her hands are large and she does have very masculine features but she states she looks like her dad. She has been without a period for 3 months, she has a hx of endometrioma.  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 Catherine Olsen is very anxious and worried about acromegaly. She is being follwoed by endocrinology at [facility], Dr Braulio Conte. We did blood work recently and everything came back normal except cortisol but it was drawn midday and her anxiety level has been very high. She also has seen her psychiatrist, she is bipolar, she denies mania, she has been on abilify, seroquel and lithium in the past, she did not like any of them and will not take any of them, feels some of her health issues are from it. Bipolar runs in her family. She is not suicidal or homicidal or having any hallucinations, she states her paranoia is very well managed right now.   Next appt with Braulio Conte is Dec 8, no earlier appt available. I havecalled their office to get notes/labs faxed over to me but have not received them.   Wt Readings from Last 3 Encounters:  02/15/15 131 lb (59.421 kg)  02/14/15 133 lb 3.2 oz (60.419 kg)  02/08/15 129 lb 6.4 oz (58.695 kg)     Past Medical History  Diagnosis Date  . Asthma   . Anxiety   . Mental disorder   . Bipolar 1 disorder ([place])   . Endometriosis   . PONV (postoperative nausea and vomiting)   . Termination of pregnancy     x 2 at age 20 and 44 yrs old  . Depression   . Headache(784.0)     otc med prn  . Arthritis     hands, lower back, knees  . Fibromyalgia   .  Scoliosis   . Scoliosis   . Allergy    Past Surgical History  Procedure Laterality Date  . Nose surgery      rhinoplasty at age 77 yrs  . Wisdom tooth extraction    . Facial cosmetic surgery      right cheek  . Laparoscopy Right 11/11/2013    Procedure: LAPAROSCOPY OPERATIVE with  Drainage of  RIGHT Ovarian ENDOMETRIOMA;  Surgeon: Elveria Royals, MD;  Location: [facility];  Service: Gynecology;  Laterality: Right;  . Dilation and curettage of uterus    . Breast surgery     Social History   Social History  . Marital Status: Divorced    Spouse Name: N/A  . Number of Children: 0  . Years of Education: N/A   Occupational History  . disability     mental illness   Social History Main Topics  . Smoking status: Current Every Day Smoker -- 29 years    Types: Cigarettes, E-cigarettes  . Smokeless tobacco: Never Used  . Alcohol Use: No     Comment: "rare"  . 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 Sexual Activity: No  Comment: abortion at 36 and 79yrs. of age    Other Topics Concern  . None   Social History Narrative   Marital status: divorced; not dating      Children: none      Lives: alone with mom      Employment: disability for mental illness in 1997.  Hospitalizations x 9 in past.      Tobacco: 1 ppd x since age 19.      Alcohol: socially; beers.      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      Family History  Problem Relation Age of Onset  . Diabetes Mother   . Hypertension Mother   . Stroke Mother 30    CVA  . Mental illness Mother     no diagnosis; personality disorder  . Hyperlipidemia Father   . Hypertension Father   . COPD Father   . Alpha-1 antitrypsin deficiency Father   . Diabetes Maternal Grandmother   . Heart disease Maternal Grandmother   . Hyperlipidemia Maternal Grandmother   . Hypertension Maternal Grandmother   . Mental illness Maternal Grandmother   . Heart  disease Maternal Grandfather   . Hyperlipidemia Maternal Grandfather   . Hypertension Maternal Grandfather   . Heart disease Paternal Grandfather   . Hyperlipidemia Paternal Grandfather   . Hypertension Paternal Grandfather   . Stroke Paternal Grandfather   . Alpha-1 antitrypsin deficiency Brother    Allergies  Allergen Reactions  . Sulfamethoxazole Anaphylaxis  . Oxycodone Other (See Comments)    Severe blindness?  . Penicillins Hives  . Other Anxiety    steroids  . Percocet [Oxycodone-Acetaminophen] Palpitations  . Sulfa Antibiotics Hives  . Sulfites Other (See Comments)    unknown   Prior to Admission medications   Medication Sig Start Date End Date Taking? Authorizing Provider  albuterol (PROVENTIL HFA;VENTOLIN HFA) 108 (90 BASE) MCG/ACT inhaler Inhale 2 puffs into the lungs every 6 (six) hours as needed for wheezing or shortness of breath (cough, shortness of breath or wheezing.). 09/20/14  Yes Wardell Honour, MD  celecoxib (CELEBREX) 200 MG capsule Take 1 capsule (200 mg total) by mouth 2 (two) times daily. 10/12/14  Yes Wardell Honour, MD  clonazePAM (KLONOPIN) 2 MG tablet Take 4 mg by mouth at bedtime as needed. For sleep 06/28/14  Yes Historical Provider, MD  diphenhydrAMINE (BENADRYL) 25 MG tablet Take 50-100 mg by mouth every 8 (eight) hours as needed for itching, allergies or sleep.    Yes Historical Provider, MD  EPINEPHrine (EPIPEN 2-PAK) 0.3 mg/0.3 mL IJ SOAJ injection Inject 0.3 mLs (0.3 mg total) into the muscle once. 09/20/14  Yes Wardell Honour, MD  HYDROcodone-acetaminophen (NORCO/VICODIN) 5-325 MG tablet Take 1 tablet by mouth every 6 (six) hours as needed for moderate pain.   Yes Historical Provider, MD  ibuprofen (ADVIL,MOTRIN) 800 MG tablet Take 1 tablet (800 mg total) by mouth every 8 (eight) hours as needed. Take with FOOD, no other NSAIDS ie Celebrex 02/08/15  Yes Season Astacio P Naimah Yingst, DO  Magnesium 250 MG TABS Take 250 mg by mouth daily.    Yes Historical Provider, MD    Multiple Vitamin (MULTIVITAMIN WITH MINERALS) TABS tablet Take 1 tablet by mouth daily. Vitamin protein shake   Yes Historical Provider, MD  nicotine (NICODERM CQ - DOSED IN MG/24 HOURS) 21 mg/24hr patch  06/18/14  Yes Historical Provider, MD  ARIPiprazole (ABILIFY) 5 MG tablet Take 5 mg by mouth daily. 07/04/14  Historical Provider, MD     ROS: The patient denies fevers, chills, night sweats, unintentional weight loss, chest pain, palpitations, wheezing, dyspnea on exertion, nausea, vomiting, abdominal pain, dysuria, hematuria, melena, numbness, weakness, or tingling.   All other systems have been reviewed and were otherwise negative with the exception of those mentioned in the HPI and as above.    PHYSICAL EXAM: Filed Vitals:   02/14/15 1634  BP: 124/80  Pulse: 79  Temp: 97.6 F (36.4 C)  Resp: 18   Body mass index is 20.26 kg/(m^2).   General: Alert, no acute distress HEENT:  Normocephalic, atraumatic, oropharynx patent. EOMI, PERRLA Cardiovascular:  Regular rate and rhythm, no rubs murmurs or gallops.  No Carotid bruits, radial pulse intact. No pedal edema.  Respiratory: Clear to auscultation bilaterally.  No wheezes, rales, or rhonchi.  No cyanosis, no use of accessory musculature Abdominal: No organomegaly, abdomen is soft and non-tender, positive bowel sounds. No masses. Skin: No rashes. Neurologic: Facial musculature symmetric. Psychiatric: Patient acts appropriately throughout our interaction. Lymphatic: No cervical or submandibular lymphadenopathy Musculoskeletal: Gait intact. No edema, tenderness   LABS: Results for orders placed or performed in visit on 02/08/15  COMPLETE METABOLIC PANEL WITH GFR  Result Value Ref Range   Sodium 137 135 - 146 mmol/L   Potassium 3.7 3.5 - 5.3 mmol/L   Chloride 104 98 - 110 mmol/L   CO2 24 20 - 31 mmol/L   Glucose, Bld 97 65 - 99 mg/dL   BUN 13 7 - 25 mg/dL   Creat 0.72 0.50 - 1.10 mg/dL   Total Bilirubin 0.6 0.2 - 1.2 mg/dL    Alkaline Phosphatase 48 33 - 115 U/L   AST 15 10 - 30 U/L   ALT 15 6 - 29 U/L   Total Protein 6.5 6.1 - 8.1 g/dL   Albumin 4.3 3.6 - 5.1 g/dL   Calcium 8.9 8.6 - 10.2 mg/dL   GFR, Est African American >89 >=60 mL/min   GFR, Est Non African American >89 >=60 mL/min  Cortisol  Result Value Ref Range   Cortisol, Plasma 29.7 ug/dL  T4, Free  Result Value Ref Range   Free T4 1.20 0.80 - 1.80 ng/dL  Insulin-like growth factor  Result Value Ref Range   IGF-I, LC/MS 166 52 - 328 ng/mL   Z-Score (Female) 0.3 -2.0-+2.0 SD  POCT CBC  Result Value Ref Range   WBC 7.7 4.6 - 10.2 K/uL   Lymph, poc 1.4 0.6 - 3.4   POC LYMPH PERCENT 18.7 10 - 50 %L   MID (cbc) 0.6 0 - 0.9   POC MID % 7.5 0 - 12 %M   POC Granulocyte 5.7 2 - 6.9   Granulocyte percent 73.8 37 - 80 %G   RBC 4.64 4.04 - 5.48 M/uL   Hemoglobin 15.0 12.2 - 16.2 g/dL   HCT, POC 43.3 37.7 - 47.9 %   MCV 93.4 80 - 97 fL   MCH, POC 32.4 (A) 27 - 31.2 pg   MCHC 34.7 31.8 - 35.4 g/dL   RDW, POC 13.1 %   Platelet Count, POC 205 142 - 424 K/uL   MPV 6.6 0 - 99.8 fL     EKG/XRAY:   Primary read interpreted by Dr. Marin Comment at [facility].   ASSESSMENT/PLAN: Encounter Diagnoses  Name Primary?  . Facial swelling   . Facial pain   . 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 Anovulatory   . Noncompliance with medications   . 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 Endometrioma of ovary    Will await for labs , consider MRI  FU prn     Gross sideeffects, risk and benefits, and alternatives of medications d/w patient. Patient is aware that all medications have potential sideeffects and we are unable to predict every sideeffect or drug-drug interaction that may occur.  Shayla Heming DO  02/17/2015 10:23 AM

## 2015-02-15 ENCOUNTER — Ambulatory Visit (INDEPENDENT_AMBULATORY_CARE_PROVIDER_SITE_OTHER): Payer: Medicare Other | Admitting: Family Medicine

## 2015-02-15 VITALS — BP 110/86 | HR 88 | Temp 98.6°F | Resp 18 | Ht 66.0 in | Wt 131.0 lb

## 2015-02-15 DIAGNOSIS — R51 Headache: Secondary | ICD-10-CM | POA: Diagnosis not present

## 2015-02-15 DIAGNOSIS — D352 Benign neoplasm of pituitary gland: Secondary | ICD-10-CM

## 2015-02-15 DIAGNOSIS — F3160 Bipolar disorder, current episode mixed, unspecified: Secondary | ICD-10-CM

## 2015-02-15 DIAGNOSIS — Z9114 Patient's other noncompliance with medication regimen: Secondary | ICD-10-CM

## 2015-02-15 DIAGNOSIS — R220 Localized swelling, mass and lump, head: Secondary | ICD-10-CM

## 2015-02-15 DIAGNOSIS — Z91148 Patient's other noncompliance with medication regimen for other reason: Secondary | ICD-10-CM

## 2015-02-15 DIAGNOSIS — R519 Headache, unspecified: Secondary | ICD-10-CM

## 2015-02-16 ENCOUNTER — Ambulatory Visit (HOSPITAL_COMMUNITY)
Admission: RE | Admit: 2015-02-16 | Discharge: 2015-02-16 | Disposition: A | Discharge status: Home / Self Care | Payer: Medicare Other | Source: Ambulatory Visit | Attending: Family Medicine | Admitting: Family Medicine

## 2015-02-16 ENCOUNTER — Encounter: Payer: Self-pay | Admitting: Family Medicine

## 2015-02-16 DIAGNOSIS — R51 Headache: Secondary | ICD-10-CM | POA: Insufficient documentation

## 2015-02-16 DIAGNOSIS — D352 Benign neoplasm of pituitary gland: Secondary | ICD-10-CM | POA: Insufficient documentation

## 2015-02-16 DIAGNOSIS — D367 Benign neoplasm of other specified sites: Secondary | ICD-10-CM | POA: Diagnosis present

## 2015-02-16 DIAGNOSIS — R519 Headache, unspecified: Secondary | ICD-10-CM

## 2015-02-16 DIAGNOSIS — R220 Localized swelling, mass and lump, head: Secondary | ICD-10-CM | POA: Diagnosis not present

## 2015-02-16 MED ORDER — GADOBENATE DIMEGLUMINE 529 MG/ML IV SOLN: 10.0000 mL | Freq: Once | INTRAVENOUS | Status: DC | PRN

## 2015-02-16 NOTE — Progress Notes (Signed)
Chief Complaint:  Chief Complaint  Patient presents with  . Follow-up    HPI: Catherine Olsen is a 44 y.o. female who reports to [facility] today complaining of here for similar complaints, she feels she needs to have more testing done to make sure she is not having acromegaly. She is very anxious. No new sxs, she brings in pictures from 13 yearsa go to compare her forehead.   Last OV from 02/14/2015 Catherine Olsen is a 44 y.o. female who reports to [facility] today complaining of progressive facial pain, and swelling of her face especially along the ridge of her forehead and also hand swelling. She is very anxious. She wants blood work done to rush out any pituitary tumor, cushings, PCOS etc. We had talked about PCOS since her hands are large and she does have very masculine features but she states she looks like her dad. She has been without a period for 3 months, she has a hx of endometrioma.  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 Catherine Olsen is very anxious and worried about acromegaly. She is being follwoed by endocrinology at [facility], Dr Braulio Conte. We did blood work recently and everything came back normal except cortisol but it was drawn midday and her anxiety level has been very high. She also has seen her psychiatrist, she is bipolar, she denies mania, she has been on abilify, seroquel and lithium in the past, she did not like any of them and will not take any of them, feels some of her health issues are from it. Bipolar runs in her family. She is not suicidal or homicidal or having any hallucinations, she states her paranoia is very well managed right now.   Next appt with Mariel Aloe is Dec 8, no earlier appt available. I havecalled their office to get notes/labs faxed over to me but have not received them.   Wt Readings from Last 3 Encounters:  02/14/15 133 lb 3.2 oz (60.419 kg)  02/08/15 129 lb 6.4 oz (58.695 kg)  01/26/15 127 lb 12.8 oz (57.97 kg)       Past Medical History  Diagnosis Date    . Asthma   . Anxiety   . Mental disorder   . Bipolar 1 disorder ([place])   . Endometriosis   . PONV (postoperative nausea and vomiting)   . Termination of pregnancy     x 2 at age 47 and 44 yrs old  . Depression   . Headache(784.0)     otc med prn  . Arthritis     hands, lower back, knees  . Fibromyalgia   . Scoliosis   . Scoliosis   . Allergy    Past Surgical History  Procedure Laterality Date  . Nose surgery      rhinoplasty at age 39 yrs  . Wisdom tooth extraction    . Facial cosmetic surgery      right cheek  . Laparoscopy Right 11/11/2013    Procedure: LAPAROSCOPY OPERATIVE with  Drainage of  RIGHT Ovarian ENDOMETRIOMA;  Surgeon: Elveria Royals, MD;  Location: [facility];  Service: Gynecology;  Laterality: Right;  . Dilation and curettage of uterus    . Breast surgery     Social History   Social History  . Marital Status: Divorced    Spouse Name: N/A  . Number of Children: 0  . Years of Education: N/A   Occupational History  . disability     mental illness  Social History Main Topics  . Smoking status: Current Every Day Smoker -- 29 years    Types: Cigarettes, E-cigarettes  . Smokeless tobacco: Never Used  . Alcohol Use: No     Comment: "rare"  . 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 Sexual Activity: No     Comment: abortion at 89 and 70yrs. of age    Other Topics Concern  . None   Social History Narrative   Marital status: divorced; not dating      Children: none      Lives: alone with mom      Employment: disability for mental illness in 1997.  Hospitalizations x 9 in past.      Tobacco: 1 ppd x since age 9.      Alcohol: socially; beers.      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      Family History  Problem Relation Age of Onset  . Diabetes Mother   . Hypertension Mother   . Stroke Mother 20    CVA  . Mental illness Mother     no diagnosis; personality disorder   . Hyperlipidemia Father   . Hypertension Father   . COPD Father   . Alpha-1 antitrypsin deficiency Father   . Diabetes Maternal Grandmother   . Heart disease Maternal Grandmother   . Hyperlipidemia Maternal Grandmother   . Hypertension Maternal Grandmother   . Mental illness Maternal Grandmother   . Heart disease Maternal Grandfather   . Hyperlipidemia Maternal Grandfather   . Hypertension Maternal Grandfather   . Heart disease Paternal Grandfather   . Hyperlipidemia Paternal Grandfather   . Hypertension Paternal Grandfather   . Stroke Paternal Grandfather   . Alpha-1 antitrypsin deficiency Brother    Allergies  Allergen Reactions  . Sulfamethoxazole Anaphylaxis  . Oxycodone Other (See Comments)    Severe blindness?  . Penicillins Hives  . Other Anxiety    steroids  . Percocet [Oxycodone-Acetaminophen] Palpitations  . Sulfa Antibiotics Hives  . Sulfites Other (See Comments)    unknown   Prior to Admission medications   Medication Sig Start Date End Date Taking? Authorizing Provider  albuterol (PROVENTIL HFA;VENTOLIN HFA) 108 (90 BASE) MCG/ACT inhaler Inhale 2 puffs into the lungs every 6 (six) hours as needed for wheezing or shortness of breath (cough, shortness of breath or wheezing.). 09/20/14   Wardell Honour, MD  ARIPiprazole (ABILIFY) 5 MG tablet Take 5 mg by mouth daily. 07/04/14   Historical Provider, MD  celecoxib (CELEBREX) 200 MG capsule Take 1 capsule (200 mg total) by mouth 2 (two) times daily. 10/12/14   Wardell Honour, MD  clonazePAM (KLONOPIN) 2 MG tablet Take 4 mg by mouth at bedtime as needed. For sleep 06/28/14   Historical Provider, MD  diphenhydrAMINE (BENADRYL) 25 MG tablet Take 50-100 mg by mouth every 8 (eight) hours as needed for itching, allergies or sleep.     Historical Provider, MD  EPINEPHrine (EPIPEN 2-PAK) 0.3 mg/0.3 mL IJ SOAJ injection Inject 0.3 mLs (0.3 mg total) into the muscle once. 09/20/14   Wardell Honour, MD  HYDROcodone-acetaminophen  (NORCO/VICODIN) 5-325 MG tablet Take 1 tablet by mouth every 6 (six) hours as needed for moderate pain.    Historical Provider, MD  ibuprofen (ADVIL,MOTRIN) 800 MG tablet Take 1 tablet (800 mg total) by mouth every 8 (eight) hours as needed. Take  with FOOD, no other NSAIDS ie Celebrex 02/08/15   Easton Fetty P Elmina Hendel, DO  Magnesium 250 MG TABS Take 250 mg by mouth daily.     Historical Provider, MD  Multiple Vitamin (MULTIVITAMIN WITH MINERALS) TABS tablet Take 1 tablet by mouth daily. Vitamin protein shake    Historical Provider, MD  nicotine (NICODERM CQ - DOSED IN MG/24 HOURS) 21 mg/24hr patch  06/18/14   Historical Provider, MD     ROS: The patient denies fevers, chills, night sweats, unintentional weight loss, chest pain, palpitations, wheezing, dyspnea on exertion, nausea, vomiting, abdominal pain, dysuria, hematuria, melena  All other systems have been reviewed and were otherwise negative with the exception of those mentioned in the HPI and as above.    PHYSICAL EXAM: Filed Vitals:   02/15/15 1447  BP: 110/86  Pulse: 88  Temp: 98.6 F (37 C)  Resp: 18   Body mass index is 21.15 kg/(m^2).   General: Alert, no acute distress, anxious HEENT:  Normocephalic, atraumatic, oropharynx patent. EOMI, PERRLA Cardiovascular:  Regular rate and rhythm, no rubs murmurs or gallops.  No Carotid bruits, radial pulse intact. No pedal edema.  Respiratory: Clear to auscultation bilaterally.  No wheezes, rales, or rhonchi.  No cyanosis, no use of accessory musculature Abdominal: No organomegaly, abdomen is soft and non-tender, positive bowel sounds. No masses. Skin: No rashes. Neurologic: Facial musculature symmetric. Psychiatric: Patient acts appropriately throughout our interaction. Lymphatic: No cervical or submandibular lymphadenopathy Musculoskeletal: Gait intact. No edema, tenderness   LABS: Results for orders placed or performed in visit on 02/08/15  COMPLETE METABOLIC PANEL WITH GFR  Result Value  Ref Range   Sodium 137 135 - 146 mmol/L   Potassium 3.7 3.5 - 5.3 mmol/L   Chloride 104 98 - 110 mmol/L   CO2 24 20 - 31 mmol/L   Glucose, Bld 97 65 - 99 mg/dL   BUN 13 7 - 25 mg/dL   Creat 0.72 0.50 - 1.10 mg/dL   Total Bilirubin 0.6 0.2 - 1.2 mg/dL   Alkaline Phosphatase 48 33 - 115 U/L   AST 15 10 - 30 U/L   ALT 15 6 - 29 U/L   Total Protein 6.5 6.1 - 8.1 g/dL   Albumin 4.3 3.6 - 5.1 g/dL   Calcium 8.9 8.6 - 10.2 mg/dL   GFR, Est African American >89 >=60 mL/min   GFR, Est Non African American >89 >=60 mL/min  Cortisol  Result Value Ref Range   Cortisol, Plasma 29.7 ug/dL  T4, Free  Result Value Ref Range   Free T4 1.20 0.80 - 1.80 ng/dL  Insulin-like growth factor  Result Value Ref Range   IGF-I, LC/MS 166 52 - 328 ng/mL   Z-Score (Female) 0.3 -2.0-+2.0 SD  POCT CBC  Result Value Ref Range   WBC 7.7 4.6 - 10.2 K/uL   Lymph, poc 1.4 0.6 - 3.4   POC LYMPH PERCENT 18.7 10 - 50 %L   MID (cbc) 0.6 0 - 0.9   POC MID % 7.5 0 - 12 %M   POC Granulocyte 5.7 2 - 6.9   Granulocyte percent 73.8 37 - 80 %G   RBC 4.64 4.04 - 5.48 M/uL   Hemoglobin 15.0 12.2 - 16.2 g/dL   HCT, POC 43.3 37.7 - 47.9 %   MCV 93.4 80 - 97 fL   MCH, POC 32.4 (A) 27 - 31.2 pg   MCHC 34.7 31.8 - 35.4 g/dL   RDW, POC 13.1 %   Platelet  Count, POC 205 142 - 424 K/uL   MPV 6.6 0 - 99.8 fL     EKG/XRAY:   Primary read interpreted by Dr. Marin Comment at [facility].   ASSESSMENT/PLAN: Encounter Diagnoses  Name Primary?  . Facial swelling Yes  . Facial pain   . Pituitary microadenoma ([place])   . 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 Noncompliance with medications    MRI pending Awaiting labs to see what else needs to be ordered I can;t appreciate any acromegaly, Catherine Olsen looks the same as she did when I first met her. We discussed again her bipolar meds but she is not interested in taking any of it  Gross sideeffects, risk and benefits, and alternatives of medications d/w patient. Patient is aware that all medications  have potential sideeffects and we are unable to predict every sideeffect or drug-drug interaction that may occur.  Marquerite Forsman DO  02/16/2015 11:27 AM   02/16/11--Spoke with Catherine Olsen today about MRI results

## 2015-02-17 ENCOUNTER — Telehealth: Payer: Self-pay

## 2015-02-17 NOTE — Telephone Encounter (Signed)
Pt is wanting to know if dr Marin Comment has received her fax that she has been trying to send to [place] since Thursday

## 2015-02-19 ENCOUNTER — Encounter: Payer: Self-pay | Admitting: Family Medicine

## 2015-02-20 ENCOUNTER — Other Ambulatory Visit: Payer: Self-pay | Admitting: Family Medicine

## 2015-02-20 DIAGNOSIS — Z87898 Personal history of other specified conditions: Secondary | ICD-10-CM

## 2015-02-20 DIAGNOSIS — Z872 Personal history of diseases of the skin and subcutaneous tissue: Secondary | ICD-10-CM

## 2015-02-20 DIAGNOSIS — R739 Hyperglycemia, unspecified: Secondary | ICD-10-CM

## 2015-02-22 ENCOUNTER — Encounter: Payer: Self-pay | Admitting: Family Medicine

## 2015-02-22 ENCOUNTER — Other Ambulatory Visit (INDEPENDENT_AMBULATORY_CARE_PROVIDER_SITE_OTHER): Payer: Medicare Other

## 2015-02-22 DIAGNOSIS — Z87898 Personal history of other specified conditions: Secondary | ICD-10-CM

## 2015-02-22 DIAGNOSIS — R739 Hyperglycemia, unspecified: Secondary | ICD-10-CM

## 2015-02-22 DIAGNOSIS — Z872 Personal history of diseases of the skin and subcutaneous tissue: Secondary | ICD-10-CM

## 2015-02-22 DIAGNOSIS — R7309 Other abnormal glucose: Secondary | ICD-10-CM | POA: Diagnosis not present

## 2015-02-22 LAB — TESTOSTERONE, FREE, TOTAL, SHBG
Sex Hormone Binding: 106 nmol/L (ref 17–124)
Testosterone, Free: 2.6 pg/mL (ref 0.6–6.8)
Testosterone-% Free: 0.8 % (ref 0.4–2.4)
Testosterone: 33 ng/dL (ref 10–70)

## 2015-02-23 ENCOUNTER — Encounter: Payer: Self-pay | Admitting: Family Medicine

## 2015-02-23 DIAGNOSIS — N912 Amenorrhea, unspecified: Secondary | ICD-10-CM | POA: Diagnosis not present

## 2015-02-23 DIAGNOSIS — N83201 Unspecified ovarian cyst, right side: Secondary | ICD-10-CM | POA: Diagnosis not present

## 2015-02-23 LAB — HEMOGLOBIN A1C
Hgb A1c MFr Bld: 5.5 % (ref <5.7)
Mean Plasma Glucose: 111 mg/dL (ref <117)

## 2015-02-23 LAB — CORTISOL-AM, BLOOD: Cortisol - AM: 10.5 ug/dL (ref 4.3–22.4)

## 2015-02-26 DIAGNOSIS — N912 Amenorrhea, unspecified: Secondary | ICD-10-CM | POA: Diagnosis not present

## 2015-02-27 ENCOUNTER — Encounter: Payer: Self-pay | Admitting: Family Medicine

## 2015-03-04 ENCOUNTER — Encounter: Payer: Self-pay | Admitting: Family Medicine

## 2015-03-04 DIAGNOSIS — Z720 Tobacco use: Secondary | ICD-10-CM | POA: Insufficient documentation

## 2015-03-04 HISTORY — DX: Tobacco use: Z72.0

## 2015-03-05 ENCOUNTER — Other Ambulatory Visit: Payer: Medicare Other

## 2015-03-05 DIAGNOSIS — G894 Chronic pain syndrome: Secondary | ICD-10-CM | POA: Diagnosis not present

## 2015-03-05 DIAGNOSIS — Z79899 Other long term (current) drug therapy: Secondary | ICD-10-CM | POA: Diagnosis not present

## 2015-03-05 DIAGNOSIS — M4124 Other idiopathic scoliosis, thoracic region: Secondary | ICD-10-CM | POA: Diagnosis not present

## 2015-03-05 DIAGNOSIS — Z79891 Long term (current) use of opiate analgesic: Secondary | ICD-10-CM | POA: Diagnosis not present

## 2015-03-05 DIAGNOSIS — M4726 Other spondylosis with radiculopathy, lumbar region: Secondary | ICD-10-CM | POA: Diagnosis not present

## 2015-03-06 ENCOUNTER — Other Ambulatory Visit: Payer: Self-pay | Admitting: Orthopaedic Surgery

## 2015-03-06 DIAGNOSIS — M4306 Spondylolysis, lumbar region: Secondary | ICD-10-CM

## 2015-03-07 ENCOUNTER — Other Ambulatory Visit: Payer: Self-pay | Admitting: Family Medicine

## 2015-03-07 MED ORDER — HYDROCODONE-ACETAMINOPHEN 5-325 MG PO TABS: 1.0000 | ORAL_TABLET | Freq: Four times a day (QID) | ORAL | Status: DC | PRN

## 2015-03-10 ENCOUNTER — Encounter: Payer: Self-pay | Admitting: Family Medicine

## 2015-03-14 ENCOUNTER — Encounter: Payer: Self-pay | Admitting: Family Medicine

## 2015-03-15 DIAGNOSIS — F17210 Nicotine dependence, cigarettes, uncomplicated: Secondary | ICD-10-CM | POA: Diagnosis not present

## 2015-03-15 DIAGNOSIS — D352 Benign neoplasm of pituitary gland: Secondary | ICD-10-CM | POA: Diagnosis not present

## 2015-03-15 DIAGNOSIS — Z882 Allergy status to sulfonamides status: Secondary | ICD-10-CM | POA: Diagnosis not present

## 2015-03-15 DIAGNOSIS — Z885 Allergy status to narcotic agent status: Secondary | ICD-10-CM | POA: Diagnosis not present

## 2015-03-17 ENCOUNTER — Encounter: Payer: Self-pay | Admitting: Family Medicine

## 2015-03-21 DIAGNOSIS — D352 Benign neoplasm of pituitary gland: Secondary | ICD-10-CM | POA: Diagnosis not present

## 2015-03-24 ENCOUNTER — Encounter: Payer: Self-pay | Admitting: Family Medicine

## 2015-03-24 ENCOUNTER — Telehealth: Payer: Self-pay

## 2015-03-24 DIAGNOSIS — D497 Neoplasm of unspecified behavior of endocrine glands and other parts of nervous system: Secondary | ICD-10-CM

## 2015-03-24 DIAGNOSIS — J324 Chronic pansinusitis: Secondary | ICD-10-CM

## 2015-03-24 DIAGNOSIS — H539 Unspecified visual disturbance: Secondary | ICD-10-CM

## 2015-03-24 NOTE — Telephone Encounter (Signed)
Requesting pain medication refills - headache and sinus issue "serious pain and swelling" serious head pain and facial pain.    [phone]

## 2015-03-26 ENCOUNTER — Inpatient Hospital Stay: Admission: RE | Admit: 2015-03-26 | Payer: Medicare Other | Source: Ambulatory Visit

## 2015-03-26 NOTE — Telephone Encounter (Signed)
Dr Marin Comment pt has been contacting you via mychart. Please advise.

## 2015-03-27 ENCOUNTER — Other Ambulatory Visit: Payer: Self-pay | Admitting: Family Medicine

## 2015-03-27 ENCOUNTER — Encounter: Payer: Self-pay | Admitting: Family Medicine

## 2015-03-27 MED ORDER — HYDROCODONE-ACETAMINOPHEN 5-325 MG PO TABS: 1.0000 | ORAL_TABLET | Freq: Two times a day (BID) | ORAL | Status: DC | PRN

## 2015-03-27 NOTE — Telephone Encounter (Signed)
Pt called this morning frantic about her headache medicine and hasn't heard from anyone, she looked in [facility] but it isn't any messages from anyone and she would like a call back as soon as Dr Marin Comment comes in Really anxious. Please call [phone]

## 2015-03-27 NOTE — Telephone Encounter (Signed)
Dr Marin Comment has spoken to pt on the phone.

## 2015-03-27 NOTE — Telephone Encounter (Signed)
Pt has p/up Rx.

## 2015-03-27 NOTE — Telephone Encounter (Signed)
Spoke with patient, refill norco, sinus sxs, refer to ENT?

## 2015-04-06 ENCOUNTER — Telehealth: Payer: Self-pay

## 2015-04-06 ENCOUNTER — Encounter: Payer: Self-pay | Admitting: Family Medicine

## 2015-04-06 DIAGNOSIS — J0100 Acute maxillary sinusitis, unspecified: Secondary | ICD-10-CM

## 2015-04-06 MED ORDER — AZITHROMYCIN 250 MG PO TABS: ORAL_TABLET | ORAL | Status: DC

## 2015-04-06 NOTE — Telephone Encounter (Signed)
Message is for Dr. Marin Comment, Pt thinks she has pneumonia and she needs to know what Dr. Marin Comment think she should do?   Please advise  [phone]

## 2015-04-06 NOTE — Telephone Encounter (Signed)
Spoke with pt, she is unable to get here because her truck is dead. She is has sinus irritation, pain in ears, face pain, swollen lymph nodes, lungs feel tight, and yellow stuff coming out of her nose. Please advise.

## 2015-04-06 NOTE — Telephone Encounter (Signed)
Sinus infection , no cough,  Green dc, no fevers orchills. , going to ear, rx z pack. One time only. Needs further eval if not improved.

## 2015-04-08 ENCOUNTER — Encounter: Payer: Self-pay | Admitting: Family Medicine

## 2015-04-11 ENCOUNTER — Encounter: Payer: Self-pay | Admitting: Family Medicine

## 2015-04-12 ENCOUNTER — Other Ambulatory Visit: Payer: Self-pay | Admitting: Family Medicine

## 2015-04-12 MED ORDER — TRETINOIN 0.025 % EX CREA: TOPICAL_CREAM | Freq: Every day | CUTANEOUS | Status: DC

## 2015-04-12 MED ORDER — HYDROCODONE-ACETAMINOPHEN 5-325 MG PO TABS: 1.0000 | ORAL_TABLET | Freq: Two times a day (BID) | ORAL | Status: DC | PRN

## 2015-04-17 ENCOUNTER — Encounter: Payer: Self-pay | Admitting: Family Medicine

## 2015-04-18 ENCOUNTER — Ambulatory Visit (INDEPENDENT_AMBULATORY_CARE_PROVIDER_SITE_OTHER): Payer: Medicare Other | Admitting: Allergy and Immunology

## 2015-04-18 ENCOUNTER — Encounter: Payer: Self-pay | Admitting: Allergy and Immunology

## 2015-04-18 ENCOUNTER — Telehealth: Payer: Self-pay

## 2015-04-18 VITALS — BP 96/70 | HR 64 | Temp 98.0°F | Resp 12

## 2015-04-18 DIAGNOSIS — J0111 Acute recurrent frontal sinusitis: Secondary | ICD-10-CM | POA: Diagnosis not present

## 2015-04-18 DIAGNOSIS — Z720 Tobacco use: Secondary | ICD-10-CM | POA: Diagnosis not present

## 2015-04-18 DIAGNOSIS — J45901 Unspecified asthma with (acute) exacerbation: Secondary | ICD-10-CM | POA: Insufficient documentation

## 2015-04-18 DIAGNOSIS — J310 Chronic rhinitis: Secondary | ICD-10-CM

## 2015-04-18 DIAGNOSIS — J019 Acute sinusitis, unspecified: Secondary | ICD-10-CM | POA: Insufficient documentation

## 2015-04-18 MED ORDER — LEVALBUTEROL HCL 1.25 MG/3ML IN NEBU
1.2500 mg | INHALATION_SOLUTION | Freq: Once | RESPIRATORY_TRACT | Status: AC
  Administered 2015-04-18: 1.25 mg via RESPIRATORY_TRACT

## 2015-04-18 MED ORDER — BECLOMETHASONE DIPROPIONATE 40 MCG/ACT IN AERS
INHALATION_SPRAY | RESPIRATORY_TRACT | Status: DC
Start: 2015-04-18 — End: 2017-10-29

## 2015-04-18 MED ORDER — BUDESONIDE 90 MCG/ACT IN AEPB: INHALATION_SPRAY | RESPIRATORY_TRACT | Status: DC

## 2015-04-18 MED ORDER — EPINEPHRINE 0.3 MG/0.3ML IJ SOAJ: 0.3000 mg | Freq: Once | INTRAMUSCULAR | Status: DC

## 2015-04-18 MED ORDER — ALBUTEROL SULFATE HFA 108 (90 BASE) MCG/ACT IN AERS: 2.0000 | INHALATION_SPRAY | Freq: Four times a day (QID) | RESPIRATORY_TRACT | Status: DC | PRN

## 2015-04-18 NOTE — Telephone Encounter (Signed)
Called patient to let her know that her insurance will not pay for Qvar 40 mcg, however we sent in Pulmicort 90 mcg which was preferred.  Patient states she will not use a powder form inhaler due to the fact she tried one in the past and it caused hair loss.  I told patient I will discuss with the doctor and see if we can do a prior authorization to see if the Qvar will be approved due to reaction.  Patient voiced understood.

## 2015-04-18 NOTE — Assessment & Plan Note (Addendum)
   Continue/complete prednisone taper as prescribed.  During respiratory tract infections and asthma flares, the patient is to add Qvar 40 g, 2 inhalations twice a day until symptoms have returned to baseline.  A sample and prescription have been provided for Qvar 40 g.  Continue albuterol HFA every 4-6 hours as needed.  A refill prescription has been provided.  The patient has been asked to contact me if her symptoms persist or progress. Otherwise, she may return for follow up in 4 months.

## 2015-04-18 NOTE — Telephone Encounter (Signed)
[facility] [phone] Phone:  [phone] and spoke with Suezanne Jacquet and cancelled Pulmicort 90 mcg, per Dr. Verlin Fester.  Patient states will not use powder inhalers due to having hair loss after using in the past.

## 2015-04-18 NOTE — Patient Instructions (Addendum)
Asthma with acute exacerbation  Continue/complete prednisone taper as prescribed.  During respiratory tract infections and asthma flares, the patient is to add Qvar 40 g, 2 inhalations twice a day until symptoms have returned to baseline.  A sample and prescription have been provided for Qvar 40 g.  Continue albuterol HFA every 4-6 hours as needed.  A refill prescription has been provided.  The patient has been asked to contact me if her symptoms persist or progress. Otherwise, she may return for follow up in 4 months.  Acute sinusitis Acute on chronic sinusitis. The patient has refused all medicated nasal sprays.  Continue/complete prednisone taper.  Nasal saline lavage (NeilMed) 2 or 3 times per day has been recommended along with instructions for proper administration.  She has a previously scheduled otolaryngology consultation on April 26, 2015.  Chronic rhinitis Non-allergic rhinitis.   Catherine Olsen has refused all medications offered, including medicated nasal sprays.  I have recommended nasal saline lavage 2 or 3 times daily as needed.  Tobacco abuse  Tobacco cessation has been discussed and encouraged. Catherine Olsen is not willing to consider quitting at this time.    Return in about 4 months (around 08/16/2015), or if symptoms worsen or fail to improve.

## 2015-04-18 NOTE — Assessment & Plan Note (Signed)
Non-allergic rhinitis.   Catherine Olsen has refused all medications offered, including medicated nasal sprays.  I have recommended nasal saline lavage 2 or 3 times daily as needed.

## 2015-04-18 NOTE — Assessment & Plan Note (Addendum)
Acute on chronic sinusitis. The patient has refused all medicated nasal sprays.  Continue/complete prednisone taper.  Nasal saline lavage (NeilMed) 2 or 3 times per day has been recommended along with instructions for proper administration.  She has a previously scheduled otolaryngology consultation on April 26, 2015.

## 2015-04-18 NOTE — Assessment & Plan Note (Signed)
   Tobacco cessation has been discussed and encouraged. Catherine Olsen is not willing to consider quitting at this time.

## 2015-04-18 NOTE — Progress Notes (Signed)
Follow-up Note  RE: Catherine Olsen MRN: [id number] DOB: 29-Sep-1970 Date of Office Visit: 04/18/2015  Primary care provider: Leotis Pain, DO Referring provider: Wardell Honour, MD  History of present illness: HPI Comments: Catherine Olsen is a 45 y.o. female with asthma who presents today for sick visit.  She reports that since turning on the heat in her house, the dry/hot air his caused sinus symptoms.  The sinus pressure is described as "horrible pain" and is located over the frontal and ethmoid sinuses.  The sinus pressure has increased over the past week.  She also complains of nasal congestion.  Previous aeroallergen skin tests were negative despite a positive histamine control.  Yesterday she had an asthma exacerbation, describing it as "the worst asthma I have ever had."  She called the on-call doctor, Dr. Shaune Leeks, last night and he prescribed prednisone, 20 mg x 4 days, 10 mg x1 day, which she has started with some symptom relief.  He asked her to be seen in the office today.  She denies fevers, chills, or discolored mucus production.  She took a Z-Pak approximately 2 weeks ago for bacterial sinusitis.  She is scheduled for otolaryngology consultation on January 19.  She smokes about half a pack of cigarettes per day and is not ready to consider quitting at this point.    Assessment and plan: Asthma with acute exacerbation  Continue/complete prednisone taper as prescribed.  During respiratory tract infections and asthma flares, the patient is to add Qvar 40 g, 2 inhalations twice a day until symptoms have returned to baseline.  A sample and prescription have been provided for Qvar 40 g.  Continue albuterol HFA every 4-6 hours as needed.  A refill prescription has been provided.  The patient has been asked to contact me if her symptoms persist or progress. Otherwise, she may return for follow up in 4 months.  Acute sinusitis Acute on chronic sinusitis. The patient has refused all  medicated nasal sprays.  Continue/complete prednisone taper.  Nasal saline lavage (NeilMed) 2 or 3 times per day has been recommended along with instructions for proper administration.  She has a previously scheduled otolaryngology consultation on April 26, 2015.  Chronic rhinitis Non-allergic rhinitis.   Catherine Olsen has refused all medications offered, including medicated nasal sprays.  I have recommended nasal saline lavage 2 or 3 times daily as needed.  Tobacco abuse  Tobacco cessation has been discussed and encouraged. Catherine Olsen is not willing to consider quitting at this time.    Meds ordered this encounter  Medications  . albuterol (PROVENTIL HFA;VENTOLIN HFA) 108 (90 Base) MCG/ACT inhaler    Sig: Inhale 2 puffs into the lungs every 6 (six) hours as needed for wheezing or shortness of breath (cough, shortness of breath or wheezing.).    Dispense:  1 Inhaler    Refill:  1    Patient prefers Proair.  Marland Kitchen EPINEPHrine (EPIPEN 2-PAK) 0.3 mg/0.3 mL IJ SOAJ injection    Sig: Inject 0.3 mLs (0.3 mg total) into the muscle once.    Dispense:  2 Device    Refill:  1  . Budesonide (PULMICORT FLEXHALER) 90 MCG/ACT inhaler    Sig: Two puffs twice daily for asthma flare.  Rinse, gargle and spit with water after use.    Dispense:  1 Inhaler    Refill:  2  . levalbuterol (XOPENEX) nebulizer solution 1.25 mg    Sig:     Diagnositics: Spirometry reveals FVC of 3.38 L (  94% predicted) and an FEV1 of 2.44 L (82% predicted) with significant (260 mL) postbronchodilator improvement    Physical examination: Blood pressure 96/70, pulse 64, temperature 98 F (36.7 C), resp. rate 12, SpO2 97 %.  General: Alert, interactive, in no acute distress. HEENT: TMs pearly gray, turbinates edematous with crusty discharge, post-pharynx erythematous. Neck: Supple without lymphadenopathy. Lungs: Clear to auscultation without wheezing, rhonchi or rales. CV: Normal S1, S2 without murmurs. Skin: Warm and dry,  without lesions or rashes.  The following portions of the patient's history were reviewed and updated as appropriate: allergies, current medications, past family history, past medical history, past social history, past surgical history and problem list.    Medication List       This list is accurate as of: 04/18/15  5:24 PM.  Always use your most recent med list.               albuterol 108 (90 Base) MCG/ACT inhaler  Commonly known as:  PROVENTIL HFA;VENTOLIN HFA  Inhale 2 puffs into the lungs every 6 (six) hours as needed for wheezing or shortness of breath (cough, shortness of breath or wheezing.).     ARIPiprazole 5 MG tablet  Commonly known as:  ABILIFY  Take 5 mg by mouth daily. Reported on 04/18/2015     azithromycin 250 MG tablet  Commonly known as:  ZITHROMAX  Take 2 tabs po now then 1 tab po daily for the next 4 days     Budesonide 90 MCG/ACT inhaler  Commonly known as:  PULMICORT FLEXHALER  Two puffs twice daily for asthma flare.  Rinse, gargle and spit with water after use.     celecoxib 200 MG capsule  Commonly known as:  CELEBREX  Take 1 capsule (200 mg total) by mouth 2 (two) times daily.     CITRACAL PO  Take by mouth.     clonazePAM 2 MG tablet  Commonly known as:  KLONOPIN  Take 4 mg by mouth at bedtime as needed. For sleep     diphenhydrAMINE 25 MG tablet  Commonly known as:  BENADRYL  Take 50-100 mg by mouth every 8 (eight) hours as needed for itching, allergies or sleep.     EPINEPHrine 0.3 mg/0.3 mL Soaj injection  Commonly known as:  EPIPEN 2-PAK  Inject 0.3 mLs (0.3 mg total) into the muscle once.     HYDROcodone-acetaminophen 5-325 MG tablet  Commonly known as:  NORCO/VICODIN  Take 1 tablet by mouth every 12 (twelve) hours as needed for moderate pain.     ibuprofen 800 MG tablet  Commonly known as:  ADVIL,MOTRIN  Take 1 tablet (800 mg total) by mouth every 8 (eight) hours as needed. Take with FOOD, no other NSAIDS ie Celebrex      Magnesium 250 MG Tabs  Take 250 mg by mouth daily.     multivitamin with minerals Tabs tablet  Take 1 tablet by mouth daily. Vitamin protein shake     nicotine 21 mg/24hr patch  Commonly known as:  NICODERM CQ - dosed in mg/24 hours     predniSONE 20 MG tablet  Commonly known as:  DELTASONE  Take 20 mg by mouth daily with breakfast.     tretinoin 0.025 % cream  Commonly known as:  RETIN-A  Apply topically at bedtime.        Allergies  Allergen Reactions  . Sulfamethoxazole Anaphylaxis  . Oxycodone Other (See Comments)    Severe blindness?  . Penicillins Hives  .  Other Anxiety    steroids  . Percocet [Oxycodone-Acetaminophen] Palpitations  . Sulfa Antibiotics Hives  . Sulfites Other (See Comments)    unknown   Review of systems: Constitutional: Negative for fever, chills and weight loss.  HENT: Negative for nosebleeds.   Positive for sinus pressure/pain and nasal congestion. Eyes: Negative for blurred vision.  Respiratory: Negative for hemoptysis.   Positive for coughing, dyspnea, and wheezing. Cardiovascular: Negative for chest pain.  Gastrointestinal: Negative for diarrhea and constipation.  Genitourinary: Negative for dysuria.  Musculoskeletal: Positive for joint pain.  Neurological: Negative for dizziness.  Endo/Heme/Allergies: Does not bruise/bleed easily.   Past Medical History  Diagnosis Date  . Asthma   . Anxiety   . Mental disorder   . Bipolar 1 disorder ([place])   . Endometriosis   . PONV (postoperative nausea and vomiting)   . Termination of pregnancy     x 2 at age 84 and 45 yrs old  . Depression   . Headache(784.0)     otc med prn  . Arthritis     hands, lower back, knees  . Fibromyalgia   . Scoliosis   . Scoliosis   . Allergy     Family History  Problem Relation Age of Onset  . Diabetes Mother   . Hypertension Mother   . Stroke Mother 27    CVA  . Mental illness Mother     no diagnosis; personality disorder  . Hyperlipidemia Father     . Hypertension Father   . COPD Father   . Alpha-1 antitrypsin deficiency Father   . Diabetes Maternal Grandmother   . Heart disease Maternal Grandmother   . Hyperlipidemia Maternal Grandmother   . Hypertension Maternal Grandmother   . Mental illness Maternal Grandmother   . Heart disease Maternal Grandfather   . Hyperlipidemia Maternal Grandfather   . Hypertension Maternal Grandfather   . Heart disease Paternal Grandfather   . Hyperlipidemia Paternal Grandfather   . Hypertension Paternal Grandfather   . Stroke Paternal Grandfather   . Alpha-1 antitrypsin deficiency Brother     Social History   Social History  . Marital Status: Divorced    Spouse Name: N/A  . Number of Children: 0  . Years of Education: N/A   Occupational History  . disability     mental illness   Social History Main Topics  . Smoking status: Current Every Day Smoker -- 1.00 packs/day for 29 years    Types: Cigarettes, E-cigarettes  . Smokeless tobacco: Never Used  . Alcohol Use: No     Comment: "rare"  . 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 Sexual Activity: No     Comment: abortion at 62 and 50yrs. of age    Other Topics Concern  . Not on file   Social History Narrative   Marital status: divorced; not dating      Children: none      Lives: alone with mom      Employment: disability for mental illness in 1997.  Hospitalizations x 9 in past.      Tobacco: 1 ppd x since age 58.      Alcohol: socially; beers.      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       I appreciate the opportunity to take part in this Catherine Olsen's care. Please do not hesitate to contact me with questions.  Sincerely,   R. Edgar Frisk, MD

## 2015-04-19 MED ORDER — BECLOMETHASONE DIPROPIONATE 40 MCG/ACT IN AERS: INHALATION_SPRAY | RESPIRATORY_TRACT | Status: DC

## 2015-04-19 NOTE — Telephone Encounter (Signed)
Spoke with Dr. Verlin Fester about this situation and he said to go ahead and send in the Qvar 40 mcg and we can do a prior authorization.  Qvar 40 mcg was sent to the pharmacy.

## 2015-04-24 ENCOUNTER — Other Ambulatory Visit: Payer: Self-pay | Admitting: *Deleted

## 2015-04-24 MED ORDER — FLUTICASONE PROPIONATE HFA 110 MCG/ACT IN AERO: 2.0000 | INHALATION_SPRAY | Freq: Two times a day (BID) | RESPIRATORY_TRACT | Status: DC

## 2015-04-24 NOTE — Telephone Encounter (Signed)
Per Dr Verlin Fester changed Qvar 40 to Flovent 110 2 BID, patient notified and Rx sent

## 2015-04-26 DIAGNOSIS — R51 Headache: Secondary | ICD-10-CM | POA: Diagnosis not present

## 2015-05-03 ENCOUNTER — Other Ambulatory Visit: Payer: Self-pay | Admitting: Neurology

## 2015-05-03 MED ORDER — ALBUTEROL SULFATE HFA 108 (90 BASE) MCG/ACT IN AERS: 2.0000 | INHALATION_SPRAY | RESPIRATORY_TRACT | Status: DC | PRN

## 2015-05-03 NOTE — Telephone Encounter (Signed)
Switched patient to Ventolin per Dr Verlin Fester and due to insurance coverage.

## 2015-05-09 ENCOUNTER — Other Ambulatory Visit: Payer: Self-pay | Admitting: Obstetrics and Gynecology

## 2015-05-24 ENCOUNTER — Emergency Department (HOSPITAL_COMMUNITY): Payer: Medicare Other

## 2015-05-24 ENCOUNTER — Emergency Department (HOSPITAL_COMMUNITY)
Admission: EM | Admit: 2015-05-24 | Discharge: 2015-05-24 | Disposition: A | Discharge status: Home / Self Care | Payer: Medicare Other | Attending: Emergency Medicine | Admitting: Emergency Medicine

## 2015-05-24 ENCOUNTER — Encounter: Payer: Self-pay | Admitting: Family Medicine

## 2015-05-24 ENCOUNTER — Encounter (HOSPITAL_COMMUNITY): Payer: Self-pay | Admitting: Cardiology

## 2015-05-24 DIAGNOSIS — Z880 Allergy status to penicillin: Secondary | ICD-10-CM | POA: Diagnosis not present

## 2015-05-24 DIAGNOSIS — J45909 Unspecified asthma, uncomplicated: Secondary | ICD-10-CM | POA: Diagnosis not present

## 2015-05-24 DIAGNOSIS — R229 Localized swelling, mass and lump, unspecified: Secondary | ICD-10-CM | POA: Diagnosis not present

## 2015-05-24 DIAGNOSIS — M79602 Pain in left arm: Secondary | ICD-10-CM

## 2015-05-24 DIAGNOSIS — R2232 Localized swelling, mass and lump, left upper limb: Secondary | ICD-10-CM | POA: Diagnosis not present

## 2015-05-24 DIAGNOSIS — M19041 Primary osteoarthritis, right hand: Secondary | ICD-10-CM | POA: Insufficient documentation

## 2015-05-24 DIAGNOSIS — M19042 Primary osteoarthritis, left hand: Secondary | ICD-10-CM | POA: Insufficient documentation

## 2015-05-24 DIAGNOSIS — M47816 Spondylosis without myelopathy or radiculopathy, lumbar region: Secondary | ICD-10-CM | POA: Insufficient documentation

## 2015-05-24 DIAGNOSIS — F319 Bipolar disorder, unspecified: Secondary | ICD-10-CM | POA: Diagnosis not present

## 2015-05-24 DIAGNOSIS — Z7952 Long term (current) use of systemic steroids: Secondary | ICD-10-CM | POA: Insufficient documentation

## 2015-05-24 DIAGNOSIS — F17210 Nicotine dependence, cigarettes, uncomplicated: Secondary | ICD-10-CM | POA: Insufficient documentation

## 2015-05-24 DIAGNOSIS — Z8742 Personal history of other diseases of the female genital tract: Secondary | ICD-10-CM | POA: Insufficient documentation

## 2015-05-24 DIAGNOSIS — F419 Anxiety disorder, unspecified: Secondary | ICD-10-CM | POA: Diagnosis not present

## 2015-05-24 DIAGNOSIS — M1712 Unilateral primary osteoarthritis, left knee: Secondary | ICD-10-CM | POA: Insufficient documentation

## 2015-05-24 NOTE — ED Notes (Signed)
Pt updated on plan for [place] of arm

## 2015-05-24 NOTE — ED Notes (Signed)
Reports she noticed a small knot under the skin on the left upper arm. Reports the area is painful.

## 2015-05-24 NOTE — ED Provider Notes (Signed)
CSN: [id number]     Arrival date & time 05/24/15  1041 History  By signing my name below, I, Evelene Croon, attest that this documentation has been prepared under the direction and in the presence of non-physician practitioner, Hyman Bible, PA-C. Electronically Signed: Evelene Croon, Scribe. 05/24/2015. 12:45 PM.     Chief Complaint  Patient presents with  . Arm Pain    The history is provided by the patient. No language interpreter was used.    HPI Comments:  Catherine Olsen is a 45 y.o. female who presents to the Emergency Department complaining of a "pea sized hard mass" to her upper left arm which she first noticed ~ 1 week ago. She notes associated discomfort and irritation in the extremity. She denies LUE pain and recent injury. No alleviating factors noted. Pt has no other complaints or symptoms at this time.  No numbness, tingling, edema, fever, or chills.     Past Medical History  Diagnosis Date  . Asthma   . Anxiety   . Mental disorder   . Bipolar 1 disorder ([place])   . Endometriosis   . PONV (postoperative nausea and vomiting)   . Termination of pregnancy     x 2 at age 24 and 45 yrs old  . Depression   . Headache(784.0)     otc med prn  . Arthritis     hands, lower back, knees  . Fibromyalgia   . Scoliosis   . Scoliosis   . Allergy    Past Surgical History  Procedure Laterality Date  . Nose surgery      rhinoplasty at age 66 yrs  . Wisdom tooth extraction    . Facial cosmetic surgery      right cheek  . Laparoscopy Right 11/11/2013    Procedure: LAPAROSCOPY OPERATIVE with  Drainage of  RIGHT Ovarian ENDOMETRIOMA;  Surgeon: Elveria Royals, MD;  Location: [facility];  Service: Gynecology;  Laterality: Right;  . Dilation and curettage of uterus    . Breast surgery     Family History  Problem Relation Age of Onset  . Diabetes Mother   . Hypertension Mother   . Stroke Mother 51    CVA  . Mental illness Mother     no diagnosis; personality disorder  .  Hyperlipidemia Father   . Hypertension Father   . COPD Father   . Alpha-1 antitrypsin deficiency Father   . Diabetes Maternal Grandmother   . Heart disease Maternal Grandmother   . Hyperlipidemia Maternal Grandmother   . Hypertension Maternal Grandmother   . Mental illness Maternal Grandmother   . Heart disease Maternal Grandfather   . Hyperlipidemia Maternal Grandfather   . Hypertension Maternal Grandfather   . Heart disease Paternal Grandfather   . Hyperlipidemia Paternal Grandfather   . Hypertension Paternal Grandfather   . Stroke Paternal Grandfather   . Alpha-1 antitrypsin deficiency Brother    Social History  Substance Use Topics  . Smoking status: Current Every Day Smoker -- 1.00 packs/day for 29 years    Types: Cigarettes, E-cigarettes  . Smokeless tobacco: Never Used  . Alcohol Use: No     Comment: "rare"   OB History    Gravida Para Term Preterm AB TAB SAB Ectopic Multiple Living   2    2 2     0     Review of Systems  Constitutional: Negative for fever and chills.  Respiratory: Negative for shortness of breath.   Cardiovascular: Negative for chest  pain.  Skin:       + "knot" to LUE  All other systems reviewed and are negative.   Allergies  Sulfamethoxazole; Oxycodone; Penicillins; Other; Percocet; Sulfa antibiotics; and Sulfites  Home Medications   Prior to Admission medications   Medication Sig Start Date End Date Taking? Authorizing Provider  clonazePAM (KLONOPIN) 2 MG tablet Take 4 mg by mouth at bedtime as needed. For sleep 06/28/14  Yes Historical Provider, MD  ibuprofen (ADVIL,MOTRIN) 600 MG tablet TAKE 1 TABLET BY MOUTH EVERY 6 HOURS AS NEEDED 05/10/15  Yes Deirdre C Poe, CNM  albuterol (PROVENTIL HFA;VENTOLIN HFA) 108 (90 Base) MCG/ACT inhaler Inhale 2 puffs into the lungs every 6 (six) hours as needed for wheezing or shortness of breath (cough, shortness of breath or wheezing.). 04/18/15   Adelina Mings, MD  albuterol (VENTOLIN HFA) 108 (90  Base) MCG/ACT inhaler Inhale 2 puffs into the lungs every 4 (four) hours as needed for wheezing or shortness of breath. Patient not taking: Reported on 05/24/2015 05/03/15   Adelina Mings, MD  beclomethasone (QVAR) 40 MCG/ACT inhaler Use two puffs twice daily with an asthma flare.  Rinse, gargle and spit with water after use. Patient not taking: Reported on 05/24/2015 04/19/15   Adelina Mings, MD  beclomethasone (QVAR) 40 MCG/ACT inhaler Use two puffs twice daily for an asthma flare.  Rinse, gargle and spit after use. 04/18/15   Adelina Mings, MD  Budesonide (PULMICORT FLEXHALER) 90 MCG/ACT inhaler Two puffs twice daily for asthma flare.  Rinse, gargle and spit with water after use. Patient not taking: Reported on 05/24/2015 04/18/15   Adelina Mings, MD  celecoxib (CELEBREX) 200 MG capsule Take 1 capsule (200 mg total) by mouth 2 (two) times daily. Patient not taking: Reported on 05/24/2015 10/12/14   Wardell Honour, MD  EPINEPHrine (EPIPEN 2-PAK) 0.3 mg/0.3 mL IJ SOAJ injection Inject 0.3 mLs (0.3 mg total) into the muscle once. 04/18/15   Adelina Mings, MD  fluticasone (FLOVENT HFA) 110 MCG/ACT inhaler Inhale 2 puffs into the lungs 2 (two) times daily. Patient not taking: Reported on 05/24/2015 04/24/15   Adelina Mings, MD  HYDROcodone-acetaminophen (NORCO/VICODIN) 5-325 MG tablet Take 1 tablet by mouth every 12 (twelve) hours as needed for moderate pain. Patient not taking: Reported on 04/18/2015 04/12/15   Thao P Le, DO  ibuprofen (ADVIL,MOTRIN) 600 MG tablet TAKE 1 TABLET BY MOUTH EVERY 6 HOURS AS NEEDED Patient not taking: Reported on 05/24/2015 05/10/15   Deirdre C Poe, CNM  ibuprofen (ADVIL,MOTRIN) 800 MG tablet Take 1 tablet (800 mg total) by mouth every 8 (eight) hours as needed. Take with FOOD, no other NSAIDS ie Celebrex Patient not taking: Reported on 04/18/2015 02/08/15   Thao P Le, DO  tretinoin (RETIN-A) 0.025 % cream Apply topically at bedtime. Patient not  taking: Reported on 05/24/2015 04/12/15 04/11/16  Thao P Le, DO   BP 106/83 mmHg  Pulse 85  Temp(Src) 98.2 F (36.8 C) (Oral)  Resp 16  SpO2 99%  LMP 11/21/2014 Physical Exam  Constitutional: She is oriented to person, place, and time. She appears well-developed and well-nourished. No distress.  HENT:  Head: Normocephalic and atraumatic.  Eyes: Conjunctivae are normal.  Cardiovascular: Normal rate, regular rhythm and normal heart sounds.   Pulmonary/Chest: Effort normal and breath sounds normal.  Abdominal: She exhibits no distension.  Musculoskeletal:  Pea sized mobile nodule of the left lateral upper arm.  No surrounding erythema, edema, or warmth.  Neurological: She is alert and oriented to person, place, and time.  Skin: Skin is warm and dry.  Psychiatric: Her mood appears anxious.  Nursing note and vitals reviewed.   ED Course  Procedures  DIAGNOSTIC STUDIES:  Oxygen Saturation is 99% on RA, normal by my interpretation.    COORDINATION OF CARE:  12:40 PM Discussed treatment plan with pt at bedside and pt agreed to plan.  Imaging Review [place] Extrem Up Left Ltd  05/24/2015  CLINICAL DATA:  Left arm pain.  Appeared 1 week ago. EXAM: ULTRASOUND LEFT FOREARM UPPER EXTREMITY LIMITED TECHNIQUE: Ultrasound examination of the upper extremity soft tissues was performed in the area of clinical concern. COMPARISON:  None FINDINGS: There is a avascular 6 x 4 x 7 mm hypoechoic masses in the subcutaneous fat. There is no posterior acoustic enhancement or shadowing. There is no other solid or cystic mass. IMPRESSION: 1. 6 x 4 x 7 mm hypoechoic mass in the subcutaneous fat of the left forearm incompletely characterized. This may reflect a small hematoma versus sebaceous cyst versus complex cyst. Recommend further evaluation with an MRI of the left forearm without and with intravenous contrast. Electronically Signed   By: Kathreen Devoid   On: 05/24/2015 15:27   I have personally reviewed and  evaluated these images and lab results as part of my medical decision-making.    MDM   Final diagnoses:  Left arm pain  Patient presents today with a mobile nodular mass of the left upper arm.  She is very anxious about this mass.  No overlying signs of infection.  Ultrasound results as above showing a 60mm hypoechoic mass, which could represent a small hematoma vs sebaceous cyst vs complex cyst.  Results discussed with the patient.  Do not feel that MRI needs to be done on an emergent bases.  Patient states that she does have a PCP that she can follow up with.  Patient stable for discharge.  Return precautions given.   I personally performed the services described in this documentation, which was scribed in my presence. The recorded information has been reviewed and is accurate.    Hyman Bible, PA-C 05/26/15 1014  Carmin Muskrat, MD 05/30/15 1728

## 2015-05-24 NOTE — Discharge Instructions (Signed)
Follow up with your Primary Care Physician.  Return to the Emergency Department if symptoms worsen.  As you were told, it is recommended to have a MRI of the arm, but this does not need to be done emergently today.

## 2015-05-24 NOTE — ED Notes (Signed)
Patient is alert and orientedx4.  Patient was explained discharge instructions and they understood them with no questions.   

## 2015-05-25 ENCOUNTER — Other Ambulatory Visit: Payer: Self-pay | Admitting: Family Medicine

## 2015-05-25 DIAGNOSIS — R9389 Abnormal findings on diagnostic imaging of other specified body structures: Secondary | ICD-10-CM

## 2015-05-25 DIAGNOSIS — N951 Menopausal and female climacteric states: Secondary | ICD-10-CM | POA: Insufficient documentation

## 2015-05-25 DIAGNOSIS — R2232 Localized swelling, mass and lump, left upper limb: Secondary | ICD-10-CM

## 2015-05-25 DIAGNOSIS — M79602 Pain in left arm: Secondary | ICD-10-CM

## 2015-05-25 DIAGNOSIS — N801 Endometriosis of ovary: Secondary | ICD-10-CM | POA: Diagnosis not present

## 2015-05-27 ENCOUNTER — Encounter: Payer: Self-pay | Admitting: Family Medicine

## 2015-05-28 DIAGNOSIS — N951 Menopausal and female climacteric states: Secondary | ICD-10-CM | POA: Diagnosis not present

## 2015-05-31 ENCOUNTER — Telehealth: Payer: Self-pay

## 2015-05-31 NOTE — Telephone Encounter (Signed)
Amber from [facility] called for clarification regarding the order for the MRI of the humerus.  She said the MRI of the forearm will show the humerus, so it typically would not be needed in addition to the MRI forearm.  She wanted to know if Dr Marin Comment was looking for something in particular on the humerus, which is why a separate order was requested.  Please advise.  CB#: [id number] (main line for [facility])

## 2015-05-31 NOTE — Telephone Encounter (Signed)
Dr. Le  Please see previous message 

## 2015-06-01 ENCOUNTER — Other Ambulatory Visit: Payer: Self-pay | Admitting: Family Medicine

## 2015-06-01 DIAGNOSIS — R519 Headache, unspecified: Secondary | ICD-10-CM

## 2015-06-01 DIAGNOSIS — R51 Headache: Principal | ICD-10-CM

## 2015-06-01 NOTE — Telephone Encounter (Signed)
Called [facility]. LM for amber to call me back, need forearm MRI due to abnormality on xray, but patient states her pain is in her upper arm hence MRI of humerus was ordered.

## 2015-06-03 ENCOUNTER — Ambulatory Visit
Admission: RE | Admit: 2015-06-03 | Discharge: 2015-06-03 | Disposition: A | Discharge status: Home / Self Care | Payer: Medicare Other | Source: Ambulatory Visit | Attending: Family Medicine | Admitting: Family Medicine

## 2015-06-03 DIAGNOSIS — R9389 Abnormal findings on diagnostic imaging of other specified body structures: Secondary | ICD-10-CM

## 2015-06-03 DIAGNOSIS — R2232 Localized swelling, mass and lump, left upper limb: Secondary | ICD-10-CM | POA: Diagnosis not present

## 2015-06-03 DIAGNOSIS — M7989 Other specified soft tissue disorders: Secondary | ICD-10-CM | POA: Diagnosis not present

## 2015-06-03 DIAGNOSIS — M79602 Pain in left arm: Secondary | ICD-10-CM

## 2015-06-03 MED ORDER — GADOBENATE DIMEGLUMINE 529 MG/ML IV SOLN
12.0000 mL | Freq: Once | INTRAVENOUS | Status: AC | PRN
  Administered 2015-06-03: 12 mL via INTRAVENOUS

## 2015-06-06 ENCOUNTER — Telehealth: Payer: Self-pay

## 2015-06-06 NOTE — Telephone Encounter (Signed)
Spoke with pt, and advised Impression results from imaging. She states she does not feel there are lymph nodes in that area of concern. I advised her that a Radiologist read her mri and he feels it is just a lymph node. Pt was very rude and yelling on the phone. SHe wanted to know why nobody called her and she needs a phone call by 6pm or she will sue [place] because she has done it before. I called the MD lounge and Neoma Laming picked up. I asked Neoma Laming to talk to pt. She refused to talk to me any furthur being that I am not a MD or PA.

## 2015-06-06 NOTE — Telephone Encounter (Signed)
Patient states she's flipping out because she still hasn't received her MRI results. She wants someone to call her today!  [phone]

## 2015-06-06 NOTE — Telephone Encounter (Signed)
Advised patient of MRI results; reassured of benign subcutaneous lymph node.  Not consistent with cyst. No further action warranted unless area enlarges.

## 2015-06-16 ENCOUNTER — Ambulatory Visit (INDEPENDENT_AMBULATORY_CARE_PROVIDER_SITE_OTHER): Payer: Medicare Other | Admitting: Family Medicine

## 2015-06-16 ENCOUNTER — Encounter: Payer: Self-pay | Admitting: Family Medicine

## 2015-06-16 ENCOUNTER — Telehealth: Payer: Self-pay

## 2015-06-16 VITALS — BP 110/70 | HR 83 | Temp 98.2°F | Resp 18 | Ht 68.0 in | Wt 133.0 lb

## 2015-06-16 DIAGNOSIS — G43009 Migraine without aura, not intractable, without status migrainosus: Secondary | ICD-10-CM

## 2015-06-16 DIAGNOSIS — G43809 Other migraine, not intractable, without status migrainosus: Secondary | ICD-10-CM

## 2015-06-16 MED ORDER — IBUPROFEN 600 MG PO TABS: 600.0000 mg | ORAL_TABLET | Freq: Four times a day (QID) | ORAL | Status: DC | PRN

## 2015-06-16 MED ORDER — HYDROCODONE-ACETAMINOPHEN 5-325 MG PO TABS: 1.0000 | ORAL_TABLET | Freq: Two times a day (BID) | ORAL | Status: DC | PRN
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
## 2015-06-16 NOTE — Patient Instructions (Signed)
     IF you received an x-ray today, you will receive an invoice from [facility]. Please contact [facility] at [phone] with questions or concerns regarding your invoice.   IF you received labwork today, you will receive an invoice from [facility]. Please contact [facility] at [phone] with questions or concerns regarding your invoice.   Our billing staff will not be able to assist you with questions regarding bills from these companies.  You will be contacted with the lab results as soon as they are available. The fastest way to get your results is to activate your My Chart account. Instructions are located on the last page of this paperwork. If you have not heard from us regarding the results in 2 weeks, please contact this office.      

## 2015-06-16 NOTE — Telephone Encounter (Signed)
PT IS REQUESTING A REFILL OF HYDROcodone-acetaminophen (NORCO/VICODIN) 5-325 MG tablet FOR HER MIGRAINES. PT UNDERSTANDS THAT SHE MAY NEED TO [facility] IN ORDER TO GET THIS.

## 2015-06-17 NOTE — Telephone Encounter (Signed)
Pt was here in office 06/16/15

## 2015-06-18 ENCOUNTER — Telehealth: Payer: Self-pay | Admitting: Family Medicine

## 2015-06-18 MED ORDER — HYDROCODONE-ACETAMINOPHEN 5-325 MG PO TABS: 1.0000 | ORAL_TABLET | ORAL | Status: DC | PRN

## 2015-06-18 NOTE — Telephone Encounter (Signed)
Advised pt of last msg// pt will come in for pick up-- KJR
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
## 2015-06-19 ENCOUNTER — Telehealth: Payer: Self-pay

## 2015-06-19 NOTE — Telephone Encounter (Signed)
Pt is needing to talk with dr Brigitte Pulse about her medication she wrote yesterday I believe she is having trouble getting it filled  Best number [phone]

## 2015-06-20 NOTE — Telephone Encounter (Signed)
Left message for pt to call back  °

## 2015-06-21 ENCOUNTER — Telehealth: Payer: Self-pay

## 2015-06-21 NOTE — Telephone Encounter (Signed)
i think pt was going to schedule an appt at [facility] as well so hopefully she can be seen there and then just keep the [facility] appt if still needed.  Please check with pt and see when her appt is at [facility] - I know sometimes it takes a while to get in there as well.  If appt at [facility] is more than a mo away, consider trying [place] with Dr. Domingo Cocking instead or if pt has any thoughts that's fine as well.

## 2015-06-21 NOTE — Telephone Encounter (Signed)
Dr. Brigitte Pulse the [facility] is booked out until May. She said they can place patient on a cancellation list if anyone cancels but that's all they can do. I scheduled patient's appt anyway which is 08/30/2015 @ 9:00am with an 8:45am check in.  Would you like to recommend somewhere else? You said two weeks but with this clinic that just isn't possible.

## 2015-06-24 ENCOUNTER — Encounter: Payer: Self-pay | Admitting: Emergency Medicine

## 2015-06-24 ENCOUNTER — Emergency Department
Admission: EM | Admit: 2015-06-24 | Discharge: 2015-06-25 | Disposition: A | Discharge status: Home / Self Care | Payer: Medicare Other | Attending: Emergency Medicine | Admitting: Emergency Medicine

## 2015-06-24 DIAGNOSIS — F32A Depression, unspecified: Secondary | ICD-10-CM

## 2015-06-24 DIAGNOSIS — F1410 Cocaine abuse, uncomplicated: Secondary | ICD-10-CM | POA: Diagnosis not present

## 2015-06-24 DIAGNOSIS — F17210 Nicotine dependence, cigarettes, uncomplicated: Secondary | ICD-10-CM | POA: Diagnosis not present

## 2015-06-24 DIAGNOSIS — F329 Major depressive disorder, single episode, unspecified: Secondary | ICD-10-CM | POA: Insufficient documentation

## 2015-06-24 DIAGNOSIS — Z008 Encounter for other general examination: Secondary | ICD-10-CM | POA: Diagnosis present

## 2015-06-24 DIAGNOSIS — Z3202 Encounter for pregnancy test, result negative: Secondary | ICD-10-CM | POA: Insufficient documentation

## 2015-06-24 DIAGNOSIS — F1310 Sedative, hypnotic or anxiolytic abuse, uncomplicated: Secondary | ICD-10-CM | POA: Insufficient documentation

## 2015-06-24 DIAGNOSIS — Z79899 Other long term (current) drug therapy: Secondary | ICD-10-CM | POA: Diagnosis not present

## 2015-06-24 DIAGNOSIS — F3160 Bipolar disorder, current episode mixed, unspecified: Secondary | ICD-10-CM | POA: Diagnosis not present

## 2015-06-24 DIAGNOSIS — Z880 Allergy status to penicillin: Secondary | ICD-10-CM | POA: Insufficient documentation

## 2015-06-24 HISTORY — DX: Neoplasm of unspecified behavior of endocrine glands and other parts of nervous system: D49.7

## 2015-06-24 LAB — COMPREHENSIVE METABOLIC PANEL
ALT: 23 U/L (ref 14–54)
AST: 22 U/L (ref 15–41)
Albumin: 4.5 g/dL (ref 3.5–5.0)
Alkaline Phosphatase: 53 U/L (ref 38–126)
Anion gap: 6 (ref 5–15)
BUN: 16 mg/dL (ref 6–20)
CHLORIDE: 104 mmol/L (ref 101–111)
CO2: 28 mmol/L (ref 22–32)
CREATININE: 0.77 mg/dL (ref 0.44–1.00)
Calcium: 9.4 mg/dL (ref 8.9–10.3)
Glucose, Bld: 133 mg/dL — ABNORMAL HIGH (ref 65–99)
Potassium: 3.7 mmol/L (ref 3.5–5.1)
Sodium: 138 mmol/L (ref 135–145)
Total Bilirubin: 0.4 mg/dL (ref 0.3–1.2)
Total Protein: 7.3 g/dL (ref 6.5–8.1)

## 2015-06-24 LAB — SALICYLATE LEVEL

## 2015-06-24 LAB — URINE DRUG SCREEN, QUALITATIVE (ARMC ONLY)
Amphetamines, Ur Screen: NOT DETECTED
BENZODIAZEPINE, UR SCRN: POSITIVE — AB
Barbiturates, Ur Screen: NOT DETECTED
CANNABINOID 50 NG, UR ~~LOC~~: NOT DETECTED
Cocaine Metabolite,Ur ~~LOC~~: POSITIVE — AB
MDMA (Ecstasy)Ur Screen: NOT DETECTED
Methadone Scn, Ur: NOT DETECTED
Opiate, Ur Screen: NOT DETECTED
Phencyclidine (PCP) Ur S: NOT DETECTED
TRICYCLIC, UR SCREEN: NOT DETECTED

## 2015-06-24 LAB — CBC
HCT: 44.4 % (ref 35.0–47.0)
Hemoglobin: 15.5 g/dL (ref 12.0–16.0)
MCH: 33.3 pg (ref 26.0–34.0)
MCHC: 35 g/dL (ref 32.0–36.0)
MCV: 95.1 fL (ref 80.0–100.0)
PLATELETS: 198 10*3/uL (ref 150–440)
RBC: 4.66 MIL/uL (ref 3.80–5.20)
RDW: 13.2 % (ref 11.5–14.5)
WBC: 6.4 10*3/uL (ref 3.6–11.0)

## 2015-06-24 LAB — ACETAMINOPHEN LEVEL: Acetaminophen (Tylenol), Serum: <10 ug/mL — ABNORMAL LOW (ref 10–30)

## 2015-06-24 LAB — PREGNANCY, URINE: PREG TEST UR: NEGATIVE

## 2015-06-24 LAB — ETHANOL

## 2015-06-24 MED ORDER — IBUPROFEN 800 MG PO TABS
400.0000 mg | ORAL_TABLET | Freq: Three times a day (TID) | ORAL | Status: DC | PRN
  Filled 2015-06-24: qty 1

## 2015-06-24 MED ORDER — NICOTINE 14 MG/24HR TD PT24
14.0000 mg | MEDICATED_PATCH | Freq: Every day | TRANSDERMAL | Status: DC
  Filled 2015-06-24: qty 1

## 2015-06-24 NOTE — ED Notes (Signed)
Patient reports history of manic/depressive disorder.  Patient states recent manic episode that she states has now resolved but that she is feeling very depressed and wished to seek help with.

## 2015-06-24 NOTE — ED Notes (Signed)
Pt given third snack of the night-saltines, peanut butter, applesauce and ginger ale; pt is vegetarian "for 30 years for religious reasons"; concerned about what will be provided for breakfast
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
## 2015-06-24 NOTE — ED Notes (Signed)
Pt states "i'm depressed and i need to get in your pyschiatric area." pt denies SI or HI, states "i need to see someone, i know i need help."

## 2015-06-24 NOTE — ED Notes (Signed)
Patient requesting that items be itemized.  Patient belongings logged in presence of patient, this RN and [facility] [place].  1 pair black colored shoes 2 bras 1 pair of underwear 1 pink and white colored bath robe 1 tan and black colored sweater 1 pair tan corduroy pants 1 [employer] Bible 1 Yoga 28 Day Exercise book 2 [employer] lip gloss 1 Blue canvass bag  Patient also had credit cards, key fob, necklac, ring,cellphone/with case and charge locked in hospital safe.

## 2015-06-24 NOTE — ED Notes (Signed)
Out to triage to speak with pt; explained procedures and protocols regarding her presenting complaint; pt to come to room 23 when blood and urine collection complete and she's changed out into scrubs; pt verbalized understanding of same; also spoke with pt's step mother who was taken to triage room to speak with pt before leaving; step mother Silva Bandy provided with ED behavioral pt restrictions, dept phone number and visiting hours; she also verbalized understanding; step mother has left and pt will be brought to treatment room by ED when triage complete; pt awake, alert and talkative in triage room

## 2015-06-24 NOTE — ED Notes (Signed)
meds and list sent to pharmacy earlier; copy returned with key #[id number] which will be placed in pyxis;
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
## 2015-06-24 NOTE — ED Notes (Signed)
MD at bedside. 

## 2015-06-24 NOTE — ED Notes (Signed)
Pt out in hallway; says becoming more depressed the longer she is here and concerned she won't get an admission bed; understands Varney Biles is coming to assess her ASAP; pt ambulatory with steady gait

## 2015-06-24 NOTE — ED Notes (Signed)
TTS Keisha at bedside to assess

## 2015-06-25 DIAGNOSIS — F3110 Bipolar disorder, current episode manic without psychotic features, unspecified: Secondary | ICD-10-CM | POA: Diagnosis not present

## 2015-06-25 DIAGNOSIS — F3160 Bipolar disorder, current episode mixed, unspecified: Secondary | ICD-10-CM | POA: Diagnosis not present

## 2015-06-25 DIAGNOSIS — F329 Major depressive disorder, single episode, unspecified: Secondary | ICD-10-CM | POA: Diagnosis not present

## 2015-06-25 DIAGNOSIS — F4310 Post-traumatic stress disorder, unspecified: Secondary | ICD-10-CM | POA: Diagnosis not present

## 2015-06-25 MED ORDER — NICOTINE 21 MG/24HR TD PT24
MEDICATED_PATCH | TRANSDERMAL | Status: AC
  Administered 2015-06-25: 21 mg
  Filled 2015-06-25: qty 1

## 2015-06-25 MED ORDER — IBUPROFEN 800 MG PO TABS
800.0000 mg | ORAL_TABLET | Freq: Once | ORAL | Status: AC
  Administered 2015-06-25: 800 mg via ORAL

## 2015-06-25 MED ORDER — CLONAZEPAM 1 MG PO TABS
2.0000 mg | ORAL_TABLET | Freq: Once | ORAL | Status: AC
  Administered 2015-06-25: 2 mg via ORAL

## 2015-06-25 MED ORDER — CLONAZEPAM 1 MG PO TABS
ORAL_TABLET | ORAL | Status: AC
  Filled 2015-06-25: qty 2

## 2015-06-25 NOTE — ED Provider Notes (Signed)
-----------------------------------------   7:01 AM on 06/25/2015 -----------------------------------------   Blood pressure 117/75, pulse 82, temperature 98.1 F (36.7 C), temperature source Oral, resp. rate 16, height 5\' 8"  (1.727 m), weight 132 lb (59.875 kg), last menstrual period 11/21/2014, SpO2 97 %.  The patient had no acute events since last update.  Calm and cooperative at this time.    Patient waiting to be seen by psych, patient required 800 mg of ibuprofen as well as 2 mg of Klonopin for sleep overnight.     Loney Hering, MD 06/25/15 [phone]
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
## 2015-06-25 NOTE — ED Notes (Signed)
Patient denies SI or HI. She states her mood is "depressed." No evidence of gross psychotic thinking.  Vegan diet obtained for patient.  Maintained on 15 minute checks and observation by security camera for safety.

## 2015-06-25 NOTE — ED Notes (Signed)
Patient in dayroom, in no apparent distress. Lunch served. Patient got vegan diet as requested.

## 2015-06-25 NOTE — ED Provider Notes (Signed)
Dr. Gretel Acre, psychiatrist saw and evaluated this patient. She is being recommended for outpatient follow-up. Patient will be referred to [facility] for mental health evaluation and follow-up.  Lisa Roca, MD 06/25/15 1137

## 2015-06-25 NOTE — ED Notes (Signed)

## 2015-06-25 NOTE — Discharge Instructions (Signed)
Return to the emergency department for any worsening depression, or any thoughts of wanting to hurt yourself or others. You are recommended to follow up with mental health evaluation at [facility].   Major Depressive Disorder Major depressive disorder is a mental illness. It also may be called clinical depression or unipolar depression. Major depressive disorder usually causes feelings of sadness, hopelessness, or helplessness. Some people with this disorder do not feel particularly sad but lose interest in doing things they used to enjoy (anhedonia). Major depressive disorder also can cause physical symptoms. It can interfere with work, school, relationships, and other normal everyday activities. The disorder varies in severity but is longer lasting and more serious than the sadness we all feel from time to time in our lives. Major depressive disorder often is triggered by stressful life events or major life changes. Examples of these triggers include divorce, loss of your job or home, a move, and the death of a family member or close friend. Sometimes this disorder occurs for no obvious reason at all. People who have family members with major depressive disorder or bipolar disorder are at higher risk for developing this disorder, with or without life stressors. Major depressive disorder can occur at any age. It may occur just once in your life (single episode major depressive disorder). It may occur multiple times (recurrent major depressive disorder). SYMPTOMS People with major depressive disorder have either anhedonia or depressed mood on nearly a daily basis for at least 2 weeks or longer. Symptoms of depressed mood include:  Feelings of sadness (blue or down in the dumps) or emptiness.  Feelings of hopelessness or helplessness.  Tearfulness or episodes of crying (may be observed by others).  Irritability (children and adolescents). In addition to depressed mood or anhedonia or both, people with this  disorder have at least four of the following symptoms:  Difficulty sleeping or sleeping too much.   Significant change (increase or decrease) in appetite or weight.   Lack of energy or motivation.  Feelings of guilt and worthlessness.   Difficulty concentrating, remembering, or making decisions.  Unusually slow movement (psychomotor retardation) or restlessness (as observed by others).   Recurrent wishes for death, recurrent thoughts of self-harm (suicide), or a suicide attempt. People with major depressive disorder commonly have persistent negative thoughts about themselves, other people, and the world. People with severe major depressive disorder may experiencedistorted beliefs or perceptions about the world (psychotic delusions). They also may see or hear things that are not real (psychotic hallucinations). DIAGNOSIS Major depressive disorder is diagnosed through an assessment by your health care provider. Your health care provider will ask aboutaspects of your daily life, such as mood,sleep, and appetite, to see if you have the diagnostic symptoms of major depressive disorder. Your health care provider may ask about your medical history and use of alcohol or drugs, including prescription medicines. Your health care provider also may do a physical exam and blood work. This is because certain medical conditions and the use of certain substances can cause major depressive disorder-like symptoms (secondary depression). Your health care provider also may refer you to a mental health specialist for further evaluation and treatment. TREATMENT It is important to recognize the symptoms of major depressive disorder and seek treatment. The following treatments can be prescribed for this disorder:   Medicine. Antidepressant medicines usually are prescribed. Antidepressant medicines are thought to correct chemical imbalances in the brain that are commonly associated with major depressive  disorder. Other types of medicine may be  added if the symptoms do not respond to antidepressant medicines alone or if psychotic delusions or hallucinations occur. °· Talk therapy. Talk therapy can be helpful in treating major depressive disorder by providing support, education, and guidance. Certain types of talk therapy also can help with negative thinking (cognitive behavioral therapy) and with relationship issues that trigger this disorder (interpersonal therapy). °A mental health specialist can help determine which treatment is best for you. Most people with major depressive disorder do well with a combination of medicine and talk therapy. Treatments involving electrical stimulation of the brain can be used in situations with extremely severe symptoms or when medicine and talk therapy do not work over time. These treatments include electroconvulsive therapy, transcranial magnetic stimulation, and vagal nerve stimulation. °  °This information is not intended to replace advice given to you by your health care provider. Make sure you discuss any questions you have with your health care provider. °  °Document Released: 07/19/2012 Document Revised: 04/14/2014 Document Reviewed: 07/19/2012 °Elsevier Interactive Patient Education ©2016 [employer] ° °

## 2015-06-25 NOTE — ED Notes (Signed)
Patient moved to ED BHU room 6 without difficulty.  Patient oriented to unit, verbalized understanding.

## 2015-06-25 NOTE — ED Notes (Signed)
Patient discharged ambulatory to home. She denies SI or HI. Discharge instructions reviewed with patient, she verbalizes understanding. Patient received copy of DC instructions, all clothing and personal items, home medications, and valuables that were locked up with security. ** Patient was given clothing to wear for home. Another patient reported that [name] had a cigarette and matches in her clothes and went into the bathroom to smoke. Bathroom did smell of cigarette smoke. Bathroom searched  - nothing found.

## 2015-06-25 NOTE — ED Notes (Signed)
Patient requesting her Bible.  Bible retrieved from belongings and assessed for any contraband.  Bible cover wrapped in duck tape.  Bible given to patient.

## 2015-06-25 NOTE — ED Notes (Signed)
Pt given vegan lunch tray with soy milk.

## 2015-06-25 NOTE — ED Notes (Signed)
Patient awake, alert, and oriented. When awake, she immediately came to the nursing station with numerous vegan dietary requests, stating it was illegal to deny her proper nutrition. Patient was supplied with a list of vegan choices and was told staff would alert the dietary department regarding her special needs. Patient had difficulty responding to staff reassurance. She repeatedly stated she came to the hospital for "help" and we were not helping her. When asked what else we could assist her with, she did not have an answer.  Patient was encouraged to discuss specific issues with psychiatrist. She will remain on 15 minute checks for safety.

## 2015-06-25 NOTE — ED Notes (Addendum)
Patient met with MD. She is requesting to be discharged so she can go to another hospital, "things are not working out here."  Patient was told her stepmother had called earlier.  Will continue to support through disposition. All safety precautions maintained.

## 2015-07-05 DIAGNOSIS — F4310 Post-traumatic stress disorder, unspecified: Secondary | ICD-10-CM | POA: Diagnosis not present

## 2015-07-05 DIAGNOSIS — F3110 Bipolar disorder, current episode manic without psychotic features, unspecified: Secondary | ICD-10-CM | POA: Diagnosis not present

## 2015-07-09 ENCOUNTER — Ambulatory Visit
Admission: RE | Admit: 2015-07-09 | Discharge: 2015-07-09 | Disposition: A | Discharge status: Home / Self Care | Payer: Medicare Other | Source: Ambulatory Visit | Attending: General Surgery | Admitting: General Surgery

## 2015-07-09 DIAGNOSIS — N63 Unspecified lump in unspecified breast: Secondary | ICD-10-CM

## 2015-07-09 DIAGNOSIS — N6310 Unspecified lump in the right breast, unspecified quadrant: Secondary | ICD-10-CM

## 2015-07-09 DIAGNOSIS — N6011 Diffuse cystic mastopathy of right breast: Secondary | ICD-10-CM | POA: Diagnosis not present

## 2015-07-12 DIAGNOSIS — F4312 Post-traumatic stress disorder, chronic: Secondary | ICD-10-CM | POA: Diagnosis not present

## 2015-07-12 DIAGNOSIS — F3110 Bipolar disorder, current episode manic without psychotic features, unspecified: Secondary | ICD-10-CM | POA: Diagnosis not present

## 2015-07-12 DIAGNOSIS — F3112 Bipolar disorder, current episode manic without psychotic features, moderate: Secondary | ICD-10-CM | POA: Diagnosis not present

## 2015-07-12 DIAGNOSIS — F4310 Post-traumatic stress disorder, unspecified: Secondary | ICD-10-CM | POA: Diagnosis not present

## 2015-07-19 DIAGNOSIS — F4310 Post-traumatic stress disorder, unspecified: Secondary | ICD-10-CM | POA: Diagnosis not present

## 2015-07-19 DIAGNOSIS — F3110 Bipolar disorder, current episode manic without psychotic features, unspecified: Secondary | ICD-10-CM | POA: Diagnosis not present

## 2015-07-26 ENCOUNTER — Ambulatory Visit (INDEPENDENT_AMBULATORY_CARE_PROVIDER_SITE_OTHER): Payer: Medicare Other | Admitting: Family Medicine

## 2015-07-26 VITALS — BP 110/64 | HR 70 | Temp 97.4°F | Resp 18 | Ht 67.0 in | Wt 134.0 lb

## 2015-07-26 DIAGNOSIS — L0231 Cutaneous abscess of buttock: Secondary | ICD-10-CM

## 2015-07-26 DIAGNOSIS — F4310 Post-traumatic stress disorder, unspecified: Secondary | ICD-10-CM | POA: Diagnosis not present

## 2015-07-26 DIAGNOSIS — F3110 Bipolar disorder, current episode manic without psychotic features, unspecified: Secondary | ICD-10-CM | POA: Diagnosis not present

## 2015-07-26 DIAGNOSIS — N959 Unspecified menopausal and perimenopausal disorder: Secondary | ICD-10-CM

## 2015-07-26 MED ORDER — IBUPROFEN 800 MG PO TABS: 800.0000 mg | ORAL_TABLET | Freq: Three times a day (TID) | ORAL | Status: DC | PRN

## 2015-07-26 NOTE — Progress Notes (Signed)
Subjective:    Patient ID: Catherine Olsen, female    DOB: [birth year], 45 y.o.   MRN: [id number]  07/26/2015  Cyst   HPI This 45 y.o. female presents for evaluation of boil/cyst.  Onset two weeks ago; been present for months and got inflamed.  No fever/chills/sweats.  No drainage; no pustule.  No blisters.  Hot compresses and soaks.  Tried Retin-A.  Started HRT patch for menopausal symptoms; willing to take HRT despite risk with active smoking.  Mood is much more stable.  Sweating has improved.  Moodiness has acutely worsened prior to HRT; was near suicidal.   Review of Systems  Constitutional: Negative for fever, chills, diaphoresis and fatigue.  Gastrointestinal: Negative for nausea, vomiting, diarrhea and constipation.  Skin: Positive for color change and wound. Negative for pallor and rash.    Past Medical History  Diagnosis Date  . Asthma   . Anxiety   . Mental disorder   . Bipolar 1 disorder ([place])   . Endometriosis   . PONV (postoperative nausea and vomiting)   . Termination of pregnancy     x 2 at age 45 and 45 yrs old  . Depression   . Headache(784.0)     otc med prn  . Arthritis     hands, lower back, knees  . Fibromyalgia   . Scoliosis   . Scoliosis   . Allergy   . Pituitary tumor [facility])    Past Surgical History  Procedure Laterality Date  . Nose surgery      rhinoplasty at age 31 yrs  . Wisdom tooth extraction    . Facial cosmetic surgery      right cheek  . Laparoscopy Right 11/11/2013    Procedure: LAPAROSCOPY OPERATIVE with  Drainage of  RIGHT Ovarian ENDOMETRIOMA;  Surgeon: Elveria Royals, MD;  Location: [facility];  Service: Gynecology;  Laterality: Right;  . Dilation and curettage of uterus    . Breast surgery     Allergies  Allergen Reactions  . Sulfamethoxazole Anaphylaxis  . Oxycodone Other (See Comments)    Severe blindness?  . Penicillins Hives  . Other Anxiety    steroids  . Percocet [Oxycodone-Acetaminophen] Palpitations  . Sulfa  Antibiotics Hives  . Sulfites Other (See Comments)    unknown    Social History   Social History  . Marital Status: Divorced    Spouse Name: N/A  . Number of Children: 0  . Years of Education: N/A   Occupational History  . disability     mental illness   Social History Main Topics  . Smoking status: Current Every Day Smoker -- 1.00 packs/day for 29 years    Types: Cigarettes, E-cigarettes  . Smokeless tobacco: Never Used  . Alcohol Use: No     Comment: "rare"  . 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 Sexual Activity: No     Comment: abortion at 15 and 76yrs. of age    Other Topics Concern  . Not on file   Social History Narrative   Marital status: divorced; not dating      Children: none      Lives: alone with mom      Employment: disability for mental illness in 1997.  Hospitalizations x 9 in past.      Tobacco: 1 ppd x since age 19.      Alcohol: socially; beers.  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      Family History  Problem Relation Age of Onset  . Diabetes Mother   . Hypertension Mother   . Stroke Mother 57    CVA  . Mental illness Mother     no diagnosis; personality disorder  . Hyperlipidemia Father   . Hypertension Father   . COPD Father   . Alpha-1 antitrypsin deficiency Father   . Diabetes Maternal Grandmother   . Heart disease Maternal Grandmother   . Hyperlipidemia Maternal Grandmother   . Hypertension Maternal Grandmother   . Mental illness Maternal Grandmother   . Heart disease Maternal Grandfather   . Hyperlipidemia Maternal Grandfather   . Hypertension Maternal Grandfather   . Heart disease Paternal Grandfather   . Hyperlipidemia Paternal Grandfather   . Hypertension Paternal Grandfather   . Stroke Paternal Grandfather   . Alpha-1 antitrypsin deficiency Brother        Objective:    BP 110/64 mmHg  Pulse 70  Temp(Src) 97.4 F (36.3 C) (Oral)   Resp 18  Ht 5\' 7"  (1.702 m)  Wt 134 lb (60.782 kg)  BMI 20.98 kg/m2  SpO2 99%  LMP 11/21/2014 Physical Exam  Constitutional: She is oriented to person, place, and time. She appears well-developed and well-nourished. No distress.  HENT:  Head: Normocephalic and atraumatic.  Eyes: Conjunctivae are normal. Pupils are equal, round, and reactive to light.  Neck: Normal range of motion. Neck supple.  Cardiovascular: Normal rate, regular rhythm and normal heart sounds.  Exam reveals no gallop and no friction rub.   No murmur heard. Pulmonary/Chest: Effort normal and breath sounds normal. She has no wheezes. She has no rales.  Neurological: She is alert and oriented to person, place, and time.  Skin: Skin is warm and dry. She is not diaphoretic. There is erythema.  Buttocks lesion 1.5cm diameter with induration, fluctuance, tenderness; +central pustule present. Induration surrounded 2 cm.  Psychiatric: She has a normal mood and affect. Her behavior is normal.  Nursing note and vitals reviewed.  Results for orders placed or performed during the hospital encounter of 06/24/15  Comprehensive metabolic panel  Result Value Ref Range   Sodium 138 135 - 145 mmol/L   Potassium 3.7 3.5 - 5.1 mmol/L   Chloride 104 101 - 111 mmol/L   CO2 28 22 - 32 mmol/L   Glucose, Bld 133 (H) 65 - 99 mg/dL   BUN 16 6 - 20 mg/dL   Creatinine, Ser 0.77 0.44 - 1.00 mg/dL   Calcium 9.4 8.9 - 10.3 mg/dL   Total Protein 7.3 6.5 - 8.1 g/dL   Albumin 4.5 3.5 - 5.0 g/dL   AST 22 15 - 41 U/L   ALT 23 14 - 54 U/L   Alkaline Phosphatase 53 38 - 126 U/L   Total Bilirubin 0.4 0.3 - 1.2 mg/dL   GFR calc non Af Amer >60 >60 mL/min   GFR calc Af Amer >60 >60 mL/min   Anion gap 6 5 - 15  Ethanol (ETOH)  Result Value Ref Range   Alcohol, Ethyl (B) <5 <5 mg/dL  Salicylate level  Result Value Ref Range   Salicylate Lvl 123456 2.8 - 30.0 mg/dL  Acetaminophen level  Result Value Ref Range   Acetaminophen (Tylenol), Serum <10  (L) 10 - 30 ug/mL  CBC  Result Value Ref Range   WBC 6.4 3.6 - 11.0 K/uL   RBC 4.66 3.80 - 5.20 MIL/uL  Hemoglobin 15.5 12.0 - 16.0 g/dL   HCT 44.4 35.0 - 47.0 %   MCV 95.1 80.0 - 100.0 fL   MCH 33.3 26.0 - 34.0 pg   MCHC 35.0 32.0 - 36.0 g/dL   RDW 13.2 11.5 - 14.5 %   Platelets 198 150 - 440 K/uL  Urine Drug Screen, Qualitative ([facility] only)  Result Value Ref Range   Tricyclic, Ur Screen NONE DETECTED NONE DETECTED   Amphetamines, Ur Screen NONE DETECTED NONE DETECTED   MDMA (Ecstasy)Ur Screen NONE DETECTED NONE DETECTED   Cocaine Metabolite,Ur [place] POSITIVE (A) NONE DETECTED   Opiate, Ur Screen NONE DETECTED NONE DETECTED   Phencyclidine (PCP) Ur S NONE DETECTED NONE DETECTED   Cannabinoid 50 Ng, Ur [place] NONE DETECTED NONE DETECTED   Barbiturates, Ur Screen NONE DETECTED NONE DETECTED   Benzodiazepine, Ur Scrn POSITIVE (A) NONE DETECTED   Methadone Scn, Ur NONE DETECTED NONE DETECTED  Pregnancy, urine  Result Value Ref Range   Preg Test, Ur NEGATIVE NEGATIVE   PROCEDURE NOTE: VERBAL CONSENT OBTAINED; 1 CC LIDOCAINE 1% WITH EPI ADMINISTERED INTO WOUND; STERILE PREP.  STERILE 11 BLADE ADVANCED INTO WOUND 6 MM.  MODERATE AMOUNT OF GRAY-WHITE-BLOODY DRAINAGE EXPRESSED; WOUND CULTURE OBTAINED. PT DECLINED PACKING; BANDAGE APPLIED.    Assessment & Plan:   1. Abscess of buttock    -New. -s/p simple I&D in office. -send wound cx. -local wound care. -pt declined oral abx and agreeable due to mild erythema. -RTC for worsening, increasing pain.   Orders Placed This Encounter  Procedures  . Wound culture    Order Specific Question:  Source    Answer:  buttocks   Meds ordered this encounter  Medications  . ibuprofen (ADVIL,MOTRIN) 800 MG tablet    Sig: Take 1 tablet (800 mg total) by mouth every 8 (eight) hours as needed.    Dispense:  40 tablet    Refill:  0    No Follow-up on file.    Chloris Marcoux Elayne Guerin, M.D. [facility] [address] [place], [place]  [address] [phone] phone [phone] fax

## 2015-07-26 NOTE — Patient Instructions (Addendum)
IF you received an x-ray today, you will receive an invoice from [facility]. Please contact [facility] at [phone] with questions or concerns regarding your invoice.   IF you received labwork today, you will receive an invoice from [employer]. Please contact [facility] at [phone] with questions or concerns regarding your invoice.   Our billing staff will not be able to assist you with questions regarding bills from these companies.  You will be contacted with the lab results as soon as they are available. The fastest way to get your results is to activate your My Chart account. Instructions are located on the last page of this paperwork. If you have not heard from [place] regarding the results in 2 weeks, please contact this office.    Abscess An abscess is an infected area that contains a collection of pus and debris.It can occur in almost any part of the body. An abscess is also known as a furuncle or boil. CAUSES  An abscess occurs when tissue gets infected. This can occur from blockage of oil or sweat glands, infection of hair follicles, or a minor injury to the skin. As the body tries to fight the infection, pus collects in the area and creates pressure under the skin. This pressure causes pain. People with weakened immune systems have difficulty fighting infections and get certain abscesses more often.  SYMPTOMS Usually an abscess develops on the skin and becomes a painful mass that is red, warm, and tender. If the abscess forms under the skin, you may feel a moveable soft area under the skin. Some abscesses break open (rupture) on their own, but most will continue to get worse without care. The infection can spread deeper into the body and eventually into the bloodstream, causing you to feel ill.  DIAGNOSIS  Your caregiver will take your medical history and perform a physical exam. A sample of fluid may also be taken from the abscess to  determine what is causing your infection. TREATMENT  Your caregiver may prescribe antibiotic medicines to fight the infection. However, taking antibiotics alone usually does not cure an abscess. Your caregiver may need to make a small cut (incision) in the abscess to drain the pus. In some cases, gauze is packed into the abscess to reduce pain and to continue draining the area. HOME CARE INSTRUCTIONS   Only take over-the-counter or prescription medicines for pain, discomfort, or fever as directed by your caregiver.  If you were prescribed antibiotics, take them as directed. Finish them even if you start to feel better.  If gauze is used, follow your caregiver's directions for changing the gauze.  To avoid spreading the infection:  Keep your draining abscess covered with a bandage.  Wash your hands well.  Do not share personal care items, towels, or whirlpools with others.  Avoid skin contact with others.  Keep your skin and clothes clean around the abscess.  Keep all follow-up appointments as directed by your caregiver. SEEK MEDICAL CARE IF:   You have increased pain, swelling, redness, fluid drainage, or bleeding.  You have muscle aches, chills, or a general ill feeling.  You have a fever. MAKE SURE YOU:   Understand these instructions.  Will watch your condition.  Will get help right away if you are not doing well or get worse.   This information is not intended to replace advice given to you by your health care provider. Make sure you discuss any questions you have with  your health care provider.   Document Released: 01/01/2005 Document Revised: 09/23/2011 Document Reviewed: 06/06/2011 Elsevier Interactive Patient Education [employer].

## 2015-07-28 LAB — WOUND CULTURE
Gram Stain: NONE SEEN
Organism ID, Bacteria: NORMAL

## 2015-08-02 DIAGNOSIS — F3110 Bipolar disorder, current episode manic without psychotic features, unspecified: Secondary | ICD-10-CM | POA: Diagnosis not present

## 2015-08-02 DIAGNOSIS — F4310 Post-traumatic stress disorder, unspecified: Secondary | ICD-10-CM | POA: Diagnosis not present

## 2015-08-05 ENCOUNTER — Encounter (HOSPITAL_COMMUNITY): Payer: Self-pay | Admitting: *Deleted

## 2015-08-05 ENCOUNTER — Emergency Department (HOSPITAL_COMMUNITY)
Admission: EM | Admit: 2015-08-05 | Discharge: 2015-08-06 | Disposition: A | Discharge status: Other Acute Inpatient Hospital | Payer: Medicare Other | Attending: Emergency Medicine | Admitting: Emergency Medicine

## 2015-08-05 DIAGNOSIS — M797 Fibromyalgia: Secondary | ICD-10-CM | POA: Insufficient documentation

## 2015-08-05 DIAGNOSIS — F4310 Post-traumatic stress disorder, unspecified: Secondary | ICD-10-CM | POA: Diagnosis not present

## 2015-08-05 DIAGNOSIS — Z791 Long term (current) use of non-steroidal anti-inflammatories (NSAID): Secondary | ICD-10-CM | POA: Insufficient documentation

## 2015-08-05 DIAGNOSIS — M179 Osteoarthritis of knee, unspecified: Secondary | ICD-10-CM | POA: Insufficient documentation

## 2015-08-05 DIAGNOSIS — M4696 Unspecified inflammatory spondylopathy, lumbar region: Secondary | ICD-10-CM | POA: Insufficient documentation

## 2015-08-05 DIAGNOSIS — J45909 Unspecified asthma, uncomplicated: Secondary | ICD-10-CM | POA: Insufficient documentation

## 2015-08-05 DIAGNOSIS — Z79899 Other long term (current) drug therapy: Secondary | ICD-10-CM | POA: Insufficient documentation

## 2015-08-05 DIAGNOSIS — Z79891 Long term (current) use of opiate analgesic: Secondary | ICD-10-CM | POA: Diagnosis not present

## 2015-08-05 DIAGNOSIS — M419 Scoliosis, unspecified: Secondary | ICD-10-CM | POA: Diagnosis not present

## 2015-08-05 DIAGNOSIS — F319 Bipolar disorder, unspecified: Secondary | ICD-10-CM | POA: Insufficient documentation

## 2015-08-05 DIAGNOSIS — Z8589 Personal history of malignant neoplasm of other organs and systems: Secondary | ICD-10-CM | POA: Insufficient documentation

## 2015-08-05 DIAGNOSIS — G4489 Other headache syndrome: Secondary | ICD-10-CM | POA: Insufficient documentation

## 2015-08-05 DIAGNOSIS — F3175 Bipolar disorder, in partial remission, most recent episode depressed: Secondary | ICD-10-CM | POA: Diagnosis not present

## 2015-08-05 DIAGNOSIS — F17210 Nicotine dependence, cigarettes, uncomplicated: Secondary | ICD-10-CM | POA: Insufficient documentation

## 2015-08-05 DIAGNOSIS — F3130 Bipolar disorder, current episode depressed, mild or moderate severity, unspecified: Secondary | ICD-10-CM | POA: Diagnosis present

## 2015-08-05 DIAGNOSIS — M19049 Primary osteoarthritis, unspecified hand: Secondary | ICD-10-CM | POA: Diagnosis not present

## 2015-08-05 DIAGNOSIS — Z7951 Long term (current) use of inhaled steroids: Secondary | ICD-10-CM | POA: Insufficient documentation

## 2015-08-05 DIAGNOSIS — R45851 Suicidal ideations: Secondary | ICD-10-CM | POA: Diagnosis not present

## 2015-08-05 HISTORY — DX: Post-traumatic stress disorder, unspecified: F43.10

## 2015-08-05 LAB — CBC
HEMATOCRIT: 43.1 % (ref 36.0–46.0)
Hemoglobin: 15 g/dL (ref 12.0–15.0)
MCH: 32.8 pg (ref 26.0–34.0)
MCHC: 34.8 g/dL (ref 30.0–36.0)
MCV: 94.1 fL (ref 78.0–100.0)
Platelets: 196 10*3/uL (ref 150–400)
RBC: 4.58 MIL/uL (ref 3.87–5.11)
RDW: 13.4 % (ref 11.5–15.5)
WBC: 6.9 10*3/uL (ref 4.0–10.5)

## 2015-08-05 LAB — COMPREHENSIVE METABOLIC PANEL
ALK PHOS: 56 U/L (ref 38–126)
ALT: 25 U/L (ref 14–54)
AST: 19 U/L (ref 15–41)
Albumin: 4.5 g/dL (ref 3.5–5.0)
Anion gap: 8 (ref 5–15)
BUN: 13 mg/dL (ref 6–20)
CO2: 25 mmol/L (ref 22–32)
CREATININE: 0.57 mg/dL (ref 0.44–1.00)
Calcium: 9.3 mg/dL (ref 8.9–10.3)
Chloride: 107 mmol/L (ref 101–111)
GFR calc Af Amer: >60 mL/min (ref >60)
GLUCOSE: 104 mg/dL — AB (ref 65–99)
Potassium: 4.4 mmol/L (ref 3.5–5.1)
Sodium: 140 mmol/L (ref 135–145)
TOTAL PROTEIN: 7.1 g/dL (ref 6.5–8.1)
Total Bilirubin: 0.6 mg/dL (ref 0.3–1.2)

## 2015-08-05 LAB — RAPID URINE DRUG SCREEN, HOSP PERFORMED
Amphetamines: NOT DETECTED
BARBITURATES: NOT DETECTED
Benzodiazepines: POSITIVE — AB
COCAINE: NOT DETECTED
Opiates: NOT DETECTED
TETRAHYDROCANNABINOL: NOT DETECTED

## 2015-08-05 LAB — ETHANOL

## 2015-08-05 MED ORDER — NICOTINE 21 MG/24HR TD PT24
21.0000 mg | MEDICATED_PATCH | Freq: Every day | TRANSDERMAL | Status: DC
  Administered 2015-08-05: 21 mg via TRANSDERMAL
  Filled 2015-08-05: qty 1

## 2015-08-05 MED ORDER — ARIPIPRAZOLE 5 MG PO TABS
5.0000 mg | ORAL_TABLET | Freq: Every evening | ORAL | Status: DC
  Filled 2015-08-05: qty 1

## 2015-08-05 MED ORDER — ALBUTEROL SULFATE HFA 108 (90 BASE) MCG/ACT IN AERS: 2.0000 | INHALATION_SPRAY | Freq: Four times a day (QID) | RESPIRATORY_TRACT | Status: DC | PRN

## 2015-08-05 MED ORDER — ALPRAZOLAM 1 MG PO TABS
1.0000 mg | ORAL_TABLET | Freq: Two times a day (BID) | ORAL | Status: DC
  Administered 2015-08-05 (×2): 1 mg via ORAL
  Filled 2015-08-05 (×2): qty 1

## 2015-08-05 MED ORDER — ESTRADIOL 0.0375 MG/24HR TD PTWK: 0.0375 mg | MEDICATED_PATCH | TRANSDERMAL | Status: DC

## 2015-08-05 MED ORDER — ARIPIPRAZOLE 5 MG PO TABS
5.0000 mg | ORAL_TABLET | Freq: Every evening | ORAL | Status: DC
  Administered 2015-08-05: 5 mg via ORAL

## 2015-08-05 NOTE — ED Notes (Signed)
TTS speaking with pt via webcam 

## 2015-08-05 NOTE — ED Notes (Addendum)
Report received from [place]. Patient alert and oriented, warm and dry, in no acute distress. Patient denies HI, AVH and pain. Patient states she is still having SI to hang herself. Patient made aware of Q15 minute rounds and security cameras for their safety. Patient instructed to come to me with needs or concerns.

## 2015-08-05 NOTE — ED Notes (Signed)
Pt reports that she can not take the generic brand of ability (causes neurlogic problems) pharmacy contacted and they do not have brand name.  Pt has her own medication with her.

## 2015-08-05 NOTE — ED Provider Notes (Addendum)
CSN: [id number]     Arrival date & time 08/05/15  0917 History   First MD Initiated Contact with Patient 08/05/15 1228     Chief Complaint  Patient presents with  . Suicidal   HPI Patient presents to the emergency room for evaluation of suicidal ideation. Patient has a history of anxiety, bipolar disorder and PTSD. Patient has been having some chronic issues with the feeling that people are watching her and she is under surveillance. Patient admits she in the past associated with people who were breaking the law. Patient is worried that she may be under surveillance by the police now. Patient however also recognizes that a lot of these symptoms may be related to her anxiety and mental disorder. The patient has been seeing her psychiatrist, Dr. [name].  She has been compliant with her treatment however symptoms are escalating. Patient is feeling suicidal. She has thought about the different methods of attempting suicide and thinks the " best way to accomplish that would be to hang herself. " She states that overdose on pills often doesn't work. She does not have access to a firearm so she thinks that hanging herself would be the most effective method. Past Medical History  Diagnosis Date  . Asthma   . Anxiety   . Mental disorder   . Bipolar 1 disorder ([place])   . Endometriosis   . PONV (postoperative nausea and vomiting)   . Termination of pregnancy     x 2 at age 28 and 45 yrs old  . Depression   . Headache(784.0)     otc med prn  . Arthritis     hands, lower back, knees  . Fibromyalgia   . Scoliosis   . Scoliosis   . Allergy   . Pituitary tumor ([place])   . PTSD (post-traumatic stress disorder)    Past Surgical History  Procedure Laterality Date  . Nose surgery      rhinoplasty at age 13 yrs  . Wisdom tooth extraction    . Facial cosmetic surgery      right cheek  . Laparoscopy Right 11/11/2013    Procedure: LAPAROSCOPY OPERATIVE with  Drainage of  RIGHT Ovarian ENDOMETRIOMA;  Surgeon:  Elveria Royals, MD;  Location: [facility];  Service: Gynecology;  Laterality: Right;  . Dilation and curettage of uterus    . Breast surgery     Family History  Problem Relation Age of Onset  . Diabetes Mother   . Hypertension Mother   . Stroke Mother 63    CVA  . Mental illness Mother     no diagnosis; personality disorder  . Hyperlipidemia Father   . Hypertension Father   . COPD Father   . Alpha-1 antitrypsin deficiency Father   . Diabetes Maternal Grandmother   . Heart disease Maternal Grandmother   . Hyperlipidemia Maternal Grandmother   . Hypertension Maternal Grandmother   . Mental illness Maternal Grandmother   . Heart disease Maternal Grandfather   . Hyperlipidemia Maternal Grandfather   . Hypertension Maternal Grandfather   . Heart disease Paternal Grandfather   . Hyperlipidemia Paternal Grandfather   . Hypertension Paternal Grandfather   . Stroke Paternal Grandfather   . Alpha-1 antitrypsin deficiency Brother    Social History  Substance Use Topics  . Smoking status: Current Every Day Smoker -- 1.00 packs/day for 29 years    Types: Cigarettes, E-cigarettes  . Smokeless tobacco: Never Used  . Alcohol Use: No     Comment: "  rare"   OB History    Gravida Para Term Preterm AB TAB SAB Ectopic Multiple Living   2    2 2     0     Review of Systems  All other systems reviewed and are negative.     Allergies  Sulfamethoxazole; Oxycodone; Abilify; Penicillins; Other; Percocet; Sulfa antibiotics; and Sulfites  Home Medications   Prior to Admission medications   Medication Sig Start Date End Date Taking? Authorizing Provider  albuterol (PROVENTIL HFA;VENTOLIN HFA) 108 (90 Base) MCG/ACT inhaler Inhale 2 puffs into the lungs every 6 (six) hours as needed for wheezing or shortness of breath (cough, shortness of breath or wheezing.). 04/18/15  Yes Adelina Mings, MD  alprazolam Duanne Moron) 2 MG tablet Take 1 mg by mouth 2 (two) times daily.   Yes Historical Provider,  MD  ARIPiprazole (ABILIFY) 5 MG tablet Take 5 mg by mouth every evening.   Yes Historical Provider, MD  Calcium Citrate (CITRACAL PO) Take 3 tablets by mouth daily.   Yes Historical Provider, MD  EPINEPHrine (EPIPEN 2-PAK) 0.3 mg/0.3 mL IJ SOAJ injection Inject 0.3 mLs (0.3 mg total) into the muscle once. 04/18/15  Yes Adelina Mings, MD  estradiol (CLIMARA - DOSED IN MG/24 HR) 0.0375 mg/24hr patch Place 0.0375 mg onto the skin 2 (two) times a week.   Yes Historical Provider, MD  ibuprofen (ADVIL,MOTRIN) 800 MG tablet Take 1 tablet (800 mg total) by mouth every 8 (eight) hours as needed. Patient taking differently: Take 800 mg by mouth every 8 (eight) hours as needed for headache or moderate pain.  07/26/15  Yes Wardell Honour, MD  MAGNESIUM CITRATE PO Take 500 mg by mouth daily.   Yes Historical Provider, MD  Multiple Vitamins-Minerals (MULTIVITAMIN ADULT PO) Take 1 tablet by mouth 3 (three) times a week.    Yes Historical Provider, MD  progesterone (PROMETRIUM) 200 MG capsule Take 1 capsule by mouth every 3 (three) months. x10 days to induce menses. 07/02/15  Yes Historical Provider, MD  beclomethasone (QVAR) 40 MCG/ACT inhaler Use two puffs twice daily with an asthma flare.  Rinse, gargle and spit with water after use. Patient not taking: Reported on 07/26/2015 04/19/15   Adelina Mings, MD  beclomethasone (QVAR) 40 MCG/ACT inhaler Use two puffs twice daily for an asthma flare.  Rinse, gargle and spit after use. 04/18/15   Adelina Mings, MD  Budesonide (PULMICORT FLEXHALER) 90 MCG/ACT inhaler Two puffs twice daily for asthma flare.  Rinse, gargle and spit with water after use. Patient not taking: Reported on 05/24/2015 04/18/15   Adelina Mings, MD  fluticasone [facility] HFA) 110 MCG/ACT inhaler Inhale 2 puffs into the lungs 2 (two) times daily. Patient not taking: Reported on 08/05/2015 04/24/15   Adelina Mings, MD  HYDROcodone-acetaminophen (NORCO/VICODIN) 5-325 MG tablet  Take 1 tablet by mouth every 4 (four) hours as needed for moderate pain. Patient not taking: Reported on 07/26/2015 06/18/15   [place] , MD  tretinoin (RETIN-A) 0.025 % cream Apply topically at bedtime. Patient not taking: Reported on 08/05/2015 04/12/15 04/11/16  Thao P Le, DO   BP 131/98 mmHg  Pulse 86  Temp(Src) 98 F (36.7 C) (Oral)  Resp 18  Ht 5\' 8"  (1.727 m)  Wt 59.875 kg  BMI 20.08 kg/m2  SpO2 100%  LMP 11/21/2014 Physical Exam  Constitutional: She appears well-developed and well-nourished. No distress.  HENT:  Head: Normocephalic and atraumatic.  Right Ear: External ear normal.  Left  Ear: External ear normal.  Eyes: Conjunctivae are normal. Right eye exhibits no discharge. Left eye exhibits no discharge. No scleral icterus.  Neck: Neck supple. No tracheal deviation present.  Cardiovascular: Normal rate, regular rhythm and intact distal pulses.   Pulmonary/Chest: Effort normal and breath sounds normal. No stridor. No respiratory distress. She has no wheezes. She has no rales.  Abdominal: Soft. Bowel sounds are normal. She exhibits no distension. There is no tenderness. There is no rebound and no guarding.  Musculoskeletal: She exhibits no edema or tenderness.  Neurological: She is alert. She has normal strength. No cranial nerve deficit (no facial droop, extraocular movements intact, no slurred speech) or sensory deficit. She exhibits normal muscle tone. She displays no seizure activity. Coordination normal.  Skin: Skin is warm and dry. No rash noted.  Psychiatric: She is hyperactive. She is not agitated, not withdrawn, not actively hallucinating and not combative. She exhibits a depressed mood. She expresses suicidal ideation. She expresses suicidal plans.  Rapid speech,  She is attentive.  Nursing note and vitals reviewed.   ED Course  Procedures (including critical care time) Labs Review Labs Reviewed  COMPREHENSIVE METABOLIC PANEL - Abnormal; Notable for the following:     Glucose, Bld 104 (*)    All other components within normal limits  URINE RAPID DRUG SCREEN, HOSP PERFORMED - Abnormal; Notable for the following:    Benzodiazepines POSITIVE (*)    All other components within normal limits  ETHANOL  CBC     MDM   Final diagnoses:  Suicidal ideation    Patient presented to the emergency room with complaints of suicidal ideation.  Her symptoms are concerning. She has thought about various methods of harming herself. Patient states she spoke to her psychiatrist and they are going to try to get her admitted at [facility]. I will consult TTS to see if we can get that arranged.  Dorie Rank, MD 08/05/15 1257  Dorie Rank, MD 08/05/15 [phone]

## 2015-08-05 NOTE — ED Notes (Signed)
Patient noted sleeping in room. No complaints, stable, in no acute distress. Q15 minute rounds and monitoring via Security Cameras to continue.  

## 2015-08-05 NOTE — ED Notes (Signed)
Patient noted in room. No complaints, stable, in no acute distress. Q15 minute rounds and monitoring via Security Cameras to continue.  

## 2015-08-05 NOTE — ED Notes (Signed)
Bed: [facility] Expected date:  Expected time:  Means of arrival:  Comments: Hold triage 3

## 2015-08-05 NOTE — BH Assessment (Signed)
[place] Assessment Progress Note  Per Helene Kelp at [facility], pt is accepted by Dr. [name] and can arrive no earlier than 9 am tomorrow am. She will be transported to the [facility] C unit and the call report number is [phone].

## 2015-08-05 NOTE — ED Notes (Addendum)
Pt seen 2 weeks ago at [facility], spoke with Dr [name] at [facility]. Pt states she has a history, wants to go to [facility] or somewhere. Pt admitts SI with "tentative plan" "thinking I would get a piece of plywood and screws, put it up in closet, place [employer] and hang myself" "od on pills didn't work"  Used Cocaine (small amt) last week.
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
## 2015-08-05 NOTE — ED Notes (Signed)
Pt has been accepted to [facility] and can arrive after 9:00AM tomorrow. Per Raquel Sarna TTS

## 2015-08-05 NOTE — ED Notes (Addendum)
Pt refused meal tray. Offered peanut butter. Pt refused

## 2015-08-05 NOTE — ED Notes (Signed)
Ambulatory w/o difficulty to room 35

## 2015-08-05 NOTE — ED Notes (Signed)
Pt may take her home abilify and estradiol [place] NP

## 2015-08-06 DIAGNOSIS — R45851 Suicidal ideations: Secondary | ICD-10-CM | POA: Diagnosis not present

## 2015-08-06 DIAGNOSIS — F3130 Bipolar disorder, current episode depressed, mild or moderate severity, unspecified: Secondary | ICD-10-CM | POA: Diagnosis present

## 2015-08-06 MED ORDER — IBUPROFEN 200 MG PO TABS
600.0000 mg | ORAL_TABLET | Freq: Once | ORAL | Status: AC
  Administered 2015-08-06: 600 mg via ORAL
  Filled 2015-08-06: qty 3

## 2015-08-06 NOTE — ED Notes (Signed)
Patient noted sleeping in room. No complaints, stable, in no acute distress. Q15 minute rounds and monitoring via Security Cameras to continue.  

## 2015-08-06 NOTE — ED Notes (Addendum)
Pt discharged ambulatory with [place] driver.  All belongings were sent with patient.  Medications from Pharmacy were sent with pt.

## 2015-08-07 DIAGNOSIS — F4312 Post-traumatic stress disorder, chronic: Secondary | ICD-10-CM | POA: Diagnosis not present

## 2015-08-07 DIAGNOSIS — F3112 Bipolar disorder, current episode manic without psychotic features, moderate: Secondary | ICD-10-CM | POA: Diagnosis not present

## 2015-08-08 DIAGNOSIS — F4312 Post-traumatic stress disorder, chronic: Secondary | ICD-10-CM | POA: Diagnosis not present

## 2015-08-08 DIAGNOSIS — R140 Abdominal distension (gaseous): Secondary | ICD-10-CM | POA: Diagnosis not present

## 2015-08-08 DIAGNOSIS — R339 Retention of urine, unspecified: Secondary | ICD-10-CM | POA: Diagnosis not present

## 2015-08-08 DIAGNOSIS — R1030 Lower abdominal pain, unspecified: Secondary | ICD-10-CM | POA: Diagnosis not present

## 2015-08-08 DIAGNOSIS — Z8659 Personal history of other mental and behavioral disorders: Secondary | ICD-10-CM | POA: Diagnosis not present

## 2015-08-08 DIAGNOSIS — R1110 Vomiting, unspecified: Secondary | ICD-10-CM | POA: Diagnosis not present

## 2015-08-08 DIAGNOSIS — F17210 Nicotine dependence, cigarettes, uncomplicated: Secondary | ICD-10-CM | POA: Diagnosis not present

## 2015-08-08 DIAGNOSIS — F3112 Bipolar disorder, current episode manic without psychotic features, moderate: Secondary | ICD-10-CM | POA: Diagnosis not present

## 2015-08-09 DIAGNOSIS — F3110 Bipolar disorder, current episode manic without psychotic features, unspecified: Secondary | ICD-10-CM | POA: Diagnosis not present

## 2015-08-09 DIAGNOSIS — F4312 Post-traumatic stress disorder, chronic: Secondary | ICD-10-CM | POA: Diagnosis not present

## 2015-08-09 DIAGNOSIS — F4310 Post-traumatic stress disorder, unspecified: Secondary | ICD-10-CM | POA: Diagnosis not present

## 2015-08-09 DIAGNOSIS — F3112 Bipolar disorder, current episode manic without psychotic features, moderate: Secondary | ICD-10-CM | POA: Diagnosis not present

## 2015-08-15 DIAGNOSIS — F4312 Post-traumatic stress disorder, chronic: Secondary | ICD-10-CM | POA: Diagnosis not present

## 2015-08-16 DIAGNOSIS — F3110 Bipolar disorder, current episode manic without psychotic features, unspecified: Secondary | ICD-10-CM | POA: Diagnosis not present

## 2015-08-16 DIAGNOSIS — F4310 Post-traumatic stress disorder, unspecified: Secondary | ICD-10-CM | POA: Diagnosis not present

## 2015-08-23 ENCOUNTER — Emergency Department (HOSPITAL_COMMUNITY)
Admission: EM | Admit: 2015-08-23 | Discharge: 2015-08-23 | Disposition: A | Discharge status: Home / Self Care | Payer: Medicare Other | Attending: Emergency Medicine | Admitting: Emergency Medicine

## 2015-08-23 ENCOUNTER — Encounter (HOSPITAL_COMMUNITY): Payer: Self-pay | Admitting: Emergency Medicine

## 2015-08-23 ENCOUNTER — Encounter: Payer: Self-pay | Admitting: Family Medicine

## 2015-08-23 ENCOUNTER — Ambulatory Visit (INDEPENDENT_AMBULATORY_CARE_PROVIDER_SITE_OTHER): Payer: Medicare Other | Admitting: Family Medicine

## 2015-08-23 ENCOUNTER — Telehealth: Payer: Self-pay

## 2015-08-23 ENCOUNTER — Emergency Department (HOSPITAL_COMMUNITY): Payer: Medicare Other

## 2015-08-23 VITALS — BP 110/68 | HR 90 | Temp 97.7°F | Resp 18 | Ht 67.0 in | Wt 131.0 lb

## 2015-08-23 DIAGNOSIS — N809 Endometriosis, unspecified: Secondary | ICD-10-CM | POA: Diagnosis not present

## 2015-08-23 DIAGNOSIS — M170 Bilateral primary osteoarthritis of knee: Secondary | ICD-10-CM | POA: Insufficient documentation

## 2015-08-23 DIAGNOSIS — J45901 Unspecified asthma with (acute) exacerbation: Secondary | ICD-10-CM

## 2015-08-23 DIAGNOSIS — R102 Pelvic and perineal pain: Secondary | ICD-10-CM

## 2015-08-23 DIAGNOSIS — N80109 Endometriosis of ovary, unspecified side, unspecified depth: Secondary | ICD-10-CM

## 2015-08-23 DIAGNOSIS — J45909 Unspecified asthma, uncomplicated: Secondary | ICD-10-CM | POA: Insufficient documentation

## 2015-08-23 DIAGNOSIS — N801 Endometriosis of ovary: Secondary | ICD-10-CM

## 2015-08-23 DIAGNOSIS — F319 Bipolar disorder, unspecified: Secondary | ICD-10-CM | POA: Insufficient documentation

## 2015-08-23 DIAGNOSIS — F3130 Bipolar disorder, current episode depressed, mild or moderate severity, unspecified: Secondary | ICD-10-CM | POA: Diagnosis not present

## 2015-08-23 DIAGNOSIS — R1030 Lower abdominal pain, unspecified: Secondary | ICD-10-CM

## 2015-08-23 DIAGNOSIS — K5901 Slow transit constipation: Secondary | ICD-10-CM | POA: Diagnosis not present

## 2015-08-23 DIAGNOSIS — N80129 Deep endometriosis of ovary, unspecified ovary: Secondary | ICD-10-CM

## 2015-08-23 DIAGNOSIS — M19041 Primary osteoarthritis, right hand: Secondary | ICD-10-CM | POA: Diagnosis not present

## 2015-08-23 DIAGNOSIS — Z8742 Personal history of other diseases of the female genital tract: Secondary | ICD-10-CM

## 2015-08-23 DIAGNOSIS — M19042 Primary osteoarthritis, left hand: Secondary | ICD-10-CM | POA: Diagnosis not present

## 2015-08-23 DIAGNOSIS — R1032 Left lower quadrant pain: Secondary | ICD-10-CM | POA: Diagnosis not present

## 2015-08-23 DIAGNOSIS — F4310 Post-traumatic stress disorder, unspecified: Secondary | ICD-10-CM | POA: Diagnosis not present

## 2015-08-23 DIAGNOSIS — R3911 Hesitancy of micturition: Secondary | ICD-10-CM

## 2015-08-23 DIAGNOSIS — F3110 Bipolar disorder, current episode manic without psychotic features, unspecified: Secondary | ICD-10-CM | POA: Diagnosis not present

## 2015-08-23 DIAGNOSIS — F17210 Nicotine dependence, cigarettes, uncomplicated: Secondary | ICD-10-CM | POA: Insufficient documentation

## 2015-08-23 DIAGNOSIS — N839 Noninflammatory disorder of ovary, fallopian tube and broad ligament, unspecified: Secondary | ICD-10-CM | POA: Diagnosis not present

## 2015-08-23 LAB — CBC
HEMATOCRIT: 40.4 % (ref 36.0–46.0)
Hemoglobin: 14.1 g/dL (ref 12.0–15.0)
MCH: 32.7 pg (ref 26.0–34.0)
MCHC: 34.9 g/dL (ref 30.0–36.0)
MCV: 93.7 fL (ref 78.0–100.0)
Platelets: 229 10*3/uL (ref 150–400)
RBC: 4.31 MIL/uL (ref 3.87–5.11)
RDW: 13.8 % (ref 11.5–15.5)
WBC: 7.7 10*3/uL (ref 4.0–10.5)

## 2015-08-23 LAB — WET PREP, GENITAL
CLUE CELLS WET PREP: NONE SEEN
SPERM: NONE SEEN
Trich, Wet Prep: NONE SEEN
WBC WET PREP: NONE SEEN
YEAST WET PREP: NONE SEEN

## 2015-08-23 LAB — GC/CHLAMYDIA PROBE AMP (~~LOC~~) NOT AT ARMC
Chlamydia: NEGATIVE
Neisseria Gonorrhea: NEGATIVE

## 2015-08-23 LAB — COMPREHENSIVE METABOLIC PANEL
ALBUMIN: 3.8 g/dL (ref 3.5–5.0)
ALT: 35 U/L (ref 14–54)
AST: 25 U/L (ref 15–41)
Alkaline Phosphatase: 57 U/L (ref 38–126)
Anion gap: 10 (ref 5–15)
BILIRUBIN TOTAL: 0.2 mg/dL — AB (ref 0.3–1.2)
BUN: 22 mg/dL — ABNORMAL HIGH (ref 6–20)
CO2: 23 mmol/L (ref 22–32)
CREATININE: 0.62 mg/dL (ref 0.44–1.00)
Calcium: 9.3 mg/dL (ref 8.9–10.3)
Chloride: 105 mmol/L (ref 101–111)
GFR calc Af Amer: >60 mL/min (ref >60)
GLUCOSE: 120 mg/dL — AB (ref 65–99)
Potassium: 4 mmol/L (ref 3.5–5.1)
Sodium: 138 mmol/L (ref 135–145)
TOTAL PROTEIN: 6.2 g/dL — AB (ref 6.5–8.1)

## 2015-08-23 LAB — LIPASE, BLOOD: Lipase: 31 U/L (ref 11–51)

## 2015-08-23 MED ORDER — HYDROCODONE-ACETAMINOPHEN 5-325 MG PO TABS: 1.0000 | ORAL_TABLET | Freq: Four times a day (QID) | ORAL | Status: DC | PRN

## 2015-08-23 MED ORDER — HYDROCODONE-ACETAMINOPHEN 5-325 MG PO TABS: 1.0000 | ORAL_TABLET | ORAL | Status: DC | PRN

## 2015-08-23 MED ORDER — HYDROCODONE-ACETAMINOPHEN 5-325 MG PO TABS
1.0000 | ORAL_TABLET | Freq: Once | ORAL | Status: AC
  Administered 2015-08-23: 1 via ORAL
  Filled 2015-08-23: qty 1

## 2015-08-23 MED ORDER — ALBUTEROL SULFATE HFA 108 (90 BASE) MCG/ACT IN AERS: 2.0000 | INHALATION_SPRAY | Freq: Four times a day (QID) | RESPIRATORY_TRACT | Status: DC | PRN

## 2015-08-23 NOTE — Discharge Instructions (Signed)
Endometriosis Endometriosis is a condition in which the tissue that lines the uterus (endometrium) grows outside of its normal location. The tissue may grow in many locations close to the uterus, but it commonly grows on the ovaries, fallopian tubes, vagina, or bowel. Because the uterus expels, or sheds, its lining every menstrual cycle, there is bleeding wherever the endometrial tissue is located. This can cause pain because blood is irritating to tissues not normally exposed to it.  CAUSES  The cause of endometriosis is not known.  SIGNS AND SYMPTOMS  Often, there are no symptoms. When symptoms are present, they can vary with the location of the displaced tissue. Various symptoms can occur at different times. Although symptoms occur mainly during a woman's menstrual period, they can also occur midcycle and usually stop with menopause. Some people may go months with no symptoms at all. Symptoms may include:   Back or abdominal pain.   Heavier bleeding during periods.   Pain during intercourse.   Painful bowel movements.   Infertility. DIAGNOSIS  Your health care provider will do a physical exam and ask about your symptoms. Various tests may be done, such as:   Blood tests and urine tests. These are done to help rule out other problems.   Ultrasound. This test is done to look for abnormal tissue.   An X-ray of the lower bowel (barium enema).  Laparoscopy. In this procedure, a thin, lighted tube with a tiny camera on the end (laparoscope) is inserted into your abdomen. This helps your health care provider look for abnormal tissue to confirm the diagnosis. The health care provider may also remove a small piece of tissue (biopsy) from any abnormal tissue found. This tissue sample can then be sent to a lab so it can be looked at under a microscope. TREATMENT  Treatment will vary and may include:   Medicines to relieve pain. Nonsteroidal anti-inflammatory drugs (NSAIDs) are a type of  pain medicine that can help to relieve the pain caused by endometriosis.  Hormonal therapy. When using hormonal therapy, periods are eliminated. This eliminates the monthly exposure to blood by the displaced endometrial tissue.   Surgery. Surgery may sometimes be done to remove the abnormal endometrial tissue. In severe cases, surgery may be done to remove the fallopian tubes, uterus, and ovaries (hysterectomy). HOME CARE INSTRUCTIONS   Take all medicines as directed by your health care provider. Do not take aspirin because it may increase bleeding when you are not on hormonal therapy.   Avoid activities that produce pain, including sexual activity. SEEK MEDICAL CARE IF:  You have pelvic pain before, after, or during your periods.  You have pelvic pain between periods that gets worse during your period.  You have pelvic pain during or after sex.  You have pelvic pain with bowel movements or urination, especially during your period.  You have problems getting pregnant.  You have a fever. SEEK IMMEDIATE MEDICAL CARE IF:   Your pain is severe and is not responding to pain medicine.   You have severe nausea and vomiting, or you cannot keep foods down.   You have pain that is limited to the right lower part of your abdomen.   You have swelling or increasing pain in your abdomen.   You see blood in your stool.  MAKE SURE YOU:   Understand these instructions.  Will watch your condition.  Will get help right away if you are not doing well or get worse.   This information  is not intended to replace advice given to you by your health care provider. Make sure you discuss any questions you have with your health care provider.   Document Released: 03/21/2000 Document Revised: 04/14/2014 Document Reviewed: 11/19/2012 Elsevier Interactive Patient Education 2016 [employer] Abdominal Pain, Adult Many things can cause abdominal pain. Usually, abdominal pain is not caused by a  disease and will improve without treatment. It can often be observed and treated at home. Your health care provider will do a physical exam and possibly order blood tests and X-rays to help determine the seriousness of your pain. However, in many cases, more time must pass before a clear cause of the pain can be found. Before that point, your health care provider may not know if you need more testing or further treatment. HOME CARE INSTRUCTIONS Monitor your abdominal pain for any changes. The following actions may help to alleviate any discomfort you are experiencing:  Only take over-the-counter or prescription medicines as directed by your health care provider.  Do not take laxatives unless directed to do so by your health care provider.  Try a clear liquid diet (broth, tea, or water) as directed by your health care provider. Slowly move to a bland diet as tolerated. SEEK MEDICAL CARE IF:  You have unexplained abdominal pain.  You have abdominal pain associated with nausea or diarrhea.  You have pain when you urinate or have a bowel movement.  You experience abdominal pain that wakes you in the night.  You have abdominal pain that is worsened or improved by eating food.  You have abdominal pain that is worsened with eating fatty foods.  You have a fever. SEEK IMMEDIATE MEDICAL CARE IF:  Your pain does not go away within 2 hours.  You keep throwing up (vomiting).  Your pain is felt only in portions of the abdomen, such as the right side or the left lower portion of the abdomen.  You pass bloody or black tarry stools. MAKE SURE YOU:  Understand these instructions.  Will watch your condition.  Will get help right away if you are not doing well or get worse.   This information is not intended to replace advice given to you by your health care provider. Make sure you discuss any questions you have with your health care provider.   Document Released: 01/01/2005 Document Revised:  12/13/2014 Document Reviewed: 12/01/2012 Elsevier Interactive Patient Education [employer].

## 2015-08-23 NOTE — ED Provider Notes (Signed)
CSN: [id number]     Arrival date & time 08/23/15  0127 History   First MD Initiated Contact with Patient 08/23/15 0245     Chief Complaint  Patient presents with  . Abdominal Pain     (Consider location/radiation/quality/duration/timing/severity/associated sxs/prior Treatment) HPI   Patient presents to the emergency department with past medical history of asthma, anxiety, mental disorder, bipolar disorder, endometriosis, depression, fibromyalgia, pituitary tumor, PTSD complaints of left lower quadrant abdominal pain. She says that she has endometriosis and this feels exactly the same she is concerned that she is getting a lesion on the left side has become larger. She also is reporting having some urinary retention. Patient states she does not want blood drawn, she does not want a urinary catheter, she does not want a urinalysis done nor pelvic or CT scan of abdomen. She states that she just wants an ultrasound to see with scarring on and then the big for pain at home. She's had some nausea but denies having any vomiting or diarrhea. She denies any fever, chills, back pain, vaginal discharge, vaginal bleeding.  Past Medical History  Diagnosis Date  . Asthma   . Anxiety   . Mental disorder   . Bipolar 1 disorder ([place])   . Endometriosis   . PONV (postoperative nausea and vomiting)   . Termination of pregnancy     x 2 at age 5 and 45 yrs old  . Depression   . Headache(784.0)     otc med prn  . Arthritis     hands, lower back, knees  . Fibromyalgia   . Scoliosis   . Scoliosis   . Allergy   . Pituitary tumor ([place])   . PTSD (post-traumatic stress disorder)    Past Surgical History  Procedure Laterality Date  . Nose surgery      rhinoplasty at age 58 yrs  . Wisdom tooth extraction    . Facial cosmetic surgery      right cheek  . Laparoscopy Right 11/11/2013    Procedure: LAPAROSCOPY OPERATIVE with  Drainage of  RIGHT Ovarian ENDOMETRIOMA;  Surgeon: Elveria Royals, MD;  Location:  [facility];  Service: Gynecology;  Laterality: Right;  . Dilation and curettage of uterus    . Breast surgery     Family History  Problem Relation Age of Onset  . Diabetes Mother   . Hypertension Mother   . Stroke Mother 18    CVA  . Mental illness Mother     no diagnosis; personality disorder  . Hyperlipidemia Father   . Hypertension Father   . COPD Father   . Alpha-1 antitrypsin deficiency Father   . Diabetes Maternal Grandmother   . Heart disease Maternal Grandmother   . Hyperlipidemia Maternal Grandmother   . Hypertension Maternal Grandmother   . Mental illness Maternal Grandmother   . Heart disease Maternal Grandfather   . Hyperlipidemia Maternal Grandfather   . Hypertension Maternal Grandfather   . Heart disease Paternal Grandfather   . Hyperlipidemia Paternal Grandfather   . Hypertension Paternal Grandfather   . Stroke Paternal Grandfather   . Alpha-1 antitrypsin deficiency Brother    Social History  Substance Use Topics  . Smoking status: Current Every Day Smoker -- 0.00 packs/day for 29 years    Types: Cigarettes, E-cigarettes  . Smokeless tobacco: Never Used  . Alcohol Use: Yes     Comment: "   OB History    Gravida Para Term Preterm AB TAB SAB Ectopic Multiple Living  2    2 2     0     Review of Systems  Review of Systems All other systems negative except as documented in the HPI. All pertinent positives and negatives as reviewed in the HPI.   Allergies  Sulfamethoxazole; Oxycodone; Abilify; Penicillins; Other; Percocet; Sulfa antibiotics; and Sulfites  Home Medications   Prior to Admission medications   Medication Sig Start Date End Date Taking? Authorizing Provider  albuterol (PROVENTIL HFA;VENTOLIN HFA) 108 (90 Base) MCG/ACT inhaler Inhale 2 puffs into the lungs every 6 (six) hours as needed for wheezing or shortness of breath (cough, shortness of breath or wheezing.). 04/18/15  Yes Adelina Mings, MD  alprazolam Duanne Moron) 2 MG tablet Take 2 mg by  mouth 4 (four) times daily as needed for anxiety.    Yes Historical Provider, MD  ARIPiprazole (ABILIFY) 5 MG tablet Take 7.5 mg by mouth every evening.    Yes Historical Provider, MD  EPINEPHrine (EPIPEN 2-PAK) 0.3 mg/0.3 mL IJ SOAJ injection Inject 0.3 mLs (0.3 mg total) into the muscle once. 04/18/15  Yes Adelina Mings, MD  ibuprofen (ADVIL,MOTRIN) 800 MG tablet Take 1 tablet (800 mg total) by mouth every 8 (eight) hours as needed. Patient taking differently: Take 800 mg by mouth every 8 (eight) hours as needed for headache or moderate pain.  07/26/15  Yes Wardell Honour, MD  beclomethasone (QVAR) 40 MCG/ACT inhaler Use two puffs twice daily with an asthma flare.  Rinse, gargle and spit with water after use. Patient not taking: Reported on 07/26/2015 04/19/15   Adelina Mings, MD  beclomethasone (QVAR) 40 MCG/ACT inhaler Use two puffs twice daily for an asthma flare.  Rinse, gargle and spit after use. 04/18/15   Adelina Mings, MD  Budesonide (PULMICORT FLEXHALER) 90 MCG/ACT inhaler Two puffs twice daily for asthma flare.  Rinse, gargle and spit with water after use. Patient not taking: Reported on 05/24/2015 04/18/15   Adelina Mings, MD  fluticasone [facility] HFA) 110 MCG/ACT inhaler Inhale 2 puffs into the lungs 2 (two) times daily. Patient not taking: Reported on 08/05/2015 04/24/15   Adelina Mings, MD  HYDROcodone-acetaminophen (NORCO/VICODIN) 5-325 MG tablet Take 1 tablet by mouth every 4 (four) hours as needed for moderate pain. Patient not taking: Reported on 07/26/2015 06/18/15   Shawnee Knapp, MD  tretinoin (RETIN-A) 0.025 % cream Apply topically at bedtime. Patient not taking: Reported on 08/05/2015 04/12/15 04/11/16  Thao P Le, DO   BP 95/61 mmHg  Pulse 88  Temp(Src) 98 F (36.7 C) (Oral)  Resp 16  Ht 5\' 8"  (1.727 m)  Wt 58.968 kg  BMI 19.77 kg/m2  SpO2 96%  LMP 11/21/2014 Physical Exam  Constitutional: She appears well-developed and well-nourished. No distress.   HENT:  Head: Normocephalic and atraumatic.  Right Ear: Tympanic membrane and ear canal normal.  Left Ear: Tympanic membrane and ear canal normal.  Nose: Nose normal.  Mouth/Throat: Uvula is midline, oropharynx is clear and moist and mucous membranes are normal.  Eyes: Pupils are equal, round, and reactive to light.  Neck: Normal range of motion. Neck supple.  Cardiovascular: Normal rate and regular rhythm.   Pulmonary/Chest: Effort normal.  Abdominal: Soft.  No signs of abdominal distention  Genitourinary: Vagina normal and uterus normal. There is no rash or tenderness on the right labia. There is no tenderness on the left labia. Cervix exhibits no motion tenderness and no discharge. Right adnexum displays no mass and no tenderness. Left  adnexum displays no mass and no tenderness. No tenderness in the vagina. No vaginal discharge found.  Musculoskeletal:  No LE swelling  Neurological: She is alert.  Acting at baseline  Skin: Skin is warm and dry. No rash noted.  Nursing note and vitals reviewed.   ED Course  Procedures (including critical care time) Labs Review Labs Reviewed  COMPREHENSIVE METABOLIC PANEL - Abnormal; Notable for the following:    Glucose, Bld 120 (*)    BUN 22 (*)    Total Protein 6.2 (*)    Total Bilirubin 0.2 (*)    All other components within normal limits  WET PREP, GENITAL  LIPASE, BLOOD  CBC  URINALYSIS, ROUTINE W REFLEX MICROSCOPIC (NOT AT [facility])  POC URINE PREG, ED  GC/CHLAMYDIA PROBE AMP ([place]) NOT AT [facility]    Imaging Review US Transvaginal Non-ob  08/23/2015  CLINICAL DATA:  Pelvic pain.  History of right ovarian endometrioma. EXAM: TRANSABDOMINAL AND TRANSVAGINAL ULTRASOUND OF PELVIS TECHNIQUE: Both transabdominal and transvaginal ultrasound examinations of the pelvis were performed. Transabdominal technique was performed for global imaging of the pelvis including uterus, ovaries, adnexal regions, and pelvic cul-de-sac. It was necessary to  proceed with endovaginal exam following the transabdominal exam to visualize the endometrium and ovaries. COMPARISON:  10/24/2014 FINDINGS: Uterus Measurements: 6.7 x 3.2 x 4.2 cm. Uterus is anteverted. A low-attenuation sub serosal fibroid is demonstrated anteriorly in the uterine fundus measuring about 1.3 x 1 x 1.2 cm. Endometrium Thickness: 6.5 mm.  No focal abnormality visualized. Right ovary Measurements: 4.2 x 2.9 x 3.6 cm. Hypoechoic mass in the right ovary with diffuse low level echoes measuring 3.8 x 2.5 x 3.3 cm. No internal flow on color flow Doppler imaging. This is consistent with a hemorrhagic lesion in likely represents recurrent or residual endometrioma. Appearance of the lesion is similar to the endometrium as seen on previous study. Left ovary Measurements: 3.7 x 2.8 x 2.8 cm. Dominant follicle measuring about 1.2 cm diameter. No abnormal left adnexal mass. Other findings Small amount of free fluid in the pelvis. IMPRESSION: Hypoechoic mass in the right ovary with diffuse low-level echoes measuring 3.8 cm maximal diameter. Appearance is similar to the endometrioma demonstrated on previous study. This likely represents residual or recurrent endometrioma. Small anterior uterine fibroid. Electronically Signed   By: Lucienne Capers M.D.   On: 08/23/2015 04:31   US Pelvis Complete  08/23/2015  CLINICAL DATA:  Pelvic pain.  History of right ovarian endometrioma. EXAM: TRANSABDOMINAL AND TRANSVAGINAL ULTRASOUND OF PELVIS TECHNIQUE: Both transabdominal and transvaginal ultrasound examinations of the pelvis were performed. Transabdominal technique was performed for global imaging of the pelvis including uterus, ovaries, adnexal regions, and pelvic cul-de-sac. It was necessary to proceed with endovaginal exam following the transabdominal exam to visualize the endometrium and ovaries. COMPARISON:  10/24/2014 FINDINGS: Uterus Measurements: 6.7 x 3.2 x 4.2 cm. Uterus is anteverted. A low-attenuation sub  serosal fibroid is demonstrated anteriorly in the uterine fundus measuring about 1.3 x 1 x 1.2 cm. Endometrium Thickness: 6.5 mm.  No focal abnormality visualized. Right ovary Measurements: 4.2 x 2.9 x 3.6 cm. Hypoechoic mass in the right ovary with diffuse low level echoes measuring 3.8 x 2.5 x 3.3 cm. No internal flow on color flow Doppler imaging. This is consistent with a hemorrhagic lesion in likely represents recurrent or residual endometrioma. Appearance of the lesion is similar to the endometrium as seen on previous study. Left ovary Measurements: 3.7 x 2.8 x 2.8 cm. Dominant follicle measuring  about 1.2 cm diameter. No abnormal left adnexal mass. Other findings Small amount of free fluid in the pelvis. IMPRESSION: Hypoechoic mass in the right ovary with diffuse low-level echoes measuring 3.8 cm maximal diameter. Appearance is similar to the endometrioma demonstrated on previous study. This likely represents residual or recurrent endometrioma. Small anterior uterine fibroid. Electronically Signed   By: Lucienne Capers M.D.   On: 08/23/2015 04:31   I have personally reviewed and evaluated these images and lab results as part of my medical decision-making.   EKG Interpretation None      MDM   Final diagnoses:  Pelvic pain in female  Personal history of endometriosis  Endometrioma   Patient caught smoking in bathroom and advised not to smoke again or she will be asked to leave. She agrees to blood work, refuses urinary catheter despite retention. States that she understands that she may end up having to come back when she develops pain, she is also been advised that her pain could be due to urinary retention. She declines having a bladder scan. He agreed to having blood work which shows mildly elevated BUN, CBC and lipase are unremarkable. The patient's transvaginal ultrasound pelvic ultrasound does not show cause for patient's pain.  She will accept pelvic to be done to check for infection  but declines having CT scan of the abdomen and pelvis. The patient understands that without further imaging or urinalysis I cannot advise her of her diagnosis.   Patient is difficult, she tells me she does not have diverticulitis, colitis, a urine infection, urine retention. She tells me she does not have a pelvic infection and that she knows her body.   Patient requesting to leave before wet prep has resulted. She will call her PCP, Dr. Tamala Julian she says and have her results looked up. Pt getting agitated as she has not smoked in a couple or hours. Understands that she will have delay in treatment if she has infection, pt has still not urinated and continues to refuse in and out cath or bladder scan. Pt went out to smoke and therefore was discharged from the system @ 6:10 am  Delos Haring, PA-C [id number]  David Glick, MD [id number]

## 2015-08-23 NOTE — ED Notes (Signed)
Patient refuses to give urine specimen. Patient also refuses in and out catheter. States I will only do ultrasound. Notified EDP.

## 2015-08-23 NOTE — Telephone Encounter (Signed)
Pt saw Dr. Tamala Julian in office today.

## 2015-08-23 NOTE — ED Notes (Signed)
PA-C at bedside 

## 2015-08-23 NOTE — Progress Notes (Signed)
Subjective:    Patient ID: Catherine Olsen, female    DOB: 1970/06/14, 45 y.o.   MRN: [id number]  08/23/2015  Follow-up   HPI This 45 y.o. female presents for evaluation of pelvic pain.  Thinks having   Recent admission behavioral health; started back on Abilify; developed horrible constipation and urinary retention.  Has appointment with endometriosis specialist on 09/07/15.  Had horrible pain last night; worried that torqing ovary; unable to urinate.  After pain medication and coffee, after 40 minutes able to urinate.  Really wanted to avoid foley catheter; psychologically, catheter emotionally traumatic.  S/p ED visit; s/p pelvic ultrasound; R ovarian mass 3.8; L ovary cyst.  Has HRT patch.  As Abilify dose increases, L pelvic pain worsens.  Suggested possible repeat laparoscopy; pt refuses to undergo repeat exploratory laparoscopy.  Mania with premature menopause caused horrible paranoia.  Thinks government out to get her.  Has called attorney; obsessed; becoming suicidal; concerning hanging self; was worried that someone was going accuse pt of something.  Had a plan of how to hang self to avoid accusations.  Admitted at [facility].  Non-compliance causes mania with paranioa.  Seeing new therapist weekly who is wonderful. Provided with hydrocodone #6 last night in ED.  Begged mother for car this morning; sent MyChart message.  Hydrocodone helped in ED.  Fell asleep in ultrasound.  Plans to wean Abilify; taking Xanax 2mg  bid.  If having PTSD flip out; not taking 2mg  bid.  Around therapy, so wound up.  When not manic, does not take mood stabilizer.  Agreeable to take mood stabilizer when crazy; admits that starts very late.  Now starting to wean down.  Cannot tolerate generic Abilify.  Followed by Lattie Haw once monthly.  Being treated specifically for PTSD.  Constipation started severely in hospital; was giving MgCitrate, Senakot, drinking tons of water.  Nightmare.  This week, drinking water and hurts to walk  on L.   Unable to do yoga due to pain.  Not having a b.m. Daily; baseline is daily. Taking Mgcitrate daily for bone health.  No stool softener at home; happy with having b.m. Qod.  Has urinated at home after leaving emergency department; urinated at home around 6:30am.  Taking MgCitrate 500mg  daily; liquid bubbly daily.    S/p CBC WNL; CMET WNL.  Lipase 31. Wet prep negative; no sexual intercourse in two years.  [place] Controlled Substance Registry review:  Hydrocodone 5-325 #20 on 06/19/15.  Hydrocodone 5/325 #20 3/11, 04/12/15 hydroocodone 5/325 #30 Multiple fills of a variety of benzos:  5/9 Alprazolam 2mg , 4/6 alprazolam 2mg  #30, 4/5 clonazpeam 2mg  #60, 3/24 Temazepam 15mg  #30. Review of Systems  Constitutional: Negative for fever, chills, diaphoresis and fatigue.  Eyes: Negative for visual disturbance.  Respiratory: Negative for cough and shortness of breath.   Cardiovascular: Negative for chest pain, palpitations and leg swelling.  Gastrointestinal: Negative for nausea, vomiting, abdominal pain, diarrhea and constipation.  Endocrine: Negative for cold intolerance, heat intolerance, polydipsia, polyphagia and polyuria.  Genitourinary: Positive for pelvic pain.  Neurological: Negative for dizziness, tremors, seizures, syncope, facial asymmetry, speech difficulty, weakness, light-headedness, numbness and headaches.  Psychiatric/Behavioral: Positive for sleep disturbance and dysphoric mood. Negative for suicidal ideas and self-injury. The patient is nervous/anxious.     Past Medical History  Diagnosis Date  . Asthma   . Anxiety   . Mental disorder   . Bipolar 1 disorder ([place])   . Endometriosis   . PONV (postoperative nausea and vomiting)   .  Termination of pregnancy     x 2 at age 89 and 45 yrs old  . Depression   . Headache(784.0)     otc med prn  . Arthritis     hands, lower back, knees  . Fibromyalgia   . Scoliosis   . Scoliosis   . Allergy   . Pituitary tumor ([place])   . PTSD  (post-traumatic stress disorder)    Past Surgical History  Procedure Laterality Date  . Nose surgery      rhinoplasty at age 44 yrs  . Wisdom tooth extraction    . Facial cosmetic surgery      right cheek  . Laparoscopy Right 11/11/2013    Procedure: LAPAROSCOPY OPERATIVE with  Drainage of  RIGHT Ovarian ENDOMETRIOMA;  Surgeon: Elveria Royals, MD;  Location: [facility];  Service: Gynecology;  Laterality: Right;  . Dilation and curettage of uterus    . Breast surgery     Allergies  Allergen Reactions  . Sulfamethoxazole Anaphylaxis  . Oxycodone Other (See Comments)    Severe blindness?  . Abilify [Aripiprazole]     Generic Abilify--causes severe neurological problems. Can use name brand  . Penicillins Hives    Has patient had a PCN reaction causing immediate rash, facial/tongue/throat swelling, SOB or lightheadedness with hypotension: no Has patient had a PCN reaction causing severe rash involving mucus membranes or skin necrosis: NO Has patient had a PCN reaction that required hospitalizationNO Has patient had a PCN reaction occurring within the last 10 years:NO If all of the above answers are "NO", then may proceed with Cephalosporin use.   . Other Anxiety    steroids  . Percocet [Oxycodone-Acetaminophen] Palpitations  . Sulfa Antibiotics Hives  . Sulfites Other (See Comments)    unknown    Social History   Social History  . Marital Status: Divorced    Spouse Name: N/A  . Number of Children: 0  . Years of Education: N/A   Occupational History  . disability     mental illness   Social History Main Topics  . Smoking status: Current Every Day Smoker -- 0.00 packs/day for 29 years    Types: Cigarettes, E-cigarettes  . Smokeless tobacco: Never Used  . Alcohol Use: Yes     Comment: "  . Drug Use: Yes    Special: "Crack" cocaine, Cocaine     Comment: Heavy user per patient, last use Tuesday 11/08/13  LAST CRACK  2015  . Sexual Activity: No     Comment: abortion at 43 and  63yrs. of age    Other Topics Concern  . Not on file   Social History Narrative   Marital status: divorced; not dating      Children: none      Lives: alone with mom      Employment: disability for mental illness in 1997.  Hospitalizations x 9 in past.      Tobacco: 1 ppd x since age 47.      Alcohol: socially; beers.      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      Family History  Problem Relation Age of Onset  . Diabetes Mother   . Hypertension Mother   . Stroke Mother 28    CVA  . Mental illness Mother     no diagnosis; personality disorder  . Hyperlipidemia Father   . Hypertension Father   . COPD Father   . Alpha-1 antitrypsin deficiency Father   .  Diabetes Maternal Grandmother   . Heart disease Maternal Grandmother   . Hyperlipidemia Maternal Grandmother   . Hypertension Maternal Grandmother   . Mental illness Maternal Grandmother   . Heart disease Maternal Grandfather   . Hyperlipidemia Maternal Grandfather   . Hypertension Maternal Grandfather   . Heart disease Paternal Grandfather   . Hyperlipidemia Paternal Grandfather   . Hypertension Paternal Grandfather   . Stroke Paternal Grandfather   . Alpha-1 antitrypsin deficiency Brother        Objective:    BP 110/68 mmHg  Pulse 90  Temp(Src) 97.7 F (36.5 C) (Oral)  Resp 18  Ht 5\' 7"  (1.702 m)  Wt 131 lb (59.421 kg)  BMI 20.51 kg/m2  SpO2 98%  LMP 11/21/2014 Physical Exam  Constitutional: She is oriented to person, place, and time. She appears well-developed and well-nourished. No distress.  HENT:  Head: Normocephalic and atraumatic.  Right Ear: External ear normal.  Left Ear: External ear normal.  Nose: Nose normal.  Mouth/Throat: Oropharynx is clear and moist.  Eyes: Conjunctivae and EOM are normal. Pupils are equal, round, and reactive to light.  Neck: Normal range of motion. Neck supple. Carotid bruit is not present. No thyromegaly present.  Cardiovascular: Normal rate,  regular rhythm, normal heart sounds and intact distal pulses.  Exam reveals no gallop and no friction rub.   No murmur heard. Pulmonary/Chest: Effort normal and breath sounds normal. She has no wheezes. She has no rales.  Abdominal: Soft. Bowel sounds are normal. She exhibits no distension and no mass. There is no tenderness. There is no rebound and no guarding.  Lymphadenopathy:    She has no cervical adenopathy.  Neurological: She is alert and oriented to person, place, and time. No cranial nerve deficit.  Skin: Skin is warm and dry. No rash noted. She is not diaphoretic. No erythema. No pallor.  Psychiatric: She has a normal mood and affect. Her behavior is normal.   Results for orders placed or performed during the hospital encounter of 08/23/15  Wet prep, genital  Result Value Ref Range   Yeast Wet Prep HPF POC NONE SEEN NONE SEEN   Trich, Wet Prep NONE SEEN NONE SEEN   Clue Cells Wet Prep HPF POC NONE SEEN NONE SEEN   WBC, Wet Prep HPF POC NONE SEEN NONE SEEN   Sperm NONE SEEN   Lipase, blood  Result Value Ref Range   Lipase 31 11 - 51 U/L  Comprehensive metabolic panel  Result Value Ref Range   Sodium 138 135 - 145 mmol/L   Potassium 4.0 3.5 - 5.1 mmol/L   Chloride 105 101 - 111 mmol/L   CO2 23 22 - 32 mmol/L   Glucose, Bld 120 (H) 65 - 99 mg/dL   BUN 22 (H) 6 - 20 mg/dL   Creatinine, Ser 0.62 0.44 - 1.00 mg/dL   Calcium 9.3 8.9 - 10.3 mg/dL   Total Protein 6.2 (L) 6.5 - 8.1 g/dL   Albumin 3.8 3.5 - 5.0 g/dL   AST 25 15 - 41 U/L   ALT 35 14 - 54 U/L   Alkaline Phosphatase 57 38 - 126 U/L   Total Bilirubin 0.2 (L) 0.3 - 1.2 mg/dL   GFR calc non Af Amer >60 >60 mL/min   GFR calc Af Amer >60 >60 mL/min   Anion gap 10 5 - 15  CBC  Result Value Ref Range   WBC 7.7 4.0 - 10.5 K/uL   RBC 4.31 3.87 -  5.11 MIL/uL   Hemoglobin 14.1 12.0 - 15.0 g/dL   HCT 40.4 36.0 - 46.0 %   MCV 93.7 78.0 - 100.0 fL   MCH 32.7 26.0 - 34.0 pg   MCHC 34.9 30.0 - 36.0 g/dL   RDW 13.8 11.5 -  15.5 %   Platelets 229 150 - 400 K/uL  GC/Chlamydia probe amp ([place])not at [facility]  Result Value Ref Range   Chlamydia Negative    Neisseria gonorrhea Negative    No results found.     Assessment & Plan:   1. Pelvic pain in female   2. Slow transit constipation   3. Urinary hesitancy   4. Asthma with acute exacerbation, unspecified asthma severity   5. Bipolar disorder, most recent episode depressed ([place])   6. Endometrioma of ovary     No orders of the defined types were placed in this encounter.   Meds ordered this encounter  Medications  . DISCONTD: HYDROcodone-acetaminophen (NORCO/VICODIN) 5-325 MG tablet    Sig: Take 1 tablet by mouth every 6 (six) hours as needed.    Dispense:  6 tablet    Refill:  0  . DISCONTD: albuterol (PROVENTIL HFA;VENTOLIN HFA) 108 (90 Base) MCG/ACT inhaler    Sig: Inhale 2 puffs into the lungs every 6 (six) hours as needed for wheezing or shortness of breath (cough, shortness of breath or wheezing.).    Dispense:  1 Inhaler    Refill:  1    Patient prefers Proair.  Marland Kitchen DISCONTD: HYDROcodone-acetaminophen (NORCO/VICODIN) 5-325 MG tablet    Sig: Take 1 tablet by mouth every 6 (six) hours as needed.    Dispense:  60 tablet    Refill:  0    No Follow-up on file.    Adelina Collard Elayne Guerin, M.D. [facility] [address] [place], [place]  [address] [phone] phone [phone] fax

## 2015-08-23 NOTE — Patient Instructions (Addendum)
Senakot-S or Peri-colace 1-2 tablets twice daily for constipation; you will need to take this while taking pain medication.    Constipation, Adult Constipation is when a person has fewer than three bowel movements a week, has difficulty having a bowel movement, or has stools that are dry, hard, or larger than normal. As people grow older, constipation is more common. A low-fiber diet, not taking in enough fluids, and taking certain medicines may make constipation worse.  CAUSES   Certain medicines, such as antidepressants, pain medicine, iron supplements, antacids, and water pills.   Certain diseases, such as diabetes, irritable bowel syndrome (IBS), thyroid disease, or depression.   Not drinking enough water.   Not eating enough fiber-rich foods.   Stress or travel.   Lack of physical activity or exercise.   Ignoring the urge to have a bowel movement.   Using laxatives too much.  SIGNS AND SYMPTOMS   Having fewer than three bowel movements a week.   Straining to have a bowel movement.   Having stools that are hard, dry, or larger than normal.   Feeling full or bloated.   Pain in the lower abdomen.   Not feeling relief after having a bowel movement.  DIAGNOSIS  Your health care provider will take a medical history and perform a physical exam. Further testing may be done for severe constipation. Some tests may include:  A barium enema X-ray to examine your rectum, colon, and, sometimes, your small intestine.   A sigmoidoscopy to examine your lower colon.   A colonoscopy to examine your entire colon. TREATMENT  Treatment will depend on the severity of your constipation and what is causing it. Some dietary treatments include drinking more fluids and eating more fiber-rich foods. Lifestyle treatments may include regular exercise. If these diet and lifestyle recommendations do not help, your health care provider may recommend taking over-the-counter laxative  medicines to help you have bowel movements. Prescription medicines may be prescribed if over-the-counter medicines do not work.  HOME CARE INSTRUCTIONS   Eat foods that have a lot of fiber, such as fruits, vegetables, whole grains, and beans.  Limit foods high in fat and processed sugars, such as french fries, hamburgers, cookies, candies, and soda.   A fiber supplement may be added to your diet if you cannot get enough fiber from foods.   Drink enough fluids to keep your urine clear or pale yellow.   Exercise regularly or as directed by your health care provider.   Go to the restroom when you have the urge to go. Do not hold it.   Only take over-the-counter or prescription medicines as directed by your health care provider. Do not take other medicines for constipation without talking to your health care provider first.  [place] IF:   You have bright red blood in your stool.   Your constipation lasts for more than 4 days or gets worse.   You have abdominal or rectal pain.   You have thin, pencil-like stools.   You have unexplained weight loss. MAKE SURE YOU:   Understand these instructions.  Will watch your condition.  Will get help right away if you are not doing well or get worse.   This information is not intended to replace advice given to you by your health care provider. Make sure you discuss any questions you have with your health care provider.   Document Released: 12/21/2003 Document Revised: 04/14/2014 Document Reviewed: 01/03/2013 Elsevier Interactive Patient Education 2016 Elsevier  Inc.     IF you received an x-ray today, you will receive an invoice from [facility]. Please contact [facility] at [phone] with questions or concerns regarding your invoice.   IF you received labwork today, you will receive an invoice from [employer]. Please contact [facility] at [phone] with  questions or concerns regarding your invoice.   Our billing staff will not be able to assist you with questions regarding bills from these companies.  You will be contacted with the lab results as soon as they are available. The fastest way to get your results is to activate your My Chart account. Instructions are located on the last page of this paperwork. If you have not heard from [place] regarding the results in 2 weeks, please contact this office.

## 2015-08-23 NOTE — Telephone Encounter (Signed)
Pt was seen in [facility] ED last night for Abdominal Pain. She was prescribed HYDROcodone-acetaminophen (NORCO/VICODIN) 5-325 MG tablet [id number]. She doesn't have an appointment with her specialist until 6/2. She would like to be prescribed enough Hydrocodone to last her until this upcoming visit. I let her know our policy is that you have to be seen in our clinic for this-she wanted this message put in regardless. CB # [phone]

## 2015-08-23 NOTE — ED Notes (Signed)
Pt. reports upper abdominal pain onset this week with nausea , denies emesis or diarrhea , no fever or chills.

## 2015-08-27 NOTE — Telephone Encounter (Signed)
Patient evaluated in office on 08/23/2015.  No further action warranted.

## 2015-08-30 DIAGNOSIS — F4310 Post-traumatic stress disorder, unspecified: Secondary | ICD-10-CM | POA: Diagnosis not present

## 2015-08-30 DIAGNOSIS — F3110 Bipolar disorder, current episode manic without psychotic features, unspecified: Secondary | ICD-10-CM | POA: Diagnosis not present

## 2015-09-06 ENCOUNTER — Encounter: Payer: Self-pay | Admitting: Family Medicine

## 2015-09-06 ENCOUNTER — Telehealth: Payer: Self-pay | Admitting: Family Medicine

## 2015-09-06 DIAGNOSIS — J45901 Unspecified asthma with (acute) exacerbation: Secondary | ICD-10-CM

## 2015-09-06 MED ORDER — ALBUTEROL SULFATE HFA 108 (90 BASE) MCG/ACT IN AERS: 2.0000 | INHALATION_SPRAY | Freq: Four times a day (QID) | RESPIRATORY_TRACT | Status: DC | PRN

## 2015-09-06 MED ORDER — TRETINOIN 0.025 % EX CREA: TOPICAL_CREAM | Freq: Every day | CUTANEOUS | Status: DC

## 2015-09-07 DIAGNOSIS — Z780 Asymptomatic menopausal state: Secondary | ICD-10-CM | POA: Diagnosis not present

## 2015-09-10 MED ORDER — TRETINOIN 0.025 % EX CREA: TOPICAL_CREAM | Freq: Every day | CUTANEOUS | Status: DC

## 2015-09-10 NOTE — Telephone Encounter (Signed)
Faxed

## 2015-09-10 NOTE — Addendum Note (Signed)
Addended by: Fara Chute on: 09/10/2015 10:27 AM   Modules accepted: Orders

## 2015-09-10 NOTE — Telephone Encounter (Signed)
Patient notified via My Chart.  Meds ordered this encounter  Medications  . tretinoin (RETIN-A) 0.025 % cream    Sig: Apply topically at bedtime.    Dispense:  45 g    Refill:  0    Order Specific Question:  Supervising Provider    Answer:  DOOLITTLE, ROBERT P [[id number]]

## 2015-09-11 IMAGING — US US TRANSVAGINAL NON-OB
1 series · 13 of 25 positions shown · non-contrast
Comparison: 02/03/2014

CLINICAL DATA: Lower right abdominal pain. History of endometrioma.
Evaluate for torsion.

EXAM:
TRANSVAGINAL ULTRASOUND OF PELVIS
DOPPLER ULTRASOUND OF OVARIES
TECHNIQUE: Transvaginal ultrasound examination of the pelvis was performed
including evaluation of the uterus, ovaries, adnexal regions, and
pelvic cul-de-sac.
Color and duplex Doppler ultrasound was utilized to evaluate blood
flow to the ovaries.

[Series 1: us transvaginal non-ob · 13 of 41 slices shown]
[im 1/41]
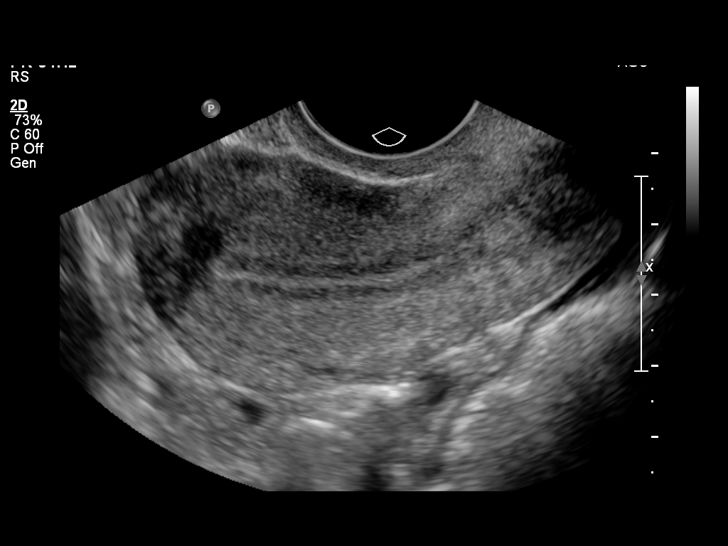
[im 4/41]
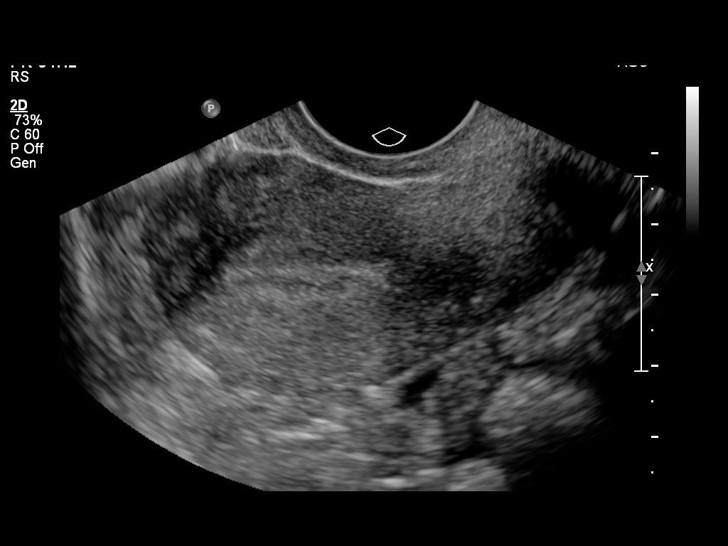
[im 7/41]
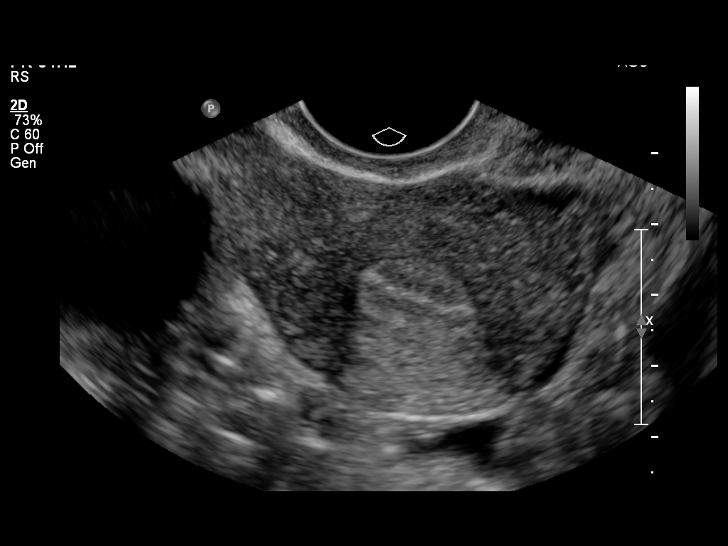
[im 11/41]
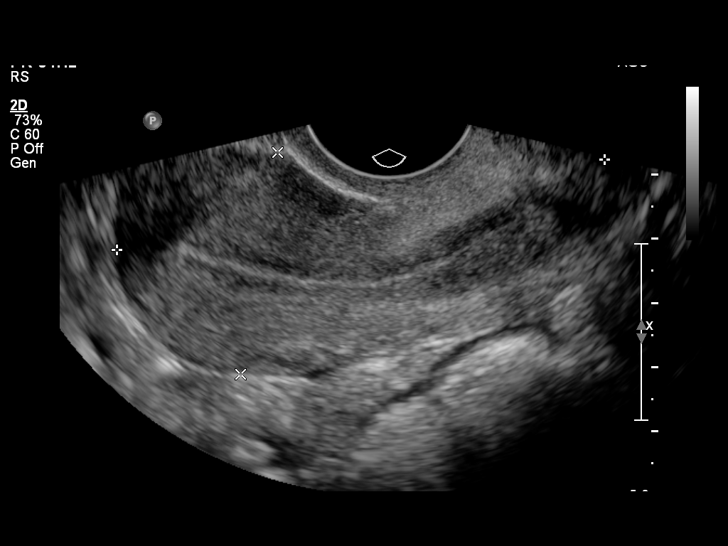
[im 14/41]
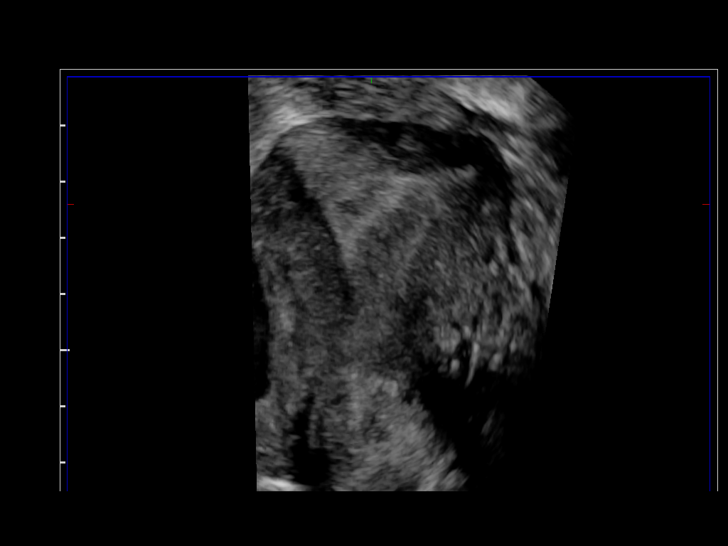
[im 17/41]
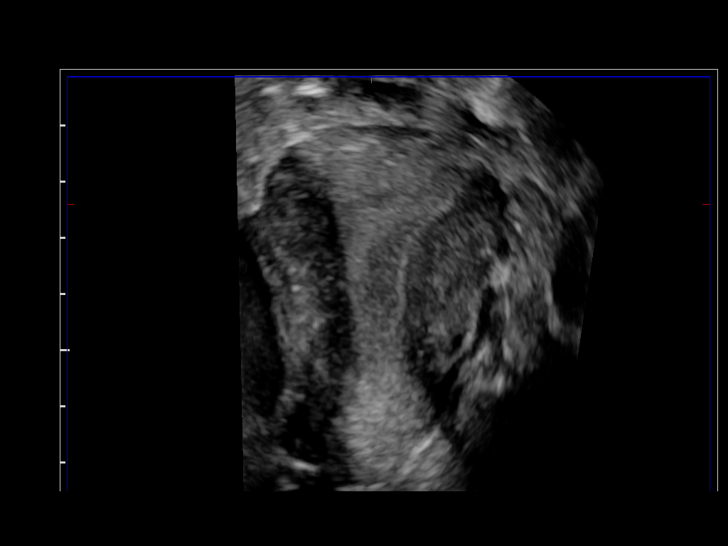
[im 21/41]
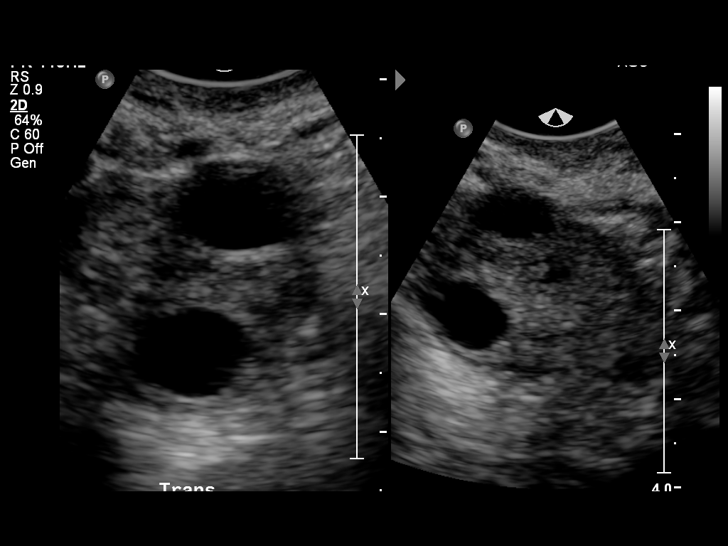
[im 24/41]
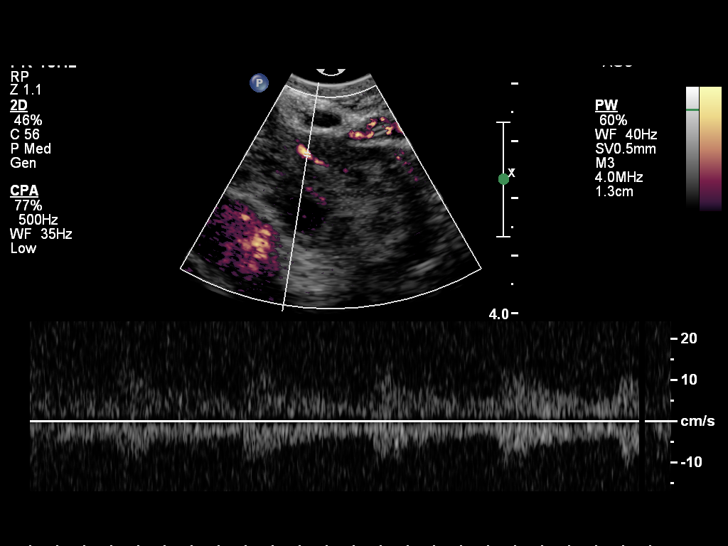
[im 27/41]
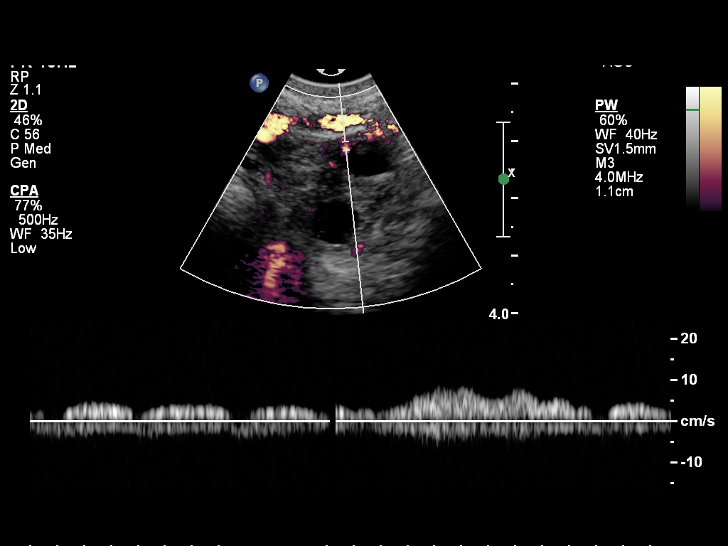
[im 31/41]
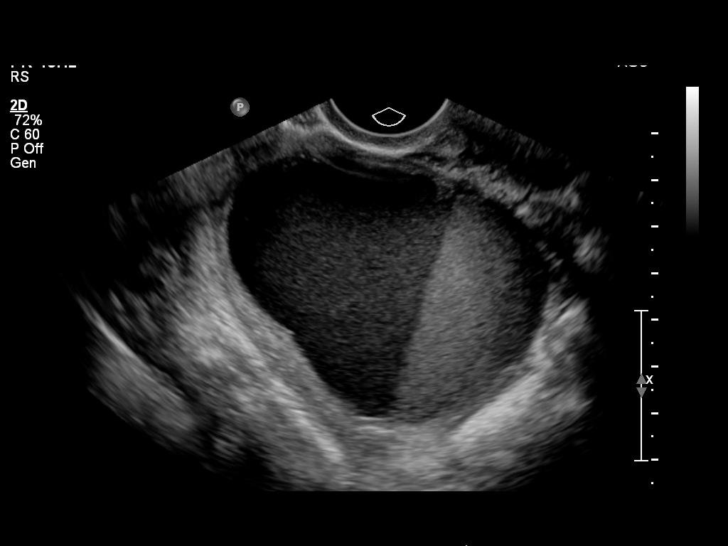
[im 34/41]
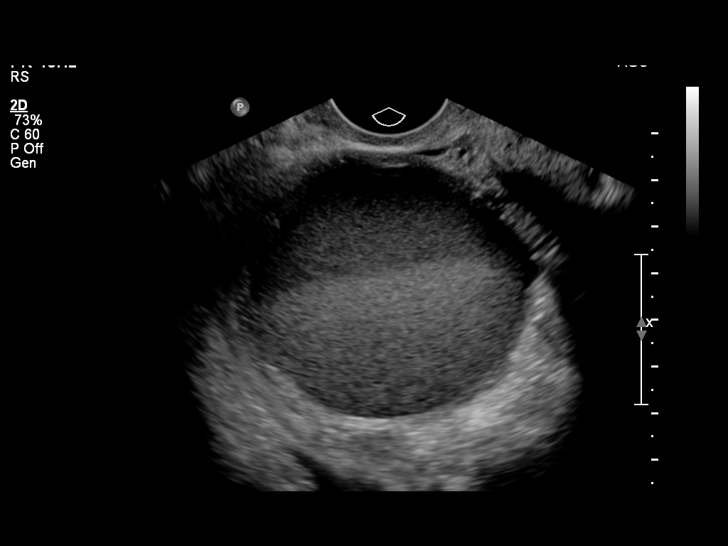
[im 37/41]
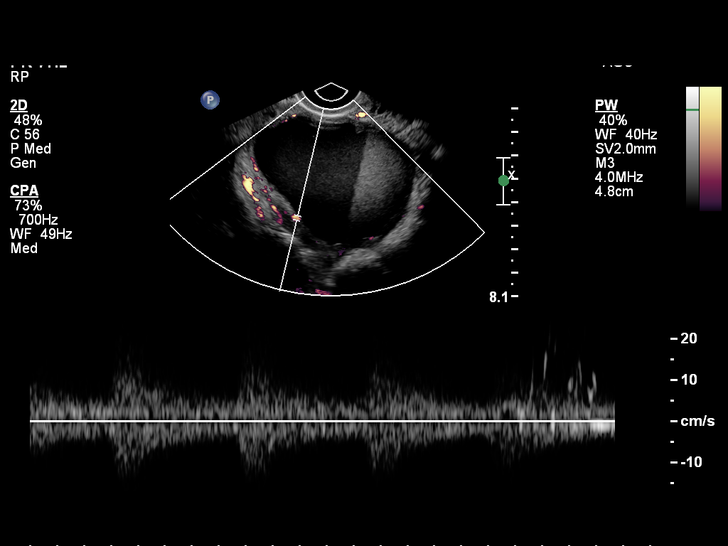
[im 41/41]
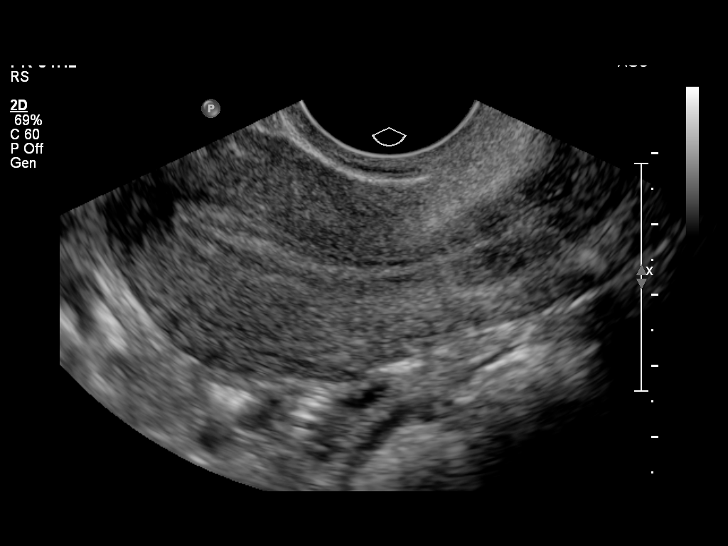

[13 of 25 positions shown; findings below may reference images not displayed]

FINDINGS: Uterus

Measurements: 7.7 x 3.5 x 5.2 cm.. Hypoechoic structure along the
anterior uterus measures 1.4 x 0.7 x 1.2 cm. This is most compatible
with a small uterine fibroid.

Endometrium

Thickness: 0.9 cm.  No focal abnormality visualized.

Right ovary

Measurements: 7.3 x 5.7 x 6.6 cm. Again noted is a complex
cystic-type structure in the right ovary that measures 6.9 x 5.1 x
6.1 cm. This structure has internal echoes and may have layering
hyperechoic fluid or material.

Left ovary

Measurements: Left ovary measures 2.9 x 1.9 x 1.5 cm. Normal
appearance the left ovary with small follicles.

Pulsed Doppler evaluation demonstrates normal low-resistance
arterial and venous waveforms in the left ovary. There is arterial
flow in the right ovary.
IMPRESSION: No evidence for ovarian torsion.

Again noted is a large complex cystic structure in the right ovary.
This structures has been present since at least 02/16/2013.
Differential would include endometrioma versus complex cyst.

Small uterine fibroid.

## 2015-09-13 DIAGNOSIS — F4312 Post-traumatic stress disorder, chronic: Secondary | ICD-10-CM | POA: Diagnosis not present

## 2015-09-13 DIAGNOSIS — F3111 Bipolar disorder, current episode manic without psychotic features, mild: Secondary | ICD-10-CM | POA: Diagnosis not present

## 2015-09-13 DIAGNOSIS — F4310 Post-traumatic stress disorder, unspecified: Secondary | ICD-10-CM | POA: Diagnosis not present

## 2015-09-13 DIAGNOSIS — F3110 Bipolar disorder, current episode manic without psychotic features, unspecified: Secondary | ICD-10-CM | POA: Diagnosis not present

## 2015-09-18 ENCOUNTER — Emergency Department (HOSPITAL_COMMUNITY): Payer: Medicare Other

## 2015-09-18 ENCOUNTER — Encounter (HOSPITAL_COMMUNITY): Payer: Self-pay | Admitting: Nurse Practitioner

## 2015-09-18 ENCOUNTER — Emergency Department (HOSPITAL_COMMUNITY)
Admission: EM | Admit: 2015-09-18 | Discharge: 2015-09-18 | Disposition: A | Discharge status: Home / Self Care | Payer: Medicare Other | Attending: Emergency Medicine | Admitting: Emergency Medicine

## 2015-09-18 DIAGNOSIS — M25511 Pain in right shoulder: Secondary | ICD-10-CM | POA: Diagnosis present

## 2015-09-18 DIAGNOSIS — J45909 Unspecified asthma, uncomplicated: Secondary | ICD-10-CM | POA: Insufficient documentation

## 2015-09-18 DIAGNOSIS — M542 Cervicalgia: Secondary | ICD-10-CM | POA: Diagnosis not present

## 2015-09-18 DIAGNOSIS — Z79899 Other long term (current) drug therapy: Secondary | ICD-10-CM | POA: Diagnosis not present

## 2015-09-18 DIAGNOSIS — F17210 Nicotine dependence, cigarettes, uncomplicated: Secondary | ICD-10-CM | POA: Diagnosis not present

## 2015-09-18 NOTE — ED Provider Notes (Signed)
CSN: [id number]     Arrival date & time 09/18/15  1225 History  By signing my name below, I, Rowan Blase, attest that this documentation has been prepared under the direction and in the presence of non-physician practitioner, Gloriann Loan, PA-C. Electronically Signed: Rowan Blase, Scribe. 09/18/2015. 1:45 PM.    Chief Complaint  Patient presents with  . Shoulder Pain   The history is provided by the patient. No language interpreter was used.   HPI Comments:  Catherine Olsen is a 45 y.o. female with PMHx of arthritis and fibromyalgia who presents to the Emergency Department complaining of constant burning right shoulder pain for the past 2 weeks. Pt reports associated sore "knot" on the right side of her neck which has been present for the past year, becoming enlarged in the past few weeks. She saw an orthopedist within the past year for the knot in her neck and was dx with a muscle spasm. Pt has tried massage without relief. Denies injury to shoulder, fever, or chills, numbness, weakness.    Past Medical History  Diagnosis Date  . Asthma   . Anxiety   . Mental disorder   . Bipolar 1 disorder ([place])   . Endometriosis   . PONV (postoperative nausea and vomiting)   . Termination of pregnancy     x 2 at age 3 and 45 yrs old  . Depression   . Headache(784.0)     otc med prn  . Arthritis     hands, lower back, knees  . Fibromyalgia   . Scoliosis   . Scoliosis   . Allergy   . 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 PTSD (post-traumatic stress disorder)    Past Surgical History  Procedure Laterality Date  . Nose surgery      rhinoplasty at age 54 yrs  . Wisdom tooth extraction    . Facial cosmetic surgery      right cheek  . Laparoscopy Right 11/11/2013    Procedure: LAPAROSCOPY OPERATIVE with  Drainage of  RIGHT Ovarian ENDOMETRIOMA;  Surgeon: Elveria Royals, MD;  Location: [facility];  Service: Gynecology;  Laterality: Right;  . Dilation and curettage of uterus    . Breast surgery      Family History  Problem Relation Age of Onset  . Diabetes Mother   . Hypertension Mother   . Stroke Mother 65    CVA  . Mental illness Mother     no diagnosis; personality disorder  . Hyperlipidemia Father   . Hypertension Father   . COPD Father   . Alpha-1 antitrypsin deficiency Father   . Diabetes Maternal Grandmother   . Heart disease Maternal Grandmother   . Hyperlipidemia Maternal Grandmother   . Hypertension Maternal Grandmother   . Mental illness Maternal Grandmother   . Heart disease Maternal Grandfather   . Hyperlipidemia Maternal Grandfather   . Hypertension Maternal Grandfather   . Heart disease Paternal Grandfather   . Hyperlipidemia Paternal Grandfather   . Hypertension Paternal Grandfather   . Stroke Paternal Grandfather   . Alpha-1 antitrypsin deficiency Brother    Social History  Substance Use Topics  . Smoking status: Current Every Day Smoker -- 0.00 packs/day for 29 years    Types: Cigarettes, E-cigarettes  . Smokeless tobacco: Never Used  . Alcohol Use: Yes     Comment: "   OB History    Gravida Para Term Preterm AB TAB SAB Ectopic Multiple Living   2    2  2    0     Review of Systems  Constitutional: Negative for fever and chills.  Musculoskeletal: Positive for myalgias, arthralgias and neck pain.    Allergies  Sulfamethoxazole; Oxycodone; Abilify; Penicillins; Other; Percocet; Sulfa antibiotics; and Sulfites  Home Medications   Prior to Admission medications   Medication Sig Start Date End Date Taking? Authorizing Provider  albuterol (PROVENTIL HFA;VENTOLIN HFA) 108 (90 Base) MCG/ACT inhaler Inhale 2 puffs into the lungs every 6 (six) hours as needed for wheezing or shortness of breath (cough, shortness of breath or wheezing.). 09/06/15   Chelle Jeffery, PA-C  alprazolam Duanne Moron) 2 MG tablet Take 2 mg by mouth 4 (four) times daily as needed for anxiety.     Historical Provider, MD  ARIPiprazole (ABILIFY) 5 MG tablet Take 7.5 mg by mouth every  evening.     Historical Provider, MD  beclomethasone (QVAR) 40 MCG/ACT inhaler Use two puffs twice daily with an asthma flare.  Rinse, gargle and spit with water after use. 04/19/15   Adelina Mings, MD  beclomethasone (QVAR) 40 MCG/ACT inhaler Use two puffs twice daily for an asthma flare.  Rinse, gargle and spit after use. 04/18/15   Adelina Mings, MD  Budesonide (PULMICORT FLEXHALER) 90 MCG/ACT inhaler Two puffs twice daily for asthma flare.  Rinse, gargle and spit with water after use. 04/18/15   Adelina Mings, MD  EPINEPHrine (EPIPEN 2-PAK) 0.3 mg/0.3 mL IJ SOAJ injection Inject 0.3 mLs (0.3 mg total) into the muscle once. 04/18/15   Adelina Mings, MD  fluticasone (FLOVENT HFA) 110 MCG/ACT inhaler Inhale 2 puffs into the lungs 2 (two) times daily. 04/24/15   Adelina Mings, MD  HYDROcodone-acetaminophen (NORCO/VICODIN) 5-325 MG tablet Take 1 tablet by mouth every 6 (six) hours as needed. 08/23/15   Wardell Honour, MD  ibuprofen (ADVIL,MOTRIN) 800 MG tablet Take 1 tablet (800 mg total) by mouth every 8 (eight) hours as needed. Patient taking differently: Take 800 mg by mouth every 8 (eight) hours as needed for headache or moderate pain.  07/26/15   Wardell Honour, MD  tretinoin (RETIN-A) 0.025 % cream Apply topically at bedtime. 09/06/15 09/05/16  Chelle Jeffery, PA-C  tretinoin (RETIN-A) 0.025 % cream Apply topically at bedtime. 09/10/15 09/09/16  Chelle Jeffery, PA-C   BP 95/69 mmHg  Pulse 71  Temp(Src) 97.9 F (36.6 C) (Oral)  Resp 19  Ht 5\' 8"  (1.727 m)  Wt 61.236 kg  BMI 20.53 kg/m2  SpO2 95%  LMP 11/21/2014   Physical Exam  Constitutional: She is oriented to person, place, and time. She appears well-developed and well-nourished.  Non-toxic appearance. She does not have a sickly appearance. She does not appear ill.  HENT:  Head: Normocephalic and atraumatic.  Right Ear: External ear normal.  Left Ear: External ear normal.  Mouth/Throat: Oropharynx is clear and  moist.  Eyes: Conjunctivae are normal. No scleral icterus.  Neck: Normal range of motion. Neck supple. No tracheal deviation present.  No cervical midline tenderness.  No b/l trapezius tenderness. 1 cm minimally tender, deep, soft, mobile mass on right trapezius.  No overlying erythema, induration, or fluctuance.  Cardiovascular: Intact distal pulses.   Pulses:      Radial pulses are 2+ on the right side, and 2+ on the left side.  Pulmonary/Chest: Effort normal and breath sounds normal. No accessory muscle usage or stridor. No respiratory distress. She has no wheezes. She has no rhonchi. She has no rales.  Abdominal: Soft.  Bowel sounds are normal. She exhibits no distension. There is no tenderness.  Musculoskeletal: Normal range of motion.  Right shoulder non-tender.  No swelling, erythema, or bruising.  FAROM without pain in flexion, extension, abduction, adduction, or internal/external rotation.  Lymphadenopathy:    She has no cervical adenopathy.  Neurological: She is alert and oriented to person, place, and time.  Sensation intact to light touch. 5/5 strength in b/l upper extremities.   Skin: Skin is warm and dry.  No signs of infection.   Psychiatric: She has a normal mood and affect. Her behavior is normal.    ED Course  Procedures  DIAGNOSTIC STUDIES:  Oxygen Saturation is 95% on RA, adequate by my interpretation.    COORDINATION OF CARE:  1:38 PM Discussed imaging options with pt at length. Informed pt we could start with an x-ray; if x-ray is negative will have pt follow-up with PCP for further evaluation. Informed pt CT scan wasn't necessitated today without indication from x-ray. Discussed treatment plan with pt at bedside and pt agreed to plan.  Labs Review Labs Reviewed - No data to display  Imaging Review No results found. I have personally reviewed and evaluated these images and lab results as part of my medical decision-making.   EKG Interpretation None       MDM   Final diagnoses:  Neck pain   Patient presents with mobile mass on right trapezius.  VSS, NAD.  No systemic symptoms.  Neurovascularly intact.  FAROM.  No signs of infection.  DDx trigger point vs lipoma vs bony abnormality.  Plain films not obtained, patient refused.  Patient repeatedly demanding MRI.  Lengthy discussion about use of resources in the emergency department and given her normal vitals and benign exam, there was no indication for further lab work or advanced imaging in the emergency department.  Discussed outpatient follow up with PCP for possible further evaluation of the area with MRI.  Patient was also seen by Dr. Regenia Skeeter who agrees with the above plan for further outpatient evaluation.  I personally performed the services described in this documentation, which was scribed in my presence. The recorded information has been reviewed and is accurate.    Gloriann Loan, PA-C 09/18/15 [place], MD 09/21/15 [phone]

## 2015-09-18 NOTE — ED Notes (Signed)
Patient returned to department after refusing x ray

## 2015-09-18 NOTE — ED Notes (Signed)
Pt instructed to call PCP for further workup.

## 2015-09-18 NOTE — ED Notes (Signed)
Patient transported to X-ray 

## 2015-09-18 NOTE — ED Notes (Addendum)
She c/o R shoulder pain x 1 week. Describes as a burning. She reports a "knot" in the R side of her neck over past year that she noticed is bigger this week, but she was told by an orthopedist last year that it was a muscle spasm. She has tried massage, neck exercises with no relief. She is alert and breathing easily

## 2015-09-18 NOTE — ED Notes (Signed)
While in xray, pt refused to have ordered xray. Returned to ED. Pt demanding to see "a doctor". PA spoke with Dr. Regenia Skeeter..." will see".

## 2015-09-18 NOTE — Discharge Instructions (Signed)
Please follow-up with your primary care doctor. Call them today. At this time, there is no indication for further emergent workup.   Your primary care doctor could potentially schedule an MRI for further evaluation. Return to the emergency department if you experience fever, chills, sudden increase in size, or redness.

## 2015-09-19 ENCOUNTER — Ambulatory Visit (INDEPENDENT_AMBULATORY_CARE_PROVIDER_SITE_OTHER): Payer: Medicare Other | Admitting: Family Medicine

## 2015-09-19 ENCOUNTER — Encounter: Payer: Self-pay | Admitting: Family Medicine

## 2015-09-19 VITALS — BP 112/76 | HR 63 | Temp 97.5°F | Resp 16 | Ht 67.5 in | Wt 135.2 lb

## 2015-09-19 DIAGNOSIS — M791 Myalgia: Secondary | ICD-10-CM

## 2015-09-19 DIAGNOSIS — M7918 Myalgia, other site: Secondary | ICD-10-CM

## 2015-09-19 MED ORDER — HYDROCODONE-ACETAMINOPHEN 5-325 MG PO TABS: 1.0000 | ORAL_TABLET | Freq: Four times a day (QID) | ORAL | Status: DC | PRN
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
## 2015-09-19 NOTE — Patient Instructions (Addendum)
Muscle Cramps and Spasms Muscle cramps and spasms occur when a muscle or muscles tighten and you have no control over this tightening (involuntary muscle contraction). They are a common problem and can develop in any muscle. The most common place is in the calf muscles of the leg. Both muscle cramps and muscle spasms are involuntary muscle contractions, but they also have differences:   Muscle cramps are sporadic and painful. They may last a few seconds to a quarter of an hour. Muscle cramps are often more forceful and last longer than muscle spasms.  Muscle spasms may or may not be painful. They may also last just a few seconds or much longer. CAUSES  It is uncommon for cramps or spasms to be due to a serious underlying problem. In many cases, the cause of cramps or spasms is unknown. Some common causes are:   Overexertion.   Overuse from repetitive motions (doing the same thing over and over).   Remaining in a certain position for a long period of time.   Improper preparation, form, or technique while performing a sport or activity.   Dehydration.   Injury.   Side effects of some medicines.   Abnormally low levels of the salts and ions in your blood (electrolytes), especially potassium and calcium. This could happen if you are taking water pills (diuretics) or you are pregnant.  Some underlying medical problems can make it more likely to develop cramps or spasms. These include, but are not limited to:   Diabetes.   Parkinson disease.   Hormone disorders, such as thyroid problems.   Alcohol abuse.   Diseases specific to muscles, joints, and bones.   Blood vessel disease where not enough blood is getting to the muscles.  HOME CARE INSTRUCTIONS   Stay well hydrated. Drink enough water and fluids to keep your urine clear or pale yellow.  It may be helpful to massage, stretch, and relax the affected muscle.  For tight or tense muscles, use a warm towel, heating  pad, or hot shower water directed to the affected area.  If you are sore or have pain after a cramp or spasm, applying ice to the affected area may relieve discomfort.  Put ice in a plastic bag.  Place a towel between your skin and the bag.  Leave the ice on for 15-20 minutes, 03-04 times a day.  Medicines used to treat a known cause of cramps or spasms may help reduce their frequency or severity. Only take over-the-counter or prescription medicines as directed by your caregiver. SEEK MEDICAL CARE IF:  Your cramps or spasms get more severe, more frequent, or do not improve over time.  MAKE SURE YOU:   Understand these instructions.  Will watch your condition.  Will get help right away if you are not doing well or get worse.   This information is not intended to replace advice given to you by your health care provider. Make sure you discuss any questions you have with your health care provider.   Document Released: 09/13/2001 Document Revised: 07/19/2012 Document Reviewed: 03/10/2012 Elsevier Interactive Patient Education 2016 [employer].     IF you received an x-ray today, you will receive an invoice from [facility]. Please contact [facility] at [phone] with questions or concerns regarding your invoice.   IF you received labwork today, you will receive an invoice from [employer]. Please contact [facility] at [phone] with questions or concerns regarding your invoice.   Our billing staff  will not be able to assist you with questions regarding bills from these companies.  You will be contacted with the lab results as soon as they are available. The fastest way to get your results is to activate your My Chart account. Instructions are located on the last page of this paperwork. If you have not heard from [place] regarding the results in 2 weeks, please contact this office.    Myofascial Pain Syndrome and Fibromyalgia Myofascial  pain syndrome and fibromyalgia are both pain disorders. This pain may be felt mainly in your muscles.   Myofascial pain syndrome:  Always has trigger points or tender points in the muscle that will cause pain when pressed. The pain may come and go.  Usually affects your neck, upper back, and shoulder areas. The pain often radiates into your arms and hands.  Fibromyalgia:  Has muscle pains and tenderness that come and go.  Is often associated with fatigue and sleep disturbances.  Has trigger points.  Tends to be long-lasting (chronic), but is not life-threatening. Fibromyalgia and myofascial pain are not the same. However, they often occur together. If you have both conditions, each can make the other worse. Both are common and can cause enough pain and fatigue to make day-to-day activities difficult.  CAUSES  The exact causes of fibromyalgia and myofascial pain are not known. People with certain gene types may be more likely to develop fibromyalgia. Some factors can be triggers for both conditions, such as:   Spine disorders.  Arthritis.  Severe injury (trauma) and other physical stressors.  Being under a lot of stress.  A medical illness. SIGNS AND SYMPTOMS  Fibromyalgia The main symptom of fibromyalgia is widespread pain and tenderness in your muscles. This can vary over time. Pain is sometimes described as stabbing, shooting, or burning. You may have tingling or numbness, too. You may also have sleep problems and fatigue. You may wake up feeling tired and groggy (fibro fog). Other symptoms may include:   Bowel and bladder problems.  Headaches.  Visual problems.  Problems with odors and noises.  Depression or mood changes.  Painful menstrual periods (dysmenorrhea).  Dry skin or eyes. Myofascial pain syndrome Symptoms of myofascial pain syndrome include:   Tight, ropy bands of muscle.   Uncomfortable sensations in muscular areas, such  as:  Aching.  Cramping.  Burning.  Numbness.  Tingling.   Muscle weakness.  Trouble moving certain muscles freely (range of motion). DIAGNOSIS  There are no specific tests to diagnose fibromyalgia or myofascial pain syndrome. Both can be hard to diagnose because their symptoms are common in many other conditions. Your health care provider may suspect one or both of these conditions based on your symptoms and medical history. Your health care provider will also do a physical exam.  The key to diagnosing fibromyalgia is having pain, fatigue, and other symptoms for more than three months that cannot be explained by another condition.  The key to diagnosing myofascial pain syndrome is finding trigger points in muscles that are tender and cause pain elsewhere in your body (referred pain). TREATMENT  Treating fibromyalgia and myofascial pain often requires a team of health care providers. This usually starts with your primary provider and a physical therapist. You may also find it helpful to work with alternative health care providers, such as massage therapists or acupuncturists. Treatment for fibromyalgia may include medicines. This may include nonsteroidal anti-inflammatory drugs (NSAIDs), along with other medicines.  Treatment for myofascial pain may also include:  NSAIDs.  Cooling and stretching of muscles.  Trigger point injections.  Sound wave (ultrasound) treatments to stimulate muscles. HOME CARE INSTRUCTIONS   Take medicines only as directed by your health care provider.  Exercise as directed by your health care provider or physical therapist.  Try to avoid stressful situations.  Practice relaxation techniques to control your stress. You may want to try:  Biofeedback.  Visual imagery.  Hypnosis.  Muscle relaxation.  Yoga.  Meditation.  Talk to your health care provider about alternative treatments, such as acupuncture or massage treatment.  Maintain a  healthy lifestyle. This includes eating a healthy diet and getting enough sleep.  Consider joining a support group.  Do not do activities that stress or strain your muscles. That includes repetitive motions and heavy lifting. SEEK MEDICAL CARE IF:   You have new symptoms.  Your symptoms get worse.  You have side effects from your medicines.  You have trouble sleeping.  Your condition is causing depression or anxiety. FOR MORE INFORMATION   National Fibromyalgia Association: http://www.fmaware.orgwww.fmaware.[place]: http://www.arthritis.orgwww.arthritis.org  American Chronic Pain Association: StreetWrestling.at.https://stevens.biz/   This information is not intended to replace advice given to you by your health care provider. Make sure you discuss any questions you have with your health care provider.   Document Released: 03/24/2005 Document Revised: 04/14/2014 Document Reviewed: 12/28/2013 Elsevier Interactive Patient Education [employer].

## 2015-09-27 DIAGNOSIS — F4310 Post-traumatic stress disorder, unspecified: Secondary | ICD-10-CM | POA: Diagnosis not present

## 2015-09-27 DIAGNOSIS — F3110 Bipolar disorder, current episode manic without psychotic features, unspecified: Secondary | ICD-10-CM | POA: Diagnosis not present

## 2015-10-10 ENCOUNTER — Other Ambulatory Visit: Payer: Self-pay | Admitting: Physician Assistant

## 2015-10-11 ENCOUNTER — Encounter: Payer: Self-pay | Admitting: Family Medicine

## 2015-10-11 DIAGNOSIS — F4310 Post-traumatic stress disorder, unspecified: Secondary | ICD-10-CM | POA: Diagnosis not present

## 2015-10-11 DIAGNOSIS — F3110 Bipolar disorder, current episode manic without psychotic features, unspecified: Secondary | ICD-10-CM | POA: Diagnosis not present

## 2015-10-15 DIAGNOSIS — Z780 Asymptomatic menopausal state: Secondary | ICD-10-CM | POA: Diagnosis not present

## 2015-10-16 DIAGNOSIS — F4312 Post-traumatic stress disorder, chronic: Secondary | ICD-10-CM | POA: Diagnosis not present

## 2015-10-16 DIAGNOSIS — F3111 Bipolar disorder, current episode manic without psychotic features, mild: Secondary | ICD-10-CM | POA: Diagnosis not present

## 2015-10-18 ENCOUNTER — Encounter: Payer: Self-pay | Admitting: Family Medicine

## 2015-10-25 DIAGNOSIS — F3110 Bipolar disorder, current episode manic without psychotic features, unspecified: Secondary | ICD-10-CM | POA: Diagnosis not present

## 2015-10-25 DIAGNOSIS — F4310 Post-traumatic stress disorder, unspecified: Secondary | ICD-10-CM | POA: Diagnosis not present

## 2015-11-01 DIAGNOSIS — F3110 Bipolar disorder, current episode manic without psychotic features, unspecified: Secondary | ICD-10-CM | POA: Diagnosis not present

## 2015-11-01 DIAGNOSIS — F4310 Post-traumatic stress disorder, unspecified: Secondary | ICD-10-CM | POA: Diagnosis not present

## 2015-11-07 ENCOUNTER — Other Ambulatory Visit: Payer: Self-pay | Admitting: Physician Assistant

## 2015-11-09 DIAGNOSIS — F4310 Post-traumatic stress disorder, unspecified: Secondary | ICD-10-CM | POA: Diagnosis not present

## 2015-11-09 DIAGNOSIS — F3110 Bipolar disorder, current episode manic without psychotic features, unspecified: Secondary | ICD-10-CM | POA: Diagnosis not present

## 2015-11-14 DIAGNOSIS — F3111 Bipolar disorder, current episode manic without psychotic features, mild: Secondary | ICD-10-CM | POA: Diagnosis not present

## 2015-11-29 DIAGNOSIS — F4310 Post-traumatic stress disorder, unspecified: Secondary | ICD-10-CM | POA: Diagnosis not present

## 2015-11-29 DIAGNOSIS — F3110 Bipolar disorder, current episode manic without psychotic features, unspecified: Secondary | ICD-10-CM | POA: Diagnosis not present

## 2015-12-05 ENCOUNTER — Other Ambulatory Visit: Payer: Self-pay | Admitting: General Surgery

## 2015-12-05 DIAGNOSIS — N6001 Solitary cyst of right breast: Secondary | ICD-10-CM

## 2015-12-06 DIAGNOSIS — F4310 Post-traumatic stress disorder, unspecified: Secondary | ICD-10-CM | POA: Diagnosis not present

## 2015-12-06 DIAGNOSIS — N938 Other specified abnormal uterine and vaginal bleeding: Secondary | ICD-10-CM | POA: Diagnosis not present

## 2015-12-06 DIAGNOSIS — F3110 Bipolar disorder, current episode manic without psychotic features, unspecified: Secondary | ICD-10-CM | POA: Diagnosis not present

## 2015-12-11 ENCOUNTER — Other Ambulatory Visit: Payer: Self-pay | Admitting: Allergy and Immunology

## 2015-12-11 MED ORDER — ALBUTEROL SULFATE HFA 108 (90 BASE) MCG/ACT IN AERS: 2.0000 | INHALATION_SPRAY | Freq: Four times a day (QID) | RESPIRATORY_TRACT | 0 refills | Status: DC | PRN

## 2015-12-11 NOTE — Telephone Encounter (Signed)
Pt called and needs to have a ventolin hfa called into [facility]. [phone].

## 2015-12-11 NOTE — Telephone Encounter (Signed)
Called patient states insurance covers Ventolin hfa sent in to pharmacy advise to make appt for further refills. Patient verbalizes understanding

## 2015-12-12 DIAGNOSIS — F3111 Bipolar disorder, current episode manic without psychotic features, mild: Secondary | ICD-10-CM | POA: Diagnosis not present

## 2015-12-12 DIAGNOSIS — F4312 Post-traumatic stress disorder, chronic: Secondary | ICD-10-CM | POA: Diagnosis not present

## 2015-12-13 DIAGNOSIS — F3110 Bipolar disorder, current episode manic without psychotic features, unspecified: Secondary | ICD-10-CM | POA: Diagnosis not present

## 2015-12-13 DIAGNOSIS — F4310 Post-traumatic stress disorder, unspecified: Secondary | ICD-10-CM | POA: Diagnosis not present

## 2015-12-20 DIAGNOSIS — F3110 Bipolar disorder, current episode manic without psychotic features, unspecified: Secondary | ICD-10-CM | POA: Diagnosis not present

## 2015-12-20 DIAGNOSIS — F4310 Post-traumatic stress disorder, unspecified: Secondary | ICD-10-CM | POA: Diagnosis not present

## 2015-12-24 ENCOUNTER — Ambulatory Visit: Payer: Medicare Other

## 2015-12-26 ENCOUNTER — Ambulatory Visit (INDEPENDENT_AMBULATORY_CARE_PROVIDER_SITE_OTHER): Payer: Medicare Other | Admitting: Family Medicine

## 2015-12-26 ENCOUNTER — Encounter: Payer: Self-pay | Admitting: Family Medicine

## 2015-12-26 VITALS — BP 114/70 | HR 85 | Temp 98.0°F

## 2015-12-26 DIAGNOSIS — G43709 Chronic migraine without aura, not intractable, without status migrainosus: Secondary | ICD-10-CM | POA: Diagnosis not present

## 2015-12-26 MED ORDER — EPINEPHRINE 0.3 MG/0.3ML IJ SOAJ: 0.3000 mg | Freq: Once | INTRAMUSCULAR | 1 refills | Status: AC

## 2015-12-26 MED ORDER — ALBUTEROL SULFATE HFA 108 (90 BASE) MCG/ACT IN AERS: 2.0000 | INHALATION_SPRAY | Freq: Four times a day (QID) | RESPIRATORY_TRACT | 0 refills | Status: DC | PRN

## 2015-12-26 MED ORDER — HYDROCODONE-ACETAMINOPHEN 5-325 MG PO TABS: 1.0000 | ORAL_TABLET | Freq: Four times a day (QID) | ORAL | 0 refills | Status: DC | PRN

## 2015-12-26 NOTE — Progress Notes (Signed)
Subjective:    Patient ID: Catherine Olsen, female    DOB: July 15, 1970, 45 y.o.   MRN: [id number]  12/26/2015  Headache (migraine for the past 4 days)   HPI This 45 y.o. female presents for evaluation of headaches.  Onset four days ago.  Chronic migraines throughout life. Last hydrocodone three weeks ago.  Mother would not let pt drive until today.  R sided frontal headache and periorbital region.  +photophobia.  +phonophobia; +nausea; +vomiting but has decreased in frequency in past two days.  +blurry vision which is typical for migraine.  +dizziness which is typical.  Has required increased bipolar medications in past two months; suffers with side effects including dizziness.  No n/t/focal weakness.  Typical migraine for patient.  Severity 7.5/10 today.  Earlier in week, 9/10.  Ibuprofen 800mg  tid without improvement.  No nausea medications; does not do phenergan or any injections.     Review of Systems  Constitutional: Negative for chills, diaphoresis, fatigue and fever.  Eyes: Positive for photophobia. Negative for visual disturbance.  Respiratory: Negative for cough and shortness of breath.   Cardiovascular: Negative for chest pain, palpitations and leg swelling.  Gastrointestinal: Positive for nausea and vomiting. Negative for abdominal pain, constipation and diarrhea.  Endocrine: Negative for cold intolerance, heat intolerance, polydipsia, polyphagia and polyuria.  Neurological: Positive for dizziness and headaches. Negative for tremors, seizures, syncope, facial asymmetry, speech difficulty, weakness, light-headedness and numbness.    Past Medical History:  Diagnosis Date  . Allergy   . Anxiety   . Arthritis    hands, lower back, knees  . Asthma   . Bipolar 1 disorder ([place])   . Depression   . Endometriosis   . Fibromyalgia   . Headache(784.0)    otc med prn  . Mental disorder   . Pituitary tumor ([place])   . PONV (postoperative nausea and vomiting)   . PTSD (post-traumatic  stress disorder)   . Scoliosis   . Scoliosis   . Termination of pregnancy    x 2 at age 11 and 45 yrs old   Past Surgical History:  Procedure Laterality Date  . BREAST SURGERY    . DILATION AND CURETTAGE OF UTERUS    . FACIAL COSMETIC SURGERY     right cheek  . LAPAROSCOPY Right 11/11/2013   Procedure: LAPAROSCOPY OPERATIVE with  Drainage of  RIGHT Ovarian ENDOMETRIOMA;  Surgeon: Elveria Royals, MD;  Location: [facility];  Service: Gynecology;  Laterality: Right;  . NOSE SURGERY     rhinoplasty at age 32 yrs  . WISDOM TOOTH EXTRACTION     Allergies  Allergen Reactions  . Sulfamethoxazole Anaphylaxis  . Oxycodone Other (See Comments)    Severe blindness?  . Abilify [Aripiprazole]     Generic Abilify--causes severe neurological problems. Can use name brand  . Penicillins Hives    Has patient had a PCN reaction causing immediate rash, facial/tongue/throat swelling, SOB or lightheadedness with hypotension: no Has patient had a PCN reaction causing severe rash involving mucus membranes or skin necrosis: NO Has patient had a PCN reaction that required hospitalizationNO Has patient had a PCN reaction occurring within the last 10 years:NO If all of the above answers are "NO", then may proceed with Cephalosporin use.   . Other Anxiety    steroids  . Percocet [Oxycodone-Acetaminophen] Palpitations  . Sulfa Antibiotics Hives  . Sulfites Other (See Comments)    unknown   Current Outpatient Prescriptions  Medication Sig Dispense Refill  .  albuterol (VENTOLIN HFA) 108 (90 Base) MCG/ACT inhaler Inhale 2 puffs into the lungs every 6 (six) hours as needed for wheezing or shortness of breath. 1 Inhaler 0  . alprazolam (XANAX) 2 MG tablet Take 2 mg by mouth 4 (four) times daily as needed for anxiety.     . ARIPiprazole (ABILIFY) 5 MG tablet Take 7.5 mg by mouth every evening.     Marland Kitchen. EPINEPHrine (EPIPEN 2-PAK) 0.3 mg/0.3 mL IJ SOAJ injection Inject 0.3 mLs (0.3 mg total) into the muscle once. 2  Device 1  . HYDROcodone-acetaminophen (NORCO/VICODIN) 5-325 MG tablet Take 1 tablet by mouth every 6 (six) hours as needed. 20 tablet 0  . tretinoin (RETIN-A) 0.025 % cream Apply topically at bedtime. 20 g 1   Current Facility-Administered Medications  Medication Dose Route Frequency Provider Last Rate Last Dose  . ibuprofen (ADVIL,MOTRIN) tablet 800 mg  800 mg Oral Once Thao P Le, DO       Social History   Social History  . Marital status: Divorced    Spouse name: N/A  . Number of children: 0  . Years of education: N/A   Occupational History  . disability     mental illness   Social History Main Topics  . Smoking status: Current Every Day Smoker    Packs/day: 0.00    Years: 29.00    Types: Cigarettes, E-cigarettes  . Smokeless tobacco: Never Used  . Alcohol use Yes     Comment: "  . Drug use:     Types: "Crack" cocaine, Cocaine     Comment: Heavy user per patient, last use Tuesday 11/08/13  LAST CRACK  2015  . Sexual activity: No     Comment: abortion at [age] and [age]yrs. of age    Other Topics Concern  . Not on file   Social History Narrative   Marital status: divorced; not dating      Children: none      Lives: alone with mom      Employment: disability for mental illness in 1997.  Hospitalizations x 9 in past.      Tobacco: 1 ppd x since age 45.      Alcohol: socially; beers.      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      Family History  Problem Relation Age of Onset  . Diabetes Mother   . Hypertension Mother   . Stroke Mother 5562    CVA  . Mental illness Mother     no diagnosis; personality disorder  . Hyperlipidemia Father   . Hypertension Father   . COPD Father   . Alpha-1 antitrypsin deficiency Father   . Diabetes Maternal Grandmother   . Heart disease Maternal Grandmother   . Hyperlipidemia Maternal Grandmother   . Hypertension Maternal Grandmother   . Mental illness Maternal Grandmother   . Heart disease Maternal Grandfather     . Hyperlipidemia Maternal Grandfather   . Hypertension Maternal Grandfather   . Heart disease Paternal Grandfather   . Hyperlipidemia Paternal Grandfather   . Hypertension Paternal Grandfather   . Stroke Paternal Grandfather   . Alpha-1 antitrypsin deficiency Brother        Objective:    BP 114/70   Pulse 85   Temp 98 F (36.7 C) (Oral)   LMP 11/21/2014  Physical Exam  Constitutional: She is oriented to person, place, and time. She appears well-developed and well-nourished. No distress.  Examination room lights off upon  entering the room.  HENT:  Head: Normocephalic and atraumatic.  Right Ear: External ear normal.  Left Ear: External ear normal.  Nose: Nose normal.  Mouth/Throat: Oropharynx is clear and moist.  Eyes: Conjunctivae and EOM are normal. Pupils are equal, round, and reactive to light.  Neck: Normal range of motion. Neck supple. Carotid bruit is not present. No thyromegaly present.  Cardiovascular: Normal rate, regular rhythm, normal heart sounds and intact distal pulses.  Exam reveals no gallop and no friction rub.   No murmur heard. Pulmonary/Chest: Effort normal and breath sounds normal. She has no wheezes. She has no rales.  Abdominal: Soft. Bowel sounds are normal. She exhibits no distension and no mass. There is no tenderness. There is no rebound and no guarding.  Lymphadenopathy:    She has no cervical adenopathy.  Neurological: She is alert and oriented to person, place, and time. No cranial nerve deficit. She exhibits abnormal muscle tone. Coordination normal.  Skin: Skin is warm and dry. No rash noted. She is not diaphoretic. No erythema. No pallor.  Psychiatric: She has a normal mood and affect. Her behavior is normal.  Nursing note and vitals reviewed.       Assessment & Plan:   1. Chronic migraine without aura without status migrainosus, not intractable    -New/recurrent. -pt declined Toradol injection in office. -rx for Hydrocodone provided;  continue Ibuprofen 800mg  PRN. -normal neurological exam in office; this migraine is consistent with prior migraines.   No orders of the defined types were placed in this encounter.  Meds ordered this encounter  Medications  . HYDROcodone-acetaminophen (NORCO/VICODIN) 5-325 MG tablet    Sig: Take 1 tablet by mouth every 6 (six) hours as needed.    Dispense:  20 tablet    Refill:  0    No Follow-up on file.   Haruka Kowaleski Elayne Guerin, M.D. [facility] [address] [place], [place]  [address] [phone] phone [phone] fax

## 2015-12-26 NOTE — Patient Instructions (Addendum)
IF you received an x-ray today, you will receive an invoice from [facility]. Please contact [facility] at [phone] with questions or concerns regarding your invoice.   IF you received labwork today, you will receive an invoice from [employer]. Please contact [facility] at [phone] with questions or concerns regarding your invoice.   Our billing staff will not be able to assist you with questions regarding bills from these companies.  You will be contacted with the lab results as soon as they are available. The fastest way to get your results is to activate your My Chart account. Instructions are located on the last page of this paperwork. If you have not heard from [place] regarding the results in 2 weeks, please contact this office.      Migraine Headache A migraine headache is an intense, throbbing pain on one or both sides of your head. A migraine can last for 30 minutes to several hours. CAUSES  The exact cause of a migraine headache is not always known. However, a migraine may be caused when nerves in the brain become irritated and release chemicals that cause inflammation. This causes pain. Certain things may also trigger migraines, such as:  Alcohol.  Smoking.  Stress.  Menstruation.  Aged cheeses.  Foods or drinks that contain nitrates, glutamate, aspartame, or tyramine.  Lack of sleep.  Chocolate.  Caffeine.  Hunger.  Physical exertion.  Fatigue.  Medicines used to treat chest pain (nitroglycerine), birth control pills, estrogen, and some blood pressure medicines. SIGNS AND SYMPTOMS  Pain on one or both sides of your head.  Pulsating or throbbing pain.  Severe pain that prevents daily activities.  Pain that is aggravated by any physical activity.  Nausea, vomiting, or both.  Dizziness.  Pain with exposure to bright lights, loud noises, or activity.  General sensitivity to bright lights, loud  noises, or smells. Before you get a migraine, you may get warning signs that a migraine is coming (aura). An aura may include:  Seeing flashing lights.  Seeing bright spots, halos, or zigzag lines.  Having tunnel vision or blurred vision.  Having feelings of numbness or tingling.  Having trouble talking.  Having muscle weakness. DIAGNOSIS  A migraine headache is often diagnosed based on:  Symptoms.  Physical exam.  A CT scan or MRI of your head. These imaging tests cannot diagnose migraines, but they can help rule out other causes of headaches. TREATMENT Medicines may be given for pain and nausea. Medicines can also be given to help prevent recurrent migraines.  HOME CARE INSTRUCTIONS  Only take over-the-counter or prescription medicines for pain or discomfort as directed by your health care provider. The use of long-term narcotics is not recommended.  Lie down in a dark, quiet room when you have a migraine.  Keep a journal to find out what may trigger your migraine headaches. For example, write down:  What you eat and drink.  How much sleep you get.  Any change to your diet or medicines.  Limit alcohol consumption.  Quit smoking if you smoke.  Get 7-9 hours of sleep, or as recommended by your health care provider.  Limit stress.  Keep lights dim if bright lights bother you and make your migraines worse. SEEK IMMEDIATE MEDICAL CARE IF:   Your migraine becomes severe.  You have a fever.  You have a stiff neck.  You have vision loss.  You have muscular weakness or loss of muscle control.  You  start losing your balance or have trouble walking.  You feel faint or pass out.  You have severe symptoms that are different from your first symptoms. MAKE SURE YOU:   Understand these instructions.  Will watch your condition.  Will get help right away if you are not doing well or get worse.   This information is not intended to replace advice given to you by  your health care provider. Make sure you discuss any questions you have with your health care provider.   Document Released: 03/24/2005 Document Revised: 04/14/2014 Document Reviewed: 11/29/2012 Elsevier Interactive Patient Education [employer].

## 2016-01-04 ENCOUNTER — Other Ambulatory Visit: Payer: Self-pay | Admitting: General Surgery

## 2016-01-04 DIAGNOSIS — N801 Endometriosis of ovary: Secondary | ICD-10-CM | POA: Diagnosis not present

## 2016-01-04 DIAGNOSIS — N6320 Unspecified lump in the left breast, unspecified quadrant: Secondary | ICD-10-CM

## 2016-01-04 DIAGNOSIS — Z3141 Encounter for fertility testing: Secondary | ICD-10-CM | POA: Diagnosis not present

## 2016-01-07 ENCOUNTER — Ambulatory Visit
Admission: RE | Admit: 2016-01-07 | Discharge: 2016-01-07 | Disposition: A | Discharge status: Home / Self Care | Payer: Medicare Other | Source: Ambulatory Visit | Attending: General Surgery | Admitting: General Surgery

## 2016-01-07 ENCOUNTER — Other Ambulatory Visit: Payer: Self-pay | Admitting: General Surgery

## 2016-01-07 DIAGNOSIS — N6001 Solitary cyst of right breast: Secondary | ICD-10-CM

## 2016-01-07 DIAGNOSIS — N6321 Unspecified lump in the left breast, upper outer quadrant: Secondary | ICD-10-CM | POA: Diagnosis not present

## 2016-01-07 DIAGNOSIS — N6320 Unspecified lump in the left breast, unspecified quadrant: Secondary | ICD-10-CM | POA: Diagnosis not present

## 2016-01-07 DIAGNOSIS — R922 Inconclusive mammogram: Secondary | ICD-10-CM | POA: Diagnosis not present

## 2016-01-09 ENCOUNTER — Other Ambulatory Visit: Payer: Medicare Other

## 2016-01-10 DIAGNOSIS — F3110 Bipolar disorder, current episode manic without psychotic features, unspecified: Secondary | ICD-10-CM | POA: Diagnosis not present

## 2016-01-10 DIAGNOSIS — F4310 Post-traumatic stress disorder, unspecified: Secondary | ICD-10-CM | POA: Diagnosis not present

## 2016-01-11 ENCOUNTER — Other Ambulatory Visit: Payer: Self-pay | Admitting: Family Medicine

## 2016-01-12 ENCOUNTER — Telehealth: Payer: Self-pay

## 2016-01-12 ENCOUNTER — Other Ambulatory Visit: Payer: Self-pay | Admitting: Radiology

## 2016-01-12 NOTE — Telephone Encounter (Signed)
Patient is calling because she wants to know why the request for Advil was denied since she was just here. Patient states that her insurance covers the cost of the medication and would prefer a prescription to be sent in.  Please advise! [phone]

## 2016-01-12 NOTE — Telephone Encounter (Signed)
I spoke to patient, she wants Rx for Ibuprofen, I think so her Flex spending or insurance will cover this, ok to send this in for her ? Patient states she needs this as soon as possible.

## 2016-01-13 ENCOUNTER — Other Ambulatory Visit: Payer: Self-pay | Admitting: Family Medicine

## 2016-01-14 DIAGNOSIS — N801 Endometriosis of ovary: Secondary | ICD-10-CM | POA: Diagnosis not present

## 2016-01-15 MED ORDER — IBUPROFEN 800 MG PO TABS: 800.0000 mg | ORAL_TABLET | Freq: Three times a day (TID) | ORAL | 0 refills | Status: DC | PRN

## 2016-01-15 NOTE — Telephone Encounter (Signed)
Rx for Ibuprofen approved.

## 2016-01-16 DIAGNOSIS — F3132 Bipolar disorder, current episode depressed, moderate: Secondary | ICD-10-CM | POA: Diagnosis not present

## 2016-01-16 DIAGNOSIS — F4312 Post-traumatic stress disorder, chronic: Secondary | ICD-10-CM | POA: Diagnosis not present

## 2016-01-16 DIAGNOSIS — F4323 Adjustment disorder with mixed anxiety and depressed mood: Secondary | ICD-10-CM | POA: Diagnosis not present

## 2016-01-21 ENCOUNTER — Ambulatory Visit
Admission: RE | Admit: 2016-01-21 | Discharge: 2016-01-21 | Disposition: A | Discharge status: Home / Self Care | Payer: Medicare Other | Source: Ambulatory Visit | Attending: General Surgery | Admitting: General Surgery

## 2016-01-21 DIAGNOSIS — N6001 Solitary cyst of right breast: Secondary | ICD-10-CM

## 2016-01-21 DIAGNOSIS — N6320 Unspecified lump in the left breast, unspecified quadrant: Secondary | ICD-10-CM | POA: Diagnosis not present

## 2016-01-24 DIAGNOSIS — F3110 Bipolar disorder, current episode manic without psychotic features, unspecified: Secondary | ICD-10-CM | POA: Diagnosis not present

## 2016-01-24 DIAGNOSIS — F4310 Post-traumatic stress disorder, unspecified: Secondary | ICD-10-CM | POA: Diagnosis not present

## 2016-01-27 ENCOUNTER — Encounter (HOSPITAL_COMMUNITY): Payer: Self-pay

## 2016-01-27 ENCOUNTER — Emergency Department (HOSPITAL_COMMUNITY)
Admission: EM | Admit: 2016-01-27 | Discharge: 2016-01-28 | Disposition: A | Discharge status: Home / Self Care | Payer: Medicare Other | Attending: Emergency Medicine | Admitting: Emergency Medicine

## 2016-01-27 DIAGNOSIS — F17210 Nicotine dependence, cigarettes, uncomplicated: Secondary | ICD-10-CM | POA: Diagnosis not present

## 2016-01-27 DIAGNOSIS — Z79899 Other long term (current) drug therapy: Secondary | ICD-10-CM | POA: Insufficient documentation

## 2016-01-27 DIAGNOSIS — R590 Localized enlarged lymph nodes: Secondary | ICD-10-CM | POA: Diagnosis not present

## 2016-01-27 DIAGNOSIS — J45909 Unspecified asthma, uncomplicated: Secondary | ICD-10-CM | POA: Diagnosis not present
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
## 2016-01-27 NOTE — Discharge Instructions (Signed)
Follow-up with your primary care provider and the ears nose and throat specialist tomorrow to schedule an appointment for this week. Return immediately to the emergency department if you experience increased swelling of her lymph nodes, swelling of your throat, difficulty swallowing or breathing, fever, or any other concerning symptoms.

## 2016-01-27 NOTE — ED Triage Notes (Signed)
Patient had cold and sore throat several days ago and now notices enlarged lymph nodes to left side of neck, non-tender and no swelling

## 2016-01-29 DIAGNOSIS — R590 Localized enlarged lymph nodes: Secondary | ICD-10-CM | POA: Insufficient documentation

## 2016-02-04 ENCOUNTER — Other Ambulatory Visit: Payer: Medicare Other

## 2016-02-07 DIAGNOSIS — F4310 Post-traumatic stress disorder, unspecified: Secondary | ICD-10-CM | POA: Diagnosis not present

## 2016-02-07 DIAGNOSIS — F3110 Bipolar disorder, current episode manic without psychotic features, unspecified: Secondary | ICD-10-CM | POA: Diagnosis not present

## 2016-02-18 DIAGNOSIS — F4323 Adjustment disorder with mixed anxiety and depressed mood: Secondary | ICD-10-CM | POA: Diagnosis not present

## 2016-02-18 DIAGNOSIS — F3132 Bipolar disorder, current episode depressed, moderate: Secondary | ICD-10-CM | POA: Diagnosis not present

## 2016-02-21 DIAGNOSIS — F3110 Bipolar disorder, current episode manic without psychotic features, unspecified: Secondary | ICD-10-CM | POA: Diagnosis not present

## 2016-02-21 DIAGNOSIS — F4310 Post-traumatic stress disorder, unspecified: Secondary | ICD-10-CM | POA: Diagnosis not present

## 2016-02-27 DIAGNOSIS — F3110 Bipolar disorder, current episode manic without psychotic features, unspecified: Secondary | ICD-10-CM | POA: Diagnosis not present

## 2016-02-27 DIAGNOSIS — F4310 Post-traumatic stress disorder, unspecified: Secondary | ICD-10-CM | POA: Diagnosis not present

## 2016-03-06 DIAGNOSIS — F3110 Bipolar disorder, current episode manic without psychotic features, unspecified: Secondary | ICD-10-CM | POA: Diagnosis not present

## 2016-03-06 DIAGNOSIS — F4310 Post-traumatic stress disorder, unspecified: Secondary | ICD-10-CM | POA: Diagnosis not present

## 2016-03-20 DIAGNOSIS — F3110 Bipolar disorder, current episode manic without psychotic features, unspecified: Secondary | ICD-10-CM | POA: Diagnosis not present

## 2016-03-20 DIAGNOSIS — F4310 Post-traumatic stress disorder, unspecified: Secondary | ICD-10-CM | POA: Diagnosis not present

## 2016-03-26 DIAGNOSIS — F4310 Post-traumatic stress disorder, unspecified: Secondary | ICD-10-CM | POA: Diagnosis not present

## 2016-03-26 DIAGNOSIS — F3110 Bipolar disorder, current episode manic without psychotic features, unspecified: Secondary | ICD-10-CM | POA: Diagnosis not present

## 2016-04-04 ENCOUNTER — Other Ambulatory Visit: Payer: Self-pay | Admitting: Family Medicine

## 2016-04-05 NOTE — Telephone Encounter (Signed)
Pt needs a refill on her inhaler and says it needs to be ventolin inhaler so that her insurance will pay for it. [phone]  Says the pharmacy has sent two requests and [facility] at [facility].

## 2016-04-11 DIAGNOSIS — N951 Menopausal and female climacteric states: Secondary | ICD-10-CM | POA: Diagnosis not present

## 2016-04-16 DIAGNOSIS — F3132 Bipolar disorder, current episode depressed, moderate: Secondary | ICD-10-CM | POA: Diagnosis not present

## 2016-04-16 DIAGNOSIS — F4312 Post-traumatic stress disorder, chronic: Secondary | ICD-10-CM | POA: Diagnosis not present

## 2016-04-17 DIAGNOSIS — F3110 Bipolar disorder, current episode manic without psychotic features, unspecified: Secondary | ICD-10-CM | POA: Diagnosis not present

## 2016-04-17 DIAGNOSIS — F4310 Post-traumatic stress disorder, unspecified: Secondary | ICD-10-CM | POA: Diagnosis not present

## 2016-04-29 ENCOUNTER — Ambulatory Visit: Payer: Medicare Other

## 2016-04-29 DIAGNOSIS — R221 Localized swelling, mass and lump, neck: Secondary | ICD-10-CM | POA: Diagnosis not present

## 2016-05-01 DIAGNOSIS — F4310 Post-traumatic stress disorder, unspecified: Secondary | ICD-10-CM | POA: Diagnosis not present

## 2016-05-01 DIAGNOSIS — F3110 Bipolar disorder, current episode manic without psychotic features, unspecified: Secondary | ICD-10-CM | POA: Diagnosis not present

## 2016-05-04 ENCOUNTER — Encounter (HOSPITAL_COMMUNITY): Payer: Self-pay | Admitting: *Deleted

## 2016-05-04 ENCOUNTER — Emergency Department (HOSPITAL_COMMUNITY)
Admission: EM | Admit: 2016-05-04 | Discharge: 2016-05-04 | Disposition: A | Discharge status: Home / Self Care | Payer: Medicare Other | Attending: Emergency Medicine | Admitting: Emergency Medicine

## 2016-05-04 ENCOUNTER — Emergency Department (HOSPITAL_COMMUNITY): Payer: Medicare Other

## 2016-05-04 DIAGNOSIS — Z79899 Other long term (current) drug therapy: Secondary | ICD-10-CM | POA: Diagnosis not present

## 2016-05-04 DIAGNOSIS — J45909 Unspecified asthma, uncomplicated: Secondary | ICD-10-CM | POA: Insufficient documentation

## 2016-05-04 DIAGNOSIS — M542 Cervicalgia: Secondary | ICD-10-CM | POA: Insufficient documentation

## 2016-05-04 DIAGNOSIS — F17210 Nicotine dependence, cigarettes, uncomplicated: Secondary | ICD-10-CM | POA: Insufficient documentation

## 2016-05-04 LAB — I-STAT CHEM 8, ED
BUN: 12 mg/dL (ref 6–20)
CALCIUM ION: 1.11 mmol/L — AB (ref 1.15–1.40)
CHLORIDE: 103 mmol/L (ref 101–111)
Creatinine, Ser: 0.8 mg/dL (ref 0.44–1.00)
Glucose, Bld: 88 mg/dL (ref 65–99)
HEMATOCRIT: 46 % (ref 36.0–46.0)
Hemoglobin: 15.6 g/dL — ABNORMAL HIGH (ref 12.0–15.0)
POTASSIUM: 4 mmol/L (ref 3.5–5.1)
SODIUM: 139 mmol/L (ref 135–145)
TCO2: 28 mmol/L (ref 0–100)

## 2016-05-04 MED ORDER — IOPAMIDOL (ISOVUE-300) INJECTION 61%
INTRAVENOUS | Status: AC
  Administered 2016-05-04: 75 mL
  Filled 2016-05-04: qty 75

## 2016-05-04 MED ORDER — IBUPROFEN 400 MG PO TABS: 400.0000 mg | ORAL_TABLET | Freq: Three times a day (TID) | ORAL | 0 refills | Status: DC | PRN
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
## 2016-05-04 NOTE — ED Notes (Signed)
Pt states she has had a feeling of something in her throat x 2 weeks saw her ENT last Tuesday and he said it was cartilage that she was feeling

## 2016-05-04 NOTE — ED Triage Notes (Signed)
Pt reports having firm mass or swelling to throat, causing soreness. Airway intact. No fever.

## 2016-05-04 NOTE — ED Notes (Signed)
Coffee given to pt per Dr. Venora Maples

## 2016-05-04 NOTE — ED Notes (Signed)
Back from CT, up to br

## 2016-05-08 DIAGNOSIS — F4310 Post-traumatic stress disorder, unspecified: Secondary | ICD-10-CM | POA: Diagnosis not present

## 2016-05-08 DIAGNOSIS — F3110 Bipolar disorder, current episode manic without psychotic features, unspecified: Secondary | ICD-10-CM | POA: Diagnosis not present

## 2016-05-13 DIAGNOSIS — R221 Localized swelling, mass and lump, neck: Secondary | ICD-10-CM | POA: Diagnosis not present

## 2016-05-20 DIAGNOSIS — F3131 Bipolar disorder, current episode depressed, mild: Secondary | ICD-10-CM | POA: Diagnosis not present

## 2016-05-29 ENCOUNTER — Telehealth: Payer: Self-pay

## 2016-05-29 ENCOUNTER — Ambulatory Visit: Payer: Medicare Other | Admitting: Family Medicine

## 2016-05-29 ENCOUNTER — Ambulatory Visit (INDEPENDENT_AMBULATORY_CARE_PROVIDER_SITE_OTHER): Payer: Medicare Other | Admitting: Emergency Medicine

## 2016-05-29 VITALS — BP 132/62 | HR 75 | Temp 97.4°F | Resp 18 | Ht 68.0 in | Wt 136.0 lb

## 2016-05-29 DIAGNOSIS — Z87898 Personal history of other specified conditions: Secondary | ICD-10-CM

## 2016-05-29 DIAGNOSIS — M778 Other enthesopathies, not elsewhere classified: Secondary | ICD-10-CM

## 2016-05-29 DIAGNOSIS — N6019 Diffuse cystic mastopathy of unspecified breast: Secondary | ICD-10-CM | POA: Diagnosis not present

## 2016-05-29 DIAGNOSIS — Z1211 Encounter for screening for malignant neoplasm of colon: Secondary | ICD-10-CM | POA: Diagnosis not present

## 2016-05-29 DIAGNOSIS — F4310 Post-traumatic stress disorder, unspecified: Secondary | ICD-10-CM | POA: Diagnosis not present

## 2016-05-29 DIAGNOSIS — F3110 Bipolar disorder, current episode manic without psychotic features, unspecified: Secondary | ICD-10-CM | POA: Diagnosis not present

## 2016-05-29 MED ORDER — DOXYCYCLINE HYCLATE 100 MG PO TABS: 100.0000 mg | ORAL_TABLET | Freq: Two times a day (BID) | ORAL | 0 refills | Status: DC

## 2016-05-29 MED ORDER — HYDROCODONE-ACETAMINOPHEN 5-325 MG PO TABS: 1.0000 | ORAL_TABLET | Freq: Four times a day (QID) | ORAL | 0 refills | Status: DC | PRN

## 2016-05-29 MED ORDER — ALBUTEROL SULFATE HFA 108 (90 BASE) MCG/ACT IN AERS: INHALATION_SPRAY | RESPIRATORY_TRACT | 2 refills | Status: DC

## 2016-05-29 MED ORDER — IBUPROFEN 800 MG PO TABS: 800.0000 mg | ORAL_TABLET | Freq: Three times a day (TID) | ORAL | 1 refills | Status: DC | PRN

## 2016-05-29 NOTE — Telephone Encounter (Signed)
I have just sent out the referral. It is going to [place] with [facility] that the pt is referring to does not preform surgery. If should would like to see them, we would need to order breast u/s or a mammogram.

## 2016-05-29 NOTE — Telephone Encounter (Signed)
Pt has called twice since her appt with Dr. Leonette Monarch about getting the order put in to the breast center so she can schedule an appt tomorrow   Best number [phone]

## 2016-05-29 NOTE — Telephone Encounter (Signed)
I called the breast center and they referred me to dr Excell Seltzer at surgery center where she was referred for breast testing and I called them and they said she cancelled every appt she had with them for her breasts since 2016, if we want her seen we need to do a surgical referral.

## 2016-05-29 NOTE — Patient Instructions (Signed)
     IF you received an x-ray today, you will receive an invoice from [facility]. Please contact  Radiology at [phone] with questions or concerns regarding your invoice.   IF you received labwork today, you will receive an invoice from [facility]. Please contact [facility] at [phone] with questions or concerns regarding your invoice.   Our billing staff will not be able to assist you with questions regarding bills from these companies.  You will be contacted with the lab results as soon as they are available. The fastest way to get your results is to activate your My Chart account. Instructions are located on the last page of this paperwork. If you have not heard from us regarding the results in 2 weeks, please contact this office.     
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
## 2016-05-29 NOTE — Telephone Encounter (Signed)
Attempted to call pt back. No answer and DPR states that we are not to leave VM. Relay to pt that at this time, further imaging at the [facility] is not necessary. General surgery is the next step in this process and they will be able to order any imaging tests that may be necessary.

## 2016-05-29 NOTE — Progress Notes (Signed)
Breast imaging needs order and [facility] needs imaging before they schedule appt. Pt advised

## 2016-05-29 NOTE — Telephone Encounter (Signed)
Please place order for mammo or u/s for this patient as appropriate .

## 2016-05-29 NOTE — Telephone Encounter (Signed)
I spoke with Catherine Olsen , she was upset and accusing [place] of not doing our job and talking /referring her. I and advised we have called and referred to breast center and dr hoxworths office today and we  have done the appropriate steps to get her care. They will contact patient for appt. She told me she was calling them now and thanked me and hung up.

## 2016-05-29 NOTE — Telephone Encounter (Signed)
Looks like the provider ordered an ambulatory referral to Breast Clinic rather than ordering the needed imaging. Spoke with [place] working with him today to order the imaging (probably diagnostic mammogram and [place]) at [facility].

## 2016-05-29 NOTE — Addendum Note (Signed)
Addended by: Davina Poke on: 05/29/2016 06:00 PM   Modules accepted: Orders

## 2016-05-29 NOTE — Addendum Note (Signed)
Addended by: Virgia Land on: 05/29/2016 05:26 PM   Modules accepted: Orders

## 2016-05-29 NOTE — Addendum Note (Signed)
Addended by: Virgia Land on: 05/29/2016 05:12 PM   Modules accepted: Orders

## 2016-05-30 ENCOUNTER — Other Ambulatory Visit: Payer: Self-pay | Admitting: Physician Assistant

## 2016-05-30 ENCOUNTER — Telehealth: Payer: Self-pay

## 2016-05-30 ENCOUNTER — Other Ambulatory Visit: Payer: Self-pay | Admitting: Emergency Medicine

## 2016-05-30 DIAGNOSIS — N6452 Nipple discharge: Secondary | ICD-10-CM

## 2016-05-30 DIAGNOSIS — N6019 Diffuse cystic mastopathy of unspecified breast: Secondary | ICD-10-CM

## 2016-05-30 DIAGNOSIS — N63 Unspecified lump in unspecified breast: Secondary | ICD-10-CM

## 2016-05-30 NOTE — Telephone Encounter (Signed)
error 

## 2016-06-03 ENCOUNTER — Ambulatory Visit
Admission: RE | Admit: 2016-06-03 | Discharge: 2016-06-03 | Disposition: A | Discharge status: Home / Self Care | Payer: Medicare Other | Source: Ambulatory Visit | Attending: Emergency Medicine | Admitting: Emergency Medicine

## 2016-06-03 DIAGNOSIS — N6019 Diffuse cystic mastopathy of unspecified breast: Secondary | ICD-10-CM

## 2016-06-03 DIAGNOSIS — N644 Mastodynia: Secondary | ICD-10-CM | POA: Diagnosis not present

## 2016-06-03 DIAGNOSIS — R922 Inconclusive mammogram: Secondary | ICD-10-CM | POA: Diagnosis not present

## 2016-06-03 DIAGNOSIS — N6452 Nipple discharge: Secondary | ICD-10-CM

## 2016-06-05 DIAGNOSIS — F3110 Bipolar disorder, current episode manic without psychotic features, unspecified: Secondary | ICD-10-CM | POA: Diagnosis not present

## 2016-06-05 DIAGNOSIS — F4310 Post-traumatic stress disorder, unspecified: Secondary | ICD-10-CM | POA: Diagnosis not present

## 2016-06-19 DIAGNOSIS — F17210 Nicotine dependence, cigarettes, uncomplicated: Secondary | ICD-10-CM | POA: Diagnosis not present

## 2016-06-19 DIAGNOSIS — E236 Other disorders of pituitary gland: Secondary | ICD-10-CM | POA: Diagnosis not present

## 2016-06-19 DIAGNOSIS — R938 Abnormal findings on diagnostic imaging of other specified body structures: Secondary | ICD-10-CM | POA: Diagnosis not present

## 2016-06-19 DIAGNOSIS — Z888 Allergy status to other drugs, medicaments and biological substances status: Secondary | ICD-10-CM | POA: Diagnosis not present

## 2016-06-19 DIAGNOSIS — Z885 Allergy status to narcotic agent status: Secondary | ICD-10-CM | POA: Diagnosis not present

## 2016-06-19 DIAGNOSIS — Z886 Allergy status to analgesic agent status: Secondary | ICD-10-CM | POA: Diagnosis not present

## 2016-06-19 DIAGNOSIS — Z882 Allergy status to sulfonamides status: Secondary | ICD-10-CM | POA: Diagnosis not present

## 2016-06-19 DIAGNOSIS — F4310 Post-traumatic stress disorder, unspecified: Secondary | ICD-10-CM | POA: Diagnosis not present

## 2016-06-19 DIAGNOSIS — Z880 Allergy status to penicillin: Secondary | ICD-10-CM | POA: Diagnosis not present

## 2016-06-19 DIAGNOSIS — F3110 Bipolar disorder, current episode manic without psychotic features, unspecified: Secondary | ICD-10-CM | POA: Diagnosis not present

## 2016-06-19 DIAGNOSIS — E237 Disorder of pituitary gland, unspecified: Secondary | ICD-10-CM | POA: Diagnosis not present

## 2016-06-26 DIAGNOSIS — N6012 Diffuse cystic mastopathy of left breast: Secondary | ICD-10-CM | POA: Diagnosis not present

## 2016-06-26 DIAGNOSIS — N6011 Diffuse cystic mastopathy of right breast: Secondary | ICD-10-CM | POA: Diagnosis not present

## 2016-06-26 DIAGNOSIS — K439 Ventral hernia without obstruction or gangrene: Secondary | ICD-10-CM | POA: Diagnosis not present

## 2016-07-02 DIAGNOSIS — F3132 Bipolar disorder, current episode depressed, moderate: Secondary | ICD-10-CM | POA: Diagnosis not present

## 2016-07-03 DIAGNOSIS — F4310 Post-traumatic stress disorder, unspecified: Secondary | ICD-10-CM | POA: Diagnosis not present

## 2016-07-03 DIAGNOSIS — F3110 Bipolar disorder, current episode manic without psychotic features, unspecified: Secondary | ICD-10-CM | POA: Diagnosis not present

## 2016-07-08 DIAGNOSIS — F3110 Bipolar disorder, current episode manic without psychotic features, unspecified: Secondary | ICD-10-CM | POA: Diagnosis not present

## 2016-07-08 DIAGNOSIS — F4310 Post-traumatic stress disorder, unspecified: Secondary | ICD-10-CM | POA: Diagnosis not present

## 2016-07-17 DIAGNOSIS — F4310 Post-traumatic stress disorder, unspecified: Secondary | ICD-10-CM | POA: Diagnosis not present

## 2016-07-17 DIAGNOSIS — F3110 Bipolar disorder, current episode manic without psychotic features, unspecified: Secondary | ICD-10-CM | POA: Diagnosis not present

## 2016-07-22 ENCOUNTER — Other Ambulatory Visit: Payer: Self-pay | Admitting: Physician Assistant

## 2016-07-22 ENCOUNTER — Other Ambulatory Visit: Payer: Self-pay | Admitting: General Surgery

## 2016-07-22 DIAGNOSIS — N63 Unspecified lump in unspecified breast: Secondary | ICD-10-CM

## 2016-07-23 ENCOUNTER — Ambulatory Visit
Admission: RE | Admit: 2016-07-23 | Discharge: 2016-07-23 | Disposition: A | Discharge status: Home / Self Care | Payer: Medicare Other | Source: Ambulatory Visit | Attending: Physician Assistant | Admitting: Physician Assistant

## 2016-07-23 DIAGNOSIS — N63 Unspecified lump in unspecified breast: Secondary | ICD-10-CM

## 2016-07-23 DIAGNOSIS — R922 Inconclusive mammogram: Secondary | ICD-10-CM | POA: Diagnosis not present

## 2016-07-23 DIAGNOSIS — N6489 Other specified disorders of breast: Secondary | ICD-10-CM | POA: Diagnosis not present

## 2016-07-31 DIAGNOSIS — F4310 Post-traumatic stress disorder, unspecified: Secondary | ICD-10-CM | POA: Diagnosis not present

## 2016-07-31 DIAGNOSIS — F3110 Bipolar disorder, current episode manic without psychotic features, unspecified: Secondary | ICD-10-CM | POA: Diagnosis not present

## 2016-08-28 DIAGNOSIS — F4310 Post-traumatic stress disorder, unspecified: Secondary | ICD-10-CM | POA: Diagnosis not present

## 2016-08-28 DIAGNOSIS — F3110 Bipolar disorder, current episode manic without psychotic features, unspecified: Secondary | ICD-10-CM | POA: Diagnosis not present

## 2016-09-03 DIAGNOSIS — F3110 Bipolar disorder, current episode manic without psychotic features, unspecified: Secondary | ICD-10-CM | POA: Diagnosis not present

## 2016-09-03 DIAGNOSIS — F4310 Post-traumatic stress disorder, unspecified: Secondary | ICD-10-CM | POA: Diagnosis not present

## 2016-09-04 ENCOUNTER — Encounter: Payer: Self-pay | Admitting: Family Medicine

## 2016-09-04 ENCOUNTER — Ambulatory Visit (INDEPENDENT_AMBULATORY_CARE_PROVIDER_SITE_OTHER): Payer: Medicare Other | Admitting: Family Medicine

## 2016-09-04 VITALS — BP 96/72 | HR 73 | Temp 97.7°F | Resp 18 | Ht 67.32 in | Wt 140.0 lb

## 2016-09-04 DIAGNOSIS — Z0000 Encounter for general adult medical examination without abnormal findings: Secondary | ICD-10-CM | POA: Diagnosis not present

## 2016-09-04 DIAGNOSIS — Z8349 Family history of other endocrine, nutritional and metabolic diseases: Secondary | ICD-10-CM | POA: Insufficient documentation

## 2016-09-04 DIAGNOSIS — N80129 Deep endometriosis of ovary, unspecified ovary: Secondary | ICD-10-CM

## 2016-09-04 DIAGNOSIS — S46812A Strain of other muscles, fascia and tendons at shoulder and upper arm level, left arm, initial encounter: Secondary | ICD-10-CM | POA: Diagnosis not present

## 2016-09-04 DIAGNOSIS — Z720 Tobacco use: Secondary | ICD-10-CM | POA: Diagnosis not present

## 2016-09-04 DIAGNOSIS — R918 Other nonspecific abnormal finding of lung field: Secondary | ICD-10-CM

## 2016-09-04 DIAGNOSIS — T782XXA Anaphylactic shock, unspecified, initial encounter: Secondary | ICD-10-CM | POA: Insufficient documentation

## 2016-09-04 DIAGNOSIS — T782XXD Anaphylactic shock, unspecified, subsequent encounter: Secondary | ICD-10-CM

## 2016-09-04 DIAGNOSIS — N801 Endometriosis of ovary: Secondary | ICD-10-CM | POA: Diagnosis not present

## 2016-09-04 DIAGNOSIS — D352 Benign neoplasm of pituitary gland: Secondary | ICD-10-CM

## 2016-09-04 DIAGNOSIS — F4310 Post-traumatic stress disorder, unspecified: Secondary | ICD-10-CM

## 2016-09-04 DIAGNOSIS — L821 Other seborrheic keratosis: Secondary | ICD-10-CM | POA: Diagnosis not present

## 2016-09-04 DIAGNOSIS — J310 Chronic rhinitis: Secondary | ICD-10-CM

## 2016-09-04 DIAGNOSIS — M542 Cervicalgia: Secondary | ICD-10-CM

## 2016-09-04 DIAGNOSIS — N80109 Endometriosis of ovary, unspecified side, unspecified depth: Secondary | ICD-10-CM

## 2016-09-04 DIAGNOSIS — N6019 Diffuse cystic mastopathy of unspecified breast: Secondary | ICD-10-CM | POA: Diagnosis not present

## 2016-09-04 DIAGNOSIS — F310 Bipolar disorder, current episode hypomanic: Secondary | ICD-10-CM

## 2016-09-04 DIAGNOSIS — R911 Solitary pulmonary nodule: Secondary | ICD-10-CM | POA: Insufficient documentation

## 2016-09-04 MED ORDER — EPINEPHRINE 0.15 MG/0.15ML IJ SOAJ
0.1500 mg | INTRAMUSCULAR | 2 refills | Status: DC | PRN
Start: 2016-09-04 — End: 2016-09-05

## 2016-09-04 MED ORDER — ALBUTEROL SULFATE HFA 108 (90 BASE) MCG/ACT IN AERS: INHALATION_SPRAY | RESPIRATORY_TRACT | 2 refills | Status: DC

## 2016-09-04 MED ORDER — IBUPROFEN 800 MG PO TABS: 800.0000 mg | ORAL_TABLET | Freq: Three times a day (TID) | ORAL | 1 refills | Status: DC | PRN

## 2016-09-04 MED ORDER — HYDROCODONE-ACETAMINOPHEN 5-325 MG PO TABS: 1.0000 | ORAL_TABLET | Freq: Four times a day (QID) | ORAL | 0 refills | Status: DC | PRN

## 2016-09-04 NOTE — Patient Instructions (Addendum)
  RECOMMEND CT SCAN LUNG IN LATE July 2018.   IF you received an x-ray today, you will receive an invoice from [facility]. Please contact [facility] at [phone] with questions or concerns regarding your invoice.   IF you received labwork today, you will receive an invoice from [facility]. Please contact [facility] at [phone] with questions or concerns regarding your invoice.   Our billing staff will not be able to assist you with questions regarding bills from these companies.  You will be contacted with the lab results as soon as they are available. The fastest way to get your results is to activate your My Chart account. Instructions are located on the last page of this paperwork. If you have not heard from [place] regarding the results in 2 weeks, please contact this office.

## 2016-09-04 NOTE — Progress Notes (Signed)
Subjective:    Patient ID: Catherine Olsen, female    DOB: Aug 25, 1970, 46 y.o.   MRN: [id number]  09/04/2016  Annual Exam   HPI This 46 y.o. female presents for Complete Physical Examination.  Last physical:  01-2015 Pap smear:  01-2015 Mammogram:  07-2016  BP Readings from Last 3 Encounters:  09/04/16 96/72  05/29/16 132/62  05/04/16 117/80   Wt Readings from Last 3 Encounters:  09/04/16 140 lb (63.5 kg)  05/29/16 136 lb (61.7 kg)  05/04/16 135 lb (61.2 kg)   There is no immunization history for the selected administration types on file for this patient.  Vaginal bleeding: does progesterone every 3 months; lowered estrogen patch dose due to tobacco use.  Has intermittent bleeding.    Neck: quit smoking cigarettesin 04/2016; had to stop using it.  Wants tongue and neck evaluated.  Smoking 1 cigarette per day.  Thinks it is a neck exercise perfroming.  Snarles and feels L anterior neck sprain.  S/p CT neck negative.  Friend just diagnosed with thyroid cancer.  S/p evaluation by Redmond Baseman 05/13/2016.  CT neck 05/04/2016 thyroid WNL; neck WNL.   L lateral tongue swelling intermittent chronic.    Endometriosis: has new therapist.   Tobacco abuse: down to 1 cigarette per day; no longer having smokers cough.    Lung nodules: on CT neck; recommended CT chest in one year.    Bipolar: followed by psychiatry Theodoro Grist for ten years.  Every month.  Also seeing therapist every week.  Asthma: was using Ventolin all the time. Now that decreased smoking, wheezing has greatly decreased.  Brother diagnosed with alpha-1 antitrypsin; brother really wants patient evaluated.  Allergic reaction: needs Epi pen refill due to multiple allergies.   Migraines: Much improved since quit smoking; requesting refill of hydrocodone.    Review of Systems  Constitutional: Negative for activity change, appetite change, chills, diaphoresis, fatigue, fever and unexpected weight change.  HENT: Positive for mouth  sores. Negative for congestion, dental problem, drooling, ear discharge, ear pain, facial swelling, hearing loss, nosebleeds, postnasal drip, rhinorrhea, sinus pain, sinus pressure, sneezing, sore throat, tinnitus, trouble swallowing and voice change.   Eyes: Negative for photophobia, pain, discharge, redness, itching and visual disturbance.  Respiratory: Negative for apnea, cough, choking, chest tightness, shortness of breath, wheezing and stridor.   Cardiovascular: Negative for chest pain, palpitations and leg swelling.  Gastrointestinal: Negative for abdominal distention, abdominal pain, anal bleeding, blood in stool, constipation, diarrhea, nausea, rectal pain and vomiting.  Endocrine: Negative for cold intolerance, heat intolerance, polydipsia, polyphagia and polyuria.  Genitourinary: Negative for decreased urine volume, difficulty urinating, dyspareunia, dysuria, enuresis, flank pain, frequency, genital sores, hematuria, menstrual problem, pelvic pain, urgency, vaginal bleeding, vaginal discharge and vaginal pain.  Musculoskeletal: Negative for arthralgias, back pain, gait problem, joint swelling, myalgias, neck pain and neck stiffness.  Skin: Negative for color change, pallor, rash and wound.  Allergic/Immunologic: Negative for environmental allergies, food allergies and immunocompromised state.  Neurological: Negative for dizziness, tremors, seizures, syncope, facial asymmetry, speech difficulty, weakness, light-headedness, numbness and headaches.  Hematological: Negative for adenopathy. Does not bruise/bleed easily.  Psychiatric/Behavioral: Negative for agitation, behavioral problems, confusion, decreased concentration, dysphoric mood, hallucinations, self-injury, sleep disturbance and suicidal ideas. The patient is nervous/anxious. The patient is not hyperactive.     Past Medical History:  Diagnosis Date  . Allergy   . Anxiety   . Arthritis    hands, lower back, knees  . Asthma   .  Bipolar 1 disorder ([place])   . Depression   . Endometriosis   . Fibromyalgia   . Headache(784.0)    otc med prn  . Mental disorder   . Pituitary tumor   . PONV (postoperative nausea and vomiting)   . PTSD (post-traumatic stress disorder)   . Scoliosis   . Scoliosis   . Termination of pregnancy    x 2 at age 33 and 46 yrs old   Past Surgical History:  Procedure Laterality Date  . BREAST SURGERY    . DILATION AND CURETTAGE OF UTERUS    . FACIAL COSMETIC SURGERY     right cheek  . LAPAROSCOPY Right 11/11/2013   Procedure: LAPAROSCOPY OPERATIVE with  Drainage of  RIGHT Ovarian ENDOMETRIOMA;  Surgeon: Elveria Royals, MD;  Location: [facility];  Service: Gynecology;  Laterality: Right;  . NOSE SURGERY     rhinoplasty at age 27 yrs  . WISDOM TOOTH EXTRACTION     Allergies  Allergen Reactions  . Sulfamethoxazole Anaphylaxis  . Oxycodone Other (See Comments)    Severe blindness?  . Abilify [Aripiprazole]     Generic Abilify--causes severe neurological problems. Can use name brand  . Penicillins Hives    Has patient had a PCN reaction causing immediate rash, facial/tongue/throat swelling, SOB or lightheadedness with hypotension: no Has patient had a PCN reaction causing severe rash involving mucus membranes or skin necrosis: NO Has patient had a PCN reaction that required hospitalizationNO Has patient had a PCN reaction occurring within the last 10 years:NO If all of the above answers are "NO", then may proceed with Cephalosporin use.   . Other Anxiety    steroids  . Percocet [Oxycodone-Acetaminophen] Palpitations  . Sulfa Antibiotics Hives  . Sulfites Other (See Comments)    unknown    Social History   Social History  . Marital status: Divorced    Spouse name: N/A  . Number of children: 0  . Years of education: N/A   Occupational History  . disability     mental illness   Social History Main Topics  . Smoking status: Current Every Day Smoker    Packs/day: 0.00     Years: 29.00    Types: Cigarettes, E-cigarettes  . Smokeless tobacco: Never Used  . Alcohol use Yes     Comment: "  . Drug use: Yes    Types: "Crack" cocaine, Cocaine     Comment: Heavy user per patient, last use Tuesday 11/08/13  LAST CRACK  2015  . Sexual activity: No     Comment: abortion at 60 and 73yrs. of age    Other Topics Concern  . Not on file   Social History Narrative   Marital status: divorced; not dating      Children: none      Lives: alone with mom      Employment: disability for mental illness in 1997.  Hospitalizations x 9 in past.      Tobacco: 1 ppd x since age 7. Decreasing in 2018.      Alcohol: socially; beers.      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      Exercise: yoga daily; exercise daily.      ADLs: independent with ADLs; no car; depends on others for transportation.      Family History  Problem Relation Age of Onset  . Diabetes Mother   . Hypertension Mother   . Stroke Mother 51  CVA  . Mental illness Mother        no diagnosis; personality disorder  . Hyperlipidemia Father   . Hypertension Father   . COPD Father   . Alpha-1 antitrypsin deficiency Father   . Alpha-1 antitrypsin deficiency Brother   . Diabetes Maternal Grandmother   . Heart disease Maternal Grandmother   . Hyperlipidemia Maternal Grandmother   . Hypertension Maternal Grandmother   . Mental illness Maternal Grandmother   . Heart disease Maternal Grandfather   . Hyperlipidemia Maternal Grandfather   . Hypertension Maternal Grandfather   . Heart disease Paternal Grandfather   . Hyperlipidemia Paternal Grandfather   . Hypertension Paternal Grandfather   . Stroke Paternal Grandfather        Objective:    BP 96/72   Pulse 73   Temp 97.7 F (36.5 C) (Oral)   Resp 18   Ht 5' 7.32" (1.71 m)   Wt 140 lb (63.5 kg)   LMP 11/21/2014 Comment: Pt. states she is having bleeding today and has been for 9 days.  SpO2 97%   BMI 21.72 kg/m    Physical Exam  Constitutional: She is oriented to person, place, and time. She appears well-developed and well-nourished. No distress.  HENT:  Head: Normocephalic and atraumatic.  Right Ear: External ear normal.  Left Ear: External ear normal.  Nose: Nose normal.  Mouth/Throat: Oropharynx is clear and moist.  Eyes: Conjunctivae and EOM are normal. Pupils are equal, round, and reactive to light.  Neck: Normal range of motion and full passive range of motion without pain. Neck supple. No JVD present. Carotid bruit is not present. No thyromegaly present.  Cardiovascular: Normal rate, regular rhythm and normal heart sounds.  Exam reveals no gallop and no friction rub.   No murmur heard. Pulmonary/Chest: Effort normal and breath sounds normal. She has no wheezes. She has no rales.  Abdominal: Soft. Bowel sounds are normal. She exhibits no distension and no mass. There is no tenderness. There is no rebound and no guarding.  Musculoskeletal:       Right shoulder: Normal.       Left shoulder: Normal.       Cervical back: She exhibits tenderness and pain.  R trapezius TTP; pain reproduced with range of motion of cervical spine.   Lymphadenopathy:    She has no cervical adenopathy.  Neurological: She is alert and oriented to person, place, and time. She has normal reflexes. No cranial nerve deficit. She exhibits normal muscle tone. Coordination normal.  Skin: Skin is warm and dry. No rash noted. She is not diaphoretic. No erythema. No pallor.  Psychiatric: She has a normal mood and affect. Her behavior is normal. Judgment and thought content normal.  Nursing note and vitals reviewed.  Results for orders placed or performed during the hospital encounter of 05/04/16  I-Stat Chem 8, ED  Result Value Ref Range   Sodium 139 135 - 145 mmol/L   Potassium 4.0 3.5 - 5.1 mmol/L   Chloride 103 101 - 111 mmol/L   BUN 12 6 - 20 mg/dL   Creatinine, Ser 0.80 0.44 - 1.00 mg/dL   Glucose, Bld 88 65 - 99  mg/dL   Calcium, Ion 1.11 (L) 1.15 - 1.40 mmol/L   TCO2 28 0 - 100 mmol/L   Hemoglobin 15.6 (H) 12.0 - 15.0 g/dL   HCT 46.0 36.0 - 46.0 %   Depression screen [facility] 2/9 09/04/2016 12/26/2015 09/19/2015 08/23/2015 07/26/2015  Decreased Interest 1 0 1  0 2  Down, Depressed, Hopeless 1 0 1 0 2  PHQ - 2 Score 2 0 2 0 4  Altered sleeping - - 1 - 2  Tired, decreased energy - - 1 - 2  Change in appetite - - 1 - 0  Feeling bad or failure about yourself  - - 1 - 2  Trouble concentrating - - 1 - 3  Moving slowly or fidgety/restless - - 1 - 0  Suicidal thoughts - - 0 - 0  PHQ-9 Score - - 8 - 13  Difficult doing work/chores - - Extremely dIfficult - Extremely dIfficult  Some recent data might be hidden   Fall Risk  09/04/2016 05/29/2016 12/26/2015 09/19/2015 08/23/2015  Falls in the past year? No No No No No       Assessment & Plan:   1. Encounter for Medicare annual wellness exam   2. Endometrioma of ovary   3. Endometriosis of ovary   4. Bipolar affective disorder, current episode hypomanic ([place])   5. Fibrocystic breast disease (FCBD), unspecified laterality   6. Posttraumatic stress disorder   7. Tobacco abuse   8. KERATOSIS, SEBORRHEIC NEC   9. Chronic rhinitis   10. Pituitary microadenoma ([place])   11. Idiopathic anaphylaxis, subsequent encounter   12. Family history of alpha 1 antitrypsin deficiency   62. Trapezius strain, left, initial encounter   14. Anterior neck pain    -anticipatory guidance provided --- exercise, weight maintenance, safe driving practices, safe sexual practices, smoking cessation. -refill of Advil and Hydrocodone provided for migraines. -refill of Albuterol provided for asthma. -brother dx with alpha-1 antitrypsin deficiency; pt also with small lung nodules detected on CT neck; warrants CT chest; pt requesting referral to pulmonology.  Pt really working on smoking cessation. -excessive worry regarding mouth and neck cancer secondary to smoking; benign neck exam and  oral exam in office; s/p ENT consultation in 05/2016; s/p CT neck in 04/2016; reassurance provided.  Does warrant CT chest in upcoming months due to small lung nodules detected; will refer to pulmonology.  Recommend follow-up with Dr. Redmond Baseman of ENT if neck symptoms increase in intensity or frequency. -bipolar disorder followed closely by psychiatry and therapist; disabled due to bipolar disorder. -menopause and endometriosis and ovarian endometrioma managed by gynecology. -also followed by endocrine due to pituitary microadenoma.     Orders Placed This Encounter  Procedures  . Ambulatory referral to Pulmonology    Referral Priority:   Routine    Referral Type:   Consultation    Referral Reason:   Specialty Services Required    Requested Specialty:   Pulmonary Disease    Number of Visits Requested:   1   Meds ordered this encounter  Medications  . ibuprofen (ADVIL,MOTRIN) 800 MG tablet    Sig: Take 1 tablet (800 mg total) by mouth every 8 (eight) hours as needed.    Dispense:  60 tablet    Refill:  1  . albuterol (VENTOLIN HFA) 108 (90 Base) MCG/ACT inhaler    Sig: INHALE 2 PUFFS INTO THE LUNGS EVERY 6 HOURS AS NEEDED FOR WHEEZING OR SHORTNESS OF BREATH    Dispense:  18 g    Refill:  2  . EPINEPHrine 0.15 MG/0.15ML IJ injection    Sig: Inject 0.15 mLs (0.15 mg total) into the muscle as needed for anaphylaxis.    Dispense:  2 Device    Refill:  2  . HYDROcodone-acetaminophen (NORCO/VICODIN) 5-325 MG tablet    Sig: Take 1  tablet by mouth every 6 (six) hours as needed.    Dispense:  15 tablet    Refill:  0    Return in about 3 months (around 12/05/2016) for recheck.   Lalitha Ilyas Elayne Guerin, M.D. Primary Care at [facility] [address] [place], [place]  [address] [phone] phone [phone] fax

## 2016-09-05 ENCOUNTER — Telehealth: Payer: Self-pay

## 2016-09-05 MED ORDER — EPINEPHRINE 0.3 MG/0.3ML IJ SOAJ: 0.3000 mg | Freq: Once | INTRAMUSCULAR | 1 refills | Status: AC

## 2016-09-05 NOTE — Telephone Encounter (Signed)
Amy- [facility] pharmacist [phone] She call and stated a Rx was sent in for and Epi pen Jr. And she just wanted to clarify this is correct. Please advise.

## 2016-09-05 NOTE — Addendum Note (Signed)
Addended by: Wardell Honour on: 09/05/2016 02:02 PM   Modules accepted: Orders

## 2016-09-05 NOTE — Telephone Encounter (Signed)
My error; sent in Epi pen to pharmacy.

## 2016-09-08 ENCOUNTER — Telehealth: Payer: Self-pay | Admitting: Family Medicine

## 2016-09-08 NOTE — Telephone Encounter (Signed)
Confirm with pharmacy adult dose is correct, see 09/05/16 refill

## 2016-09-08 NOTE — Telephone Encounter (Signed)
[facility] called to verify the dosage of pts epi-pen.  She is currently still prescribed the pediatric dose of epi-pen rather than the adult dosage.  [phone]

## 2016-09-12 DIAGNOSIS — M542 Cervicalgia: Secondary | ICD-10-CM | POA: Diagnosis not present

## 2016-09-12 DIAGNOSIS — M47812 Spondylosis without myelopathy or radiculopathy, cervical region: Secondary | ICD-10-CM | POA: Diagnosis not present

## 2016-09-12 DIAGNOSIS — F3110 Bipolar disorder, current episode manic without psychotic features, unspecified: Secondary | ICD-10-CM | POA: Diagnosis not present

## 2016-09-12 DIAGNOSIS — Z6822 Body mass index (BMI) 22.0-22.9, adult: Secondary | ICD-10-CM | POA: Diagnosis not present

## 2016-09-12 DIAGNOSIS — F4310 Post-traumatic stress disorder, unspecified: Secondary | ICD-10-CM | POA: Diagnosis not present

## 2016-09-16 DIAGNOSIS — F3111 Bipolar disorder, current episode manic without psychotic features, mild: Secondary | ICD-10-CM | POA: Diagnosis not present

## 2016-09-20 IMAGING — MR MR HEAD WO/W CM
11 of 15 series · 35 of 48 positions shown · IV contrast (multihance)
Comparison: MRI 04/05/2014

CLINICAL DATA: Pituitary microadenoma. Facial swelling, rule out
acromegaly.

EXAM:
MRI HEAD WITHOUT AND WITH CONTRAST
TECHNIQUE: Multiplanar, multiecho pulse sequences of the brain and surrounding
structures were obtained without and with intravenous contrast.
CONTRAST:  8 mL MultiHance IV

[Series 3: T1 · sagittal · 5.0mm · 0.47mm/px · 3 of 24 slices shown (1 of 2)]
[im 1/24]
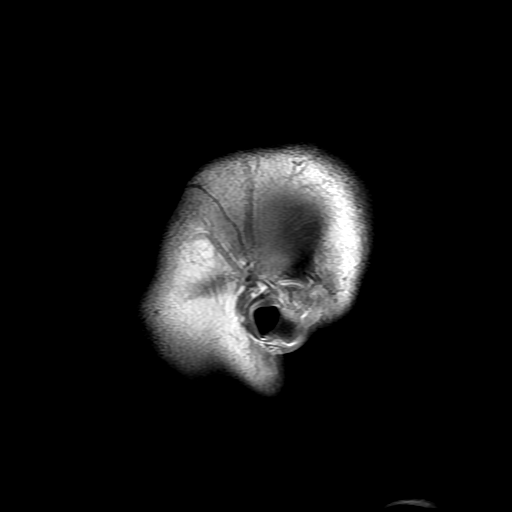
[im 12/24]
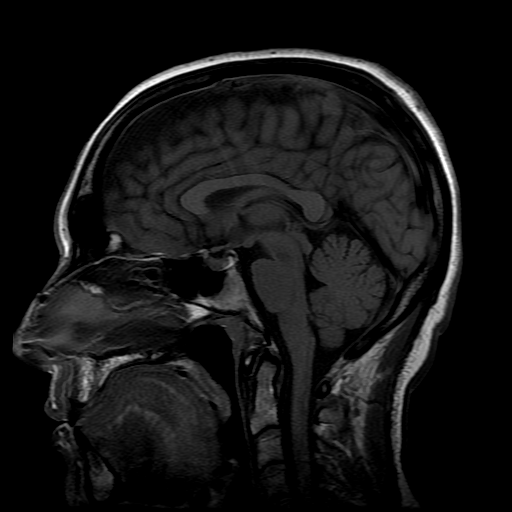
[im 24/24]
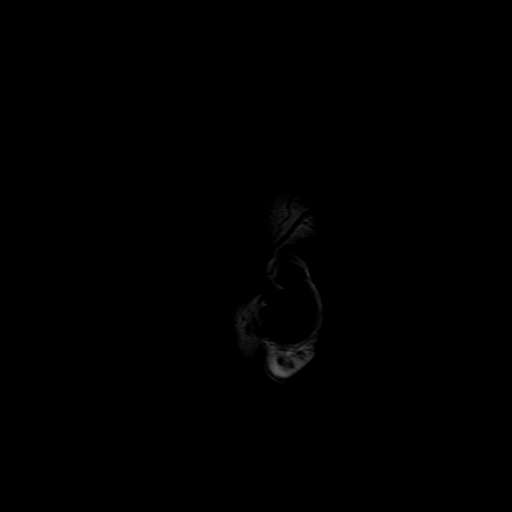

[Series 4: DWI · axial · 3.0mm · 1.09mm/px · z∈[-28,+115]mm · 9 of 98 slices shown (1 of 4)]
[im 1/98]
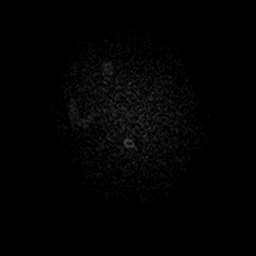
[im 13/98]
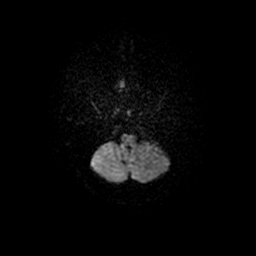
[im 25/98]
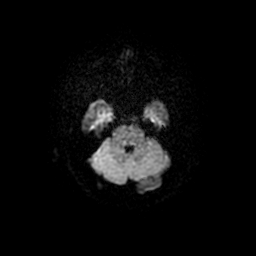
[im 37/98]
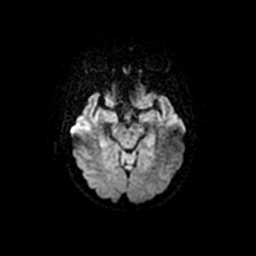
[im 49/98]
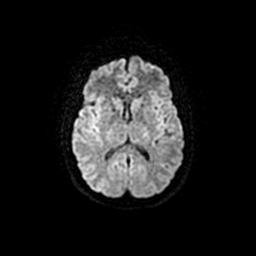
[im 61/98]
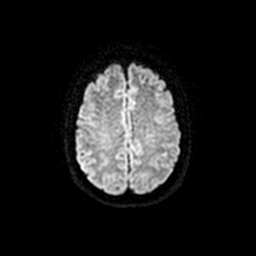
[im 73/98]
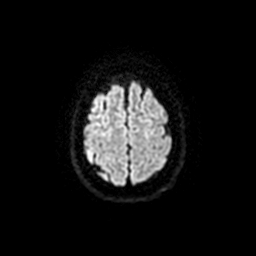
[im 85/98]
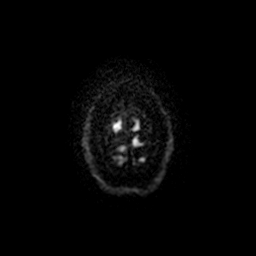
[im 98/98]
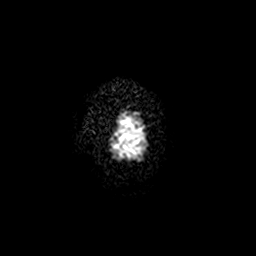

[Series 5: DWI · coronal · 5.0mm · 1.09mm/px · 6 of 66 slices shown (2 of 4)]
[im 1/66]
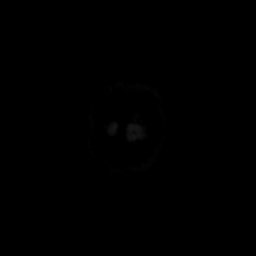
[im 14/66]
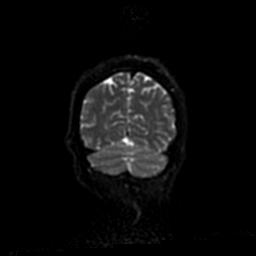
[im 27/66]
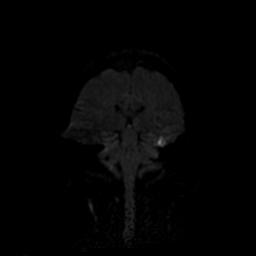
[im 40/66]
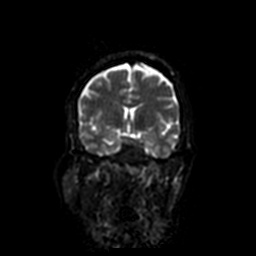
[im 53/66]
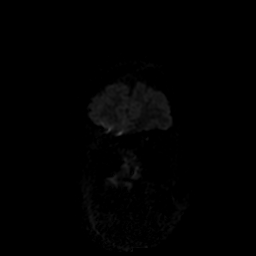
[im 66/66]
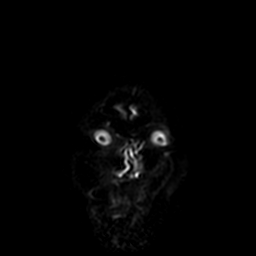

[Series 6: T2 · axial · 5.0mm · 0.43mm/px · z∈[-49,+106]mm · 2 of 25 slices shown]
[im 1/25]
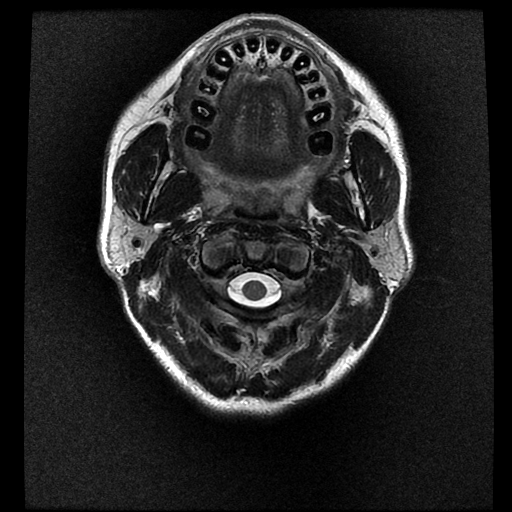
[im 25/25]
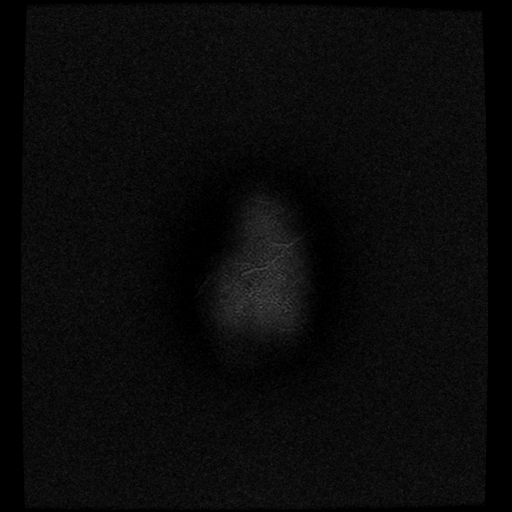

[Series 8: FLAIR · axial · 5.0mm · 0.43mm/px · z∈[-49,+106]mm · 2 of 25 slices shown]
[im 1/25]
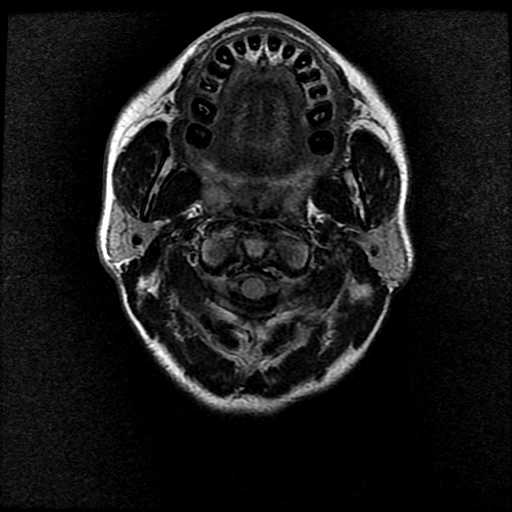
[im 25/25]
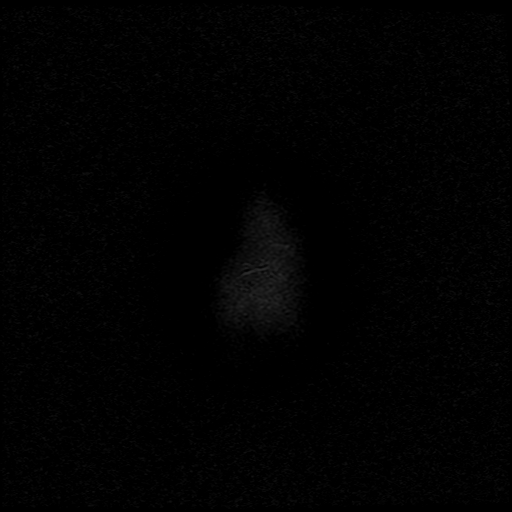

[Series 9: T1 · sagittal · 3.0mm · 0.35mm/px · 1 of 11 slices shown (2 of 2)]
[im 1/11]
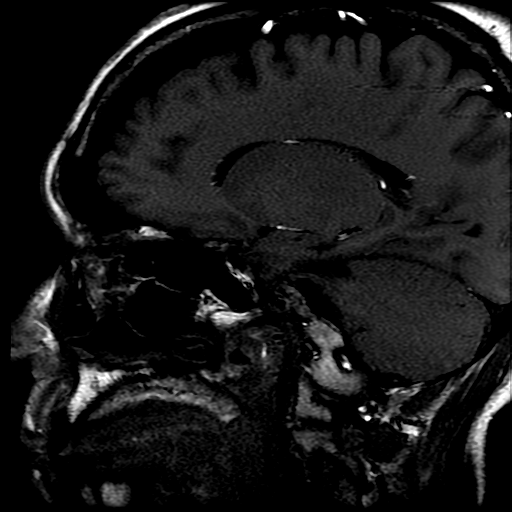

[Series 13: T1 post-contrast · sagittal · 3.0mm · 0.35mm/px · 1 of 11 slices shown (1 of 3)]
[im 1/11]
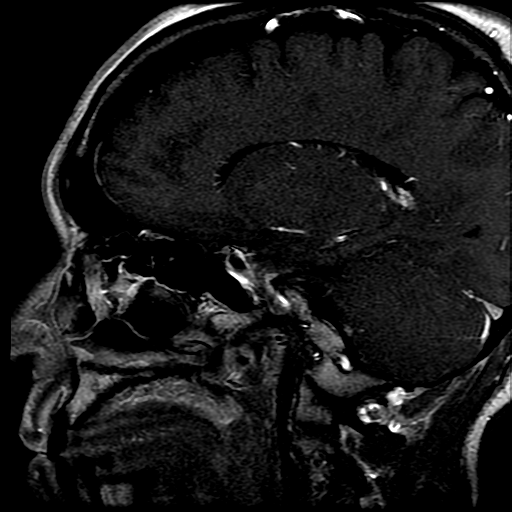

[Series 14: T1 post-contrast · coronal · 3.0mm · 0.35mm/px · 1 of 10 slices shown (2 of 3)]
[im 1/10]
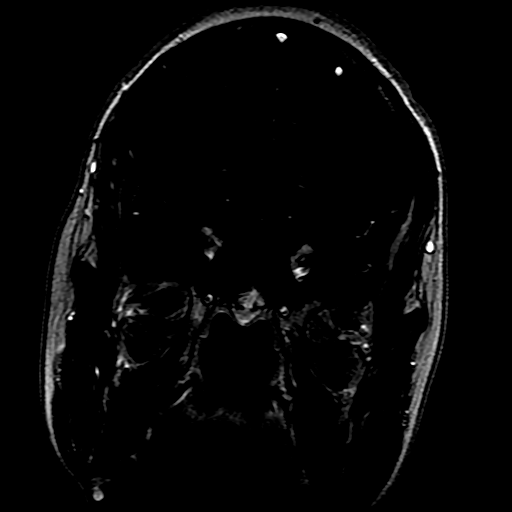

[Series 15: T1 post-contrast · coronal · 5.0mm · 0.45mm/px · 2 of 25 slices shown (3 of 3)]
[im 1/25]
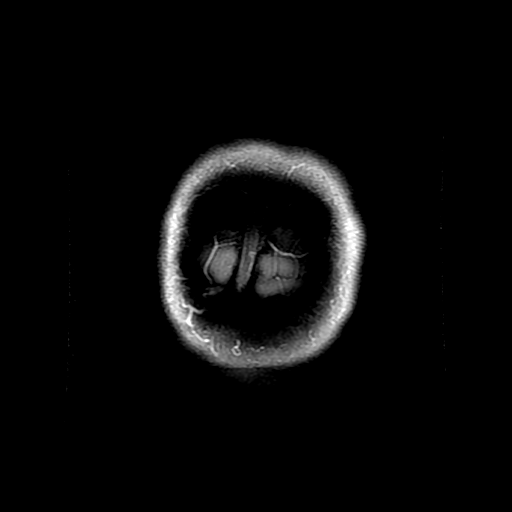
[im 25/25]
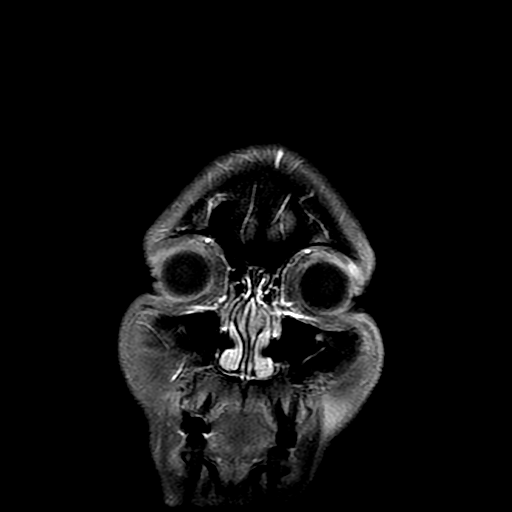

[Series 400: DWI · axial · 3.0mm · 1.09mm/px · z∈[-28,+115]mm · 5 of 49 slices shown (3 of 4)]
[im 1/49]
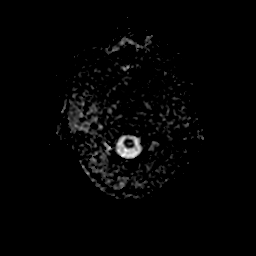
[im 13/49]
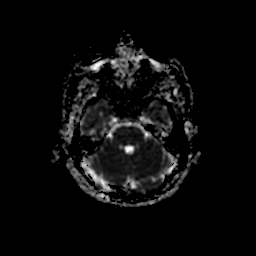
[im 25/49]
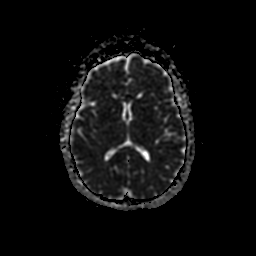
[im 37/49]
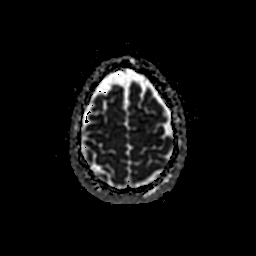
[im 49/49]
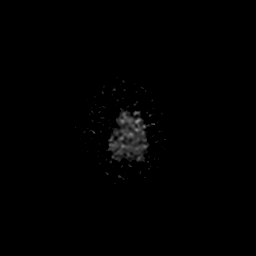

[Series 500: DWI · coronal · 5.0mm · 1.09mm/px · 3 of 33 slices shown (4 of 4)]
[im 1/33]
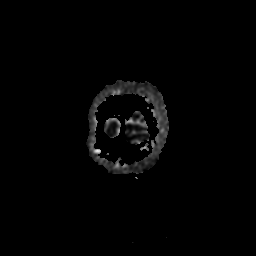
[im 17/33]
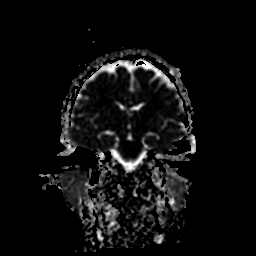
[im 33/33]
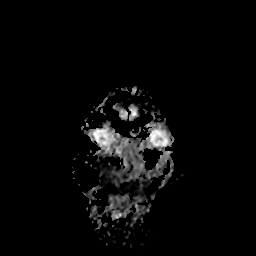

[35 of 48 positions shown; findings below may reference images not displayed]

FINDINGS: Dynamic pituitary protocol was performed with thin sections through
the sella.

Pituitary microadenoma on the right is unchanged. This measures
mm in height which is unchanged. This is seen on precontrast
imaging. Postcontrast the microadenoma shows mild hyper enhancement
relative to the remainder of the gland on the left. No compression
of the optic chiasm. Cavernous sinus is symmetric without tumor
extension.

Ventricle size normal. Negative for acute or chronic infarction.
Negative for intracranial hemorrhage.

Facial structures are symmetric and similar to the prior study.
Frontal sinuses are mildly hyper pneumatized and similar to the
prior study. This appears similar to a CT in 1119. I cannot diagnose
acromegaly on MRI. Paranasal sinuses are clear. Normal orbit
bilaterally.
IMPRESSION: 6.7 mm pituitary microadenoma on the right is unchanged from the
prior study.

Otherwise normal MRI of the brain.

These results will be called to the ordering clinician or
representative by the Radiologist Assistant, and communication
documented in the PACS or zVision Dashboard.

## 2016-09-25 DIAGNOSIS — F3110 Bipolar disorder, current episode manic without psychotic features, unspecified: Secondary | ICD-10-CM | POA: Diagnosis not present

## 2016-09-25 DIAGNOSIS — F4310 Post-traumatic stress disorder, unspecified: Secondary | ICD-10-CM | POA: Diagnosis not present

## 2016-09-27 ENCOUNTER — Emergency Department (HOSPITAL_COMMUNITY)
Admission: EM | Admit: 2016-09-27 | Discharge: 2016-09-27 | Discharge status: Against Medical Advice | Payer: Medicare Other | Attending: Emergency Medicine | Admitting: Emergency Medicine

## 2016-09-27 ENCOUNTER — Encounter (HOSPITAL_COMMUNITY): Payer: Self-pay

## 2016-09-27 DIAGNOSIS — R221 Localized swelling, mass and lump, neck: Secondary | ICD-10-CM | POA: Diagnosis present

## 2016-09-27 DIAGNOSIS — Z79899 Other long term (current) drug therapy: Secondary | ICD-10-CM | POA: Insufficient documentation

## 2016-09-27 DIAGNOSIS — Z0000 Encounter for general adult medical examination without abnormal findings: Secondary | ICD-10-CM | POA: Diagnosis not present

## 2016-09-27 DIAGNOSIS — J45909 Unspecified asthma, uncomplicated: Secondary | ICD-10-CM | POA: Insufficient documentation

## 2016-09-27 DIAGNOSIS — F17210 Nicotine dependence, cigarettes, uncomplicated: Secondary | ICD-10-CM | POA: Insufficient documentation

## 2016-09-27 NOTE — ED Notes (Signed)
MD at bedside per pt request.

## 2016-09-27 NOTE — Discharge Instructions (Signed)
Follow-up with your primary care provider for your concerns today.

## 2016-09-27 NOTE — ED Notes (Signed)
Pt left before discharge.
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
## 2016-09-27 NOTE — ED Triage Notes (Addendum)
Pt c/o painless knot in the middle of her neck. She wants to make sure that it is not cancer bc she has smoked for a long time. A&Ox4. Ambulatory.

## 2016-09-29 DIAGNOSIS — F17210 Nicotine dependence, cigarettes, uncomplicated: Secondary | ICD-10-CM | POA: Diagnosis not present

## 2016-09-29 DIAGNOSIS — J384 Edema of larynx: Secondary | ICD-10-CM | POA: Diagnosis not present

## 2016-10-02 DIAGNOSIS — F4310 Post-traumatic stress disorder, unspecified: Secondary | ICD-10-CM | POA: Diagnosis not present

## 2016-10-02 DIAGNOSIS — F3110 Bipolar disorder, current episode manic without psychotic features, unspecified: Secondary | ICD-10-CM | POA: Diagnosis not present

## 2016-10-06 DIAGNOSIS — F3110 Bipolar disorder, current episode manic without psychotic features, unspecified: Secondary | ICD-10-CM | POA: Diagnosis not present

## 2016-10-06 DIAGNOSIS — F4310 Post-traumatic stress disorder, unspecified: Secondary | ICD-10-CM | POA: Diagnosis not present

## 2016-10-13 DIAGNOSIS — Z780 Asymptomatic menopausal state: Secondary | ICD-10-CM | POA: Diagnosis not present

## 2016-10-20 DIAGNOSIS — F4310 Post-traumatic stress disorder, unspecified: Secondary | ICD-10-CM | POA: Diagnosis not present

## 2016-10-20 DIAGNOSIS — F3110 Bipolar disorder, current episode manic without psychotic features, unspecified: Secondary | ICD-10-CM | POA: Diagnosis not present

## 2016-11-10 DIAGNOSIS — F312 Bipolar disorder, current episode manic severe with psychotic features: Secondary | ICD-10-CM | POA: Diagnosis not present

## 2016-11-10 DIAGNOSIS — F603 Borderline personality disorder: Secondary | ICD-10-CM | POA: Diagnosis present

## 2016-11-10 DIAGNOSIS — F4321 Adjustment disorder with depressed mood: Secondary | ICD-10-CM | POA: Diagnosis present

## 2016-11-10 DIAGNOSIS — M1990 Unspecified osteoarthritis, unspecified site: Secondary | ICD-10-CM | POA: Diagnosis present

## 2016-11-10 DIAGNOSIS — F17210 Nicotine dependence, cigarettes, uncomplicated: Secondary | ICD-10-CM | POA: Diagnosis present

## 2016-11-10 DIAGNOSIS — R45851 Suicidal ideations: Secondary | ICD-10-CM | POA: Diagnosis present

## 2016-12-02 ENCOUNTER — Other Ambulatory Visit: Payer: Self-pay | Admitting: General Surgery

## 2016-12-02 DIAGNOSIS — N63 Unspecified lump in unspecified breast: Secondary | ICD-10-CM

## 2016-12-09 ENCOUNTER — Encounter: Payer: Self-pay | Admitting: Internal Medicine

## 2016-12-09 ENCOUNTER — Ambulatory Visit (INDEPENDENT_AMBULATORY_CARE_PROVIDER_SITE_OTHER): Payer: Medicare Other | Admitting: Internal Medicine

## 2016-12-09 VITALS — BP 136/76 | HR 82 | Ht 68.0 in | Wt 137.0 lb

## 2016-12-09 DIAGNOSIS — R918 Other nonspecific abnormal finding of lung field: Secondary | ICD-10-CM | POA: Diagnosis not present

## 2016-12-09 DIAGNOSIS — Z8349 Family history of other endocrine, nutritional and metabolic diseases: Secondary | ICD-10-CM

## 2016-12-09 DIAGNOSIS — R911 Solitary pulmonary nodule: Secondary | ICD-10-CM | POA: Diagnosis not present

## 2016-12-09 DIAGNOSIS — R0600 Dyspnea, unspecified: Secondary | ICD-10-CM | POA: Insufficient documentation

## 2016-12-09 DIAGNOSIS — R0609 Other forms of dyspnea: Secondary | ICD-10-CM | POA: Diagnosis not present

## 2016-12-09 DIAGNOSIS — F17200 Nicotine dependence, unspecified, uncomplicated: Secondary | ICD-10-CM | POA: Insufficient documentation

## 2016-12-09 DIAGNOSIS — Z87891 Personal history of nicotine dependence: Secondary | ICD-10-CM | POA: Diagnosis not present

## 2016-12-09 NOTE — Addendum Note (Signed)
Addended by: Georjean Mode on: 12/09/2016 12:49 PM   Modules accepted: Orders

## 2016-12-09 NOTE — Patient Instructions (Signed)
ICD-10-CM   1. Smoking history Z87.891   2. Nodule of left lung R91.1   3. Family history of alpha 1 antitrypsin deficiency Z83.49   4. Exertional dyspnea R06.09     Do alpha 1 genetic test  Do full PFT Do CT chest without contrast Work on quitting smoking  Followup Return to see me or APP next few to several weeks but after completing above

## 2016-12-09 NOTE — Progress Notes (Signed)
Subjective:    Patient ID: Catherine Olsen, female    DOB: 1970/09/23, 46 y.o.   MRN: [id number]  HPI  PCP Wardell Honour, MD   IOV 12/09/2016  Chief Complaint  Patient presents with  . Advice Only    Pt referred by PCP Dr. Tamala Julian for alpha 1. Pt has history of asthma, has occassional wheezing with exersion. Pt is walking 3-5 miles daily.     46 year old female. Here at the request of her brother Pranavi Aure who apparently is my patient. She tells me that she is a lifelong smoker but she has recently tried to cut down. At baseline she does not have much of respiratory complaints except for the COPD cat score of 2 as documented below. This is stable. She's been trying to cut down her smoking. In January 2018 she started having some neck complaints and had a CT scan of the neck but then reported a 4 mm left upper lobe nodule. Her brother then requested that she come and see me. There is also strong family history of alpha 1 antitrypsin deficiency and is another reason why she is here. She is very nervous and anxious about getting test. Initially she declined to have all the test. Later she told me that she will have them. Then later she told me that she has to be out of the office within the next 10-15 minutes and will reschedule them. There is no weight loss or hemoptysis or cough or wheezing     CAT COPD Symptom & Quality of Life Score ([place]) 0 is no burden. 5 is highest burden 12/09/2016   Never Cough -> Cough all the time 0  No phlegm in chest -> Chest is full of phlegm 1  No chest tightness -> Chest feels very tight 0  No dyspnea for 1 flight stairs/hill -> Very dyspneic for 1 flight of stairs 1  No limitations for ADL at home -> Very limited with ADL at home 0  Confident leaving home -> Not at all confident leaving home 0  Sleep soundly -> Do not sleep soundly because of lung condition 0  Lots of Energy -> No energy at all 0  TOTAL Score (max 40)  2    Walking desaturation  test 185 feet 3 laps on room air: Final pulse ox was 96% with a final heart rate of 105/m. Did not desaturate    has a past medical history of Allergy; Anxiety; Arthritis; Asthma; Bipolar 1 disorder ([place]); COPD (chronic obstructive pulmonary disease) ([place]); Depression; Endometriosis; Fibromyalgia; Headache(784.0); Mental disorder; Pituitary tumor; PONV (postoperative nausea and vomiting); PTSD (post-traumatic stress disorder); Scoliosis; Scoliosis; and Termination of pregnancy.   reports that she has been smoking Cigarettes and E-cigarettes.  She has a 14.50 pack-year smoking history. She has never used smokeless tobacco.  Past Surgical History:  Procedure Laterality Date  . BREAST SURGERY    . DILATION AND CURETTAGE OF UTERUS    . FACIAL COSMETIC SURGERY     right cheek  . LAPAROSCOPY Right 11/11/2013   Procedure: LAPAROSCOPY OPERATIVE with  Drainage of  RIGHT Ovarian ENDOMETRIOMA;  Surgeon: Elveria Royals, MD;  Location: [facility];  Service: Gynecology;  Laterality: Right;  . NOSE SURGERY     rhinoplasty at age 61 yrs  . WISDOM TOOTH EXTRACTION      Allergies  Allergen Reactions  . Sulfamethoxazole Anaphylaxis  . Oxycodone Other (See Comments)    Severe blindness?  Renita Papa [Aripiprazole]  Generic Abilify--causes severe neurological problems. Can use name brand  . Penicillins Hives    Has patient had a PCN reaction causing immediate rash, facial/tongue/throat swelling, SOB or lightheadedness with hypotension: no Has patient had a PCN reaction causing severe rash involving mucus membranes or skin necrosis: NO Has patient had a PCN reaction that required hospitalizationNO Has patient had a PCN reaction occurring within the last 10 years:NO If all of the above answers are "NO", then may proceed with Cephalosporin use.   . Other Anxiety    steroids  . Percocet [Oxycodone-Acetaminophen] Palpitations  . Sulfa Antibiotics Hives  . Sulfites Other (See Comments)    unknown     There is no immunization history for the selected administration types on file for this patient.  Family History  Problem Relation Age of Onset  . Diabetes Mother   . Hypertension Mother   . Stroke Mother 59       CVA  . Mental illness Mother        no diagnosis; personality disorder  . Hyperlipidemia Father   . Hypertension Father   . COPD Father   . Alpha-1 antitrypsin deficiency Father   . Alpha-1 antitrypsin deficiency Brother   . Diabetes Maternal Grandmother   . Heart disease Maternal Grandmother   . Hyperlipidemia Maternal Grandmother   . Hypertension Maternal Grandmother   . Mental illness Maternal Grandmother   . Heart disease Maternal Grandfather   . Hyperlipidemia Maternal Grandfather   . Hypertension Maternal Grandfather   . Heart disease Paternal Grandfather   . Hyperlipidemia Paternal Grandfather   . Hypertension Paternal Grandfather   . Stroke Paternal Grandfather      Current Outpatient Prescriptions:  .  albuterol (VENTOLIN HFA) 108 (90 Base) MCG/ACT inhaler, INHALE 2 PUFFS INTO THE LUNGS EVERY 6 HOURS AS NEEDED FOR WHEEZING OR SHORTNESS OF BREATH, Disp: 18 g, Rfl: 2 .  ARIPiprazole (ABILIFY) 5 MG tablet, Take 7.5 mg by mouth every evening. , Disp: , Rfl:  .  celecoxib (CELEBREX) 200 MG capsule, Take 200 mg by mouth daily., Disp: , Rfl:  .  HYDROcodone-acetaminophen (NORCO/VICODIN) 5-325 MG tablet, Take 1 tablet by mouth every 6 (six) hours as needed., Disp: 15 tablet, Rfl: 0 .  ibuprofen (ADVIL,MOTRIN) 800 MG tablet, Take 1 tablet (800 mg total) by mouth every 8 (eight) hours as needed., Disp: 60 tablet, Rfl: 1 .  temazepam (RESTORIL) 30 MG capsule, Take 30 mg by mouth at bedtime as needed for sleep., Disp: , Rfl:  .  alprazolam (XANAX) 2 MG tablet, Take 2 mg by mouth 4 (four) times daily as needed for anxiety. , Disp: , Rfl:     Review of Systems  Constitutional: Negative for fever and unexpected weight change.  HENT: Positive for dental problem.  Negative for congestion, ear pain, nosebleeds, postnasal drip, rhinorrhea, sinus pressure, sneezing, sore throat and trouble swallowing.   Eyes: Negative for redness and itching.  Respiratory: Positive for cough, chest tightness, shortness of breath and wheezing.   Cardiovascular: Negative for palpitations and leg swelling.  Gastrointestinal: Negative for nausea and vomiting.  Genitourinary: Negative for dysuria.  Musculoskeletal: Negative for joint swelling.  Skin: Negative for rash.  Neurological: Positive for dizziness, light-headedness and headaches.  Hematological: Does not bruise/bleed easily.  Psychiatric/Behavioral: Positive for dysphoric mood and sleep disturbance. The patient is nervous/anxious.        Objective:   Physical Exam  Constitutional: She is oriented to person, place, and time. She appears well-developed and  well-nourished. No distress.  HENT:  Head: Normocephalic and atraumatic.  Right Ear: External ear normal.  Left Ear: External ear normal.  Mouth/Throat: Oropharynx is clear and moist. No oropharyngeal exudate.  Eyes: Pupils are equal, round, and reactive to light. Conjunctivae and EOM are normal. Right eye exhibits no discharge. Left eye exhibits no discharge. No scleral icterus.  Neck: Normal range of motion. Neck supple. No JVD present. No tracheal deviation present. No thyromegaly present.  Cardiovascular: Normal rate, regular rhythm, normal heart sounds and intact distal pulses.  Exam reveals no gallop and no friction rub.   No murmur heard. Pulmonary/Chest: Effort normal and breath sounds normal. No respiratory distress. She has no wheezes. She has no rales. She exhibits no tenderness.  Abdominal: Soft. Bowel sounds are normal. She exhibits no distension and no mass. There is no tenderness. There is no rebound and no guarding.  Musculoskeletal: Normal range of motion. She exhibits no edema or tenderness.  Lymphadenopathy:    She has no cervical adenopathy.    Neurological: She is alert and oriented to person, place, and time. She has normal reflexes. No cranial nerve deficit. She exhibits normal muscle tone. Coordination normal.  Skin: Skin is warm and dry. No rash noted. She is not diaphoretic. No erythema. No pallor.  Psychiatric: She has a normal mood and affect. Her behavior is normal. Judgment and thought content normal.  Vitals reviewed.   Vitals:   12/09/16 1139  BP: 136/76  Pulse: 82  SpO2: 99%  Weight: 137 lb (62.1 kg)  Height: 5\' 8"  (1.727 m)    Estimated body mass index is 20.83 kg/m as calculated from the following:   Height as of this encounter: 5\' 8"  (1.727 m).   Weight as of this encounter: 137 lb (62.1 kg).        Assessment & Plan:     ICD-10-CM   1. Smoking history Z87.891   2. Nodule of left lung R91.1   3. Family history of alpha 1 antitrypsin deficiency Z83.49   4. Exertional dyspnea R06.09     Do alpha 1 genetic test  Do full PFT Do CT chest without contrast Work on quitting smoking  Followup Return to see me or APP next few to several weeks but after completing above   Dr. Brand Males, M.D., [facility].C.P Pulmonary and Critical Care Medicine Staff Physician [facility] Pager: [phone], If no answer or between  15:00h - 7:00h: call [phone]  12/09/2016 12:26 PM

## 2016-12-11 DIAGNOSIS — F312 Bipolar disorder, current episode manic severe with psychotic features: Secondary | ICD-10-CM | POA: Diagnosis not present

## 2016-12-11 DIAGNOSIS — F4312 Post-traumatic stress disorder, chronic: Secondary | ICD-10-CM | POA: Diagnosis not present

## 2016-12-16 ENCOUNTER — Inpatient Hospital Stay: Admission: RE | Admit: 2016-12-16 | Payer: Medicare Other | Source: Ambulatory Visit

## 2017-01-02 DIAGNOSIS — N6012 Diffuse cystic mastopathy of left breast: Secondary | ICD-10-CM | POA: Diagnosis not present

## 2017-01-02 DIAGNOSIS — N6011 Diffuse cystic mastopathy of right breast: Secondary | ICD-10-CM | POA: Diagnosis not present

## 2017-01-05 ENCOUNTER — Ambulatory Visit
Admission: RE | Admit: 2017-01-05 | Discharge: 2017-01-05 | Disposition: A | Discharge status: Home / Self Care | Payer: Medicaid Other | Source: Ambulatory Visit | Attending: General Surgery | Admitting: General Surgery

## 2017-01-05 ENCOUNTER — Ambulatory Visit
Admission: RE | Admit: 2017-01-05 | Discharge: 2017-01-05 | Disposition: A | Discharge status: Home / Self Care | Payer: Medicare Other | Source: Ambulatory Visit | Attending: General Surgery | Admitting: General Surgery

## 2017-01-05 DIAGNOSIS — N6489 Other specified disorders of breast: Secondary | ICD-10-CM | POA: Diagnosis not present

## 2017-01-05 DIAGNOSIS — R922 Inconclusive mammogram: Secondary | ICD-10-CM | POA: Diagnosis not present

## 2017-01-05 DIAGNOSIS — N63 Unspecified lump in unspecified breast: Secondary | ICD-10-CM

## 2017-01-07 ENCOUNTER — Other Ambulatory Visit: Payer: Medicaid Other

## 2017-01-07 DIAGNOSIS — N801 Endometriosis of ovary: Secondary | ICD-10-CM | POA: Diagnosis not present

## 2017-01-15 DIAGNOSIS — Z780 Asymptomatic menopausal state: Secondary | ICD-10-CM | POA: Diagnosis not present

## 2017-01-23 ENCOUNTER — Other Ambulatory Visit: Payer: Medicaid Other

## 2017-02-06 ENCOUNTER — Ambulatory Visit: Payer: Medicaid Other | Admitting: Internal Medicine

## 2017-02-11 ENCOUNTER — Ambulatory Visit (INDEPENDENT_AMBULATORY_CARE_PROVIDER_SITE_OTHER): Payer: Medicare Other

## 2017-02-11 ENCOUNTER — Ambulatory Visit (INDEPENDENT_AMBULATORY_CARE_PROVIDER_SITE_OTHER): Payer: Medicare Other | Admitting: Emergency Medicine

## 2017-02-11 ENCOUNTER — Encounter: Payer: Self-pay | Admitting: Emergency Medicine

## 2017-02-11 VITALS — BP 122/78 | HR 82 | Temp 98.2°F | Resp 17 | Ht 68.0 in | Wt 133.0 lb

## 2017-02-11 DIAGNOSIS — R6884 Jaw pain: Secondary | ICD-10-CM

## 2017-02-11 NOTE — Patient Instructions (Signed)
     IF you received an x-ray today, you will receive an invoice from [facility]. Please contact [facility] at [phone] with questions or concerns regarding your invoice.   IF you received labwork today, you will receive an invoice from [facility]. Please contact [facility] at [phone] with questions or concerns regarding your invoice.   Our billing staff will not be able to assist you with questions regarding bills from these companies.  You will be contacted with the lab results as soon as they are available. The fastest way to get your results is to activate your My Chart account. Instructions are located on the last page of this paperwork. If you have not heard from us regarding the results in 2 weeks, please contact this office.     
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
## 2017-02-12 DIAGNOSIS — R590 Localized enlarged lymph nodes: Secondary | ICD-10-CM | POA: Diagnosis not present

## 2017-02-16 ENCOUNTER — Telehealth: Payer: Self-pay | Admitting: Internal Medicine

## 2017-02-16 ENCOUNTER — Ambulatory Visit (INDEPENDENT_AMBULATORY_CARE_PROVIDER_SITE_OTHER)
Admission: RE | Admit: 2017-02-16 | Discharge: 2017-02-16 | Disposition: A | Discharge status: Home / Self Care | Payer: Medicare Other | Source: Ambulatory Visit | Attending: Internal Medicine | Admitting: Internal Medicine

## 2017-02-16 DIAGNOSIS — R0609 Other forms of dyspnea: Secondary | ICD-10-CM

## 2017-02-16 DIAGNOSIS — J439 Emphysema, unspecified: Secondary | ICD-10-CM | POA: Diagnosis not present

## 2017-02-16 DIAGNOSIS — R918 Other nonspecific abnormal finding of lung field: Secondary | ICD-10-CM | POA: Diagnosis not present

## 2017-02-16 DIAGNOSIS — R911 Solitary pulmonary nodule: Secondary | ICD-10-CM

## 2017-02-16 DIAGNOSIS — R0600 Dyspnea, unspecified: Secondary | ICD-10-CM

## 2017-02-16 DIAGNOSIS — Z87891 Personal history of nicotine dependence: Secondary | ICD-10-CM

## 2017-02-16 DIAGNOSIS — Z8349 Family history of other endocrine, nutritional and metabolic diseases: Secondary | ICD-10-CM

## 2017-02-16 NOTE — Telephone Encounter (Signed)
Pt is anxious for results and does not want to wait until 11/15 to discuss.  Pt is concerned that she has lung cancer.  MR please advise.  Thanks.

## 2017-02-16 NOTE — Telephone Encounter (Signed)
Patient called again and is very anxious for results.  I advised her MR has been seeing patients this morning and is not in the office this afternoon.  She is asking we please call her and let her know if results are not back, and if not read, when she should expect them.  I made her aware the note has been sent back to triage and her call will be returned today.

## 2017-02-16 NOTE — Telephone Encounter (Signed)
TECHNIQUE: Multidetector CT imaging of the chest was performed following the standard protocol without IV contrast.  Will dscuss results this week 02/19/17    COMPARISON:  Neck CT 05/04/2016  FINDINGS: Cardiovascular: The heart size is normal. No pericardial effusion. No thoracic aortic aneurysm.  Mediastinum/Nodes: No mediastinal lymphadenopathy. No evidence for gross hilar lymphadenopathy although assessment is limited by the lack of intravenous contrast on today's study. The esophagus has normal imaging features. There is no axillary lymphadenopathy.  Lungs/Pleura: As noted on the prior CT, there are innumerable centrilobular ground-glass nodules scattered throughout both lungs with an upper lung predominance. These changes are superimposed on centrilobular emphysema. 2 mm perifissural nodule noted right lung on image 88 series 3. No focal airspace consolidation. No pulmonary edema or pleural effusion.  Upper Abdomen: Unremarkable.  Musculoskeletal: 9 mm sclerotic focus identified in the right scapula. This finding is stable since chest x-ray of 05/29/2016 suggesting bone island.  IMPRESSION: 1. Similar appearance of innumerable tiny ground-glass nodules with an upper lung predominance and centrilobular distribution. Given smoking history, imaging features are compatible with smoking related lung disease (respiratory bronchiolitis). 2. 9 mm bone island right scapula. 3.  Emphysema. ([id number]-K59.9)   Electronically Signed   By: Misty Stanley M.D.   On: 02/16/2017 13:51

## 2017-02-16 NOTE — Telephone Encounter (Signed)
Spoke with pt, states she is very anxious to receive CT results from 11:00 today.  I advised that the radiologist had not yet read the CT but assured her that we would be contacting her as soon as we received the results.  Pt expressed understanding.  MR Please advise on results.  Thanks.

## 2017-02-17 NOTE — Telephone Encounter (Signed)
I understand her anxiety. None of this is lung cancer. Please reassure. IF she still cannot wait 2 days, please giver her appt with APP  Dr. Brand Males, M.D., [facility].C.P Pulmonary and Critical Care Medicine Staff Physician [facility] Pager: [phone], If no answer or between  15:00h - 7:00h: call [phone]  02/17/2017 10:40 AM

## 2017-02-17 NOTE — Telephone Encounter (Signed)
Advised pt of results. Pt understood and nothing further is needed.   

## 2017-02-19 ENCOUNTER — Ambulatory Visit (INDEPENDENT_AMBULATORY_CARE_PROVIDER_SITE_OTHER): Payer: Medicare Other | Admitting: Internal Medicine

## 2017-02-19 ENCOUNTER — Encounter: Payer: Self-pay | Admitting: Internal Medicine

## 2017-02-19 VITALS — BP 98/64 | HR 77 | Ht 67.25 in | Wt 138.0 lb

## 2017-02-19 DIAGNOSIS — Z8349 Family history of other endocrine, nutritional and metabolic diseases: Secondary | ICD-10-CM

## 2017-02-19 DIAGNOSIS — J439 Emphysema, unspecified: Secondary | ICD-10-CM | POA: Diagnosis not present

## 2017-02-19 DIAGNOSIS — Z87891 Personal history of nicotine dependence: Secondary | ICD-10-CM

## 2017-02-19 DIAGNOSIS — R0609 Other forms of dyspnea: Secondary | ICD-10-CM

## 2017-02-19 DIAGNOSIS — R911 Solitary pulmonary nodule: Secondary | ICD-10-CM | POA: Diagnosis not present

## 2017-02-19 DIAGNOSIS — R918 Other nonspecific abnormal finding of lung field: Secondary | ICD-10-CM

## 2017-02-19 DIAGNOSIS — R0600 Dyspnea, unspecified: Secondary | ICD-10-CM

## 2017-02-19 NOTE — Progress Notes (Signed)
PFT done today. 

## 2017-02-19 NOTE — Patient Instructions (Addendum)
ICD-10-CM   1. Family history of alpha 1 antitrypsin deficiency Z83.49   2. Smoking history Z87.891   3. Pulmonary emphysema, unspecified emphysema type ([place]) J43.9   4. Lung nodule seen on imaging study R91.1     Expectant approach for all the above issues as discussed  followup As needed

## 2017-02-19 NOTE — Progress Notes (Signed)
Subjective:     Patient ID: Catherine Olsen, female   DOB: [birth year], 46 y.o.   MRN: [id number]  HPI  PCP Wardell Honour, MD   IOV 12/09/2016  Chief Complaint  Patient presents with  . Advice Only    Pt referred by PCP Dr. Tamala Julian for alpha 1. Pt has history of asthma, has occassional wheezing with exersion. Pt is walking 3-5 miles daily.     46 year old female. Here at the request of her brother Aela Bohan who apparently is my patient. She tells me that she is a lifelong smoker but she has recently tried to cut down. At baseline she does not have much of respiratory complaints except for the COPD cat score of 2 as documented below. This is stable. She's been trying to cut down her smoking. In January 2018 she started having some neck complaints and had a CT scan of the neck but then reported a 4 mm left upper lobe nodule. Her brother then requested that she come and see me. There is also strong family history of alpha 1 antitrypsin deficiency and is another reason why she is here. She is very nervous and anxious about getting test. Initially she declined to have all the test. Later she told me that she will have them. Then later she told me that she has to be out of the office within the next 10-15 minutes and will reschedule them. There is no weight loss or hemoptysis or cough or wheezing    OV 02/19/2017  Chief Complaint  Patient presents with  . Follow-up    feeling well, walks 5 miles a day, trying to quit smoking with nicotine gum. not feeling SOB   46 year old female here for follow-up of multiple issues  #Smoking: She continues to smoke.  She says she will quit on her own.  She does not want any help.  She knows that she needs to quit  #Family history of alpha-1: She thinks her brother has MZ phenotype. .  She says she does not take any alcohol and she does not have any biologic kids.  She also says she plans to quit smoking.  She therefore think she is not at risk for cirrhosis.  She  does not therefore want to do alpha-1 antitrypsin genetic testing.  She also says that the anxiety about this is not something she wants to deal with.  #History of asthma: She tells me this time that she has a history of asthma although she is no wheezing and has got good effort tolerance.  We did pulmonary function test that the spirometry does show prebronchodilator FEV1 at 79% in response to 89% which is a 12% response with bronchodilator consistent with features of asthma.  The CT scan does show some emphysema and associated with this the DLCO is low.  Therefore she probably has mixed disease  #History of lung nodule: The CT scan of the chest done later shows some micronodules in the upper lobes.  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  Patient herself does not have any cough.  She is not interested in follow-up CT scans because she believes the risk for cancer is low.     CAT COPD Symptom & Quality of Life Score (GSK trademark) 0 is no burden. 5 is highest burden 12/09/2016   Never Cough -> Cough all the time 0  No phlegm in chest -> Chest is full of phlegm 1  No chest tightness -> Chest feels very tight 0  No dyspnea for 1 flight stairs/hill -> Very dyspneic for 1 flight of stairs 1  No limitations for ADL at home -> Very limited with ADL at home 0  Confident leaving home -> Not at all confident leaving home 0  Sleep soundly -> Do not sleep soundly because of lung condition 0  Lots of Energy -> No energy at all 0  TOTAL Score (max 40)  2    Walking desaturation test 185 feet 3 laps on room air: Final pulse ox was 96% with a final heart rate of 105/m. Did not desaturate    Ct Chest Wo Contrast  Result Date: 02/16/2017 CLINICAL DATA:  Pulmonary nodule. EXAM: CT CHEST WITHOUT CONTRAST TECHNIQUE: Multidetector CT imaging of the chest was performed following the standard protocol without IV contrast. COMPARISON:  Neck CT 05/04/2016 FINDINGS: Cardiovascular: The heart size is normal. No  pericardial effusion. No thoracic aortic aneurysm. Mediastinum/Nodes: No mediastinal lymphadenopathy. No evidence for gross hilar lymphadenopathy although assessment is limited by the lack of intravenous contrast on today's study. The esophagus has normal imaging features. There is no axillary lymphadenopathy. Lungs/Pleura: As noted on the prior CT, there are innumerable centrilobular ground-glass nodules scattered throughout both lungs with an upper lung predominance. These changes are superimposed on centrilobular emphysema. 2 mm perifissural nodule noted right lung on image 88 series 3. No focal airspace consolidation. No pulmonary edema or pleural effusion. Upper Abdomen: Unremarkable. Musculoskeletal: 9 mm sclerotic focus identified in the right scapula. This finding is stable since chest x-ray of 05/29/2016 suggesting bone island. IMPRESSION: 1. Similar appearance of innumerable tiny ground-glass nodules with an upper lung predominance and centrilobular distribution. Given smoking history, imaging features are compatible with smoking related lung disease (respiratory bronchiolitis). 2. 9 mm bone island right scapula. 3.  Emphysema. (CZY60-Y30.9) Electronically Signed   By: Misty Stanley M.D.   On: 02/16/2017 13:51     Review of Systems     Objective:   Physical Exam  Constitutional: She is oriented to person, place, and time. She appears well-developed and well-nourished. No distress.  HENT:  Head: Normocephalic and atraumatic.  Right Ear: External ear normal.  Left Ear: External ear normal.  Mouth/Throat: Oropharynx is clear and moist. No oropharyngeal exudate.  Eyes: Conjunctivae and EOM are normal. Pupils are equal, round, and reactive to light. Right eye exhibits no discharge. Left eye exhibits no discharge. No scleral icterus.  Neck: Normal range of motion. Neck supple. No JVD present. No tracheal deviation present. No thyromegaly present.  Cardiovascular: Normal rate, regular rhythm,  normal heart sounds and intact distal pulses. Exam reveals no gallop and no friction rub.  No murmur heard. Pulmonary/Chest: Effort normal and breath sounds normal. No respiratory distress. She has no wheezes. She has no rales. She exhibits no tenderness.  Abdominal: Soft. Bowel sounds are normal. She exhibits no distension and no mass. There is no tenderness. There is no rebound and no guarding.  Musculoskeletal: Normal range of motion. She exhibits no edema or tenderness.  Lymphadenopathy:    She has no cervical adenopathy.  Neurological: She is alert and oriented to person, place, and time. She has normal reflexes. No cranial nerve deficit. She exhibits normal muscle tone. Coordination normal.  Skin: Skin is warm and dry. No rash noted. She is not diaphoretic. No erythema. No pallor.  Psychiatric: She has a normal mood and affect. Her behavior is normal. Judgment and thought content normal.  Vitals reviewed.  Vitals:   02/19/17 1217  BP:  98/64  Pulse: 77  SpO2: 98%  Weight: 138 lb (62.6 kg)  Height: 5' 7.25" (1.708 m)       Assessment:       ICD-10-CM   1. Family history of alpha 1 antitrypsin deficiency Z83.49 Alpha-1 antitrypsin phenotype  2. Smoking history Z87.891   3. Pulmonary emphysema, unspecified emphysema type ([place]) J43.9   4. Lung nodule seen on imaging study R91.1        Plan:      We had an extensive discussion.  She refused alpha-1 genetic testing.  Offered help quitting smoking but she refused.  Discussed inhalers of Spiriva or inhaled steroid for emphysema/asthma which she refused.  We discussed follow-up imaging for the lung nodules which she refused.  She said to keep the open ended follow-up in 1 year   Dr. Brand Males, M.D., [facility].C.P Pulmonary and Critical Care Medicine Staff Physician [facility] Pager: [phone], If no answer or between  15:00h - 7:00h: call [phone]  02/19/2017 1:03  PM

## 2017-02-23 DIAGNOSIS — F3131 Bipolar disorder, current episode depressed, mild: Secondary | ICD-10-CM | POA: Diagnosis not present

## 2017-02-23 DIAGNOSIS — F4312 Post-traumatic stress disorder, chronic: Secondary | ICD-10-CM | POA: Diagnosis not present

## 2017-03-10 DIAGNOSIS — N6012 Diffuse cystic mastopathy of left breast: Secondary | ICD-10-CM | POA: Diagnosis not present

## 2017-03-10 DIAGNOSIS — N6011 Diffuse cystic mastopathy of right breast: Secondary | ICD-10-CM | POA: Diagnosis not present

## 2017-03-18 ENCOUNTER — Other Ambulatory Visit: Payer: Self-pay

## 2017-03-18 ENCOUNTER — Encounter: Payer: Self-pay | Admitting: Family Medicine

## 2017-03-18 ENCOUNTER — Ambulatory Visit (INDEPENDENT_AMBULATORY_CARE_PROVIDER_SITE_OTHER): Payer: Medicare Other | Admitting: Family Medicine

## 2017-03-18 VITALS — BP 102/70 | HR 78 | Resp 16 | Ht 67.0 in | Wt 138.0 lb

## 2017-03-18 DIAGNOSIS — L989 Disorder of the skin and subcutaneous tissue, unspecified: Secondary | ICD-10-CM

## 2017-03-18 DIAGNOSIS — Z87891 Personal history of nicotine dependence: Secondary | ICD-10-CM | POA: Diagnosis not present

## 2017-03-18 DIAGNOSIS — N6011 Diffuse cystic mastopathy of right breast: Secondary | ICD-10-CM | POA: Diagnosis not present

## 2017-03-18 DIAGNOSIS — G43709 Chronic migraine without aura, not intractable, without status migrainosus: Secondary | ICD-10-CM | POA: Diagnosis not present

## 2017-03-18 DIAGNOSIS — M545 Low back pain, unspecified: Secondary | ICD-10-CM

## 2017-03-18 DIAGNOSIS — F3160 Bipolar disorder, current episode mixed, unspecified: Secondary | ICD-10-CM

## 2017-03-18 DIAGNOSIS — L570 Actinic keratosis: Secondary | ICD-10-CM | POA: Diagnosis not present

## 2017-03-18 DIAGNOSIS — L986 Other infiltrative disorders of the skin and subcutaneous tissue: Secondary | ICD-10-CM | POA: Diagnosis not present

## 2017-03-18 DIAGNOSIS — G8929 Other chronic pain: Secondary | ICD-10-CM

## 2017-03-18 MED ORDER — HYDROCODONE-ACETAMINOPHEN 5-325 MG PO TABS: 1.0000 | ORAL_TABLET | Freq: Four times a day (QID) | ORAL | 0 refills | Status: DC | PRN

## 2017-03-18 MED ORDER — IBUPROFEN 800 MG PO TABS: 800.0000 mg | ORAL_TABLET | Freq: Three times a day (TID) | ORAL | 1 refills | Status: DC | PRN

## 2017-03-18 NOTE — Patient Instructions (Signed)
     IF you received an x-ray today, you will receive an invoice from [facility]. Please contact [facility] at [phone] with questions or concerns regarding your invoice.   IF you received labwork today, you will receive an invoice from [facility]. Please contact [facility] at [phone] with questions or concerns regarding your invoice.   Our billing staff will not be able to assist you with questions regarding bills from these companies.  You will be contacted with the lab results as soon as they are available. The fastest way to get your results is to activate your My Chart account. Instructions are located on the last page of this paperwork. If you have not heard from us regarding the results in 2 weeks, please contact this office.     

## 2017-03-18 NOTE — Progress Notes (Signed)
Subjective:    Patient ID: Catherine Olsen, female    DOB: [birth year], 46 y.o.   MRN: [id number]  03/18/2017  Nevus (pt had several mole she would like the provider to look at and possible removal)    HPI This 46 y.o. female presents for skin lesion RIGHT LEG.  Long history of tobacco abuse; thus, very concerned of melanoma.   Skin tag in vulvar region.  Would like provider to review.  No too worried about it.  Quit smoking seven days ago.  Fibrocystic breast disease:  Followed by general surgery yearly; s/p recent mammogram and follow-up visit.  Migraines: requesting a small amount of hydrocodone for migraines.    Chronic lower back pain: requesting refill of Ibuprofen 800mg .   BP Readings from Last 3 Encounters:  03/18/17 102/70  02/19/17 98/64  02/11/17 122/78   Wt Readings from Last 3 Encounters:  03/18/17 138 lb (62.6 kg)  02/19/17 138 lb (62.6 kg)  02/11/17 133 lb (60.3 kg)   There is no immunization history for the selected administration types on file for this patient.  Review of Systems  Constitutional: Negative for chills, diaphoresis, fatigue and fever.  HENT: Negative for ear pain, postnasal drip, rhinorrhea, sinus pressure, sore throat and trouble swallowing.   Respiratory: Negative for cough and shortness of breath.   Cardiovascular: Negative for chest pain, palpitations and leg swelling.  Gastrointestinal: Negative for abdominal pain, constipation, diarrhea, nausea and vomiting.  Musculoskeletal: Positive for back pain.  Skin: Positive for color change.  Neurological: Positive for headaches.    Past Medical History:  Diagnosis Date  . Allergy   . Anxiety   . Arthritis    hands, lower back, knees  . Asthma   . Bipolar 1 disorder ([place])   . COPD (chronic obstructive pulmonary disease) ([place])   . Depression   . Endometriosis   . Fibromyalgia   . Headache(784.0)    otc med prn  . Migraine   . Pituitary tumor   . PONV (postoperative nausea and  vomiting)   . PTSD (post-traumatic stress disorder)   . Scoliosis   . Termination of pregnancy    x 2 at age 27 and 46 yrs old   Past Surgical History:  Procedure Laterality Date  . BREAST SURGERY    . DILATION AND CURETTAGE OF UTERUS    . FACIAL COSMETIC SURGERY     right cheek  . LAPAROSCOPY Right 11/11/2013   Procedure: LAPAROSCOPY OPERATIVE with  Drainage of  RIGHT Ovarian ENDOMETRIOMA;  Surgeon: Elveria Royals, MD;  Location: [facility];  Service: Gynecology;  Laterality: Right;  . NOSE SURGERY     rhinoplasty at age 45 yrs  . WISDOM TOOTH EXTRACTION     Allergies  Allergen Reactions  . Sulfamethoxazole Anaphylaxis  . Oxycodone Other (See Comments)    Severe blindness?  . Abilify [Aripiprazole]     Generic Abilify--causes severe neurological problems. Can use name brand  . Penicillins Hives    Has patient had a PCN reaction causing immediate rash, facial/tongue/throat swelling, SOB or lightheadedness with hypotension: no Has patient had a PCN reaction causing severe rash involving mucus membranes or skin necrosis: NO Has patient had a PCN reaction that required hospitalizationNO Has patient had a PCN reaction occurring within the last 10 years:NO If all of the above answers are "NO", then may proceed with Cephalosporin use.   . Other Anxiety    steroids  . Percocet [Oxycodone-Acetaminophen] Palpitations  . Sulfa  Antibiotics Hives  . Sulfites Other (See Comments)    unknown   Current Outpatient Medications on File Prior to Visit  Medication Sig Dispense Refill  . albuterol (VENTOLIN HFA) 108 (90 Base) MCG/ACT inhaler INHALE 2 PUFFS INTO THE LUNGS EVERY 6 HOURS AS NEEDED FOR WHEEZING OR SHORTNESS OF BREATH 18 g 2  . celecoxib (CELEBREX) 200 MG capsule Take 200 mg by mouth daily.    Marland Kitchen estradiol (CLIMARA - DOSED IN MG/24 HR) 0.025 mg/24hr patch APP 1 PA EXT TO THE SKIN Q 3 DAYS  12  . progesterone (PROMETRIUM) 200 MG capsule TK ONE C PO  ONCE A DAY X 10 DAYS  3  . temazepam  (RESTORIL) 30 MG capsule Take 30 mg by mouth at bedtime as needed for sleep.    . [CANCELED] beclomethasone (QVAR) 40 MCG/ACT inhaler Use two puffs twice daily for an asthma flare.  Rinse, gargle and spit after use. 1 Inhaler 2   No current facility-administered medications on file prior to visit.    Social History   Socioeconomic History  . Marital status: Divorced    Spouse name: Not on file  . Number of children: 0  . Years of education: Not on file  . Highest education level: Not on file  Social Needs  . Financial resource strain: Not on file  . Food insecurity - worry: Not on file  . Food insecurity - inability: Not on file  . Transportation needs - medical: Not on file  . Transportation needs - non-medical: Not on file  Occupational History  . Occupation: disability    Comment: mental illness  Tobacco Use  . Smoking status: Current Every Day Smoker    Packs/day: 0.50    Years: 29.00    Pack years: 14.50    Types: Cigarettes, E-cigarettes  . Smokeless tobacco: Never Used  Substance and Sexual Activity  . Alcohol use: Yes    Comment: "  . Drug use: Yes    Types: "Crack" cocaine, Cocaine    Comment: Heavy user per patient, last use Tuesday 11/08/13  LAST CRACK  2015  . Sexual activity: No    Comment: abortion at 80 and 68yrs. of age   Other Topics Concern  . Not on file  Social History Narrative   Marital status: divorced; not dating      Children: none      Lives: alone with mom      Employment: disability for mental illness in 1997.  Hospitalizations x 9 in past.      Tobacco: 1 ppd x since age 20. Decreasing in 2018.      Alcohol: socially; beers.      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      Exercise: yoga daily; exercise daily.      ADLs: independent with ADLs; no car; depends on others for transportation.   Family History  Problem Relation Age of Onset  . Diabetes Mother   . Hypertension Mother   . Stroke Mother 62       CVA  .  Mental illness Mother        no diagnosis; personality disorder  . Hyperlipidemia Father   . Hypertension Father   . COPD Father   . Alpha-1 antitrypsin deficiency Father   . Alpha-1 antitrypsin deficiency Brother   . Diabetes Maternal Grandmother   . Heart disease Maternal Grandmother   . Hyperlipidemia Maternal Grandmother   . Hypertension Maternal Grandmother   .  Mental illness Maternal Grandmother   . Heart disease Maternal Grandfather   . Hyperlipidemia Maternal Grandfather   . Hypertension Maternal Grandfather   . Heart disease Paternal Grandfather   . Hyperlipidemia Paternal Grandfather   . Hypertension Paternal Grandfather   . Stroke Paternal Grandfather        Objective:    BP 102/70   Pulse 78   Resp 16   Ht 5\' 7"  (1.702 m)   Wt 138 lb (62.6 kg)   LMP 11/21/2014 Comment: Pt. states she is having bleeding today and has been for 9 days.  SpO2 97%   BMI 21.61 kg/m  Physical Exam  Constitutional: She is oriented to person, place, and time. She appears well-developed and well-nourished. No distress.  HENT:  Head: Normocephalic and atraumatic.  Eyes: Conjunctivae are normal. Pupils are equal, round, and reactive to light.  Neck: Normal range of motion. Neck supple.  Cardiovascular: Normal rate, regular rhythm and normal heart sounds. Exam reveals no gallop and no friction rub.  No murmur heard. Pulmonary/Chest: Effort normal and breath sounds normal. She has no wheezes. She has no rales.  Musculoskeletal:       Lumbar back: Normal.  Neurological: She is alert and oriented to person, place, and time.  Skin: She is not diaphoretic.     R posterior leg with 14mm maculopapular lesion with slight color variation. No irregular borders.  Along suprapubic region, varrucous skin tag present without color variation.   Psychiatric: She has a normal mood and affect. Her behavior is normal.  Nursing note and vitals reviewed.  No results found. Depression screen [facility] 2/9  03/18/2017 02/11/2017 09/04/2016 12/26/2015 09/19/2015  Decreased Interest 0 0 1 0 1  Down, Depressed, Hopeless 0 0 1 0 1  PHQ - 2 Score 0 0 2 0 2  Altered sleeping - - - - 1  Tired, decreased energy - - - - 1  Change in appetite - - - - 1  Feeling bad or failure about yourself  - - - - 1  Trouble concentrating - - - - 1  Moving slowly or fidgety/restless - - - - 1  Suicidal thoughts - - - - 0  PHQ-9 Score - - - - 8  Difficult doing work/chores - - - - Extremely dIfficult  Some recent data might be hidden   Fall Risk  03/18/2017 02/11/2017 09/04/2016 05/29/2016 12/26/2015  Falls in the past year? No No No No No   PROCEDURE: VERBAL CONSENT OBTAINED; AREA CLEANSED; 2% LIDOCAINE WITH EPI 0.25CC ADMINISTERED AT BASE OF R POSTERIOR LEG SKIN LESION; SHAVE BIOPSY PERFORMED TO EXCISE SKIN LESION; GOOD HEMOSTASIS WITH PRESSURE BEING APPLIED TO AREA.  BANDAGE APPLIED. PATIENT TOLERATED WELL.     Assessment & Plan:   1. Skin lesion of right leg   2. Fibrocystic disease of right breast   3. 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 Chronic midline low back pain without sciatica   5. Chronic migraine without aura without status migrainosus, not intractable   6. Smoking history     New onset skin lesion at right posterior leg with color variation.  Status post shave biopsy in office.  Patient tolerated well.  Sent for pathology.  Chronic fibrocystic breast disease.  Status post recent follow-up with general surgery with mammography.  Smoking cessation will help with pain related to fibrocystic breast disease.  Recurrent low back pain.  Refill of ibuprofen 800 mg provided for as needed use.  Continue  home exercise program as well.  Chronic migraines.  Agreeable to providing patient with 20 hydrocodone to last for the next 3 months.  Congratulations on smoking cessation 7 days ago.  Physical dependence no longer an issue.  Encouraged ongoing smoking cessation.  Status post recent pulmonology consultation  with diagnosis of COPD.  Also has lung nodules present.  Discussed and evaluated further by pulmonology.  Patient refused testing for alpha-1 antitrypsin.  Order is placed in chart if she would like to return to pulmonology for lab testing.  No orders of the defined types were placed in this encounter.  Meds ordered this encounter  Medications  . ibuprofen (ADVIL,MOTRIN) 800 MG tablet    Sig: Take 1 tablet (800 mg total) by mouth every 8 (eight) hours as needed.    Dispense:  60 tablet    Refill:  1  . HYDROcodone-acetaminophen (NORCO) 5-325 MG tablet    Sig: Take 1 tablet by mouth every 6 (six) hours as needed.    Dispense:  15 tablet    Refill:  0    No Follow-up on file.   Tomesha Sargent Elayne Guerin, M.D. Primary Care at [facility] [address] [place], [place]  [phone] phone [phone] fax

## 2017-03-24 ENCOUNTER — Encounter: Payer: Self-pay | Admitting: Family Medicine

## 2017-03-24 DIAGNOSIS — G43709 Chronic migraine without aura, not intractable, without status migrainosus: Secondary | ICD-10-CM | POA: Insufficient documentation

## 2017-03-24 DIAGNOSIS — G8929 Other chronic pain: Secondary | ICD-10-CM | POA: Insufficient documentation

## 2017-03-24 DIAGNOSIS — M545 Low back pain: Secondary | ICD-10-CM

## 2017-03-25 ENCOUNTER — Encounter: Payer: Self-pay | Admitting: Family Medicine

## 2017-03-25 ENCOUNTER — Other Ambulatory Visit: Payer: Self-pay

## 2017-03-25 ENCOUNTER — Ambulatory Visit (INDEPENDENT_AMBULATORY_CARE_PROVIDER_SITE_OTHER): Payer: Medicare Other | Admitting: Family Medicine

## 2017-03-25 VITALS — BP 102/70 | HR 94 | Resp 16 | Ht 67.0 in | Wt 138.0 lb

## 2017-03-25 DIAGNOSIS — J431 Panlobular emphysema: Secondary | ICD-10-CM

## 2017-03-25 DIAGNOSIS — R918 Other nonspecific abnormal finding of lung field: Secondary | ICD-10-CM

## 2017-03-25 DIAGNOSIS — Z8349 Family history of other endocrine, nutritional and metabolic diseases: Secondary | ICD-10-CM

## 2017-03-25 DIAGNOSIS — S0340XA Sprain of jaw, unspecified side, initial encounter: Secondary | ICD-10-CM

## 2017-03-25 DIAGNOSIS — Z720 Tobacco use: Secondary | ICD-10-CM

## 2017-03-25 DIAGNOSIS — K089 Disorder of teeth and supporting structures, unspecified: Secondary | ICD-10-CM

## 2017-03-25 MED ORDER — CLINDAMYCIN HCL 150 MG PO CAPS: 150.0000 mg | ORAL_CAPSULE | Freq: Three times a day (TID) | ORAL | 0 refills | Status: DC

## 2017-03-25 MED ORDER — PREDNISONE 20 MG PO TABS: ORAL_TABLET | ORAL | 0 refills | Status: DC

## 2017-03-25 NOTE — Patient Instructions (Addendum)
IF you received an x-ray today, you will receive an invoice from [facility]. Please contact [facility] at [phone] with questions or concerns regarding your invoice.   IF you received labwork today, you will receive an invoice from [facility]. Please contact [facility] at [phone] with questions or concerns regarding your invoice.   Our billing staff will not be able to assist you with questions regarding bills from these companies.  You will be contacted with the lab results as soon as they are available. The fastest way to get your results is to activate your My Chart account. Instructions are located on the last page of this paperwork. If you have not heard from [place] regarding the results in 2 weeks, please contact this office.     Temporomandibular Joint Syndrome Temporomandibular joint (TMJ) syndrome is a condition that affects the joints between your jaw and your skull. The TMJs are located near your ears and allow your jaw to open and close. These joints and the nearby muscles are involved in all movements of the jaw. People with TMJ syndrome have pain in the area of these joints and muscles. Chewing, biting, or other movements of the jaw can be difficult or painful. TMJ syndrome can be caused by various things. In many cases, the condition is mild and goes away within a few weeks. For some people, the condition can become a long-term problem. What are the causes? Possible causes of TMJ syndrome include:  Grinding your teeth or clenching your jaw. Some people do this when they are under stress.  Arthritis.  Injury to the jaw.  Head or neck injury.  Teeth or dentures that are not aligned well.  In some cases, the cause of TMJ syndrome may not be known. What are the signs or symptoms? The most common symptom is an aching pain on the side of the head in the area of the TMJ. Other symptoms may include:  Pain when moving your jaw, such as when chewing or  biting.  Being unable to open your jaw all the way.  Making a clicking sound when you open your mouth.  Headache.  Earache.  Neck or shoulder pain.  How is this diagnosed? Diagnosis can usually be made based on your symptoms, your medical history, and a physical exam. Your health care provider may check the range of motion of your jaw. Imaging tests, such as X-rays or an MRI, are sometimes done. You may need to see your dentist to determine if your teeth and jaw are lined up correctly. How is this treated? TMJ syndrome often goes away on its own. If treatment is needed, the options may include:  Eating soft foods and applying ice or heat.  Medicines to relieve pain or inflammation.  Medicines to relax the muscles.  A splint, bite plate, or mouthpiece to prevent teeth grinding or jaw clenching.  Relaxation techniques or counseling to help reduce stress.  Transcutaneous electrical nerve stimulation (TENS). This helps to relieve pain by applying an electrical current through the skin.  Acupuncture. This is sometimes helpful to relieve pain.  Jaw surgery. This is rarely needed.  Follow these instructions at home:  Take medicines only as directed by your health care provider.  Eat a soft diet if you are having trouble chewing.  Apply ice to the painful area. ? Put ice in a plastic bag. ? Place a towel between your skin and the bag. ? Leave the ice on for 20 minutes, 2-3  times a day.  Apply a warm compress to the painful area as directed.  Massage your jaw area and perform any jaw stretching exercises as recommended by your health care provider.  If you were given a mouthpiece or bite plate, wear it as directed.  Avoid foods that require a lot of chewing. Do not chew gum.  Keep all follow-up visits as directed by your health care provider. This is important. Contact a health care provider if:  You are having trouble eating.  You have new or worsening symptoms. Get  help right away if:  Your jaw locks open or closed. This information is not intended to replace advice given to you by your health care provider. Make sure you discuss any questions you have with your health care provider. Document Released: 12/17/2000 Document Revised: 11/22/2015 Document Reviewed: 10/27/2013 Elsevier Interactive Patient Education  [employer].

## 2017-03-25 NOTE — Progress Notes (Signed)
Subjective:    Patient ID: Catherine Olsen, female    DOB: [birth year], 46 y.o.   MRN: [id number]  03/25/2017  Temporomandibular Joint Pain (follow-up )    HPI This 46 y.o. female presents for evaluation of recurrent TMJ swelling and pain.  Has been chewing nicotine gum and attempts of tobacco cessation.  Developed TMJ pain recently.  Has suffered with very similar symptoms in the past as well.  Previous treatment included prednisone therapy which was very effective.  Denies fever, chills, sweats.  Denies sore throat, ear pain, rhinorrhea, nasal congestion.  Denies dental pain or mouth sores.  Determined to quit smoking due to fear of malignancy.  Status post pulmonology consultation in the past 3 months due to brother with antitrypsin deficiency.  Patient also with nodules on CT chest.  Underwent repeat CT chest and evaluation by pulmonology.  Lung nodule stable without enlargement thus consistent with benign etiology.  During time of visit with pulmonologist, patient refused antitrypsin deficiency screening. CT chest showed emphysematous changes.    BP Readings from Last 3 Encounters:  04/11/17 132/78  03/25/17 102/70  03/18/17 102/70   Wt Readings from Last 3 Encounters:  04/10/17 138 lb (62.6 kg)  03/25/17 138 lb (62.6 kg)  03/18/17 138 lb (62.6 kg)   There is no immunization history for the selected administration types on file for this patient.  Review of Systems  Constitutional: Negative for chills, diaphoresis, fatigue and fever.  HENT: Positive for facial swelling. Negative for congestion, dental problem, drooling, ear discharge, ear pain, hearing loss, mouth sores, nosebleeds, postnasal drip, rhinorrhea, sinus pressure, sinus pain, sneezing, sore throat, tinnitus, trouble swallowing and voice change.   Respiratory: Negative for cough and shortness of breath.   Cardiovascular: Negative for chest pain, palpitations and leg swelling.  Gastrointestinal: Negative for abdominal  pain, constipation, diarrhea, nausea and vomiting.    Past Medical History:  Diagnosis Date  . Allergy   . Anxiety   . Arthritis    hands, lower back, knees  . Asthma   . Bipolar 1 disorder ([place])   . COPD (chronic obstructive pulmonary disease) ([place])   . Depression   . Endometriosis   . Fibromyalgia   . Headache(784.0)    otc med prn  . Migraine   . Pituitary tumor   . PONV (postoperative nausea and vomiting)   . PTSD (post-traumatic stress disorder)   . Scoliosis   . Termination of pregnancy    x 2 at age 69 and 46 yrs old   Past Surgical History:  Procedure Laterality Date  . BREAST SURGERY    . DILATION AND CURETTAGE OF UTERUS    . FACIAL COSMETIC SURGERY     right cheek  . LAPAROSCOPY Right 11/11/2013   Procedure: LAPAROSCOPY OPERATIVE with  Drainage of  RIGHT Ovarian ENDOMETRIOMA;  Surgeon: Elveria Royals, MD;  Location: [facility];  Service: Gynecology;  Laterality: Right;  . NOSE SURGERY     rhinoplasty at age 25 yrs  . WISDOM TOOTH EXTRACTION     Allergies  Allergen Reactions  . Sulfamethoxazole Anaphylaxis  . Oxycodone Other (See Comments)    Severe blindness?  . Abilify [Aripiprazole]     Generic Abilify--causes severe neurological problems. Can use name brand  . Penicillins Hives    Has patient had a PCN reaction causing immediate rash, facial/tongue/throat swelling, SOB or lightheadedness with hypotension: no Has patient had a PCN reaction causing severe rash involving mucus membranes or skin necrosis:  NO Has patient had a PCN reaction that required hospitalizationNO Has patient had a PCN reaction occurring within the last 10 years:NO If all of the above answers are "NO", then may proceed with Cephalosporin use.   . Other Anxiety    steroids  . Percocet [Oxycodone-Acetaminophen] Palpitations  . Sulfa Antibiotics Hives  . Sulfites Other (See Comments)    unknown   Current Outpatient Medications on File Prior to Visit  Medication Sig Dispense Refill  .  albuterol (VENTOLIN HFA) 108 (90 Base) MCG/ACT inhaler INHALE 2 PUFFS INTO THE LUNGS EVERY 6 HOURS AS NEEDED FOR WHEEZING OR SHORTNESS OF BREATH 18 g 2  . alprazolam (XANAX) 2 MG tablet TK 1 T PO BID PRF SEVERE ANXIETY/AGITATION  1  . celecoxib (CELEBREX) 200 MG capsule Take 200 mg by mouth daily.    Marland Kitchen estradiol (CLIMARA - DOSED IN MG/24 HR) 0.025 mg/24hr patch APP 1 PA EXT TO THE SKIN Q 3 DAYS  12  . HYDROcodone-acetaminophen (NORCO) 5-325 MG tablet Take 1 tablet by mouth every 6 (six) hours as needed. 15 tablet 0  . ibuprofen (ADVIL,MOTRIN) 800 MG tablet Take 1 tablet (800 mg total) by mouth every 8 (eight) hours as needed. 60 tablet 1  . progesterone (PROMETRIUM) 200 MG capsule TK ONE C PO  ONCE A DAY X 10 DAYS  3  . temazepam (RESTORIL) 30 MG capsule Take 30 mg by mouth at bedtime as needed for sleep.    . [CANCELED] beclomethasone (QVAR) 40 MCG/ACT inhaler Use two puffs twice daily for an asthma flare.  Rinse, gargle and spit after use. 1 Inhaler 2   No current facility-administered medications on file prior to visit.    Social History   Socioeconomic History  . Marital status: Divorced    Spouse name: Not on file  . Number of children: 0  . Years of education: Not on file  . Highest education level: Not on file  Social Needs  . Financial resource strain: Not on file  . Food insecurity - worry: Not on file  . Food insecurity - inability: Not on file  . Transportation needs - medical: Not on file  . Transportation needs - non-medical: Not on file  Occupational History  . Occupation: disability    Comment: mental illness  Tobacco Use  . Smoking status: Current Every Day Smoker    Packs/day: 0.50    Years: 29.00    Pack years: 14.50    Types: Cigarettes, E-cigarettes  . Smokeless tobacco: Never Used  Substance and Sexual Activity  . Alcohol use: Yes    Comment: "  . Drug use: Yes    Types: "Crack" cocaine, Cocaine    Comment: Heavy user per patient, last use Tuesday 11/08/13   LAST CRACK  2015  . Sexual activity: No    Comment: abortion at 3 and 65yrs. of age   Other Topics Concern  . Not on file  Social History Narrative   Marital status: divorced; not dating      Children: none      Lives: alone with mom      Employment: disability for mental illness in 1997.  Hospitalizations x 9 in past.      Tobacco: 1 ppd x since age 63. Decreasing in 2018.      Alcohol: socially; beers.      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      Exercise: yoga daily; exercise daily.  ADLs: independent with ADLs; no car; depends on others for transportation.   Family History  Problem Relation Age of Onset  . Diabetes Mother   . Hypertension Mother   . Stroke Mother 33       CVA  . Mental illness Mother        no diagnosis; personality disorder  . Hyperlipidemia Father   . Hypertension Father   . COPD Father   . Alpha-1 antitrypsin deficiency Father   . Alpha-1 antitrypsin deficiency Brother   . Diabetes Maternal Grandmother   . Heart disease Maternal Grandmother   . Hyperlipidemia Maternal Grandmother   . Hypertension Maternal Grandmother   . Mental illness Maternal Grandmother   . Heart disease Maternal Grandfather   . Hyperlipidemia Maternal Grandfather   . Hypertension Maternal Grandfather   . Heart disease Paternal Grandfather   . Hyperlipidemia Paternal Grandfather   . Hypertension Paternal Grandfather   . Stroke Paternal Grandfather        Objective:    BP 102/70   Pulse 94   Resp 16   Ht 5\' 7"  (1.702 m)   Wt 138 lb (62.6 kg)   LMP 11/21/2014 Comment: Pt. states she is having bleeding today and has been for 9 days.  SpO2 98%   BMI 21.61 kg/m  Physical Exam  Constitutional: She is oriented to person, place, and time. She appears well-developed and well-nourished. No distress.  HENT:  Head: Normocephalic and atraumatic.  Right Ear: Tympanic membrane, external ear and ear canal normal.  Left Ear: Tympanic membrane,  external ear and ear canal normal.  Nose: Nose normal. No mucosal edema or rhinorrhea.  Mouth/Throat: Uvula is midline, oropharynx is clear and moist and mucous membranes are normal. She does not have dentures. No oral lesions. Abnormal dentition. No dental abscesses, uvula swelling, lacerations or dental caries. No oropharyngeal exudate, posterior oropharyngeal edema, posterior oropharyngeal erythema or tonsillar abscesses.  +TTP LEFT TMJ region; popping of TMJ joint with opening and closing mouth  Eyes: Conjunctivae are normal. Pupils are equal, round, and reactive to light.  Neck: Normal range of motion. Neck supple. No thyromegaly present.  Cardiovascular: Normal rate, regular rhythm and normal heart sounds. Exam reveals no gallop and no friction rub.  No murmur heard. Pulmonary/Chest: Effort normal and breath sounds normal. She has no wheezes. She has no rales.  Lymphadenopathy:    She has no cervical adenopathy.  Neurological: She is alert and oriented to person, place, and time.  Skin: She is not diaphoretic.  Psychiatric: She has a normal mood and affect. Her behavior is normal.  Nursing note and vitals reviewed.  No results found. Depression screen [facility] 2/9 03/18/2017 02/11/2017 09/04/2016 12/26/2015 09/19/2015  Decreased Interest 0 0 1 0 1  Down, Depressed, Hopeless 0 0 1 0 1  PHQ - 2 Score 0 0 2 0 2  Altered sleeping - - - - 1  Tired, decreased energy - - - - 1  Change in appetite - - - - 1  Feeling bad or failure about yourself  - - - - 1  Trouble concentrating - - - - 1  Moving slowly or fidgety/restless - - - - 1  Suicidal thoughts - - - - 0  PHQ-9 Score - - - - 8  Difficult doing work/chores - - - - Extremely dIfficult  Some recent data might be hidden   Fall Risk  03/25/2017 03/18/2017 02/11/2017 09/04/2016 05/29/2016  Falls in the past year? No No No  No No        Assessment & Plan:   1. Sprain of temporomandibular joint, initial encounter   2. Dental disease   3.  Panlobular emphysema ([place])   4. Family history of alpha 1 antitrypsin deficiency   5. Tobacco abuse   6. Pulmonary nodules    Recurrent TMJ sprain due to excessive nicotine gum chewing.  Agreeable to trial of prednisone 20 mg 2 daily for 5 days and then 1 daily for 5 days.  Patient is suffering with dental disease at this time however no evidence of dental abscess.  Prescription for clindamycin provided to treat any underlying contributing dental infection.  Congratulations on attempting smoking cessation.  No orders of the defined types were placed in this encounter.  Meds ordered this encounter  Medications  . predniSONE (DELTASONE) 20 MG tablet    Sig: Two tablets daily x 5 days then one tablet daily x 5 days    Dispense:  15 tablet    Refill:  0  . clindamycin (CLEOCIN) 150 MG capsule    Sig: Take 1 capsule (150 mg total) by mouth 3 (three) times daily.    Dispense:  21 capsule    Refill:  0    No Follow-up on file.   Kadance Mccuistion Elayne Guerin, M.D. Primary Care at [facility] [address] [place], [place]  [address] [phone] phone [phone] fax

## 2017-04-10 ENCOUNTER — Other Ambulatory Visit: Payer: Self-pay

## 2017-04-10 ENCOUNTER — Encounter (HOSPITAL_COMMUNITY): Payer: Self-pay | Admitting: Emergency Medicine

## 2017-04-10 DIAGNOSIS — G501 Atypical facial pain: Secondary | ICD-10-CM | POA: Diagnosis not present

## 2017-04-10 DIAGNOSIS — Z79899 Other long term (current) drug therapy: Secondary | ICD-10-CM | POA: Insufficient documentation

## 2017-04-10 DIAGNOSIS — F17210 Nicotine dependence, cigarettes, uncomplicated: Secondary | ICD-10-CM | POA: Diagnosis not present

## 2017-04-10 DIAGNOSIS — J45909 Unspecified asthma, uncomplicated: Secondary | ICD-10-CM | POA: Insufficient documentation

## 2017-04-10 DIAGNOSIS — J449 Chronic obstructive pulmonary disease, unspecified: Secondary | ICD-10-CM | POA: Insufficient documentation

## 2017-04-10 DIAGNOSIS — R600 Localized edema: Secondary | ICD-10-CM | POA: Diagnosis present

## 2017-04-10 NOTE — ED Triage Notes (Signed)
Pt reports left sided facial pain/swelling since the middle of December.  She has seen treatment at her PCP and her dentist and completed a round of steroids.  It started when she began to quit smoking and was "aggressively chewing the gum."  Pt has had prior facial surgery as a result of "childhood abnormality."  Pt is rather anxious about the swelling.

## 2017-04-11 ENCOUNTER — Emergency Department (HOSPITAL_COMMUNITY)
Admission: EM | Admit: 2017-04-11 | Discharge: 2017-04-11 | Disposition: A | Discharge status: Home / Self Care | Payer: Medicare Other | Attending: Emergency Medicine | Admitting: Emergency Medicine

## 2017-04-11 ENCOUNTER — Emergency Department (HOSPITAL_COMMUNITY): Payer: Medicare Other

## 2017-04-11 DIAGNOSIS — R51 Headache: Secondary | ICD-10-CM

## 2017-04-11 DIAGNOSIS — G501 Atypical facial pain: Secondary | ICD-10-CM | POA: Diagnosis not present

## 2017-04-11 DIAGNOSIS — R519 Headache, unspecified: Secondary | ICD-10-CM
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
## 2017-04-11 NOTE — ED Notes (Signed)
PT states understanding of care given, follow up care, and medication prescribed. PT ambulated from ED to car with a steady gait. 

## 2017-04-11 NOTE — Discharge Instructions (Signed)
Your CT today did not show any acute abnormalities.   You can follow-up with your dentist and/or your primary care doctor.

## 2017-04-11 NOTE — ED Notes (Signed)
Patient is A&Ox4.  No signs of distress noted.  Please see providers complete history and physical exam.  

## 2017-04-11 NOTE — ED Provider Notes (Signed)
Assumed care from PA Givens at shift change.  See prior notes for full H&P.  Briefly, 47 year old female here with left-sided facial swelling.  Has been intermittent over the past 2 weeks.  Has been seen by her dentist as well as primary care doctor for this mom numerous times.  Has recently been on steroids as well as antibiotics for possible infection but has not had any change.  States she was told she needed imaging of her face.  Plan.  CT of the face pending.  Likely will be negative and can be discharged.  Results for orders placed or performed in visit on 02/19/17  Pulmonary function test  Result Value Ref Range   FVC-Pre 4.06 L   FVC-%Pred-Pre 100 %   FVC-Post 4.21 L   FVC-%Pred-Post 104 %   FVC-%Change-Post 3 %   FEV1-Pre 2.57 L   FEV1-%Pred-Pre 79 %   FEV1-Post 2.88 L   FEV1-%Pred-Post 89 %   FEV1-%Change-Post 12 %   FEV6-Pre 3.99 L   FEV6-%Pred-Pre 101 %   FEV6-Post 4.15 L   FEV6-%Pred-Post 105 %   FEV6-%Change-Post 4 %   Pre FEV1/FVC ratio 63 %   FEV1FVC-%Pred-Pre 78 %   Post FEV1/FVC ratio 68 %   FEV1FVC-%Change-Post 8 %   Pre FEV6/FVC Ratio 98 %   FEV6FVC-%Pred-Pre 100 %   Post FEV6/FVC ratio 99 %   FEV6FVC-%Pred-Post 100 %   FEV6FVC-%Change-Post 0 %   FEF 25-75 Pre 1.35 L/sec   FEF2575-%Pred-Pre 43 %   FEF 25-75 Post 1.95 L/sec   FEF2575-%Pred-Post 62 %   FEF2575-%Change-Post 44 %   RV 2.59 L   RV % pred 139 %   TLC 6.56 L   TLC % pred 118 %   DLCO unc 20.65 ml/min/mmHg   DLCO unc % pred 72 %   DLCO cor 19.95 ml/min/mmHg   DLCO cor % pred 69 %   DL/VA 3.34 ml/min/mmHg/L   DL/VA % pred 64 %   Ct Maxillofacial Wo Contrast  Result Date: 04/11/2017 CLINICAL DATA:  Left-sided facial pain and swelling since mid December. Pain started when she began chewing gum when she quit smoking. EXAM: CT MAXILLOFACIAL WITHOUT CONTRAST TECHNIQUE: Multidetector CT imaging of the maxillofacial structures was performed. Multiplanar CT image reconstructions were also generated.  COMPARISON:  Mandible radiographs 02/11/2017.  CT neck 05/04/2016. FINDINGS: Osseous: No acute fracture or dislocation demonstrated in the facial bones, nasal bones, orbital bones, or mandibles. There is excessive productive bone along the anterior maxillary walls and base of the zygomatic arches bilaterally. This is unchanged since the previous study and likely represents a chronic process. No discrete bone mass or bone destruction. Orbits: Globes and extraocular muscles are intact and symmetrical. Sinuses: Small retention cyst in the left maxillary antrum. No acute air-fluid levels. Mastoid air cells are clear. Soft tissues: No soft tissue mass or infiltration identified. Visualized salivary glands and lymph nodes are not enlarged. Limited intracranial: No acute process identified. IMPRESSION: No acute process identified that would account for the history of left-sided facial pain and swelling. Symmetrical productive bone demonstrated along the anterior maxillary antral walls bilaterally. This is a chronic finding and likely of no clinical significance. No focal mass or infiltrative process identified. Electronically Signed   By: Lucienne Capers M.D.   On: 04/11/2017 01:20    CT face without any acute abnormalities.  She does not appear to have any significant swelling on my examination.  She is not having any trouble swallowing, drinking  fluids without issue here in the ED.  I discussed her results with her, she states she is not comfortable with our radiologist read and is requesting second opinion from different radiologist.  I discussed with her that I do not feel this is indicated at this time, states she will be going to another facility for second imaging.  I have given her copies of her imaging report and imaging burned onto disc for dentist review.  Patient discharged home in stable condition.   Larene Pickett, PA-C 04/11/17 0235    Orpah Greek, MD 04/11/17 [phone]

## 2017-04-17 ENCOUNTER — Encounter: Payer: Self-pay | Admitting: Family Medicine

## 2017-04-18 ENCOUNTER — Encounter: Payer: Self-pay | Admitting: Family Medicine

## 2017-04-18 ENCOUNTER — Other Ambulatory Visit: Payer: Self-pay

## 2017-04-18 ENCOUNTER — Ambulatory Visit (INDEPENDENT_AMBULATORY_CARE_PROVIDER_SITE_OTHER): Payer: Medicare Other | Admitting: Family Medicine

## 2017-04-18 VITALS — BP 120/72 | HR 77 | Temp 97.9°F | Resp 18

## 2017-04-18 DIAGNOSIS — R220 Localized swelling, mass and lump, head: Secondary | ICD-10-CM | POA: Diagnosis not present

## 2017-04-18 DIAGNOSIS — M26629 Arthralgia of temporomandibular joint, unspecified side: Secondary | ICD-10-CM

## 2017-04-18 DIAGNOSIS — Z9689 Presence of other specified functional implants: Secondary | ICD-10-CM

## 2017-04-18 MED ORDER — HYDROCODONE-ACETAMINOPHEN 5-325 MG PO TABS: 1.0000 | ORAL_TABLET | Freq: Four times a day (QID) | ORAL | 0 refills | Status: DC | PRN

## 2017-04-18 NOTE — Progress Notes (Signed)
Subjective:    Patient ID: Catherine Olsen, female    DOB: June 06, 1970, 48 y.o.   MRN: [id number]  04/18/2017  Facial Swelling (f/u)    HPI This 47 y.o. female presents for follow-up in ED follow-up evaluation of facial swelling and TMJ pain. Patient seen and evaluated on 03/25/2017 by myself.  Patient requested prednisone therapy for treatment of acute TMJ syndrome due to excessive nicotine gum chewing and attempts of tobacco cessation.  At that visit, also having dental issues and active dental pain along gumline.  Due to treatment with prednisone, empirically treated with clindamycin therapy at that visit.  Patient followed up with dentist after visit on March 25, 2017 with myself and dentist did not identify any dental infection.  Recommended discontinuing clindamycin therapy.  Patient then presented to the emergency department on April 12, 2017 due to concern of unknown etiology of TMJ pain.  Emergency room department physician ordered a CT maxillofacial with the following results: IMPRESSION of CT maxillofacial without contrast on 04/11/17: No acute process identified that would account for the history of left-sided facial pain and swelling. Symmetrical productive bone demonstrated along the anterior maxillary antral walls bilaterally. This is a chronic finding and likely of no clinical significance. No focal mass or infiltrative process identified. Patient expresses extreme frustration and concern With being prescribed prednisone therapy and antibiotic therapy in the past 3 years for presumed TMJ.  Patient has now convinced herself that she is suffering with a slippage of a maxillary prosthesis that was placed during her childhood.  Patient feels it is now obvious of the pathology that is occurring.  She is not been diagnosed with a slippage of the prosthesis in her maxillary regions; however, she realizes this must be the etiology of her current pain and swelling on the left side of her  face.  She is requesting a referral to a maxillofacial surgeon and is asking for recommendation. 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  Then placed implant on LEFT side to make symmetric. Once facial swelling improved with Prednisone and Clindamycin therapies, patient is appreciating slippage of implant on RIGHT.  Requesting a referral to maxillofacial surgeon.  Wants to know the next steps.  Continues to have significant pain in the left TMJ region.  Must eat mashed potatoes to prevent increased swelling; still in pain.  Feels pain in L sinus region; pain in R cheek.  Muscle keeps pushing.  Denies fever, chills, sweats.  Denies pain with extraocular movements.  Denies eye pain.  Denies eye redness or drainage.  Denies rhinorrhea, nasal congestion, cough.  Denies dental pain with chewing.  Denies facial rash.  Denies abnormality of hearing.  Denies ringing in ears.  Diona Browner, MD REMOVED WISDOM TEETH.   BP Readings from Last 3 Encounters:  04/18/17 120/72  04/11/17 132/78  03/25/17 102/70   Wt Readings from Last 3 Encounters:  04/10/17 138 lb (62.6 kg)  03/25/17 138 lb (62.6 kg)  03/18/17 138 lb (62.6 kg)   There is no immunization history for the selected administration types on file for this patient.  Review of Systems  Constitutional: Negative for chills, diaphoresis, fatigue and fever.  HENT: Positive for facial swelling and sinus pain. Negative for congestion, dental problem, drooling, ear discharge, ear pain, hearing loss, mouth sores, nosebleeds, postnasal drip, rhinorrhea, sinus pressure, sneezing, sore throat, tinnitus, trouble swallowing and voice change.   Eyes: Negative for visual disturbance.  Respiratory: Negative for cough  and shortness of breath.   Cardiovascular: Negative for chest pain, palpitations and leg swelling.  Gastrointestinal: Negative for abdominal pain, constipation, diarrhea, nausea and vomiting.  Endocrine:  Negative for cold intolerance, heat intolerance, polydipsia, polyphagia and polyuria.  Neurological: Negative for dizziness, tremors, seizures, syncope, facial asymmetry, speech difficulty, weakness, light-headedness, numbness and headaches.    Past Medical History:  Diagnosis Date  . Allergy   . Anxiety   . Arthritis    hands, lower back, knees  . Asthma   . Bipolar 1 disorder ([place])   . COPD (chronic obstructive pulmonary disease) ([place])   . Depression   . Endometriosis   . Fibromyalgia   . Headache(784.0)    otc med prn  . Migraine   . Pituitary tumor   . PONV (postoperative nausea and vomiting)   . PTSD (post-traumatic stress disorder)   . Scoliosis   . Termination of pregnancy    x 2 at age 21 and 47 yrs old   Past Surgical History:  Procedure Laterality Date  . BREAST SURGERY    . DILATION AND CURETTAGE OF UTERUS    . FACIAL COSMETIC SURGERY     right cheek  . LAPAROSCOPY Right 11/11/2013   Procedure: LAPAROSCOPY OPERATIVE with  Drainage of  RIGHT Ovarian ENDOMETRIOMA;  Surgeon: Elveria Royals, MD;  Location: [facility];  Service: Gynecology;  Laterality: Right;  . NOSE SURGERY     rhinoplasty at age 63 yrs  . WISDOM TOOTH EXTRACTION     Allergies  Allergen Reactions  . Sulfamethoxazole Anaphylaxis  . Oxycodone Other (See Comments)    Severe blindness?  . Abilify [Aripiprazole]     Generic Abilify--causes severe neurological problems. Can use name brand  . Penicillins Hives    Has patient had a PCN reaction causing immediate rash, facial/tongue/throat swelling, SOB or lightheadedness with hypotension: no Has patient had a PCN reaction causing severe rash involving mucus membranes or skin necrosis: NO Has patient had a PCN reaction that required hospitalizationNO Has patient had a PCN reaction occurring within the last 10 years:NO If all of the above answers are "NO", then may proceed with Cephalosporin use.   . Other Anxiety    steroids  . Percocet  [Oxycodone-Acetaminophen] Palpitations  . Sulfa Antibiotics Hives  . Sulfites Other (See Comments)    unknown   Current Outpatient Medications on File Prior to Visit  Medication Sig Dispense Refill  . albuterol (VENTOLIN HFA) 108 (90 Base) MCG/ACT inhaler INHALE 2 PUFFS INTO THE LUNGS EVERY 6 HOURS AS NEEDED FOR WHEEZING OR SHORTNESS OF BREATH 18 g 2  . alprazolam (XANAX) 2 MG tablet TK 1 T PO BID PRF SEVERE ANXIETY/AGITATION  1  . celecoxib (CELEBREX) 200 MG capsule Take 200 mg by mouth daily.    Marland Kitchen estradiol (CLIMARA - DOSED IN MG/24 HR) 0.025 mg/24hr patch APP 1 PA EXT TO THE SKIN Q 3 DAYS  12  . ibuprofen (ADVIL,MOTRIN) 800 MG tablet Take 1 tablet (800 mg total) by mouth every 8 (eight) hours as needed. 60 tablet 1  . progesterone (PROMETRIUM) 200 MG capsule TK ONE C PO  ONCE A DAY X 10 DAYS  3  . temazepam (RESTORIL) 30 MG capsule Take 30 mg by mouth at bedtime as needed for sleep.    . [CANCELED] beclomethasone (QVAR) 40 MCG/ACT inhaler Use two puffs twice daily for an asthma flare.  Rinse, gargle and spit after use. 1 Inhaler 2  . clindamycin (CLEOCIN) 150 MG capsule  Take 1 capsule (150 mg total) by mouth 3 (three) times daily. (Patient not taking: Reported on 04/18/2017) 21 capsule 0  . predniSONE (DELTASONE) 20 MG tablet Two tablets daily x 5 days then one tablet daily x 5 days (Patient not taking: Reported on 04/18/2017) 15 tablet 0   No current facility-administered medications on file prior to visit.    Social History   Socioeconomic History  . Marital status: Divorced    Spouse name: Not on file  . Number of children: 0  . Years of education: Not on file  . Highest education level: Not on file  Social Needs  . Financial resource strain: Not on file  . Food insecurity - worry: Not on file  . Food insecurity - inability: Not on file  . Transportation needs - medical: Not on file  . Transportation needs - non-medical: Not on file  Occupational History  . Occupation:  disability    Comment: mental illness  Tobacco Use  . Smoking status: Current Every Day Smoker    Packs/day: 0.50    Years: 29.00    Pack years: 14.50    Types: Cigarettes, E-cigarettes  . Smokeless tobacco: Never Used  Substance and Sexual Activity  . Alcohol use: Yes    Comment: "  . Drug use: Yes    Types: "Crack" cocaine, Cocaine    Comment: Heavy user per patient, last use Tuesday 11/08/13  LAST CRACK  2015  . Sexual activity: Not Currently    Comment: abortion at 41 and 8yrs. of age   Other Topics Concern  . Not on file  Social History Narrative   Marital status: divorced; not dating      Children: none      Lives: alone with mom      Employment: disability for mental illness in 1997.  Hospitalizations x 9 in past.      Tobacco: 1 ppd x since age 42. Decreasing in 2018.      Alcohol: socially; beers.      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      Exercise: yoga daily; exercise daily.      ADLs: independent with ADLs; no car; depends on others for transportation.   Family History  Problem Relation Age of Onset  . Diabetes Mother   . Hypertension Mother   . Stroke Mother 48       CVA  . Mental illness Mother        no diagnosis; personality disorder  . Hyperlipidemia Father   . Hypertension Father   . COPD Father   . Alpha-1 antitrypsin deficiency Father   . Alpha-1 antitrypsin deficiency Brother   . Diabetes Maternal Grandmother   . Heart disease Maternal Grandmother   . Hyperlipidemia Maternal Grandmother   . Hypertension Maternal Grandmother   . Mental illness Maternal Grandmother   . Heart disease Maternal Grandfather   . Hyperlipidemia Maternal Grandfather   . Hypertension Maternal Grandfather   . Heart disease Paternal Grandfather   . Hyperlipidemia Paternal Grandfather   . Hypertension Paternal Grandfather   . Stroke Paternal Grandfather        Objective:    BP 120/72 (BP Location: Right Arm, Patient Position: Sitting, Cuff  Size: Normal)   Pulse 77   Temp 97.9 F (36.6 C) (Oral)   Resp 18   LMP 11/21/2014 Comment: Pt. states she is having bleeding today and has been for 9 days.  SpO2 100%  Physical  Exam  Constitutional: She is oriented to person, place, and time. She appears well-developed and well-nourished. No distress.  HENT:  Head: Normocephalic and atraumatic. Head is without raccoon's eyes, without Battle's sign, without abrasion, without contusion and without laceration. Hair is normal.    Right Ear: External ear normal.  Left Ear: External ear normal.  Nose: Nose normal.  Mouth/Throat: Oropharynx is clear and moist.  Very mild localized swelling inferior orbit region on the left at maxillary ridge.  No associated erythema or fluctuance.  Eyes: Conjunctivae and EOM are normal. Pupils are equal, round, and reactive to light.  Neck: Normal range of motion. Neck supple. Carotid bruit is not present. No thyromegaly present.  Cardiovascular: Normal rate, regular rhythm, normal heart sounds and intact distal pulses. Exam reveals no gallop and no friction rub.  No murmur heard. Pulmonary/Chest: Effort normal and breath sounds normal. She has no wheezes. She has no rales.  Lymphadenopathy:    She has no cervical adenopathy.  Neurological: She is alert and oriented to person, place, and time. No cranial nerve deficit.  Skin: Skin is warm and dry. No rash noted. She is not diaphoretic. No erythema. No pallor.  Psychiatric: Her speech is normal. Judgment and thought content normal. Her affect is angry. She is agitated. Cognition and memory are normal.   No results found. Depression screen [facility] 2/9 04/18/2017 03/18/2017 02/11/2017 09/04/2016 12/26/2015  Decreased Interest - 0 0 1 0  Down, Depressed, Hopeless 3 0 0 1 0  PHQ - 2 Score 3 0 0 2 0  Altered sleeping - - - - -  Tired, decreased energy - - - - -  Change in appetite - - - - -  Feeling bad or failure about yourself  - - - - -  Trouble concentrating -  - - - -  Moving slowly or fidgety/restless - - - - -  Suicidal thoughts - - - - -  PHQ-9 Score - - - - -  Difficult doing work/chores - - - - -  Some recent data might be hidden   Fall Risk  04/18/2017 03/25/2017 03/18/2017 02/11/2017 09/04/2016  Falls in the past year? No No No No No        Assessment & Plan:   1. TMJ pain dysfunction syndrome   2. Jaw swelling   3. Maxillofacial prosthesis present    Ongoing left-sided facial pain associated with mild left maxillary swelling.  History of maxillofacial prostheses bilaterally and maxillary regions.  Swelling improved with treatment with prednisone and clindamycin.  Status post dental evaluation and no primary dental pathology occurring.  Status post CT of the maxillofacial region which was benign without mass/tumor or acute infectious process.  Patient currently concerned that prostheses of bilateral maxillary regions have slipped.  Patient concerned that this possible slippage is the etiology to her current pain and swelling.  Patient is very distressed of unknown etiology to symptoms.  Reassurance provided to patient that cancer and severe infection have been ruled out.  I also encourage patient to undergo ENT consultation to evaluate for other pathology.  If ENT evaluation negative, then recommend proceeding with maxillofacial consultation.  Patient requesting pain medication for pain.  Agreeable to 15 hydrocodone.  Will not refill opiate.  Patient has remained obtained on 2 benzodiazepines on high doses; thus, opiates contraindicated for regular use.  Orders Placed This Encounter  Procedures  . Ambulatory referral to Oral Maxillofacial Surgery    Referral Priority:   Routine  Referral Type:   Surgical    Referral Reason:   Specialty Services Required    Requested Specialty:   Oral Surgery    Number of Visits Requested:   1  . Ambulatory referral to ENT    Referral Priority:   Routine    Referral Type:   Consultation    Referral  Reason:   Specialty Services Required    Requested Specialty:   Otolaryngology    Number of Visits Requested:   1   Meds ordered this encounter  Medications  . HYDROcodone-acetaminophen (NORCO) 5-325 MG tablet    Sig: Take 1 tablet by mouth every 6 (six) hours as needed.    Dispense:  15 tablet    Refill:  0    No Follow-up on file.   Claressa Hughley Elayne Guerin, M.D. Primary Care at [facility] [address] [place], [place]  [address] [phone] phone [phone] fax

## 2017-04-18 NOTE — Patient Instructions (Addendum)
  Please call your ENT to evaluate you for persistent L facial pain.     IF you received an x-ray today, you will receive an invoice from [facility]. Please contact [facility] at [phone] with questions or concerns regarding your invoice.   IF you received labwork today, you will receive an invoice from [facility]. Please contact [facility] at [phone] with questions or concerns regarding your invoice.   Our billing staff will not be able to assist you with questions regarding bills from these companies.  You will be contacted with the lab results as soon as they are available. The fastest way to get your results is to activate your My Chart account. Instructions are located on the last page of this paperwork. If you have not heard from [place] regarding the results in 2 weeks, please contact this office.

## 2017-04-28 DIAGNOSIS — Z9689 Presence of other specified functional implants: Secondary | ICD-10-CM | POA: Insufficient documentation

## 2017-05-28 ENCOUNTER — Other Ambulatory Visit: Payer: Self-pay

## 2017-05-28 ENCOUNTER — Ambulatory Visit (HOSPITAL_COMMUNITY)
Admission: EM | Admit: 2017-05-28 | Discharge: 2017-05-28 | Disposition: A | Discharge status: Home / Self Care | Payer: Medicare Other | Attending: Family Medicine | Admitting: Family Medicine

## 2017-05-28 ENCOUNTER — Encounter (HOSPITAL_COMMUNITY): Payer: Self-pay | Admitting: Emergency Medicine

## 2017-05-28 DIAGNOSIS — R220 Localized swelling, mass and lump, head: Secondary | ICD-10-CM | POA: Diagnosis not present

## 2017-05-28 MED ORDER — PREDNISONE 10 MG (48) PO TBPK: ORAL_TABLET | ORAL | 0 refills | Status: DC

## 2017-05-28 NOTE — ED Triage Notes (Addendum)
Facial swelling started in December.  Saw pcp and was given medications for tmj/dental infection.  Went to dentist. Dentist is doing a lot of dental work.   Patient does also have facial implants and has seen surgeon and has been told implants are stable.  Swelling seemed to be subsiding for a week and 2 days ago, swelling increased.  Patient did drink some tea and felt facial swelling and throat swelling and took benadryl.  Benadryl helped that swelling, but the swelling that has been ongoing is not going away  Unable to bite down on left side due to swelling per patient  dont touch her face or it swells on contact per patient.

## 2017-06-01 ENCOUNTER — Encounter (HOSPITAL_COMMUNITY): Payer: Self-pay | Admitting: Emergency Medicine

## 2017-06-01 ENCOUNTER — Ambulatory Visit (HOSPITAL_COMMUNITY)
Admission: EM | Admit: 2017-06-01 | Discharge: 2017-06-01 | Disposition: A | Discharge status: Home / Self Care | Payer: Medicare Other | Attending: Family Medicine | Admitting: Family Medicine

## 2017-06-01 DIAGNOSIS — T886XXA Anaphylactic reaction due to adverse effect of correct drug or medicament properly administered, initial encounter: Secondary | ICD-10-CM

## 2017-06-01 DIAGNOSIS — R220 Localized swelling, mass and lump, head: Secondary | ICD-10-CM | POA: Diagnosis not present

## 2017-06-01 MED ORDER — HYDROXYZINE HCL 25 MG PO TABS: 25.0000 mg | ORAL_TABLET | Freq: Four times a day (QID) | ORAL | 0 refills | Status: DC

## 2017-06-01 NOTE — ED Provider Notes (Signed)
[place]   [id number] 06/01/17 Arrival Time: 5621  ASSESSMENT & PLAN:  1. Facial swelling   2. Anaphylactic reaction due to adverse effect of correct drug or medicament properly administered, initial encounter - question of   Meds ordered this encounter  Medications  . hydrOXYzine (ATARAX/VISTARIL) 25 MG tablet    Sig: Take 1 tablet (25 mg total) by mouth every 6 (six) hours.    Dispense:  20 tablet    Refill:  0   I discussed the very low likelihood that the prednisone she was taking might be the cause of her facial swelling. She has taken the same brand in the recent past according to her pharmacist. That being said, I cannot rule this out. She will stop prednisone. Anti-histamines seem to be providing some relief. Requests Vistaril; she has used in the past.  She reports that she has an appointment with a new PCP in March. To keep scheduled appt. May f/u here as needed.  Reviewed expectations re: course of current medical issues. Questions answered. Outlined signs and symptoms indicating need for more acute intervention. Patient verbalized understanding. After Visit Summary given.   SUBJECTIVE:  FAIRY ASHLOCK is a 47 y.o. female who questions whether she has had a reaction to the prednisone recently prescribed by me for facial swelling of unknown etiology. Reports that she felt prednisone was helping "but then I noticed my forehead and cheeks swelling up." No pain. Reports this has happened with each dose of prednisone. Benadryl OTC 50mg  "calmed it down." Reports last dose of prednisone was about 18 hours ago. She has taken another 50mg  dose of Benadryl this morning before coming here. No swallowing or breathing difficulties. Afebrile. No n/v.  ROS: As per HPI.  OBJECTIVE:  Vitals:   06/01/17 1019 06/01/17 1023  BP: 124/90   Pulse: 84   Resp: 20   SpO2: 98%   Weight:  135 lb (61.2 kg)    General appearance: alert; no distress HENT: normocephalic; atraumatic;  no signs of acute facial swelling (no differences noticed from her last visit here) Neck: supple without LAD Lungs: normal respirations Skin: warm and dry without rashes or erythema Psychological: alert and cooperative; normal mood and affect  Allergies  Allergen Reactions  . Sulfamethoxazole Anaphylaxis  . Oxycodone Other (See Comments)    Severe blindness?  . Abilify [Aripiprazole]     Generic Abilify--causes severe neurological problems. Can use name brand  . Penicillins Hives    Has patient had a PCN reaction causing immediate rash, facial/tongue/throat swelling, SOB or lightheadedness with hypotension: no Has patient had a PCN reaction causing severe rash involving mucus membranes or skin necrosis: NO Has patient had a PCN reaction that required hospitalizationNO Has patient had a PCN reaction occurring within the last 10 years:NO If all of the above answers are "NO", then may proceed with Cephalosporin use.   . Other Anxiety    steroids  . Percocet [Oxycodone-Acetaminophen] Palpitations  . Sulfa Antibiotics Hives  . Sulfites Other (See Comments)    unknown    Past Medical History:  Diagnosis Date  . Allergy   . Anxiety   . Arthritis    hands, lower back, knees  . Asthma   . Bipolar 1 disorder ([place])   . COPD (chronic obstructive pulmonary disease) ([place])   . Depression   . Endometriosis   . Fibromyalgia   . Headache(784.0)    otc med prn  . Migraine   . Pituitary tumor   .  PONV (postoperative nausea and vomiting)   . PTSD (post-traumatic stress disorder)   . Scoliosis   . Termination of pregnancy    x 2 at age 68 and 47 yrs old   Social History   Socioeconomic History  . Marital status: Divorced    Spouse name: Not on file  . Number of children: 0  . Years of education: Not on file  . Highest education level: Not on file  Social Needs  . Financial resource strain: Not on file  . Food insecurity - worry: Not on file  . Food insecurity - inability: Not  on file  . Transportation needs - medical: Not on file  . Transportation needs - non-medical: Not on file  Occupational History  . Occupation: disability    Comment: mental illness  Tobacco Use  . Smoking status: Current Every Day Smoker    Packs/day: 0.50    Years: 29.00    Pack years: 14.50    Types: Cigarettes, E-cigarettes  . Smokeless tobacco: Never Used  Substance and Sexual Activity  . Alcohol use: Yes    Comment: "  . Drug use: Yes    Types: "Crack" cocaine, Cocaine    Comment: Heavy user per patient, last use Tuesday 11/08/13  LAST CRACK  2015  . Sexual activity: Not Currently    Comment: abortion at 89 and 85yrs. of age   Other Topics Concern  . Not on file  Social History Narrative   Marital status: divorced; not dating      Children: none      Lives: alone with mom      Employment: disability for mental illness in 1997.  Hospitalizations x 9 in past.      Tobacco: 1 ppd x since age 27. Decreasing in 2018.      Alcohol: socially; beers.      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      Exercise: yoga daily; exercise daily.      ADLs: independent with ADLs; no car; depends on others for transportation.   Family History  Problem Relation Age of Onset  . Diabetes Mother   . Hypertension Mother   . Stroke Mother 37       CVA  . Mental illness Mother        no diagnosis; personality disorder  . Hyperlipidemia Father   . Hypertension Father   . COPD Father   . Alpha-1 antitrypsin deficiency Father   . Alpha-1 antitrypsin deficiency Brother   . Diabetes Maternal Grandmother   . Heart disease Maternal Grandmother   . Hyperlipidemia Maternal Grandmother   . Hypertension Maternal Grandmother   . Mental illness Maternal Grandmother   . Heart disease Maternal Grandfather   . Hyperlipidemia Maternal Grandfather   . Hypertension Maternal Grandfather   . Heart disease Paternal Grandfather   . Hyperlipidemia Paternal Grandfather   . Hypertension  Paternal Grandfather   . Stroke Paternal Grandfather    Past Surgical History:  Procedure Laterality Date  . BREAST SURGERY    . DILATION AND CURETTAGE OF UTERUS    . FACIAL COSMETIC SURGERY     right cheek  . LAPAROSCOPY Right 11/11/2013   Procedure: LAPAROSCOPY OPERATIVE with  Drainage of  RIGHT Ovarian ENDOMETRIOMA;  Surgeon: Elveria Royals, MD;  Location: [facility];  Service: Gynecology;  Laterality: Right;  . NOSE SURGERY     rhinoplasty at age 86 yrs  . WISDOM TOOTH EXTRACTION  Vanessa Kick, MD 06/01/17 1050

## 2017-06-01 NOTE — ED Triage Notes (Signed)
PT has been on 4 days prednisone at 60 mg per dose. She reports she has had multiple instances of facial swelling. PT reports swelling was significant this AM. She had 3 benadyl tablets this AM.

## 2017-06-02 NOTE — ED Provider Notes (Signed)
Catherine Olsen   [id number] 05/28/17 Arrival Time: [place]:  1. Facial swelling   Unsure of exact etiology. She has had a fairly extensive workup including recent CT. Trial of: Meds ordered this encounter  Medications  . predniSONE (STERAPRED UNI-PAK 48 TAB) 10 MG (48) TBPK tablet    Sig: Take as directed.    Dispense:  48 tablet    Refill:  0   Recommend that she f/u with her surgeon. May f/u here as needed but I am not sure we have anything else to offer her. Discussed. She may benefit from rheumatology evaluation.  Reviewed expectations re: course of current medical issues. Questions answered. Outlined signs and symptoms indicating need for more acute intervention. Patient verbalized understanding. After Visit Summary given.   SUBJECTIVE: History from: patient. Catherine Olsen is a 47 y.o. female who presents with complaint of intermittent "facial swelling" for the past 2-3 months. Has been treated for possible dental infection. Has seen a dentist. Has seen her surgeon who placed facial implants many years ago. "No one can tell me why my face swells." No swallowing or respiratory difficulties. Benadryl helps some. No rashes with swelling.  ROS: As per HPI.   OBJECTIVE:  Vitals:   05/28/17 1907 05/28/17 1908  BP: 133/88   Pulse: 88   Resp: 16   Temp: (!) 96.7 F (35.9 C)   SpO2: 97%   Weight:  135 lb (61.2 kg)  Height:  5' 7.5" (1.715 m)    General appearance: alert; no distress Eyes: PERRLA; EOMI; conjunctiva normal HENT: normocephalic; atraumatic; "don't touch my face or it will swell" Neck: supple  Lungs: clear to auscultation bilaterally Heart: regular rate and rhythm Extremities: no cyanosis or edema; symmetrical with no gross deformities Skin: warm and dry Neurologic: normal gait; normal symmetric reflexes Psychological: alert and cooperative; slightly bizarre affect   Allergies  Allergen Reactions  . Sulfamethoxazole Anaphylaxis   . Oxycodone Other (See Comments)    Severe blindness?  . Abilify [Aripiprazole]     Generic Abilify--causes severe neurological problems. Can use name brand  . Penicillins Hives    Has patient had a PCN reaction causing immediate rash, facial/tongue/throat swelling, SOB or lightheadedness with hypotension: no Has patient had a PCN reaction causing severe rash involving mucus membranes or skin necrosis: NO Has patient had a PCN reaction that required hospitalizationNO Has patient had a PCN reaction occurring within the last 10 years:NO If all of the above answers are "NO", then may proceed with Cephalosporin use.   . Other Anxiety    steroids  . Percocet [Oxycodone-Acetaminophen] Palpitations  . Sulfa Antibiotics Hives  . Sulfites Other (See Comments)    unknown    Past Medical History:  Diagnosis Date  . Allergy   . Anxiety   . Arthritis    hands, lower back, knees  . Asthma   . Bipolar 1 disorder ([place])   . COPD (chronic obstructive pulmonary disease) ([place])   . Depression   . Endometriosis   . Fibromyalgia   . Headache(784.0)    otc med prn  . Migraine   . Pituitary tumor   . PONV (postoperative nausea and vomiting)   . PTSD (post-traumatic stress disorder)   . Scoliosis   . Termination of pregnancy    x 2 at age 65 and 47 yrs old   Social History   Socioeconomic History  . Marital status: Divorced    Spouse name: Not on file  .  Number of children: 0  . Years of education: Not on file  . Highest education level: Not on file  Social Needs  . Financial resource strain: Not on file  . Food insecurity - worry: Not on file  . Food insecurity - inability: Not on file  . Transportation needs - medical: Not on file  . Transportation needs - non-medical: Not on file  Occupational History  . Occupation: disability    Comment: mental illness  Tobacco Use  . Smoking status: Current Every Day Smoker    Packs/day: 0.50    Years: 29.00    Pack years: 14.50    Types:  Cigarettes, E-cigarettes  . Smokeless tobacco: Never Used  Substance and Sexual Activity  . Alcohol use: Yes    Comment: "  . Drug use: Yes    Types: "Crack" cocaine, Cocaine    Comment: Heavy user per patient, last use Tuesday 11/08/13  LAST CRACK  2015  . Sexual activity: Not Currently    Comment: abortion at 56 and 84yrs. of age   Other Topics Concern  . Not on file  Social History Narrative   Marital status: divorced; not dating      Children: none      Lives: alone with mom      Employment: disability for mental illness in 1997.  Hospitalizations x 9 in past.      Tobacco: 1 ppd x since age 87. Decreasing in 2018.      Alcohol: socially; beers.      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      Exercise: yoga daily; exercise daily.      ADLs: independent with ADLs; no car; depends on others for transportation.   Family History  Problem Relation Age of Onset  . Diabetes Mother   . Hypertension Mother   . Stroke Mother 72       CVA  . Mental illness Mother        no diagnosis; personality disorder  . Hyperlipidemia Father   . Hypertension Father   . COPD Father   . Alpha-1 antitrypsin deficiency Father   . Alpha-1 antitrypsin deficiency Brother   . Diabetes Maternal Grandmother   . Heart disease Maternal Grandmother   . Hyperlipidemia Maternal Grandmother   . Hypertension Maternal Grandmother   . Mental illness Maternal Grandmother   . Heart disease Maternal Grandfather   . Hyperlipidemia Maternal Grandfather   . Hypertension Maternal Grandfather   . Heart disease Paternal Grandfather   . Hyperlipidemia Paternal Grandfather   . Hypertension Paternal Grandfather   . Stroke Paternal Grandfather    Past Surgical History:  Procedure Laterality Date  . BREAST SURGERY    . DILATION AND CURETTAGE OF UTERUS    . FACIAL COSMETIC SURGERY     right cheek  . LAPAROSCOPY Right 11/11/2013   Procedure: LAPAROSCOPY OPERATIVE with  Drainage of  RIGHT Ovarian  ENDOMETRIOMA;  Surgeon: Elveria Royals, MD;  Location: [facility];  Service: Gynecology;  Laterality: Right;  . NOSE SURGERY     rhinoplasty at age 23 yrs  . WISDOM TOOTH EXTRACTION       Vanessa Kick, MD 06/02/17 [phone]

## 2017-06-23 ENCOUNTER — Encounter: Payer: Self-pay | Admitting: Neurology

## 2017-07-15 DIAGNOSIS — M542 Cervicalgia: Secondary | ICD-10-CM | POA: Insufficient documentation

## 2017-07-15 DIAGNOSIS — M25512 Pain in left shoulder: Secondary | ICD-10-CM | POA: Insufficient documentation

## 2017-08-03 ENCOUNTER — Encounter: Payer: Self-pay | Admitting: Neurology

## 2017-08-27 ENCOUNTER — Encounter: Payer: Self-pay | Admitting: Family Medicine

## 2017-09-25 ENCOUNTER — Emergency Department (HOSPITAL_COMMUNITY): Payer: Medicare Other

## 2017-09-25 ENCOUNTER — Emergency Department (HOSPITAL_COMMUNITY)
Admission: EM | Admit: 2017-09-25 | Discharge: 2017-09-25 | Disposition: A | Discharge status: Home / Self Care | Payer: Medicare Other | Attending: Emergency Medicine | Admitting: Emergency Medicine

## 2017-09-25 ENCOUNTER — Other Ambulatory Visit: Payer: Self-pay

## 2017-09-25 DIAGNOSIS — R51 Headache: Secondary | ICD-10-CM | POA: Diagnosis not present

## 2017-09-25 DIAGNOSIS — J449 Chronic obstructive pulmonary disease, unspecified: Secondary | ICD-10-CM | POA: Insufficient documentation

## 2017-09-25 DIAGNOSIS — F17210 Nicotine dependence, cigarettes, uncomplicated: Secondary | ICD-10-CM | POA: Diagnosis not present

## 2017-09-25 DIAGNOSIS — Z79899 Other long term (current) drug therapy: Secondary | ICD-10-CM | POA: Insufficient documentation

## 2017-09-25 DIAGNOSIS — R519 Headache, unspecified: Secondary | ICD-10-CM

## 2017-09-25 NOTE — ED Notes (Signed)
Signature pad not working properly in room so pt unable to sign. Discharge instructions reviewed, pt has no questions.
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
## 2017-09-25 NOTE — ED Triage Notes (Signed)
Per Pt: Pt had swelling in December related to a reaction to nicotine gum.  Pt reports she had a CT scan months ago and had swelling with no complications and the swelling went away naturally. Pt had some crown work done and since then she has had an increase in oral swelling.  Pt reports the swelling started about 24 days ago and knows she's going to need more dental work.  Pt reports she has been on a steroid with little to no relief and also has an oral brace.  Pt has been on a liquid diet and is unable to bite down and has had to be on a liquid diet the last 3 weeks.  Pt reports she is concerned that something else is wrong that is not dental.

## 2017-09-25 NOTE — Discharge Instructions (Addendum)
Return here as needed.  Follow-up with your dentist for reevaluation.  Your CT scan did not show any abnormalities.

## 2017-10-06 LAB — PULMONARY FUNCTION TEST
DL/VA % PRED: 64 %
DL/VA: 3.34 ml/min/mmHg/L
DLCO COR: 19.95 ml/min/mmHg
DLCO cor % pred: 69 %
DLCO unc % pred: 72 %
DLCO unc: 20.65 ml/min/mmHg
FEF 25-75 Post: 1.95 L/sec
FEF 25-75 Pre: 1.35 L/sec
FEF2575-%CHANGE-POST: 44 %
FEF2575-%PRED-POST: 62 %
FEF2575-%Pred-Pre: 43 %
FEV1-%CHANGE-POST: 12 %
FEV1-%PRED-PRE: 79 %
FEV1-%Pred-Post: 89 %
FEV1-Post: 2.88 L
FEV1-Pre: 2.57 L
FEV1FVC-%Change-Post: 8 %
FEV1FVC-%Pred-Pre: 78 %
FEV6-%Change-Post: 4 %
FEV6-%PRED-POST: 105 %
FEV6-%Pred-Pre: 101 %
FEV6-POST: 4.15 L
FEV6-PRE: 3.99 L
FEV6FVC-%CHANGE-POST: 0 %
FEV6FVC-%PRED-PRE: 100 %
FEV6FVC-%Pred-Post: 100 %
FVC-%Change-Post: 3 %
FVC-%PRED-POST: 104 %
FVC-%PRED-PRE: 100 %
FVC-POST: 4.21 L
FVC-PRE: 4.06 L
POST FEV1/FVC RATIO: 68 %
PRE FEV6/FVC RATIO: 98 %
Post FEV6/FVC ratio: 99 %
Pre FEV1/FVC ratio: 63 %
RV % PRED: 139 %
RV: 2.59 L
TLC % pred: 118 %
TLC: 6.56 L

## 2017-10-09 ENCOUNTER — Encounter: Payer: Self-pay | Admitting: Neurology

## 2017-10-09 ENCOUNTER — Ambulatory Visit (INDEPENDENT_AMBULATORY_CARE_PROVIDER_SITE_OTHER): Payer: Medicare Other | Admitting: Neurology

## 2017-10-09 VITALS — BP 112/74 | HR 66 | Ht 67.5 in | Wt 136.0 lb

## 2017-10-09 DIAGNOSIS — D352 Benign neoplasm of pituitary gland: Secondary | ICD-10-CM

## 2017-10-09 DIAGNOSIS — R531 Weakness: Secondary | ICD-10-CM | POA: Diagnosis not present

## 2017-10-09 DIAGNOSIS — R51 Headache: Secondary | ICD-10-CM

## 2017-10-09 DIAGNOSIS — R519 Headache, unspecified: Secondary | ICD-10-CM

## 2017-10-09 NOTE — Patient Instructions (Addendum)
We will check MRI of brain with and without contrast.  Further recommendations pending results.  We have sent a referral to [facility] for your MRI and they will call you directly to schedule your appt. They are located at [address]. If you need to contact them directly please call [phone].

## 2017-10-09 NOTE — Progress Notes (Signed)
NEUROLOGY CONSULTATION NOTE  [name] MRN: [id number] DOB: 1970/05/22  Referring provider: [name], APMH-NP-BC Primary care provider: No PCP  Reason for consult:  Left facial pain  HISTORY OF PRESENT ILLNESS: Catherine Olsen is a 47 year old right-handed female with fibromyalgia, migraine, COPD, depression, anxiety, Bipolar 1 disorder, PTSD who presents for left facial pain.  History supplemented by psychiatry and ED notes.  She has bilateral maxillofacial prosthesis.  In May 2016, she began left jaw pain and was diagnosed with TMJ dysfunction.  She was subsequently seen by a dentist who did not agree with this diagnosis.  It became worse and associated with left facial swelling last fall after she was persistently chewing nicotine gum.  She was at first treated with steroids as well as antibiotics.  She was evaluated by her maxillofacial surgeon who told her the prosthesis was okay.  CT maxillofacial from 04/11/17 and was unremarkable.  She then had crown work done by her dentist, which she believes further complicated things.  She had a repeat CT maxillofacial on 09/25/17, which was also negative.  Sometimes, she reports that the left side of her face will "pull up".  There is no associated burning pain, electric pain or paresthesias.    She also reports other symptoms of concern.  For several years, she will experience intermittent left leg weakness.  She says it feels like that her left leg "does not want to move".  It will feel weak.  More recently, she notes involvement of the left arm as well.  There is no associated pain or numbness.  It would last several weeks and occur about every two months.  She reports intermittent poor vision as well.  She reports history of pituitary microadenoma.  Last brain MRI with and without contrast from 02/21/15 was personally reviewed and demonstrated 6.7 mm pituitary microadenoma, otherwise normal brain.  PAST MEDICAL HISTORY: Past Medical History:    Diagnosis Date  . Allergy   . Anxiety   . Arthritis    hands, lower back, knees  . Asthma   . Bipolar 1 disorder ([place])   . COPD (chronic obstructive pulmonary disease) ([place])   . Depression   . Endometriosis   . Fibromyalgia   . Headache(784.0)    otc med prn  . Migraine   . Pituitary tumor   . PONV (postoperative nausea and vomiting)   . PTSD (post-traumatic stress disorder)   . Scoliosis   . Termination of pregnancy    x 2 at age 59 and 47 yrs old    PAST SURGICAL HISTORY: Past Surgical History:  Procedure Laterality Date  . BREAST SURGERY    . DILATION AND CURETTAGE OF UTERUS    . FACIAL COSMETIC SURGERY     right cheek  . LAPAROSCOPY Right 11/11/2013   Procedure: LAPAROSCOPY OPERATIVE with  Drainage of  RIGHT Ovarian ENDOMETRIOMA;  Surgeon: Elveria Royals, MD;  Location: [facility];  Service: Gynecology;  Laterality: Right;  . NOSE SURGERY     rhinoplasty at age 50 yrs  . WISDOM TOOTH EXTRACTION      MEDICATIONS: Current Outpatient Medications on File Prior to Visit  Medication Sig Dispense Refill  . tiZANidine (ZANAFLEX) 4 MG capsule Take 4 mg by mouth daily.    Marland Kitchen albuterol (VENTOLIN HFA) 108 (90 Base) MCG/ACT inhaler INHALE 2 PUFFS INTO THE LUNGS EVERY 6 HOURS AS NEEDED FOR WHEEZING OR SHORTNESS OF BREATH 18 g 2  . alprazolam (XANAX) 2  MG tablet TK 1 T PO BID PRF SEVERE ANXIETY/AGITATION  1  . [CANCELED] beclomethasone (QVAR) 40 MCG/ACT inhaler Use two puffs twice daily for an asthma flare.  Rinse, gargle and spit after use. 1 Inhaler 2  . celecoxib (CELEBREX) 200 MG capsule Take 200 mg by mouth daily.    Marland Kitchen estradiol (CLIMARA - DOSED IN MG/24 HR) 0.025 mg/24hr patch APP 1 PA EXT TO THE SKIN Q 3 DAYS  12  . HYDROcodone-acetaminophen (NORCO) 5-325 MG tablet Take 1 tablet by mouth every 6 (six) hours as needed. (Patient not taking: Reported on 10/09/2017) 15 tablet 0  . hydrOXYzine (ATARAX/VISTARIL) 25 MG tablet Take 1 tablet (25 mg total) by mouth every 6 (six) hours.  (Patient not taking: Reported on 10/09/2017) 20 tablet 0  . ibuprofen (ADVIL,MOTRIN) 800 MG tablet Take 1 tablet (800 mg total) by mouth every 8 (eight) hours as needed. 60 tablet 1  . predniSONE (STERAPRED UNI-PAK 48 TAB) 10 MG (48) TBPK tablet Take as directed. (Patient not taking: Reported on 10/09/2017) 48 tablet 0  . progesterone (PROMETRIUM) 200 MG capsule TK ONE C PO  ONCE A DAY X 10 DAYS  3  . temazepam (RESTORIL) 30 MG capsule Take 30 mg by mouth at bedtime as needed for sleep.     No current facility-administered medications on file prior to visit.     ALLERGIES: Allergies  Allergen Reactions  . Sulfamethoxazole Anaphylaxis  . Oxycodone Other (See Comments)    Severe blindness?  . Abilify [Aripiprazole]     Generic Abilify--causes severe neurological problems. Can use name brand  . Penicillins Hives    Has patient had a PCN reaction causing immediate rash, facial/tongue/throat swelling, SOB or lightheadedness with hypotension: no Has patient had a PCN reaction causing severe rash involving mucus membranes or skin necrosis: NO Has patient had a PCN reaction that required hospitalizationNO Has patient had a PCN reaction occurring within the last 10 years:NO If all of the above answers are "NO", then may proceed with Cephalosporin use.   . Other Anxiety    steroids  . Percocet [Oxycodone-Acetaminophen] Palpitations  . Sulfa Antibiotics Hives  . Sulfites Other (See Comments)    unknown    FAMILY HISTORY: Family History  Problem Relation Age of Onset  . Diabetes Mother   . Hypertension Mother   . Stroke Mother 20       CVA  . Mental illness Mother        no diagnosis; personality disorder  . Hyperlipidemia Father   . Hypertension Father   . COPD Father   . Alpha-1 antitrypsin deficiency Father   . Alpha-1 antitrypsin deficiency Brother   . Diabetes Maternal Grandmother   . Heart disease Maternal Grandmother   . Hyperlipidemia Maternal Grandmother   . Hypertension  Maternal Grandmother   . Mental illness Maternal Grandmother   . Heart disease Maternal Grandfather   . Hyperlipidemia Maternal Grandfather   . Hypertension Maternal Grandfather   . Heart disease Paternal Grandfather   . Hyperlipidemia Paternal Grandfather   . Hypertension Paternal Grandfather   . Stroke Paternal Grandfather   . Alzheimer's disease Paternal Grandmother     SOCIAL HISTORY: Social History   Socioeconomic History  . Marital status: Divorced    Spouse name: Not on file  . Number of children: 0  . Years of education: Not on file  . Highest education level: Bachelor's degree (e.g., BA, AB, BS)  Occupational History  . Occupation: disability  Comment: mental illness  Social Needs  . Financial resource strain: Not on file  . Food insecurity:    Worry: Not on file    Inability: Not on file  . Transportation needs:    Medical: Not on file    Non-medical: Not on file  Tobacco Use  . Smoking status: Current Some Day Smoker    Packs/day: 0.50    Years: 29.00    Pack years: 14.50    Types: Cigarettes, E-cigarettes  . Smokeless tobacco: Never Used  Substance and Sexual Activity  . Alcohol use: Not Currently    Comment: "  . Drug use: Yes    Types: "Crack" cocaine, Cocaine    Comment: Heavy user per patient, last use Tuesday 11/08/13  LAST CRACK  2015  . Sexual activity: Not Currently    Comment: abortion at 32 and 33yrs. of age   Lifestyle  . Physical activity:    Days per week: Not on file    Minutes per session: Not on file  . Stress: Not on file  Relationships  . Social connections:    Talks on phone: Not on file    Gets together: Not on file    Attends religious service: Not on file    Active member of club or organization: Not on file    Attends meetings of clubs or organizations: Not on file    Relationship status: Not on file  . Intimate partner violence:    Fear of current or ex partner: Not on file    Emotionally abused: Not on file     Physically abused: Not on file    Forced sexual activity: Not on file  Other Topics Concern  . Not on file  Social History Narrative   Marital status: divorced; not dating      Children: none      Lives: alone with mom      Employment: disability for mental illness in 1997.  Hospitalizations x 9 in past.      Tobacco: 1 ppd x since age 74. Decreasing in 2018.      Alcohol: socially; beers.      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      Exercise: yoga daily; exercise daily.      ADLs: independent with ADLs; no car; depends on others for transportation.      Patient is right-handed. She is divorced and lives with her mother. She drinks 2-3 cups of 1/2 caffeine coffe a day. She walks occasionally for exercise.    REVIEW OF SYSTEMS: Constitutional: No fevers, chills, or sweats, no generalized fatigue, change in appetite Eyes: intermittent vision loss, peripheral vision loss Ear, nose and throat: No hearing loss, ear pain, nasal congestion, sore throat Cardiovascular: No chest pain, palpitations Respiratory:  No shortness of breath at rest or with exertion, wheezes GastrointestinaI: No nausea, vomiting, diarrhea, abdominal pain, fecal incontinence Genitourinary:  No dysuria, urinary retention or frequency Musculoskeletal:  No neck pain, back pain Integumentary: No rash, pruritus, skin lesions Neurological: as above Psychiatric: depression,anxiety Endocrine: No palpitations, fatigue, diaphoresis, mood swings, change in appetite, change in weight, increased thirst Hematologic/Lymphatic:  No purpura, petechiae. Allergic/Immunologic: no itchy/runny eyes, nasal congestion, recent allergic reactions, rashes  PHYSICAL EXAM: Vitals:   10/09/17 1223  BP: 112/74  Pulse: 66  SpO2: 99%   General: No acute distress.  Head:  Normocephalic/atraumatic Eyes:  fundi examined but not visualized Neck: supple, no paraspinal tenderness, full range of  motion Back: No  paraspinal tenderness Heart: regular rate and rhythm Lungs: Clear to auscultation bilaterally. Vascular: No carotid bruits. Neurological Exam: Mental status: alert and oriented to person, place, and time, recent and remote memory intact, fund of knowledge intact, attention and concentration intact, speech fluent and not dysarthric, language intact. Cranial nerves: CN I: not tested CN II: pupils equal, round and reactive to light, visual fields intact CN III, IV, VI:  full range of motion, no nystagmus, no ptosis CN V: hypersensitivity to left side of face CN VII: upper and lower face symmetric CN VIII: hearing intact CN IX, X: gag intact, uvula midline CN XI: sternocleidomastoid and trapezius muscles intact CN XII: tongue midline Bulk & Tone: normal, no fasciculations. Motor:  5-/5 left hip flexion.  Otherwise, 5/5. Sensation:  Pinprick and vibration sensation intact. Deep Tendon Reflexes:  2+ throughout, toes downgoing.  Finger to nose testing:  Without dysmetria.  Heel to shin:  Without dysmetria.  Gait:  Normal station and stride.  Able to turn and tandem walk. Romberg negative.  IMPRESSION: Left facial pain and swelling.  It does not appear to be trigeminal neuralgia.  I don't think it is a primary neurologic process.  Nerve damage should not cause swelling.  She reports episodes where the left side of her face will start to pull.  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  Treatment would be Botox, however I do not perform this and I don't know if it will be indicated given her other complex history involving her face.  I have no explanation for her intermittent left sided weakness.  She exhibits no radicular pain or upper motor neuron signs.  History of pituitary microadenoma  PLAN: To further assess intermittent left sided weakness, we will check MRI of brain with and without contrast, as well as attention to  the pituitary gland.  Thank you for allowing me to take part in the care of this patient.  Metta Clines, DO  CC: [name], APMH-NP-BC

## 2017-10-29 ENCOUNTER — Encounter: Payer: Self-pay | Admitting: Family Medicine

## 2017-10-29 ENCOUNTER — Ambulatory Visit (INDEPENDENT_AMBULATORY_CARE_PROVIDER_SITE_OTHER): Payer: Medicare Other | Admitting: Family Medicine

## 2017-10-29 VITALS — BP 112/70 | HR 69 | Temp 98.4°F | Ht 67.5 in | Wt 137.4 lb

## 2017-10-29 DIAGNOSIS — G500 Trigeminal neuralgia: Secondary | ICD-10-CM

## 2017-10-29 DIAGNOSIS — Z0000 Encounter for general adult medical examination without abnormal findings: Secondary | ICD-10-CM

## 2017-10-29 DIAGNOSIS — F99 Mental disorder, not otherwise specified: Secondary | ICD-10-CM

## 2017-10-29 DIAGNOSIS — Z1389 Encounter for screening for other disorder: Secondary | ICD-10-CM

## 2017-10-29 DIAGNOSIS — M064 Inflammatory polyarthropathy: Secondary | ICD-10-CM | POA: Diagnosis not present

## 2017-10-29 DIAGNOSIS — Z1383 Encounter for screening for respiratory disorder NEC: Secondary | ICD-10-CM

## 2017-10-29 DIAGNOSIS — G43709 Chronic migraine without aura, not intractable, without status migrainosus: Secondary | ICD-10-CM | POA: Diagnosis not present

## 2017-10-29 DIAGNOSIS — Z9109 Other allergy status, other than to drugs and biological substances: Secondary | ICD-10-CM | POA: Diagnosis not present

## 2017-10-29 DIAGNOSIS — F5105 Insomnia due to other mental disorder: Secondary | ICD-10-CM | POA: Diagnosis not present

## 2017-10-29 DIAGNOSIS — Z136 Encounter for screening for cardiovascular disorders: Secondary | ICD-10-CM

## 2017-10-29 DIAGNOSIS — Z130 Encounter for screening for diseases of the blood and blood-forming organs and certain disorders involving the immune mechanism: Secondary | ICD-10-CM

## 2017-10-29 DIAGNOSIS — R9431 Abnormal electrocardiogram [ECG] [EKG]: Secondary | ICD-10-CM

## 2017-10-29 DIAGNOSIS — Z1329 Encounter for screening for other suspected endocrine disorder: Secondary | ICD-10-CM

## 2017-10-29 LAB — POCT URINALYSIS DIP (MANUAL ENTRY)
BILIRUBIN UA: NEGATIVE mg/dL
Bilirubin, UA: NEGATIVE
Glucose, UA: NEGATIVE mg/dL
Leukocytes, UA: NEGATIVE
Nitrite, UA: NEGATIVE
PROTEIN UA: NEGATIVE mg/dL
RBC UA: NEGATIVE
Spec Grav, UA: 1.01 (ref 1.010–1.025)
UROBILINOGEN UA: 0.2 U/dL
pH, UA: 7 (ref 5.0–8.0)

## 2017-10-29 MED ORDER — HYDROCODONE-ACETAMINOPHEN 5-325 MG PO TABS: 1.0000 | ORAL_TABLET | Freq: Four times a day (QID) | ORAL | 0 refills | Status: DC | PRN

## 2017-10-29 MED ORDER — EPINEPHRINE 0.3 MG/0.3ML IJ SOAJ: 0.3000 mg | Freq: Once | INTRAMUSCULAR | 2 refills | Status: AC

## 2017-10-29 MED ORDER — ALBUTEROL SULFATE HFA 108 (90 BASE) MCG/ACT IN AERS: INHALATION_SPRAY | RESPIRATORY_TRACT | 11 refills | Status: DC

## 2017-10-29 NOTE — Progress Notes (Addendum)
Subjective:  By signing my name below, I, Moises Blood, attest that this documentation has been prepared under the direction and in the presence of [name], MD. Electronically Signed: Moises Blood, [place]. 10/29/2017 , 2:28 PM .  Patient was seen in Room 3 .   Patient ID: Catherine Olsen, female    DOB: December 07, 1970, 47 y.o.   MRN: [id number] Chief Complaint  Patient presents with  . Annual Exam    would like to wait on pap   HPI  Catherine Olsen is a 47 yo here for a full physical. She is a complex pt with an extensive PMHx including history of several cosmetic facial surgeries, anxiety, bipolar disorder, fibromyalgia and depression.  She has been followed regularly by one of my former colleagues as her PCP.  I met pt twice prev for acute pain issues >2 yrs prior but have not seen her since.  Primary Preventative Screenings: Cervical Cancer: sees gyn [facility] Dr. Marzetta Board regularly though last pap was done here by Dr. Tamala Julian on 01/26/2015 and is due to be repeated in 3 mos - does have distant h/o abnml >10 yrs ago per pt. Family Planning: post-menopausal on HRT by gyn [facility] Dr. Marzetta Board regularly STI screening: managed by gyn [facility] Dr. Marzetta Board regularly, neg HIV screen 01/2014, neg Hep C abs 07/2012 Breast Cancer:managed by gyn [facility] Dr. Marzetta Board regularly with mammograms annually since her early 14s. She states her breast exams are done annually at [facility] surgery with the last being April 2019. Colorectal Cancer: AN due to age Tobacco use/EtOH/substances:prev smoker but stopped with patches Bone Density: Had a baseline DEXA bone scan 10/2015 that was normal with results available in care everywhere. managed by gyn [facility] Dr. Marzetta Board who will ordered her f/u as she is at high risk due to premature menopause around 47 years of age without starting HRT until around 47 years of age, also with lean BMI and history of prolonged tobacco use. Cardiac: baseline EKG done 09/02/2013  - NSR but did have prolonged QT interval at 464 MS with QTc the same.  Heart rate 60.  Septal T wave changes nonspecific. Weight/Blood sugar/Diet/Exercise: BMI Readings from Last 3 Encounters:  10/29/17 21.20 kg/m  10/09/17 20.99 kg/m  09/25/17 20.88 kg/m   Lab Results  Component Value Date   HGBA1C 5.5 02/22/2015   OTC/Vit/Supp/Herbal: 1673m calcium daily, vitamin D supplement, mvi, and protein shake Dentist/Optho:sees dental regularly - esp recently due to implants with facial pain and swelling Immunizations:  There is no immunization history for the selected administration types on file for this patient.   Chronic Medical Conditions: Migraine: States she mentioned to neurology Dr. JLoretta Plumewho recommended that she continue her prior episodic treatment for migraines with her PCP since they only occur 4-5x/yr which she treats with hydrocodone initially from Dr. LMarin Commentand then from Dr. STamala Julian  She states she generally only needs 10-20 hydrocodone annually.   She is requesting esr and ana done on her labs  Alllergist told her that she would eventually develop lupus and [facility] and was just told she could have fibromyalgia.  Orthopedist told her that she has some time of autoimmune spinal issues. If she goes out to the sun, she gets allergic.  Wearing nicotine patch again - there was a nicotine gum patch that caused severe swelling in winter 2018-2019  Menopause killed her sex drive - had to  have surgery after ER visit  LMP 11/2014 so on estradiol from her gynecologist Dr. Rolin Barry at [facility] as went through early menopause ~47 yo  Diagnosed with Trigeminal Neuralgia from [facility] facial Surgeon and has been confirmed by other physicians.    Patient Active Problem List   Diagnosis Date Noted  . Environmental allergies 10/29/2017  . 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 Chronic migraine without aura without status migrainosus,  not intractable 03/24/2017  . Chronic midline low back pain without sciatica 03/24/2017  . Pulmonary emphysema ([place]) 02/19/2017  . Jaw pain 02/11/2017  . Smoking history 12/09/2016  . Exertional dyspnea 12/09/2016  . Family history of alpha 1 antitrypsin deficiency 09/04/2016  . 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 Nodule of left lung 09/04/2016  . Cervical lymphadenopathy 01/29/2016  . Perimenopausal vasomotor symptoms 05/25/2015  . Asthma with acute exacerbation 04/18/2015  . Chronic rhinitis 04/18/2015  . Pituitary microadenoma ([place]) 05/19/2014  . Pituitary abnormality ([place]) 05/19/2014  . Endometriosis of ovary 04/03/2014  . Ovarian cyst, right 04/03/2014  . 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[place]) 09/13/2012  . Posttraumatic stress disorder 09/13/2012  . Fibrocystic breast disease 08/24/2012  . Myalgia and myositis 07/26/2012  . Inflammatory polyarthropathy ([place]) 05/30/2009  . SEXUAL ABUSE, HX OF 01/09/2009  . Bipolar disorder ([place]) 01/17/2008  . INSOMNIA 01/17/2008  . NEVI, MULTIPLE 07/17/2006  . KERATOSIS, SEBORRHEIC [place] 07/17/2006  . ACNE NEC 07/17/2006     Past Medical History:  Diagnosis Date  . Allergy   . Anxiety   . Arthritis    hands, lower back, knees  . Asthma   . Bipolar 1 disorder ([place])   . COPD (chronic obstructive pulmonary disease) ([place])   . Depression   . Endometriosis   . Fibromyalgia   . Headache(784.0)    otc med prn  . Migraine   . Pituitary tumor   . PONV (postoperative nausea and vomiting)   . PTSD (post-traumatic stress disorder)   . Scoliosis   . Termination of pregnancy    x 2 at age 46 and 47 yrs old    Past Surgical History:  Procedure Laterality Date  . BREAST SURGERY    . DILATION AND CURETTAGE OF UTERUS    . FACIAL COSMETIC SURGERY     right cheek  . LAPAROSCOPY Right 11/11/2013   Procedure: LAPAROSCOPY OPERATIVE with  Drainage of  RIGHT Ovarian ENDOMETRIOMA;  Surgeon: Elveria Royals, MD;  Location: [facility];  Service:  Gynecology;  Laterality: Right;  . NOSE SURGERY     rhinoplasty at age 17 yrs  . WISDOM TOOTH EXTRACTION     Current Outpatient Medications on File Prior to Visit  Medication Sig Dispense Refill  . albuterol (VENTOLIN HFA) 108 (90 Base) MCG/ACT inhaler INHALE 2 PUFFS INTO THE LUNGS EVERY 6 HOURS AS NEEDED FOR WHEEZING OR SHORTNESS OF BREATH 18 g 2  . alprazolam (XANAX) 2 MG tablet TK 1 T PO BID PRF SEVERE ANXIETY/AGITATION  1  . EPINEPHrine 0.3 mg/0.3 mL IJ SOAJ injection Inject into the muscle once.    Marland Kitchen estradiol (CLIMARA - DOSED IN MG/24 HR) 0.025 mg/24hr patch APP 1 PA EXT TO THE SKIN Q 3 DAYS  12  . ibuprofen (ADVIL,MOTRIN) 800 MG tablet Take 1 tablet (800 mg total) by mouth every 8 (eight) hours as needed. 60 tablet 1  . progesterone (PROMETRIUM) 200 MG capsule TK ONE C PO  ONCE A DAY X 10 DAYS  3  . [CANCELED] beclomethasone (QVAR) 40  MCG/ACT inhaler Use two puffs twice daily for an asthma flare.  Rinse, gargle and spit after use. 1 Inhaler 2  . celecoxib (CELEBREX) 200 MG capsule Take 200 mg by mouth daily.    Marland Kitchen HYDROcodone-acetaminophen (NORCO) 5-325 MG tablet Take 1 tablet by mouth every 6 (six) hours as needed. (Patient not taking: Reported on 10/09/2017) 15 tablet 0   No current facility-administered medications on file prior to visit.    Allergies  Allergen Reactions  . Sulfamethoxazole Anaphylaxis  . Oxycodone Other (See Comments)    Severe blindness?  . Abilify [Aripiprazole]     Generic Abilify--causes severe neurological problems. Can use name brand  . Penicillins Hives    Has patient had a PCN reaction causing immediate rash, facial/tongue/throat swelling, SOB or lightheadedness with hypotension: no Has patient had a PCN reaction causing severe rash involving mucus membranes or skin necrosis: NO Has patient had a PCN reaction that required hospitalizationNO Has patient had a PCN reaction occurring within the last 10 years:NO If all of the above answers are "NO", then  may proceed with Cephalosporin use.   . Other Anxiety    steroids  . Percocet [Oxycodone-Acetaminophen] Palpitations  . Sulfa Antibiotics Hives  . Sulfites Other (See Comments)    unknown   Family History  Problem Relation Age of Onset  . Diabetes Mother   . Hypertension Mother   . Stroke Mother 68       CVA  . Mental illness Mother        no diagnosis; personality disorder  . Hyperlipidemia Father   . Hypertension Father   . COPD Father   . Alpha-1 antitrypsin deficiency Father   . Alpha-1 antitrypsin deficiency Brother   . Diabetes Maternal Grandmother   . Heart disease Maternal Grandmother   . Hyperlipidemia Maternal Grandmother   . Hypertension Maternal Grandmother   . Mental illness Maternal Grandmother   . Heart disease Maternal Grandfather   . Hyperlipidemia Maternal Grandfather   . Hypertension Maternal Grandfather   . Heart disease Paternal Grandfather   . Hyperlipidemia Paternal Grandfather   . Hypertension Paternal Grandfather   . Stroke Paternal Grandfather   . Alzheimer's disease Paternal Grandmother     Social History   Socioeconomic History  . Marital status: Divorced    Spouse name: Not on file  . Number of children: 0  . Years of education: Not on file  . Highest education level: Bachelor's degree (e.g., BA, AB, BS)  Occupational History  . Occupation: disability    Comment: mental illness  Social Needs  . Financial resource strain: Not on file  . Food insecurity:    Worry: Not on file    Inability: Not on file  . Transportation needs:    Medical: Not on file    Non-medical: Not on file  Tobacco Use  . Smoking status: Current Some Day Smoker    Packs/day: 0.50    Years: 29.00    Pack years: 14.50    Types: Cigarettes, E-cigarettes  . Smokeless tobacco: Never Used  Substance and Sexual Activity  . Alcohol use: Not Currently    Comment: "  . Drug use: Yes    Types: "Crack" cocaine, Cocaine    Comment: Heavy user per patient, last use  Tuesday 11/08/13  LAST CRACK  2015  . Sexual activity: Not Currently    Comment: abortion at 16 and 89yr. of age   Lifestyle  . Physical activity:    Days per week:  Not on file    Minutes per session: Not on file  . Stress: Not on file  Relationships  . Social connections:    Talks on phone: Not on file    Gets together: Not on file    Attends religious service: Not on file    Active member of club or organization: Not on file    Attends meetings of clubs or organizations: Not on file    Relationship status: Not on file  Other Topics Concern  . Not on file  Social History Narrative   Marital status: divorced; not dating      Children: none      Lives: alone with mom      Employment: disability for mental illness in 1997.  Hospitalizations x 9 in past.      Tobacco: 1 ppd x since age 4. Decreasing in 2018.      Alcohol: socially; beers.      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      Exercise: yoga daily; exercise daily.      ADLs: independent with ADLs; no car; depends on others for transportation.      Patient is right-handed. She is divorced and lives with her mother. She drinks 2-3 cups of 1/2 caffeine coffe a day. She walks occasionally for exercise.   Depression screen [facility] 2/9 04/18/2017 03/18/2017 02/11/2017 09/04/2016 12/26/2015  Decreased Interest - 0 0 1 0  Down, Depressed, Hopeless 3 0 0 1 0  PHQ - 2 Score 3 0 0 2 0  Altered sleeping - - - - -  Tired, decreased energy - - - - -  Change in appetite - - - - -  Feeling bad or failure about yourself  - - - - -  Trouble concentrating - - - - -  Moving slowly or fidgety/restless - - - - -  Suicidal thoughts - - - - -  PHQ-9 Score - - - - -  Difficult doing work/chores - - - - -  Some recent data might be hidden    Review of Systems  All other systems reviewed and are negative. other than as noted in hpi    Objective:   Physical Exam  Constitutional: She is oriented to person, place, and  time. She appears well-developed and well-nourished. No distress.  HENT:  Head: Normocephalic and atraumatic.  Eyes: Pupils are equal, round, and reactive to light. EOM are normal.  Neck: Neck supple.  Cardiovascular: Normal rate.  Pulmonary/Chest: Effort normal. No respiratory distress.  Musculoskeletal: Normal range of motion.  Neurological: She is alert and oriented to person, place, and time.  Skin: Skin is warm and dry.  Psychiatric: She has a normal mood and affect. Her behavior is normal.  Nursing note and vitals reviewed.   BP 112/70 (BP Location: Right Arm, Patient Position: Sitting, Cuff Size: Normal)   Pulse 69   Temp 98.4 F (36.9 C) (Oral)   Ht 5' 7.5" (1.715 m)   Wt 137 lb 6.4 oz (62.3 kg)   LMP 11/21/2014 Comment: Pt. states she is having bleeding today and has been for 9 days.  SpO2 97%   BMI 21.20 kg/m   Results for orders placed or performed in visit on 10/29/17  POCT urinalysis dipstick  Result Value Ref Range   Color, UA yellow yellow   Clarity, UA clear clear   Glucose, UA negative negative mg/dL   Bilirubin, UA negative negative   Ketones,  POC UA negative negative mg/dL   Spec Grav, UA 1.010 1.010 - 1.025   Blood, UA negative negative   pH, UA 7.0 5.0 - 8.0   Protein Ur, POC negative negative mg/dL   Urobilinogen, UA 0.2 0.2 or 1.0 E.U./dL   Nitrite, UA Negative Negative   Leukocytes, UA Negative Negative    Visual Acuity Screening   Right eye Left eye Both eyes  Without correction: 20/20 20/20 20/20  With correction:          Assessment & Plan:   1. Annual physical exam - pap and breast exam deferred as pt will see her gynecologist for these Refuses TDAP  2. Screening for cardiovascular, respiratory, and genitourinary diseases   3. Screening for thyroid disorder   4. Screening for deficiency anemia   5. Chronic migraine without aura without status migrainosus, not intractable   6. Inflammatory polyarthropathy (HCC) - REFILL IBUPROFEN ONCE  RENAL FUNCTION RETURNS. Addendum: renal fxn normal -sent in ibuprofen 869m q8hrs prn #60 with 2 refills on 01/02/18.  7. Insomnia due to other mental disorder   8. Environmental allergies - refer to allergist  9.      Trigeminal neuralgia pain 10.    Prolonged Q-T interval on ECG - On patient's prior EKG done in 2015 she was noted to have some mild QT prolongation.  Highly recommend that EKG is repeated at patient's next office visit as this needs to be confirmed whether it is still persisting and whether she needs further eval. there is no other mention of QT prolongation on her chart or any other additional cardiac eval in care everywhere.  Orders Placed This Encounter  Procedures  . Lipid panel    Order Specific Question:   Has the patient fasted?    Answer:   Yes  . Comprehensive metabolic panel    Order Specific Question:   Has the patient fasted?    Answer:   Yes  . CBC with Differential/Platelet  . Sedimentation Rate  . ANA Comprehensive Panel  . C-reactive protein  . TSH  . Comprehensive metabolic panel  . Lipid panel  . Ambulatory referral to Allergy    Referral Priority:   Routine    Referral Type:   Allergy Testing    Referral Reason:   Specialty Services Required    Requested Specialty:   Allergy    Number of Visits Requested:   1  . POCT urinalysis dipstick    Meds ordered this encounter  Medications  . EPINEPHrine 0.3 mg/0.3 mL IJ SOAJ injection    Sig: Inject 0.3 mLs (0.3 mg total) into the muscle once for 1 dose.    Dispense:  2 Device    Refill:  2  . albuterol (VENTOLIN HFA) 108 (90 Base) MCG/ACT inhaler    Sig: INHALE 2 PUFFS INTO THE LUNGS EVERY 6 HOURS AS NEEDED FOR WHEEZING OR SHORTNESS OF BREATH    Dispense:  18 g    Refill:  11    Pt needs only Ventolin  Only  . HYDROcodone-acetaminophen (NORCO) 5-325 MG tablet    Sig: Take 1 tablet by mouth every 6 (six) hours as needed.    Dispense:  15 tablet    Refill:  0    EDelman Cheadle MD, MPH [facility] [place]  [phone] phone  [phone] fax   01/02/18 3:42 PM

## 2017-10-29 NOTE — Patient Instructions (Addendum)
IF you received an x-ray today, you will receive an invoice from [facility]. Please contact [facility] at [phone] with questions or concerns regarding your invoice.   IF you received labwork today, you will receive an invoice from [facility]. Please contact [facility] at [phone] with questions or concerns regarding your invoice.   Our billing staff will not be able to assist you with questions regarding bills from these companies.  You will be contacted with the lab results as soon as they are available. The fastest way to get your results is to activate your My Chart account. Instructions are located on the last page of this paperwork. If you have not heard from [place] regarding the results in 2 weeks, please contact this office.   Keeping You Healthy  Get These Tests 1. Blood Pressure- Have your blood pressure checked once a year by your health care provider.  Normal blood pressure is 120/80. 2. Weight- Have your body mass index (BMI) calculated to screen for obesity.  BMI is measure of body fat based on height and weight.  You can also calculate your own BMI at GravelBags.it. 3. Cholesterol- Have your cholesterol checked every 5 years starting at age 57 then yearly starting at age 73. 15. Chlamydia, HIV, and other sexually transmitted diseases- Get screened every year until age 22, then within three months of each new sexual provider. 5. Pap Test - Every 1-5 years; discuss with your health care provider. 6. Mammogram- Every 1-2 years starting at age 18--50  Take these medicines  Calcium with Vitamin D-Your body needs 1200 mg of Calcium each day and [phone] IU of Vitamin D daily.  Your body can only absorb 500 mg of Calcium at a time so Calcium must be taken in 2 or 3 divided doses throughout the day.  Multivitamin with folic acid- Once daily if it is possible for you to become pregnant.  Get these Immunizations  Gardasil-Series of three doses;  prevents HPV related illness such as genital warts and cervical cancer.  Menactra-Single dose; prevents meningitis.  Tetanus shot- Every 10 years.  Flu shot-Every year.  Take these steps 1. Do not smoke-Your healthcare provider can help you quit.  For tips on how to quit go to www.smokefree.gov or call 1-800 QUITNOW. 2. Be physically active- Exercise 5 days a week for at least 30 minutes.  If you are not already physically active, start slow and gradually work up to 30 minutes of moderate physical activity.  Examples of moderate activity include walking briskly, dancing, swimming, bicycling, etc. 3. Breast Cancer- A self breast exam every month is important for early detection of breast cancer.  For more information and instruction on self breast exams, ask your healthcare provider or https://www.patel.info/. 4. Eat a healthy diet- Eat a variety of healthy foods such as fruits, vegetables, whole grains, low fat milk, low fat cheeses, yogurt, lean meats, poultry and fish, beans, nuts, tofu, etc.  For more information go to www. Thenutritionsource.org 5. Drink alcohol in moderation- Limit alcohol intake to one drink or less per day. Never drink and drive. 6. Depression- Your emotional health is as important as your physical health.  If you're feeling down or losing interest in things you normally enjoy please talk to your healthcare provider about being screened for depression. 7. Dental visit- Brush and floss your teeth twice daily; visit your dentist twice a year. 8. Eye doctor- Get an eye exam at least every 2 years. 9. Helmet use-  Always wear a helmet when riding a bicycle, motorcycle, rollerblading or skateboarding. 65. Safe sex- If you may be exposed to sexually transmitted infections, use a condom. 11. Seat belts- Seat belts can save your live; always wear one. 12. Smoke/Carbon Monoxide detectors- These detectors need to be installed on the appropriate level of your  home. Replace batteries at least once a year. 13. Skin cancer- When out in the sun please cover up and use sunscreen 15 SPF or higher. 14. Violence- If anyone is threatening or hurting you, please tell your healthcare provider.         Antinuclear Antibody Test Why am I having this test? This is a test used to help diagnose systemic lupus erythematosus (SLE) and other autoimmune diseases. An autoimmune disease is a disease in which the body's own defense system (immune system) attacks the body. This test checks for antinuclear antibodies (ANA) in the blood. The presence of ANA is associated with several autoimmune diseases. It is most commonly seen with SLE. What kind of sample is taken? A blood sample is required for this test. It is usually collected by inserting a needle into a vein. How do I prepare for this test? There is no preparation required for this test. How are the results reported? Your test results will be reported as either positive or negative. It is your responsibility to obtain your test results. Ask the lab or department performing the test when and how you will get your results. What do the results mean? A positive test may mean you have:  SLE.  An autoimmune disease.  Liver dysfunction.  Leukemia.  Infectious mononucleosis.  Talk with your health care provider to discuss your results, treatment options, and if necessary, the need for more tests. Talk with your health care provider if you have any questions about your results. Talk with your health care provider to discuss your results, treatment options, and if necessary, the need for more tests. Talk with your health care provider if you have any questions about your results. This information is not intended to replace advice given to you by your health care provider. Make sure you discuss any questions you have with your health care provider. Document Released: 04/15/2004 Document Revised: 11/26/2015  Document Reviewed: 08/23/2013 Elsevier Interactive Patient Education  2018 [employer]. Systemic Lupus Erythematosus, Adult Systemic lupus erythematosus is a long-term (chronic) disease that can affect many parts of the body. It can damage the skin, joints, blood vessels, brain, kidneys, lungs, heart, and other internal organs. It causes pain, irritation, and inflammation. Systemic lupus erythematosus is an autoimmune disease. With this type of disease, the body's defense system (immune system) mistakenly attacks normal tissues instead of attacking germs or abnormal growths. What are the causes? The cause of this condition is not known. What increases the risk? This condition is more likely to develop in:  Females.  People of Asian descent.  People of African-American descent.  People who have a family history of the condition.  What are the signs or symptoms? General symptoms include:  Joint pain and swelling (common).  Fever.  Fatigue.  Unusual weight loss or weight gain.  Skin rashes, especially over the nose and cheeks (butterfly rash) and after sun exposure.  Sores inside the mouth or nose.  Other symptoms depend on which parts of the body are affected. They can include:  Shortness of breath.  Chest pain.  Frequent urination.  Blood in the urine.  Seizures.  Mental changes.  Hair  loss.  Swollen and tender lymph nodes.  Swelling of the hands or feet.  Symptoms can come and go. A period of time when symptoms get worse or come back is called a flare. A period of time with no symptoms is called a remission. How is this diagnosed? This condition is diagnosed based on symptoms, a medical history, and a physical exam. You may also have tests, including:  Blood tests.  Urine tests.  A chest X-ray.  A skin or kidney biopsy. For this test, a sample of tissue is taken from the skin or kidney and studied under a microscope.  You may be referred to an  autoimmune disease specialist (rheumatologist). How is this treated? There is no cure for this condition, but treatment can keep the disease in remission, help to control symptoms, and prevent damage to the heart, lungs, kidneys, and other organs. Treatment may involve taking a combination of medicines over time. Follow these instructions at home: Medicines  Take medicines only as directed by your health care provider.  Do not take any medicines that contain estrogen without first checking with your health care provider. Estrogen can trigger flares and may increase your risk for blood clots. Lifestyle  Eat a heart-healthy diet.  Stay active as directed by your health care provider.  Do not smoke. If you need help quitting, ask your health care provider.  Protect your skin from the sun by applying sunblock and wearing protective hats and clothing.  Learn as much as you can about your condition and have a good support system in place. Support may come from family, friends, or a lupus support group. General instructions  Keep all follow-up visits as directed by your health care provider. This is important.  Work closely with all of your health care providers to manage your condition.  Let your health care provider know right away if you become pregnant or if you plan to become pregnant. Pregnancy in women with this condition is considered high risk. Contact a health care provider if:  You have a fever.  Your symptoms flare.  You develop new symptoms.  You develop swollen feet or hands.  You develop puffiness around your eyes.  Your medicines are not working.  You have bloody, foamy, or coffee-colored urine.  There are changes in your urination. For example, you urinate more often at night.  You think that you may be depressed or have anxiety. Get help right away if:  You have chest pain.  You have trouble breathing.  You have a seizure.  You suddenly get a very bad  headache.  You suddenly develop facial or body weakness.  You cannot speak.  You cannot understand speech. This information is not intended to replace advice given to you by your health care provider. Make sure you discuss any questions you have with your health care provider. Document Released: 03/14/2002 Document Revised: 11/18/2015 Document Reviewed: 03/01/2014 Elsevier Interactive Patient Education  [employer].

## 2017-10-30 LAB — COMPREHENSIVE METABOLIC PANEL
A/G RATIO: 2.1 (ref 1.2–2.2)
ALK PHOS: 49 IU/L (ref 39–117)
ALT: 18 IU/L (ref 0–32)
AST: 18 IU/L (ref 0–40)
Albumin: 4.5 g/dL (ref 3.5–5.5)
BILIRUBIN TOTAL: 0.3 mg/dL (ref 0.0–1.2)
BUN/Creatinine Ratio: 11 (ref 9–23)
BUN: 8 mg/dL (ref 6–24)
CHLORIDE: 102 mmol/L (ref 96–106)
CO2: 22 mmol/L (ref 20–29)
Calcium: 9 mg/dL (ref 8.7–10.2)
Creatinine, Ser: 0.75 mg/dL (ref 0.57–1.00)
GFR calc Af Amer: 111 mL/min/{1.73_m2} (ref >59)
GFR calc non Af Amer: 96 mL/min/{1.73_m2} (ref >59)
GLOBULIN, TOTAL: 2.1 g/dL (ref 1.5–4.5)
Glucose: 68 mg/dL (ref 65–99)
POTASSIUM: 4 mmol/L (ref 3.5–5.2)
SODIUM: 139 mmol/L (ref 134–144)
Total Protein: 6.6 g/dL (ref 6.0–8.5)

## 2017-10-30 LAB — CBC WITH DIFFERENTIAL/PLATELET
BASOS ABS: 0 10*3/uL (ref 0.0–0.2)
BASOS: 1 %
EOS (ABSOLUTE): 0.6 10*3/uL — AB (ref 0.0–0.4)
Eos: 11 %
Hematocrit: 35.1 % (ref 34.0–46.6)
Hemoglobin: 11.3 g/dL (ref 11.1–15.9)
Immature Grans (Abs): 0 10*3/uL (ref 0.0–0.1)
Immature Granulocytes: 0 %
Lymphocytes Absolute: 1.6 10*3/uL (ref 0.7–3.1)
Lymphs: 29 %
MCH: 27.6 pg (ref 26.6–33.0)
MCHC: 32.2 g/dL (ref 31.5–35.7)
MCV: 86 fL (ref 79–97)
MONOS ABS: 0.3 10*3/uL (ref 0.1–0.9)
Monocytes: 6 %
NEUTROS ABS: 2.9 10*3/uL (ref 1.4–7.0)
Neutrophils: 53 %
PLATELETS: 278 10*3/uL (ref 150–450)
RBC: 4.09 x10E6/uL (ref 3.77–5.28)
RDW: 17.5 % — AB (ref 12.3–15.4)
WBC: 5.6 10*3/uL (ref 3.4–10.8)

## 2017-10-30 LAB — LIPID PANEL
CHOLESTEROL TOTAL: 173 mg/dL (ref 100–199)
Chol/HDL Ratio: 2.5 ratio (ref 0.0–4.4)
HDL: 69 mg/dL (ref >39)
LDL CALC: 90 mg/dL (ref 0–99)
Triglycerides: 69 mg/dL (ref 0–149)
VLDL Cholesterol Cal: 14 mg/dL (ref 5–40)

## 2017-10-30 LAB — ANA COMPREHENSIVE PANEL
Chromatin Ab SerPl-aCnc: <0.2 AI (ref 0.0–0.9)
ENA RNP Ab: <0.2 AI (ref 0.0–0.9)
ENA SM Ab Ser-aCnc: <0.2 AI (ref 0.0–0.9)
Scleroderma SCL-70: <0.2 AI (ref 0.0–0.9)
dsDNA Ab: 1 IU/mL (ref 0–9)

## 2017-10-30 LAB — C-REACTIVE PROTEIN

## 2017-10-30 LAB — TSH: TSH: 2 u[IU]/mL (ref 0.450–4.500)

## 2017-10-30 LAB — SEDIMENTATION RATE: SED RATE: 7 mm/h (ref 0–32)

## 2017-11-05 ENCOUNTER — Encounter: Payer: Self-pay | Admitting: Family Medicine

## 2017-11-10 ENCOUNTER — Other Ambulatory Visit: Payer: Medicare Other

## 2017-11-30 ENCOUNTER — Telehealth: Payer: Self-pay | Admitting: Family Medicine

## 2017-11-30 NOTE — Telephone Encounter (Signed)
Copied from [facility] [id number]. Topic: Quick Communication - See Telephone Encounter >> Nov 30, 2017 10:30 AM Sheran Luz wrote: Pt called stating that she sent a message through mychart to Dr Brigitte Pulse on 8/1 regarding her lab results and has not heard anything back yet. Pt would like a response through mychart if possible.

## 2017-12-02 ENCOUNTER — Ambulatory Visit: Payer: Medicare Other | Admitting: Family Medicine

## 2017-12-10 ENCOUNTER — Ambulatory Visit: Payer: Self-pay

## 2017-12-10 NOTE — Telephone Encounter (Signed)
Patient called in with c/o "facial pain." She says "this has been going on for a while and I finally got relief with steroids. I was told I needed a referral to [facility] to see a neurologist for trigeminal neuralgia. I was told there are no appointments today in the office and that my appointment to see Dr. Brigitte Pulse on 12/28/17 will be cancelled because she is not back in the office until October. I just want to see someone to get a referral today. My face hurts so bad if I don't get relief, I will jump off a bridge, that's how bad it's hurting. I am not asking for narcotic medications. I have maxxed out on Ibuprofen. Are you able to give me a referral?" I advised there are no availabilities today at [facility] and we do not give referrals at [facility] that she will need to be seen by a provider in order to obtain a referral. She says "I am done with [facility]. I will just go to [facility] emergency room to be seen and they will give me the referral." Patient hung up the phone before I could ask her questions to triage her pain. During the conversation, the patient's voice was loud and she appeared upset.

## 2017-12-13 ENCOUNTER — Encounter (HOSPITAL_COMMUNITY): Payer: Self-pay | Admitting: Emergency Medicine

## 2017-12-13 ENCOUNTER — Emergency Department (HOSPITAL_COMMUNITY)
Admission: EM | Admit: 2017-12-13 | Discharge: 2017-12-13 | Disposition: A | Discharge status: Home / Self Care | Payer: Medicare Other | Attending: Emergency Medicine | Admitting: Emergency Medicine

## 2017-12-13 ENCOUNTER — Other Ambulatory Visit: Payer: Self-pay

## 2017-12-13 DIAGNOSIS — F17210 Nicotine dependence, cigarettes, uncomplicated: Secondary | ICD-10-CM | POA: Insufficient documentation

## 2017-12-13 DIAGNOSIS — Z79899 Other long term (current) drug therapy: Secondary | ICD-10-CM | POA: Diagnosis not present

## 2017-12-13 DIAGNOSIS — K0889 Other specified disorders of teeth and supporting structures: Secondary | ICD-10-CM | POA: Diagnosis not present

## 2017-12-13 DIAGNOSIS — J45909 Unspecified asthma, uncomplicated: Secondary | ICD-10-CM | POA: Diagnosis not present

## 2017-12-13 MED ORDER — CLINDAMYCIN HCL 150 MG PO CAPS: 150.0000 mg | ORAL_CAPSULE | Freq: Three times a day (TID) | ORAL | 0 refills | Status: DC

## 2017-12-13 MED ORDER — CLINDAMYCIN HCL 300 MG PO CAPS: 300.0000 mg | ORAL_CAPSULE | Freq: Three times a day (TID) | ORAL | 0 refills | Status: DC

## 2017-12-13 NOTE — ED Provider Notes (Signed)
[facility] EMERGENCY DEPARTMENT Provider Note   CSN: [id number] Arrival date & time: 12/13/17  2542     History   Chief Complaint Chief Complaint  Patient presents with  . Dental Problem    HPI Catherine Olsen is a 47 y.o. female who presents with dental pain. PMH significant for chronic facial pain, possible trigeminal neuralgia. 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 She has developed issues with what is likely trigeminal neuralgia on the left side of her face and has an appointment at [facility] for evaluation of this. She has also had significant dental work recently and she states that a dentist messed up her filling over one of her upper posterior molars. She has been back to this dentist and was told nothing is wrong with the tooth and that its likely related to her chronic jaw issues. She disagrees and thinks she may be developing an infection or inflamation of the nerve root because she only has pain when she taps on that one tooth. She has been taking Ibuprofen, Tylenol, and Norco for pain. She is requesting antibiotics. She denies fever but feels unwell. She's also had recent nasal surgery.   HPI  Past Medical History:  Diagnosis Date  . Abnormal uterine bleeding (AUB) 06/25/2009   Qualifier: Diagnosis of  By: Cathren Laine MD, Ankit    . Allergy   . Anxiety   . Arthritis    hands, lower back, knees  . Asthma   . Bipolar 1 disorder ([place])   . COPD (chronic obstructive pulmonary disease) ([place])   . Depression   . Endometriosis   . Fibromyalgia   . Headache(784.0)    otc med prn  . Migraine   . Pituitary tumor   . PONV (postoperative nausea and vomiting)   . PTSD (post-traumatic stress disorder)   . Scoliosis   . Termination of pregnancy    x 2 at age 6 and 47 yrs old  . Tobacco abuse 03/04/2015    Patient Active Problem List   Diagnosis Date Noted  . Environmental allergies  10/29/2017  . 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 Chronic migraine without aura without status migrainosus, not intractable 03/24/2017  . Chronic midline low back pain without sciatica 03/24/2017  . Pulmonary emphysema ([place]) 02/19/2017  . Jaw pain 02/11/2017  . Smoking history 12/09/2016  . Exertional dyspnea 12/09/2016  . Family history of alpha 1 antitrypsin deficiency 09/04/2016  . 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 Nodule of left lung 09/04/2016  . Cervical lymphadenopathy 01/29/2016  . Perimenopausal vasomotor symptoms 05/25/2015  . Asthma with acute exacerbation 04/18/2015  . Chronic rhinitis 04/18/2015  . Pituitary microadenoma ([place]) 05/19/2014  . Pituitary abnormality ([place]) 05/19/2014  . Endometriosis of ovary 04/03/2014  . Ovarian cyst, right 04/03/2014  . 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 Posttraumatic stress disorder 09/13/2012  . Fibrocystic breast disease 08/24/2012  . Myalgia and myositis 07/26/2012  . Inflammatory polyarthropathy ([place]) 05/30/2009  . SEXUAL ABUSE, HX OF 01/09/2009  . Bipolar disorder ([place]) 01/17/2008  . INSOMNIA 01/17/2008  . NEVI, MULTIPLE 07/17/2006  . KERATOSIS, SEBORRHEIC [place] 07/17/2006  . ACNE NEC 07/17/2006    Past Surgical History:  Procedure Laterality Date  . BREAST SURGERY    . DILATION AND CURETTAGE OF UTERUS    . FACIAL COSMETIC SURGERY     right cheek  . LAPAROSCOPY Right 11/11/2013   Procedure: LAPAROSCOPY  OPERATIVE with  Drainage of  RIGHT Ovarian ENDOMETRIOMA;  Surgeon: Elveria Royals, MD;  Location: [facility];  Service: Gynecology;  Laterality: Right;  . NOSE SURGERY     rhinoplasty at age 44 yrs  . WISDOM TOOTH EXTRACTION       OB History    Gravida  2   Para      Term      Preterm      AB  2   Living  0     SAB      TAB  2   Ectopic      Multiple      Live Births               Home Medications    Prior to Admission medications   Medication Sig Start Date End  Date Taking? Authorizing Provider  albuterol (VENTOLIN HFA) 108 (90 Base) MCG/ACT inhaler INHALE 2 PUFFS INTO THE LUNGS EVERY 6 HOURS AS NEEDED FOR WHEEZING OR SHORTNESS OF BREATH 10/29/17   Shawnee Knapp, MD  alprazolam Duanne Moron) 2 MG tablet TK 1 T PO BID PRF SEVERE ANXIETY/AGITATION 03/05/17   [provider]  estradiol (CLIMARA - DOSED IN MG/24 HR) 0.025 mg/24hr patch APP 1 PA EXT TO THE SKIN Q 3 DAYS 03/10/17   [provider]  HYDROcodone-acetaminophen (NORCO) 5-325 MG tablet Take 1 tablet by mouth every 6 (six) hours as needed. 10/29/17   Shawnee Knapp, MD  ibuprofen (ADVIL,MOTRIN) 800 MG tablet Take 1 tablet (800 mg total) by mouth every 8 (eight) hours as needed. 03/18/17   Wardell Honour, MD  nicotine (NICODERM CQ - DOSED IN MG/24 HOURS) 21 mg/24hr patch Place 21 mg onto the skin See admin instructions. 10/21/17   [provider]  progesterone (PROMETRIUM) 200 MG capsule TK ONE C PO  ONCE A DAY X 10 DAYS 03/09/17   [provider]    Family History Family History  Problem Relation Age of Onset  . Diabetes Mother   . Hypertension Mother   . Stroke Mother 95       CVA  . Mental illness Mother        no diagnosis; personality disorder  . Hyperlipidemia Father   . Hypertension Father   . COPD Father   . Alpha-1 antitrypsin deficiency Father   . Alpha-1 antitrypsin deficiency Brother   . Diabetes Maternal Grandmother   . Heart disease Maternal Grandmother   . Hyperlipidemia Maternal Grandmother   . Hypertension Maternal Grandmother   . Mental illness Maternal Grandmother   . Heart disease Maternal Grandfather   . Hyperlipidemia Maternal Grandfather   . Hypertension Maternal Grandfather   . Heart disease Paternal Grandfather   . Hyperlipidemia Paternal Grandfather   . Hypertension Paternal Grandfather   . Stroke Paternal Grandfather   . Alzheimer's disease Paternal Grandmother     Social History Social History   Tobacco Use  . Smoking status:  Current Some Day Smoker    Packs/day: 0.50    Years: 29.00    Pack years: 14.50    Types: Cigarettes, E-cigarettes  . Smokeless tobacco: Never Used  Substance Use Topics  . Alcohol use: Not Currently    Comment: "  . Drug use: Yes    Types: "Crack" cocaine, Cocaine    Comment: Heavy user per patient, last use Tuesday 11/08/13  LAST CRACK  2015     Allergies   Sulfamethoxazole; Oxycodone; Abilify [aripiprazole]; Penicillins; Other; Percocet [oxycodone-acetaminophen];  Sulfa antibiotics; and Sulfites   Review of Systems Review of Systems  Constitutional: Negative for fever.  HENT: Positive for dental problem.      Physical Exam Updated Vital Signs BP (!) 150/92 (BP Location: Right Arm)   Pulse 69   Temp 97.7 F (36.5 C) (Oral)   Resp 17   LMP 11/21/2014 Comment: Pt. states she is having bleeding today and has been for 9 days.  SpO2 100%   Physical Exam  Constitutional: She is oriented to person, place, and time. She appears well-developed and well-nourished. No distress.  Animated but cooperative  HENT:  Head: Normocephalic and atraumatic.  Mouth/Throat: Uvula is midline, oropharynx is clear and moist and mucous membranes are normal. No trismus in the jaw. No dental abscesses.    Mild right facial swelling. Nasal bone appears bruised from recent surgery  Eyes: Pupils are equal, round, and reactive to light. Conjunctivae are normal. Right eye exhibits no discharge. Left eye exhibits no discharge. No scleral icterus.  Neck: Normal range of motion.  Cardiovascular: Normal rate.  Pulmonary/Chest: Effort normal. No respiratory distress.  Abdominal: She exhibits no distension.  Neurological: She is alert and oriented to person, place, and time.  Skin: Skin is warm and dry.  Psychiatric: She has a normal mood and affect. Her behavior is normal.  Nursing note and vitals reviewed.    ED Treatments / Results  Labs (all labs ordered are listed, but only abnormal results are  displayed) Labs Reviewed - No data to display  EKG None  Radiology No results found.  Procedures Procedures (including critical care time)  Medications Ordered in ED Medications - No data to display   Initial Impression / Assessment and Plan / ED Course  I have reviewed the triage vital signs and the nursing notes.  Pertinent labs & imaging results that were available during my care of the patient were reviewed by me and considered in my medical decision making (see chart for details).  47 year old with dental pain. Patient is afebrile, non toxic appearing, and swallowing secretions well. No concerning findings on exam. No obvious abscess and doubt deep space head or neck infection. She is requesting antibiotics. Although there are no obvious signs of infection this is worth trying. She was given a referral to a dentist. She is not requesting pain management as she already had OTC meds and Norco. Encouraged f/u.  Final Clinical Impressions(s) / ED Diagnoses   Final diagnoses:  Pain, dental    ED Discharge Orders         Ordered    clindamycin (CLEOCIN) 150 MG capsule  3 times daily,   Status:  Discontinued     12/13/17 0754    clindamycin (CLEOCIN) 300 MG capsule  3 times daily     12/13/17 0756           Recardo Evangelist, PA-C 12/13/17 Frederich Balding    Isla Pence, MD 12/13/17 1013

## 2017-12-13 NOTE — Discharge Instructions (Signed)
Take Clindamycin three times daily for the next 10 days Please follow up with a dentist

## 2017-12-13 NOTE — ED Triage Notes (Signed)
Pt. Stated, Donnald Garre had a dental problem with my tooth and Ive had nerve problem. Ive also seen a maxilla facial doctor. My tooth hurts even if press my tongue on it.

## 2017-12-14 ENCOUNTER — Telehealth: Payer: Self-pay | Admitting: Family Medicine

## 2017-12-14 ENCOUNTER — Telehealth: Payer: Self-pay | Admitting: Neurology

## 2017-12-14 NOTE — Telephone Encounter (Signed)
Patient will be calling to set her MRI up, it was ordered a few months ago but she does have some questions for you regarding the MRI. Please Call. Thanks

## 2017-12-14 NOTE — Telephone Encounter (Signed)
Called Pt, she was in another physicians appt, she will call me back

## 2017-12-14 NOTE — Telephone Encounter (Signed)
Called pt and rescheduled her appt with Dr. Brigitte Pulse from 12/28/17 to 01/06/18 at 4:00 pm.   Dr. Nolon Rod, Can you please advise this pt on her lab results? When speaking with her today, she is HIGHLY angry with [place] for not responding to her request that she sent in on 11/05/17 regarding her lab results. She is needing someone (a M.D.) to explain her results to her. She stated that she has recently been diagnosed with a new disease and is wanting to know about her results. I spoke with Nicki Reaper and he advised that I maybe send to you.  Below, you will find the original message from the pt sent 11/05/17.Thank you for any help you may be able to give her.     I just looked over my blood test results. I don't understand the ANA test results. I thought from my values that my results are okay, but when I read the comments section with all the percentages, I became puzzled. Do my ANA blood tests show that I may have an autoimmune disease or not?   As far as my my EOS absolute and my RDW tests, I assume I may be anemic, which wouldn't totally surprise me, because I haven't eaten meat or seafood in thirty years, and only very rarely consume any sort of dairy or egg based product. I do though take a vitamin a couple times a week, so I am a little bit caught off guard. How badly anemic am I?   Thanks,  Catherine Olsen

## 2017-12-15 NOTE — Telephone Encounter (Signed)
Spoke with patient. Answered all questions.

## 2017-12-16 ENCOUNTER — Telehealth: Payer: Self-pay | Admitting: Neurology

## 2017-12-16 NOTE — Telephone Encounter (Signed)
Called and spoke with Pt. Advised her to contact the physician who diagnosed her with trigeminal neurology. Pt was very upset, she threatened that Dr Tomi Likens would be sorry "he will be f-ing sorry" and wanted to make sure he knew it "he's a lousy bastard" and several other derogatory comments. Pt disconnected the call. I had the phone on speaker due to the earlier conversation. The practice administrator, Vonzell Schlatter, co-workers Caryl Pina Driver, LPN, Lucrezia Starch, CMA, Laureen Ochs, R EEG T were present and heard the entire conversation.

## 2017-12-16 NOTE — Telephone Encounter (Signed)
Catherine Olsen will be dismissed from [facility] for inappropriate language and profanity.

## 2017-12-16 NOTE — Telephone Encounter (Signed)
If she saw another specialist who diagnosed her with trigeminal neuralgia, then I recommend he/she treat her for trigeminal neuralgia, since that is his/her diagnosis.  They should be the one to order another MRI.

## 2017-12-16 NOTE — Telephone Encounter (Signed)
Patient lmom needing to speak with you regarding the pain she is in and that "she could jump off a bridge". She is needing changes made to her MRI order regarding her Trigeminal Neuralgia. She said in order to see a  Specialist for the Trigeminal Neuralgia they are specifically requiring Disc summary sent to them. Please Call. Thanks

## 2017-12-16 NOTE — Telephone Encounter (Signed)
Called and spoke with Pt. She has been to see another Dr and was told she absolutely has trigeminal neuralgia. She is demanding an addendum to her MRI to include attention to trigeminal nerve, she will not accept no for an answer, she will come to the office and not leave until she can see the Dr and make her case. She wants to be worked in as an urgent OV, she is in extreme pain. I explained to her we do not have any openings, nor do we take walk-ins, but that I will talk to the physician and call her back. Pt insists the call back be ASAP. I did advise her we are seeing Pt's and will call her back as soon as I have an answer. She states the answer can not be anything other than yes.   Of note, when I returned Pt's call yesterday, she answered the phone to say she would have to call back, she was having a biopsy at that very moment. She also called the after hours number twice and has called twice to our office this morning.

## 2017-12-17 ENCOUNTER — Telehealth: Payer: Self-pay | Admitting: Neurology

## 2017-12-17 ENCOUNTER — Other Ambulatory Visit: Payer: Self-pay | Admitting: General Surgery

## 2017-12-17 ENCOUNTER — Encounter: Payer: Self-pay | Admitting: Neurology

## 2017-12-17 DIAGNOSIS — N6320 Unspecified lump in the left breast, unspecified quadrant: Secondary | ICD-10-CM

## 2017-12-17 NOTE — Telephone Encounter (Signed)
Patient called the office in 12/16/17 and demanded to speak to Dr. Georgie Chard Medical Director. The front desk staff forwarded the call to me. I spoke with the patient regarding her concerns. She was very upset from the tone of her voice. She insisted that Dr. Tomi Likens order a specific type of MRI based on a recommendation from her oral surgeon. I explained to the patient that he was not able to do that since he has not diagnosed her trigeminal neuralgia. She became increasingly upset and began cursing and demanding that he order this MRI for her. I verbalized to the patient that I understand her frustration however if the provider has not diagnosed her with or documented in his notes that she has or may have trigeminal neuralgia that we could not order the MRI and that insurance would more than likely deny the MRI if there is no documentation to support ordering the MRI for trigeminal neuralgia. Per my conversation with Dr. Tomi Likens he did offer her an appointment however patient was insistent on being seen same day. Patient then stated several times that if he Tomi Likens) did not order this MRI for her that he would be very sorry. She called him a dumb bastard using expletive language during the my conversation with her. I ask the patient to clarify if she was threatening the provider. She stated not physically. Patient continued to get more and more angry and then ended the call by hanging up the phone on me. During the course of the phone conversation I offered multiple times to give the patient the phone number to Compliance and Patient Safety which she declined by stating "that was useless".

## 2017-12-17 NOTE — Telephone Encounter (Signed)
Notes recorded by Rutherford Guys, MD on 12/15/2017 at 1:07 PM EDT Normal labs Discussed with patient via phone

## 2017-12-18 ENCOUNTER — Telehealth: Payer: Self-pay | Admitting: Family Medicine

## 2017-12-18 ENCOUNTER — Telehealth: Payer: Self-pay | Admitting: Neurology

## 2017-12-18 NOTE — Telephone Encounter (Signed)
Dismissed from [facility] by Dr Tomi Likens effective 12/17/17. Dismissal Letter sent out 1st class mail   LM

## 2017-12-18 NOTE — Telephone Encounter (Signed)
Left a VM in regards to her appt she has with Dr. Brigitte Pulse on 01/06/2018. The provider is extending her leave of absence and will be back in Nov.

## 2017-12-21 ENCOUNTER — Telehealth: Payer: Self-pay | Admitting: Family Medicine

## 2017-12-21 NOTE — Telephone Encounter (Signed)
Copied from [place] [id number]. Topic: Appointment Scheduling - Scheduling Inquiry for Clinic >> Dec 21, 2017  2:58 PM Sheran Luz wrote: Reason for CRM: Pt calling to try to sched an appointment but prefers to see Dr. Pamella Pert because her PCP [name] is on leave. Pt would like to know if Dr. Pamella Pert would be willing to work her in tomorrow morning, as pt states she thinks that she has an infection. Please advise.

## 2017-12-22 ENCOUNTER — Ambulatory Visit (HOSPITAL_COMMUNITY)
Admission: EM | Admit: 2017-12-22 | Discharge: 2017-12-22 | Disposition: A | Discharge status: Home / Self Care | Payer: Medicare Other | Attending: Family Medicine | Admitting: Family Medicine

## 2017-12-22 ENCOUNTER — Encounter (HOSPITAL_COMMUNITY): Payer: Self-pay | Admitting: Emergency Medicine

## 2017-12-22 ENCOUNTER — Ambulatory Visit: Payer: Medicare Other | Admitting: Pediatrics

## 2017-12-22 ENCOUNTER — Other Ambulatory Visit: Payer: Self-pay

## 2017-12-22 DIAGNOSIS — J0100 Acute maxillary sinusitis, unspecified: Secondary | ICD-10-CM | POA: Diagnosis not present

## 2017-12-22 MED ORDER — DOXYCYCLINE HYCLATE 100 MG PO CAPS: 100.0000 mg | ORAL_CAPSULE | Freq: Two times a day (BID) | ORAL | 0 refills | Status: DC

## 2017-12-22 NOTE — Discharge Instructions (Signed)
It was nice meeting you!!  We will go ahead and try the doxycycline to see if this helps.  Follow up with ENT if not better in the next week.

## 2017-12-22 NOTE — Telephone Encounter (Signed)
Pt called to check if she can be fitted in the schedule today; pt is seeking relief from her infection; contact to advise

## 2017-12-22 NOTE — ED Triage Notes (Signed)
The patient presented to the [facility] with a complaint of an "infection" secondary to recurring dental pain and post sinus surgery.
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
## 2017-12-23 NOTE — Telephone Encounter (Signed)
Please schedule this patient with whom ever available.  thanks

## 2017-12-23 NOTE — Telephone Encounter (Signed)
Please see note below. 

## 2017-12-28 ENCOUNTER — Ambulatory Visit: Payer: Medicare Other | Admitting: Family Medicine

## 2017-12-29 ENCOUNTER — Telehealth: Payer: Self-pay | Admitting: Family Medicine

## 2017-12-29 NOTE — Telephone Encounter (Signed)
Called pt to try and reschedule her appt with Dr. Brigitte Pulse on 10/02. Patient stated that she is working with another doctor and is going to try and make then her PCP. If it doesn't work out then she will call us back.

## 2017-12-31 ENCOUNTER — Encounter (HOSPITAL_COMMUNITY): Payer: Self-pay | Admitting: *Deleted

## 2017-12-31 ENCOUNTER — Emergency Department (HOSPITAL_COMMUNITY)
Admission: EM | Admit: 2017-12-31 | Discharge: 2017-12-31 | Disposition: A | Discharge status: Home / Self Care | Payer: Medicare Other | Attending: Emergency Medicine | Admitting: Emergency Medicine

## 2017-12-31 ENCOUNTER — Emergency Department (HOSPITAL_COMMUNITY): Payer: Medicare Other

## 2017-12-31 ENCOUNTER — Other Ambulatory Visit: Payer: Self-pay

## 2017-12-31 DIAGNOSIS — F17210 Nicotine dependence, cigarettes, uncomplicated: Secondary | ICD-10-CM | POA: Insufficient documentation

## 2017-12-31 DIAGNOSIS — R600 Localized edema: Secondary | ICD-10-CM | POA: Diagnosis not present

## 2017-12-31 DIAGNOSIS — R51 Headache: Secondary | ICD-10-CM | POA: Insufficient documentation

## 2017-12-31 DIAGNOSIS — J449 Chronic obstructive pulmonary disease, unspecified: Secondary | ICD-10-CM | POA: Insufficient documentation

## 2017-12-31 DIAGNOSIS — R519 Headache, unspecified: Secondary | ICD-10-CM

## 2017-12-31 LAB — CBC
HCT: 44.8 % (ref 36.0–46.0)
Hemoglobin: 14.9 g/dL (ref 12.0–15.0)
MCH: 29.7 pg (ref 26.0–34.0)
MCHC: 33.3 g/dL (ref 30.0–36.0)
MCV: 89.2 fL (ref 78.0–100.0)
PLATELETS: 264 10*3/uL (ref 150–400)
RBC: 5.02 MIL/uL (ref 3.87–5.11)
RDW: 17.4 % — AB (ref 11.5–15.5)
WBC: 6.2 10*3/uL (ref 4.0–10.5)

## 2017-12-31 LAB — COMPREHENSIVE METABOLIC PANEL
ALT: 15 U/L (ref 0–44)
AST: 19 U/L (ref 15–41)
Albumin: 4.4 g/dL (ref 3.5–5.0)
Alkaline Phosphatase: 53 U/L (ref 38–126)
Anion gap: 12 (ref 5–15)
CO2: 24 mmol/L (ref 22–32)
CREATININE: 0.7 mg/dL (ref 0.44–1.00)
Calcium: 9 mg/dL (ref 8.9–10.3)
Chloride: 99 mmol/L (ref 98–111)
GFR calc non Af Amer: >60 mL/min (ref >60)
GLUCOSE: 86 mg/dL (ref 70–99)
Potassium: 3.4 mmol/L — ABNORMAL LOW (ref 3.5–5.1)
Sodium: 135 mmol/L (ref 135–145)
Total Bilirubin: 0.8 mg/dL (ref 0.3–1.2)
Total Protein: 6.6 g/dL (ref 6.5–8.1)

## 2017-12-31 MED ORDER — IOPAMIDOL (ISOVUE-370) INJECTION 76%: 75.0000 mL | Freq: Once | INTRAVENOUS | Status: DC | PRN

## 2017-12-31 MED ORDER — PREDNISONE 10 MG PO TABS: ORAL_TABLET | ORAL | 0 refills | Status: DC

## 2017-12-31 MED ORDER — IOHEXOL 300 MG/ML  SOLN
75.0000 mL | Freq: Once | INTRAMUSCULAR | Status: AC | PRN
  Administered 2017-12-31: 75 mL via INTRAVENOUS

## 2017-12-31 NOTE — ED Notes (Signed)
Pt at nurses station again requesting CD- pt again made aware that the CD has been requested. Pt states she can no longer wait.

## 2017-12-31 NOTE — ED Provider Notes (Signed)
[facility] Emergency Department Provider Note MRN:  [id number]  Arrival date & time: 12/31/17     Chief Complaint   Facial Swelling   History of Present Illness   Catherine Olsen is a 47 y.o. year-old female with a history of bipolar disorder, COPD presenting to the ED with chief complaint of facial pain and swelling.  Patient explains she had a Gore-Tex implant placed on the left side of her face 20 years ago.  Has had some more recent swelling to the left side of her face, associated with moderate severity sharp pain intermittently.  Was put on a clindamycin antibiotic course, which resolved the symptoms.  After completing the course, the symptoms returned.  Has recently been on doxycycline, which has not helped.  Patient denies fevers, no chest pain or shortness of breath, no abdominal pain.  Has been evaluated by multiple dentist, who told her that this pain and swelling is not related to her teeth.  Patient is worried that her prior implant is infected.  Review of Systems  A complete 10 system review of systems was obtained and all systems are negative except as noted in the HPI and PMH.   Patient's Health History    Past Medical History:  Diagnosis Date  . Abnormal uterine bleeding (AUB) 06/25/2009   Qualifier: Diagnosis of  By: Cathren Laine MD, Ankit    . Allergy   . Anxiety   . Arthritis    hands, lower back, knees  . Asthma   . Bipolar 1 disorder ([place])   . COPD (chronic obstructive pulmonary disease) ([place])   . Depression   . Endometriosis   . Fibromyalgia   . Headache(784.0)    otc med prn  . Migraine   . Pituitary tumor   . PONV (postoperative nausea and vomiting)   . PTSD (post-traumatic stress disorder)   . Scoliosis   . Termination of pregnancy    x 2 at age 46 and 47 yrs old  . Tobacco abuse 03/04/2015    Past Surgical History:  Procedure Laterality Date  . BREAST SURGERY    . DILATION AND CURETTAGE OF UTERUS    . FACIAL COSMETIC SURGERY     right  cheek  . LAPAROSCOPY Right 11/11/2013   Procedure: LAPAROSCOPY OPERATIVE with  Drainage of  RIGHT Ovarian ENDOMETRIOMA;  Surgeon: Elveria Royals, MD;  Location: [facility];  Service: Gynecology;  Laterality: Right;  . NOSE SURGERY     rhinoplasty at age 68 yrs  . WISDOM TOOTH EXTRACTION      Family History  Problem Relation Age of Onset  . Diabetes Mother   . Hypertension Mother   . Stroke Mother 5       CVA  . Mental illness Mother        no diagnosis; personality disorder  . Hyperlipidemia Father   . Hypertension Father   . COPD Father   . Alpha-1 antitrypsin deficiency Father   . Alpha-1 antitrypsin deficiency Brother   . Diabetes Maternal Grandmother   . Heart disease Maternal Grandmother   . Hyperlipidemia Maternal Grandmother   . Hypertension Maternal Grandmother   . Mental illness Maternal Grandmother   . Heart disease Maternal Grandfather   . Hyperlipidemia Maternal Grandfather   . Hypertension Maternal Grandfather   . Heart disease Paternal Grandfather   . Hyperlipidemia Paternal Grandfather   . Hypertension Paternal Grandfather   . Stroke Paternal Grandfather   . Alzheimer's disease Paternal Grandmother  Social History   Socioeconomic History  . Marital status: Divorced    Spouse name: Not on file  . Number of children: 0  . Years of education: Not on file  . Highest education level: Bachelor's degree (e.g., BA, AB, BS)  Occupational History  . Occupation: disability    Comment: mental illness  Social Needs  . Financial resource strain: Not on file  . Food insecurity:    Worry: Not on file    Inability: Not on file  . Transportation needs:    Medical: Not on file    Non-medical: Not on file  Tobacco Use  . Smoking status: Current Some Day Smoker    Packs/day: 0.50    Years: 29.00    Pack years: 14.50    Types: Cigarettes, E-cigarettes  . Smokeless tobacco: Never Used  Substance and Sexual Activity  . Alcohol use: Not Currently    Comment: "  .  Drug use: Yes    Types: "Crack" cocaine, Cocaine    Comment: Heavy user per patient, last use Tuesday 11/08/13  LAST CRACK  2015  . Sexual activity: Not Currently    Comment: abortion at 48 and 16yrs. of age   Lifestyle  . Physical activity:    Days per week: Not on file    Minutes per session: Not on file  . Stress: Not on file  Relationships  . Social connections:    Talks on phone: Not on file    Gets together: Not on file    Attends religious service: Not on file    Active member of club or organization: Not on file    Attends meetings of clubs or organizations: Not on file    Relationship status: Not on file  . Intimate partner violence:    Fear of current or ex partner: Not on file    Emotionally abused: Not on file    Physically abused: Not on file    Forced sexual activity: Not on file  Other Topics Concern  . Not on file  Social History Narrative   Marital status: divorced; not dating      Children: none      Lives: alone with mom      Employment: disability for mental illness in 1997.  Hospitalizations x 9 in past.      Tobacco: 1 ppd x since age 37. Decreasing in 2018.      Alcohol: socially; beers.      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      Exercise: yoga daily; exercise daily.      ADLs: independent with ADLs; no car; depends on others for transportation.      Patient is right-handed. She is divorced and lives with her mother. She drinks 2-3 cups of 1/2 caffeine coffe a day. She walks occasionally for exercise.     Physical Exam  Vital Signs and Nursing Notes reviewed Vitals:   12/31/17 0701 12/31/17 0740  BP: (!) 142/103 (!) 122/107  Pulse: 92 87  Resp: 16 16  SpO2: 100% 100%    CONSTITUTIONAL: Well-appearing, NAD NEURO:  Alert and oriented x 3, no focal deficits EYES:  eyes equal and reactive ENT/NECK:  no LAD, no JVD, mild left facial swelling of the zygomatic region CARDIO: Regular rate, well-perfused, normal S1 and  S2 PULM:  CTAB no wheezing or rhonchi GI/GU:  normal bowel sounds, non-distended, non-tender MSK/SPINE:  No gross deformities, no edema SKIN:  no rash,  atraumatic PSYCH: Anxious speech and behavior  Diagnostic and Interventional Summary    EKG Interpretation  Date/Time:    Ventricular Rate:    PR Interval:    QRS Duration:   QT Interval:    QTC Calculation:   R Axis:     Text Interpretation:        Labs Reviewed  CBC - Abnormal; Notable for the following components:      Result Value   RDW 17.4 (*)    All other components within normal limits  COMPREHENSIVE METABOLIC PANEL - Abnormal; Notable for the following components:   Potassium 3.4 (*)    BUN <5 (*)    All other components within normal limits    CT Maxillofacial W Contrast  Final Result      Medications  iopamidol (ISOVUE-370) 76 % injection 75 mL (has no administration in time range)  iohexol (OMNIPAQUE) 300 MG/ML solution 75 mL (75 mLs Intravenous Contrast Given 12/31/17 1046)     Procedures Critical Care  ED Course and Medical Decision Making  I have reviewed the triage vital signs and the nursing notes.  Pertinent labs & imaging results that were available during my care of the patient were reviewed by me and considered in my medical decision making (see below for details).  Asymmetric swelling of the face, reported history of surgery in the past with Gore-Tex implant.  Will CT to exclude underlying abscess or infection.  CT with no acute process, no evidence of infection.  Patient explains that short course of steroids has helped her swelling in the past.  Prescription provided, will follow-up with PCP.  After the discussed management above, the patient was determined to be safe for discharge.  The patient was in agreement with this plan and all questions regarding their care were answered.  ED return precautions were discussed and the patient will return to the ED with any significant worsening of  condition.  Barth Kirks. Sedonia Small, [place] [email or web address]  Final Clinical Impressions(s) / ED Diagnoses     ICD-10-CM   1. Facial pain R51     ED Discharge Orders         Ordered    predniSONE (DELTASONE) 10 MG tablet     12/31/17 1206             Maudie Flakes, MD 12/31/17 1208

## 2017-12-31 NOTE — ED Notes (Signed)
Patient verbalizes understanding of discharge instructions. Opportunity for questioning and answers were provided. Armband removed by staff, pt discharged from ED.  

## 2017-12-31 NOTE — ED Notes (Signed)
Ct scan to nurses station need pts creatinine level results prior to scan.

## 2017-12-31 NOTE — Discharge Instructions (Addendum)
You were evaluated in the Emergency Department and after careful evaluation, we did not find any emergent condition requiring admission or further testing in the hospital. ° °Please return to the Emergency Department if you experience any worsening of your condition.  We encourage you to follow up with a primary care provider.  Thank you for allowing us to be a part of your care. °

## 2017-12-31 NOTE — ED Triage Notes (Signed)
Pt finished her doxycycline, just started her keflex on Saturday. Pt has been seeing a specialist at [facility] for trigeminal myalgia

## 2017-12-31 NOTE — ED Notes (Signed)
Pt at nurses station demanding copy of CT results on CD- pt made aware that request has been put in.

## 2017-12-31 NOTE — ED Notes (Signed)
Pt returns from ct scan. 

## 2017-12-31 NOTE — ED Triage Notes (Signed)
Pt concerned for infection in face. Pt reports having a facial implant 20+ years ago, has had several crowns placed since then with the last one in February.. Pt feels that her face is swollen, has had chills at home. Concerned that she has a further infection in face that has caused the swelling. Pt was seen at [facility] and had her PCP order a CT scan which she hasn't had done yet.

## 2018-01-02 ENCOUNTER — Encounter: Payer: Self-pay | Admitting: Family Medicine

## 2018-01-02 DIAGNOSIS — G500 Trigeminal neuralgia: Secondary | ICD-10-CM | POA: Insufficient documentation

## 2018-01-02 DIAGNOSIS — R9431 Abnormal electrocardiogram [ECG] [EKG]: Secondary | ICD-10-CM | POA: Insufficient documentation

## 2018-01-02 MED ORDER — IBUPROFEN 800 MG PO TABS: 800.0000 mg | ORAL_TABLET | Freq: Three times a day (TID) | ORAL | 2 refills | Status: DC | PRN

## 2018-01-02 NOTE — Telephone Encounter (Signed)
Dr. Pamella Pert discussed lab results with pt by phone.  Not anemic.

## 2018-01-06 ENCOUNTER — Ambulatory Visit: Payer: Medicare Other | Admitting: Family Medicine

## 2018-01-12 ENCOUNTER — Other Ambulatory Visit: Payer: Self-pay | Admitting: General Surgery

## 2018-01-12 ENCOUNTER — Other Ambulatory Visit: Payer: Self-pay

## 2018-01-12 DIAGNOSIS — N6320 Unspecified lump in the left breast, unspecified quadrant: Secondary | ICD-10-CM

## 2018-01-13 ENCOUNTER — Other Ambulatory Visit: Payer: Self-pay | Admitting: Family Medicine

## 2018-01-14 ENCOUNTER — Ambulatory Visit
Admission: RE | Admit: 2018-01-14 | Discharge: 2018-01-14 | Disposition: A | Discharge status: Home / Self Care | Payer: Medicare Other | Source: Ambulatory Visit | Attending: General Surgery | Admitting: General Surgery

## 2018-01-14 DIAGNOSIS — N6320 Unspecified lump in the left breast, unspecified quadrant: Secondary | ICD-10-CM

## 2018-01-15 ENCOUNTER — Other Ambulatory Visit: Payer: Medicare Other

## 2018-01-20 ENCOUNTER — Encounter: Payer: Self-pay | Admitting: Emergency Medicine

## 2018-01-20 ENCOUNTER — Other Ambulatory Visit: Payer: Self-pay

## 2018-01-20 ENCOUNTER — Ambulatory Visit (INDEPENDENT_AMBULATORY_CARE_PROVIDER_SITE_OTHER): Payer: Medicare Other | Admitting: Emergency Medicine

## 2018-01-20 ENCOUNTER — Telehealth: Payer: Self-pay

## 2018-01-20 VITALS — BP 114/72 | HR 80 | Temp 97.5°F | Ht 67.8 in | Wt 139.4 lb

## 2018-01-20 DIAGNOSIS — R51 Headache: Secondary | ICD-10-CM

## 2018-01-20 DIAGNOSIS — R220 Localized swelling, mass and lump, head: Secondary | ICD-10-CM | POA: Insufficient documentation

## 2018-01-20 DIAGNOSIS — G8929 Other chronic pain: Secondary | ICD-10-CM | POA: Insufficient documentation

## 2018-01-20 DIAGNOSIS — R519 Headache, unspecified: Secondary | ICD-10-CM | POA: Insufficient documentation

## 2018-01-20 DIAGNOSIS — K089 Disorder of teeth and supporting structures, unspecified: Secondary | ICD-10-CM | POA: Diagnosis not present

## 2018-01-20 MED ORDER — DICLOFENAC SODIUM 75 MG PO TBEC: 75.0000 mg | DELAYED_RELEASE_TABLET | Freq: Two times a day (BID) | ORAL | 0 refills | Status: AC
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
## 2018-01-20 NOTE — Patient Instructions (Addendum)
If you have lab work done today you will be contacted with your lab results within the next 2 weeks.  If you have not heard from [place] then please contact us. The fastest way to get your results is to register for My Chart.   IF you received an x-ray today, you will receive an invoice from [facility]. Please contact [facility] at [phone] with questions or concerns regarding your invoice.   IF you received labwork today, you will receive an invoice from [place]. Please contact [facility] at [phone] with questions or concerns regarding your invoice.   Our billing staff will not be able to assist you with questions regarding bills from these companies.  You will be contacted with the lab results as soon as they are available. The fastest way to get your results is to activate your My Chart account. Instructions are located on the last page of this paperwork. If you have not heard from [place] regarding the results in 2 weeks, please contact this office.      Dental Pain Dental pain may be caused by many things, including:  Tooth decay (cavities or caries). Cavities cause the nerve of your tooth to be open to air and hot or cold temperatures. This can cause pain or discomfort.  Abscess or infection. A dental abscess is an area that is full of infected pus from a bacterial infection in the inner part of the tooth (pulp). It usually happens at the end of the tooth's root.  Injury.  An unknown reason (idiopathic).  Your pain may be mild or severe. It may only happen when:  You are chewing.  You are exposed to hot or cold temperature.  You are eating or drinking sugary foods or beverages, such as: ? Soda. ? Candy.  Your pain may also be there all of the time. Follow these instructions at home: Watch your dental pain for any changes. Do these things to lessen your discomfort:  Take medicines only as told by your dentist.  If your dentist tells you to take  an antibiotic medicine, finish all of it even if you start to feel better.  Keep all follow-up visits as told by your dentist. This is important.  Do not apply heat to the outside of your face.  Rinse your mouth or gargle with salt water if told by your dentist. This helps with pain and swelling. ? You can make salt water by adding  tsp of salt to 1 cup of warm water.  Apply ice to the painful area of your face: ? Put ice in a plastic bag. ? Place a towel between your skin and the bag. ? Leave the ice on for 20 minutes, 2-3 times per day.  Avoid foods or drinks that cause you pain, such as: ? Very hot or very cold foods or drinks. ? Sweet or sugary foods or drinks.  Contact a doctor if:  Your pain is not helped with medicines.  Your symptoms are worse.  You have new symptoms. Get help right away if:  You cannot open your mouth.  You are having trouble breathing or swallowing.  You have a fever.  Your face, neck, or jaw is puffy (swollen). This information is not intended to replace advice given to you by your health care provider. Make sure you discuss any questions you have with your health care provider. Document Released: 09/10/2007 Document Revised: 08/30/2015 Document Reviewed: 03/20/2014 Elsevier Interactive Patient Education  2018  [employer].

## 2018-01-20 NOTE — Telephone Encounter (Signed)
Pt req referral be made for provider that takes her Medicare / Medicaid as she is disabled and has very limited income.   Message sent to referrals

## 2018-01-21 ENCOUNTER — Telehealth: Payer: Self-pay | Admitting: Emergency Medicine

## 2018-01-21 DIAGNOSIS — R220 Localized swelling, mass and lump, head: Secondary | ICD-10-CM

## 2018-01-21 NOTE — Telephone Encounter (Signed)
She was sent to [facility].. They accept her insurance.

## 2018-01-21 NOTE — Telephone Encounter (Signed)
PER [facility] THE ORDER FOR THE MRI NEEDS TO STATE WITH AND WITHOUT CONTRAST   THANK YOU

## 2018-01-21 NOTE — Telephone Encounter (Signed)
Please advise 

## 2018-01-25 ENCOUNTER — Encounter: Payer: Self-pay | Admitting: Emergency Medicine

## 2018-01-25 NOTE — Telephone Encounter (Signed)
Please look into this

## 2018-01-25 NOTE — Telephone Encounter (Signed)
New order placed

## 2018-01-25 NOTE — Telephone Encounter (Signed)
Order was received

## 2018-01-26 ENCOUNTER — Telehealth: Payer: Self-pay | Admitting: Emergency Medicine

## 2018-01-26 ENCOUNTER — Encounter: Payer: Self-pay | Admitting: Emergency Medicine

## 2018-01-26 NOTE — Telephone Encounter (Signed)
Pt does not prefer [facility] for imaging needs.

## 2018-01-26 NOTE — Telephone Encounter (Signed)
Pt was scheduled with [facility] - pt wrote mychart message she did not want [facility], she wanted MRI done at [facility].  Referrals have sent to [facility].

## 2018-01-26 NOTE — Telephone Encounter (Signed)
Pt wanted to get mri done at [facility] pt mri order to [facility] and called them they will be calling pt tomorrow to sch advise pt of this and they pt was very pleased.

## 2018-02-02 ENCOUNTER — Encounter: Payer: Self-pay | Admitting: Pediatrics

## 2018-02-02 ENCOUNTER — Ambulatory Visit (INDEPENDENT_AMBULATORY_CARE_PROVIDER_SITE_OTHER): Payer: Medicare Other | Admitting: Pediatrics

## 2018-02-02 VITALS — BP 118/76 | HR 67 | Temp 98.0°F | Resp 20 | Ht 67.5 in | Wt 140.6 lb

## 2018-02-02 DIAGNOSIS — J3089 Other allergic rhinitis: Secondary | ICD-10-CM

## 2018-02-02 DIAGNOSIS — J4520 Mild intermittent asthma, uncomplicated: Secondary | ICD-10-CM

## 2018-02-02 DIAGNOSIS — T783XXD Angioneurotic edema, subsequent encounter: Secondary | ICD-10-CM

## 2018-02-02 DIAGNOSIS — T7840XD Allergy, unspecified, subsequent encounter: Secondary | ICD-10-CM | POA: Insufficient documentation

## 2018-02-02 DIAGNOSIS — L503 Dermatographic urticaria: Secondary | ICD-10-CM

## 2018-02-02 DIAGNOSIS — T783XXA Angioneurotic edema, initial encounter: Secondary | ICD-10-CM | POA: Insufficient documentation

## 2018-02-02 DIAGNOSIS — Z9689 Presence of other specified functional implants: Secondary | ICD-10-CM

## 2018-02-02 DIAGNOSIS — K219 Gastro-esophageal reflux disease without esophagitis: Secondary | ICD-10-CM

## 2018-02-02 MED ORDER — FAMOTIDINE 20 MG PO TABS: ORAL_TABLET | ORAL | 5 refills | Status: DC

## 2018-02-02 MED ORDER — FLUTICASONE PROPIONATE 50 MCG/ACT NA SUSP: NASAL | 5 refills | Status: DC

## 2018-02-02 NOTE — Progress Notes (Signed)
[address] [place] [address] Dept: [phone]  New Patient Note  Patient ID: Catherine Olsen, female    DOB: May 14, 1970  Age: 47 y.o. MRN: [id number] Date of Office Visit: 02/02/2018 Referring provider: Shawnee Knapp, MD [address] [place], [place] [address]    Chief Complaint: Allergies (sinus, throat itching, angieoedema, swollen sinuses, mri yesterday for sinuses, issues with contrast. facial inplants with swelling)  HPI Catherine Olsen presents for evaluation of sinus problems for several years.  She has aggravation of her symptoms on exposure to dust mold, cigarette smoke cats, freshly mowed lawn.  She also has had episodes of at times swelling of her face and lips for several years When she has these episodes , she has hives of her hands.  She has had 2 allergic reactions in the past without a clear-cut reason.  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  She had nasal septal surgery for broken nose in the past.  She is followed at [facility]  for mild COPD.  She very rarely has to use a Ventolin inhaler.  She has had asthma for several years.  She has been smoking cigarettes for 35 years averaging at least a pack a day.Marland Kitchen  Her father has emphysema with alpha-1 antitrypsin deficiency.  She has a brother with mild alpha-1 antitrypsin deficiency.  She has refused to be tested for alpha-1 antitrypsin deficiency.  Over the past few weeks she has had gastroesophageal reflux.  She can tolerate prednisone at 40 mg/day or less.  Review of Systems  Constitutional: Negative.   HENT:       Nasal reconstruction August of this year.  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  Sinus problems for many years.  Eyes: Negative.   Respiratory:       History of asthma.  COPD mild  Cardiovascular: Negative.   Gastrointestinal: Negative for heartburn.       Worsening heartburn over the past few months    Genitourinary:       Surgery for endometriosis  Musculoskeletal:       Osteoarthritis  Skin:       Angioedema and hives for several years  Neurological:       History of migraine headaches.  History of a ruptured disc on her back  Endo/Heme/Allergies:       No diabetes or thyroid disease  Psychiatric/Behavioral:       Depression and bipolar disease    Outpatient Encounter Medications as of 02/02/2018  Medication Sig  . acetaminophen (TYLENOL) 325 MG tablet Take by mouth every 6 (six) hours as needed.  Marland Kitchen albuterol (VENTOLIN HFA) 108 (90 Base) MCG/ACT inhaler INHALE 2 PUFFS INTO THE LUNGS EVERY 6 HOURS AS NEEDED FOR WHEEZING OR SHORTNESS OF BREATH (Patient taking differently: Inhale 1-2 puffs into the lungs See admin instructions. 2 puff every 30 minutes until better)  . calcium carbonate (OSCAL) 1500 (600 Ca) MG TABS tablet Take 1,500 mg by mouth 2 (two) times daily with a meal.  . diphenhydrAMINE (BENADRYL) 25 mg capsule Take 50 mg by mouth every 6 (six) hours as needed.  Marland Kitchen EPINEPHrine 0.3 mg/0.3 mL IJ SOAJ injection Inject 0.3 mLs into the skin once.  Marland Kitchen estradiol (CLIMARA - DOSED IN MG/24 HR) 0.025 mg/24hr patch Place 0.025 mg onto the skin once a week.   Marland Kitchen ibuprofen (ADVIL,MOTRIN) 800 MG tablet Take 1 tablet (  800 mg total) by mouth every 8 (eight) hours as needed.  . magnesium gluconate (MAGONATE) 500 MG tablet Take 500 mg by mouth daily.  . Multiple Vitamin (MULTIVITAMIN) capsule Take 1 capsule by mouth daily.  . niacin 250 MG tablet Take 250 mg by mouth daily.  . progesterone (PROMETRIUM) 200 MG capsule Take 200 mg by mouth daily.   . temazepam (RESTORIL) 15 MG capsule Take 45 mg by mouth at bedtime.  . vitamin C (ASCORBIC ACID) 500 MG tablet Take 1,500 mg by mouth daily.  . famotidine (PEPCID) 20 MG tablet Take 1 tablet twice a day for reflux, may help hives  . fluticasone (FLONASE) 50 MCG/ACT nasal spray 1 spray per nostril twice a day if needed for stuffy nose   No  facility-administered encounter medications on file as of 02/02/2018.      Drug Allergies:  Allergies  Allergen Reactions  . Other Anaphylaxis    Idiopathic (possible binder or preservatives in medication)    . Prednisone Anaphylaxis    Steroids Steroids ( can take with benadryl )   Can not take higher than 40 mg   . Sulfamethoxazole Anaphylaxis  . Oxycodone Other (See Comments)    Severe blindness?  . Abilify [Aripiprazole]     Generic Abilify--causes severe neurological problems. Can use name brand  . Nicotine Swelling    Face swelled around implants when tried to quit smoking  . Penicillins Hives    Has patient had a PCN reaction causing immediate rash, facial/tongue/throat swelling, SOB or lightheadedness with hypotension: no Has patient had a PCN reaction causing severe rash involving mucus membranes or skin necrosis: NO Has patient had a PCN reaction that required hospitalizationNO Has patient had a PCN reaction occurring within the last 10 years:NO If all of the above answers are "NO", then may proceed with Cephalosporin use.   Marland Kitchen Percocet [Oxycodone-Acetaminophen] Palpitations  . Sulfa Antibiotics Hives  . Sulfites Other (See Comments)    unknown    Family History: Catherine Olsen's family history includes Alpha-1 antitrypsin deficiency in her brother and father; Alzheimer's disease in her paternal grandmother; Asthma in her brother, father, and maternal grandfather; COPD in her father; Diabetes in her maternal grandmother and mother; Heart disease in her maternal grandfather, maternal grandmother, and paternal grandfather; Hyperlipidemia in her father, maternal grandfather, maternal grandmother, and paternal grandfather; Hypertension in her father, maternal grandfather, maternal grandmother, mother, and paternal grandfather; Mental illness in her maternal grandmother and mother; Stroke in her paternal grandfather; Stroke (age of onset: 12) in her mother..  History is positive for  food allergy her mother.  Family history is negative for angioedema, eczema, hives, cystic fibrosis, lupus.  Social and environmental.  She has a cat at home.  Has been smoking cigarettes for 35 years averaging at least a pack a day.  She may have mildew at home  Physical Exam: BP 118/76   Pulse 67   Temp 98 F (36.7 C) (Oral)   Resp 20   Ht 5' 7.5" (1.715 m)   Wt 140 lb 9.6 oz (63.8 kg)   LMP 11/21/2014   SpO2 100%   BMI 21.70 kg/m    Physical Exam  Constitutional: She is oriented to person, place, and time. She appears well-developed and well-nourished.  HENT:  Eyes normal  Ears normal.  Nose normal.  Pharynx normal.  Neck: Neck supple. No thyromegaly present.  Cardiovascular:  S1-S2 normal no murmurs  Pulmonary/Chest:  Clear to percussion and auscultation  Abdominal: Soft.  There is no tenderness.  No hepatosplenomegaly  Lymphadenopathy:    She has no cervical adenopathy.  Neurological: She is alert and oriented to person, place, and time.  Skin:  Clear with mild dermographia noted  Psychiatric: She has a normal mood and affect. Her behavior is normal. Judgment and thought content normal.  Vitals reviewed.   Diagnostics: Allergy skin tests were positive to cat and some molds on intradermal testing only   Assessment  Assessment and Plan: 1. Allergy, unspecified, subsequent encounter   2. Angioedema, subsequent encounter   3. Mild intermittent asthma without complication   4. Other allergic rhinitis   5. Dermographia   6. Maxillofacial prosthesis present   7. Gastroesophageal reflux disease without esophagitis     Meds ordered this encounter  Medications  . famotidine (PEPCID) 20 MG tablet    Sig: Take 1 tablet twice a day for reflux, may help hives    Dispense:  60 tablet    Refill:  5  . fluticasone (FLONASE) 50 MCG/ACT nasal spray    Sig: 1 spray per nostril twice a day if needed for stuffy nose    Dispense:  18.2 g    Refill:  5    PT WILL CALL WHEN  NEEDED    Patient Instructions  Environmental control of dust and mold Keep the cat out of your bedroom Claritin 10 mg-take 1 tablet once a day if needed for runny nose or itchy eyes.  You may use 1 tablet twice a day if the itching is not controlled Pepcid 20 mg-take 1 tablet twice a day for reflux.  It may help hives Fluticasone 1 spray per nostril twice a day if needed for stuffy nose.  Do not use  until your ENT surgeon approves Ventolin 2 puffs every 4 hours if needed for wheezing or coughing spells Keep your follow-up appointment with [facility] Prednisone 10 mg twice a day for 4 days, 10 mg on the fifth day to bring your allergic symptoms under control If you have an allergic reaction take Benadryl 50 mg every 4 hours and if you have life-threatening symptoms inject with EpiPen 0.3 mg.  Then write what you had to eat or drink in the previous 4 hours Continue on your other medications I will call you with the results of your lab work Do foods with salicylates make you itch? Stop smoking cigarettes   Return in about 3 months (around 05/05/2018).   Thank you for the opportunity to care for this patient.  Please do not hesitate to contact me with questions.  Penne Lash, M.D.  [facility] [address] [place], [place] [address] [phone]

## 2018-02-02 NOTE — Patient Instructions (Addendum)
Environmental control of dust and mold Keep the cat out of your bedroom Claritin 10 mg-take 1 tablet once a day if needed for runny nose or itchy eyes.  You may use 1 tablet twice a day if the itching is not controlled Pepcid 20 mg-take 1 tablet twice a day for reflux.  It may help hives Fluticasone 1 spray per nostril twice a day if needed for stuffy nose.  Do not use  until your ENT surgeon approves Ventolin 2 puffs every 4 hours if needed for wheezing or coughing spells Keep your follow-up appointment with [facility] Prednisone 10 mg twice a day for 4 days, 10 mg on the fifth day to bring your allergic symptoms under control If you have an allergic reaction take Benadryl 50 mg every 4 hours and if you have life-threatening symptoms inject with EpiPen 0.3 mg.  Then write what you had to eat or drink in the previous 4 hours Continue on your other medications I will call you with the results of your lab work Do foods with salicylates make you itch? Stop smoking cigarettes

## 2018-02-03 ENCOUNTER — Telehealth: Payer: Self-pay | Admitting: Emergency Medicine

## 2018-02-03 ENCOUNTER — Encounter: Payer: Self-pay | Admitting: Emergency Medicine

## 2018-02-03 NOTE — Telephone Encounter (Signed)
Pt called again stating she has not been able to get a response all day. She states that no one was kind enough to call her back. She feel that done has let her down for the past 10 months. Pt is requesting call back tomorrow morning and hoping for results. I advised that Dr. Mitchel Honour is not in the office. Pt needs to know if her implant is infected and would like to know as soon as she can. She has checked mychart and nothing has been released but the ENT has received the summary results but ENT is not able to release the information she needs as far as if the implant was imaged and if she has an infection. [facility] told the pt the results were released to Dr. Mitchel Honour as STAT results.  Pt wants call back 02/04/18 - Call back # [phone]

## 2018-02-03 NOTE — Telephone Encounter (Signed)
Call [facility] MRI outpatient imaging and find out about her request.  Thanks.

## 2018-02-03 NOTE — Telephone Encounter (Signed)
Patient called again to request a call back from Dr Mitchel Honour stated that she does not want to speak with any nurse she would like to speak with Dr Mitchel Honour directly. Ph# [phone]

## 2018-02-03 NOTE — Telephone Encounter (Signed)
Copied from [place] [phone]. Topic: Quick Communication - See Telephone Encounter >> Feb 03, 2018 12:24 PM Antonieta Iba C wrote: CRM for notification. See Telephone encounter for: 02/03/18.  Pt would like a call back directly as soon as possible.  Pt says that she seen ENT and was told that they have forwarded results to Dr. Mitchel Honour. Pt is very upset that she has not received her results. Pt says that she need to know if infection was found in her imaging report or if implanted area was imaged and if it shows infection?   Pt is requesting a call back today.   CB: [phone]

## 2018-02-04 ENCOUNTER — Encounter: Payer: Self-pay | Admitting: Emergency Medicine

## 2018-02-04 NOTE — Telephone Encounter (Signed)
Pt has appt 11/1 with Sag[facility]. [facility] faxed MRI report over.  Report placed in Dr. Barry Brunner box at TL desk Copy to scan for chart  Pt notified via mychart - we have report

## 2018-02-05 ENCOUNTER — Encounter: Payer: Self-pay | Admitting: Emergency Medicine

## 2018-02-05 ENCOUNTER — Other Ambulatory Visit: Payer: Self-pay

## 2018-02-05 ENCOUNTER — Ambulatory Visit (INDEPENDENT_AMBULATORY_CARE_PROVIDER_SITE_OTHER): Payer: Medicare Other | Admitting: Emergency Medicine

## 2018-02-05 VITALS — BP 105/67 | HR 72 | Temp 97.8°F | Resp 16 | Wt 138.9 lb

## 2018-02-05 DIAGNOSIS — K05219 Aggressive periodontitis, localized, unspecified severity: Secondary | ICD-10-CM

## 2018-02-05 DIAGNOSIS — K0889 Other specified disorders of teeth and supporting structures: Secondary | ICD-10-CM

## 2018-02-05 DIAGNOSIS — K089 Disorder of teeth and supporting structures, unspecified: Secondary | ICD-10-CM | POA: Diagnosis not present

## 2018-02-05 DIAGNOSIS — G8929 Other chronic pain: Secondary | ICD-10-CM

## 2018-02-05 MED ORDER — HYDROCODONE-ACETAMINOPHEN 5-325 MG PO TABS: 1.0000 | ORAL_TABLET | Freq: Four times a day (QID) | ORAL | 0 refills | Status: DC | PRN
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
## 2018-02-05 NOTE — Patient Instructions (Addendum)
If you have lab work done today you will be contacted with your lab results within the next 2 weeks.  If you have not heard from [place] then please contact us. The fastest way to get your results is to register for My Chart.   IF you received an x-ray today, you will receive an invoice from [facility]. Please contact [facility] at [phone] with questions or concerns regarding your invoice.   IF you received labwork today, you will receive an invoice from [place]. Please contact [facility] at [phone] with questions or concerns regarding your invoice.   Our billing staff will not be able to assist you with questions regarding bills from these companies.  You will be contacted with the lab results as soon as they are available. The fastest way to get your results is to activate your My Chart account. Instructions are located on the last page of this paperwork. If you have not heard from [place] regarding the results in 2 weeks, please contact this office.     Dental Pain Dental pain may be caused by many things, including:  Tooth decay (cavities or caries). Cavities cause the nerve of your tooth to be open to air and hot or cold temperatures. This can cause pain or discomfort.  Abscess or infection. A dental abscess is an area that is full of infected pus from a bacterial infection in the inner part of the tooth (pulp). It usually happens at the end of the tooth's root.  Injury.  An unknown reason (idiopathic).  Your pain may be mild or severe. It may only happen when:  You are chewing.  You are exposed to hot or cold temperature.  You are eating or drinking sugary foods or beverages, such as: ? Soda. ? Candy.  Your pain may also be there all of the time. Follow these instructions at home: Watch your dental pain for any changes. Do these things to lessen your discomfort:  Take medicines only as told by your dentist.  If your dentist tells you to take an  antibiotic medicine, finish all of it even if you start to feel better.  Keep all follow-up visits as told by your dentist. This is important.  Do not apply heat to the outside of your face.  Rinse your mouth or gargle with salt water if told by your dentist. This helps with pain and swelling. ? You can make salt water by adding  tsp of salt to 1 cup of warm water.  Apply ice to the painful area of your face: ? Put ice in a plastic bag. ? Place a towel between your skin and the bag. ? Leave the ice on for 20 minutes, 2-3 times per day.  Avoid foods or drinks that cause you pain, such as: ? Very hot or very cold foods or drinks. ? Sweet or sugary foods or drinks.  Contact a doctor if:  Your pain is not helped with medicines.  Your symptoms are worse.  You have new symptoms. Get help right away if:  You cannot open your mouth.  You are having trouble breathing or swallowing.  You have a fever.  Your face, neck, or jaw is puffy (swollen). This information is not intended to replace advice given to you by your health care provider. Make sure you discuss any questions you have with your health care provider. Document Released: 09/10/2007 Document Revised: 08/30/2015 Document Reviewed: 03/20/2014 Elsevier Interactive Patient Education  2018 Elsevier  Inc.  

## 2018-02-16 DIAGNOSIS — J0101 Acute recurrent maxillary sinusitis: Secondary | ICD-10-CM | POA: Insufficient documentation

## 2018-02-17 ENCOUNTER — Ambulatory Visit: Payer: Medicare Other | Admitting: Emergency Medicine

## 2018-02-18 ENCOUNTER — Ambulatory Visit (INDEPENDENT_AMBULATORY_CARE_PROVIDER_SITE_OTHER): Payer: Medicare Other | Admitting: Emergency Medicine

## 2018-02-18 ENCOUNTER — Encounter: Payer: Self-pay | Admitting: Emergency Medicine

## 2018-02-18 ENCOUNTER — Other Ambulatory Visit: Payer: Self-pay

## 2018-02-18 VITALS — BP 98/62 | HR 70 | Temp 98.3°F | Resp 16 | Wt 137.8 lb

## 2018-02-18 DIAGNOSIS — G8929 Other chronic pain: Secondary | ICD-10-CM

## 2018-02-18 DIAGNOSIS — R220 Localized swelling, mass and lump, head: Secondary | ICD-10-CM

## 2018-02-18 DIAGNOSIS — K089 Disorder of teeth and supporting structures, unspecified: Secondary | ICD-10-CM

## 2018-02-18 NOTE — Patient Instructions (Addendum)
If you have lab work done today you will be contacted with your lab results within the next 2 weeks.  If you have not heard from [place] then please contact us. The fastest way to get your results is to register for My Chart.   IF you received an x-ray today, you will receive an invoice from [facility]. Please contact [facility] at [phone] with questions or concerns regarding your invoice.   IF you received labwork today, you will receive an invoice from [facility]. Please contact [facility] at [phone] with questions or concerns regarding your invoice.   Our billing staff will not be able to assist you with questions regarding bills from these companies.  You will be contacted with the lab results as soon as they are available. The fastest way to get your results is to activate your My Chart account. Instructions are located on the last page of this paperwork. If you have not heard from [place] regarding the results in 2 weeks, please contact this office.    Health Maintenance, Female Adopting a healthy lifestyle and getting preventive care can go a long way to promote health and wellness. Talk with your health care provider about what schedule of regular examinations is right for you. This is a good chance for you to check in with your provider about disease prevention and staying healthy. In between checkups, there are plenty of things you can do on your own. Experts have done a lot of research about which lifestyle changes and preventive measures are most likely to keep you healthy. Ask your health care provider for more information. Weight and diet Eat a healthy diet  Be sure to include plenty of vegetables, fruits, low-fat dairy products, and lean protein.  Do not eat a lot of foods high in solid fats, added sugars, or salt.  Get regular exercise. This is one of the most important things you can do for your health. ? Most adults should exercise for at least 150  minutes each week. The exercise should increase your heart rate and make you sweat (moderate-intensity exercise). ? Most adults should also do strengthening exercises at least twice a week. This is in addition to the moderate-intensity exercise.  Maintain a healthy weight  Body mass index (BMI) is a measurement that can be used to identify possible weight problems. It estimates body fat based on height and weight. Your health care provider can help determine your BMI and help you achieve or maintain a healthy weight.  For females 11 years of age and older: ? A BMI below 18.5 is considered underweight. ? A BMI of 18.5 to 24.9 is normal. ? A BMI of 25 to 29.9 is considered overweight. ? A BMI of 30 and above is considered obese.  Watch levels of cholesterol and blood lipids  You should start having your blood tested for lipids and cholesterol at 46 years of age, then have this test every 5 years.  You may need to have your cholesterol levels checked more often if: ? Your lipid or cholesterol levels are high. ? You are older than 46 years of age. ? You are at high risk for heart disease.  Cancer screening Lung Cancer  Lung cancer screening is recommended for adults 71-81 years old who are at high risk for lung cancer because of a history of smoking.  A yearly low-dose CT scan of the lungs is recommended for people who: ? Currently smoke. ? Have quit within  the past 15 years. ? Have at least a 30-pack-year history of smoking. A pack year is smoking an average of one pack of cigarettes a day for 1 year.  Yearly screening should continue until it has been 15 years since you quit.  Yearly screening should stop if you develop a health problem that would prevent you from having lung cancer treatment.  Breast Cancer  Practice breast self-awareness. This means understanding how your breasts normally appear and feel.  It also means doing regular breast self-exams. Let your health care  provider know about any changes, no matter how small.  If you are in your 20s or 30s, you should have a clinical breast exam (CBE) by a health care provider every 1-3 years as part of a regular health exam.  If you are 46 or older, have a CBE every year. Also consider having a breast X-ray (mammogram) every year.  If you have a family history of breast cancer, talk to your health care provider about genetic screening.  If you are at high risk for breast cancer, talk to your health care provider about having an MRI and a mammogram every year.  Breast cancer gene (BRCA) assessment is recommended for women who have family members with BRCA-related cancers. BRCA-related cancers include: ? Breast. ? Ovarian. ? Tubal. ? Peritoneal cancers.  Results of the assessment will determine the need for genetic counseling and BRCA1 and BRCA2 testing.  Cervical Cancer Your health care provider may recommend that you be screened regularly for cancer of the pelvic organs (ovaries, uterus, and vagina). This screening involves a pelvic examination, including checking for microscopic changes to the surface of your cervix (Pap test). You may be encouraged to have this screening done every 3 years, beginning at age 22.  For women ages 86-65, health care providers may recommend pelvic exams and Pap testing every 3 years, or they may recommend the Pap and pelvic exam, combined with testing for human papilloma virus (HPV), every 5 years. Some types of HPV increase your risk of cervical cancer. Testing for HPV may also be done on women of any age with unclear Pap test results.  Other health care providers may not recommend any screening for nonpregnant women who are considered low risk for pelvic cancer and who do not have symptoms. Ask your health care provider if a screening pelvic exam is right for you.  If you have had past treatment for cervical cancer or a condition that could lead to cancer, you need Pap tests  and screening for cancer for at least 20 years after your treatment. If Pap tests have been discontinued, your risk factors (such as having a new sexual partner) need to be reassessed to determine if screening should resume. Some women have medical problems that increase the chance of getting cervical cancer. In these cases, your health care provider may recommend more frequent screening and Pap tests.  Colorectal Cancer  This type of cancer can be detected and often prevented.  Routine colorectal cancer screening usually begins at 46 years of age and continues through 46 years of age.  Your health care provider may recommend screening at an earlier age if you have risk factors for colon cancer.  Your health care provider may also recommend using home test kits to check for hidden blood in the stool.  A small camera at the end of a tube can be used to examine your colon directly (sigmoidoscopy or colonoscopy). This is done to check for  the earliest forms of colorectal cancer.  Routine screening usually begins at age 50.  Direct examination of the colon should be repeated every 5-10 years through 46 years of age. However, you may need to be screened more often if early forms of precancerous polyps or small growths are found.  Skin Cancer  Check your skin from head to toe regularly.  Tell your health care provider about any new moles or changes in moles, especially if there is a change in a mole's shape or color.  Also tell your health care provider if you have a mole that is larger than the size of a pencil eraser.  Always use sunscreen. Apply sunscreen liberally and repeatedly throughout the day.  Protect yourself by wearing long sleeves, pants, a wide-brimmed hat, and sunglasses whenever you are outside.  Heart disease, diabetes, and high blood pressure  High blood pressure causes heart disease and increases the risk of stroke. High blood pressure is more likely to develop  in: ? People who have blood pressure in the high end of the normal range (130-139/85-89 mm Hg). ? People who are overweight or obese. ? People who are African American.  If you are 18-39 years of age, have your blood pressure checked every 3-5 years. If you are 40 years of age or older, have your blood pressure checked every year. You should have your blood pressure measured twice-once when you are at a hospital or clinic, and once when you are not at a hospital or clinic. Record the average of the two measurements. To check your blood pressure when you are not at a hospital or clinic, you can use: ? An automated blood pressure machine at a pharmacy. ? A home blood pressure monitor.  If you are between 55 years and 79 years old, ask your health care provider if you should take aspirin to prevent strokes.  Have regular diabetes screenings. This involves taking a blood sample to check your fasting blood sugar level. ? If you are at a normal weight and have a low risk for diabetes, have this test once every three years after 45 years of age. ? If you are overweight and have a high risk for diabetes, consider being tested at a younger age or more often. Preventing infection Hepatitis B  If you have a higher risk for hepatitis B, you should be screened for this virus. You are considered at high risk for hepatitis B if: ? You were born in a country where hepatitis B is common. Ask your health care provider which countries are considered high risk. ? Your parents were born in a high-risk country, and you have not been immunized against hepatitis B (hepatitis B vaccine). ? You have HIV or AIDS. ? You use needles to inject street drugs. ? You live with someone who has hepatitis B. ? You have had sex with someone who has hepatitis B. ? You get hemodialysis treatment. ? You take certain medicines for conditions, including cancer, organ transplantation, and autoimmune conditions.  Hepatitis C  Blood  testing is recommended for: ? Everyone born from 1945 through 1965. ? Anyone with known risk factors for hepatitis C.  Sexually transmitted infections (STIs)  You should be screened for sexually transmitted infections (STIs) including gonorrhea and chlamydia if: ? You are sexually active and are younger than 46 years of age. ? You are older than 46 years of age and your health care provider tells you that you are at risk for this   type of infection. ? Your sexual activity has changed since you were last screened and you are at an increased risk for chlamydia or gonorrhea. Ask your health care provider if you are at risk.  If you do not have HIV, but are at risk, it may be recommended that you take a prescription medicine daily to prevent HIV infection. This is called pre-exposure prophylaxis (PrEP). You are considered at risk if: ? You are sexually active and do not regularly use condoms or know the HIV status of your partner(s). ? You take drugs by injection. ? You are sexually active with a partner who has HIV.  Talk with your health care provider about whether you are at high risk of being infected with HIV. If you choose to begin PrEP, you should first be tested for HIV. You should then be tested every 3 months for as long as you are taking PrEP. Pregnancy  If you are premenopausal and you may become pregnant, ask your health care provider about preconception counseling.  If you may become pregnant, take 400 to 800 micrograms (mcg) of folic acid every day.  If you want to prevent pregnancy, talk to your health care provider about birth control (contraception). Osteoporosis and menopause  Osteoporosis is a disease in which the bones lose minerals and strength with aging. This can result in serious bone fractures. Your risk for osteoporosis can be identified using a bone density scan.  If you are 28 years of age or older, or if you are at risk for osteoporosis and fractures, ask your  health care provider if you should be screened.  Ask your health care provider whether you should take a calcium or vitamin D supplement to lower your risk for osteoporosis.  Menopause may have certain physical symptoms and risks.  Hormone replacement therapy may reduce some of these symptoms and risks. Talk to your health care provider about whether hormone replacement therapy is right for you. Follow these instructions at home:  Schedule regular health, dental, and eye exams.  Stay current with your immunizations.  Do not use any tobacco products including cigarettes, chewing tobacco, or electronic cigarettes.  If you are pregnant, do not drink alcohol.  If you are breastfeeding, limit how much and how often you drink alcohol.  Limit alcohol intake to no more than 1 drink per day for nonpregnant women. One drink equals 12 ounces of beer, 5 ounces of wine, or 1 ounces of hard liquor.  Do not use street drugs.  Do not share needles.  Ask your health care provider for help if you need support or information about quitting drugs.  Tell your health care provider if you often feel depressed.  Tell your health care provider if you have ever been abused or do not feel safe at home. This information is not intended to replace advice given to you by your health care provider. Make sure you discuss any questions you have with your health care provider. Document Released: 10/07/2010 Document Revised: 08/30/2015 Document Reviewed: 12/26/2014 [employer]vier Interactive Patient Education  [employer].

## 2018-02-18 NOTE — Progress Notes (Signed)
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  If you are breastfeeding, limit how much and how often you drink alcohol.  Limit alcohol intake to no more than 1 drink per day for nonpregnant women. One drink equals 12 ounces of beer, 5 ounces of wine, or 1 ounces of hard liquor.  Do not use street drugs.  Do not share needles.  Ask your health care provider for help if you need support or information about quitting drugs.  Tell your health care provider if you often feel depressed.  Tell your health care provider if you have ever been abused or do not feel safe at home. This information is not intended to replace advice given to you by your health care provider. Make sure you discuss any questions you have with your health care provider. Document  Released: 10/07/2010 Document Revised: 08/30/2015 Document Reviewed: 12/26/2014 Elsevier Interactive Patient Education  2018 Elsevier Inc.      Agustina Caroli, MD [facility]

## 2018-03-05 ENCOUNTER — Telehealth: Payer: Self-pay | Admitting: Emergency Medicine

## 2018-03-05 NOTE — Telephone Encounter (Signed)
LVM for pt to call the office and reschedule their appt on 04/20/18 with Dr. Mitchel Honour. When pt calls back, please schedule at their convenience.

## 2018-03-23 ENCOUNTER — Telehealth: Payer: Self-pay

## 2018-03-23 NOTE — Telephone Encounter (Signed)
TC from pt forwarded to Dr Shaune Leeks

## 2018-03-23 NOTE — Telephone Encounter (Deleted)
My general practice doctor has bern prescribing my Albuterol, but my allergies are making it such that I need two puffs every four hours, instead of the every six hours my gp prescribed. Can you send a prescription for Ventolin Albuterol 200 puffs inhaler two puffs every four hours [facility] [address] [facility] [address] [facility] [place]. I can't afford to refill my current prescription out of pocket. The pharmacist suggested I message you with the above recommendation. My cat and mold allergies are likely to blame, as well as my mother's, insistence on using bleach to clean, which immediately sends me into a bad attack. Stress seems to cause attacks also. Please, let me know.    I'm sorry I have not completed my blood work yet. I had a sinus infection from a bad tooth that was long-standing and had spread. That was the cause of my facial/sinus swelling. I just got my crown after root canal and a very strong, black box warning, antibiotic. I have not had time to go to lab.

## 2018-04-01 ENCOUNTER — Other Ambulatory Visit: Payer: Self-pay | Admitting: Allergy

## 2018-04-01 MED ORDER — ALBUTEROL SULFATE HFA 108 (90 BASE) MCG/ACT IN AERS: 2.0000 | INHALATION_SPRAY | RESPIRATORY_TRACT | 1 refills | Status: DC | PRN

## 2018-04-01 NOTE — Progress Notes (Signed)
Pt called saying the Ventolin she requested refill of was not sent to pharmacy.  She reports having 13 puffs left of current inhaler.   I sent refill of her Ventolin to her pharmacy.

## 2018-04-05 ENCOUNTER — Encounter: Payer: Self-pay | Admitting: Emergency Medicine

## 2018-04-05 NOTE — Telephone Encounter (Signed)
Please call pt to have appointment scheduled per her request.  Thank you!

## 2018-04-05 NOTE — Telephone Encounter (Signed)
LVM for pt to call the office and schedule an appt for tomorrow with Dr. Mitchel Honour. Brigitte Pulse is booked out until 04/29/18 but Dr. Mitchel Honour has some sameday appts open for tomorrow. When pt calls back, please schedule. Thank you!

## 2018-04-06 ENCOUNTER — Encounter: Payer: Self-pay | Admitting: Emergency Medicine

## 2018-04-06 ENCOUNTER — Other Ambulatory Visit: Payer: Self-pay

## 2018-04-06 ENCOUNTER — Ambulatory Visit (INDEPENDENT_AMBULATORY_CARE_PROVIDER_SITE_OTHER): Payer: Medicare Other | Admitting: Emergency Medicine

## 2018-04-06 VITALS — BP 109/61 | HR 81 | Temp 97.9°F | Resp 16 | Wt 134.8 lb

## 2018-04-06 DIAGNOSIS — R220 Localized swelling, mass and lump, head: Secondary | ICD-10-CM

## 2018-04-06 DIAGNOSIS — K089 Disorder of teeth and supporting structures, unspecified: Secondary | ICD-10-CM | POA: Diagnosis not present

## 2018-04-06 DIAGNOSIS — G8929 Other chronic pain: Secondary | ICD-10-CM | POA: Diagnosis not present

## 2018-04-06 MED ORDER — PREDNISONE 20 MG PO TABS: 40.0000 mg | ORAL_TABLET | Freq: Every day | ORAL | 0 refills | Status: AC
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
## 2018-04-06 NOTE — Patient Instructions (Addendum)
If you have lab work done today you will be contacted with your lab results within the next 2 weeks.  If you have not heard from [place] then please contact us. The fastest way to get your results is to register for My Chart.   IF you received an x-ray today, you will receive an invoice from [facility]. Please contact [facility] at [phone] with questions or concerns regarding your invoice.   IF you received labwork today, you will receive an invoice from [place]. Please contact [facility] at [phone] with questions or concerns regarding your invoice.   Our billing staff will not be able to assist you with questions regarding bills from these companies.  You will be contacted with the lab results as soon as they are available. The fastest way to get your results is to activate your My Chart account. Instructions are located on the last page of this paperwork. If you have not heard from [place] regarding the results in 2 weeks, please contact this office.     Dental Pain Dental pain may be caused by many things, including:  Tooth decay (cavities or caries).  Infection.  The inner part of the tooth being filled with pus (abscess).  Injury. Sometimes the cause of pain is unknown. Your pain can vary. It may be mild or severe. You may have it all the time, or it may occur only when you are:  Chewing.  Exposed to hot or cold temperature.  Eating or drinking sugary foods or beverages, such as soda or candy. Follow these instructions at home: Medicines  Take over-the-counter and prescription medicines only as told by your doctor.  If you were prescribed an antibiotic medicine, take it as told by your doctor. Do not stop taking the medicine even if you start to feel better. Eating and drinking  Do not eat foods or drinks that cause you pain. These include: ? Very hot or very cold foods or drinks. ? Sweet or sugary foods or drinks. Managing pain and  swelling   Gargle with a salt-water mixture 3-4 times a day. To make this, dissolve -1 tsp of salt in 1 cup of warm water.  If told, put ice on the painful area of your face: ? Put ice in a plastic bag. ? Place a towel between your skin and the bag. ? Leave the ice on for 20 minutes, 2-3 times a day. Brushing your teeth  Brush your teeth twice a day using a fluoride toothpaste.  Floss your teeth once a day.  Use a toothpaste made for sensitive teeth as told by your doctor.  Use a soft toothbrush. General instructions  Do not apply heat to the outside of your face.  Watch your dental pain. Let your doctor know if there are any changes.  Keep all follow-up visits as told by your doctor. This is important. Contact a doctor if:  Your pain is not relieved by medicines.  You have new symptoms.  Your symptoms get worse. Get help right away if:  You cannot open your mouth.  You are having trouble breathing or swallowing.  You have a fever.  Your face, neck, or jaw is swollen. Summary  Dental pain may be caused by many things, including tooth decay, injury, or infection. In some cases, the cause is not known.  Your pain may be mild or severe. You may have pain all the time, or you may have it only when you eat or  drink.  Take over-the-counter and prescription medicines only as told by your doctor.  Watch your dental pain for any changes. Let your doctor know if symptoms get worse. This information is not intended to replace advice given to you by your health care provider. Make sure you discuss any questions you have with your health care provider. Document Released: 09/10/2007 Document Revised: 04/06/2017 Document Reviewed: 04/06/2017 Elsevier Interactive Patient Education  2019 [employer].

## 2018-04-10 ENCOUNTER — Encounter: Payer: Self-pay | Admitting: Emergency Medicine

## 2018-04-12 ENCOUNTER — Telehealth: Payer: Self-pay | Admitting: Family Medicine

## 2018-04-12 ENCOUNTER — Encounter: Payer: Self-pay | Admitting: Emergency Medicine

## 2018-04-12 DIAGNOSIS — F3132 Bipolar disorder, current episode depressed, moderate: Secondary | ICD-10-CM | POA: Diagnosis not present

## 2018-04-12 DIAGNOSIS — F4312 Post-traumatic stress disorder, chronic: Secondary | ICD-10-CM | POA: Diagnosis not present

## 2018-04-12 NOTE — Telephone Encounter (Signed)
Copied from [place] [id number]. Topic: Quick Communication - See Telephone Encounter >> Apr 12, 2018  2:48 PM Burchel, Abbi R wrote: CRM for notification. See Telephone encounter for: 04/12/18.  Pt is requesting referral for MRI be faxed to: [name] as soon as possible.  Pt states she has spoken with the imaging center and they have not received the original referral.  Pt states her pain is increasing  (see myChart msg) and is requesting that referral be processed as quickly as possible.    FAX: [phone]

## 2018-04-13 ENCOUNTER — Encounter: Payer: Self-pay | Admitting: Emergency Medicine

## 2018-04-13 DIAGNOSIS — F3132 Bipolar disorder, current episode depressed, moderate: Secondary | ICD-10-CM | POA: Diagnosis not present

## 2018-04-13 DIAGNOSIS — F4312 Post-traumatic stress disorder, chronic: Secondary | ICD-10-CM | POA: Diagnosis not present

## 2018-04-14 ENCOUNTER — Other Ambulatory Visit: Payer: Self-pay

## 2018-04-14 ENCOUNTER — Ambulatory Visit (INDEPENDENT_AMBULATORY_CARE_PROVIDER_SITE_OTHER): Payer: Medicare Other | Admitting: Emergency Medicine

## 2018-04-14 DIAGNOSIS — R220 Localized swelling, mass and lump, head: Secondary | ICD-10-CM | POA: Diagnosis not present

## 2018-04-14 NOTE — Patient Instructions (Addendum)
   If you have lab work done today you will be contacted with your lab results within the next 2 weeks.  If you have not heard from us then please contact us. The fastest way to get your results is to register for My Chart.   IF you received an x-ray today, you will receive an invoice from [facility]. Please contact [facility] at [phone] with questions or concerns regarding your invoice.   IF you received labwork today, you will receive an invoice from [facility]. Please contact [facility] at [phone] with questions or concerns regarding your invoice.   Our billing staff will not be able to assist you with questions regarding bills from these companies.  You will be contacted with the lab results as soon as they are available. The fastest way to get your results is to activate your My Chart account. Instructions are located on the last page of this paperwork. If you have not heard from us regarding the results in 2 weeks, please contact this office.    Health Maintenance, Female Adopting a healthy lifestyle and getting preventive care can go a long way to promote health and wellness. Talk with your health care provider about what schedule of regular examinations is right for you. This is a good chance for you to check in with your provider about disease prevention and staying healthy. In between checkups, there are plenty of things you can do on your own. Experts have done a lot of research about which lifestyle changes and preventive measures are most likely to keep you healthy. Ask your health care provider for more information. Weight and diet Eat a healthy diet  Be sure to include plenty of vegetables, fruits, low-fat dairy products, and lean protein.  Do not eat a lot of foods high in solid fats, added sugars, or salt.  Get regular exercise. This is one of the most important things you can do for your health. ? Most adults should exercise for at least 150  minutes each week. The exercise should increase your heart rate and make you sweat (moderate-intensity exercise). ? Most adults should also do strengthening exercises at least twice a week. This is in addition to the moderate-intensity exercise. Maintain a healthy weight  Body mass index (BMI) is a measurement that can be used to identify possible weight problems. It estimates body fat based on height and weight. Your health care provider can help determine your BMI and help you achieve or maintain a healthy weight.  For females 20 years of age and older: ? A BMI below 18.5 is considered underweight. ? A BMI of 18.5 to 24.9 is normal. ? A BMI of 25 to 29.9 is considered overweight. ? A BMI of 30 and above is considered obese. Watch levels of cholesterol and blood lipids  You should start having your blood tested for lipids and cholesterol at 47 years of age, then have this test every 5 years.  You may need to have your cholesterol levels checked more often if: ? Your lipid or cholesterol levels are high. ? You are older than 47 years of age. ? You are at high risk for heart disease. Cancer screening Lung Cancer  Lung cancer screening is recommended for adults 55-80 years old who are at high risk for lung cancer because of a history of smoking.  A yearly low-dose CT scan of the lungs is recommended for people who: ? Currently smoke. ? Have quit within the past 15   years. ? Have at least a 30-pack-year history of smoking. A pack year is smoking an average of one pack of cigarettes a day for 1 year.  Yearly screening should continue until it has been 15 years since you quit.  Yearly screening should stop if you develop a health problem that would prevent you from having lung cancer treatment. Breast Cancer  Practice breast self-awareness. This means understanding how your breasts normally appear and feel.  It also means doing regular breast self-exams. Let your health care provider  know about any changes, no matter how small.  If you are in your 20s or 30s, you should have a clinical breast exam (CBE) by a health care provider every 1-3 years as part of a regular health exam.  If you are 40 or older, have a CBE every year. Also consider having a breast X-ray (mammogram) every year.  If you have a family history of breast cancer, talk to your health care provider about genetic screening.  If you are at high risk for breast cancer, talk to your health care provider about having an MRI and a mammogram every year.  Breast cancer gene (BRCA) assessment is recommended for women who have family members with BRCA-related cancers. BRCA-related cancers include: ? Breast. ? Ovarian. ? Tubal. ? Peritoneal cancers.  Results of the assessment will determine the need for genetic counseling and BRCA1 and BRCA2 testing. Cervical Cancer Your health care provider may recommend that you be screened regularly for cancer of the pelvic organs (ovaries, uterus, and vagina). This screening involves a pelvic examination, including checking for microscopic changes to the surface of your cervix (Pap test). You may be encouraged to have this screening done every 3 years, beginning at age 21.  For women ages 30-65, health care providers may recommend pelvic exams and Pap testing every 3 years, or they may recommend the Pap and pelvic exam, combined with testing for human papilloma virus (HPV), every 5 years. Some types of HPV increase your risk of cervical cancer. Testing for HPV may also be done on women of any age with unclear Pap test results.  Other health care providers may not recommend any screening for nonpregnant women who are considered low risk for pelvic cancer and who do not have symptoms. Ask your health care provider if a screening pelvic exam is right for you.  If you have had past treatment for cervical cancer or a condition that could lead to cancer, you need Pap tests and  screening for cancer for at least 20 years after your treatment. If Pap tests have been discontinued, your risk factors (such as having a new sexual partner) need to be reassessed to determine if screening should resume. Some women have medical problems that increase the chance of getting cervical cancer. In these cases, your health care provider may recommend more frequent screening and Pap tests. Colorectal Cancer  This type of cancer can be detected and often prevented.  Routine colorectal cancer screening usually begins at 47 years of age and continues through 47 years of age.  Your health care provider may recommend screening at an earlier age if you have risk factors for colon cancer.  Your health care provider may also recommend using home test kits to check for hidden blood in the stool.  A small camera at the end of a tube can be used to examine your colon directly (sigmoidoscopy or colonoscopy). This is done to check for the earliest forms of colorectal cancer.    Routine screening usually begins at age 50.  Direct examination of the colon should be repeated every 5-10 years through 47 years of age. However, you may need to be screened more often if early forms of precancerous polyps or small growths are found. Skin Cancer  Check your skin from head to toe regularly.  Tell your health care provider about any new moles or changes in moles, especially if there is a change in a mole's shape or color.  Also tell your health care provider if you have a mole that is larger than the size of a pencil eraser.  Always use sunscreen. Apply sunscreen liberally and repeatedly throughout the day.  Protect yourself by wearing long sleeves, pants, a wide-brimmed hat, and sunglasses whenever you are outside. Heart disease, diabetes, and high blood pressure  High blood pressure causes heart disease and increases the risk of stroke. High blood pressure is more likely to develop in: ? People who  have blood pressure in the high end of the normal range (130-139/85-89 mm Hg). ? People who are overweight or obese. ? People who are African American.  If you are 18-39 years of age, have your blood pressure checked every 3-5 years. If you are 40 years of age or older, have your blood pressure checked every year. You should have your blood pressure measured twice-once when you are at a hospital or clinic, and once when you are not at a hospital or clinic. Record the average of the two measurements. To check your blood pressure when you are not at a hospital or clinic, you can use: ? An automated blood pressure machine at a pharmacy. ? A home blood pressure monitor.  If you are between 55 years and 79 years old, ask your health care provider if you should take aspirin to prevent strokes.  Have regular diabetes screenings. This involves taking a blood sample to check your fasting blood sugar level. ? If you are at a normal weight and have a low risk for diabetes, have this test once every three years after 47 years of age. ? If you are overweight and have a high risk for diabetes, consider being tested at a younger age or more often. Preventing infection Hepatitis B  If you have a higher risk for hepatitis B, you should be screened for this virus. You are considered at high risk for hepatitis B if: ? You were born in a country where hepatitis B is common. Ask your health care provider which countries are considered high risk. ? Your parents were born in a high-risk country, and you have not been immunized against hepatitis B (hepatitis B vaccine). ? You have HIV or AIDS. ? You use needles to inject street drugs. ? You live with someone who has hepatitis B. ? You have had sex with someone who has hepatitis B. ? You get hemodialysis treatment. ? You take certain medicines for conditions, including cancer, organ transplantation, and autoimmune conditions. Hepatitis C  Blood testing is  recommended for: ? Everyone born from 1945 through 1965. ? Anyone with known risk factors for hepatitis C. Sexually transmitted infections (STIs)  You should be screened for sexually transmitted infections (STIs) including gonorrhea and chlamydia if: ? You are sexually active and are younger than 47 years of age. ? You are older than 47 years of age and your health care provider tells you that you are at risk for this type of infection. ? Your sexual activity has changed since you   were last screened and you are at an increased risk for chlamydia or gonorrhea. Ask your health care provider if you are at risk.  If you do not have HIV, but are at risk, it may be recommended that you take a prescription medicine daily to prevent HIV infection. This is called pre-exposure prophylaxis (PrEP). You are considered at risk if: ? You are sexually active and do not regularly use condoms or know the HIV status of your partner(s). ? You take drugs by injection. ? You are sexually active with a partner who has HIV. Talk with your health care provider about whether you are at high risk of being infected with HIV. If you choose to begin PrEP, you should first be tested for HIV. You should then be tested every 3 months for as long as you are taking PrEP. Pregnancy  If you are premenopausal and you may become pregnant, ask your health care provider about preconception counseling.  If you may become pregnant, take 400 to 800 micrograms (mcg) of folic acid every day.  If you want to prevent pregnancy, talk to your health care provider about birth control (contraception). Osteoporosis and menopause  Osteoporosis is a disease in which the bones lose minerals and strength with aging. This can result in serious bone fractures. Your risk for osteoporosis can be identified using a bone density scan.  If you are 65 years of age or older, or if you are at risk for osteoporosis and fractures, ask your health care  provider if you should be screened.  Ask your health care provider whether you should take a calcium or vitamin D supplement to lower your risk for osteoporosis.  Menopause may have certain physical symptoms and risks.  Hormone replacement therapy may reduce some of these symptoms and risks. Talk to your health care provider about whether hormone replacement therapy is right for you. Follow these instructions at home:  Schedule regular health, dental, and eye exams.  Stay current with your immunizations.  Do not use any tobacco products including cigarettes, chewing tobacco, or electronic cigarettes.  If you are pregnant, do not drink alcohol.  If you are breastfeeding, limit how much and how often you drink alcohol.  Limit alcohol intake to no more than 1 drink per day for nonpregnant women. One drink equals 12 ounces of beer, 5 ounces of wine, or 1 ounces of hard liquor.  Do not use street drugs.  Do not share needles.  Ask your health care provider for help if you need support or information about quitting drugs.  Tell your health care provider if you often feel depressed.  Tell your health care provider if you have ever been abused or do not feel safe at home. This information is not intended to replace advice given to you by your health care provider. Make sure you discuss any questions you have with your health care provider. Document Released: 10/07/2010 Document Revised: 08/30/2015 Document Reviewed: 12/26/2014 [employer] Interactive Patient Education  2019 [employer]  

## 2018-04-15 ENCOUNTER — Encounter: Payer: Self-pay | Admitting: Emergency Medicine

## 2018-04-15 NOTE — Progress Notes (Signed)
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 Revised: 08/30/2015 Document Reviewed: 12/26/2014 Elsevier Interactive Patient Education  2019 Elsevier Inc.      Agustina Caroli, MD [facility]

## 2018-04-16 ENCOUNTER — Telehealth: Payer: Self-pay | Admitting: Family Medicine

## 2018-04-16 ENCOUNTER — Encounter: Payer: Self-pay | Admitting: Emergency Medicine

## 2018-04-16 NOTE — Telephone Encounter (Signed)
Copied from [place] [id number]. Topic: Quick Communication - See Telephone Encounter >> Apr 16, 2018  5:46 PM Blase Mess A wrote: CRM for notification. See Telephone encounter for: 04/16/18.   Patient is calling [facility] says the MRI is not ordered right still. They say it is for brain, is not at all like the MRI ordered in October. Patent states and adding notes for sinuses, but [facility] says the order will only image brain. Calling to correct the order please advise Thank you

## 2018-04-19 ENCOUNTER — Other Ambulatory Visit: Payer: Self-pay | Admitting: Emergency Medicine

## 2018-04-19 DIAGNOSIS — R220 Localized swelling, mass and lump, head: Secondary | ICD-10-CM

## 2018-04-19 DIAGNOSIS — F4312 Post-traumatic stress disorder, chronic: Secondary | ICD-10-CM | POA: Diagnosis not present

## 2018-04-19 DIAGNOSIS — F3132 Bipolar disorder, current episode depressed, moderate: Secondary | ICD-10-CM | POA: Diagnosis not present

## 2018-04-19 NOTE — Telephone Encounter (Signed)
Please advise 

## 2018-04-19 NOTE — Telephone Encounter (Signed)
New MRI order placed. Thanks.

## 2018-04-19 NOTE — Telephone Encounter (Signed)
Informed pt referral has been placed.

## 2018-04-20 ENCOUNTER — Ambulatory Visit: Payer: Medicare Other | Admitting: Emergency Medicine

## 2018-04-26 DIAGNOSIS — F4312 Post-traumatic stress disorder, chronic: Secondary | ICD-10-CM | POA: Diagnosis not present

## 2018-04-26 DIAGNOSIS — F3132 Bipolar disorder, current episode depressed, moderate: Secondary | ICD-10-CM | POA: Diagnosis not present

## 2018-04-27 ENCOUNTER — Encounter: Payer: Self-pay | Admitting: Emergency Medicine

## 2018-04-27 DIAGNOSIS — R220 Localized swelling, mass and lump, head: Secondary | ICD-10-CM | POA: Diagnosis not present

## 2018-04-28 ENCOUNTER — Encounter: Payer: Self-pay | Admitting: Emergency Medicine

## 2018-04-29 ENCOUNTER — Telehealth: Payer: Self-pay | Admitting: Emergency Medicine

## 2018-04-29 DIAGNOSIS — K120 Recurrent oral aphthae: Secondary | ICD-10-CM | POA: Diagnosis not present

## 2018-04-29 NOTE — Telephone Encounter (Signed)
Called to the office of Catherine Olsen who was speaking to the patient. She reports that the MRI of her face was concerning and she had a report that said osteomylitis is a concern.  This provider pulled up careeverywhere while patient was on the phone and read the report to her.  The impression was inflamed left first maxillary molar with some enhancement of the soft tissue that was mild and asymmetric  Advised her to follow up with a dentist or plastic surgeon She has buccal implants and is concerned that the implants were infected Discussed that she should follow up with at least a dentist because all indication on the MRI was that she had an infected tooth.  She was offered an appointment with Dr. Mitchel Honour but was very distressed as to who could sit with her and read the MRI Expressed that I do not know why the radiologist told her that when there was no indication in the report. She is stable and was advised to follow up as outpatient  She states that she cannot get in with her dentist Discussed that I could send in an antibiotic but she reports that she has completed levaquin x2 and is allergic to penicillins and sulfa She did not think another antibiotic would help  She was advised to go to the ER for fevers, worsening pain, eye pressure   She reports that she will keep trying her dentist but will keep the appointment with Dr. Mitchel Honour.

## 2018-04-29 NOTE — Telephone Encounter (Signed)
Copied from [place] [phone]. Topic: Appointment Scheduling - Scheduling Inquiry for Clinic >> Apr 27, 2018  9:47 AM Conception Chancy, NT wrote: Reason for CRM: patient is calling and states she has her MRI scheduled for 05/01/18 and she is wanting to see Dr. Mitchel Honour next week due to the infection in her face. Doctor first available is in February. She states that this is not her fault this is Cones fault and whoever scheduled the MRI because she was supposed to have this done 2 weeks ago. Patient is very upset and is requesting someone give her a call back today in regards to this. >> Apr 28, 2018  5:51 PM Waylan Rocher, Lumin L wrote: Patient called and knows the results were released hours ago from [facility] and wants to know why her results have not been relayed to her. She is sick and in pain and highly upset. And she still hasn't heard back from Environmental education officer. Attempted flow line multiple times, no answer >> Apr 29, 2018  8:21 AM Lennox Solders wrote: Pt is calling and would like the MRI results. Please call at [phone]

## 2018-04-30 NOTE — Telephone Encounter (Signed)
Thank you, Zoe.  I agree with your assessment.  I will follow-up on this.

## 2018-05-03 ENCOUNTER — Other Ambulatory Visit: Payer: Self-pay

## 2018-05-03 ENCOUNTER — Ambulatory Visit (INDEPENDENT_AMBULATORY_CARE_PROVIDER_SITE_OTHER): Payer: Medicare Other | Admitting: Emergency Medicine

## 2018-05-03 ENCOUNTER — Encounter: Payer: Self-pay | Admitting: Emergency Medicine

## 2018-05-03 VITALS — BP 122/82 | HR 82 | Temp 98.0°F | Resp 16 | Wt 139.0 lb

## 2018-05-03 DIAGNOSIS — R51 Headache: Secondary | ICD-10-CM

## 2018-05-03 DIAGNOSIS — G8929 Other chronic pain: Secondary | ICD-10-CM | POA: Diagnosis not present

## 2018-05-03 DIAGNOSIS — R220 Localized swelling, mass and lump, head: Secondary | ICD-10-CM | POA: Diagnosis not present

## 2018-05-03 DIAGNOSIS — F17200 Nicotine dependence, unspecified, uncomplicated: Secondary | ICD-10-CM | POA: Diagnosis not present

## 2018-05-03 MED ORDER — FLUTICASONE PROPIONATE 50 MCG/ACT NA SUSP: NASAL | 5 refills | Status: DC

## 2018-05-03 MED ORDER — IBUPROFEN 800 MG PO TABS: 800.0000 mg | ORAL_TABLET | Freq: Three times a day (TID) | ORAL | 0 refills | Status: DC | PRN

## 2018-05-03 MED ORDER — NICOTINE 14 MG/24HR TD PT24: 14.0000 mg | MEDICATED_PATCH | Freq: Every day | TRANSDERMAL | 0 refills | Status: DC

## 2018-05-03 NOTE — Patient Instructions (Addendum)
If you have lab work done today you will be contacted with your lab results within the next 2 weeks.  If you have not heard from [place] then please contact us. The fastest way to get your results is to register for My Chart.   IF you received an x-ray today, you will receive an invoice from [facility]. Please contact [facility] Radiology at [phone] with questions or concerns regarding your invoice.   IF you received labwork today, you will receive an invoice from [place]. Please contact [facility] at [phone] with questions or concerns regarding your invoice.   Our billing staff will not be able to assist you with questions regarding bills from these companies.  You will be contacted with the lab results as soon as they are available. The fastest way to get your results is to activate your My Chart account. Instructions are located on the last page of this paperwork. If you have not heard from [place] regarding the results in 2 weeks, please contact this office.      Dental Pain Dental pain may be caused by many things, including:  Tooth decay (cavities or caries).  Infection.  The inner part of the tooth being filled with pus (abscess).  Injury. Sometimes the cause of pain is unknown. Your pain can vary. It may be mild or severe. You may have it all the time, or it may occur only when you are:  Chewing.  Exposed to hot or cold temperature.  Eating or drinking sugary foods or beverages, such as soda or candy. Follow these instructions at home: Medicines  Take over-the-counter and prescription medicines only as told by your doctor.  If you were prescribed an antibiotic medicine, take it as told by your doctor. Do not stop taking the medicine even if you start to feel better. Eating and drinking  Do not eat foods or drinks that cause you pain. These include: ? Very hot or very cold foods or drinks. ? Sweet or sugary foods or drinks. Managing pain and  swelling   Gargle with a salt-water mixture 3-4 times a day. To make this, dissolve -1 tsp of salt in 1 cup of warm water.  If told, put ice on the painful area of your face: ? Put ice in a plastic bag. ? Place a towel between your skin and the bag. ? Leave the ice on for 20 minutes, 2-3 times a day. Brushing your teeth  Brush your teeth twice a day using a fluoride toothpaste.  Floss your teeth once a day.  Use a toothpaste made for sensitive teeth as told by your doctor.  Use a soft toothbrush. General instructions  Do not apply heat to the outside of your face.  Watch your dental pain. Let your doctor know if there are any changes.  Keep all follow-up visits as told by your doctor. This is important. Contact a doctor if:  Your pain is not relieved by medicines.  You have new symptoms.  Your symptoms get worse. Get help right away if:  You cannot open your mouth.  You are having trouble breathing or swallowing.  You have a fever.  Your face, neck, or jaw is swollen. Summary  Dental pain may be caused by many things, including tooth decay, injury, or infection. In some cases, the cause is not known.  Your pain may be mild or severe. You may have pain all the time, or you may have it only when you eat  or drink.  Take over-the-counter and prescription medicines only as told by your doctor.  Watch your dental pain for any changes. Let your doctor know if symptoms get worse. This information is not intended to replace advice given to you by your health care provider. Make sure you discuss any questions you have with your health care provider. Document Released: 09/10/2007 Document Revised: 04/06/2017 Document Reviewed: 04/06/2017 Elsevier Interactive Patient Education  2019 [employer].
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
## 2018-05-04 ENCOUNTER — Ambulatory Visit: Payer: Medicare Other | Admitting: Emergency Medicine

## 2018-05-10 ENCOUNTER — Ambulatory Visit: Payer: Medicare Other | Admitting: Pediatrics

## 2018-05-10 ENCOUNTER — Ambulatory Visit: Payer: Medicare Other | Admitting: Emergency Medicine

## 2018-05-10 DIAGNOSIS — R220 Localized swelling, mass and lump, head: Secondary | ICD-10-CM | POA: Diagnosis not present

## 2018-05-11 ENCOUNTER — Telehealth: Payer: Self-pay | Admitting: Family Medicine

## 2018-05-11 ENCOUNTER — Encounter: Payer: Self-pay | Admitting: Emergency Medicine

## 2018-05-11 NOTE — Telephone Encounter (Signed)
Copied from [place]. Topic: Referral - Request for Referral >> May 11, 2018 11:19 AM Reyne Dumas L wrote: Has patient seen PCP for this complaint?  Yes - states she has also been seen at [facility] *If NO, is insurance requiring patient see PCP for this issue before PCP can refer them? Referral for which specialty: bone doctor Preferred provider/office: unknown Reason for referral: osteomyelitis - bone biopsy  Pt states this needs to be an urgent referral for her.  Pt states she also wants to speak with practice administrator, Corene Cornea, about this. Pt can be reached at [phone]

## 2018-05-12 ENCOUNTER — Other Ambulatory Visit: Payer: Self-pay | Admitting: *Deleted

## 2018-05-12 DIAGNOSIS — G8929 Other chronic pain: Secondary | ICD-10-CM

## 2018-05-12 DIAGNOSIS — R220 Localized swelling, mass and lump, head: Secondary | ICD-10-CM

## 2018-05-12 DIAGNOSIS — K089 Disorder of teeth and supporting structures, unspecified: Secondary | ICD-10-CM

## 2018-05-12 DIAGNOSIS — R519 Headache, unspecified: Secondary | ICD-10-CM

## 2018-05-12 DIAGNOSIS — R51 Headache: Principal | ICD-10-CM

## 2018-05-12 NOTE — Telephone Encounter (Signed)
Send referral to [facility] please. Thanks.

## 2018-05-12 NOTE — Telephone Encounter (Signed)
Catherine Olsen,  I spoke with Dr. Mitchel Honour, and a referral was put in for infectious disease, he stated the patient does not need to be squeezed into his schedule.   She also wanted to speak to you per message below.   I have notified her through my chart about this.

## 2018-05-13 ENCOUNTER — Other Ambulatory Visit: Payer: Self-pay | Admitting: *Deleted

## 2018-05-13 ENCOUNTER — Telehealth: Payer: Self-pay | Admitting: *Deleted

## 2018-05-13 DIAGNOSIS — R51 Headache: Secondary | ICD-10-CM

## 2018-05-13 DIAGNOSIS — G8929 Other chronic pain: Secondary | ICD-10-CM

## 2018-05-13 DIAGNOSIS — M272 Inflammatory conditions of jaws: Secondary | ICD-10-CM

## 2018-05-13 NOTE — Progress Notes (Signed)
Referral put in.

## 2018-05-17 DIAGNOSIS — M279 Disease of jaws, unspecified: Secondary | ICD-10-CM | POA: Diagnosis not present

## 2018-05-17 NOTE — Telephone Encounter (Signed)
error 

## 2018-05-19 ENCOUNTER — Other Ambulatory Visit: Payer: Self-pay | Admitting: Emergency Medicine

## 2018-05-19 DIAGNOSIS — R220 Localized swelling, mass and lump, head: Secondary | ICD-10-CM

## 2018-05-19 NOTE — Telephone Encounter (Signed)
Requested medication (s) are due for refill today -yes-if to continue  Requested medication (s) are on the active medication list -yes  Future visit scheduled -yes  Last refill: 05/03/18  Notes to clinic: Patient got short term Rx for acute problem- sent for refill review by PCP  Requested Prescriptions  Pending Prescriptions Disp Refills   ibuprofen (ADVIL,MOTRIN) 800 MG tablet [Pharmacy Med Name: IBUPROFEN 800MG  TABLETS] 30 tablet 0    Sig: TAKE 1 TABLET(800 MG) BY MOUTH EVERY 8 HOURS AS NEEDED     Analgesics:  NSAIDS Passed - 05/19/2018 11:37 AM      Passed - Cr in normal range and within 360 days    Creat  Date Value Ref Range Status  02/08/2015 0.72 0.50 - 1.10 mg/dL Final   Creatinine, Ser  Date Value Ref Range Status  12/31/2017 0.70 0.44 - 1.00 mg/dL Final         Passed - HGB in normal range and within 360 days    Hemoglobin  Date Value Ref Range Status  12/31/2017 14.9 12.0 - 15.0 g/dL Final  10/29/2017 11.3 11.1 - 15.9 g/dL Final         Passed - Patient is not pregnant      Passed - Valid encounter within last 12 months    Recent Outpatient Visits          2 weeks ago Facial swelling   [facility] at [place], [place], MD   1 month ago Localized swelling, mass, and lump of head   Primary Care at [facility], Ines Bloomer, MD   1 month ago Facial swelling   [facility] at [facility] Ines Bloomer, MD   3 months ago Facial swelling   Primary Care at [facility], Ines Bloomer, MD   3 months ago Toothache   Primary [facility] at [place], Ines Bloomer, MD      Future Appointments            In 2 weeks Charlies Silvers, MD [facility] and [place]   In 3 weeks Sagardia, [place], MD Primary Care at [place], [facility]            Requested Prescriptions  Pending Prescriptions Disp Refills   ibuprofen (ADVIL,MOTRIN) 800 MG tablet [Pharmacy Med Name: IBUPROFEN 800MG  TABLETS] 30 tablet 0    Sig: TAKE 1 TABLET(800 MG) BY  MOUTH EVERY 8 HOURS AS NEEDED     Analgesics:  NSAIDS Passed - 05/19/2018 11:37 AM      Passed - Cr in normal range and within 360 days    Creat  Date Value Ref Range Status  02/08/2015 0.72 0.50 - 1.10 mg/dL Final   Creatinine, Ser  Date Value Ref Range Status  12/31/2017 0.70 0.44 - 1.00 mg/dL Final         Passed - HGB in normal range and within 360 days    Hemoglobin  Date Value Ref Range Status  12/31/2017 14.9 12.0 - 15.0 g/dL Final  10/29/2017 11.3 11.1 - 15.9 g/dL Final         Passed - Patient is not pregnant      Passed - Valid encounter within last 12 months    Recent Outpatient Visits          2 weeks ago Facial swelling   [facility] at [place], [place], MD   1 month ago Localized swelling, mass, and lump of head   Primary Care at  [place] Horald Pollen, MD   1 month ago Facial swelling   Primary Care at [facility], Ines Bloomer, MD   3 months ago Facial swelling   Primary Care at [facility], Ines Bloomer, MD   3 months ago Toothache   Primary Care at [facility], Ines Bloomer, MD      Future Appointments            In 2 weeks Bardelas, Jens Som, MD Allergy and [place]   In 3 weeks Sagardia, Ines Bloomer, MD Primary Care at [place], [facility]

## 2018-05-21 DIAGNOSIS — S93492A Sprain of other ligament of left ankle, initial encounter: Secondary | ICD-10-CM | POA: Diagnosis not present

## 2018-05-24 ENCOUNTER — Telehealth: Payer: Self-pay | Admitting: Family Medicine

## 2018-05-24 NOTE — Telephone Encounter (Signed)
Copied from [place] [id number]. Topic: Quick Communication - See Telephone Encounter >> May 24, 2018  6:04 PM Blase Mess A wrote: CRM for notification. See Telephone encounter for: 05/24/18.  2nd read of MRI results will released to Dr. Mitchel Honour. Patient is wanting the summary on paper.Patient is unable to wait until 06/11/18 appt. Please advise [phone]

## 2018-05-24 NOTE — Telephone Encounter (Signed)
Copied from [place] [id number]. Topic: Quick Communication - See Telephone Encounter >> May 24, 2018  6:01 PM Blase Mess A wrote: CRM for notification. See Telephone encounter for: 05/24/18.  Patient is calling back to speak to [facility] regarding a referral for Oral Maxillofacial Surgery Please advise [phone]

## 2018-05-25 NOTE — Telephone Encounter (Signed)
Please advise 

## 2018-05-25 NOTE — Telephone Encounter (Signed)
Called and Left patient a message that the referral was placed on 05/13/18 and that Corene Cornea spoke with them on 05/25/2018 and they will be calling her to make the appointment.

## 2018-05-26 ENCOUNTER — Encounter: Payer: Self-pay | Admitting: Emergency Medicine

## 2018-05-26 ENCOUNTER — Ambulatory Visit: Payer: Medicare Other | Admitting: Internal Medicine

## 2018-05-27 ENCOUNTER — Other Ambulatory Visit: Payer: Self-pay | Admitting: *Deleted

## 2018-05-27 DIAGNOSIS — R51 Headache: Principal | ICD-10-CM

## 2018-05-27 DIAGNOSIS — R519 Headache, unspecified: Secondary | ICD-10-CM

## 2018-05-27 NOTE — Progress Notes (Signed)
Pain management referral sent

## 2018-05-31 ENCOUNTER — Telehealth: Payer: Self-pay | Admitting: Emergency Medicine

## 2018-05-31 ENCOUNTER — Encounter: Payer: Self-pay | Admitting: Emergency Medicine

## 2018-05-31 ENCOUNTER — Ambulatory Visit (INDEPENDENT_AMBULATORY_CARE_PROVIDER_SITE_OTHER): Payer: Medicare Other | Admitting: Emergency Medicine

## 2018-05-31 ENCOUNTER — Other Ambulatory Visit: Payer: Self-pay

## 2018-05-31 VITALS — BP 115/75 | HR 80 | Temp 98.0°F | Resp 16 | Wt 137.0 lb

## 2018-05-31 DIAGNOSIS — G8929 Other chronic pain: Secondary | ICD-10-CM

## 2018-05-31 DIAGNOSIS — R51 Headache: Secondary | ICD-10-CM

## 2018-05-31 DIAGNOSIS — K089 Disorder of teeth and supporting structures, unspecified: Secondary | ICD-10-CM

## 2018-05-31 DIAGNOSIS — R519 Headache, unspecified: Secondary | ICD-10-CM

## 2018-05-31 MED ORDER — HYDROCODONE-ACETAMINOPHEN 5-325 MG PO TABS: 1.0000 | ORAL_TABLET | Freq: Four times a day (QID) | ORAL | 0 refills | Status: DC | PRN
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
## 2018-05-31 NOTE — Telephone Encounter (Signed)
Manually faxed all requested notes to [facility] 05/31/2018

## 2018-05-31 NOTE — Patient Instructions (Addendum)
If you have lab work done today you will be contacted with your lab results within the next 2 weeks.  If you have not heard from [place] then please contact us. The fastest way to get your results is to register for My Chart.   IF you received an x-ray today, you will receive an invoice from [facility]. Please contact [facility] at [phone] with questions or concerns regarding your invoice.   IF you received labwork today, you will receive an invoice from [facility]. Please contact [facility] at [phone] with questions or concerns regarding your invoice.   Our billing staff will not be able to assist you with questions regarding bills from these companies.  You will be contacted with the lab results as soon as they are available. The fastest way to get your results is to activate your My Chart account. Instructions are located on the last page of this paperwork. If you have not heard from [place] regarding the results in 2 weeks, please contact this office.      Chronic Pain, Adult Chronic pain is a type of pain that lasts or keeps coming back (recurs) for at least six months. You may have chronic headaches, abdominal pain, or body pain. Chronic pain may be related to an illness, such as fibromyalgia or complex regional pain syndrome. Sometimes the cause of chronic pain is not known. Chronic pain can make it hard for you to do daily activities. If not treated, chronic pain can lead to other health problems, including anxiety and depression. Treatment depends on the cause and severity of your pain. You may need to work with a pain specialist to come up with a treatment plan. The plan may include medicine, counseling, and physical therapy. Many people benefit from a combination of two or more types of treatment to control their pain. Follow these instructions at home: Lifestyle  Consider keeping a pain diary to share with your health care providers.  Consider talking with a  mental health care provider (psychologist) about how to cope with chronic pain.  Consider joining a chronic pain support group.  Try to control or lower your stress levels. Talk to your health care provider about strategies to do this. General instructions   Take over-the-counter and prescription medicines only as told by your health care provider.  Follow your treatment plan as told by your health care provider. This may include: ? Gentle, regular exercise. ? Eating a healthy diet that includes foods such as vegetables, fruits, fish, and lean meats. ? Cognitive or behavioral therapy. ? Working with a Community education officer. ? Meditation or yoga. ? Acupuncture or massage therapy. ? Aroma, color, light, or sound therapy. ? Local electrical stimulation. ? Shots (injections) of numbing or pain-relieving medicines into the spine or the area of pain.  Check your pain level as told by your health care provider. Ask your health care provider if you should use a pain scale.  Learn as much as you can about how to manage your chronic pain. Ask your health care provider if an intensive pain rehabilitation program or a chronic pain specialist would be helpful.  Keep all follow-up visits as told by your health care provider. This is important. Contact a health care provider if:  Your pain gets worse.  You have new pain.  You have trouble sleeping.  You have trouble doing your normal activities.  Your pain is not controlled with treatment.  Your have side effects from pain medicine.  You feel weak. Get help right away if:  You lose feeling or have numbness in your body.  You lose control of bowel or bladder function.  Your pain suddenly gets much worse.  You develop shaking or chills.  You develop confusion.  You develop chest pain.  You have trouble breathing or shortness of breath.  You pass out.  You have thoughts about hurting yourself or others. This information is not  intended to replace advice given to you by your health care provider. Make sure you discuss any questions you have with your health care provider. Document Released: 12/14/2001 Document Revised: 11/22/2015 Document Reviewed: 09/11/2015 Elsevier Interactive Patient Education  2019 [employer].

## 2018-05-31 NOTE — Telephone Encounter (Signed)
Called [facility] spoke to [place] who transferred me to the New Referrals coordinator , I had to leave a message for her to return my call I was making sure they had all of her paperwork 05/31/2018

## 2018-06-08 ENCOUNTER — Ambulatory Visit: Payer: Medicare Other | Admitting: Family Medicine

## 2018-06-09 ENCOUNTER — Ambulatory Visit: Payer: Medicare Other | Admitting: Internal Medicine

## 2018-06-09 ENCOUNTER — Emergency Department (HOSPITAL_COMMUNITY)
Admission: EM | Admit: 2018-06-09 | Discharge: 2018-06-09 | Discharge status: Against Medical Advice | Payer: Medicare Other | Attending: Emergency Medicine | Admitting: Emergency Medicine

## 2018-06-09 ENCOUNTER — Encounter (HOSPITAL_COMMUNITY): Payer: Self-pay | Admitting: Student

## 2018-06-09 ENCOUNTER — Other Ambulatory Visit: Payer: Self-pay

## 2018-06-09 DIAGNOSIS — Z79899 Other long term (current) drug therapy: Secondary | ICD-10-CM | POA: Diagnosis not present

## 2018-06-09 DIAGNOSIS — Z0441 Encounter for examination and observation following alleged adult rape: Secondary | ICD-10-CM | POA: Insufficient documentation

## 2018-06-09 DIAGNOSIS — F17210 Nicotine dependence, cigarettes, uncomplicated: Secondary | ICD-10-CM | POA: Insufficient documentation

## 2018-06-09 DIAGNOSIS — F419 Anxiety disorder, unspecified: Secondary | ICD-10-CM | POA: Diagnosis not present

## 2018-06-09 DIAGNOSIS — J45909 Unspecified asthma, uncomplicated: Secondary | ICD-10-CM | POA: Insufficient documentation

## 2018-06-09 DIAGNOSIS — R451 Restlessness and agitation: Secondary | ICD-10-CM | POA: Diagnosis not present

## 2018-06-09 DIAGNOSIS — N939 Abnormal uterine and vaginal bleeding, unspecified: Secondary | ICD-10-CM | POA: Diagnosis not present
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
## 2018-06-09 NOTE — Discharge Instructions (Addendum)
Is extremely important that you follow-up with your primary care provider within the next 24 hours to obtain labs to test you for STDs/hepatitis.  You may return to the emergency department anytime for this testing and for prophylactic treatment against gonorrhea, chlamydia, and trichomoniasis  Please return anytime for any concern you may have or if you wish to be reevaluated or to receive any the medicines that we discussed while you are in the ER.
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
## 2018-06-10 ENCOUNTER — Ambulatory Visit: Payer: Medicare Other | Admitting: Emergency Medicine

## 2018-06-10 DIAGNOSIS — F4312 Post-traumatic stress disorder, chronic: Secondary | ICD-10-CM | POA: Diagnosis not present

## 2018-06-10 DIAGNOSIS — F3132 Bipolar disorder, current episode depressed, moderate: Secondary | ICD-10-CM | POA: Diagnosis not present

## 2018-06-11 ENCOUNTER — Ambulatory Visit: Payer: Medicare Other | Admitting: Emergency Medicine

## 2018-06-11 ENCOUNTER — Telehealth: Payer: Self-pay | Admitting: *Deleted
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
## 2018-06-13 ENCOUNTER — Encounter: Payer: Self-pay | Admitting: Emergency Medicine

## 2018-06-14 DIAGNOSIS — Z114 Encounter for screening for human immunodeficiency virus [HIV]: Secondary | ICD-10-CM | POA: Diagnosis not present

## 2018-06-14 DIAGNOSIS — Z113 Encounter for screening for infections with a predominantly sexual mode of transmission: Secondary | ICD-10-CM | POA: Diagnosis not present

## 2018-06-15 ENCOUNTER — Ambulatory Visit: Payer: Medicare Other | Admitting: Pediatrics

## 2018-06-16 ENCOUNTER — Inpatient Hospital Stay: Payer: Medicare Other | Admitting: Emergency Medicine

## 2018-06-16 DIAGNOSIS — R102 Pelvic and perineal pain: Secondary | ICD-10-CM | POA: Diagnosis not present

## 2018-06-16 DIAGNOSIS — N83292 Other ovarian cyst, left side: Secondary | ICD-10-CM | POA: Diagnosis not present

## 2018-06-21 DIAGNOSIS — M47817 Spondylosis without myelopathy or radiculopathy, lumbosacral region: Secondary | ICD-10-CM | POA: Diagnosis not present

## 2018-06-21 DIAGNOSIS — M545 Low back pain: Secondary | ICD-10-CM | POA: Diagnosis not present

## 2018-06-22 DIAGNOSIS — F4312 Post-traumatic stress disorder, chronic: Secondary | ICD-10-CM | POA: Diagnosis not present

## 2018-06-22 DIAGNOSIS — F312 Bipolar disorder, current episode manic severe with psychotic features: Secondary | ICD-10-CM | POA: Diagnosis not present

## 2018-06-25 DIAGNOSIS — Z114 Encounter for screening for human immunodeficiency virus [HIV]: Secondary | ICD-10-CM | POA: Diagnosis not present

## 2018-06-25 DIAGNOSIS — Z113 Encounter for screening for infections with a predominantly sexual mode of transmission: Secondary | ICD-10-CM | POA: Diagnosis not present

## 2018-06-28 DIAGNOSIS — F312 Bipolar disorder, current episode manic severe with psychotic features: Secondary | ICD-10-CM | POA: Diagnosis not present

## 2018-06-28 DIAGNOSIS — F4312 Post-traumatic stress disorder, chronic: Secondary | ICD-10-CM | POA: Diagnosis not present

## 2018-07-01 DIAGNOSIS — F312 Bipolar disorder, current episode manic severe with psychotic features: Secondary | ICD-10-CM | POA: Diagnosis not present

## 2018-07-01 DIAGNOSIS — F4312 Post-traumatic stress disorder, chronic: Secondary | ICD-10-CM | POA: Diagnosis not present

## 2018-07-05 DIAGNOSIS — F4312 Post-traumatic stress disorder, chronic: Secondary | ICD-10-CM | POA: Diagnosis not present

## 2018-07-05 DIAGNOSIS — F312 Bipolar disorder, current episode manic severe with psychotic features: Secondary | ICD-10-CM | POA: Diagnosis not present

## 2018-07-07 ENCOUNTER — Encounter: Payer: Self-pay | Admitting: Emergency Medicine

## 2018-07-10 ENCOUNTER — Other Ambulatory Visit: Payer: Self-pay | Admitting: Emergency Medicine

## 2018-07-10 DIAGNOSIS — R220 Localized swelling, mass and lump, head: Secondary | ICD-10-CM

## 2018-07-10 NOTE — Telephone Encounter (Signed)
Same story as before.  MRI requested again and referral to maxillofacial surgeon placed again.  Please do not schedule appointment with [place] now as it is not needed.  She needs to follow-up with the specialist who will be able to provide definitive care to this chronic problem.  Thanks.

## 2018-07-11 ENCOUNTER — Encounter: Payer: Self-pay | Admitting: Emergency Medicine

## 2018-07-12 ENCOUNTER — Telehealth: Payer: Medicare Other | Admitting: Emergency Medicine

## 2018-07-12 ENCOUNTER — Other Ambulatory Visit: Payer: Self-pay | Admitting: Emergency Medicine

## 2018-07-12 ENCOUNTER — Telehealth: Payer: Self-pay | Admitting: Emergency Medicine

## 2018-07-12 ENCOUNTER — Encounter: Payer: Self-pay | Admitting: Emergency Medicine

## 2018-07-12 DIAGNOSIS — R220 Localized swelling, mass and lump, head: Secondary | ICD-10-CM

## 2018-07-12 DIAGNOSIS — F4312 Post-traumatic stress disorder, chronic: Secondary | ICD-10-CM | POA: Diagnosis not present

## 2018-07-12 DIAGNOSIS — R519 Headache, unspecified: Secondary | ICD-10-CM

## 2018-07-12 DIAGNOSIS — G8929 Other chronic pain: Secondary | ICD-10-CM

## 2018-07-12 DIAGNOSIS — F312 Bipolar disorder, current episode manic severe with psychotic features: Secondary | ICD-10-CM | POA: Diagnosis not present

## 2018-07-12 DIAGNOSIS — R51 Headache: Secondary | ICD-10-CM

## 2018-07-12 NOTE — Telephone Encounter (Signed)
Referral placed  Thanks!

## 2018-07-12 NOTE — Telephone Encounter (Signed)
Patient states she needs a referral to the provider Carmelia Roller  at [facility] provider  Has to say suspicion of osteomylitis  [phone]

## 2018-07-14 ENCOUNTER — Other Ambulatory Visit: Payer: Self-pay

## 2018-07-14 ENCOUNTER — Encounter: Payer: Self-pay | Admitting: Emergency Medicine

## 2018-07-14 ENCOUNTER — Ambulatory Visit
Admission: RE | Admit: 2018-07-14 | Discharge: 2018-07-14 | Disposition: A | Discharge status: Home / Self Care | Payer: Medicare Other | Source: Ambulatory Visit | Attending: Emergency Medicine | Admitting: Emergency Medicine

## 2018-07-14 ENCOUNTER — Telehealth: Payer: Self-pay | Admitting: Emergency Medicine

## 2018-07-14 DIAGNOSIS — R220 Localized swelling, mass and lump, head: Secondary | ICD-10-CM | POA: Diagnosis not present

## 2018-07-14 MED ORDER — GADOBENATE DIMEGLUMINE 529 MG/ML IV SOLN
12.0000 mL | Freq: Once | INTRAVENOUS | Status: AC | PRN
  Administered 2018-07-14: 12 mL via INTRAVENOUS

## 2018-07-14 NOTE — Telephone Encounter (Signed)
Pt called in very upset requesting to speak with the office directly in regards to her referral. Pt says that she was advised by [facility]  that due to the incorrect information being submitted to them, they are unable to see pt.   Called the office and was advised that someone would call pt back to discuss further.    When went back to pt to advise, pt had already hung up.
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
## 2018-07-16 NOTE — Telephone Encounter (Signed)
Per Catherine Olsen was told to sent to you.

## 2018-07-19 ENCOUNTER — Telehealth: Payer: Medicare Other | Admitting: Emergency Medicine

## 2018-07-19 DIAGNOSIS — F312 Bipolar disorder, current episode manic severe with psychotic features: Secondary | ICD-10-CM | POA: Diagnosis not present

## 2018-07-19 DIAGNOSIS — F4312 Post-traumatic stress disorder, chronic: Secondary | ICD-10-CM | POA: Diagnosis not present

## 2018-07-20 ENCOUNTER — Telehealth: Payer: Medicare Other | Admitting: Emergency Medicine

## 2018-07-26 DIAGNOSIS — F4312 Post-traumatic stress disorder, chronic: Secondary | ICD-10-CM | POA: Diagnosis not present

## 2018-07-26 DIAGNOSIS — F312 Bipolar disorder, current episode manic severe with psychotic features: Secondary | ICD-10-CM | POA: Diagnosis not present

## 2018-07-28 DIAGNOSIS — M79672 Pain in left foot: Secondary | ICD-10-CM | POA: Diagnosis not present

## 2018-07-28 DIAGNOSIS — M25572 Pain in left ankle and joints of left foot: Secondary | ICD-10-CM | POA: Diagnosis not present

## 2018-08-02 DIAGNOSIS — F312 Bipolar disorder, current episode manic severe with psychotic features: Secondary | ICD-10-CM | POA: Diagnosis not present

## 2018-08-02 DIAGNOSIS — F4312 Post-traumatic stress disorder, chronic: Secondary | ICD-10-CM | POA: Diagnosis not present

## 2018-08-04 ENCOUNTER — Other Ambulatory Visit: Payer: Self-pay

## 2018-08-04 ENCOUNTER — Emergency Department (HOSPITAL_COMMUNITY)
Admission: EM | Admit: 2018-08-04 | Discharge: 2018-08-04 | Disposition: A | Discharge status: Home / Self Care | Payer: Medicare Other | Attending: Emergency Medicine | Admitting: Emergency Medicine

## 2018-08-04 ENCOUNTER — Encounter (HOSPITAL_COMMUNITY): Payer: Self-pay | Admitting: *Deleted

## 2018-08-04 DIAGNOSIS — F419 Anxiety disorder, unspecified: Secondary | ICD-10-CM | POA: Diagnosis not present

## 2018-08-04 DIAGNOSIS — Z79899 Other long term (current) drug therapy: Secondary | ICD-10-CM | POA: Insufficient documentation

## 2018-08-04 DIAGNOSIS — J449 Chronic obstructive pulmonary disease, unspecified: Secondary | ICD-10-CM | POA: Insufficient documentation

## 2018-08-04 DIAGNOSIS — R220 Localized swelling, mass and lump, head: Secondary | ICD-10-CM | POA: Insufficient documentation

## 2018-08-04 DIAGNOSIS — F17210 Nicotine dependence, cigarettes, uncomplicated: Secondary | ICD-10-CM | POA: Diagnosis not present

## 2018-08-04 DIAGNOSIS — J45909 Unspecified asthma, uncomplicated: Secondary | ICD-10-CM | POA: Diagnosis not present

## 2018-08-04 DIAGNOSIS — F319 Bipolar disorder, unspecified: Secondary | ICD-10-CM | POA: Insufficient documentation

## 2018-08-04 NOTE — Discharge Instructions (Signed)
However, today's evaluation has been reassuring. It is important that you complete your current course of antibiotics and follow-up with your physician. If you do develop new, or concerning changes, please be sure to follow-up sooner with your physician or return here.

## 2018-08-04 NOTE — ED Triage Notes (Signed)
Pt in requesting eval for facial pain, pt states, "I have seen a orthodontist." pt states, "I am taking Clindomycin and have an appointment with infectious disease later in May." A&O x4
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
## 2018-08-09 DIAGNOSIS — F4312 Post-traumatic stress disorder, chronic: Secondary | ICD-10-CM | POA: Diagnosis not present

## 2018-08-09 DIAGNOSIS — F312 Bipolar disorder, current episode manic severe with psychotic features: Secondary | ICD-10-CM | POA: Diagnosis not present

## 2018-08-10 ENCOUNTER — Encounter: Payer: Self-pay | Admitting: Infectious Diseases

## 2018-08-10 ENCOUNTER — Other Ambulatory Visit: Payer: Self-pay

## 2018-08-10 ENCOUNTER — Ambulatory Visit (INDEPENDENT_AMBULATORY_CARE_PROVIDER_SITE_OTHER): Payer: Medicare Other | Admitting: Infectious Diseases

## 2018-08-10 VITALS — BP 142/91 | HR 79 | Temp 98.3°F | Wt 137.0 lb

## 2018-08-10 DIAGNOSIS — Z720 Tobacco use: Secondary | ICD-10-CM

## 2018-08-10 DIAGNOSIS — M6020 Foreign body granuloma of soft tissue, not elsewhere classified, unspecified site: Secondary | ICD-10-CM

## 2018-08-10 DIAGNOSIS — R591 Generalized enlarged lymph nodes: Secondary | ICD-10-CM

## 2018-08-10 NOTE — Progress Notes (Signed)
Subjective:    Patient ID: Catherine Olsen, female    DOB: 12-23-1970, 48 y.o.   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  Nearly 25 years ago, the patient had malar plating and cheek implants placed for cosmetic presumed cosmetic purposes.  The patient is rather evasive as to whether she had a prior injury requiring the surgery or whether she was doing this to achieve a "certain look."  She denies any issues related to her face until approximately 18 months ago when she began developing facial/cheek swelling that was initially worse on the left than the right.  Per her report, this all began when she allowed her regular dentist to perform a root canal on her and then "nothing has been the same."  She received 2 courses of oral Levaquin without response and then was given oral clindamycin again without response.  Ultimately, she was evaluated by Dr. Sue Lush and endodontist who performed an extraction of a tooth and placed her on oral doxycycline post procedure.  As the patient is continued to complain of paresthesias and swelling, a bone biopsy was performed in February of this year to further evaluate.  The results of this biopsy is not available to me today but per the patient the culture was negative.  A facial MRI was performed last month showing persistent indolent soft tissue enhancement lateral to the lateral root of tooth #12 consistent with chronic infection.  No dental abscess was observed but she also was found to have mucosal soft tissue swelling along the lateral buccal surface of the left alveolar ridge of the maxilla and decreased marrow signal within the left maxilla that was felt to be likely reactive.  Patient is now been referred to our service for potential concern for maxillary osteomyelitis.  She denies any intraoral drainage at present and  denies any systemic fevers or chills.  She does describe swelling to the face and erythema that is not appreciated by other providers nor myself at today's visit.   Past Medical History:  Diagnosis Date   Abnormal uterine bleeding (AUB) 06/25/2009   Qualifier: Diagnosis of  By: Cathren Laine MD, Ankit     Allergy    Anxiety    Arthritis    hands, lower back, knees   Asthma    Bipolar 1 disorder ([facility])    COPD (chronic obstructive pulmonary disease) ([facility])    Depression    Endometriosis    Fibromyalgia    Headache(784.0)    otc med prn   Migraine    Pituitary tumor    microadenoma   PONV (postoperative nausea and vomiting)    PTSD (post-traumatic stress disorder)    Scoliosis    Termination of pregnancy    x 2 at age 32 and 48 yrs old   Tobacco abuse 03/04/2015   Family History  Problem Relation Age of Onset   Diabetes Mother    Hypertension Mother    Stroke Mother 44       CVA   Mental illness Mother        no diagnosis; personality disorder   Hyperlipidemia Father    Hypertension Father    COPD Father    Alpha-1 antitrypsin deficiency Father    Asthma Father    Alpha-1 antitrypsin deficiency Brother    Asthma Brother    Diabetes Maternal Grandmother    Heart  disease Maternal Grandmother    Hyperlipidemia Maternal Grandmother    Hypertension Maternal Grandmother    Mental illness Maternal Grandmother    Heart disease Maternal Grandfather    Hyperlipidemia Maternal Grandfather    Hypertension Maternal Grandfather    Asthma Maternal Grandfather    Heart disease Paternal Grandfather    Hyperlipidemia Paternal Grandfather    Hypertension Paternal Grandfather    Stroke Paternal Grandfather    Alzheimer's disease Paternal Grandmother    Allergic rhinitis Neg Hx    Angioedema Neg Hx    Eczema Neg Hx    Urticaria Neg Hx    Immunodeficiency Neg Hx     Social History   Tobacco Use   Smoking status: Current Some Day Smoker      Packs/day: 2.00    Years: 29.00    Pack years: 58.00    Types: Cigarettes   Smokeless tobacco: Never Used  Substance Use Topics   Alcohol use: Not Currently   Drug use: Yes    Comment: Heavy user per patient, last use Tuesday 11/08/13  LAST CRACK  2015   Review of Systems  Constitutional: Positive for fatigue. Negative for chills and fever.  HENT: Positive for congestion, dental problem, facial swelling, sinus pressure and trouble swallowing. Negative for hearing loss and sore throat.   Eyes: Negative for photophobia, discharge, redness and visual disturbance.  Respiratory: Negative for apnea, cough, shortness of breath and wheezing.   Cardiovascular: Negative for chest pain and leg swelling.  Gastrointestinal: Negative for abdominal distention, abdominal pain, constipation, diarrhea, nausea and vomiting.  Endocrine: Negative for cold intolerance, heat intolerance, polydipsia and polyuria.  Genitourinary: Negative for dysuria, flank pain, frequency, urgency, vaginal bleeding and vaginal discharge.  Musculoskeletal: Negative for arthralgias, back pain, joint swelling and neck pain.  Skin: Negative for pallor and rash.  Allergic/Immunologic: Negative for immunocompromised state.  Neurological: Negative for dizziness, seizures, speech difficulty, weakness and headaches.  Hematological: Does not bruise/bleed easily.  Psychiatric/Behavioral: Negative for agitation, confusion, hallucinations and sleep disturbance. The patient is nervous/anxious.    Vitals:   08/10/18 1612  BP: (!) 142/91  Pulse: 79  Temp: 98.3 F (36.8 C)      Objective:    Physical Exam Gen: pleasant/energetic, appears older than stated age, NAD, A&Ox 3 Head: NCAT, no temporal wasting evident EENT: PERRL, EOMI, MMM, adequate dentition, no facial swelling or erythema noted along buccal or external surfaces Neck: supple, no JVD CV: NRRR, no murmurs evident Pulm: CTA bilaterally, no wheeze or retractions Abd:  soft, NTND, +BS Extrems: trace LE edema, 2+ pulses Skin: no rashes, poor skin turgor Neuro: CN II-XII grossly intact, no focal neurologic deficits appreciated, gait was not assessed, A&Ox 3  CBC Latest Ref Rng & Units 12/31/2017 10/29/2017 05/04/2016  WBC 4.0 - 10.5 K/uL 6.2 5.6 -  Hemoglobin 12.0 - 15.0 g/dL 14.9 11.3 15.6(H)  Hematocrit 36.0 - 46.0 % 44.8 35.1 46.0  Platelets 150 - 400 K/uL 264 278 -     Chemistry      Component Value Date/Time   NA 135 12/31/2017 0855   NA 139 10/29/2017 1707   K 3.4 (L) 12/31/2017 0855   CL 99 12/31/2017 0855   CO2 24 12/31/2017 0855   BUN <5 (L) 12/31/2017 0855   BUN 8 10/29/2017 1707   CREATININE 0.70 12/31/2017 0855   CREATININE 0.72 02/08/2015 1358      Component Value Date/Time   CALCIUM 9.0 12/31/2017 0855   ALKPHOS 53 12/31/2017 0855  AST 19 12/31/2017 0855   ALT 15 12/31/2017 0855   BILITOT 0.8 12/31/2017 0855   BILITOT 0.3 10/29/2017 1707       MRI face with and without contrast on 07/14/18: IMPRESSION: 1. Persistent indolent soft tissue enhancement lateral to the lateral root of tooth #12, consistent with chronic infection. Intermittent symptoms likely reflect some response to the antibiotics. No definite dental disease was identified in this tooth on the previous CT scan. The root is likely exposed. 2. Mucosal soft tissue swelling along the lateral buccal surface of the left alveolar ridge of the maxilla. 3. Decreased marrow signal abnormality within the left maxilla, likely reactive. 4. No discrete abscess or mass lesion. 5. No soft tissue mass lesion or evidence for malignancy. 6. No inflammatory changes are associated with the implants anterior to the maxilla.  Electronically Signed   By: San Morelle M.D.   On: 07/19/2018 08:50 Assessment & Plan:  The patient is a 48 year old white female with COPD/tobacco abuse, fibromyalgia and history of malar implants now presenting with concern for maxillary  osteomyelitis.   Possible maxillary osteomyelitis- objective evidence for this condition is lacking at present, particularly as her most recent MRI failed to show either an abscess or focal changes to the bone beyond just reactive inflammation.  These findings may very well be from the patient's recent dental process and procedures.  If concern for maxillary osteomyelitis remains, I would recommend holding all antibiotics and steroids for a minimum of 3 weeks. If swelling and drainage return, have endodontist or plastic surgery perform biopsy and culture of implant.  He was also advised to stop smoking cigarettes as this decreases her bone density and makes her more prone to develop infections particularly in the oropharynx.

## 2018-08-10 NOTE — Patient Instructions (Addendum)
Hold all antibiotics and steroids for a minimum of 3 weeks. If swelling and drainage return, have endodontist or plastic surgery perform biopsy and culture of implant. Stop smoking cigarettes.

## 2018-08-11 ENCOUNTER — Ambulatory Visit: Payer: Medicare Other | Admitting: Infectious Diseases

## 2018-08-12 DIAGNOSIS — F4312 Post-traumatic stress disorder, chronic: Secondary | ICD-10-CM | POA: Diagnosis not present

## 2018-08-12 DIAGNOSIS — F3112 Bipolar disorder, current episode manic without psychotic features, moderate: Secondary | ICD-10-CM | POA: Diagnosis not present

## 2018-08-13 DIAGNOSIS — F4312 Post-traumatic stress disorder, chronic: Secondary | ICD-10-CM | POA: Diagnosis not present

## 2018-08-13 DIAGNOSIS — F3112 Bipolar disorder, current episode manic without psychotic features, moderate: Secondary | ICD-10-CM | POA: Diagnosis not present

## 2018-08-16 DIAGNOSIS — F4312 Post-traumatic stress disorder, chronic: Secondary | ICD-10-CM | POA: Diagnosis not present

## 2018-08-16 DIAGNOSIS — F3112 Bipolar disorder, current episode manic without psychotic features, moderate: Secondary | ICD-10-CM | POA: Diagnosis not present

## 2018-08-23 DIAGNOSIS — F4312 Post-traumatic stress disorder, chronic: Secondary | ICD-10-CM | POA: Diagnosis not present

## 2018-08-23 DIAGNOSIS — F3112 Bipolar disorder, current episode manic without psychotic features, moderate: Secondary | ICD-10-CM | POA: Diagnosis not present

## 2018-08-25 DIAGNOSIS — M25572 Pain in left ankle and joints of left foot: Secondary | ICD-10-CM | POA: Diagnosis not present

## 2018-08-25 DIAGNOSIS — M542 Cervicalgia: Secondary | ICD-10-CM | POA: Diagnosis not present

## 2018-08-25 DIAGNOSIS — M79672 Pain in left foot: Secondary | ICD-10-CM | POA: Diagnosis not present

## 2018-08-31 DIAGNOSIS — F4312 Post-traumatic stress disorder, chronic: Secondary | ICD-10-CM | POA: Diagnosis not present

## 2018-08-31 DIAGNOSIS — F3112 Bipolar disorder, current episode manic without psychotic features, moderate: Secondary | ICD-10-CM | POA: Diagnosis not present

## 2018-09-07 ENCOUNTER — Emergency Department
Admission: EM | Admit: 2018-09-07 | Discharge: 2018-09-08 | Disposition: A | Discharge status: Other Acute Inpatient Hospital | Payer: Medicare Other | Attending: Emergency Medicine | Admitting: Emergency Medicine

## 2018-09-07 ENCOUNTER — Other Ambulatory Visit: Payer: Self-pay

## 2018-09-07 DIAGNOSIS — J449 Chronic obstructive pulmonary disease, unspecified: Secondary | ICD-10-CM | POA: Insufficient documentation

## 2018-09-07 DIAGNOSIS — J45909 Unspecified asthma, uncomplicated: Secondary | ICD-10-CM | POA: Insufficient documentation

## 2018-09-07 DIAGNOSIS — F419 Anxiety disorder, unspecified: Secondary | ICD-10-CM | POA: Insufficient documentation

## 2018-09-07 DIAGNOSIS — F329 Major depressive disorder, single episode, unspecified: Secondary | ICD-10-CM | POA: Diagnosis not present

## 2018-09-07 DIAGNOSIS — Z1159 Encounter for screening for other viral diseases: Secondary | ICD-10-CM | POA: Insufficient documentation

## 2018-09-07 DIAGNOSIS — Z03818 Encounter for observation for suspected exposure to other biological agents ruled out: Secondary | ICD-10-CM | POA: Diagnosis not present

## 2018-09-07 DIAGNOSIS — F32A Depression, unspecified: Secondary | ICD-10-CM

## 2018-09-07 DIAGNOSIS — F319 Bipolar disorder, unspecified: Secondary | ICD-10-CM | POA: Insufficient documentation

## 2018-09-07 DIAGNOSIS — Z79899 Other long term (current) drug therapy: Secondary | ICD-10-CM | POA: Insufficient documentation

## 2018-09-07 DIAGNOSIS — F3112 Bipolar disorder, current episode manic without psychotic features, moderate: Secondary | ICD-10-CM | POA: Diagnosis present

## 2018-09-07 DIAGNOSIS — F17210 Nicotine dependence, cigarettes, uncomplicated: Secondary | ICD-10-CM | POA: Insufficient documentation

## 2018-09-07 LAB — URINE DRUG SCREEN, QUALITATIVE (ARMC ONLY)
Amphetamines, Ur Screen: POSITIVE — AB
Barbiturates, Ur Screen: NOT DETECTED
Benzodiazepine, Ur Scrn: POSITIVE — AB
Cannabinoid 50 Ng, Ur ~~LOC~~: NOT DETECTED
Cocaine Metabolite,Ur ~~LOC~~: POSITIVE — AB
MDMA (Ecstasy)Ur Screen: NOT DETECTED
Methadone Scn, Ur: NOT DETECTED
Opiate, Ur Screen: NOT DETECTED
Phencyclidine (PCP) Ur S: NOT DETECTED
Tricyclic, Ur Screen: NOT DETECTED

## 2018-09-07 LAB — ACETAMINOPHEN LEVEL: Acetaminophen (Tylenol), Serum: <10 ug/mL — ABNORMAL LOW (ref 10–30)

## 2018-09-07 LAB — CBC
HCT: 46.3 % — ABNORMAL HIGH (ref 36.0–46.0)
Hemoglobin: 16 g/dL — ABNORMAL HIGH (ref 12.0–15.0)
MCH: 32.5 pg (ref 26.0–34.0)
MCHC: 34.6 g/dL (ref 30.0–36.0)
MCV: 94.1 fL (ref 80.0–100.0)
Platelets: 276 10*3/uL (ref 150–400)
RBC: 4.92 MIL/uL (ref 3.87–5.11)
RDW: 13.4 % (ref 11.5–15.5)
WBC: 7.5 10*3/uL (ref 4.0–10.5)
nRBC: 0 % (ref 0.0–0.2)

## 2018-09-07 LAB — COMPREHENSIVE METABOLIC PANEL
ALT: 13 U/L (ref 0–44)
AST: 18 U/L (ref 15–41)
Albumin: 4.3 g/dL (ref 3.5–5.0)
Alkaline Phosphatase: 75 U/L (ref 38–126)
Anion gap: 9 (ref 5–15)
BUN: 18 mg/dL (ref 6–20)
CO2: 25 mmol/L (ref 22–32)
Calcium: 9 mg/dL (ref 8.9–10.3)
Chloride: 105 mmol/L (ref 98–111)
Creatinine, Ser: 0.67 mg/dL (ref 0.44–1.00)
GFR calc Af Amer: >60 mL/min (ref >60)
GFR calc non Af Amer: >60 mL/min (ref >60)
Glucose, Bld: 89 mg/dL (ref 70–99)
Potassium: 3.8 mmol/L (ref 3.5–5.1)
Sodium: 139 mmol/L (ref 135–145)
Total Bilirubin: 0.4 mg/dL (ref 0.3–1.2)
Total Protein: 6.9 g/dL (ref 6.5–8.1)

## 2018-09-07 LAB — SALICYLATE LEVEL: Salicylate Lvl: <7 mg/dL (ref 2.8–30.0)

## 2018-09-07 LAB — ETHANOL: Alcohol, Ethyl (B): <10 mg/dL (ref <10)

## 2018-09-07 MED ORDER — CLONAZEPAM 0.5 MG PO TABS: 1.0000 mg | ORAL_TABLET | Freq: Three times a day (TID) | ORAL | Status: DC | PRN

## 2018-09-07 MED ORDER — ARIPIPRAZOLE 5 MG PO TABS: 2.5000 mg | ORAL_TABLET | Freq: Every day | ORAL | Status: DC

## 2018-09-07 MED ORDER — DOXYCYCLINE HYCLATE 100 MG PO TABS: 100.0000 mg | ORAL_TABLET | Freq: Every day | ORAL | Status: DC

## 2018-09-07 MED ORDER — TEMAZEPAM 15 MG PO CAPS: 45.0000 mg | ORAL_CAPSULE | Freq: Every day | ORAL | Status: DC

## 2018-09-07 MED ORDER — BIOTENE DRY MOUTH MT LIQD: 15.0000 mL | OROMUCOSAL | Status: DC | PRN

## 2018-09-07 MED ORDER — NICOTINE 14 MG/24HR TD PT24: 14.0000 mg | MEDICATED_PATCH | Freq: Every day | TRANSDERMAL | Status: DC

## 2018-09-07 MED ORDER — IBUPROFEN 800 MG PO TABS: 800.0000 mg | ORAL_TABLET | Freq: Three times a day (TID) | ORAL | Status: DC | PRN

## 2018-09-07 MED ORDER — OXYMETAZOLINE HCL 0.05 % NA SOLN
1.0000 | Freq: Two times a day (BID) | NASAL | Status: DC
  Administered 2018-09-07: 1 via NASAL
  Filled 2018-09-07: qty 30

## 2018-09-07 NOTE — ED Notes (Signed)
Hourly rounding reveals patient in room. No complaints, stable, in no acute distress. Q15 minute rounds and monitoring via Rover and Officer to continue.   

## 2018-09-07 NOTE — ED Notes (Signed)
Pt. Transferred from Triage to room after dressing out and screening for contraband. Pt. Oriented to Quad including Q15 minute rounds as well as Engineer, drilling for their protection. Patient is alert and oriented, warm and dry in no acute distress. Patient denies HI, and AVH. Patient does endorse so pasiv SI. Pt. Encouraged to let me know if needs arise. Snack and drink given.
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
## 2018-09-07 NOTE — ED Triage Notes (Signed)
Pt states she is manic bipolar and has been having thoughts of SI, states she called ahead and is wanting admission for inpatient.pt is very vague with why she is here

## 2018-09-07 NOTE — ED Notes (Signed)
Pt. Requested my presence but was asleep when I went to her room. Will check later.

## 2018-09-07 NOTE — ED Notes (Addendum)
With this nurse & EDT Mayra present, pt removed green crop pants, grey socks, black slip-on shoes, black short-sleeve shirt, black hair tie, beige bra, beige panties--all placed in labeled pt belonging bag, also labeled brown duffle bag and paisley print purse to be secured on nursing unit and pt changed into burgandy paper scrubs
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
## 2018-09-08 ENCOUNTER — Inpatient Hospital Stay
Admission: AD | Admit: 2018-09-08 | Discharge: 2018-09-10 | DRG: 885 | Disposition: A | Discharge status: Home / Self Care | Payer: Medicare Other | Source: Intra-hospital | Attending: Psychiatry | Admitting: Psychiatry

## 2018-09-08 DIAGNOSIS — Z8349 Family history of other endocrine, nutritional and metabolic diseases: Secondary | ICD-10-CM

## 2018-09-08 DIAGNOSIS — F3112 Bipolar disorder, current episode manic without psychotic features, moderate: Secondary | ICD-10-CM | POA: Insufficient documentation

## 2018-09-08 DIAGNOSIS — Z825 Family history of asthma and other chronic lower respiratory diseases: Secondary | ICD-10-CM | POA: Diagnosis not present

## 2018-09-08 DIAGNOSIS — F1510 Other stimulant abuse, uncomplicated: Secondary | ICD-10-CM | POA: Diagnosis present

## 2018-09-08 DIAGNOSIS — F4310 Post-traumatic stress disorder, unspecified: Secondary | ICD-10-CM | POA: Diagnosis present

## 2018-09-08 DIAGNOSIS — Z823 Family history of stroke: Secondary | ICD-10-CM | POA: Diagnosis not present

## 2018-09-08 DIAGNOSIS — Z818 Family history of other mental and behavioral disorders: Secondary | ICD-10-CM | POA: Diagnosis not present

## 2018-09-08 DIAGNOSIS — Z888 Allergy status to other drugs, medicaments and biological substances status: Secondary | ICD-10-CM | POA: Diagnosis not present

## 2018-09-08 DIAGNOSIS — Z880 Allergy status to penicillin: Secondary | ICD-10-CM | POA: Diagnosis not present

## 2018-09-08 DIAGNOSIS — M159 Polyosteoarthritis, unspecified: Secondary | ICD-10-CM | POA: Diagnosis present

## 2018-09-08 DIAGNOSIS — R45851 Suicidal ideations: Secondary | ICD-10-CM | POA: Diagnosis present

## 2018-09-08 DIAGNOSIS — G4700 Insomnia, unspecified: Secondary | ICD-10-CM | POA: Diagnosis present

## 2018-09-08 DIAGNOSIS — Z820 Family history of epilepsy and other diseases of the nervous system: Secondary | ICD-10-CM | POA: Diagnosis not present

## 2018-09-08 DIAGNOSIS — Z885 Allergy status to narcotic agent status: Secondary | ICD-10-CM

## 2018-09-08 DIAGNOSIS — J449 Chronic obstructive pulmonary disease, unspecified: Secondary | ICD-10-CM | POA: Diagnosis present

## 2018-09-08 DIAGNOSIS — F3160 Bipolar disorder, current episode mixed, unspecified: Secondary | ICD-10-CM | POA: Diagnosis present

## 2018-09-08 DIAGNOSIS — Z833 Family history of diabetes mellitus: Secondary | ICD-10-CM

## 2018-09-08 DIAGNOSIS — Z79899 Other long term (current) drug therapy: Secondary | ICD-10-CM

## 2018-09-08 DIAGNOSIS — Z882 Allergy status to sulfonamides status: Secondary | ICD-10-CM | POA: Diagnosis not present

## 2018-09-08 DIAGNOSIS — Z915 Personal history of self-harm: Secondary | ICD-10-CM

## 2018-09-08 DIAGNOSIS — F3113 Bipolar disorder, current episode manic without psychotic features, severe: Principal | ICD-10-CM | POA: Diagnosis present

## 2018-09-08 DIAGNOSIS — F1390 Sedative, hypnotic, or anxiolytic use, unspecified, uncomplicated: Secondary | ICD-10-CM | POA: Diagnosis present

## 2018-09-08 DIAGNOSIS — M797 Fibromyalgia: Secondary | ICD-10-CM | POA: Diagnosis present

## 2018-09-08 DIAGNOSIS — F1410 Cocaine abuse, uncomplicated: Secondary | ICD-10-CM | POA: Diagnosis present

## 2018-09-08 DIAGNOSIS — Z8249 Family history of ischemic heart disease and other diseases of the circulatory system: Secondary | ICD-10-CM | POA: Diagnosis not present

## 2018-09-08 DIAGNOSIS — Z8659 Personal history of other mental and behavioral disorders: Secondary | ICD-10-CM

## 2018-09-08 DIAGNOSIS — Z7951 Long term (current) use of inhaled steroids: Secondary | ICD-10-CM | POA: Diagnosis not present

## 2018-09-08 DIAGNOSIS — Z136 Encounter for screening for cardiovascular disorders: Secondary | ICD-10-CM | POA: Diagnosis not present

## 2018-09-08 HISTORY — DX: Other long term (current) drug therapy: Z79.899

## 2018-09-08 LAB — SARS CORONAVIRUS 2 BY RT PCR (HOSPITAL ORDER, PERFORMED IN ~~LOC~~ HOSPITAL LAB): SARS Coronavirus 2: NEGATIVE

## 2018-09-08 MED ORDER — EPINEPHRINE 0.3 MG/0.3ML IJ SOAJ
0.3000 mg | Freq: Once | INTRAMUSCULAR | Status: DC
  Filled 2018-09-08: qty 0.6

## 2018-09-08 MED ORDER — FLUTICASONE PROPIONATE 50 MCG/ACT NA SUSP
1.0000 | Freq: Every day | NASAL | Status: DC
  Administered 2018-09-08 – 2018-09-10 (×3): 1 via NASAL
  Filled 2018-09-08 (×2): qty 16

## 2018-09-08 MED ORDER — ALUM & MAG HYDROXIDE-SIMETH 200-200-20 MG/5ML PO SUSP: 15.0000 mL | Freq: Four times a day (QID) | ORAL | Status: DC | PRN

## 2018-09-08 MED ORDER — DOXYCYCLINE HYCLATE 100 MG PO TABS
100.0000 mg | ORAL_TABLET | Freq: Every day | ORAL | Status: DC
  Administered 2018-09-08 – 2018-09-10 (×3): 100 mg via ORAL
  Filled 2018-09-08 (×3): qty 1

## 2018-09-08 MED ORDER — BIOTENE DRY MOUTH MT LIQD: 15.0000 mL | OROMUCOSAL | Status: DC | PRN

## 2018-09-08 MED ORDER — OXYMETAZOLINE HCL 0.05 % NA SOLN
1.0000 | Freq: Two times a day (BID) | NASAL | Status: DC
  Filled 2018-09-08: qty 15

## 2018-09-08 MED ORDER — IBUPROFEN 600 MG PO TABS
800.0000 mg | ORAL_TABLET | Freq: Three times a day (TID) | ORAL | Status: DC | PRN
  Administered 2018-09-08 – 2018-09-09 (×4): 800 mg via ORAL
  Filled 2018-09-08 (×4): qty 1

## 2018-09-08 MED ORDER — DIPHENHYDRAMINE HCL 25 MG PO CAPS: 50.0000 mg | ORAL_CAPSULE | Freq: Three times a day (TID) | ORAL | Status: DC | PRN

## 2018-09-08 MED ORDER — VITAMIN D 25 MCG (1000 UNIT) PO TABS
2000.0000 [IU] | ORAL_TABLET | Freq: Two times a day (BID) | ORAL | Status: DC
  Administered 2018-09-08 – 2018-09-10 (×4): 2000 [IU] via ORAL
  Filled 2018-09-08 (×4): qty 2

## 2018-09-08 MED ORDER — ARIPIPRAZOLE 5 MG PO TABS
2.5000 mg | ORAL_TABLET | Freq: Every day | ORAL | Status: DC
  Administered 2018-09-08: 2.5 mg via ORAL
  Filled 2018-09-08 (×2): qty 1

## 2018-09-08 MED ORDER — ARIPIPRAZOLE 5 MG PO TABS: 5.0000 mg | ORAL_TABLET | Freq: Every day | ORAL | Status: DC

## 2018-09-08 MED ORDER — NICOTINE 14 MG/24HR TD PT24
14.0000 mg | MEDICATED_PATCH | Freq: Every day | TRANSDERMAL | Status: DC
  Administered 2018-09-08 – 2018-09-10 (×3): 14 mg via TRANSDERMAL
  Filled 2018-09-08 (×3): qty 1

## 2018-09-08 MED ORDER — ACETAMINOPHEN 325 MG PO TABS
650.0000 mg | ORAL_TABLET | Freq: Four times a day (QID) | ORAL | Status: DC | PRN
  Administered 2018-09-08 – 2018-09-10 (×4): 650 mg via ORAL
  Filled 2018-09-08 (×4): qty 2

## 2018-09-08 MED ORDER — FAMOTIDINE 20 MG PO TABS
20.0000 mg | ORAL_TABLET | Freq: Every day | ORAL | Status: DC
  Administered 2018-09-08: 20 mg via ORAL
  Filled 2018-09-08: qty 1

## 2018-09-08 MED ORDER — MAGNESIUM GLUCONATE 500 MG PO TABS
500.0000 mg | ORAL_TABLET | Freq: Every day | ORAL | Status: DC
  Administered 2018-09-08: 500 mg via ORAL
  Filled 2018-09-08 (×2): qty 1

## 2018-09-08 MED ORDER — VITAMIN C 500 MG PO TABS
1500.0000 mg | ORAL_TABLET | Freq: Every day | ORAL | Status: DC
  Administered 2018-09-08: 09:00:00 1500 mg via ORAL
  Filled 2018-09-08: qty 3

## 2018-09-08 MED ORDER — HYDROCODONE-ACETAMINOPHEN 5-325 MG PO TABS
1.0000 | ORAL_TABLET | Freq: Four times a day (QID) | ORAL | Status: DC | PRN
  Administered 2018-09-09 – 2018-09-10 (×3): 1 via ORAL
  Filled 2018-09-08 (×7): qty 1

## 2018-09-08 MED ORDER — ALBUTEROL SULFATE HFA 108 (90 BASE) MCG/ACT IN AERS
2.0000 | INHALATION_SPRAY | RESPIRATORY_TRACT | Status: DC | PRN
  Filled 2018-09-08: qty 6.7

## 2018-09-08 MED ORDER — TEMAZEPAM 15 MG PO CAPS
45.0000 mg | ORAL_CAPSULE | Freq: Every day | ORAL | Status: DC
  Administered 2018-09-08 – 2018-09-09 (×3): 45 mg via ORAL
  Filled 2018-09-08 (×3): qty 3

## 2018-09-08 MED ORDER — ESTRADIOL 0.025 MG/24HR TD PTWK: 0.0250 mg | MEDICATED_PATCH | TRANSDERMAL | Status: DC

## 2018-09-08 MED ORDER — DOXYCYCLINE HYCLATE 100 MG PO TBEC: 100.0000 mg | DELAYED_RELEASE_TABLET | Freq: Every day | ORAL | Status: DC

## 2018-09-08 MED ORDER — ADULT MULTIVITAMIN W/MINERALS CH
1.0000 | ORAL_TABLET | Freq: Every day | ORAL | Status: DC
  Administered 2018-09-08: 08:00:00 1 via ORAL
  Filled 2018-09-08: qty 1

## 2018-09-08 MED ORDER — ALUM & MAG HYDROXIDE-SIMETH 200-200-20 MG/5ML PO SUSP: 30.0000 mL | ORAL | Status: DC | PRN

## 2018-09-08 MED ORDER — IBUPROFEN 600 MG PO TABS: 800.0000 mg | ORAL_TABLET | Freq: Three times a day (TID) | ORAL | Status: DC | PRN

## 2018-09-08 MED ORDER — VITAMIN C 500 MG PO TABS
1500.0000 mg | ORAL_TABLET | Freq: Every day | ORAL | Status: DC
  Administered 2018-09-09 – 2018-09-10 (×2): 1500 mg via ORAL
  Filled 2018-09-08 (×3): qty 3

## 2018-09-08 MED ORDER — CLONAZEPAM 1 MG PO TABS
1.0000 mg | ORAL_TABLET | Freq: Two times a day (BID) | ORAL | Status: DC
  Administered 2018-09-08 (×2): 1 mg via ORAL
  Filled 2018-09-08 (×2): qty 1

## 2018-09-08 MED ORDER — CLONAZEPAM 1 MG PO TABS
1.0000 mg | ORAL_TABLET | Freq: Three times a day (TID) | ORAL | Status: DC | PRN
  Administered 2018-09-08 (×2): 1 mg via ORAL
  Filled 2018-09-08 (×2): qty 1

## 2018-09-08 MED ORDER — MAGNESIUM HYDROXIDE 400 MG/5ML PO SUSP: 30.0000 mL | Freq: Every day | ORAL | Status: DC | PRN

## 2018-09-08 MED ORDER — LORATADINE 10 MG PO TABS
10.0000 mg | ORAL_TABLET | Freq: Every day | ORAL | Status: DC
  Administered 2018-09-08 – 2018-09-10 (×3): 10 mg via ORAL
  Filled 2018-09-08 (×3): qty 1

## 2018-09-08 MED ORDER — TEMAZEPAM 15 MG PO CAPS: 45.0000 mg | ORAL_CAPSULE | Freq: Every day | ORAL | Status: DC

## 2018-09-08 MED ORDER — CALCIUM CITRATE 950 (200 CA) MG PO TABS
600.0000 mg | ORAL_TABLET | Freq: Two times a day (BID) | ORAL | Status: DC
  Administered 2018-09-08 – 2018-09-10 (×4): 600 mg via ORAL
  Filled 2018-09-08 (×6): qty 3
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
## 2018-09-08 NOTE — Plan of Care (Signed)
Working on coping , decision anxiety and  skills . Limited participation with unit programing  .Interacting  with peer and staff  Able to verbalize feelings  Voice of no safety concerns  No anger management  or control issues . Patient  aware of information received on [place] Educ[employer] and unit programs . Attending unit programing  Problem: Education: Goal: Knowledge of [place] [facility] Education information/materials will improve Outcome: Progressing Goal: Emotional status will improve Outcome: Progressing Goal: Mental status will improve Outcome: Progressing Goal: Verbalization of understanding the information provided will improve Outcome: Progressing   Problem: Activity: Goal: Interest or engagement in activities will improve Outcome: Not Progressing Goal: Sleeping patterns will improve Outcome: Progressing   Problem: Coping: Goal: Ability to verbalize frustrations and anger appropriately will improve Outcome: Progressing Goal: Ability to demonstrate self-control will improve Outcome: Progressing

## 2018-09-08 NOTE — B[facility] Suicide Risk Assessment (Signed)
Catherine Olsen INPATIENT:  Family/Significant Other Suicide Prevention Education  Suicide Prevention Education:  Education Completed; Catherine Olsen, step mother [phone] has been identified by the patient as the family member/significant other with whom the patient will be residing, and identified as the person(s) who will aid the patient in the event of a mental health crisis (suicidal ideations/suicide attempt).  With written consent from the patient, the family member/significant other has been provided the following suicide prevention education, prior to the and/or following the discharge of the patient.  The suicide prevention education provided includes the following:  Suicide risk factors  Suicide prevention and interventions  National Suicide Hotline telephone number  [facility] assessment telephone number  [facility] Emergency Assistance [place] and/or Residential Mobile Crisis Unit telephone number  Request made of family/significant other to:  Remove weapons (e.g., guns, rifles, knives), all items previously/currently identified as safety concern.    Remove drugs/medications (over-the-counter, prescriptions, illicit drugs), all items previously/currently identified as a safety concern.  The family member/significant other verbalizes understanding of the suicide prevention education information provided.  The family member/significant other agrees to remove the items of safety concern listed above.  Catherine Olsen reported that she was unaware of pt being in the hospital and asked if pt was okay. Catherine Olsen denied any SI/HI concerns and denied concerns with pt returning to her apt at discharge. Catherine Olsen reported that she would like to hear from pt and asked CSW to let pt know to call her. Catherine Olsen reported there are no guns/weapons in pts home that she is aware of.   [place] MSW LCSW 09/08/2018, 11:32 AM

## 2018-09-08 NOTE — Progress Notes (Signed)
Recreation Therapy Notes  INPATIENT RECREATION THERAPY ASSESSMENT  Patient Details Name: Catherine Olsen MRN: [id number] DOB: 1970/07/09 Today's Date: 09/08/2018       Information Obtained From: Patient  Able to Participate in Assessment/Interview: Yes  Patient Presentation: Responsive  Reason for Admission (Per Patient): Suicidal Ideation, Active Symptoms  Patient Stressors:    Coping Skills:   Write, Exercise  Leisure Interests (2+):  Exercise - Walking, Exercise - Running(Yoga)  Frequency of Recreation/Participation: Monthly  Awareness of Community Resources:     [employer]:     Current Use:    If no, Barriers?:    Expressed Interest in [facility] of Residence:  [place]  Patient Main Form of Transportation: Musician  Patient Strengths:  N/A  Patient Identified Areas of Improvement:  Not be manic  Patient Goal for Hospitalization:  To get stable  Current SI (including self-harm):  No  Current HI:  No  Current AVH: No  Staff Intervention Plan: Group Attendance, Collaborate with Interdisciplinary Treatment Team  Consent to Intern Participation: N/A  Kinda Pottle 09/08/2018, 11:26 AM
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
## 2018-09-08 NOTE — Tx Team (Signed)
Initial Treatment Plan 09/08/2018 5:58 AM [name] [id number]:[id number]    PATIENT STRESSORS: Medication change or noncompliance Substance abuse Traumatic event   PATIENT STRENGTHS: Average or above average intelligence Capable of independent living Communication skills General fund of knowledge Motivation for treatment/growth   PATIENT IDENTIFIED PROBLEMS: Depression    PTSD    Bipolar                DISCHARGE CRITERIA:  Improved stabilization in mood, thinking, and/or behavior Medical problems require only outpatient monitoring Motivation to continue treatment in a less acute level of care  PRELIMINARY DISCHARGE PLAN: Outpatient therapy  PATIENT/FAMILY INVOLVEMENT: This treatment plan has been presented to and reviewed with the patient, MARCAYLA BUDGE, .  The patient and family have been given the opportunity to ask questions and make suggestions.  Harl Bowie, RN 09/08/2018, 5:58 AM

## 2018-09-08 NOTE — Progress Notes (Signed)
Im a manic depressive , and I don't want to live like this . Tjhat if I cant  Get stablize  I will go home and kill myself  Patiient  Very needt this am  D: Patient stated slept good last night .Stated appetite good and energy level   Exhausted  My orthopneic pain is bad  normal. Stated concentration is good . Stated on Depression scale 8 , hopeless 8 and anxiety  9.( low 0-10 high) States suicidal  No plan    . Denies  auditory hallucinations  No pain concerns . Appropriate ADL'S. Limited  Interacting with peers and staff.Patient aware of resources available . Working on [employer] . Limited participation with unit programing .Interacting  with peer and staff Able to verbalize feelingsVoice of no safety concerns No anger management or control issues .Patient aware of information received on [place] [facility] and unit programs . Patient requested a bible  To talk with a chaplin . Chaplin did come down to speak with patient .   A: Encourage patient participation with unit programming . Instruction  Given on  Medication , verbalize understanding.  R: Voice no other concerns. Staff continue to monitor

## 2018-09-08 NOTE — Progress Notes (Signed)
Brief Nutrition Note   Received consult notifying RD that patient is a vegan. Pt ordered for vegetarian diet. Pt will be provided with a vegetarian menu and will be able to order foods that she enjoys. We do not have a vegan menu or diet order at [facility].  Koleen Distance MS, RD, LDN Pager #- [phone] Office#- [phone] After Hours Pager: [phone]

## 2018-09-08 NOTE — Progress Notes (Signed)
Recreation Therapy Notes    Date: 09/08/2018  Time: 9:30 am  Location: Craft room  Behavioral response: Appropriate    Intervention Topic: Wellness  Discussion/Intervention:  Group content on today was focused on anger management. The group defined anger and reasons they become angry. Individuals expressed negative way they have dealt with anger in the past. Patients stated some positive ways they could deal with anger in the future. The group described how anger can affect your health and daily plans. Individuals participated in the intervention "Score your anger" where they had a chance to answer questions about themselves and get a score of their anger.   Clinical Observations/Feedback:  Patient came to group and explained that wellness is important because it is the quality of life. She identified getting medication that works for her is how she can improve her wellness in life. Participant stated that she would like to go back to group therapy she thinks that could help her improve her social wellness. Individual was social with peers and staff while participating in group.   Jun Rightmyer LRT/CTRS         Geanna Divirgilio 09/08/2018 11:21 AM

## 2018-09-08 NOTE — [facility] Group Notes (Signed)
LCSW Group Therapy Note  09/08/2018 12:26 PM  Type of Therapy/Topic:  Group Therapy:  Emotion Regulation  Participation Level:  Active   Description of Group:   The purpose of this group is to assist patients in learning to regulate negative emotions and experience positive emotions. Patients will be guided to discuss ways in which they have been vulnerable to their negative emotions. These vulnerabilities will be juxtaposed with experiences of positive emotions or situations, and patients will be challenged to use positive emotions to combat negative ones. Special emphasis will be placed on coping with negative emotions in conflict situations, and patients will process healthy conflict resolution skills.  Therapeutic Goals: 1. Patient will identify two positive emotions or experiences to reflect on in order to balance out negative emotions 2. Patient will label two or more emotions that they find the most difficult to experience 3. Patient will demonstrate positive conflict resolution skills through discussion and/or role plays  Summary of Patient Progress: Pt was appropriate and respectful in group. Pt was able to identify emotion regulation as "finding a way to be stable". Pt reported that it is important to talk about your emotions so you don't implode/explode your emotions. Pt reported that she was feeling like she had no control over her brain prior to her admission and still feels that way currently. Pt identified coping strategies as exercising, meditation, and little projects.   Therapeutic Modalities:   Cognitive Behavioral Therapy Feelings Identification Dialectical Behavioral Therapy   Evalina Field, MSW, LCSW Clinical Social Work 09/08/2018 12:26 PM

## 2018-09-08 NOTE — H&P (Signed)
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   Eyes: Negative.   Respiratory: Negative.   Cardiovascular: Negative.   Gastrointestinal: Negative.   Musculoskeletal: Negative.   Skin: Negative.   Neurological: Negative.   Psychiatric/Behavioral: Positive for depression, substance abuse and suicidal ideas. Negative for hallucinations. The patient is nervous/anxious and has insomnia.     Blood pressure 95/80, pulse 86, temperature (!) 97.5 F (36.4 C), temperature source Oral, resp. rate 17, height 5\' 8"  (1.727 m), weight 61.7 kg, last menstrual period 11/21/2014, SpO2 98 %.Body mass index is 20.68 kg/m.  General Appearance: Disheveled  Eye Contact:  Good  Speech:  Pressured  Volume:  Increased  Mood:  Anxious, Depressed  and Irritable  Affect:  Labile  Thought Process:  Disorganized  Orientation:  Full (Time, Place, and Person)  Thought Content:  Illogical and Paranoid Ideation  Suicidal Thoughts:  Yes.  with intent/plan  Homicidal Thoughts:  No  Memory:  Immediate;   Fair Recent;   Fair Remote;  Fair  Judgement:  Impaired  Insight:  Shallow  Psychomotor Activity:  Increased  Concentration:  Concentration: Fair  Recall:  [employer] of Knowledge:  Fair  Language:  Fair  Akathisia:  No  Handed:  Right  AIMS (if indicated):     Assets:  Communication Skills Desire for Improvement Financial Resources/Insurance Housing  ADL's:  Intact  Cognition:  WNL  Sleep:  Number of Hours: 3.5    Treatment Plan Summary: Daily contact with patient to assess and evaluate symptoms and progress in treatment, Medication management and Plan 48 year old woman with bipolar disorder who presents with mania.  She is telling me that if I cannot fix her to where she feels "stable" she will go home and hang herself.  She says the last time she was stable when it was when she was about 48 years old.  Patient will be very challenging given that she is rejecting all of the normally recommended medicines for her condition and instead insisting that the only thing that we will treat her is an extremely low dose of medicine.  This at the same time that she is insisting that she needs high doses of multiple benzodiazepines.  I checked the controlled substance database and it appears true that she really is prescribed these high doses of clonazepam alprazolam and temazepam.  We will try going up to 5 mg on the Abilify.  I hope to get in touch with her outpatient psychiatric provider.  We will engage patient in groups and activities on the unit and have ongoing reassessment.  Patient is on multiple symptomatic medications as well as vitamins that she feels are crucial to her wellbeing and we will try to make sure that those are provided as  needed.  Observation Level/Precautions:  15 minute checks  Laboratory:  UDS  Psychotherapy:    Medications:    Consultations:    Discharge Concerns:    Estimated LOS:  Other:     Physician Treatment Plan for Primary Diagnosis: Bipolar affective disorder, manic, severe ([place]) Long Term Goal(s): Improvement in symptoms so as ready for discharge  Short Term Goals: Ability to verbalize feelings will improve, Ability to disclose and discuss suicidal ideas and Ability to demonstrate self-control will improve  Physician Treatment Plan for Secondary Diagnosis: Principal Problem:   Bipolar affective disorder, manic, severe ([place]) Active Problems:   Posttraumatic stress disorder   Cocaine abuse ([employer] [employer])   Amphetamine abuse ([place])   Chronic prescription benzodiazepine use   Suicidal ideation  Long Term Goal(s): Improvement in symptoms so as ready for discharge  Short Term Goals: Ability to demonstrate self-control will improve and Ability to identify triggers associated with substance abuse/mental health issues will improve  I certify that inpatient services furnished can reasonably be expected to improve the patient's condition.    Alethia Berthold, MD 6/3/20204:27 PM

## 2018-09-08 NOTE — Tx Team (Addendum)
Interdisciplinary Treatment and Diagnostic Plan Update  09/08/2018 Time of Session: [place] MRN: [id number]  Principal Diagnosis: <principal problem not specified>  Secondary Diagnoses: Active Problems:   Bipolar 1 disorder, manic, moderate (H[facility])   Current Medications:  Current Facility-Administered Medications  Medication Dose Route Frequency Provider Last Rate Last Dose  . acetaminophen (TYLENOL) tablet 650 mg  650 mg Oral Q6H PRN Lamont Dowdy, NP   650 mg at 09/08/18 0655  . albuterol (VENTOLIN HFA) 108 (90 Base) MCG/ACT inhaler 2 puff  2 puff Inhalation Q4H PRN Lamont Dowdy, NP      . alum & mag hydroxide-simeth (MAALOX/MYLANTA) 200-200-20 MG/5ML suspension 15 mL  15 mL Oral Q6H PRN Lamont Dowdy, NP      . alum & mag hydroxide-simeth (MAALOX/MYLANTA) 200-200-20 MG/5ML suspension 30 mL  30 mL Oral Q4H PRN Lamont Dowdy, NP      . antiseptic oral rinse (BIOTENE) solution 15 mL  15 mL Mouth Rinse PRN Lamont Dowdy, NP      . Derrill Memo ON 09/09/2018] ARIPiprazole (ABILIFY) tablet 5 mg  5 mg Oral Daily Clapacs, John T, MD      . clonazePAM (KLONOPIN) tablet 1 mg  1 mg Oral Q8H PRN Lamont Dowdy, NP   1 mg at 09/08/18 0140  . clonazePAM (KLONOPIN) tablet 1 mg  1 mg Oral BID Lamont Dowdy, NP   1 mg at 09/08/18 0826  . diphenhydrAMINE (BENADRYL) capsule 50 mg  50 mg Oral Q8H PRN Lamont Dowdy, NP      . doxycycline (VIBRA-TABS) tablet 100 mg  100 mg Oral Daily Thomspon, Geni Bers, NP   100 mg at 09/08/18 0829  . EPINEPHrine (EPI-PEN) injection 0.3 mg  0.3 mg Subcutaneous Once Lamont Dowdy, NP      . estradiol (CLIMARA - Dosed in mg/24 hr) patch 0.025 mg  0.025 mg Transdermal Weekly Thomspon, Geni Bers, NP      . famotidine (PEPCID) tablet 20 mg  20 mg Oral Daily Thomspon, Geni Bers, NP   20 mg at 09/08/18 0830  . fluticasone (FLONASE) 50 MCG/ACT nasal spray 1 spray  1 spray Each Nare Daily Lamont Dowdy, NP   1  spray at 09/08/18 1347  . HYDROcodone-acetaminophen (NORCO/VICODIN) 5-325 MG per tablet 1 tablet  1 tablet Oral Q6H PRN Lamont Dowdy, NP      . ibuprofen (ADVIL) tablet 800 mg  800 mg Oral Q8H PRN Lamont Dowdy, NP   800 mg at 09/08/18 0943  . loratadine (CLARITIN) tablet 10 mg  10 mg Oral Daily Lamont Dowdy, NP   10 mg at 09/08/18 0826  . magnesium gluconate (MAGONATE) tablet 500 mg  500 mg Oral Daily Thomspon, Geni Bers, NP   500 mg at 09/08/18 0830  . magnesium hydroxide (MILK OF MAGNESIA) suspension 30 mL  30 mL Oral Daily PRN Lamont Dowdy, NP      . multivitamin with minerals tablet 1 tablet  1 tablet Oral Daily Thomspon, Geni Bers, NP   1 tablet at 09/08/18 0827  . nicotine (NICODERM CQ - dosed in mg/24 hours) patch 14 mg  14 mg Transdermal Daily Lamont Dowdy, NP   14 mg at 09/08/18 0655  . oxymetazoline (AFRIN) 0.05 % nasal spray 1 spray  1 spray Each Nare BID Thomspon, Geni Bers, NP      . temazepam (RESTORIL) capsule 45 mg  45 mg Oral QHS Thomspon, Geni Bers, NP   45 mg at 09/08/18 0140  . vitamin C (ASCORBIC ACID) tablet 1,500 mg  1,500 mg Oral  Daily Lamont Dowdy, NP   1,500 mg at 09/08/18 0831   PTA Medications: Medications Prior to Admission  Medication Sig Dispense Refill Last Dose  . ABILIFY 5 MG tablet Take 2.5-5 mg by mouth daily at 12 noon.   09/07/2018 at 1200  . acetaminophen (TYLENOL) 325 MG tablet Take by mouth every 6 (six) hours as needed.   prn at prn  . albuterol (VENTOLIN HFA) 108 (90 Base) MCG/ACT inhaler Inhale 2 puffs into the lungs every 4 (four) hours as needed for wheezing or shortness of breath. 1 Inhaler 1 prn at prn  . ALPRAZolam (XANAX) 1 MG tablet Take 1 mg by mouth 3 (three) times daily.   Past Week at Unknown time  . Calcium Citrate 200 MG TABS Take 600 mg by mouth 2 (two) times daily.   09/07/2018 at 0600  . celecoxib (CELEBREX) 200 MG capsule Take 200 mg by mouth daily.   Not Taking at Unknown time  .  clonazePAM (KLONOPIN) 1 MG tablet Take 1 mg by mouth 2 (two) times daily.   Past Week at Unknown time  . doxycycline (DORYX) 100 MG EC tablet Take 100 mg by mouth 2 (two) times daily.   09/07/2018 at 0600  . EPINEPHrine 0.3 mg/0.3 mL IJ SOAJ injection Inject 0.3 mLs into the skin once.  2 prn at prn  . estradiol (CLIMARA - DOSED IN MG/24 HR) 0.025 mg/24hr patch Place 0.025 mg onto the skin once a week.   12 Past Week at Unknown time  . famotidine (PEPCID) 20 MG tablet Take 1 tablet twice a day for reflux, may help hives (Patient not taking: Reported on 09/07/2018) 60 tablet 5 Not Taking at Unknown time  . fluticasone (FLONASE) 50 MCG/ACT nasal spray 1 spray per nostril twice a day if needed for stuffy nose 18.2 g 5 09/07/2018 at 0600  . HYDROcodone-acetaminophen (NORCO) 5-325 MG tablet Take 1 tablet by mouth every 6 (six) hours as needed for moderate pain. 15 tablet 0 Past Week at Unknown time  . ibuprofen (ADVIL,MOTRIN) 800 MG tablet TAKE 1 TABLET(800 MG) BY MOUTH EVERY 8 HOURS AS NEEDED (Patient taking differently: every 6 (six) hours as needed for mild pain or moderate pain. ) 30 tablet 0 prn at prn  . loratadine (CLARITIN) 10 MG tablet Take 10 mg by mouth daily.    09/07/2018 at 0600  . magnesium gluconate (MAGONATE) 500 MG tablet Take 500 mg by mouth daily.   09/07/2018 at 0600  . Multiple Vitamin (MULTIVITAMIN) capsule Take 1 capsule by mouth daily.   09/07/2018 at 0600  . nicotine (NICODERM CQ - DOSED IN MG/24 HOURS) 14 mg/24hr patch Place 1 patch (14 mg total) onto the skin daily. 28 patch 0 09/06/2018 at Unknown time  . progesterone (PROMETRIUM) 200 MG capsule Take 200 mg by mouth daily.   3 Past Week at Unknown time  . temazepam (RESTORIL) 15 MG capsule Take 45 mg by mouth at bedtime.  1 09/06/2018 at 2100  . vitamin C (ASCORBIC ACID) 500 MG tablet Take 1,500 mg by mouth daily.   09/07/2018 at 0600  . Vitamin D, Cholecalciferol, 50 MCG (2000 UT) CAPS Take 2,000 Units by mouth 2 (two) times daily.   09/07/2018  at 0600    Patient Stressors: Medication change or noncompliance Substance abuse Traumatic event  Patient Strengths: Average or above average intelligence Capable of independent living Communication skills General fund of knowledge Motivation for treatment/growth  Treatment Modalities: Medication Management, Group therapy, Case  management,  1 to 1 session with clinician, Psychoeducation, Recreational therapy.   Physician Treatment Plan for Primary Diagnosis: <principal problem not specified> Long Term Goal(s):     Short Term Goals:    Medication Management: Evaluate patient's response, side effects, and tolerance of medication regimen.  Therapeutic Interventions: 1 to 1 sessions, Unit Group sessions and Medication administration.  Evaluation of Outcomes: Progressing  Physician Treatment Plan for Secondary Diagnosis: Active Problems:   Bipolar 1 disorder, manic, moderate ([place])  Long Term Goal(s):     Short Term Goals:       Medication Management: Evaluate patient's response, side effects, and tolerance of medication regimen.  Therapeutic Interventions: 1 to 1 sessions, Unit Group sessions and Medication administration.  Evaluation of Outcomes: Progressing   RN Treatment Plan for Primary Diagnosis: <principal problem not specified> Long Term Goal(s): Knowledge of disease and therapeutic regimen to maintain health will improve  Short Term Goals: Ability to demonstrate self-control, Ability to verbalize feelings will improve, Ability to disclose and discuss suicidal ideas and Ability to identify and develop effective coping behaviors will improve  Medication Management: RN will administer medications as ordered by provider, will assess and evaluate patient's response and provide education to patient for prescribed medication. RN will report any adverse and/or side effects to prescribing provider.  Therapeutic Interventions: 1 on 1 counseling sessions, Psychoeducation,  Medication administration, Evaluate responses to treatment, Monitor vital signs and CBGs as ordered, Perform/monitor CIWA, COWS, AIMS and Fall Risk screenings as ordered, Perform wound care treatments as ordered.  Evaluation of Outcomes: Progressing   LCSW Treatment Plan for Primary Diagnosis: <principal problem not specified> Long Term Goal(s): Safe transition to appropriate next level of care at discharge, Engage patient in therapeutic group addressing interpersonal concerns.  Short Term Goals: Engage patient in aftercare planning with referrals and resources, Increase social support, Facilitate acceptance of mental health diagnosis and concerns and Increase skills for wellness and recovery  Therapeutic Interventions: Assess for all discharge needs, 1 to 1 time with Social worker, Explore available resources and support systems, Assess for adequacy in community support network, Educate family and significant other(s) on suicide prevention, Complete Psychosocial Assessment, Interpersonal group therapy.  Evaluation of Outcomes: Progressing   Progress in Treatment: Attending groups: Yes. Participating in groups: Yes. Taking medication as prescribed: Yes. Toleration medication: Yes. Family/Significant other contact made: No, will contact:  pts step-mother Patient understands diagnosis: Yes. Discussing patient identified problems/goals with staff: Yes. Medical problems stabilized or resolved: Yes. Denies suicidal/homicidal ideation: Yes. Passive SI Issues/concerns per patient self-inventory: No. Other: N/A  New problem(s) identified: No, Describe:  none  New Short Term/Long Term Goal(s): medication management for mood stabilization; elimination of SI thoughts; development of comprehensive mental wellness/sobriety plan.   Patient Goals:  "I want to become stable and be on something to stay stable"  Discharge Plan or Barriers: SPE pamphlet, Mobile Crisis information, and AA/NA  information provided to patient for additional community support and resources. Pt is agreeable to continue services at [facility].  Reason for Continuation of Hospitalization: Anxiety Depression Medication stabilization  Estimated Length of Stay: 5-7 days Recreational Therapy: Patient Stressors: N/A  Patient Goal: Patient will identify 3 positive coping skills strategies to use for SI post d/c within 5 recreation therapy group sessions  Attendees: Patient: Catherine Olsen 09/08/2018 3:26 PM  Physician: Dr Weber Cooks MD 09/08/2018 3:26 PM  Nursing: Polly Cobia RN 09/08/2018 3:26 PM  RN Care Manager: 09/08/2018 3:26 PM  Social Worker: Minette Brine Moton LCSW 09/08/2018  3:26 PM  Recreational Therapist: Roanna Epley CTRS LRT 09/08/2018 3:26 PM  Other: Assunta Curtis LCSW 09/08/2018 3:26 PM  Other: Sanjuana Kava LCSW 09/08/2018 3:26 PM  Other: 09/08/2018 3:26 PM    Scribe for Treatment Team: Mariann Laster Moton, LCSW 09/08/2018 3:26 PM

## 2018-09-08 NOTE — BHH Suicide Risk Assessment (Signed)
[facility] INPATIENT:  Family/Significant Other Suicide Prevention Education  Suicide Prevention Education:  Contact Attempts: Breann Losano, step mother [phone] has been identified by the patient as the family member/significant other with whom the patient will be residing, and identified as the person(s) who will aid the patient in the event of a mental health crisis.  With written consent from the patient, two attempts were made to provide suicide prevention education, prior to and/or following the patient's discharge.  We were unsuccessful in providing suicide prevention education.  A suicide education pamphlet was given to the patient to share with family/significant other.  Date and time of first attempt: 09/08/2018 at 9:48AM Date and time of second attempt:  CSW left a HIPAA compliant voicemail requesting a call back.   [place] MSW LCSW 09/08/2018, 9:48 AM
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
## 2018-09-08 NOTE — Plan of Care (Signed)
  Problem: Safety: Goal: Periods of time without injury will increase Outcome: Progressing  Patient continues to the unit no injuries noted, appears safe.

## 2018-09-09 MED ORDER — ARIPIPRAZOLE 5 MG PO TABS
5.0000 mg | ORAL_TABLET | Freq: Every day | ORAL | Status: DC
  Administered 2018-09-09: 5 mg via ORAL
  Filled 2018-09-09: qty 1

## 2018-09-09 MED ORDER — IBUPROFEN 200 MG PO TABS
800.0000 mg | ORAL_TABLET | Freq: Three times a day (TID) | ORAL | Status: DC | PRN
  Administered 2018-09-09 – 2018-09-10 (×3): 800 mg via ORAL
  Filled 2018-09-09 (×3): qty 4

## 2018-09-09 MED ORDER — ESTRADIOL 0.025 MG/24HR TD PTWK
0.0250 mg | MEDICATED_PATCH | TRANSDERMAL | Status: DC
  Administered 2018-09-09: 0.025 mg via TRANSDERMAL

## 2018-09-09 MED ORDER — CLONAZEPAM 1 MG PO TABS
0.5000 mg | ORAL_TABLET | Freq: Two times a day (BID) | ORAL | Status: DC | PRN
  Administered 2018-09-09 – 2018-09-10 (×2): 1 mg via ORAL
  Filled 2018-09-09 (×2): qty 1

## 2018-09-09 MED ORDER — CHLORHEXIDINE GLUCONATE 0.12% ORAL RINSE (MEDLINE KIT): 15.0000 mL | Freq: Two times a day (BID) | OROMUCOSAL | Status: DC

## 2018-09-09 MED ORDER — CLONAZEPAM 1 MG PO TABS
1.0000 mg | ORAL_TABLET | Freq: Every evening | ORAL | Status: DC | PRN
  Administered 2018-09-09: 22:00:00 1 mg via ORAL
  Filled 2018-09-09: qty 1

## 2018-09-09 MED ORDER — ARIPIPRAZOLE 5 MG PO TABS: 5.0000 mg | ORAL_TABLET | Freq: Every day | ORAL | Status: DC

## 2018-09-09 MED ORDER — MAGNESIUM GLUCONATE 500 MG PO TABS
500.0000 mg | ORAL_TABLET | Freq: Every day | ORAL | Status: DC
  Filled 2018-09-09: qty 1

## 2018-09-09 MED ORDER — ESTRADIOL 0.025 MG/24HR TD PTWK
0.0250 mg | MEDICATED_PATCH | TRANSDERMAL | Status: DC
  Filled 2018-09-09: qty 1

## 2018-09-09 MED ORDER — ESTRADIOL 0.025 MG/24HR TD PTWK
0.0250 mg | MEDICATED_PATCH | TRANSDERMAL | Status: DC
  Administered 2018-09-09: 17:00:00 0.025 mg via TRANSDERMAL

## 2018-09-09 NOTE — BHH Group Notes (Signed)
Balance In Life 09/09/2018 1PM  Type of Therapy/Topic:  Group Therapy:  Balance in Life  Participation Level:  Did Not Attend  Description of Group:   This group will address the concept of balance and how it feels and looks when one is unbalanced. Patients will be encouraged to process areas in their lives that are out of balance and identify reasons for remaining unbalanced. Facilitators will guide patients in utilizing problem-solving interventions to address and correct the stressor making their life unbalanced. Understanding and applying boundaries will be explored and addressed for obtaining and maintaining a balanced life. Patients will be encouraged to explore ways to assertively make their unbalanced needs known to significant others in their lives, using other group members and facilitator for support and feedback.  Therapeutic Goals: 1. Patient will identify two or more emotions or situations they have that consume much of in their lives. 2. Patient will identify signs/triggers that life has become out of balance:  3. Patient will identify two ways to set boundaries in order to achieve balance in their lives:  4. Patient will demonstrate ability to communicate their needs through discussion and/or role plays  Summary of Patient Progress:    Therapeutic Modalities:   Cognitive Behavioral Therapy Solution-Focused Therapy Assertiveness Training  Catherine Olsen Catherine Smoke, LCSW

## 2018-09-09 NOTE — Progress Notes (Signed)
Recreation Therapy Notes   Date: 09/09/2018  Time: 9:30 am  Location: Craft Room  Behavioral response: Appropriate  Intervention Topic: Goals  Discussion/Intervention:  Group content on today was focused on goals. Patients described what goals are and how they define goals. Individuals expressed how they go about setting goals and reaching them. The group identified how important goals are and if they make short term goals to reach long term goals. Patients described how many goals they work on at a time and what affects them not reaching their goal. Individuals described how much time they put into planning and obtaining their goals. The group participated in the intervention "My Goal Board" and made personal goal boards to help them achieve their goal. Clinical Observations/Feedback:  Patient came to group and explained that she makes short term, medium term and long-term goals. She stated she makes 10 goals a year and tries to reach them by the end of the year. Participant expressed that goals give her meaning and purpose in life. Individual was social with peers and staff while participating in the intervention. Katerra Ingman LRT/CTRS           Willetta York 09/09/2018 1:01 PM

## 2018-09-09 NOTE — Plan of Care (Signed)
  Problem: Education: Goal: Mental status will improve Outcome: Progressing  Patient alert and oriented x 4, still appears anxious and restless, emotional support offered.

## 2018-09-09 NOTE — Progress Notes (Signed)
[facility] MD Progress Note  09/09/2018 11:51 AM Catherine Olsen  MRN:  [id number] Subjective: Follow-up for this patient with bipolar disorder.  Before I got here this morning apparently she had a disagreement that escalated a little bit with nursing staff during medicine time.  Patient had several complaints about her medicines when I met with her.  She wished to change the times of her Abilify and magnesium to evening rather than morning.  She states that she takes her estrogen patch every 3 days rather than every 7 days.  She wanted to have more flexibility in how she took her clonazepam taking it as one half or 1 mg twice a day as needed for anxiety at her discretion +1 mg as needed at night for sleep.  I agreed to all of these changes and have done my best to make them clear in the chart.  I also added Peridex mouthwash at her request because of her report that she has a sore tooth and had been instructed to use antiseptic mouthwash.  Patient appeared satisfied by all of these things.  Nevertheless her mood is still labile going from crying to irritable very quickly.  Speech is very rapid.  Thoughts however seem relatively organized.  Continues to say she thinks she would kill her self if she does not feel "stable". Principal Problem: Bipolar affective disorder, manic, severe (HCC) Diagnosis: Principal Problem:   Bipolar affective disorder, manic, severe (HCC) Active Problems:   Posttraumatic stress disorder   Cocaine abuse ([place])   Amphetamine abuse ([place])   Chronic prescription benzodiazepine use   Suicidal ideation  Total Time spent with patient: 30 minutes  Past Psychiatric History: Long history of bipolar disorder several prior hospitalizations.  See previous notes  Past Medical History:  Past Medical History:  Diagnosis Date  . Abnormal uterine bleeding (AUB) 06/25/2009   Qualifier: Diagnosis of  By: Cathren Laine MD, Ankit    . Allergy   . Anxiety   . Arthritis    hands, lower back, knees  . Asthma    . Bipolar 1 disorder ([place])   . COPD (chronic obstructive pulmonary disease) ([place])   . Depression   . Endometriosis   . Fibromyalgia   . Headache(784.0)    otc med prn  . Migraine   . Pituitary tumor    microadenoma  . PONV (postoperative nausea and vomiting)   . PTSD (post-traumatic stress disorder)   . Scoliosis   . Termination of pregnancy    x 2 at age 40 and 48 yrs old  . Tobacco abuse 03/04/2015    Past Surgical History:  Procedure Laterality Date  . BREAST BIOPSY Right 05/19/2007  . BREAST BIOPSY Right 06/02/2007  . BREAST SURGERY    . DILATION AND CURETTAGE OF UTERUS    . FACIAL COSMETIC SURGERY     right cheek  . LAPAROSCOPY Right 11/11/2013   Procedure: LAPAROSCOPY OPERATIVE with  Drainage of  RIGHT Ovarian ENDOMETRIOMA;  Surgeon: Elveria Royals, MD;  Location: [facility];  Service: Gynecology;  Laterality: Right;  . NOSE SURGERY     rhinoplasty at age 54 yrs  . WISDOM TOOTH EXTRACTION     Family History:  Family History  Problem Relation Age of Onset  . Diabetes Mother   . Hypertension Mother   . Stroke Mother 39       CVA  . Mental illness Mother        no diagnosis; personality disorder  . Hyperlipidemia Father   .  Hypertension Father   . COPD Father   . Alpha-1 antitrypsin deficiency Father   . Asthma Father   . Alpha-1 antitrypsin deficiency Brother   . Asthma Brother   . Diabetes Maternal Grandmother   . Heart disease Maternal Grandmother   . Hyperlipidemia Maternal Grandmother   . Hypertension Maternal Grandmother   . Mental illness Maternal Grandmother   . Heart disease Maternal Grandfather   . Hyperlipidemia Maternal Grandfather   . Hypertension Maternal Grandfather   . Asthma Maternal Grandfather   . Heart disease Paternal Grandfather   . Hyperlipidemia Paternal Grandfather   . Hypertension Paternal Grandfather   . Stroke Paternal Grandfather   . Alzheimer's disease Paternal Grandmother   . Allergic rhinitis Neg Hx   . Angioedema Neg Hx    . Eczema Neg Hx   . Urticaria Neg Hx   . Immunodeficiency Neg Hx    Family Psychiatric  History: See previous notes from what she says it sounds like there have been multiple members of her family with bipolar disorder Social History:  Social History   Substance and Sexual Activity  Alcohol Use Not Currently     Social History   Substance and Sexual Activity  Drug Use Yes  . Types: "Crack" cocaine   Comment: Heavy user per patient, last use Tuesday 11/08/13  LAST CRACK  2015    Social History   Socioeconomic History  . Marital status: Divorced    Spouse name: Not on file  . Number of children: 0  . Years of education: Not on file  . Highest education level: Bachelor's degree (e.g., BA, AB, BS)  Occupational History  . Occupation: disability    Comment: mental illness  Social Needs  . Financial resource strain: Not on file  . Food insecurity:    Worry: Not on file    Inability: Not on file  . Transportation needs:    Medical: Not on file    Non-medical: Not on file  Tobacco Use  . Smoking status: Current Some Day Smoker    Packs/day: 2.00    Years: 29.00    Pack years: 58.00    Types: Cigarettes  . Smokeless tobacco: Never Used  Substance and Sexual Activity  . Alcohol use: Not Currently  . Drug use: Yes    Types: "Crack" cocaine    Comment: Heavy user per patient, last use Tuesday 11/08/13  LAST CRACK  2015  . Sexual activity: Not Currently    Comment: abortion at 4 and 85yr. of age   Lifestyle  . Physical activity:    Days per week: Not on file    Minutes per session: Not on file  . Stress: Not on file  Relationships  . Social connections:    Talks on phone: Not on file    Gets together: Not on file    Attends religious service: Not on file    Active member of club or organization: Not on file    Attends meetings of clubs or organizations: Not on file    Relationship status: Not on file  Other Topics Concern  . Not on file  Social History Narrative    Marital status: divorced; not dating      Children: none      Lives: alone with mom      Employment: disability for mental illness in 1997.  Hospitalizations x 9 in past.      Tobacco: 1 ppd x since age 48 Decreasing in 2018.  Alcohol: socially; beers.      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      Exercise: yoga daily; exercise daily.      ADLs: independent with ADLs; no car; depends on others for transportation.      Patient is right-handed. She is divorced and lives with her mother. She drinks 2-3 cups of 1/2 caffeine coffe a day. She walks occasionally for exercise.   Additional Social History:                         Sleep: Fair  Appetite:  Fair  Current Medications: Current Facility-Administered Medications  Medication Dose Route Frequency Provider Last Rate Last Dose  . acetaminophen (TYLENOL) tablet 650 mg  650 mg Oral Q6H PRN Lamont Dowdy, NP   650 mg at 09/08/18 2209  . albuterol (VENTOLIN HFA) 108 (90 Base) MCG/ACT inhaler 2 puff  2 puff Inhalation Q4H PRN Lamont Dowdy, NP      . alum & mag hydroxide-simeth (MAALOX/MYLANTA) 200-200-20 MG/5ML suspension 15 mL  15 mL Oral Q6H PRN Lamont Dowdy, NP      . alum & mag hydroxide-simeth (MAALOX/MYLANTA) 200-200-20 MG/5ML suspension 30 mL  30 mL Oral Q4H PRN Lamont Dowdy, NP      . antiseptic oral rinse (BIOTENE) solution 15 mL  15 mL Mouth Rinse PRN Lamont Dowdy, NP      . Derrill Memo ON 09/10/2018] ARIPiprazole (ABILIFY) tablet 5 mg  5 mg Oral QHS Jasiri Hanawalt T, MD      . calcium citrate (CALCITRATE - dosed in mg elemental calcium) tablet 600 mg of elemental calcium  600 mg of elemental calcium Oral BID Joan Avetisyan T, MD   600 mg of elemental calcium at 09/09/18 0830  . chlorhexidine gluconate (MEDLINE KIT) (PERIDEX) 0.12 % solution 15 mL  15 mL Mouth/Throat BID Mesa Janus T, MD      . cholecalciferol (VITAMIN D3) tablet 2,000 Units  2,000 Units Oral  BID Janari Yamada, Madie Reno, MD   2,000 Units at 09/09/18 0827  . clonazePAM (KLONOPIN) tablet 0.5-1 mg  0.5-1 mg Oral BID PRN Johnsie Moscoso, Madie Reno, MD      . clonazePAM (KLONOPIN) tablet 1 mg  1 mg Oral QHS PRN Vallie Fayette T, MD      . diphenhydrAMINE (BENADRYL) capsule 50 mg  50 mg Oral Q8H PRN Lamont Dowdy, NP      . doxycycline (VIBRA-TABS) tablet 100 mg  100 mg Oral Daily Thomspon, Geni Bers, NP   100 mg at 09/09/18 0962  . EPINEPHrine (EPI-PEN) injection 0.3 mg  0.3 mg Subcutaneous Once Lamont Dowdy, NP      . estradiol (CLIMARA - Dosed in mg/24 hr) patch 0.025 mg  0.025 mg Transdermal Q3 days Keyondra Lagrand T, MD      . famotidine (PEPCID) tablet 20 mg  20 mg Oral Daily Thomspon, Geni Bers, NP   20 mg at 09/08/18 0830  . fluticasone (FLONASE) 50 MCG/ACT nasal spray 1 spray  1 spray Each Nare Daily Lamont Dowdy, NP   1 spray at 09/09/18 [phone]  . HYDROcodone-acetaminophen (NORCO/VICODIN) 5-325 MG per tablet 1 tablet  1 tablet Oral Q6H PRN Lamont Dowdy, NP   1 tablet at 09/09/18 0528  . ibuprofen (ADVIL) tablet 800 mg  800 mg Oral Q8H PRN Lamont Dowdy, NP   800 mg at 09/09/18 0415  . loratadine (CLARITIN) tablet 10 mg  10 mg Oral Daily Lamont Dowdy, NP  10 mg at 09/09/18 0827  . [START ON 09/10/2018] magnesium gluconate (MAGONATE) tablet 500 mg  500 mg Oral QHS Katlynne Mckercher T, MD      . magnesium hydroxide (MILK OF MAGNESIA) suspension 30 mL  30 mL Oral Daily PRN Lamont Dowdy, NP      . multivitamin with minerals tablet 1 tablet  1 tablet Oral Daily Thomspon, Geni Bers, NP   1 tablet at 09/08/18 0827  . nicotine (NICODERM CQ - dosed in mg/24 hours) patch 14 mg  14 mg Transdermal Daily Lamont Dowdy, NP   14 mg at 09/09/18 1735  . oxymetazoline (AFRIN) 0.05 % nasal spray 1 spray  1 spray Each Nare BID Lamont Dowdy, NP      . temazepam (RESTORIL) capsule 45 mg  45 mg Oral QHS Thomspon, Geni Bers, NP   45 mg at 09/08/18 2209  . vitamin  C (ASCORBIC ACID) tablet 1,500 mg  1,500 mg Oral Daily Wadie Mattie, Madie Reno, MD   1,500 mg at 09/09/18 6701    Lab Results:  Results for orders placed or performed during the hospital encounter of 09/07/18 (from the past 48 hour(s))  Comprehensive metabolic panel     Status: None   Collection Time: 09/07/18  7:18 PM  Result Value Ref Range   Sodium 139 135 - 145 mmol/L   Potassium 3.8 3.5 - 5.1 mmol/L   Chloride 105 98 - 111 mmol/L   CO2 25 22 - 32 mmol/L   Glucose, Bld 89 70 - 99 mg/dL   BUN 18 6 - 20 mg/dL   Creatinine, Ser 0.67 0.44 - 1.00 mg/dL   Calcium 9.0 8.9 - 10.3 mg/dL   Total Protein 6.9 6.5 - 8.1 g/dL   Albumin 4.3 3.5 - 5.0 g/dL   AST 18 15 - 41 U/L   ALT 13 0 - 44 U/L   Alkaline Phosphatase 75 38 - 126 U/L   Total Bilirubin 0.4 0.3 - 1.2 mg/dL   GFR calc non Af Amer >60 >60 mL/min   GFR calc Af Amer >60 >60 mL/min   Anion gap 9 5 - 15    Comment: Performed at [facility], [address] [place], [place] [address]  Ethanol     Status: None   Collection Time: 09/07/18  7:18 PM  Result Value Ref Range   Alcohol, Ethyl (B) <10 <10 mg/dL    Comment: (NOTE) Lowest detectable limit for serum alcohol is 10 mg/dL. For medical purposes only. Performed at [facility], [address] [place], [place] [address]   Salicylate level     Status: None   Collection Time: 09/07/18  7:18 PM  Result Value Ref Range   Salicylate Lvl <8.8 2.8 - 30.0 mg/dL    Comment: Performed at [facility], [address] [place], [place] [address]  Acetaminophen level     Status: Abnormal   Collection Time: 09/07/18  7:18 PM  Result Value Ref Range   Acetaminophen (Tylenol), Serum <10 (L) 10 - 30 ug/mL    Comment: (NOTE) Therapeutic concentrations vary significantly. A range of 10-30 ug/mL  may be an effective concentration for many patients. However, some  are best treated at concentrations outside of this range. Acetaminophen concentrations >150 ug/mL at 4  hours after ingestion  and >50 ug/mL at 12 hours after ingestion are often associated with  toxic reactions. Performed at [facility], [address] [place], [place] [address]   cbc  Status: Abnormal   Collection Time: 09/07/18  7:18 PM  Result Value Ref Range   WBC 7.5 4.0 - 10.5 K/uL   RBC 4.92 3.87 - 5.11 MIL/uL   Hemoglobin 16.0 (H) 12.0 - 15.0 g/dL   HCT 46.3 (H) 36.0 - 46.0 %   MCV 94.1 80.0 - 100.0 fL   MCH 32.5 26.0 - 34.0 pg   MCHC 34.6 30.0 - 36.0 g/dL   RDW 13.4 11.5 - 15.5 %   Platelets 276 150 - 400 K/uL   nRBC 0.0 0.0 - 0.2 %    Comment: Performed at [facility], [address] [place], [place] [address]  Urine Drug Screen, Qualitative     Status: Abnormal   Collection Time: 09/07/18  7:18 PM  Result Value Ref Range   Tricyclic, Ur Screen NONE DETECTED NONE DETECTED   Amphetamines, Ur Screen POSITIVE (A) NONE DETECTED   MDMA (Ecstasy)Ur Screen NONE DETECTED NONE DETECTED   Cocaine Metabolite,Ur [place] POSITIVE (A) NONE DETECTED   Opiate, Ur Screen NONE DETECTED NONE DETECTED   Phencyclidine (PCP) Ur S NONE DETECTED NONE DETECTED   Cannabinoid 50 Ng, Ur [place] NONE DETECTED NONE DETECTED   Barbiturates, Ur Screen NONE DETECTED NONE DETECTED   Benzodiazepine, Ur Scrn POSITIVE (A) NONE DETECTED   Methadone Scn, Ur NONE DETECTED NONE DETECTED    Comment: (NOTE) Tricyclics + metabolites, urine    Cutoff 1000 ng/mL Amphetamines + metabolites, urine  Cutoff 1000 ng/mL MDMA (Ecstasy), urine              Cutoff 500 ng/mL Cocaine Metabolite, urine          Cutoff 300 ng/mL Opiate + metabolites, urine        Cutoff 300 ng/mL Phencyclidine (PCP), urine         Cutoff 25 ng/mL Cannabinoid, urine                 Cutoff 50 ng/mL Barbiturates + metabolites, urine  Cutoff 200 ng/mL Benzodiazepine, urine              Cutoff 200 ng/mL Methadone, urine                   Cutoff 300 ng/mL The urine drug screen provides only a preliminary,  unconfirmed analytical test result and should not be used for non-medical purposes. Clinical consideration and professional judgment should be applied to any positive drug screen result due to possible interfering substances. A more specific alternate chemical method must be used in order to obtain a confirmed analytical result. Gas chromatography / mass spectrometry (GC/MS) is the preferred confirmat ory method. Performed at [facility], [address], [place], [place] [address]   SARS Coronavirus 2 (CEPHEID - Performed in [facility] hospital lab), Hosp Order     Status: None   Collection Time: 09/07/18 10:59 PM  Result Value Ref Range   SARS Coronavirus 2 NEGATIVE NEGATIVE    Comment: (NOTE) If result is NEGATIVE SARS-CoV-2 target nucleic acids are NOT DETECTED. The SARS-CoV-2 RNA is generally detectable in upper and lower  respiratory specimens during the acute phase of infection. The lowest  concentration of SARS-CoV-2 viral copies this assay can detect is 250  copies / mL. A negative result does not preclude SARS-CoV-2 infection  and should not be used as the sole basis for treatment or other  patient management decisions.  A negative result may occur with  improper specimen collection / handling, submission of  specimen other  than nasopharyngeal swab, presence of viral mutation(s) within the  areas targeted by this assay, and inadequate number of viral copies  (<250 copies / mL). A negative result must be combined with clinical  observations, patient history, and epidemiological information. If result is POSITIVE SARS-CoV-2 target nucleic acids are DETECTED. The SARS-CoV-2 RNA is generally detectable in upper and lower  respiratory specimens dur ing the acute phase of infection.  Positive  results are indicative of active infection with SARS-CoV-2.  Clinical  correlation with patient history and other diagnostic information is  necessary to determine patient  infection status.  Positive results do  not rule out bacterial infection or co-infection with other viruses. If result is PRESUMPTIVE POSTIVE SARS-CoV-2 nucleic acids MAY BE PRESENT.   A presumptive positive result was obtained on the submitted specimen  and confirmed on repeat testing.  While 2019 novel coronavirus  (SARS-CoV-2) nucleic acids may be present in the submitted sample  additional confirmatory testing may be necessary for epidemiological  and / or clinical management purposes  to differentiate between  SARS-CoV-2 and other Sarbecovirus currently known to infect humans.  If clinically indicated additional testing with an alternate test  methodology [phone]) is advised. The SARS-CoV-2 RNA is generally  detectable in upper and lower respiratory sp ecimens during the acute  phase of infection. The expected result is Negative. Fact Sheet for Patients:  StrictlyIdeas.no Fact Sheet for Healthcare Providers: BankingDealers.co.za This test is not yet approved or cleared by the [place] FDA and has been authorized for detection and/or diagnosis of SARS-CoV-2 by FDA under an Emergency Use Authorization (EUA).  This EUA will remain in effect (meaning this test can be used) for the duration of the COVID-19 declaration under Section 564(b)(1) of the Act, 21 U.S.C. section 360bbb-3(b)(1), unless the authorization is terminated or revoked sooner. Performed at [facility], [address] [place], [place] [address]     Blood Alcohol level:  Lab Results  Component Value Date   [facility] <10 09/07/2018   ETH <5 24/46/2863    Metabolic Disorder Labs: Lab Results  Component Value Date   HGBA1C 5.5 02/22/2015   MPG 111 02/22/2015   Lab Results  Component Value Date   PROLACTIN 3.3 04/13/2014   Lab Results  Component Value Date   CHOL 173 10/29/2017   TRIG 69 10/29/2017   HDL 69 10/29/2017   CHOLHDL 2.5 10/29/2017    LDLCALC 90 10/29/2017    Physical Findings: AIMS: Facial and Oral Movements Muscles of Facial Expression: None, normal Lips and Perioral Area: None, normal Jaw: None, normal Tongue: None, normal,Extremity Movements Upper (arms, wrists, hands, fingers): None, normal Lower (legs, knees, ankles, toes): None, normal, Trunk Movements Neck, shoulders, hips: None, normal, Overall Severity Severity of abnormal movements (highest score from questions above): None, normal Incapacitation due to abnormal movements: None, normal Patient's awareness of abnormal movements (rate only patient's report): No Awareness, Dental Status Current problems with teeth and/or dentures?: No Does patient usually wear dentures?: No  CIWA:  CIWA-Ar Total: 13 COWS:     Musculoskeletal: Strength & Muscle Tone: within normal limits Gait & Station: normal Patient leans: N/A  Psychiatric Specialty Exam: Physical Exam  Nursing note and vitals reviewed. Constitutional: She appears well-developed and well-nourished.  HENT:  Head: Normocephalic and atraumatic.  Eyes: Pupils are equal, round, and reactive to light. Conjunctivae are normal.  Neck: Normal range of motion.  Cardiovascular: Regular rhythm and normal heart sounds.  Respiratory: Effort normal.  GI: Soft.  Musculoskeletal: Normal range of motion.  Neurological: She is alert.  Skin: Skin is warm and dry.  Psychiatric: Her affect is labile. Her speech is rapid and/or pressured and tangential. She is agitated. She is not aggressive. Thought content is paranoid. Cognition and memory are normal. She expresses impulsivity. She expresses suicidal ideation.    Review of Systems  Constitutional: Negative.   HENT: Negative.   Eyes: Negative.   Respiratory: Negative.   Cardiovascular: Negative.   Gastrointestinal: Negative.   Musculoskeletal: Negative.   Skin: Negative.   Neurological: Negative.   Psychiatric/Behavioral: Positive for depression and suicidal  ideas. Negative for hallucinations and substance abuse. The patient is nervous/anxious and has insomnia.     Blood pressure 116/84, pulse 68, temperature (!) 97.5 F (36.4 C), temperature source Oral, resp. rate 16, height '5\' 8"'$  (1.727 m), weight 61.7 kg, last menstrual period 11/21/2014, SpO2 100 %.Body mass index is 20.68 kg/m.  General Appearance: Casual  Eye Contact:  Good  Speech:  Pressured  Volume:  Increased  Mood:  Irritable  Affect:  Labile  Thought Process:  Disorganized  Orientation:  Full (Time, Place, and Person)  Thought Content:  Tangential  Suicidal Thoughts:  Yes.  with intent/plan  Homicidal Thoughts:  No  Memory:  Immediate;   Fair Recent;   Fair Remote;   Fair  Judgement:  Fair  Insight:  Fair  Psychomotor Activity:  Increased  Concentration:  Concentration: Fair  Recall:  [employer] of Knowledge:  Fair  Language:  Fair  Akathisia:  No  Handed:  Right  AIMS (if indicated):     Assets:  Desire for Improvement Financial Resources/Insurance Housing Resilience  ADL's:  Intact  Cognition:  WNL  Sleep:  Number of Hours: 5.5     Treatment Plan Summary: Daily contact with patient to assess and evaluate symptoms and progress in treatment, Medication management and Plan Patient appears a little bit calmer and more straightforward than yesterday.  Still speaks very fast and is tangential.  Does not however appear to be psychotic.  Does not emphasize her suicidality as much today.  Once I agreed to all of the request she had she was appropriate.  Encourage patient to speak with the rest of the staff to be calm and present her requests and needs appropriately.  Today she is starting to talk about discharge and I reminded her that it was her who said that she would hang herself if she were not stable.  Patient says she is working on it and hopes she will feel better soon.  Additionally I brought up the point that the last time she took lithium was several years ago and  that now she is postmenopausal so her concern about vaginal bleeding as a side effect may be less crucial.  She will think about it.  Alethia Berthold, MD 09/09/2018, 11:51 AM

## 2018-09-09 NOTE — Progress Notes (Signed)
Patient alert and oriented x 4, affect is blunted, she denies SI/HI/AVH, appears restless , pacing the unit and anxious, when approached by writer she stated " I need something for pain" patient was medicated with ibuprofen she expressed relief. Patient's thoughts are organized, speech is pressured and tangential, appears hyper verbal, expressed racing thoughts. Patient was offered emotional support and encouraged to attend evening wrap up group. Patient attended evening group, she was appropriate, complaint with medications , she appears more receptive with staff, less irritable calmer and not suspicious of staff. 15 minutes safety checks maintained will continue to monitor.

## 2018-09-09 NOTE — Plan of Care (Signed)
Pt denies depression, anxiety, SI, HI and AVH. Pt was educated on care plan and verbalizes understanding Water engineer Problem: Education: Goal: Knowledge of [place] Education information/materials will improve Outcome: Progressing Goal: Emotional status will improve Outcome: Not Progressing Goal: Mental status will improve Outcome: Not Progressing Goal: Verbalization of understanding the information provided will improve Outcome: Progressing   Problem: Activity: Goal: Interest or engagement in activities will improve Outcome: Progressing Goal: Sleeping patterns will improve Outcome: Progressing   Problem: Coping: Goal: Ability to verbalize frustrations and anger appropriately will improve Outcome: Progressing   Problem: Health Behavior/Discharge Planning: Goal: Identification of resources available to assist in meeting health care needs will improve Outcome: Progressing Goal: Compliance with treatment plan for underlying cause of condition will improve Outcome: Progressing   Problem: Physical Regulation: Goal: Ability to maintain clinical measurements within normal limits will improve Outcome: Progressing   Problem: Safety: Goal: Periods of time without injury will increase Outcome: Progressing   Problem: Education: Goal: Utilization of techniques to improve thought processes will improve Outcome: Progressing Goal: Knowledge of the prescribed therapeutic regimen will improve Outcome: Progressing   Problem: Activity: Goal: Interest or engagement in leisure activities will improve Outcome: Progressing Goal: Imbalance in normal sleep/wake cycle will improve Outcome: Not Progressing   Problem: Coping: Goal: Coping ability will improve Outcome: Not Progressing Goal: Will verbalize feelings Outcome: Progressing   Problem: Health Behavior/Discharge Planning: Goal: Ability to make decisions will improve Outcome: Not Progressing Goal: Compliance with  therapeutic regimen will improve Outcome: Progressing   Problem: Role Relationship: Goal: Will demonstrate positive changes in social behaviors and relationships Outcome: Progressing   Problem: Safety: Goal: Ability to disclose and discuss suicidal ideas will improve Outcome: Progressing Goal: Ability to identify and utilize support systems that promote safety will improve Outcome: Progressing   Problem: Self-Concept: Goal: Will verbalize positive feelings about self Outcome: Progressing Goal: Level of anxiety will decrease Outcome: Progressing   Problem: Education: Goal: Will be free of psychotic symptoms Outcome: Not Progressing Goal: Knowledge of the prescribed therapeutic regimen will improve Outcome: Progressing   Problem: Coping: Goal: Coping ability will improve Outcome: Not Progressing Goal: Will verbalize feelings Outcome: Progressing   Problem: Health Behavior/Discharge Planning: Goal: Compliance with prescribed medication regimen will improve Outcome: Progressing   Problem: Nutritional: Goal: Ability to achieve adequate nutritional intake will improve Outcome: Progressing   Problem: Role Relationship: Goal: Ability to communicate needs accurately will improve Outcome: Progressing Goal: Ability to interact with others will improve Outcome: Progressing   Problem: Safety: Goal: Ability to redirect hostility and anger into socially appropriate behaviors will improve Outcome: Progressing Goal: Ability to remain free from injury will improve Outcome: Progressing   Problem: Self-Care: Goal: Ability to participate in self-care as condition permits will improve Outcome: Progressing   Problem: Self-Concept: Goal: Will verbalize positive feelings about self Outcome: Progressing

## 2018-09-09 NOTE — Progress Notes (Signed)
CSW spoke with pts step-mother who called asking if pt had been discharged. CSW informed step-mother that pt is still on the unit and that the system has been up and down so far today and that may be why pt did not call her between 12 and 1PM. Step-mother reported she understood and would see if pt called her this evening.  Evalina Field, MSW, LCSW Clinical Social Work 09/09/2018 1:25 PM

## 2018-09-10 DIAGNOSIS — F3113 Bipolar disorder, current episode manic without psychotic features, severe: Principal | ICD-10-CM

## 2018-09-10 MED ORDER — CLONAZEPAM 0.5 MG PO TABS: 0.5000 mg | ORAL_TABLET | Freq: Every day | ORAL | 0 refills | Status: DC | PRN

## 2018-09-10 NOTE — BHH Counselor (Signed)
Patient asked for CSW to transfer her belongings in the papersack to her back pack.  CSW did so in view of patient who was standing in the window of the nurses station.  CSW showed the patient the empty paper bag afterwards to confirm all items were in bag.  Patient agreed and was okay.   Patient asked for cab to be canceled and her mother will pick her up and transport to [place].    Assunta Curtis, MSW, LCSW 09/10/2018 3:28 PM

## 2018-09-10 NOTE — Progress Notes (Signed)
D: Pt during assessments denies HI/AVH, but reports passive suicidal thoughts of hanging herself if, "treatment doesn't work for me this time". Pt. Able to contract for safety while on the unit. Pt. Overall mood described as dysphoric, but mood is labile. Pt. Speech is pressured and thought process is tangential overall. Pt. Has frequent and multiple demands of staff for generalized somatic complaints. Pt. Complaints frequently attempted to be satisfied.   A: Q x 15 minute observation checks were completed for safety. Patient was provided with education. Patient was given/offered medications per orders. Patient  was encourage to attend groups, participate in unit activities and continue with plan of care. Pt. Chart and plans of care reviewed. Pt. Given support and encouragement.   R: Patient is complaint with medications with lots of direction and encouragement. Pt. Participation with unit activities and groups improved this evening with peers overall. Pt. Frequently observed pacing hyperactively around the unit and up at the nursing station intrusive to staff, but mostly redirectable. Pt. Pain being addressed utilizing MD orders. Pt. Up early this morning, observed pacing back and forth in the hallways.             Precautionary checks every 15 minutes for safety maintained, room free of safety hazards, patient sustains no injury or falls during this shift. Will endorse care to next shift.

## 2018-09-10 NOTE — Progress Notes (Signed)
  [facility] Adult Case Management Discharge Plan :  Will you be returning to the same living situation after discharge:  Yes,  lives alone At discharge, do you have transportation home?: Yes,  will be provided with taxi voucher Do you have the ability to pay for your medications: Yes,  insurance  Release of information consent forms completed and in the chart;  Patient's signature needed at discharge.  Patient to Follow up at: [place], [facility] Follow up on 09/16/2018.   Specialty:  Behavioral Health Why:  Please follow up with [name] for psychiatry on Thursday, June 11th at 8:20am and Theodoro Parma  for therapy on Monday, June 15th at 9:00am. Please take your hospital discharge paperwork with you to your appointment. Thank you. Contact information: [address] [place] [address] [phone]           Next level of care provider has access to [place] and Suicide Prevention discussed: Yes,  Savayah Waltrip, mother  Have you used any form of tobacco in the last 30 days? (Cigarettes, Smokeless Tobacco, Cigars, and/or Pipes): No  Has patient been referred to the Quitline?: N/A patient is not a smoker  Patient has been referred for addiction treatment: N/A  Yvette Rack, LCSW 09/10/2018, 1:15 PM

## 2018-09-10 NOTE — Plan of Care (Signed)
Pt. Participation around the unit with activities is improved and more appropriate with peers. Pt. Is complaint with medications with direction and encouragement. Pt. Able to disclose suicidal feelings, but reports passive suicidal thoughts. Pt. Able to contract for safety on the unit. Pt. Continues to present tangential, pressured in her speech, and at times disorganized in her thought process. Pt. Is intrusive up at the nursing station frequently.    Problem: Education: Goal: Emotional status will improve Outcome: Not Progressing Goal: Mental status will improve Outcome: Not Progressing   Problem: Coping: Goal: Ability to demonstrate self-control will improve Outcome: Not Progressing   Problem: Education: Goal: Will be free of psychotic symptoms Outcome: Not Progressing   Problem: Activity: Goal: Interest or engagement in activities will improve Outcome: Progressing   Problem: Health Behavior/Discharge Planning: Goal: Compliance with treatment plan for underlying cause of condition will improve Outcome: Progressing   Problem: Safety: Goal: Ability to disclose and discuss suicidal ideas will improve Outcome: Progressing

## 2018-09-10 NOTE — Plan of Care (Signed)
Pt rates depression 6/10 and anxiety 7/10. Pt has passive thoughts of SI but no plan. Pt denies HI, and AVH. Pt stated she slept "normal amount of hours"... six". Pt has a goal "to get stable". Pt has been educated on care plan and verbalizes understanding. Collier Bullock RN Problem: Education: Goal: Knowledge of [place] [facility] Education information/materials will improve Outcome: Progressing Goal: Emotional status will improve Outcome: Not Progressing Goal: Mental status will improve Outcome: Not Progressing Goal: Verbalization of understanding the information provided will improve Outcome: Progressing   Problem: Activity: Goal: Interest or engagement in activities will improve Outcome: Progressing Goal: Sleeping patterns will improve Outcome: Progressing   Problem: Coping: Goal: Ability to verbalize frustrations and anger appropriately will improve Outcome: Not Progressing Goal: Ability to demonstrate self-control will improve Outcome: Not Progressing   Problem: Health Behavior/Discharge Planning: Goal: Identification of resources available to assist in meeting health care needs will improve Outcome: Not Progressing Goal: Compliance with treatment plan for underlying cause of condition will improve Outcome: Not Progressing   Problem: Physical Regulation: Goal: Ability to maintain clinical measurements within normal limits will improve Outcome: Not Progressing   Problem: Safety: Goal: Periods of time without injury will increase Outcome: Progressing   Problem: Education: Goal: Utilization of techniques to improve thought processes will improve Outcome: Not Progressing Goal: Knowledge of the prescribed therapeutic regimen will improve Outcome: Not Progressing

## 2018-09-10 NOTE — BHH Suicide Risk Assessment (Signed)
[facility] Discharge Suicide Risk Assessment   Principal Problem: Bipolar affective disorder, manic, severe ([place]) Discharge Diagnoses: Principal Problem:   Bipolar affective disorder, manic, severe ([place]) Active Problems:   Posttraumatic stress disorder   Cocaine abuse ([place])   Amphetamine abuse ([place])   Chronic prescription benzodiazepine use   Suicidal ideation   Total Time spent with patient: 45 minutes  Musculoskeletal: Strength & Muscle Tone: within normal limits Gait & Station: normal Patient leans: N/A  Psychiatric Specialty Exam: Review of Systems  Constitutional: Negative.   HENT: Negative.   Eyes: Negative.   Respiratory: Negative.   Cardiovascular: Negative.   Gastrointestinal: Negative.   Musculoskeletal: Positive for back pain.  Skin: Negative.   Neurological: Negative.   Psychiatric/Behavioral: Negative for depression, hallucinations, memory loss, substance abuse and suicidal ideas. The patient is nervous/anxious. The patient does not have insomnia.     Blood pressure 124/78, pulse 66, temperature 98.5 F (36.9 C), temperature source Oral, resp. rate 17, height 5\' 8"  (1.727 m), weight 61.7 kg, last menstrual period 11/21/2014, SpO2 98 %.Body mass index is 20.68 kg/m.  General Appearance: Fairly Groomed  Engineer, water::  Good  Speech:  Clear and [id number]  Volume:  Normal  Mood:  Anxious and Euthymic  Affect:  Congruent  Thought Process:  Goal Directed  Orientation:  Full (Time, Place, and Person)  Thought Content:  Logical  Suicidal Thoughts:  No  Homicidal Thoughts:  No  Memory:  Immediate;   Fair Recent;   Fair Remote;   Fair  Judgement:  Fair  Insight:  Fair  Psychomotor Activity:  Normal  Concentration:  Fair  Recall:  [employer]  Language: Fair  Akathisia:  No  Handed:  Right  AIMS (if indicated):     Assets:  Desire for Improvement Financial Resources/Insurance Housing Physical Health Resilience Social Support  Sleep:   Number of Hours: 4.5  Cognition: WNL  ADL's:  Intact   Mental Status Per Nursing Assessment::   On Admission:  Self-harm thoughts  Demographic Factors:  Caucasian  Loss Factors: No particular significant new stress other than the chronic illness  Historical Factors: Prior suicide attempts and Impulsivity  Risk Reduction Factors:   Living with another person, especially a relative, Positive social support and Positive therapeutic relationship  Continued Clinical Symptoms:  Bipolar Disorder:   Mixed State  Cognitive Features That Contribute To Risk:  Polarized thinking    Suicide Risk:  Minimal: No identifiable suicidal ideation.  Patients presenting with no risk factors but with morbid ruminations; may be classified as minimal risk based on the severity of the depressive symptoms  [place], [facility] Follow up on 09/16/2018.   Specialty:  Behavioral Health Why:  Please follow up with [name] for psychiatry on Thursday, June 11th at 8:20am and Theodoro Parma  for therapy on Monday, June 15th at 9:00am. Please take your hospital discharge paperwork with you to your appointment. Thank you. Contact information: [place] [address] [phone]           Plan Of Care/Follow-up recommendations:  Activity:  Activity as tolerated Diet:  Diet as preferred.  She is a vegan. Other:  Patient has follow-up treatment in the community with her outpatient psychiatric nurse practitioner and therapist with whom she will follow-up within the next week.  Patient is able to articulate a clear plan for her own self-care going forward and does not report any  current feeling of hopelessness or desperation or wish to die.  Alethia Berthold, MD 09/10/2018, 1:19 PM

## 2018-09-10 NOTE — Progress Notes (Signed)
Recreation Therapy Notes  Date: 09/10/2018   Time: 9:30 am   Location: Craft room   Behavioral response: N/A   Intervention Topic: Relaxation   Discussion/Intervention: Patient did not attend group.   Clinical Observations/Feedback:  Patient did not attend group.   Zaia Carre LRT/CTRS        Ryshawn Sanzone 09/10/2018 10:41 AM

## 2018-09-10 NOTE — Progress Notes (Signed)
Pt denies SI, HI and AVH. Pt was educated on dc instructions.and verbalized understanding.  Pt received belongings, suicide risk assessment, transition record and prescriptions. Pt was dc and picked up by her mother. Collier Bullock RN

## 2018-09-10 NOTE — Discharge Summary (Signed)
Physician Discharge Summary Note  Patient:  Catherine Olsen is an 48 y.o., female MRN:  [id number] DOB:  1971/03/18 Patient phone:  [phone] (home)  Patient address:   [place] [address],  Total Time spent with patient: 45 minutes  Date of Admission:  09/08/2018 Date of Discharge: September 10, 2018  Reason for Admission: Admitted through the emergency room where she presented voluntarily stating that she was having manic symptoms but also suicidal thoughts.  Principal Problem: Bipolar affective disorder, manic, severe ([place]) Discharge Diagnoses: Principal Problem:   Bipolar affective disorder, manic, severe ([place]) Active Problems:   Posttraumatic stress disorder   Cocaine abuse ([place])   Amphetamine abuse ([place])   Chronic prescription benzodiazepine use   Suicidal ideation   Past Psychiatric History: Patient has a longstanding history of a diagnosis of bipolar disorder.  Has had multiple prior hospitalizations.  Has tried multiple prior medications and has had a difficult time tolerating medicines.  She is currently followed by a psychiatric nurse practitioner and therapist in [place].  Patient's current medications are multiple medical treatments and a low dose of Abilify which she adjusts by her own feeling as well as as needed doses of several benzodiazepines.  She does have a past history of substance abuse.  Sounds like that still is an issue she struggles with somewhat.  Past Medical History:  Past Medical History:  Diagnosis Date  . Abnormal uterine bleeding (AUB) 06/25/2009   Qualifier: Diagnosis of  By: Cathren Laine MD, Ankit    . Allergy   . Anxiety   . Arthritis    hands, lower back, knees  . Asthma   . Bipolar 1 disorder ([place])   . COPD (chronic obstructive pulmonary disease) ([place])   . Depression   . Endometriosis   . Fibromyalgia   . Headache(784.0)    otc med prn  . Migraine   . Pituitary tumor    microadenoma  . PONV (postoperative nausea and vomiting)    . PTSD (post-traumatic stress disorder)   . Scoliosis   . Termination of pregnancy    x 2 at age 76 and 48 yrs old  . Tobacco abuse 03/04/2015    Past Surgical History:  Procedure Laterality Date  . BREAST BIOPSY Right 05/19/2007  . BREAST BIOPSY Right 06/02/2007  . BREAST SURGERY    . DILATION AND CURETTAGE OF UTERUS    . FACIAL COSMETIC SURGERY     right cheek  . LAPAROSCOPY Right 11/11/2013   Procedure: LAPAROSCOPY OPERATIVE with  Drainage of  RIGHT Ovarian ENDOMETRIOMA;  Surgeon: Elveria Royals, MD;  Location: [facility];  Service: Gynecology;  Laterality: Right;  . NOSE SURGERY     rhinoplasty at age 52 yrs  . WISDOM TOOTH EXTRACTION     Family History:  Family History  Problem Relation Age of Onset  . Diabetes Mother   . Hypertension Mother   . Stroke Mother 34       CVA  . Mental illness Mother        no diagnosis; personality disorder  . Hyperlipidemia Father   . Hypertension Father   . COPD Father   . Alpha-1 antitrypsin deficiency Father   . Asthma Father   . Alpha-1 antitrypsin deficiency Brother   . Asthma Brother   . Diabetes Maternal Grandmother   . Heart disease Maternal Grandmother   . Hyperlipidemia Maternal Grandmother   . Hypertension Maternal Grandmother   . Mental illness Maternal Grandmother   .  Heart disease Maternal Grandfather   . Hyperlipidemia Maternal Grandfather   . Hypertension Maternal Grandfather   . Asthma Maternal Grandfather   . Heart disease Paternal Grandfather   . Hyperlipidemia Paternal Grandfather   . Hypertension Paternal Grandfather   . Stroke Paternal Grandfather   . Alzheimer's disease Paternal Grandmother   . Allergic rhinitis Neg Hx   . Angioedema Neg Hx   . Eczema Neg Hx   . Urticaria Neg Hx   . Immunodeficiency Neg Hx    Family Psychiatric  History: From what she describes it sounds like she has had several family members who have had mental health problems such as bipolar disorder Social History:  Social History    Substance and Sexual Activity  Alcohol Use Not Currently     Social History   Substance and Sexual Activity  Drug Use Yes  . Types: "Crack" cocaine   Comment: Heavy user per patient, last use Tuesday 11/08/13  LAST CRACK  2015    Social History   Socioeconomic History  . Marital status: Divorced    Spouse name: Not on file  . Number of children: 0  . Years of education: Not on file  . Highest education level: Bachelor's degree (e.g., BA, AB, BS)  Occupational History  . Occupation: disability    Comment: mental illness  Social Needs  . Financial resource strain: Not on file  . Food insecurity:    Worry: Not on file    Inability: Not on file  . Transportation needs:    Medical: Not on file    Non-medical: Not on file  Tobacco Use  . Smoking status: Current Some Day Smoker    Packs/day: 2.00    Years: 29.00    Pack years: 58.00    Types: Cigarettes  . Smokeless tobacco: Never Used  Substance and Sexual Activity  . Alcohol use: Not Currently  . Drug use: Yes    Types: "Crack" cocaine    Comment: Heavy user per patient, last use Tuesday 11/08/13  LAST CRACK  2015  . Sexual activity: Not Currently    Comment: abortion at 81 and 33yr. of age   Lifestyle  . Physical activity:    Days per week: Not on file    Minutes per session: Not on file  . Stress: Not on file  Relationships  . Social connections:    Talks on phone: Not on file    Gets together: Not on file    Attends religious service: Not on file    Active member of club or organization: Not on file    Attends meetings of clubs or organizations: Not on file    Relationship status: Not on file  Other Topics Concern  . Not on file  Social History Narrative   Marital status: divorced; not dating      Children: none      Lives: alone with mom      Employment: disability for mental illness in 1997.  Hospitalizations x 9 in past.      Tobacco: 1 ppd x since age 48 Decreasing in 2018.      Alcohol: socially;  beers.      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      Exercise: yoga daily; exercise daily.      ADLs: independent with ADLs; no car; depends on others for transportation.      Patient is right-handed. She is divorced and lives with her mother.  She drinks 2-3 cups of 1/2 caffeine coffe a day. She walks occasionally for exercise.    Hospital Course: Admitted to the psychiatric ward.  15-minute checks used.  Patient met with physician and other members of the treatment team.  Medications were adjusted based on her symptoms and her history and her knowledge of her own reactions to medicine.  Patient did not show any dangerous or aggressive behavior.  She did not show any overt psychotic symptoms.  When she first came in she spoke about how if she could not become "stable" she thought she would go hang herself.  By the day of discharge patient was saying that she felt much more optimistic and upbeat.  Had no actual thought or intention of harming her self.  She came to me today and told me that her back was in a great deal of pain which she blamed on the fact that we have concrete floors and uncomfortable beds.  She and I talked about various medication options at which point she suggested that she be discharged and go stay with her father.  She says if she is feeling much worse she will come back to the emergency room.  Meanwhile she will follow-up with her outpatient mental health providers next week.  At this point patient appears to be lucid and in her right mind and able to make decisions for herself.  Does not appear to be at elevated risk that would justify forcibly keeping her in the hospital.  Patient will be discharged.  At her request I gave her a small prescription for 0.5 mg Klonopin's to carry her through until she sees her doctor next week no other prescriptions given  Physical Findings: AIMS: Facial and Oral Movements Muscles of Facial Expression: None, normal Lips  and Perioral Area: None, normal Jaw: None, normal Tongue: None, normal,Extremity Movements Upper (arms, wrists, hands, fingers): None, normal Lower (legs, knees, ankles, toes): None, normal, Trunk Movements Neck, shoulders, hips: None, normal, Overall Severity Severity of abnormal movements (highest score from questions above): None, normal Incapacitation due to abnormal movements: None, normal Patient's awareness of abnormal movements (rate only patient's report): No Awareness, Dental Status Current problems with teeth and/or dentures?: No Does patient usually wear dentures?: No  CIWA:  CIWA-Ar Total: 13 COWS:     Musculoskeletal: Strength & Muscle Tone: within normal limits Gait & Station: normal Patient leans: N/A  Psychiatric Specialty Exam: Physical Exam  Nursing note and vitals reviewed. Constitutional: She appears well-developed and well-nourished.  HENT:  Head: Normocephalic and atraumatic.  Eyes: Pupils are equal, round, and reactive to light. Conjunctivae are normal.  Neck: Normal range of motion.  Cardiovascular: Regular rhythm and normal heart sounds.  Respiratory: Effort normal.  GI: Soft.  Musculoskeletal: Normal range of motion.  Neurological: She is alert.  Skin: Skin is warm and dry.  Psychiatric: Her behavior is normal. Judgment and thought content normal. Her mood appears anxious. Her speech is rapid and/or pressured. Cognition and memory are normal.    Review of Systems  Constitutional: Negative.   HENT: Negative.   Eyes: Negative.   Respiratory: Negative.   Cardiovascular: Negative.   Gastrointestinal: Negative.   Musculoskeletal: Positive for back pain.  Skin: Negative.   Neurological: Negative.   Psychiatric/Behavioral: Negative for depression, hallucinations, substance abuse and suicidal ideas. The patient is nervous/anxious.     Blood pressure 124/78, pulse 66, temperature 98.5 F (36.9 C), temperature source Oral, resp. rate 17, height _0   (1.727 m),  weight 61.7 kg, last menstrual period 11/21/2014, SpO2 98 %.Body mass index is 20.68 kg/m.  General Appearance: Casual  Eye Contact:  Good  Speech:  Clear and Coherent  Volume:  Normal  Mood:  Euthymic  Affect:  Congruent  Thought Process:  Goal Directed  Orientation:  Full (Time, Place, and Person)  Thought Content:  Logical  Suicidal Thoughts:  No  Homicidal Thoughts:  No  Memory:  Immediate;   Fair Recent;   Fair Remote;   Fair  Judgement:  Fair  Insight:  Fair  Psychomotor Activity:  Normal  Concentration:  Concentration: Fair  Recall:  [place] of Knowledge:  Fair  Language:  Fair  Akathisia:  No  Handed:  Right  AIMS (if indicated):     Assets:  Communication Skills Desire for Improvement Financial Resources/Insurance Housing Resilience Social Support  ADL's:  Intact  Cognition:  WNL  Sleep:  Number of Hours: 4.5     Have you used any form of tobacco in the last 30 days? (Cigarettes, Smokeless Tobacco, Cigars, and/or Pipes): No  Has this patient used any form of tobacco in the last 30 days? (Cigarettes, Smokeless Tobacco, Cigars, and/or Pipes) Yes, No  Blood Alcohol level:  Lab Results  Component Value Date   ETH <10 09/07/2018   ETH <5 17/91/5056    Metabolic Disorder Labs:  Lab Results  Component Value Date   HGBA1C 5.5 02/22/2015   MPG 111 02/22/2015   Lab Results  Component Value Date   PROLACTIN 3.3 04/13/2014   Lab Results  Component Value Date   CHOL 173 10/29/2017   TRIG 69 10/29/2017   HDL 69 10/29/2017   CHOLHDL 2.5 10/29/2017   [place] 90 10/29/2017    See Psychiatric Specialty Exam and Suicide Risk Assessment completed by Attending Physician prior to discharge.  Discharge destination:  Home  Is patient on multiple antipsychotic therapies at discharge:  No   Has Patient had three or more failed trials of antipsychotic monotherapy by history:  No  Recommended Plan for Multiple Antipsychotic  Therapies: NA  Discharge Instructions    Diet - low sodium heart healthy   Complete by:  As directed    Increase activity slowly   Complete by:  As directed      Allergies as of 09/10/2018      Reactions   Other Anaphylaxis   Idiopathic (possible binder or preservatives in medication)   Prednisone Anaphylaxis   Steroids Steroids ( can take with benadryl )   Can not take higher than 40 mg   Sulfamethoxazole Anaphylaxis   Oxycodone Other (See Comments)   Severe blindness?   Abilify [aripiprazole]    Generic Abilify--causes severe neurological problems. Can use name brand   Nicotine Swelling   Face swelled around implants when tried to quit smoking   Penicillins Hives   Has patient had a PCN reaction causing immediate rash, facial/tongue/throat swelling, SOB or lightheadedness with hypotension: no Has patient had a PCN reaction causing severe rash involving mucus membranes or skin necrosis: NO Has patient had a PCN reaction that required hospitalizationNO Has patient had a PCN reaction occurring within the last 10 years:NO If all of the above answers are "NO", then may proceed with Cephalosporin use.   Percocet [oxycodone-acetaminophen] Palpitations   Sulfa Antibiotics Hives   Pt states she is not really allergic to sulfa, but to sulfites.   Sulfites Other (See Comments)   unknown      Medication List  STOP taking these medications   acetaminophen 325 MG tablet Commonly known as:  TYLENOL   nicotine 14 mg/24hr patch Commonly known as:  NICODERM CQ - dosed in mg/24 hours     TAKE these medications     Indication  Abilify 5 MG tablet Generic drug:  ARIPiprazole Take 2.5-5 mg by mouth daily at 12 noon.  Indication:  MIXED BIPOLAR AFFECTIVE DISORDER   albuterol 108 (90 Base) MCG/ACT inhaler Commonly known as:  Ventolin HFA Inhale 2 puffs into the lungs every 4 (four) hours as needed for wheezing or shortness of breath.  Indication:  Asthma   ALPRAZolam 1 MG  tablet Commonly known as:  XANAX Take 1 mg by mouth 3 (three) times daily.  Indication:  Feeling Anxious   Calcium Citrate 200 MG Tabs Take 600 mg by mouth 2 (two) times daily.  Indication:  Low Amount of Calcium in the Blood   celecoxib 200 MG capsule Commonly known as:  CELEBREX Take 200 mg by mouth daily.  Indication:  Arthritis   Claritin 10 MG tablet Generic drug:  loratadine Take 10 mg by mouth daily.  Indication:  Upper Respiratory Tract Allergy   clonazePAM 1 MG tablet Commonly known as:  KLONOPIN Take 1 mg by mouth 2 (two) times daily. What changed:  Another medication with the same name was added. Make sure you understand how and when to take each.  Indication:  Manic-Depression   clonazePAM 0.5 MG tablet Commonly known as:  KLONOPIN Take 1 tablet (0.5 mg total) by mouth daily as needed for anxiety (sleep). What changed:  You were already taking a medication with the same name, and this prescription was added. Make sure you understand how and when to take each.  Indication:  Manic-Depression   doxycycline 100 MG EC tablet Commonly known as:  DORYX Take 100 mg by mouth 2 (two) times daily.  Indication:  Periodontitis   EPINEPHrine 0.3 mg/0.3 mL Soaj injection Commonly known as:  EPI-PEN Inject 0.3 mLs into the skin once.  Indication:  Life-Threatening Hypersensitivity Reaction   estradiol 0.025 mg/24hr patch Commonly known as:  CLIMARA - Dosed in mg/24 hr Place 0.025 mg onto the skin once a week.  Indication:  Deficiency of the Hormone Estrogen   famotidine 20 MG tablet Commonly known as:  Pepcid Take 1 tablet twice a day for reflux, may help hives  Indication:  Gastroesophageal Reflux Disease   fluticasone 50 MCG/ACT nasal spray Commonly known as:  FLONASE 1 spray per nostril twice a day if needed for stuffy nose  Indication:  Allergic Rhinitis   HYDROcodone-acetaminophen 5-325 MG tablet Commonly known as:  Norco Take 1 tablet by mouth every 6 (six)  hours as needed for moderate pain.  Indication:  Pain   ibuprofen 800 MG tablet Commonly known as:  ADVIL TAKE 1 TABLET(800 MG) BY MOUTH EVERY 8 HOURS AS NEEDED What changed:  See the new instructions.  Indication:  Pain   magnesium gluconate 500 MG tablet Commonly known as:  MAGONATE Take 500 mg by mouth daily.  Indication:  low magnesium   multivitamin capsule Take 1 capsule by mouth daily.  Indication:  vitamins   progesterone 200 MG capsule Commonly known as:  PROMETRIUM Take 200 mg by mouth daily.  Indication:  Overgrowth of the Uterine Lining   temazepam 15 MG capsule Commonly known as:  RESTORIL Take 45 mg by mouth at bedtime.  Indication:  Trouble Sleeping   vitamin C 500 MG tablet Commonly  known as:  ASCORBIC ACID Take 1,500 mg by mouth daily.  Indication:  vitamins   Vitamin D (Cholecalciferol) 50 MCG (2000 UT) Caps Take 2,000 Units by mouth 2 (two) times daily.  Indication:  vitamin d      [place], [facility] Follow up on 09/16/2018.   Specialty:  Behavioral Health Why:  Please follow up with [name] for psychiatry on Thursday, June 11th at 8:20am and Theodoro Parma  for therapy on Monday, June 15th at 9:00am. Please take your hospital discharge paperwork with you to your appointment. Thank you. Contact information: [address] [place] [phone]           Follow-up recommendations:  Activity:  Activity as tolerated Diet:  Her regular diet which is vegan Other:  Patient will follow up with her outpatient psychiatric providers next week and continue her current medicine  Comments: I did speak to [name], the patient's usual psychiatric medical provider.  She has been working with the patient for a long time.  Her description and understanding of the patient is in line with my understanding of the patient.  She did not have any other specific recommendations for  medications as she knew that the patient was unlikely to be willing to make major changes.  He quite willingly agrees to see the patient again regularly.  Signed: Alethia Berthold, MD 09/10/2018, 1:47 PM

## 2018-09-10 NOTE — [facility] Group Notes (Signed)
LCSW Group Therapy Note  09/10/2018 1:00 PM  Type of Therapy and Topic:  Group Therapy:  Feelings around Relapse and Recovery  Participation Level:  Minimal   Description of Group:    Patients in this group will discuss emotions they experience before and after a relapse. They will process how experiencing these feelings, or avoidance of experiencing them, relates to having a relapse. Facilitator will guide patients to explore emotions they have related to recovery. Patients will be encouraged to process which emotions are more powerful. They will be guided to discuss the emotional reaction significant others in their lives may have to their relapse or recovery. Patients will be assisted in exploring ways to respond to the emotions of others without this contributing to a relapse.  Therapeutic Goals: 1. Patient will identify two or more emotions that lead to a relapse for them 2. Patient will identify two emotions that result when they relapse 3. Patient will identify two emotions related to recovery 4. Patient will demonstrate ability to communicate their needs through discussion and/or role plays   Summary of Patient Progress: Patient attended group for a moment and discussed how people cal feel worthless. Patient left group stating that she needed to use the restroom but did not return.    Therapeutic Modalities:   Cognitive Behavioral Therapy Solution-Focused Therapy Assertiveness Training Relapse Prevention Therapy   Assunta Curtis, MSW, LCSW 09/10/2018 12:42 PM

## 2018-09-10 NOTE — Progress Notes (Signed)
Recreation Therapy Notes  INPATIENT RECREATION TR PLAN  Patient Details Name: Catherine Olsen MRN: [id number] DOB: 1970-06-01 Today's Date: 09/10/2018  Rec Therapy Plan Is patient appropriate for Therapeutic Recreation?: Yes Treatment times per week: at least 3 Estimated Length of Stay: 5-7 days TR Treatment/Interventions: Group participation (Comment)  Discharge Criteria Pt will be discharged from therapy if:: Discharged Treatment plan/goals/alternatives discussed and agreed upon by:: Patient/family  Discharge Summary Short term goals set: Patient will identify 3 positive coping skills strategies to use for SI post d/c within 5 recreation therapy group sessions Short term goals met: Adequate for discharge Progress toward goals comments: Groups attended Which groups?: Wellness, Goal setting Reason goals not met: N/A Therapeutic equipment acquired: N/A Reason patient discharged from therapy: Discharge from hospital Pt/family agrees with progress & goals achieved: Yes Date patient discharged from therapy: 09/10/18   Kimorah Ridolfi 09/10/2018, 1:46 PM

## 2018-09-16 DIAGNOSIS — F4312 Post-traumatic stress disorder, chronic: Secondary | ICD-10-CM | POA: Diagnosis not present

## 2018-09-16 DIAGNOSIS — F3163 Bipolar disorder, current episode mixed, severe, without psychotic features: Secondary | ICD-10-CM | POA: Diagnosis not present

## 2018-09-20 DIAGNOSIS — F3163 Bipolar disorder, current episode mixed, severe, without psychotic features: Secondary | ICD-10-CM | POA: Diagnosis not present

## 2018-09-20 DIAGNOSIS — F4312 Post-traumatic stress disorder, chronic: Secondary | ICD-10-CM | POA: Diagnosis not present

## 2018-09-23 DIAGNOSIS — F4312 Post-traumatic stress disorder, chronic: Secondary | ICD-10-CM | POA: Diagnosis not present

## 2018-09-23 DIAGNOSIS — F3163 Bipolar disorder, current episode mixed, severe, without psychotic features: Secondary | ICD-10-CM | POA: Diagnosis not present

## 2018-10-04 DIAGNOSIS — F3163 Bipolar disorder, current episode mixed, severe, without psychotic features: Secondary | ICD-10-CM | POA: Diagnosis not present

## 2018-10-04 DIAGNOSIS — F4312 Post-traumatic stress disorder, chronic: Secondary | ICD-10-CM | POA: Diagnosis not present

## 2018-10-05 DIAGNOSIS — F3163 Bipolar disorder, current episode mixed, severe, without psychotic features: Secondary | ICD-10-CM | POA: Diagnosis not present

## 2018-10-05 DIAGNOSIS — F4312 Post-traumatic stress disorder, chronic: Secondary | ICD-10-CM | POA: Diagnosis not present

## 2018-10-13 DIAGNOSIS — M47817 Spondylosis without myelopathy or radiculopathy, lumbosacral region: Secondary | ICD-10-CM | POA: Diagnosis not present

## 2018-10-13 DIAGNOSIS — M25572 Pain in left ankle and joints of left foot: Secondary | ICD-10-CM | POA: Diagnosis not present

## 2018-10-18 DIAGNOSIS — F4312 Post-traumatic stress disorder, chronic: Secondary | ICD-10-CM | POA: Diagnosis not present

## 2018-10-18 DIAGNOSIS — F3163 Bipolar disorder, current episode mixed, severe, without psychotic features: Secondary | ICD-10-CM | POA: Diagnosis not present

## 2018-10-31 ENCOUNTER — Telehealth: Payer: Medicare Other | Admitting: Family

## 2018-10-31 DIAGNOSIS — R05 Cough: Secondary | ICD-10-CM

## 2018-10-31 DIAGNOSIS — R059 Cough, unspecified: Secondary | ICD-10-CM

## 2018-10-31 MED ORDER — BENZONATATE 100 MG PO CAPS: 100.0000 mg | ORAL_CAPSULE | Freq: Three times a day (TID) | ORAL | 0 refills | Status: DC | PRN

## 2018-10-31 MED ORDER — DOXYCYCLINE HYCLATE 100 MG PO TABS: 100.0000 mg | ORAL_TABLET | Freq: Two times a day (BID) | ORAL | 0 refills | Status: DC

## 2018-10-31 NOTE — Progress Notes (Signed)
We are sorry that you are not feeling well.  Here is how we plan to help!  Based on your presentation I believe you most likely have A cough due to bacteria.  When patients have a fever and a productive cough with a change in color or increased sputum production, we are concerned about bacterial bronchitis.  If left untreated it can progress to pneumonia.  If your symptoms do not improve with your treatment plan it is important that you contact your provider.   I have prescribed Doxycycline 100 mg twice a day for 7 days     In addition you may use A non-prescription cough medication called Robitussin DAC. Take 2 teaspoons every 8 hours or Delsym: take 2 teaspoons every 12 hours., A non-prescription cough medication called Mucinex DM: take 2 tablets every 12 hours. and A prescription cough medication called Tessalon Perles 100mg . You may take 1-2 capsules every 8 hours as needed for your cough.    However, you need need to be tested for COVID-19.  You can go to one of the  testing sites listed below, while they are opened (see hours). You do need to self isolate until your results return and if positive 14 days from when your symptoms started and until you are 3 days symptom free.   Testing Locations (Monday - Friday, 8 a.m. - 3:30 p.m.) . [place]: [facility], [address] [place], [place]: [place], [place], [place], [place] (entrance off [address])  . [place], [place], [place] (across from [facility])  Approximately 5 minutes was spent documenting and reviewing patient's chart.    From your responses in the eVisit questionnaire you describe inflammation in the upper respiratory tract which is causing a significant cough.  This is commonly called Bronchitis and has four common causes:    Allergies  Viral Infections  Acid Reflux  Bacterial  Infection Allergies, viruses and acid reflux are treated by controlling symptoms or eliminating the cause. An example might be a cough caused by taking certain blood pressure medications. You stop the cough by changing the medication. Another example might be a cough caused by acid reflux. Controlling the reflux helps control the cough.  USE OF BRONCHODILATOR ("RESCUE") INHALERS: There is a risk from using your bronchodilator too frequently.  The risk is that over-reliance on a medication which only relaxes the muscles surrounding the breathing tubes can reduce the effectiveness of medications prescribed to reduce swelling and congestion of the tubes themselves.  Although you feel brief relief from the bronchodilator inhaler, your asthma may actually be worsening with the tubes becoming more swollen and filled with mucus.  This can delay other crucial treatments, such as oral steroid medications. If you need to use a bronchodilator inhaler daily, several times per day, you should discuss this with your provider.  There are probably better treatments that could be used to keep your asthma under control.     HOME CARE . Only take medications as instructed by your medical team. . Complete the entire course of an antibiotic. . Drink plenty of fluids and get plenty of rest. . Avoid close contacts especially the very young and the elderly . Cover your mouth if you cough or cough into your sleeve. . Always remember to wash your hands . A steam or ultrasonic humidifier can help congestion.   GET HELP RIGHT AWAY IF: .  You develop worsening fever. . You become short of breath . You cough up blood. . Your symptoms persist after you have completed your treatment plan MAKE SURE YOU   Understand these instructions.  Will watch your condition.  Will get help right away if you are not doing well or get worse.  Your e-visit answers were reviewed by a board certified advanced clinical practitioner to  complete your personal care plan.  Depending on the condition, your plan could have included both over the counter or prescription medications. If there is a problem please reply  once you have received a response from your provider. Your safety is important to [place].  If you have drug allergies check your prescription carefully.    You can use MyChart to ask questions about today's visit, request a non-urgent call back, or ask for a work or school excuse for 24 hours related to this e-Visit. If it has been greater than 24 hours you will need to follow up with your provider, or enter a new e-Visit to address those concerns. You will get an e-mail in the next two days asking about your experience.  I hope that your e-visit has been valuable and will speed your recovery. Thank you for using e-visits.

## 2018-11-01 DIAGNOSIS — F3163 Bipolar disorder, current episode mixed, severe, without psychotic features: Secondary | ICD-10-CM | POA: Diagnosis not present

## 2018-11-01 DIAGNOSIS — F4312 Post-traumatic stress disorder, chronic: Secondary | ICD-10-CM | POA: Diagnosis not present

## 2018-11-04 DIAGNOSIS — J069 Acute upper respiratory infection, unspecified: Secondary | ICD-10-CM | POA: Diagnosis not present

## 2018-11-08 DIAGNOSIS — F4312 Post-traumatic stress disorder, chronic: Secondary | ICD-10-CM | POA: Diagnosis not present

## 2018-11-08 DIAGNOSIS — F3163 Bipolar disorder, current episode mixed, severe, without psychotic features: Secondary | ICD-10-CM | POA: Diagnosis not present

## 2018-11-09 DIAGNOSIS — R220 Localized swelling, mass and lump, head: Secondary | ICD-10-CM | POA: Diagnosis not present

## 2018-11-11 DIAGNOSIS — K069 Disorder of gingiva and edentulous alveolar ridge, unspecified: Secondary | ICD-10-CM | POA: Diagnosis not present

## 2018-11-11 DIAGNOSIS — R220 Localized swelling, mass and lump, head: Secondary | ICD-10-CM | POA: Diagnosis not present

## 2018-11-13 DIAGNOSIS — M2769 Other endosseous dental implant failure: Secondary | ICD-10-CM | POA: Diagnosis not present

## 2018-11-15 DIAGNOSIS — F4312 Post-traumatic stress disorder, chronic: Secondary | ICD-10-CM | POA: Diagnosis not present

## 2018-11-15 DIAGNOSIS — F3163 Bipolar disorder, current episode mixed, severe, without psychotic features: Secondary | ICD-10-CM | POA: Diagnosis not present

## 2018-11-17 ENCOUNTER — Emergency Department (HOSPITAL_COMMUNITY)
Admission: EM | Admit: 2018-11-17 | Discharge: 2018-11-18 | Disposition: A | Discharge status: Home / Self Care | Payer: Medicare Other | Attending: Emergency Medicine | Admitting: Emergency Medicine

## 2018-11-17 ENCOUNTER — Other Ambulatory Visit: Payer: Self-pay

## 2018-11-17 ENCOUNTER — Encounter (HOSPITAL_COMMUNITY): Payer: Self-pay

## 2018-11-17 DIAGNOSIS — F17210 Nicotine dependence, cigarettes, uncomplicated: Secondary | ICD-10-CM | POA: Insufficient documentation

## 2018-11-17 DIAGNOSIS — R600 Localized edema: Secondary | ICD-10-CM | POA: Insufficient documentation

## 2018-11-17 DIAGNOSIS — R220 Localized swelling, mass and lump, head: Secondary | ICD-10-CM | POA: Diagnosis not present

## 2018-11-17 DIAGNOSIS — J449 Chronic obstructive pulmonary disease, unspecified: Secondary | ICD-10-CM | POA: Diagnosis not present

## 2018-11-17 DIAGNOSIS — J45909 Unspecified asthma, uncomplicated: Secondary | ICD-10-CM | POA: Insufficient documentation

## 2018-11-17 NOTE — ED Triage Notes (Signed)
Pt arrives POV for eval of blt facial swelling. Pt reports hx of L sided facial swelling w/ dental abcess and multiple dental procedures since April. Pt reports that she is noting worsening swelling to blt sides of face, despite just completing a 10 day course of clinda. Pt is insistent that she needs a repeat MRI because "something isnt right". Pt w/ pressured speech in triage. No airway swelling, oropharynx intact.

## 2018-11-18 MED ORDER — CLINDAMYCIN HCL 150 MG PO CAPS: 450.0000 mg | ORAL_CAPSULE | Freq: Three times a day (TID) | ORAL | 0 refills | Status: AC

## 2018-11-18 NOTE — ED Notes (Signed)
EDP at bedside  

## 2018-11-18 NOTE — ED Notes (Signed)
Discharge instructions discussed with pt. Pt verbalized understanding. No questions at this time. Pt to follow up for MRI.
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
## 2018-11-22 ENCOUNTER — Emergency Department (HOSPITAL_COMMUNITY)
Admission: EM | Admit: 2018-11-22 | Discharge: 2018-11-22 | Disposition: A | Discharge status: Home / Self Care | Payer: Medicare Other | Attending: Emergency Medicine | Admitting: Emergency Medicine

## 2018-11-22 ENCOUNTER — Other Ambulatory Visit: Payer: Self-pay

## 2018-11-22 ENCOUNTER — Encounter (HOSPITAL_COMMUNITY): Payer: Self-pay | Admitting: Emergency Medicine

## 2018-11-22 DIAGNOSIS — J45909 Unspecified asthma, uncomplicated: Secondary | ICD-10-CM | POA: Diagnosis not present

## 2018-11-22 DIAGNOSIS — F17210 Nicotine dependence, cigarettes, uncomplicated: Secondary | ICD-10-CM | POA: Diagnosis not present

## 2018-11-22 DIAGNOSIS — Z79899 Other long term (current) drug therapy: Secondary | ICD-10-CM | POA: Diagnosis not present

## 2018-11-22 DIAGNOSIS — F3163 Bipolar disorder, current episode mixed, severe, without psychotic features: Secondary | ICD-10-CM | POA: Diagnosis not present

## 2018-11-22 DIAGNOSIS — R220 Localized swelling, mass and lump, head: Secondary | ICD-10-CM

## 2018-11-22 DIAGNOSIS — F4312 Post-traumatic stress disorder, chronic: Secondary | ICD-10-CM | POA: Diagnosis not present

## 2018-11-22 NOTE — ED Triage Notes (Signed)
Pt here in ED today due to bilateral facial swelling for over 1 month. Pt was also seen here on 8/13 for same picture of pt's face in chart appears unchanged. Pt here today due to feeling like she needs an mri to make sure its not an infection. MD spoke with this patient on 8/13 and did not feel mri would warranted.

## 2018-11-22 NOTE — ED Notes (Signed)
Patient Alert and oriented to baseline. Stable and ambulatory to baseline. Patient verbalized understanding of the discharge instructions.  Patient belongings were taken by the patient.   

## 2018-11-22 NOTE — Discharge Instructions (Addendum)
Unfortunately cause of your symptoms is unclear  I do not think you are having an anaphylaxis reaction  Please follow up with primary care doctor appointment in 2 days for more guidance  Return to ER for fever greater than 100, facial lip tongue or throat swelling, difficulty swallowing or breathing, rash, chest pain

## 2018-11-22 NOTE — ED Provider Notes (Signed)
[facility] DEPARTMENT Provider Note   CSN: [id number] Arrival date & time: 11/22/18  1650    History   Chief Complaint Chief Complaint  Patient presents with   Facial Swelling    HPI Catherine Olsen is a 48 y.o. female presents to ER for evaluation of "generalized facial swelling" onset 1 month ago that worsened this morning prompting ER visit.  Reports swelling in bilateral cheek bones, nose, forehead, neck, chest onset this morning initially moderate and now completely resolved after waiting in the ER.  She is worried that there is a generalized infection in her face from her tooth or she had an allergic reaction that cause her facial swelling.  Reports a earlier this year she had facial swelling on left upper gumline. States infection was only seen on an MRI and not on CT scans.  She then had extensive maxillofacial work up and intervention including dental graft, surgery, dental extractions. Afterwards had improvement in left sided facial swelling for 2-3 months however in the last month this swelling returned and expanded to the right side.  She saw endodontist (Dr Rita Ohara) last Thursday who told her her body was "rejecting" the graft in the left upper side and recommended dental extraction. She went to [facility]acial clinic and had tooth #14 removed on Friday (3 days ago).  She reports she had a check up yesterday (Sunday) and surgeon told her the extraction site was healing well.   She states the extraction site is painless without drainage or bleeding. She is very worried that her body is rejecting her graft. She states she has anaphylaxis to penicillins and is worried she had an allergic reaction this morning to clindamycin which she has been taking for a few days. She was seen in the ER last week for facial swelling and started on clindamycin.  At that time she wanted an MRI of her face to make sure the infection wasn't back and EDP did not think emergent  Mri was indicated.  By mistake has been taking clindamycin once daily instead of TID.  Has been on several antibiotics in the last month including azithromycin, doxycycline and clindamycin without improvement.  She [facility] an appointment with [facility] in 2 days.   She denies fevers, trismus, facial itching, throat swelling, difficulty breathing, rash.     HPI  Past Medical History:  Diagnosis Date   Abnormal uterine bleeding (AUB) 06/25/2009   Qualifier: Diagnosis of  By: Cathren Laine MD, Ankit     Allergy    Anxiety    Arthritis    hands, lower back, knees   Asthma    Bipolar 1 disorder (HCC)    COPD (chronic obstructive pulmonary disease) ([place])    Depression    Endometriosis    Fibromyalgia    Headache(784.0)    otc med prn   Migraine    Pituitary tumor    microadenoma   PONV (postoperative nausea and vomiting)    PTSD (post-traumatic stress disorder)    Scoliosis    Termination of pregnancy    x 2 at age 33 and 48 yrs old   Tobacco abuse 03/04/2015    Patient Active Problem List   Diagnosis Date Noted   Bipolar 1 disorder, manic, moderate ([place]) 09/08/2018   Bipolar affective disorder, manic, severe ([place]) 09/08/2018   Cocaine abuse ([place]) 09/08/2018   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   Chronic prescription benzodiazepine use 09/08/2018   Suicidal ideation 09/08/2018   Bipolar  1 disorder with moderate mania (HCC) 09/07/2018   Other allergic rhinitis 02/02/2018   Allergy, unspecified, subsequent encounter 02/02/2018   Mild intermittent asthma without complication 29/79/8921   Angio-edema 02/02/2018   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   Gastroesophageal reflux disease without esophagitis 02/02/2018   Localized swelling, mass, and lump of head 01/20/2018   Chronic dental pain 01/20/2018   Facial swelling 01/20/2018   Chronic facial pain 01/20/2018   Trigeminal neuralgia pain 01/02/2018   Prolonged Q-T interval on ECG 01/02/2018    Environmental allergies 10/29/2017   Pain in joint of left shoulder 07/15/2017   Neck pain 07/15/2017   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   Chronic migraine without aura without status migrainosus, not intractable 03/24/2017   Chronic midline low back pain without sciatica 03/24/2017   Pulmonary emphysema ([place]) 02/19/2017   Jaw pain 02/11/2017   Smoking history 12/09/2016   Exertional dyspnea 12/09/2016   Family history of alpha 1 antitrypsin deficiency 09/04/2016   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   Nodule of left lung 09/04/2016   Cervical lymphadenopathy 01/29/2016   Perimenopausal vasomotor symptoms 05/25/2015   Asthma with acute exacerbation 04/18/2015   Chronic rhinitis 04/18/2015   Pituitary microadenoma ([place]) 05/19/2014   Pituitary abnormality ([place]) 05/19/2014   Endometriosis of ovary 04/03/2014   Ovarian cyst, right 04/03/2014   Bipolar 1 disorder ([place]) 09/13/2012   Posttraumatic stress disorder 09/13/2012   Fibrocystic breast disease 08/24/2012   Myalgia and myositis 07/26/2012   Depression 07/26/2012   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   SEXUAL ABUSE, HX OF 01/09/2009   INSOMNIA 01/17/2008   NEVI, MULTIPLE 07/17/2006   KERATOSIS, SEBORRHEIC NEC 07/17/2006   ACNE NEC 07/17/2006    Past Surgical History:  Procedure Laterality Date   BREAST BIOPSY Right 05/19/2007   BREAST BIOPSY Right 06/02/2007   BREAST SURGERY     DILATION AND CURETTAGE OF UTERUS     FACIAL COSMETIC SURGERY     right cheek   LAPAROSCOPY Right 11/11/2013   Procedure: LAPAROSCOPY OPERATIVE with  Drainage of  RIGHT Ovarian ENDOMETRIOMA;  Surgeon: Elveria Royals, MD;  Location: [facility];  Service: Gynecology;  Laterality: Right;   NOSE SURGERY     rhinoplasty at age 70 yrs   WISDOM TOOTH EXTRACTION       OB History    Gravida  2   Para      Term      Preterm      AB  2   Living  0     SAB      TAB  2    Ectopic      Multiple      Live Births               Home Medications    Prior to Admission medications   Medication Sig Start Date End Date Taking? Authorizing Provider  ABILIFY 5 MG tablet Take 2.5-5 mg by mouth daily at 12 noon. 09/03/18   [provider]  albuterol (VENTOLIN HFA) 108 (90 Base) MCG/ACT inhaler Inhale 2 puffs into the lungs every 4 (four) hours as needed for wheezing or shortness of breath. 04/01/18   Kennith Gain, MD  ALPRAZolam Duanne Moron) 1 MG tablet Take 1 mg by mouth 3 (three) times daily. 09/03/18   [provider]  benzonatate (TESSALON PERLES) 100 MG capsule Take 1 capsule (100 mg total) by mouth 3 (three) times daily as needed. 10/31/18   Evelina Dun A, FNP  Calcium Citrate 200 MG  TABS Take 600 mg by mouth 2 (two) times daily.    [provider]  celecoxib (CELEBREX) 200 MG capsule Take 200 mg by mouth daily. 08/30/18   [provider]  clindamycin (CLEOCIN) 150 MG capsule Take 3 capsules (450 mg total) by mouth 3 (three) times daily for 10 days. 11/18/18 11/28/18  Ripley Fraise, MD  clonazePAM (KLONOPIN) 0.5 MG tablet Take 1 tablet (0.5 mg total) by mouth daily as needed for anxiety (sleep). 09/10/18   Clapacs, Madie Reno, MD  clonazePAM (KLONOPIN) 1 MG tablet Take 1 mg by mouth 2 (two) times daily.    [provider]  EPINEPHrine 0.3 mg/0.3 mL IJ SOAJ injection Inject 0.3 mLs into the skin once. 10/29/17   [provider]  estradiol (CLIMARA - DOSED IN MG/24 HR) 0.025 mg/24hr patch Place 0.025 mg onto the skin once a week.  03/10/17   [provider]  famotidine (PEPCID) 20 MG tablet Take 1 tablet twice a day for reflux, may help hives Patient not taking: Reported on 09/07/2018 02/02/18   Charlies Silvers, MD  fluticasone [facility]) 50 MCG/ACT nasal spray 1 spray per nostril twice a day if needed for stuffy nose 05/03/18   Horald Pollen, MD  HYDROcodone-acetaminophen (NORCO) 5-325 MG tablet  Take 1 tablet by mouth every 6 (six) hours as needed for moderate pain. 05/31/18   Horald Pollen, MD  ibuprofen (ADVIL,MOTRIN) 800 MG tablet TAKE 1 TABLET(800 MG) BY MOUTH EVERY 8 HOURS AS NEEDED Patient taking differently: every 6 (six) hours as needed for mild pain or moderate pain.  05/21/18   Horald Pollen, MD  loratadine (CLARITIN) 10 MG tablet Take 10 mg by mouth daily.     [provider]  magnesium gluconate (MAGONATE) 500 MG tablet Take 500 mg by mouth daily.    [provider]  Multiple Vitamin (MULTIVITAMIN) capsule Take 1 capsule by mouth daily.    [provider]  progesterone (PROMETRIUM) 200 MG capsule Take 200 mg by mouth daily.  03/09/17   [provider]  temazepam (RESTORIL) 15 MG capsule Take 45 mg by mouth at bedtime. 12/18/17   [provider]  vitamin C (ASCORBIC ACID) 500 MG tablet Take 1,500 mg by mouth daily.    [provider]  Vitamin D, Cholecalciferol, 50 MCG (2000 UT) CAPS Take 2,000 Units by mouth 2 (two) times daily.    [provider]    Family History Family History  Problem Relation Age of Onset   Diabetes Mother    Hypertension Mother    Stroke Mother 40       CVA   Mental illness Mother        no diagnosis; personality disorder   Hyperlipidemia Father    Hypertension Father    COPD Father    Alpha-1 antitrypsin deficiency Father    Asthma Father    Alpha-1 antitrypsin deficiency Brother    Asthma Brother    Diabetes Maternal Grandmother    Heart disease Maternal Grandmother    Hyperlipidemia Maternal Grandmother    Hypertension Maternal Grandmother    Mental illness Maternal Grandmother    Heart disease Maternal Grandfather    Hyperlipidemia Maternal Grandfather    Hypertension Maternal Grandfather    Asthma Maternal Grandfather    Heart disease Paternal Grandfather    Hyperlipidemia Paternal Grandfather    Hypertension Paternal Grandfather      Stroke Paternal Grandfather    Alzheimer's disease Paternal Grandmother  Allergic rhinitis Neg Hx    Angioedema Neg Hx    Eczema Neg Hx    Urticaria Neg Hx    Immunodeficiency Neg Hx     Social History Social History   Tobacco Use   Smoking status: Current Some Day Smoker    Packs/day: 2.00    Years: 29.00    Pack years: 58.00    Types: Cigarettes   Smokeless tobacco: Never Used  Substance Use Topics   Alcohol use: Not Currently   Drug use: Yes    Types: "Crack" cocaine    Comment: Heavy user per patient, last use Tuesday 11/08/13  LAST CRACK  2015     Allergies   Other, Prednisone, Sulfamethoxazole, Oxycodone, Abilify [aripiprazole], Nicotine, Penicillins, Percocet [oxycodone-acetaminophen], Sulfa antibiotics, and Sulfites   Review of Systems Review of Systems  HENT: Positive for dental problem.        Facial swelling  All other systems reviewed and are negative.    Physical Exam Updated Vital Signs BP (!) 127/91 (BP Location: Right Arm)    Pulse 79    Temp 97.9 F (36.6 C) (Oral)    Resp 14    LMP 11/21/2014    SpO2 99%   Physical Exam Constitutional:      Appearance: She is well-developed.  HENT:     Head: Normocephalic.     Comments: Photo on chart from last ER visit reviewed, on exam today she has no obvious facial edema, erythema compared to previous photo. Face today similar to photo from last ER visit. No maxillary/mandibular tenderness, fluctuance, edema.    Nose: Nose normal.     Comments: Nose is normal without edema, fluctuance, erythema, tenderness    Mouth/Throat:     Comments: Lips normal w/o edema or lesions. Poor dentition, several extracted teeth.  Extraction site at left upper gumline appears to be healing well without focal erythema, edema, fluctuance bleeding. No trismus. No buccal mucosa edema or tenderness or fluctuance. Remainder of gumline/gingiva without edema, erythema, fluctuance, lesions, masses. SL space normal. Normal  protrusion of tongue. Normal phonation. Tolerating secretions. Oropharynx and tonsils easily visualized without edema, erythema, exudates.  Eyes:     General: Lids are normal.  Neck:     Musculoskeletal: Normal range of motion.  Cardiovascular:     Rate and Rhythm: Normal rate.  Pulmonary:     Effort: Pulmonary effort is normal. No respiratory distress.  Musculoskeletal: Normal range of motion.  Neurological:     Mental Status: She is alert.  Psychiatric:        Behavior: Behavior normal.     Comments: Pressured speech      ED Treatments / Results  Labs (all labs ordered are listed, but only abnormal results are displayed) Labs Reviewed - No data to display  EKG None  Radiology No results found.  Procedures Procedures (including critical care time)  Medications Ordered in ED Medications - No data to display   Initial Impression / Assessment and Plan / ED Course  I have reviewed the triage vital signs and the nursing notes.  Pertinent labs & imaging results that were available during my care of the patient were reviewed by me and considered in my medical decision making (see chart for details).  No obvious objective facial swelling on exam today when compared to last ER visit photo.  Intraoral and throat exam unremarkable, as above. Clinically there are no signs to suggest acute or life threatening infection, dental abscess, anaphylaxis.  I have reviewed pts EMR to obtain pertinent PMH. Has been seen by PCP, UC, ER several times for facial swelling in the last several weeks.  Has been on several antibiotics.  MRI 07/2018 reviewed showed chronic infection around tooth #12, soft tissue swelling in left maxilla, no inflammatory changes are associated with the implants.   Given chronicity of symptoms, lack of fever, clinical exam I doubt acute imminent life threatening process. This could be chronic inflammatory changes from several surgeries or early infection although she  has been on clindamycin for several years. Non toxic appearing. No signs of anaphylaxis.    Discussed with pt no indication for emergent lab, imaging or interventions in ER. Encouraged her to obtain further guidance from PCP in 2 days, f/u with maxillofacial specialist who may order OP MRI. Return precautions discussed. Pt expressed frustration with chronicity of symptoms and not knowing etiology but understood and agreed with recommendations with OP f/u.  Final Clinical Impressions(s) / ED Diagnoses   Final diagnoses:  Facial swelling    ED Discharge Orders    None       Kinnie Feil, PA-C 11/23/18 1507    Sherwood Gambler, MD 11/24/18 [phone]

## 2018-11-22 NOTE — Progress Notes (Signed)
Patient ID: Catherine Olsen, female   DOB: 10-17-1970, 48 y.o.   MRN: [id number]  Virtual Visit via Telephone Note  I connected with [name] on 11/24/18 at  1:30 PM EDT by telephone and verified that I am speaking with the correct person using two identifiers.   I discussed the limitations, risks, security and privacy concerns of performing an evaluation and management service by telephone and the availability of in person appointments. I also discussed with the patient that there may be a patient responsible charge related to this service. The patient expressed understanding and agreed to proceed.  Patient location:  home My Location:  [place] office Persons on the call:  The patient and me  History of Present Illness: after being seen in the ED 11/17/2018 for facial swelling.  She is being treated for cellulitis currently on antibiotics.  This time swelling is on the R side of the face.  Previously these infections have been very hard to detect.  She is not having fever.  She has had multiple dental surgeries.  No diarrhea.  Swelling is mild.  Not worse than 2 days ago when seen at ED, but also not improving after 2 days on antibiotics.    From ED note: Patient reports increasing bilateral facial swelling for the past month.  She reports long history of dental infections that  got into the "bone" and she has required multiple dental surgeries.  She feels that this is returning.  She was recently seen by dentistry and told that it was not her teeth, but could be in her jawbone.  She has been on clindamycin 150 mg for the past 10 days.  No fever/vomiting.  No difficulty swallowing.  No chest pain or abdominal pain. There is no visual changes   Observations/Objective: A&Ox3.  Patient with pressured speech.  Very anxious about getting MRI done.     Assessment and Plan: 1. Cellulitis of face Continue antibiotics.  To ED if fever or systemic signs of illness develop.   - MR FACE/TRIGEMINAL WO/W CM;  Future   Follow Up Instructions: Assign PCP in 2-3 weeks   I discussed the assessment and treatment plan with the patient. The patient was provided an opportunity to ask questions and all were answered. The patient agreed with the plan and demonstrated an understanding of the instructions.   The patient was advised to call back or seek an in-person evaluation if the symptoms worsen or if the condition fails to improve as anticipated.  I provided 23 minutes of non-face-to-face time during this encounter.   Freeman Caldron, PA-C

## 2018-11-24 ENCOUNTER — Other Ambulatory Visit: Payer: Self-pay

## 2018-11-24 ENCOUNTER — Ambulatory Visit: Payer: Medicare Other | Attending: Family Medicine | Admitting: Physician Assistant

## 2018-11-24 DIAGNOSIS — L03211 Cellulitis of face: Secondary | ICD-10-CM | POA: Diagnosis not present

## 2018-11-24 NOTE — Progress Notes (Signed)
Patient verified DOB Patient has taken medication today. Patient has not eaten today. Patient uses liquid diet when she is unable to chew fully

## 2018-11-28 DIAGNOSIS — Z20828 Contact with and (suspected) exposure to other viral communicable diseases: Secondary | ICD-10-CM | POA: Diagnosis not present

## 2018-11-28 DIAGNOSIS — J439 Emphysema, unspecified: Secondary | ICD-10-CM | POA: Diagnosis not present

## 2018-11-28 DIAGNOSIS — F319 Bipolar disorder, unspecified: Secondary | ICD-10-CM | POA: Diagnosis not present

## 2018-11-28 DIAGNOSIS — R220 Localized swelling, mass and lump, head: Secondary | ICD-10-CM | POA: Diagnosis not present

## 2018-11-29 DIAGNOSIS — F4312 Post-traumatic stress disorder, chronic: Secondary | ICD-10-CM | POA: Diagnosis not present

## 2018-11-29 DIAGNOSIS — F3163 Bipolar disorder, current episode mixed, severe, without psychotic features: Secondary | ICD-10-CM | POA: Diagnosis not present

## 2018-11-30 ENCOUNTER — Telehealth: Payer: Self-pay | Admitting: General Practice

## 2018-11-30 NOTE — Telephone Encounter (Signed)
Advised patient of message.

## 2018-11-30 NOTE — Telephone Encounter (Signed)
Patient may call [phone] to schedule her MRI directly. She may pick the best time and day with their availability.

## 2018-11-30 NOTE — Telephone Encounter (Signed)
Patient called stating that she was told by the representative from the number given  to call back to the PCP office stating that each location needs their own MRI order and that the patient cannot call for their own order. Patient states she needs one for a [facility] location that can get her in the soonest and another for a [facility] location. Please follow up.

## 2018-11-30 NOTE — Telephone Encounter (Signed)
Patient verified DOB Patient is aware of imaging being scheduled for 9/18 at the [facility] imagining. Patient is complaining of symptoms worsening and antibiotics will be completed on tomorrow. Patient is requesting a STAT MRI be placed for [facility] in attempts to be seen sooner.

## 2018-11-30 NOTE — Telephone Encounter (Signed)
I have not seen patient. Will forward to [employer], [place] and [place], [place]

## 2018-11-30 NOTE — Telephone Encounter (Addendum)
Patient called wanting to get more information in regards to her MRI being schedule at [facility] if it will get her in for a sooner appt.  Patient also called to check on the prior approval.   Please follow up.

## 2018-12-01 ENCOUNTER — Encounter: Payer: Self-pay | Admitting: Nurse Practitioner

## 2018-12-01 ENCOUNTER — Ambulatory Visit (INDEPENDENT_AMBULATORY_CARE_PROVIDER_SITE_OTHER): Payer: Medicare Other | Admitting: Nurse Practitioner

## 2018-12-01 DIAGNOSIS — L089 Local infection of the skin and subcutaneous tissue, unspecified: Secondary | ICD-10-CM

## 2018-12-01 DIAGNOSIS — R220 Localized swelling, mass and lump, head: Secondary | ICD-10-CM | POA: Diagnosis not present

## 2018-12-01 DIAGNOSIS — L0889 Other specified local infections of the skin and subcutaneous tissue: Secondary | ICD-10-CM

## 2018-12-01 NOTE — Progress Notes (Signed)
Following up on facial swelling. Seen at [facility] virtually & needs to be assigned a PCP.  Hasn't noticed a lot improvement in facial swelling. Is finishing up her last day of Clindamycin.

## 2018-12-02 ENCOUNTER — Telehealth: Payer: Self-pay | Admitting: Nurse Practitioner

## 2018-12-02 ENCOUNTER — Encounter: Payer: Self-pay | Admitting: Nurse Practitioner

## 2018-12-02 NOTE — Progress Notes (Signed)
Virtual Visit via Telephone Note Due to national recommendations of social distancing due to [place] 19, telehealth visit is felt to be most appropriate for this patient at this time.  I discussed the limitations, risks, security and privacy concerns of performing an evaluation and management service by telephone and the availability of in person appointments. I also discussed with the patient that there may be a patient responsible charge related to this service. The patient expressed understanding and agreed to proceed.    I connected with [name] on 12/01/18  at   9:30 AM EDT  EDT by telephone and verified that I am speaking with the correct person using two identifiers.   Consent I discussed the limitations, risks, security and privacy concerns of performing an evaluation and management service by telephone and the availability of in person appointments. I also discussed with the patient that there may be a patient responsible charge related to this service. The patient expressed understanding and agreed to proceed.   Location of Patient: Private Residence    Location of Provider: [facility] participating in Telemedicine visit: Geryl Rankins FNP-BC [place]    History of Present Illness: Telemedicine visit for: Facial swelling   has a past medical history of Abnormal uterine bleeding (AUB) (06/25/2009), Allergy, Anxiety, Arthritis, Asthma, Bipolar 1 disorder ([place]), COPD (chronic obstructive pulmonary disease) ([place]), Depression, Endometriosis, Fibromyalgia, Headache(784.0), Migraine, Pituitary tumor, PONV (postoperative nausea and vomiting), PTSD (post-traumatic stress disorder), Scoliosis, Termination of pregnancy, and Tobacco abuse (03/04/2015).   She made appointment to establish care however as soon as she is tranferred to me on the phone she starts yelling that she needs an MRI and she needs it now. It appears she has been  going back and forth establishing care here today only to have an MRI ordered as she has an appointment with another PCP on 12-07-2018 whom she has seen recently at [facility].   She has a history of recurrent left sided facial swelling since 2018. She has been on multiple doses of prednisone, clindamycina  Per chart review With chart review, she had previous plastic surgery involving facial gortex implants due to depression of maxillary regions bilaterally in 1995. She has been to that plastic surgeon in the last 2 years who felt that her symptoms were not associated with the implants with recent facial MR 4/20 supporting no evidence of implant etiology. She has been to many different specialists regarding this recurrent left facial swelling and sometimes notes some mild right facial swelling. Specialists include; several ENTs, neurologist, endodontist, dentist, oromaxillofacial surgeons in [place], where she lives with no specific confirmation of etiology outside of periodontal disease. She has had several imaging studies performed with MR facial 02/19/23 noting need for root canal of left first maxillary molar; performed by endodontist. She has been placed of many regimens of antibiotics with possible sinus infection verses periodontal infection reasons. Is allergic to pen so has taken clindamycin, levaquin, azithromycin. Patient was evaluated in this clinic for same problem Several visits 9/19 for facial infection and treated with Keflex, notes reviewed. Facial MR 1/20 reported residual marrow edema/inflammation from the left second bicuspid through the second molar to include superficial and deep buccal cortex at the past root canal procedure site  She presented to [facility] Emergency Department 05/09/18 for left sided facial pain and swelling, but no objective findings of facial swelling were noted at that time and so antibiotics were not  prescribed, Per the patient, she underwent apicoectomy, bone biopsy, and local  bone grafting by the same endodontist on 2/10, with documents unavailable.   Today she tells me that "her radiologist friend" told her every time her face is swollen she needs to have an MRI obtained. She is upset that she has an MRI scheduled for 12-24-2018 and she believes she needs a stat MRI.  I have instructed her that I did not feel a stat MRI was warranted and there is a chance the insurance company may not pay for the MRI and she states " well they have been paying for it!" She was seen in [facility] ED on 11-28-2018 with complaints of BILATERAL facial swelling. Based on exam at that time there was low suspicion for acute infection and imaging was not performed at that time. CBC was negative.  I offered her a stat ID referral as this seems to be a recurrent infection. She states I have already seen a dentist, and endodontist who said this is not related to my teeth (although previous notes states she was told by an endodontist that her bone graft in her jaw was rejected), infectious disease and ENT and "I need to have my  MRI so I will know who to go to."  Last MRI: 07-19-2018 1. Persistent indolent soft tissue enhancement lateral to the lateral root of tooth #12, consistent with chronic infection. Intermittent symptoms likely reflect some response to the antibiotics. No definite dental disease was identified in this tooth on the previous CT scan. The root is likely exposed. 2. Mucosal soft tissue swelling along the lateral buccal surface of the left alveolar ridge of the maxilla. 3. Decreased marrow signal abnormality within the left maxilla, likely reactive. 4. No discrete abscess or mass lesion. 5. No soft tissue mass lesion or evidence for malignancy. 6. No inflammatory changes are associated with the implants anterior to the maxilla. I instructed her that this seems to be a dental issue and she really needs to follow up with her dentist for more indepth imaging and then referral to an oral  surgeon. She states she has already has imaging performed and they told her it was not related to her dentition.     Past Medical History:  Diagnosis Date  . Abnormal uterine bleeding (AUB) 06/25/2009   Qualifier: Diagnosis of  By: Cathren Laine MD, Ankit    . Allergy   . Anxiety   . Arthritis    hands, lower back, knees  . Asthma   . Bipolar 1 disorder ([place])   . COPD (chronic obstructive pulmonary disease) ([place])   . Depression   . Endometriosis   . Fibromyalgia   . Headache(784.0)    otc med prn  . Migraine   . Pituitary tumor    microadenoma  . PONV (postoperative nausea and vomiting)   . PTSD (post-traumatic stress disorder)   . Scoliosis   . Termination of pregnancy    x 2 at age 22 and 48 yrs old  . Tobacco abuse 03/04/2015    Past Surgical History:  Procedure Laterality Date  . BREAST BIOPSY Right 05/19/2007  . BREAST BIOPSY Right 06/02/2007  . BREAST SURGERY    . DILATION AND CURETTAGE OF UTERUS    . FACIAL COSMETIC SURGERY     right cheek  . LAPAROSCOPY Right 11/11/2013   Procedure: LAPAROSCOPY OPERATIVE with  Drainage of  RIGHT Ovarian ENDOMETRIOMA;  Surgeon: Elveria Royals, MD;  Location: [facility];  Service: Gynecology;  Laterality: Right;  . NOSE SURGERY     rhinoplasty at age 73 yrs  . WISDOM TOOTH EXTRACTION      Family History  Problem Relation Age of Onset  . Diabetes Mother   . Hypertension Mother   . Stroke Mother 18       CVA  . Mental illness Mother        no diagnosis; personality disorder  . Hyperlipidemia Father   . Hypertension Father   . COPD Father   . Alpha-1 antitrypsin deficiency Father   . Asthma Father   . Alpha-1 antitrypsin deficiency Brother   . Asthma Brother   . Diabetes Maternal Grandmother   . Heart disease Maternal Grandmother   . Hyperlipidemia Maternal Grandmother   . Hypertension Maternal Grandmother   . Mental illness Maternal Grandmother   . Heart disease Maternal Grandfather   . Hyperlipidemia Maternal Grandfather   .  Hypertension Maternal Grandfather   . Asthma Maternal Grandfather   . Heart disease Paternal Grandfather   . Hyperlipidemia Paternal Grandfather   . Hypertension Paternal Grandfather   . Stroke Paternal Grandfather   . Alzheimer's disease Paternal Grandmother   . Allergic rhinitis Neg Hx   . Angioedema Neg Hx   . Eczema Neg Hx   . Urticaria Neg Hx   . Immunodeficiency Neg Hx     Social History   Socioeconomic History  . Marital status: Divorced    Spouse name: Not on file  . Number of children: 0  . Years of education: Not on file  . Highest education level: Bachelor's degree (e.g., BA, AB, BS)  Occupational History  . Occupation: disability    Comment: mental illness  Social Needs  . Financial resource strain: Not on file  . Food insecurity    Worry: Not on file    Inability: Not on file  . Transportation needs    Medical: Not on file    Non-medical: Not on file  Tobacco Use  . Smoking status: Current Some Day Smoker    Packs/day: 2.00    Years: 29.00    Pack years: 58.00    Types: Cigarettes  . Smokeless tobacco: Never Used  Substance and Sexual Activity  . Alcohol use: Not Currently  . Drug use: Yes    Types: "Crack" cocaine    Comment: Heavy user per patient, last use Tuesday 11/08/13  LAST CRACK  2015  . Sexual activity: Not Currently    Comment: abortion at 56 and 66yrs. of age   Lifestyle  . Physical activity    Days per week: Not on file    Minutes per session: Not on file  . Stress: Not on file  Relationships  . Social Herbalist on phone: Not on file    Gets together: Not on file    Attends religious service: Not on file    Active member of club or organization: Not on file    Attends meetings of clubs or organizations: Not on file    Relationship status: Not on file  Other Topics Concern  . Not on file  Social History Narrative   Marital status: divorced; not dating      Children: none      Lives: alone with mom      Employment:  disability for mental illness in 1997.  Hospitalizations x 9 in past.      Tobacco: 1 ppd x since age 57. Decreasing in 2018.  Alcohol: socially; beers.      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      Exercise: yoga daily; exercise daily.      ADLs: independent with ADLs; no car; depends on others for transportation.      Patient is right-handed. She is divorced and lives with her mother. She drinks 2-3 cups of 1/2 caffeine coffe a day. She walks occasionally for exercise.     Observations/Objective: Awake, alert and oriented x 3   Review of Systems  Constitutional: Negative for chills, fever, malaise/fatigue and weight loss.  HENT: Negative for congestion, ear discharge, ear pain, hearing loss, nosebleeds, sinus pain, sore throat and tinnitus.        Left sided facial swelling  Eyes: Negative.  Negative for blurred vision, double vision and photophobia.  Respiratory: Negative.  Negative for cough, shortness of breath and stridor.   Cardiovascular: Negative.  Negative for chest pain, palpitations and leg swelling.  Gastrointestinal: Negative.  Negative for heartburn, nausea and vomiting.  Musculoskeletal: Negative.  Negative for myalgias.  Skin: Negative for rash.  Neurological: Negative.  Negative for dizziness, focal weakness, seizures and headaches.  Psychiatric/Behavioral: Negative for suicidal ideas.       Tangential, argumentative    Assessment and Plan:  Diagnoses and all orders for this visit:  Facial infection -     Ambulatory referral to Infectious Disease  Localized swelling, mass, and lump of head -     MR FACE/TRIGEMINAL WO/W CM; Future     Follow Up Instructions No follow-ups on file.     I discussed the assessment and treatment plan with the patient. The patient was provided an opportunity to ask questions and all were answered. The patient agreed with the plan and demonstrated an understanding of the instructions.   The patient  was advised to call back or seek an in-person evaluation if the symptoms worsen or if the condition fails to improve as anticipated.  I provided 23 minutes of non-face-to-face time during this encounter including median intraservice time, reviewing previous notes, labs, imaging, medications and explaining diagnosis and management.  Gildardo Pounds, FNP-BC

## 2018-12-03 ENCOUNTER — Telehealth: Payer: Self-pay | Admitting: Internal Medicine

## 2018-12-03 NOTE — Telephone Encounter (Signed)
Spoke with the pt  She was last seen in 2018 and was rec to have a ct f/u nodule and refused  She is now scheduled for ov with MR for 12/08/18 b/c she says she has swelling and pain in her rib cage  She is wanting to know if MR wants to see her first or go ahead and order the ct chest now and push out her appt until after the ct can be done  Please advise, thanks

## 2018-12-04 DIAGNOSIS — R220 Localized swelling, mass and lump, head: Secondary | ICD-10-CM | POA: Diagnosis not present

## 2018-12-06 DIAGNOSIS — Z780 Asymptomatic menopausal state: Secondary | ICD-10-CM | POA: Diagnosis not present

## 2018-12-06 DIAGNOSIS — Z720 Tobacco use: Secondary | ICD-10-CM | POA: Diagnosis not present

## 2018-12-06 DIAGNOSIS — F4312 Post-traumatic stress disorder, chronic: Secondary | ICD-10-CM | POA: Diagnosis not present

## 2018-12-06 DIAGNOSIS — R51 Headache: Secondary | ICD-10-CM | POA: Diagnosis not present

## 2018-12-06 DIAGNOSIS — Z98818 Other dental procedure status: Secondary | ICD-10-CM | POA: Diagnosis not present

## 2018-12-06 DIAGNOSIS — M797 Fibromyalgia: Secondary | ICD-10-CM | POA: Diagnosis not present

## 2018-12-06 DIAGNOSIS — F319 Bipolar disorder, unspecified: Secondary | ICD-10-CM | POA: Diagnosis not present

## 2018-12-06 DIAGNOSIS — Z79899 Other long term (current) drug therapy: Secondary | ICD-10-CM | POA: Diagnosis not present

## 2018-12-06 DIAGNOSIS — M272 Inflammatory conditions of jaws: Secondary | ICD-10-CM | POA: Diagnosis not present

## 2018-12-06 DIAGNOSIS — F3163 Bipolar disorder, current episode mixed, severe, without psychotic features: Secondary | ICD-10-CM | POA: Diagnosis not present

## 2018-12-06 DIAGNOSIS — F419 Anxiety disorder, unspecified: Secondary | ICD-10-CM | POA: Diagnosis not present

## 2018-12-06 NOTE — Telephone Encounter (Signed)
Do HRCT first - indication: smoking ( reports that she has been smoking cigarettes. She has a 58.00 pack-year smoking history. She has never used smokeless tobacco. ) and RB-ILD   Appt after CT

## 2018-12-06 NOTE — Telephone Encounter (Signed)
Left message for patient to call back  

## 2018-12-06 NOTE — Telephone Encounter (Signed)
Pending response from Dr. Chase Caller.

## 2018-12-07 ENCOUNTER — Inpatient Hospital Stay: Payer: Medicare Other | Admitting: Family Medicine

## 2018-12-07 DIAGNOSIS — F319 Bipolar disorder, unspecified: Secondary | ICD-10-CM | POA: Diagnosis not present

## 2018-12-07 DIAGNOSIS — M797 Fibromyalgia: Secondary | ICD-10-CM | POA: Diagnosis not present

## 2018-12-07 DIAGNOSIS — F419 Anxiety disorder, unspecified: Secondary | ICD-10-CM | POA: Diagnosis not present

## 2018-12-07 DIAGNOSIS — M279 Disease of jaws, unspecified: Secondary | ICD-10-CM | POA: Diagnosis not present

## 2018-12-07 NOTE — Telephone Encounter (Signed)
Called ands poke to pt. Pt states she is currently hospitalized at [facility]. Pt states she will call us once she is discharged. Pt quick to get off the phone, unable to get why she was hospitalized.   Will forward to MR as FYI.

## 2018-12-07 NOTE — Telephone Encounter (Signed)
Ok thanks 

## 2018-12-08 ENCOUNTER — Ambulatory Visit: Payer: Medicare Other | Admitting: Internal Medicine

## 2018-12-09 DIAGNOSIS — J439 Emphysema, unspecified: Secondary | ICD-10-CM | POA: Diagnosis not present

## 2018-12-09 DIAGNOSIS — F319 Bipolar disorder, unspecified: Secondary | ICD-10-CM | POA: Diagnosis not present

## 2018-12-09 DIAGNOSIS — Z885 Allergy status to narcotic agent status: Secondary | ICD-10-CM | POA: Diagnosis not present

## 2018-12-09 DIAGNOSIS — Z880 Allergy status to penicillin: Secondary | ICD-10-CM | POA: Diagnosis not present

## 2018-12-09 DIAGNOSIS — Z882 Allergy status to sulfonamides status: Secondary | ICD-10-CM | POA: Diagnosis not present

## 2018-12-09 DIAGNOSIS — R220 Localized swelling, mass and lump, head: Secondary | ICD-10-CM | POA: Diagnosis not present

## 2018-12-09 DIAGNOSIS — R51 Headache: Secondary | ICD-10-CM | POA: Diagnosis not present

## 2018-12-10 ENCOUNTER — Telehealth: Payer: Self-pay | Admitting: Family Medicine

## 2018-12-10 ENCOUNTER — Other Ambulatory Visit: Payer: Self-pay | Admitting: Family Medicine

## 2018-12-10 MED ORDER — CLINDAMYCIN HCL 300 MG PO CAPS: 300.0000 mg | ORAL_CAPSULE | Freq: Three times a day (TID) | ORAL | 0 refills | Status: DC

## 2018-12-10 NOTE — Telephone Encounter (Signed)
Patient has received MRI results from [facility], states bone infection in face, please contact patient with results.

## 2018-12-10 NOTE — Telephone Encounter (Signed)
I spoke with the patient on the phone who wondered why no one had contacted her about MRI results she had at [facility]. 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  I explained MRI (read 12/06/18) was from outside facility and would not come to the Clinicians inbox however I would investigate as to why her mychart message from 8/29 was not responded to. She re- presented to [facility] ED yesterday and referred to oral surgeon. She is wondering if something dire needs to be done and if the MRI reading is wrong and that she might need a STAT MRI in the event that something has changed in her jaw.States she might need IV antibiotics which she would like [place] to order or a bone biopsy. I have explained she does not need another MRI,  IV antibiotics need to be administered in the ED (she was there yesterday but was not prescribed), I will write her oral antibiotics and if symptoms worsen she needs to present to the ED but needs to ensure she follows up with Oral Surgeon.

## 2018-12-10 NOTE — Telephone Encounter (Signed)
Patient is upset that no one called to inform about MRI results. She had to call [facility] to get the results herself and to find that she has a bone infection. She states she does not know what she should do next. She feels that the ball has been dropped in several health systems that she has went to: [facility], [place].  Patient went to the ED last night at [facility] because she does not know what to do.   Informed patient that results from her MRI had not been received, nor was there a call report from [place] to our office to relay to Catherine Olsen regarding her MRI.    Verified patient phone number was correct that was listed. Will follow up with patient.   Information was relayed to Dr. Margarita Rana to f/u with patient regarding her MRI results.

## 2018-12-13 NOTE — Telephone Encounter (Signed)
Thank you. I do not see a mychart message from 12-04-2018. My last call to the patient was regarding the MRI being ordered. I had no idea when she went to have it performed as she did not let me know when she was going. Thank you for contacting the patient Dr. Margarita Rana.

## 2018-12-14 DIAGNOSIS — M272 Inflammatory conditions of jaws: Secondary | ICD-10-CM | POA: Diagnosis not present

## 2018-12-14 DIAGNOSIS — R220 Localized swelling, mass and lump, head: Secondary | ICD-10-CM | POA: Diagnosis not present

## 2018-12-14 DIAGNOSIS — M8660 Other chronic osteomyelitis, unspecified site: Secondary | ICD-10-CM | POA: Diagnosis not present

## 2018-12-16 DIAGNOSIS — F3111 Bipolar disorder, current episode manic without psychotic features, mild: Secondary | ICD-10-CM | POA: Diagnosis not present

## 2018-12-16 DIAGNOSIS — F4312 Post-traumatic stress disorder, chronic: Secondary | ICD-10-CM | POA: Diagnosis not present

## 2018-12-16 DIAGNOSIS — R220 Localized swelling, mass and lump, head: Secondary | ICD-10-CM | POA: Diagnosis not present

## 2018-12-17 DIAGNOSIS — R220 Localized swelling, mass and lump, head: Secondary | ICD-10-CM | POA: Diagnosis not present

## 2018-12-20 DIAGNOSIS — F3111 Bipolar disorder, current episode manic without psychotic features, mild: Secondary | ICD-10-CM | POA: Diagnosis not present

## 2018-12-20 DIAGNOSIS — F4312 Post-traumatic stress disorder, chronic: Secondary | ICD-10-CM | POA: Diagnosis not present

## 2018-12-23 ENCOUNTER — Ambulatory Visit: Payer: Medicare Other | Attending: Family Medicine | Admitting: Family Medicine

## 2018-12-23 ENCOUNTER — Encounter: Payer: Self-pay | Admitting: Family Medicine

## 2018-12-23 ENCOUNTER — Other Ambulatory Visit: Payer: Self-pay

## 2018-12-23 VITALS — BP 108/75 | HR 76 | Temp 97.6°F | Ht 68.0 in | Wt 141.0 lb

## 2018-12-23 DIAGNOSIS — J449 Chronic obstructive pulmonary disease, unspecified: Secondary | ICD-10-CM | POA: Diagnosis not present

## 2018-12-23 DIAGNOSIS — H6593 Unspecified nonsuppurative otitis media, bilateral: Secondary | ICD-10-CM

## 2018-12-23 DIAGNOSIS — Z113 Encounter for screening for infections with a predominantly sexual mode of transmission: Secondary | ICD-10-CM | POA: Insufficient documentation

## 2018-12-23 DIAGNOSIS — F419 Anxiety disorder, unspecified: Secondary | ICD-10-CM | POA: Diagnosis not present

## 2018-12-23 DIAGNOSIS — M25571 Pain in right ankle and joints of right foot: Secondary | ICD-10-CM | POA: Diagnosis not present

## 2018-12-23 DIAGNOSIS — M419 Scoliosis, unspecified: Secondary | ICD-10-CM | POA: Diagnosis not present

## 2018-12-23 DIAGNOSIS — Z7721 Contact with and (suspected) exposure to potentially hazardous body fluids: Secondary | ICD-10-CM | POA: Diagnosis not present

## 2018-12-23 DIAGNOSIS — R51 Headache: Secondary | ICD-10-CM | POA: Insufficient documentation

## 2018-12-23 DIAGNOSIS — M1990 Unspecified osteoarthritis, unspecified site: Secondary | ICD-10-CM | POA: Diagnosis not present

## 2018-12-23 DIAGNOSIS — M797 Fibromyalgia: Secondary | ICD-10-CM | POA: Diagnosis not present

## 2018-12-23 DIAGNOSIS — R05 Cough: Secondary | ICD-10-CM

## 2018-12-23 DIAGNOSIS — M25572 Pain in left ankle and joints of left foot: Secondary | ICD-10-CM | POA: Insufficient documentation

## 2018-12-23 DIAGNOSIS — Z79899 Other long term (current) drug therapy: Secondary | ICD-10-CM

## 2018-12-23 DIAGNOSIS — Z888 Allergy status to other drugs, medicaments and biological substances status: Secondary | ICD-10-CM | POA: Diagnosis not present

## 2018-12-23 DIAGNOSIS — Z880 Allergy status to penicillin: Secondary | ICD-10-CM | POA: Diagnosis not present

## 2018-12-23 DIAGNOSIS — R0981 Nasal congestion: Secondary | ICD-10-CM | POA: Insufficient documentation

## 2018-12-23 DIAGNOSIS — R220 Localized swelling, mass and lump, head: Secondary | ICD-10-CM | POA: Diagnosis present

## 2018-12-23 DIAGNOSIS — Z881 Allergy status to other antibiotic agents status: Secondary | ICD-10-CM | POA: Insufficient documentation

## 2018-12-23 DIAGNOSIS — R059 Cough, unspecified: Secondary | ICD-10-CM

## 2018-12-23 DIAGNOSIS — F17210 Nicotine dependence, cigarettes, uncomplicated: Secondary | ICD-10-CM | POA: Insufficient documentation

## 2018-12-23 MED ORDER — OFLOXACIN 0.3 % OT SOLN: 5.0000 [drp] | Freq: Every day | OTIC | 0 refills | Status: AC
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
## 2018-12-23 NOTE — Progress Notes (Signed)
Patient is here to establish care.  Pt. Is here for swelling of her face.She stated she finished the antibiotic and the swelling is back.

## 2018-12-24 ENCOUNTER — Other Ambulatory Visit: Payer: Medicare Other

## 2018-12-24 ENCOUNTER — Ambulatory Visit: Payer: Medicare Other | Attending: Family Medicine

## 2018-12-24 DIAGNOSIS — Z7721 Contact with and (suspected) exposure to potentially hazardous body fluids: Secondary | ICD-10-CM | POA: Diagnosis not present

## 2018-12-24 DIAGNOSIS — R059 Cough, unspecified: Secondary | ICD-10-CM

## 2018-12-24 DIAGNOSIS — R05 Cough: Secondary | ICD-10-CM | POA: Diagnosis not present

## 2018-12-24 DIAGNOSIS — Z79899 Other long term (current) drug therapy: Secondary | ICD-10-CM

## 2018-12-25 ENCOUNTER — Encounter: Payer: Self-pay | Admitting: Family Medicine

## 2018-12-27 ENCOUNTER — Other Ambulatory Visit: Payer: Self-pay | Admitting: Family Medicine

## 2018-12-27 DIAGNOSIS — F4312 Post-traumatic stress disorder, chronic: Secondary | ICD-10-CM | POA: Diagnosis not present

## 2018-12-27 DIAGNOSIS — E237 Disorder of pituitary gland, unspecified: Secondary | ICD-10-CM

## 2018-12-27 DIAGNOSIS — N6011 Diffuse cystic mastopathy of right breast: Secondary | ICD-10-CM | POA: Diagnosis not present

## 2018-12-27 DIAGNOSIS — N6012 Diffuse cystic mastopathy of left breast: Secondary | ICD-10-CM | POA: Diagnosis not present

## 2018-12-27 DIAGNOSIS — F3111 Bipolar disorder, current episode manic without psychotic features, mild: Secondary | ICD-10-CM | POA: Diagnosis not present

## 2018-12-27 NOTE — Progress Notes (Signed)
Patient ID: Catherine Olsen, female   DOB: 1970/04/21, 48 y.o.   MRN: [id number]   Patient left I chart message requesting testing of her human growth factor hormone due to history of a pituitary disorder.  Message was sent to the patient that a endocrinology referral will be placed for her and she can contact the office regarding her preference for particular endocrinologist a group

## 2018-12-28 ENCOUNTER — Encounter (HOSPITAL_COMMUNITY): Payer: Self-pay | Admitting: *Deleted

## 2018-12-28 ENCOUNTER — Emergency Department (HOSPITAL_COMMUNITY)
Admission: EM | Admit: 2018-12-28 | Discharge: 2018-12-28 | Disposition: A | Discharge status: Home / Self Care | Payer: Medicare Other | Attending: Emergency Medicine | Admitting: Emergency Medicine

## 2018-12-28 DIAGNOSIS — R51 Headache: Secondary | ICD-10-CM | POA: Insufficient documentation

## 2018-12-28 DIAGNOSIS — R200 Anesthesia of skin: Secondary | ICD-10-CM | POA: Diagnosis not present

## 2018-12-28 DIAGNOSIS — Z969 Presence of functional implant, unspecified: Secondary | ICD-10-CM | POA: Insufficient documentation

## 2018-12-28 DIAGNOSIS — R519 Headache, unspecified: Secondary | ICD-10-CM

## 2018-12-28 DIAGNOSIS — F17210 Nicotine dependence, cigarettes, uncomplicated: Secondary | ICD-10-CM | POA: Diagnosis not present

## 2018-12-28 DIAGNOSIS — J45909 Unspecified asthma, uncomplicated: Secondary | ICD-10-CM | POA: Insufficient documentation

## 2018-12-28 LAB — HEPB+HEPC+HIV PANEL
HIV Screen 4th Generation wRfx: NONREACTIVE
Hep B C IgM: NEGATIVE
Hep B Core Total Ab: NEGATIVE
Hep B E Ab: NEGATIVE
Hep B E Ag: NEGATIVE
Hep B Surface Ab, Qual: NONREACTIVE
Hep C Virus Ab: <0.1 {s_co_ratio} (ref 0.0–0.9)
Hepatitis B Surface Ag: NEGATIVE

## 2018-12-28 LAB — COMPREHENSIVE METABOLIC PANEL WITH GFR
ALT: 8 IU/L (ref 0–32)
AST: 11 IU/L (ref 0–40)
Albumin/Globulin Ratio: 2.4 — ABNORMAL HIGH (ref 1.2–2.2)
Albumin: 4.5 g/dL (ref 3.8–4.8)
Alkaline Phosphatase: 59 IU/L (ref 39–117)
BUN/Creatinine Ratio: 30 — ABNORMAL HIGH (ref 9–23)
BUN: 20 mg/dL (ref 6–24)
Bilirubin Total: 0.3 mg/dL (ref 0.0–1.2)
CO2: 22 mmol/L (ref 20–29)
Calcium: 9.5 mg/dL (ref 8.7–10.2)
Chloride: 106 mmol/L (ref 96–106)
Creatinine, Ser: 0.67 mg/dL (ref 0.57–1.00)
GFR calc Af Amer: 121 mL/min/1.73
GFR calc non Af Amer: 105 mL/min/1.73
Globulin, Total: 1.9 g/dL (ref 1.5–4.5)
Glucose: 98 mg/dL (ref 65–99)
Potassium: 4.1 mmol/L (ref 3.5–5.2)
Sodium: 143 mmol/L (ref 134–144)
Total Protein: 6.4 g/dL (ref 6.0–8.5)

## 2018-12-28 LAB — QUANTIFERON-TB GOLD PLUS
QuantiFERON Mitogen Value: >10 [IU]/mL
QuantiFERON Nil Value: 0.02 [IU]/mL
QuantiFERON TB1 Ag Value: 0.02 [IU]/mL
QuantiFERON TB2 Ag Value: 0.01 [IU]/mL
QuantiFERON-TB Gold Plus: NEGATIVE

## 2018-12-28 LAB — SYPHILIS: RPR W/REFLEX TO RPR TITER AND TREPONEMAL ANTIBODIES, TRADITIONAL SCREENING AND DIAGNOSIS ALGORITHM: RPR Ser Ql: NONREACTIVE

## 2018-12-28 MED ORDER — CLINDAMYCIN HCL 300 MG PO CAPS: 300.0000 mg | ORAL_CAPSULE | Freq: Three times a day (TID) | ORAL | 0 refills | Status: DC

## 2018-12-28 NOTE — ED Triage Notes (Signed)
To ED for further eval of left facial numbness. States this has been an ongoing problem for the past 2 yrs. Has had upper tooth extraction and treated multiple times for bone infection at site of extraction. Pt has a new pcp and has a ct scan ordered for October. No fevers. Pt finished clindamycin a week ago. Minimal to no swelling noted to right face. Speech is very clear and pt is a good historian to her situation

## 2018-12-28 NOTE — ED Provider Notes (Signed)
[facility] Emergency Department Provider Note MRN:  [id number]  Arrival date & time: 12/28/18     Chief Complaint   Numbness   History of Present Illness   Catherine Olsen is a 48 y.o. year-old female with a history of bipolar disorder, chronic facial pain presenting to the ED with chief complaint of numbness.  Patient reports a 2-year history of facial pain and numbness to the left side of her face related to an implant infection.  She has been seen here multiple times in the emergency department.  She had a recent admission at [facility] for possible osteomyelitis of the facial bones.  MRI was performed just a few days ago.  It was determined that she likely did not have osteomyelitis and she was discharged.  She has been in contact with a local radiologist, however, who continues to tell her that she has chronic osteomyelitis and needs long-term antibiotics.  She is convinced of this.  She denies fever, no nausea or vomiting, no trouble swallowing, no neck pain, no abdominal pain, no chest pain or shortness of breath, no other complaints.  Review of Systems  A complete 10 system review of systems was obtained and all systems are negative except as noted in the HPI and PMH.   Patient's Health History    Past Medical History:  Diagnosis Date  . Abnormal uterine bleeding (AUB) 06/25/2009   Qualifier: Diagnosis of  By: Cathren Laine MD, Ankit    . Allergy   . Anxiety   . Arthritis    hands, lower back, knees  . Asthma   . Bipolar 1 disorder ([place])   . COPD (chronic obstructive pulmonary disease) ([place])   . Depression   . Endometriosis   . Fibromyalgia   . Headache(784.0)    otc med prn  . Migraine   . Pituitary tumor    microadenoma  . PONV (postoperative nausea and vomiting)   . PTSD (post-traumatic stress disorder)   . Scoliosis   . Termination of pregnancy    x 2 at age 55 and 48 yrs old  . Tobacco abuse 03/04/2015    Past Surgical History:   Procedure Laterality Date  . BREAST BIOPSY Right 05/19/2007  . BREAST BIOPSY Right 06/02/2007  . BREAST SURGERY    . DILATION AND CURETTAGE OF UTERUS    . FACIAL COSMETIC SURGERY     right cheek  . LAPAROSCOPY Right 11/11/2013   Procedure: LAPAROSCOPY OPERATIVE with  Drainage of  RIGHT Ovarian ENDOMETRIOMA;  Surgeon: Elveria Royals, MD;  Location: [facility];  Service: Gynecology;  Laterality: Right;  . NOSE SURGERY     rhinoplasty at age 23 yrs  . WISDOM TOOTH EXTRACTION      Family History  Problem Relation Age of Onset  . Diabetes Mother   . Hypertension Mother   . Stroke Mother 69       CVA  . Mental illness Mother        no diagnosis; personality disorder  . Hyperlipidemia Father   . Hypertension Father   . COPD Father   . Alpha-1 antitrypsin deficiency Father   . Asthma Father   . Alpha-1 antitrypsin deficiency Brother   . Asthma Brother   . Diabetes Maternal Grandmother   . Heart disease Maternal Grandmother   . Hyperlipidemia Maternal Grandmother   . Hypertension Maternal Grandmother   . Mental illness Maternal Grandmother   . Heart disease Maternal Grandfather   . Hyperlipidemia  Maternal Grandfather   . Hypertension Maternal Grandfather   . Asthma Maternal Grandfather   . Heart disease Paternal Grandfather   . Hyperlipidemia Paternal Grandfather   . Hypertension Paternal Grandfather   . Stroke Paternal Grandfather   . Alzheimer's disease Paternal Grandmother   . Allergic rhinitis Neg Hx   . Angioedema Neg Hx   . Eczema Neg Hx   . Urticaria Neg Hx   . Immunodeficiency Neg Hx     Social History   Socioeconomic History  . Marital status: Divorced    Spouse name: Not on file  . Number of children: 0  . Years of education: Not on file  . Highest education level: Bachelor's degree (e.g., BA, AB, BS)  Occupational History  . Occupation: disability    Comment: mental illness  Social Needs  . Financial resource strain: Not on file  . Food insecurity    Worry:  Not on file    Inability: Not on file  . Transportation needs    Medical: Not on file    Non-medical: Not on file  Tobacco Use  . Smoking status: Current Some Day Smoker    Packs/day: 2.00    Years: 29.00    Pack years: 58.00    Types: Cigarettes  . Smokeless tobacco: Never Used  Substance and Sexual Activity  . Alcohol use: Not Currently  . Drug use: Not Currently    Types: "Crack" cocaine    Comment: Heavy user per patient, last use Tuesday 11/08/13  LAST CRACK  2015  . Sexual activity: Not Currently    Comment: abortion at 55 and 15yrs. of age   Lifestyle  . Physical activity    Days per week: Not on file    Minutes per session: Not on file  . Stress: Not on file  Relationships  . Social Herbalist on phone: Not on file    Gets together: Not on file    Attends religious service: Not on file    Active member of club or organization: Not on file    Attends meetings of clubs or organizations: Not on file    Relationship status: Not on file  . Intimate partner violence    Fear of current or ex partner: Not on file    Emotionally abused: Not on file    Physically abused: Not on file    Forced sexual activity: Not on file  Other Topics Concern  . Not on file  Social History Narrative   Marital status: divorced; not dating      Children: none      Lives: alone with mom      Employment: disability for mental illness in 1997.  Hospitalizations x 9 in past.      Tobacco: 1 ppd x since age 54. Decreasing in 2018.      Alcohol: socially; beers.      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      Exercise: yoga daily; exercise daily.      ADLs: independent with ADLs; no car; depends on others for transportation.      Patient is right-handed. She is divorced and lives with her mother. She drinks 2-3 cups of 1/2 caffeine coffe a day. She walks occasionally for exercise.     Physical Exam  Vital Signs and Nursing Notes reviewed Vitals:   12/28/18  0607  BP: (!) 140/100  Pulse: (!) 108  Resp: 14  Temp: 97.7  F (36.5 C)  SpO2: 100%    CONSTITUTIONAL: Well-appearing, NAD NEURO:  Alert and oriented x 3, no focal deficits EYES:  eyes equal and reactive ENT/NECK:  no LAD, no JVD; no appreciable asymmetry to the face, no edema, no tenderness to palpation, no gingival abscess CARDIO: Regular rate, well-perfused, normal S1 and S2 PULM:  CTAB no wheezing or rhonchi GI/GU:  normal bowel sounds, non-distended, non-tender MSK/SPINE:  No gross deformities, no edema SKIN:  no rash, atraumatic PSYCH:  Appropriate speech and behavior  Diagnostic and Interventional Summary    Labs Reviewed - No data to display  No orders to display    Medications - No data to display   Procedures Critical Care  ED Course and Medical Decision Making  I have reviewed the triage vital signs and the nursing notes.  Pertinent labs & imaging results that were available during my care of the patient were reviewed by me and considered in my medical decision making (see below for details).  Chronic facial pain, MRI few days ago at [facility] does show some question of chronic osteomyelitis.  Patient is not systemically ill and there is no indication for IV antibiotics, no indication for admission.  She is here mostly out of frustration as she continues to have symptoms.  Reassurance provided, patient's symptoms seem to worsen after she completes a course of antibiotics and is off of them for a while.  Will refill clindamycin, referred to infectious disease, advised to continue follow-up with maxillofacial specialist.  She has follow-up and a CT scan scheduled in October.   Barth Kirks. Sedonia Small, [place] [email or web address]  Final Clinical Impressions(s) / ED Diagnoses     ICD-10-CM   1. Facial pain  R51     ED Discharge Orders         Ordered    clindamycin (CLEOCIN) 300 MG capsule  3 times daily      12/28/18 0915          Discharge Instructions Discussed with and Provided to Patient:   Discharge Instructions     You were evaluated in the Emergency Department and after careful evaluation, we did not find any emergent condition requiring admission or further testing in the hospital.  Your exam/testing today was overall reassuring.  We recommend follow-up with the infectious disease specialist.  Until then, please take the clindamycin as prescribed.  Please return to the Emergency Department if you experience any worsening of your condition.  We encourage you to follow up with a primary care provider.  Thank you for allowing [place] to be a part of your care.        Maudie Flakes, MD 12/28/18 [phone]

## 2018-12-28 NOTE — Discharge Instructions (Addendum)
You were evaluated in the Emergency Department and after careful evaluation, we did not find any emergent condition requiring admission or further testing in the hospital.  Your exam/testing today was overall reassuring.  We recommend follow-up with the infectious disease specialist.  Until then, please take the clindamycin as prescribed.  Please return to the Emergency Department if you experience any worsening of your condition.  We encourage you to follow up with a primary care provider.  Thank you for allowing [place] to be a part of your care.

## 2018-12-29 ENCOUNTER — Encounter: Payer: Self-pay | Admitting: Family Medicine

## 2018-12-29 ENCOUNTER — Telehealth: Payer: Self-pay | Admitting: Family Medicine

## 2018-12-29 NOTE — Telephone Encounter (Signed)
Let patient know that I am awaiting a call back from Dr. Aurora Mask in radiology to see if CT or MRI needed based on her last imaging study

## 2018-12-29 NOTE — Telephone Encounter (Signed)
Patient called stating she needs to get a STAT CT order put in for her bone infection per the ED physician she was seen by. States it has now begun affecting her vision. Please follow up.

## 2018-12-29 NOTE — Telephone Encounter (Signed)
Will route to PCP 

## 2018-12-30 ENCOUNTER — Encounter: Payer: Self-pay | Admitting: Family Medicine

## 2018-12-30 ENCOUNTER — Telehealth: Payer: Self-pay | Admitting: Family Medicine

## 2018-12-30 NOTE — Telephone Encounter (Signed)
I spoke with patient to let her know that radiology has recommended that the better test to look for osteomyelitis would be a MRI not the CT scan that she requested at her most recent office visit. Per patient she has an appointment tomorrow with the maxillofacial in [facility] but she does not know what they are going to order and she would like to still have the CT scan because the endocrinologist and ENT that she is following up with have based their appointments on the CT that she is scheduled to have. Will leave the order for the CT in place.

## 2018-12-30 NOTE — Telephone Encounter (Signed)
Patient was notified by MyChart from PCP.

## 2018-12-31 ENCOUNTER — Other Ambulatory Visit: Payer: Self-pay | Admitting: Surgery

## 2018-12-31 DIAGNOSIS — Z1231 Encounter for screening mammogram for malignant neoplasm of breast: Secondary | ICD-10-CM

## 2019-01-04 ENCOUNTER — Ambulatory Visit (INDEPENDENT_AMBULATORY_CARE_PROVIDER_SITE_OTHER): Payer: Medicare Other | Admitting: Internal Medicine

## 2019-01-04 ENCOUNTER — Encounter: Payer: Self-pay | Admitting: Internal Medicine

## 2019-01-04 ENCOUNTER — Other Ambulatory Visit: Payer: Self-pay

## 2019-01-04 VITALS — BP 110/83 | HR 103

## 2019-01-04 DIAGNOSIS — M6020 Foreign body granuloma of soft tissue, not elsewhere classified, unspecified site: Secondary | ICD-10-CM

## 2019-01-04 NOTE — Patient Instructions (Signed)
Please take clindamycin 300mg  four times per day for the next 14 days to see if improvement in symptoms.  In meantime, please follow up with your original surgeon or ENT to discuss feasibility of removal of implants as this maybe an indolent infection vs. Reaction to material from implant.
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
## 2019-01-05 ENCOUNTER — Telehealth: Payer: Self-pay | Admitting: *Deleted

## 2019-01-05 ENCOUNTER — Other Ambulatory Visit: Payer: Self-pay | Admitting: *Deleted

## 2019-01-05 DIAGNOSIS — F3111 Bipolar disorder, current episode manic without psychotic features, mild: Secondary | ICD-10-CM | POA: Diagnosis not present

## 2019-01-05 DIAGNOSIS — F4312 Post-traumatic stress disorder, chronic: Secondary | ICD-10-CM | POA: Diagnosis not present

## 2019-01-05 MED ORDER — CLINDAMYCIN HCL 300 MG PO CAPS: 300.0000 mg | ORAL_CAPSULE | Freq: Four times a day (QID) | ORAL | 0 refills | Status: AC

## 2019-01-05 NOTE — Telephone Encounter (Signed)
Per verbal from Dr Baxter Flattery called patient to advise her Clinda was sent in for her at 300 mg 4x daily for 2 weeks. Left a message and advised her any other questions can be discussed at the visit in 2 weeks.

## 2019-01-07 ENCOUNTER — Ambulatory Visit (HOSPITAL_COMMUNITY): Payer: Medicare Other

## 2019-01-10 DIAGNOSIS — F419 Anxiety disorder, unspecified: Secondary | ICD-10-CM | POA: Diagnosis not present

## 2019-01-10 DIAGNOSIS — T847XXA Infection and inflammatory reaction due to other internal orthopedic prosthetic devices, implants and grafts, initial encounter: Secondary | ICD-10-CM | POA: Diagnosis not present

## 2019-01-11 NOTE — Telephone Encounter (Signed)
Patient called again with questions about antibiotics and next steps in treatment. She reports that she saw a maxillofacial provider 01/10/19 and was also told that is could be the facial implants and she has many questions. Advised her to write them down and she can discuss them with Dr Baxter Flattery at her appt on Tuesday 01/18/19.

## 2019-01-12 NOTE — Telephone Encounter (Signed)
I will call her to see if I can answer any of her questions. Otherwise, can answer her questions at next appt. If she calls about her implants -> would refer her back to her maxillofacial surgeon.. maybe explain to her that the abtx is only a bandaid to what she needs -> surgical evaluation

## 2019-01-13 ENCOUNTER — Ambulatory Visit: Payer: Medicare Other | Admitting: Internal Medicine

## 2019-01-18 ENCOUNTER — Other Ambulatory Visit: Payer: Self-pay

## 2019-01-18 ENCOUNTER — Encounter: Payer: Self-pay | Admitting: Internal Medicine

## 2019-01-18 ENCOUNTER — Ambulatory Visit (INDEPENDENT_AMBULATORY_CARE_PROVIDER_SITE_OTHER): Payer: Medicare Other | Admitting: Internal Medicine

## 2019-01-18 VITALS — BP 133/92 | HR 87 | Temp 98.3°F | Wt 138.2 lb

## 2019-01-18 DIAGNOSIS — F3163 Bipolar disorder, current episode mixed, severe, without psychotic features: Secondary | ICD-10-CM | POA: Diagnosis not present

## 2019-01-18 DIAGNOSIS — G8929 Other chronic pain: Secondary | ICD-10-CM

## 2019-01-18 DIAGNOSIS — K089 Disorder of teeth and supporting structures, unspecified: Secondary | ICD-10-CM | POA: Diagnosis not present
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
## 2019-01-20 ENCOUNTER — Other Ambulatory Visit: Payer: Self-pay

## 2019-01-20 ENCOUNTER — Encounter: Payer: Self-pay | Admitting: Family Medicine

## 2019-01-20 ENCOUNTER — Ambulatory Visit: Payer: Medicare Other | Attending: Family Medicine | Admitting: Family Medicine

## 2019-01-20 VITALS — BP 123/81 | HR 94 | Temp 97.6°F | Ht 68.0 in | Wt 139.6 lb

## 2019-01-20 DIAGNOSIS — H6693 Otitis media, unspecified, bilateral: Secondary | ICD-10-CM

## 2019-01-20 DIAGNOSIS — H6121 Impacted cerumen, right ear: Secondary | ICD-10-CM

## 2019-01-20 DIAGNOSIS — F4312 Post-traumatic stress disorder, chronic: Secondary | ICD-10-CM | POA: Diagnosis not present

## 2019-01-20 DIAGNOSIS — E237 Disorder of pituitary gland, unspecified: Secondary | ICD-10-CM | POA: Diagnosis not present

## 2019-01-20 DIAGNOSIS — J309 Allergic rhinitis, unspecified: Secondary | ICD-10-CM | POA: Diagnosis not present

## 2019-01-20 DIAGNOSIS — F3112 Bipolar disorder, current episode manic without psychotic features, moderate: Secondary | ICD-10-CM | POA: Diagnosis not present

## 2019-01-20 MED ORDER — CEFDINIR 300 MG PO CAPS: 300.0000 mg | ORAL_CAPSULE | Freq: Two times a day (BID) | ORAL | 0 refills | Status: DC

## 2019-01-20 NOTE — Progress Notes (Signed)
Established Patient Office Visit  Subjective:  Patient ID: Catherine Olsen, female    DOB: 03-Sep-1970  Age: 48 y.o. MRN: [id number]  CC:  Chief Complaint  Patient presents with  . Follow-up  . Otitis Media    HPI Catherine Olsen presents for follow-up of her visit on 12/12/2020 at which time she was diagnosed with right otitis media, eustachian tube dysfunction and allergic rhinitis.  Patient states that she continues to have sensation of ear discomfort/pain which is now in both ears as well as continued nasal congestion and sensation of facial swelling.  She states that since her last visit she was evaluated at [facility] orofacial clinic and then referred to [facility] for orofacial specialist as patient now believes that her cheekbone implants may be leaking toxic substances into her system.  Patient denies any recent fever or chills.  She does have occasional headaches and occasional dizziness.  No shortness of breath or cough.  No chest pain or palpitations.  No current abdominal pain or nausea and vomiting.  She reports mild decreased hearing in the right ear.        Patient also reports that she would like to have blood work at today's visit in follow-up of her history of a pituitary disorder.  She states that in the past she was told that her sphenoid bone extends too far back which is putting pressure on her pituitary gland.  She reports that she has been followed in the past by a endocrinologist at [facility].  She states however that she would like to have a particular blood test done at today's visit in follow-up of her pituitary abnormalities.  She states that she did not hear from the endocrinologist that she was referred to locally after calling to request further follow-up of a pituitary problem.  Patient then stated that she wanted to have the blood test done today because she did not trust the local endocrinology office to whom she had been referred after hearing about their treatment  plan.  Past Medical History:  Diagnosis Date  . Abnormal uterine bleeding (AUB) 06/25/2009   Qualifier: Diagnosis of  By: Cathren Laine MD, Ankit    . Allergy   . Anxiety   . Arthritis    hands, lower back, knees  . Asthma   . Bipolar 1 disorder ([place])   . COPD (chronic obstructive pulmonary disease) ([place])   . Depression   . Endometriosis   . Fibromyalgia   . Headache(784.0)    otc med prn  . Migraine   . Pituitary tumor    microadenoma  . PONV (postoperative nausea and vomiting)   . PTSD (post-traumatic stress disorder)   . Scoliosis   . Termination of pregnancy    x 2 at age 27 and 48 yrs old  . Tobacco abuse 03/04/2015    Past Surgical History:  Procedure Laterality Date  . BREAST BIOPSY Right 05/19/2007  . BREAST BIOPSY Right 06/02/2007  . BREAST SURGERY    . DILATION AND CURETTAGE OF UTERUS    . FACIAL COSMETIC SURGERY     right cheek  . LAPAROSCOPY Right 11/11/2013   Procedure: LAPAROSCOPY OPERATIVE with  Drainage of  RIGHT Ovarian ENDOMETRIOMA;  Surgeon: Elveria Royals, MD;  Location: [place] [facility];  Service: Gynecology;  Laterality: Right;  . NOSE SURGERY     rhinoplasty at age 52 yrs  . WISDOM TOOTH EXTRACTION      Family History  Problem Relation Age  of Onset  . Diabetes Mother   . Hypertension Mother   . Stroke Mother 37       CVA  . Mental illness Mother        no diagnosis; personality disorder  . Hyperlipidemia Father   . Hypertension Father   . COPD Father   . Alpha-1 antitrypsin deficiency Father   . Asthma Father   . Alpha-1 antitrypsin deficiency Brother   . Asthma Brother   . Diabetes Maternal Grandmother   . Heart disease Maternal Grandmother   . Hyperlipidemia Maternal Grandmother   . Hypertension Maternal Grandmother   . Mental illness Maternal Grandmother   . Heart disease Maternal Grandfather   . Hyperlipidemia Maternal Grandfather   . Hypertension Maternal Grandfather   . Asthma Maternal Grandfather   . Heart disease Paternal Grandfather   .  Hyperlipidemia Paternal Grandfather   . Hypertension Paternal Grandfather   . Stroke Paternal Grandfather   . Alzheimer's disease Paternal Grandmother   . Allergic rhinitis Neg Hx   . Angioedema Neg Hx   . Eczema Neg Hx   . Urticaria Neg Hx   . Immunodeficiency Neg Hx     Social History   Socioeconomic History  . Marital status: Divorced    Spouse name: Not on file  . Number of children: 0  . Years of education: Not on file  . Highest education level: Bachelor's degree (e.g., BA, AB, BS)  Occupational History  . Occupation: disability    Comment: mental illness  Social Needs  . Financial resource strain: Not on file  . Food insecurity    Worry: Not on file    Inability: Not on file  . Transportation needs    Medical: Not on file    Non-medical: Not on file  Tobacco Use  . Smoking status: Current Some Day Smoker    Packs/day: 2.00    Years: 29.00    Pack years: 58.00    Types: Cigarettes  . Smokeless tobacco: Never Used  Substance and Sexual Activity  . Alcohol use: Not Currently  . Drug use: Not Currently    Types: "Crack" cocaine    Comment: Heavy user per patient, last use Tuesday 11/08/13  LAST CRACK  2015  . Sexual activity: Not Currently    Comment: abortion at 44 and 42yrs. of age   Lifestyle  . Physical activity    Days per week: Not on file    Minutes per session: Not on file  . Stress: Not on file  Relationships  . Social Herbalist on phone: Not on file    Gets together: Not on file    Attends religious service: Not on file    Active member of club or organization: Not on file    Attends meetings of clubs or organizations: Not on file    Relationship status: Not on file  . Intimate partner violence    Fear of current or ex partner: Not on file    Emotionally abused: Not on file    Physically abused: Not on file    Forced sexual activity: Not on file  Other Topics Concern  . Not on file  Social History Narrative   Marital status:  divorced; not dating      Children: none      Lives: alone with mom      Employment: disability for mental illness in 1997.  Hospitalizations x 9 in past.      Tobacco:  1 ppd x since age 43. Decreasing in 2018.      Alcohol: socially; beers.      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      Exercise: yoga daily; exercise daily.      ADLs: independent with ADLs; no car; depends on others for transportation.      Patient is right-handed. She is divorced and lives with her mother. She drinks 2-3 cups of 1/2 caffeine coffe a day. She walks occasionally for exercise.    Outpatient Medications Prior to Visit  Medication Sig Dispense Refill  . acetaminophen (TYLENOL) 500 MG tablet Take 1,000 mg by mouth 3 (three) times daily.     Marland Kitchen EPINEPHrine 0.3 mg/0.3 mL IJ SOAJ injection Inject 0.3 mLs into the skin once.  2  . estradiol (CLIMARA - DOSED IN MG/24 HR) 0.025 mg/24hr patch Place 0.025 mg onto the skin once a week.   12  . fluticasone (FLONASE) 50 MCG/ACT nasal spray 1 spray per nostril twice a day if needed for stuffy nose 18.2 g 5  . ibuprofen (ADVIL,MOTRIN) 800 MG tablet TAKE 1 TABLET(800 MG) BY MOUTH EVERY 8 HOURS AS NEEDED (Patient taking differently: 800 mg 4 (four) times daily. ) 30 tablet 0  . loratadine (CLARITIN) 10 MG tablet Take 10 mg by mouth daily.     . Multiple Vitamin (MULTIVITAMIN) capsule Take 1 capsule by mouth 2 (two) times a week.     . temazepam (RESTORIL) 15 MG capsule Take 45 mg by mouth at bedtime.  1  . alprazolam (XANAX) 2 MG tablet Take 1 tablet by mouth daily as needed.    . celecoxib (CELEBREX) 200 MG capsule Take 200 mg by mouth daily.    . propranolol (INDERAL) 10 MG tablet propranolol 10 mg tablet  TK 1/2 TO 1 T PO BID FOR RESTLESSNESS.  OR MAY TAKE 1/2 T UP TO QID     No facility-administered medications prior to visit.     Allergies  Allergen Reactions  . Other Anaphylaxis    Idiopathic (possible binder or preservatives in  medication)    . Prednisone Anaphylaxis    Steroids Steroids ( can take with benadryl )   Can not take higher than 40 mg   . Sulfamethoxazole Anaphylaxis  . Oxycodone Other (See Comments)    Severe blindness?  . Abilify [Aripiprazole]     Generic Abilify--causes severe neurological problems. Can use name brand  . Nicotine Swelling    Face swelled around implants when tried to quit smoking  . Penicillins Hives    Has patient had a PCN reaction causing immediate rash, facial/tongue/throat swelling, SOB or lightheadedness with hypotension: no Has patient had a PCN reaction causing severe rash involving mucus membranes or skin necrosis: NO Has patient had a PCN reaction that required hospitalizationNO Has patient had a PCN reaction occurring within the last 10 years:NO If all of the above answers are "NO", then may proceed with Cephalosporin use.   Marland Kitchen Percocet [Oxycodone-Acetaminophen] Palpitations  . Sulfa Antibiotics Hives    Pt states she is not really allergic to sulfa, but to sulfites.  . Sulfites Other (See Comments)    unknown    ROS Review of Systems  Constitutional: Positive for fatigue. Negative for chills and fever.  HENT: Positive for congestion, dental problem, ear pain, facial swelling, postnasal drip and sinus pressure. Negative for nosebleeds, rhinorrhea, sore throat and trouble swallowing.   Respiratory: Positive for shortness of breath (occasional).  Negative for cough.   Cardiovascular: Negative for chest pain, palpitations and leg swelling.  Gastrointestinal: Negative for abdominal pain, constipation, diarrhea and nausea.  Endocrine: Negative for polydipsia, polyphagia and polyuria.  Genitourinary: Positive for frequency (occasional). Negative for dysuria.  Musculoskeletal: Positive for back pain, myalgias and neck pain. Negative for neck stiffness.  Neurological: Positive for dizziness (recurrent) and headaches (recurrent/chronic).  Hematological: Positive for  adenopathy. Does not bruise/bleed easily.  Psychiatric/Behavioral: Negative for self-injury and suicidal ideas. The patient is nervous/anxious.       Objective:    Physical Exam  Constitutional: She is oriented to person, place, and time. She appears well-developed and well-nourished.  HENT:  Right Ear: Tympanic membrane is erythematous.  Left Ear: Hearing, external ear and ear canal normal. Tympanic membrane is erythematous and retracted. Tympanic membrane is not bulging.  Right TM was initially obscured by cerumen (discussed ENT referral for removal of impacted cerumen as patient likely still with infection); right TM with erythema and thickening s/p cerumen removal via ear lavage done by RMA  Neck: Normal range of motion. Neck supple.  Cardiovascular: Normal rate and regular rhythm.  Pulmonary/Chest: Effort normal and breath sounds normal.  Abdominal: Soft. There is no abdominal tenderness. There is no rebound and no guarding.  Musculoskeletal:     Comments: No CVA tenderness  Lymphadenopathy:    She has cervical adenopathy (shotty right upper cervical chain lymph nodes (2); non-tender to palp).  Neurological: She is alert and oriented to person, place, and time. No cranial nerve deficit.  Psychiatric:  Patient appears anxious and has pressure speech at times  Nursing note and vitals reviewed.   BP 123/81 (BP Location: Left Arm, Patient Position: Sitting, Cuff Size: Normal)   Pulse 94   Temp 97.6 F (36.4 C) (Oral)   Ht 5\' 8"  (1.727 m)   Wt 139 lb 9.6 oz (63.3 kg)   LMP 11/21/2014   SpO2 97%   BMI 21.23 kg/m  Wt Readings from Last 3 Encounters:  01/20/19 139 lb 9.6 oz (63.3 kg)  01/18/19 138 lb 3.2 oz (62.7 kg)  12/23/18 141 lb (64 kg)     Health Maintenance Due  Topic Date Due  . PAP SMEAR-Modifier  01/25/2018     Lab Results  Component Value Date   TSH 2.000 10/29/2017   Lab Results  Component Value Date   WBC 7.5 09/07/2018   HGB 16.0 (H) 09/07/2018    HCT 46.3 (H) 09/07/2018   MCV 94.1 09/07/2018   PLT 276 09/07/2018   Lab Results  Component Value Date   NA 143 12/24/2018   K 4.1 12/24/2018   CO2 22 12/24/2018   GLUCOSE 98 12/24/2018   BUN 20 12/24/2018   CREATININE 0.67 12/24/2018   BILITOT 0.3 12/24/2018   ALKPHOS 59 12/24/2018   AST 11 12/24/2018   ALT 8 12/24/2018   PROT 6.4 12/24/2018   ALBUMIN 4.5 12/24/2018   CALCIUM 9.5 12/24/2018   ANIONGAP 9 09/07/2018   Lab Results  Component Value Date   CHOL 173 10/29/2017   Lab Results  Component Value Date   HDL 69 10/29/2017   Lab Results  Component Value Date   LDLCALC 90 10/29/2017   Lab Results  Component Value Date   TRIG 69 10/29/2017   Lab Results  Component Value Date   CHOLHDL 2.5 10/29/2017   Lab Results  Component Value Date   HGBA1C 5.5 02/22/2015      Assessment & Plan:  1.  Bilateral otitis media, unspecified otitis media type Patient with bilateral otitis media.  She agrees to be placed on Omnicef 300 mg twice daily for 10 days.  She is encouraged to follow-up with ENT and the referral will be placed as patient states that she did not hear from ENT after her last visit.  Patient may take over-the-counter pain medication if needed and is also encouraged to use an antihistamine to help with congestion as patient does not wish to use nasal sprays such as Flonase. - Ambulatory referral to ENT - cefdinir (OMNICEF) 300 MG capsule; Take 1 capsule (300 mg total) by mouth 2 (two) times daily for 10 days.  Dispense: 20 capsule; Refill: 0  2. Impacted cerumen of right ear Patient with cerumen build/impacted cerumen in the right ear on exam and discussed with patient that this should be removed by ENT. ENT was done at last visit per patient request but she reports that she has not yet been contacted about an appointment. New referral placed but patient insisted that ear lavage be done today in office despite letting patient know that there was a good chance  that this might cause ear pain as she likely has an ear infection as well as possibility of dizziness or nausea. Ear wax was successfully removed by RMA via ear lavage and patient did have erythema of the TM. She did develop dizziness an nausea and had to lie down in darkened exam room with the door opened to let in light s/p ear lavage due to onset of dizziness and nausea which resolved.  - Ear Lavage  3. Allergic rhinitis, unspecified seasonality, unspecified trigger She is encouraged to use an over-the-counter antihistamine such as Claritin/loratadine to help with congestion and postnasal drainage.  Patient is also encouraged to follow-up with ENT. - Ambulatory referral to ENT  4. Pituitary abnormality [facility]) Patient called after her last visit here to request endocrinology referral due to a history of a pituitary abnormality. Today she initially stated that she was not contacted by the local endocrinologist to whom she was referred but later said that when she talked with someone from that office she did not like that path that they wanted to take. Patient repeatedly requested a specific blood test be done today but when she was told that endocrinology could order if necessary patient requested referral to an endocrinologist that she states she saw for several years in the past about this issue and referral was made to [facility] per patient preference.  - Ambulatory referral to Endocrinology  An After Visit Summary was printed and given to the patient.  Follow-up: Return in about 2 weeks (around 02/03/2019) for 2-3 weeks but sooner if not improving.    Antony Blackbird, MD

## 2019-01-20 NOTE — Patient Instructions (Signed)
Otitis Media, Adult  Otitis media means that the middle ear is red and swollen (inflamed) and full of fluid. The condition usually goes away on its own. Follow these instructions at home:  Take over-the-counter and prescription medicines only as told by your doctor.  If you were prescribed an antibiotic medicine, take it as told by your doctor. Do not stop taking the antibiotic even if you start to feel better.  Keep all follow-up visits as told by your doctor. This is important. Contact a doctor if:  You have bleeding from your nose.  There is a lump on your neck.  You are not getting better in 5 days.  You feel worse instead of better. Get help right away if:  You have pain that is not helped with medicine.  You have swelling, redness, or pain around your ear.  You get a stiff neck.  You cannot move part of your face (paralyzed).  You notice that the bone behind your ear hurts when you touch it.  You get a very bad headache. Summary  Otitis media means that the middle ear is red, swollen, and full of fluid.  This condition usually goes away on its own. In some cases, treatment may be needed.  If you were prescribed an antibiotic medicine, take it as told by your doctor. This information is not intended to replace advice given to you by your health care provider. Make sure you discuss any questions you have with your health care provider. Document Released: 09/10/2007 Document Revised: 03/06/2017 Document Reviewed: 04/14/2016 Elsevier Patient Education  2020 [employer]  

## 2019-01-25 ENCOUNTER — Other Ambulatory Visit (HOSPITAL_BASED_OUTPATIENT_CLINIC_OR_DEPARTMENT_OTHER): Payer: Medicare Other | Admitting: Family Medicine

## 2019-01-25 ENCOUNTER — Telehealth: Payer: Self-pay | Admitting: *Deleted

## 2019-01-25 ENCOUNTER — Telehealth: Payer: Self-pay | Admitting: Family Medicine

## 2019-01-25 ENCOUNTER — Encounter: Payer: Self-pay | Admitting: Family Medicine

## 2019-01-25 DIAGNOSIS — R519 Headache, unspecified: Secondary | ICD-10-CM | POA: Diagnosis not present

## 2019-01-25 MED ORDER — DICLOFENAC SODIUM 75 MG PO TBEC: 75.0000 mg | DELAYED_RELEASE_TABLET | Freq: Two times a day (BID) | ORAL | 0 refills | Status: DC

## 2019-01-25 NOTE — Telephone Encounter (Signed)
Patient states she is pain.  She is maxed out on OTC medications taking ibuprofen 800 mg  and Tylenol 1000 mg Tid.   Would like to know if something stronger would be able to be called in. Please advise. Does not tolerate Percocet. Prefers Norco 5/325mg .   Would like to know if she can get an referral to a pain specialist and if we could expedite the referral to Dr. Dalphine Handing, Plastic Surgeon in [place].

## 2019-01-25 NOTE — Telephone Encounter (Signed)
I can place the referral to the plastic surgeon she is requesting but I was not aware of the day for this referral.  She may want to call the maxillofacial/dental clinic at [facility] to help facilitate this referral.  I am not sure that area of her pain at this current time-is she having ear pain or her chronic facial pain.  Please respond if she mentioned the area of her pain so that pain clinic referral can be made.  We will send in prescription for 5-day supply of Tylenol 3 to help with her current pain unless this is contraindicated by drug allergies on review of chart

## 2019-01-25 NOTE — Telephone Encounter (Signed)
Facial pain, cheek pain, ear pain.   Please advise.

## 2019-01-25 NOTE — Progress Notes (Signed)
Patient ID: Catherine Olsen, female   DOB: 06-06-1970, 48 y.o.   MRN: [id number]   Patient left message earlier today requesting to speak with a nurse regarding ear pain and facial pain.  Patient is currently on [place] for treatment of ear infections and has upcoming appointment with ENT.  Patient requested pain medication, Norco, and referral to pain management as well as referral to a doctor [place] and plastic surgery at [facility]-presumably this is in follow-up of her maxillary implants but message did not specify.  Patient with complaint of ibuprofen and over-the-counter Tylenol not been effective.  Original plan was to send in prescription for Tylenol 3 but patient may have sensitivities to this medication therefore a prescription will be sent in for Voltaren 75 mg twice daily  as needed to take after eating to help with her facial pain.

## 2019-01-25 NOTE — Telephone Encounter (Signed)
Patient called with many questions. She is worried because she finished the oral clindamycin 10/16, but feels she has swelling again in her face (states repeatedly that "no one will see this except me"), is concerned that the infection in her bone/skin/vasculature has now crossed into her middle ear canal (PCP washed wax out of her ears, started her on 10-day course of oral omnicef 10/15 for otitis media), worries that she will next lose her vision or have neurological consequences for being off antibiotics before she gets to the endodontist and plastic surgeon 11/5 and 11/6 respectively.   She states she used to start to feel these symptoms 1 week or more after completing antibiotics, is worried that now she has these feelings in 2 days. She wants to know when she should come back here for a work in appointment to get back on antibiotics and when the symptoms would warrant an emergency room visit (although she states again that no provider is ever able to visulally appreciate the symptoms she describes). RN encouraged patient to follow her doctors' advice, that Dr Baxter Flattery knew she would be off antibiotics for the endodontist and plastic surgeon appointments, that if she felt her symptoms were especially concerning she should go for emergent evaluation. Patient stated she felt she couldn't go to [facility] as they wouldn't be able to see the swelling, stated she would go to [facility] if she was worried. Landis Gandy, RN

## 2019-01-25 NOTE — Telephone Encounter (Signed)
On review of chart, patient may have sensitivity to Tylenol 3.  Prescription sent to patient's pharmacy for diclofenac 75 mg to take twice daily after meal as needed for pain.  She may discontinue the use of ibuprofen.  She should keep upcoming appointment with ear nose and throat.  Referral was placed to her requested plastic surgeon at [facility].  She may also wish to use warm compresses to her cheeks to help with facial pain.  Go to urgent care or emergency department if she has had any acute worsening of her pain.  I am not sure that a pain clinic will treat facial pain but referral will be placed.

## 2019-01-25 NOTE — Telephone Encounter (Signed)
Can you contact patient. She is currently on an antibiotic for ear infection

## 2019-01-25 NOTE — Telephone Encounter (Signed)
New Message   Pt calling requesting to speak with a nurse, states she has facial implants that are swollen with infection and ear infection. Please f/u

## 2019-01-26 NOTE — Telephone Encounter (Signed)
Patient aware of the message from Dr. Chapman Fitch. She was appreciative of the referral. Asked [facility] referral coordinator  if the referral could be expedited.   Patient states she will continue to take the medications she is currently taking. Diclofenac does not help.

## 2019-01-28 ENCOUNTER — Telehealth: Payer: Self-pay

## 2019-01-28 NOTE — Telephone Encounter (Signed)
Patient states that she was told by Dr. Gilda Crease with Radiology that she indeed has an infection within her implants. States Dr. Windell Norfolk knows Dr. Baxter Flattery and will reach out to her regarding this matter.  Patient states " this is emergent and I would like a same day appointment" LPN offered patient next available same day appointment with Dr. Baxter Flattery but unfortunately that appointment was not until 11/4 and patient declined that appointment.Patient has upcoming already on 02/16/19. LPN advise patient that if this was an urgent matter than see should go to [facility] ER. Patient stated she felt she couldn't go to [facility] they would do "nothing for me" stated she could not go to [facility] at this time  Because of "car issues". Advised patient that LPN would send Dr Baxter Flattery the message. Eugenia Mcalpine

## 2019-02-02 DIAGNOSIS — F4312 Post-traumatic stress disorder, chronic: Secondary | ICD-10-CM | POA: Diagnosis not present

## 2019-02-02 DIAGNOSIS — F3112 Bipolar disorder, current episode manic without psychotic features, moderate: Secondary | ICD-10-CM | POA: Diagnosis not present

## 2019-02-08 ENCOUNTER — Ambulatory Visit: Payer: Medicare Other | Admitting: Internal Medicine

## 2019-02-11 DIAGNOSIS — T847XXA Infection and inflammatory reaction due to other internal orthopedic prosthetic devices, implants and grafts, initial encounter: Secondary | ICD-10-CM | POA: Diagnosis not present

## 2019-02-12 ENCOUNTER — Encounter: Payer: Self-pay | Admitting: Family Medicine

## 2019-02-13 ENCOUNTER — Encounter: Payer: Self-pay | Admitting: Family Medicine

## 2019-02-13 DIAGNOSIS — J309 Allergic rhinitis, unspecified: Secondary | ICD-10-CM | POA: Diagnosis not present

## 2019-02-13 DIAGNOSIS — H6592 Unspecified nonsuppurative otitis media, left ear: Secondary | ICD-10-CM | POA: Diagnosis not present

## 2019-02-14 ENCOUNTER — Ambulatory Visit (HOSPITAL_COMMUNITY): Admission: RE | Admit: 2019-02-14 | Payer: Medicare Other | Source: Ambulatory Visit

## 2019-02-15 ENCOUNTER — Encounter: Payer: Self-pay | Admitting: Family Medicine

## 2019-02-16 ENCOUNTER — Other Ambulatory Visit (HOSPITAL_BASED_OUTPATIENT_CLINIC_OR_DEPARTMENT_OTHER): Payer: Medicare Other | Admitting: Family Medicine

## 2019-02-16 ENCOUNTER — Ambulatory Visit: Payer: Medicare Other | Admitting: Internal Medicine

## 2019-02-16 DIAGNOSIS — R519 Headache, unspecified: Secondary | ICD-10-CM

## 2019-02-16 DIAGNOSIS — M064 Inflammatory polyarthropathy: Secondary | ICD-10-CM

## 2019-02-16 NOTE — Progress Notes (Signed)
Patient ID: Catherine Olsen, female   DOB: 13-Sep-1970, 48 y.o.   MRN: [id number]   Patient has left messages through my chart that she has not yet received pain management appointment.  I will place a new referral and [facility] urgent since patient states she has not been contacted.

## 2019-02-17 ENCOUNTER — Ambulatory Visit: Payer: Medicare Other | Admitting: Internal Medicine

## 2019-02-21 ENCOUNTER — Encounter: Payer: Self-pay | Admitting: Physician Assistant

## 2019-02-21 ENCOUNTER — Encounter: Payer: Self-pay | Admitting: Family Medicine

## 2019-02-21 ENCOUNTER — Encounter: Payer: Self-pay | Admitting: Nurse Practitioner

## 2019-02-21 ENCOUNTER — Encounter: Payer: Self-pay | Admitting: Internal Medicine

## 2019-02-21 ENCOUNTER — Ambulatory Visit (HOSPITAL_COMMUNITY): Payer: Medicare Other

## 2019-02-24 NOTE — Telephone Encounter (Signed)
Patient FYI 

## 2019-02-24 NOTE — Telephone Encounter (Signed)
I contacted [facility] medical  And they told me that they didn't received  . I re faxed again   .

## 2019-02-24 NOTE — Telephone Encounter (Signed)
Patient mychart

## 2019-03-14 ENCOUNTER — Ambulatory Visit (HOSPITAL_COMMUNITY): Admission: RE | Admit: 2019-03-14 | Payer: Medicare Other | Source: Ambulatory Visit

## 2019-03-22 DIAGNOSIS — F3112 Bipolar disorder, current episode manic without psychotic features, moderate: Secondary | ICD-10-CM | POA: Diagnosis not present

## 2019-03-24 ENCOUNTER — Ambulatory Visit: Payer: Medicare Other | Admitting: Internal Medicine

## 2019-03-28 ENCOUNTER — Ambulatory Visit: Payer: Medicare Other | Admitting: Internal Medicine

## 2019-04-13 ENCOUNTER — Telehealth: Payer: Self-pay | Admitting: Internal Medicine

## 2019-04-13 DIAGNOSIS — J439 Emphysema, unspecified: Secondary | ICD-10-CM

## 2019-04-13 DIAGNOSIS — F17200 Nicotine dependence, unspecified, uncomplicated: Secondary | ICD-10-CM

## 2019-04-13 NOTE — Telephone Encounter (Signed)
"  Do HRCT first - indication: smoking ( reports that she has been smoking cigarettes. She has a 58.00 pack-year smoking history. She has never used smokeless tobacco. ) and RB-ILD"  Found above information from phone note from August 2020. CT was never ordered. Will place order.   Left detailed message for patient to call back.

## 2019-04-14 ENCOUNTER — Other Ambulatory Visit: Payer: Self-pay | Admitting: Surgery

## 2019-04-14 DIAGNOSIS — M6020 Foreign body granuloma of soft tissue, not elsewhere classified, unspecified site: Secondary | ICD-10-CM | POA: Diagnosis not present

## 2019-04-14 DIAGNOSIS — N6011 Diffuse cystic mastopathy of right breast: Secondary | ICD-10-CM

## 2019-04-14 DIAGNOSIS — T847XXA Infection and inflammatory reaction due to other internal orthopedic prosthetic devices, implants and grafts, initial encounter: Secondary | ICD-10-CM | POA: Diagnosis not present

## 2019-04-15 ENCOUNTER — Other Ambulatory Visit: Payer: Self-pay | Admitting: Surgery

## 2019-04-15 DIAGNOSIS — N6011 Diffuse cystic mastopathy of right breast: Secondary | ICD-10-CM

## 2019-04-18 NOTE — Telephone Encounter (Signed)
LMTCB x2 for pt 

## 2019-04-19 DIAGNOSIS — F3112 Bipolar disorder, current episode manic without psychotic features, moderate: Secondary | ICD-10-CM | POA: Diagnosis not present

## 2019-04-19 DIAGNOSIS — F4312 Post-traumatic stress disorder, chronic: Secondary | ICD-10-CM | POA: Diagnosis not present

## 2019-04-19 NOTE — Telephone Encounter (Signed)
LMTCB x3 for pt. We have attempted to contact pt several times with no success or call back from pt. Per triage protocol, message will be closed.   

## 2019-04-20 ENCOUNTER — Telehealth: Payer: Self-pay | Admitting: Internal Medicine

## 2019-04-20 NOTE — Telephone Encounter (Signed)
Spoke with pt, she is wanting to schedule her CT scan and an appointment with MR. After the scan. Can we call and set this up for her? She states she missed our call but the number to reach her is below.    Cell [phone]

## 2019-04-20 NOTE — Telephone Encounter (Signed)
[facility] tried to reach patient on 1/8 to schedule the CT, but was not able to reach the patient & stated that pt's VM greeting gets cut off.  I was able to get scheduled for 1/22 @ 2:40, check in by 2:20, no prep.  [place] [address]  Left detailed message on pt's VM & asked her to call me back so we can schedule w/ MR.

## 2019-04-20 NOTE — Telephone Encounter (Signed)
Pt sched w/ Dr Chase Caller on 2/4 @ 10:30 pt also states that she has a court date coming up and due to her asthma & COPD she does not want to go and is requesting a note for this.[name]

## 2019-04-20 NOTE — Telephone Encounter (Signed)
Pt returning call to set up appt for CT she can be reached @ [phone].Catherine Olsen

## 2019-04-20 NOTE — Telephone Encounter (Signed)
Dr. Chase Caller,   I called the patient back and she stated although a court date has not been issued yet, she is concerned about that fact that they are so close to each other and there is not any social distancing being practiced.   Patient last seen by you 02/19/2017. However, she does have an appointment to see you on 05/12/19. Based on this, do you feel it is best to issue the letter after an evaluation in clinic considering how long it has been since seen by you?  Based on what the patient stated during the call there has not been an assigned court date.

## 2019-04-20 NOTE — Telephone Encounter (Signed)
Confirmed upcoming appts w/ the patient.  However, pt is still requesting a note for court.  Pt is terrified of going d/t asthma/COPD.  Please advise.

## 2019-04-26 NOTE — Telephone Encounter (Signed)
LMTCB x1 for pt.  

## 2019-04-26 NOTE — Telephone Encounter (Signed)
Probably best that I see her in the February 2021 visit and then I can discuss with her about the court letter

## 2019-04-28 NOTE — Telephone Encounter (Signed)
Called pt and advised message from the provider. Pt understood and verbalized understanding. Nothing further is needed.    

## 2019-04-29 ENCOUNTER — Ambulatory Visit
Admission: RE | Admit: 2019-04-29 | Discharge: 2019-04-29 | Disposition: A | Discharge status: Home / Self Care | Payer: Medicare Other | Source: Ambulatory Visit | Attending: Internal Medicine | Admitting: Internal Medicine

## 2019-04-29 DIAGNOSIS — F17200 Nicotine dependence, unspecified, uncomplicated: Secondary | ICD-10-CM

## 2019-04-29 DIAGNOSIS — J432 Centrilobular emphysema: Secondary | ICD-10-CM | POA: Diagnosis not present

## 2019-04-29 DIAGNOSIS — J439 Emphysema, unspecified: Secondary | ICD-10-CM

## 2019-05-04 ENCOUNTER — Ambulatory Visit
Admission: RE | Admit: 2019-05-04 | Discharge: 2019-05-04 | Disposition: A | Discharge status: Home / Self Care | Payer: Medicare Other | Source: Ambulatory Visit | Attending: Surgery | Admitting: Surgery

## 2019-05-04 ENCOUNTER — Other Ambulatory Visit: Payer: Self-pay

## 2019-05-04 ENCOUNTER — Other Ambulatory Visit: Payer: Self-pay | Admitting: Surgery

## 2019-05-04 DIAGNOSIS — N644 Mastodynia: Secondary | ICD-10-CM | POA: Diagnosis not present

## 2019-05-04 DIAGNOSIS — N6489 Other specified disorders of breast: Secondary | ICD-10-CM

## 2019-05-04 DIAGNOSIS — N6012 Diffuse cystic mastopathy of left breast: Secondary | ICD-10-CM

## 2019-05-04 DIAGNOSIS — R922 Inconclusive mammogram: Secondary | ICD-10-CM | POA: Diagnosis not present

## 2019-05-04 DIAGNOSIS — N6011 Diffuse cystic mastopathy of right breast: Secondary | ICD-10-CM

## 2019-05-12 ENCOUNTER — Encounter: Payer: Self-pay | Admitting: General Surgery

## 2019-05-12 ENCOUNTER — Other Ambulatory Visit: Payer: Self-pay

## 2019-05-12 ENCOUNTER — Ambulatory Visit (INDEPENDENT_AMBULATORY_CARE_PROVIDER_SITE_OTHER): Payer: Medicare Other | Admitting: Internal Medicine

## 2019-05-12 ENCOUNTER — Encounter: Payer: Self-pay | Admitting: Internal Medicine

## 2019-05-12 VITALS — BP 118/74 | HR 80 | Temp 98.3°F | Ht 68.0 in | Wt 139.0 lb

## 2019-05-12 DIAGNOSIS — Z8349 Family history of other endocrine, nutritional and metabolic diseases: Secondary | ICD-10-CM | POA: Diagnosis not present

## 2019-05-12 DIAGNOSIS — J84115 Respiratory bronchiolitis interstitial lung disease: Secondary | ICD-10-CM

## 2019-05-12 DIAGNOSIS — J439 Emphysema, unspecified: Secondary | ICD-10-CM | POA: Diagnosis not present

## 2019-05-12 DIAGNOSIS — F17200 Nicotine dependence, unspecified, uncomplicated: Secondary | ICD-10-CM | POA: Diagnosis not present

## 2019-05-12 DIAGNOSIS — Y92240 Courthouse as the place of occurrence of the external cause: Secondary | ICD-10-CM

## 2019-05-12 MED ORDER — ALBUTEROL SULFATE HFA 108 (90 BASE) MCG/ACT IN AERS: 2.0000 | INHALATION_SPRAY | Freq: Four times a day (QID) | RESPIRATORY_TRACT | 12 refills | Status: DC | PRN

## 2019-05-12 NOTE — Progress Notes (Signed)
PCP Wardell Honour, MD   IOV 12/09/2016  Chief Complaint  Patient presents with  . Advice Only    Pt referred by PCP Dr. Tamala Julian for alpha 1. Pt has history of asthma, has occassional wheezing with exersion. Pt is walking 3-5 miles daily.     49 year old female. Here at the request of her brother Catherine Olsen who apparently is my patient. She tells me that she is a lifelong smoker but she has recently tried to cut down. At baseline she does not have much of respiratory complaints except for the COPD cat score of 2 as documented below. This is stable. She's been trying to cut down her smoking. In January 2018 she started having some neck complaints and had a CT scan of the neck but then reported a 4 mm left upper lobe nodule. Her brother then requested that she come and see me. There is also strong family history of alpha 1 antitrypsin deficiency and is another reason why she is here. She is very nervous and anxious about getting test. Initially she declined to have all the test. Later she told me that she will have them. Then later she told me that she has to be out of the office within the next 10-15 minutes and will reschedule them. There is no weight loss or hemoptysis or cough or wheezing    OV 02/19/2017  Chief Complaint  Patient presents with  . Follow-up    feeling well, walks 5 miles a day, trying to quit smoking with nicotine gum. not feeling SOB   49 year old female here for follow-up of multiple issues  #Smoking: She continues to smoke.  She says she will quit on her own.  She does not want any help.  She knows that she needs to quit  #Family history of alpha-1: She thinks her brother has MZ phenotype. .  She says she does not take any alcohol and she does not have any biologic kids.  She also says she plans to quit smoking.  She therefore think she is not at risk for cirrhosis.  She does not therefore want to do alpha-1 antitrypsin genetic testing.  She also says that the  anxiety about this is not something she wants to deal with.  #History of asthma: She tells me this time that she has a history of asthma although she is no wheezing and has got good effort tolerance.  We did pulmonary function test that the spirometry does show prebronchodilator FEV1 at 79% in response to 89% which is a 12% response with bronchodilator consistent with features of asthma.  The CT scan does show some emphysema and associated with this the DLCO is low.  Therefore she probably has mixed disease  #History of lung nodule: The CT scan of the chest done later shows some micronodules in the upper lobes.  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  Patient herself does not have any cough.  She is not interested in follow-up CT scans because she believes the risk for cancer is low.     CAT COPD Symptom & Quality of Life Score (GS[employer] trademark) 0 is no burden. 5 is highest burden 12/09/2016   Never Cough -> Cough all the time 0  No phlegm in chest -> Chest is full of phlegm 1  No chest tightness -> Chest feels very tight 0  No dyspnea for 1 flight stairs/hill -> Very dyspneic for 1 flight of stairs 1  No limitations for ADL at home ->  Very limited with ADL at home 0  Confident leaving home -> Not at all confident leaving home 0  Sleep soundly -> Do not sleep soundly because of lung condition 0  Lots of Energy -> No energy at all 0  TOTAL Score (max 40)  2    Walking desaturation test 185 feet 3 laps on room air: Final pulse ox was 96% with a final heart rate of 105/m. Did not desaturate    Ct Chest Wo Contrast  Result Date: 02/16/2017 CLINICAL DATA:  Pulmonary nodule. EXAM: CT CHEST WITHOUT CONTRAST TECHNIQUE: Multidetector CT imaging of the chest was performed following the standard protocol without IV contrast. COMPARISON:  Neck CT 05/04/2016 FINDINGS: Cardiovascular: The heart size is normal. No pericardial effusion. No thoracic aortic aneurysm. Mediastinum/Nodes: No mediastinal  lymphadenopathy. No evidence for gross hilar lymphadenopathy although assessment is limited by the lack of intravenous contrast on today's study. The esophagus has normal imaging features. There is no axillary lymphadenopathy. Lungs/Pleura: As noted on the prior CT, there are innumerable centrilobular ground-glass nodules scattered throughout both lungs with an upper lung predominance. These changes are superimposed on centrilobular emphysema. 2 mm perifissural nodule noted right lung on image 88 series 3. No focal airspace consolidation. No pulmonary edema or pleural effusion. Upper Abdomen: Unremarkable. Musculoskeletal: 9 mm sclerotic focus identified in the right scapula. This finding is stable since chest x-ray of 05/29/2016 suggesting bone island. IMPRESSION: 1. Similar appearance of innumerable tiny ground-glass nodules with an upper lung predominance and centrilobular distribution. Given smoking history, imaging features are compatible with smoking related lung disease (respiratory bronchiolitis). 2. 9 mm bone island right scapula. 3.  Emphysema. [id number]) Electronically Signed   By: Misty Stanley M.D.   On: 02/17/19  OV 05/12/2019  Subjective:  Patient ID: Catherine Olsen, female , DOB: 13-Mar-1971 , age 49 y.o. , MRN: [id number] , ADDRESS: [place] [id number]   05/12/2019 -   Chief Complaint  Patient presents with  . Follow-up    Discuss results of HRCT     HPI CAPRESHA BECKEY 49 y.o. -returns for follow-up.  Last seen in 2018.  She has a new issue and that she has a court date coming up.  This is because she says she is being falsely accused of assaulting her mother.  She wants a letter that she should not go to court.  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  Overall she is stable and her symptom score since she saw me in 2018.  She continues to smoke.  She does not want alpha-1  antitrypsin genetic test even though she thinks her brother has MZ phenotype.  She is anxious trying to quit smoking but she is struggling because of her anxiety.  She is asking for homeopathic medicines from [place] of which I have no knowledge of.  The respiratory bronchiolitis ILD features on her CT scan are new.  In addition she also tells me she has a new problem and that she is having a breast cancer diagnosis work-up.  She is not yet confirmed to have breast cancer.  Her biopsy is pending according to her history.  She does not want any other inhalers.  She only wants a Proventil refill for 1 year.      CT chest HRCT 04/29/2019 personally visualized and interpreted and agree with the below.  IMPRESSION: 1. The appearance  of the lungs suggests a smoking related disease such as respiratory bronchiolitis (RB), or if the patient is symptomatic, respiratory bronchiolitis interstitial lung disease (RB-ILD), as detailed above. 2. Mild diffuse bronchial wall thickening with mild centrilobular emphysema.  Emphysema (ICD10-J43.9).   Electronically Signed   By: Vinnie Langton M.D.   On: 04/29/2019 16:03  ROS - per HPI     has a past medical history of Abnormal uterine bleeding (AUB) (06/25/2009), Allergy, Anxiety, Arthritis, Asthma, Bipolar 1 disorder ([place]), COPD (chronic obstructive pulmonary disease) ([place]), Depression, Endometriosis, Fibromyalgia, Headache(784.0), Migraine, Pituitary tumor, PONV (postoperative nausea and vomiting), PTSD (post-traumatic stress disorder), Scoliosis, Termination of pregnancy, and Tobacco abuse (03/04/2015).   reports that she has been smoking cigarettes. She has a 58.00 pack-year smoking history. She has never used smokeless tobacco.  Past Surgical History:  Procedure Laterality Date  . BREAST BIOPSY Right 05/19/2007  . BREAST BIOPSY Right 06/02/2007  . BREAST SURGERY    . DILATION AND CURETTAGE OF UTERUS    . FACIAL COSMETIC SURGERY     right  cheek  . LAPAROSCOPY Right 11/11/2013   Procedure: LAPAROSCOPY OPERATIVE with  Drainage of  RIGHT Ovarian ENDOMETRIOMA;  Surgeon: Elveria Royals, MD;  Location: [facility];  Service: Gynecology;  Laterality: Right;  . NOSE SURGERY     rhinoplasty at age 65 yrs  . WISDOM TOOTH EXTRACTION      Allergies  Allergen Reactions  . Other Anaphylaxis    Idiopathic (possible binder or preservatives in medication)    . Prednisone Anaphylaxis    Steroids Steroids ( can take with benadryl )   Can not take higher than 40 mg   . Sulfamethoxazole Anaphylaxis  . Oxycodone Other (See Comments)    Severe blindness?  . Abilify [Aripiprazole]     Generic Abilify--causes severe neurological problems. Can use name brand  . Nicotine Swelling    Face swelled around implants when tried to quit smoking  . Penicillins Hives    Has patient had a PCN reaction causing immediate rash, facial/tongue/throat swelling, SOB or lightheadedness with hypotension: no Has patient had a PCN reaction causing severe rash involving mucus membranes or skin necrosis: NO Has patient had a PCN reaction that required hospitalizationNO Has patient had a PCN reaction occurring within the last 10 years:NO If all of the above answers are "NO", then may proceed with Cephalosporin use.   Marland Kitchen Percocet [Oxycodone-Acetaminophen] Palpitations  . Sulfa Antibiotics Hives    Pt states she is not really allergic to sulfa, but to sulfites.  . Sulfites Other (See Comments)    unknown    There is no immunization history for the selected administration types on file for this patient.  Family History  Problem Relation Age of Onset  . Diabetes Mother   . Hypertension Mother   . Stroke Mother 36       CVA  . Mental illness Mother        no diagnosis; personality disorder  . Hyperlipidemia Father   . Hypertension Father   . COPD Father   . Alpha-1 antitrypsin deficiency Father   . Asthma Father   . Alpha-1 antitrypsin deficiency Brother     . Asthma Brother   . Diabetes Maternal Grandmother   . Heart disease Maternal Grandmother   . Hyperlipidemia Maternal Grandmother   . Hypertension Maternal Grandmother   . Mental illness Maternal Grandmother   . Heart disease Maternal Grandfather   . Hyperlipidemia Maternal Grandfather   . Hypertension Maternal  Grandfather   . Asthma Maternal Grandfather   . Heart disease Paternal Grandfather   . Hyperlipidemia Paternal Grandfather   . Hypertension Paternal Grandfather   . Stroke Paternal Grandfather   . Alzheimer's disease Paternal Grandmother   . Allergic rhinitis Neg Hx   . Angioedema Neg Hx   . Eczema Neg Hx   . Urticaria Neg Hx   . Immunodeficiency Neg Hx      Current Outpatient Medications:  .  acetaminophen (TYLENOL) 500 MG tablet, Take 1,000 mg by mouth 3 (three) times daily. , Disp: , Rfl:  .  cetirizine (ZYRTEC) 10 MG tablet, Take 10 mg by mouth daily., Disp: , Rfl:  .  clonazePAM (KLONOPIN) 1 MG tablet, Take 1 mg by mouth 4 (four) times daily as needed., Disp: , Rfl:  .  EPINEPHrine 0.3 mg/0.3 mL IJ SOAJ injection, Inject 0.3 mLs into the skin once., Disp: , Rfl: 2 .  ibuprofen (ADVIL,MOTRIN) 800 MG tablet, TAKE 1 TABLET(800 MG) BY MOUTH EVERY 8 HOURS AS NEEDED (Patient taking differently: 800 mg 4 (four) times daily. ), Disp: 30 tablet, Rfl: 0 .  Multiple Vitamin (MULTIVITAMIN) capsule, Take 1 capsule by mouth 2 (two) times a week. , Disp: , Rfl:       Objective:   Vitals:   05/12/19 1041  BP: 118/74  Pulse: 80  Temp: 98.3 F (36.8 C)  SpO2: 100%  Weight: 139 lb (63 kg)  Height: 5\' 8"  (1.727 m)    Estimated body mass index is 21.13 kg/m as calculated from the following:   Height as of this encounter: 5\' 8"  (1.727 m).   Weight as of this encounter: 139 lb (63 kg).  @WEIGHTCHANGE @  [employer]   05/12/19 1041  Weight: 139 lb (63 kg)     Physical Exam  Alert and oriented x3.  Speech is normal.  Lungs are clear to auscultation no wheeze.  Heart  sounds are normal abdomen is soft no cyanosis no clubbing no edema.       Assessment:       ICD-10-CM   1. Smoker  F17.200   2. Pulmonary emphysema, unspecified emphysema type ([place])  J43.9   3. Family history of alpha 1 antitrypsin deficiency  Z83.49   4. 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 If they can avoid you going to court will be best but masking and other covid protocol procedures can risk mitigate  - will do 2 letters    Followup 1 year or sooner if needed     SIGNATURE    Dr. Brand Males, M.D., F.C.C.P,  Pulmonary and Critical Care Medicine Staff Physician, [facility] Director - Interstitial Lung Disease  Program  [place] [facility] [place], [place], [address]  Pager: [phone], If no answer or between  15:00h - 7:00h: call [phone] Telephone: [phone]  11:22 AM 05/12/2019

## 2019-05-12 NOTE — Patient Instructions (Addendum)
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 If they can avoid you going to court will be best but masking and other covid protocol procedures can risk mitigate  - will do 2 letters    Followup 1 year or sooner if needed

## 2019-05-18 ENCOUNTER — Other Ambulatory Visit: Payer: Self-pay | Admitting: Surgery

## 2019-05-18 DIAGNOSIS — N6310 Unspecified lump in the right breast, unspecified quadrant: Secondary | ICD-10-CM

## 2019-05-19 ENCOUNTER — Other Ambulatory Visit: Payer: Medicare Other

## 2019-05-19 ENCOUNTER — Other Ambulatory Visit: Payer: Self-pay | Admitting: Surgery

## 2019-05-19 ENCOUNTER — Ambulatory Visit
Admission: RE | Admit: 2019-05-19 | Discharge: 2019-05-19 | Disposition: A | Discharge status: Home / Self Care | Payer: Medicare Other | Source: Ambulatory Visit | Attending: Surgery | Admitting: Surgery

## 2019-05-19 ENCOUNTER — Other Ambulatory Visit: Payer: Self-pay

## 2019-05-19 DIAGNOSIS — N6489 Other specified disorders of breast: Secondary | ICD-10-CM

## 2019-05-19 DIAGNOSIS — N6310 Unspecified lump in the right breast, unspecified quadrant: Secondary | ICD-10-CM

## 2019-05-19 DIAGNOSIS — N63 Unspecified lump in unspecified breast: Secondary | ICD-10-CM | POA: Diagnosis not present

## 2019-06-22 ENCOUNTER — Ambulatory Visit: Admission: RE | Admit: 2019-06-22 | Payer: Medicare Other | Source: Ambulatory Visit

## 2019-06-22 ENCOUNTER — Other Ambulatory Visit: Payer: Self-pay

## 2019-06-22 ENCOUNTER — Ambulatory Visit
Admission: RE | Admit: 2019-06-22 | Discharge: 2019-06-22 | Disposition: A | Discharge status: Home / Self Care | Payer: Medicare Other | Source: Ambulatory Visit | Attending: Surgery | Admitting: Surgery

## 2019-06-22 ENCOUNTER — Other Ambulatory Visit: Payer: Self-pay | Admitting: Surgery

## 2019-06-22 DIAGNOSIS — R922 Inconclusive mammogram: Secondary | ICD-10-CM | POA: Diagnosis not present

## 2019-06-22 DIAGNOSIS — N6489 Other specified disorders of breast: Secondary | ICD-10-CM

## 2019-06-23 ENCOUNTER — Other Ambulatory Visit: Payer: Self-pay | Admitting: Surgery

## 2019-06-23 ENCOUNTER — Telehealth: Payer: Self-pay

## 2019-06-23 DIAGNOSIS — N6489 Other specified disorders of breast: Secondary | ICD-10-CM

## 2019-06-23 NOTE — Telephone Encounter (Signed)
Dr. Joneen Caraway from he [facility] called regarding this patient. She had an appointment with him and he stated that the things she was saying to him had suicidal ideations. He would like to speak with you regarding this patient. He can be reached at [phone]. Thank you.

## 2019-06-29 ENCOUNTER — Other Ambulatory Visit: Payer: Self-pay

## 2019-06-29 ENCOUNTER — Ambulatory Visit
Admission: RE | Admit: 2019-06-29 | Discharge: 2019-06-29 | Disposition: A | Discharge status: Home / Self Care | Payer: Medicare Other | Source: Ambulatory Visit | Attending: Surgery | Admitting: Surgery

## 2019-06-29 DIAGNOSIS — N6312 Unspecified lump in the right breast, upper inner quadrant: Secondary | ICD-10-CM | POA: Diagnosis not present

## 2019-06-29 DIAGNOSIS — N6489 Other specified disorders of breast: Secondary | ICD-10-CM

## 2019-06-29 DIAGNOSIS — N6011 Diffuse cystic mastopathy of right breast: Secondary | ICD-10-CM | POA: Diagnosis not present

## 2019-06-29 HISTORY — PX: BREAST BIOPSY: SHX20

## 2019-07-04 DIAGNOSIS — R928 Other abnormal and inconclusive findings on diagnostic imaging of breast: Secondary | ICD-10-CM | POA: Diagnosis not present

## 2019-07-04 DIAGNOSIS — N6012 Diffuse cystic mastopathy of left breast: Secondary | ICD-10-CM | POA: Diagnosis not present

## 2019-07-04 DIAGNOSIS — N6011 Diffuse cystic mastopathy of right breast: Secondary | ICD-10-CM | POA: Diagnosis not present

## 2019-07-06 ENCOUNTER — Other Ambulatory Visit (HOSPITAL_COMMUNITY): Payer: Self-pay | Admitting: Surgery

## 2019-07-06 DIAGNOSIS — N6310 Unspecified lump in the right breast, unspecified quadrant: Secondary | ICD-10-CM

## 2019-07-07 ENCOUNTER — Emergency Department (HOSPITAL_COMMUNITY): Payer: Medicare Other

## 2019-07-07 ENCOUNTER — Other Ambulatory Visit: Payer: Self-pay

## 2019-07-07 ENCOUNTER — Emergency Department (HOSPITAL_COMMUNITY)
Admission: EM | Admit: 2019-07-07 | Discharge: 2019-07-07 | Disposition: A | Discharge status: Home / Self Care | Payer: Medicare Other | Attending: Emergency Medicine | Admitting: Emergency Medicine

## 2019-07-07 ENCOUNTER — Encounter (HOSPITAL_COMMUNITY): Payer: Self-pay | Admitting: Emergency Medicine

## 2019-07-07 DIAGNOSIS — R1031 Right lower quadrant pain: Secondary | ICD-10-CM | POA: Diagnosis present

## 2019-07-07 DIAGNOSIS — Z98891 History of uterine scar from previous surgery: Secondary | ICD-10-CM | POA: Diagnosis not present

## 2019-07-07 DIAGNOSIS — A64 Unspecified sexually transmitted disease: Secondary | ICD-10-CM

## 2019-07-07 DIAGNOSIS — R079 Chest pain, unspecified: Secondary | ICD-10-CM | POA: Diagnosis not present

## 2019-07-07 DIAGNOSIS — R109 Unspecified abdominal pain: Secondary | ICD-10-CM

## 2019-07-07 DIAGNOSIS — F319 Bipolar disorder, unspecified: Secondary | ICD-10-CM | POA: Diagnosis not present

## 2019-07-07 DIAGNOSIS — F17210 Nicotine dependence, cigarettes, uncomplicated: Secondary | ICD-10-CM | POA: Diagnosis not present

## 2019-07-07 DIAGNOSIS — J449 Chronic obstructive pulmonary disease, unspecified: Secondary | ICD-10-CM | POA: Diagnosis not present

## 2019-07-07 DIAGNOSIS — Z79899 Other long term (current) drug therapy: Secondary | ICD-10-CM | POA: Diagnosis not present

## 2019-07-07 DIAGNOSIS — A599 Trichomoniasis, unspecified: Secondary | ICD-10-CM | POA: Diagnosis not present

## 2019-07-07 DIAGNOSIS — R0602 Shortness of breath: Secondary | ICD-10-CM | POA: Diagnosis not present

## 2019-07-07 DIAGNOSIS — R1030 Lower abdominal pain, unspecified: Secondary | ICD-10-CM | POA: Diagnosis not present

## 2019-07-07 LAB — I-STAT BETA HCG BLOOD, ED (MC, WL, AP ONLY): I-stat hCG, quantitative: 11.8 m[IU]/mL — ABNORMAL HIGH (ref <5)

## 2019-07-07 LAB — URINALYSIS, ROUTINE W REFLEX MICROSCOPIC
Bilirubin Urine: NEGATIVE
Glucose, UA: NEGATIVE mg/dL
Hgb urine dipstick: NEGATIVE
Ketones, ur: NEGATIVE mg/dL
Nitrite: NEGATIVE
Protein, ur: NEGATIVE mg/dL
Specific Gravity, Urine: 1.005 (ref 1.005–1.030)
pH: 5 (ref 5.0–8.0)

## 2019-07-07 LAB — CBC WITH DIFFERENTIAL/PLATELET
Abs Immature Granulocytes: 0.04 10*3/uL (ref 0.00–0.07)
Basophils Absolute: 0 10*3/uL (ref 0.0–0.1)
Basophils Relative: 0 %
Eosinophils Absolute: 0.2 10*3/uL (ref 0.0–0.5)
Eosinophils Relative: 2 %
HCT: 42.3 % (ref 36.0–46.0)
Hemoglobin: 13.3 g/dL (ref 12.0–15.0)
Immature Granulocytes: 0 %
Lymphocytes Relative: 7 %
Lymphs Abs: 0.9 10*3/uL (ref 0.7–4.0)
MCH: 31.7 pg (ref 26.0–34.0)
MCHC: 31.4 g/dL (ref 30.0–36.0)
MCV: 100.7 fL — ABNORMAL HIGH (ref 80.0–100.0)
Monocytes Absolute: 0.5 10*3/uL (ref 0.1–1.0)
Monocytes Relative: 4 %
Neutro Abs: 10.6 10*3/uL — ABNORMAL HIGH (ref 1.7–7.7)
Neutrophils Relative %: 87 %
Platelets: 243 10*3/uL (ref 150–400)
RBC: 4.2 MIL/uL (ref 3.87–5.11)
RDW: 13.6 % (ref 11.5–15.5)
WBC: 12.2 10*3/uL — ABNORMAL HIGH (ref 4.0–10.5)
nRBC: 0 % (ref 0.0–0.2)

## 2019-07-07 LAB — COMPREHENSIVE METABOLIC PANEL
ALT: 8 U/L (ref 0–44)
AST: 8 U/L — ABNORMAL LOW (ref 15–41)
Albumin: 3.1 g/dL — ABNORMAL LOW (ref 3.5–5.0)
Alkaline Phosphatase: 55 U/L (ref 38–126)
Anion gap: 11 (ref 5–15)
BUN: 14 mg/dL (ref 6–20)
CO2: 21 mmol/L — ABNORMAL LOW (ref 22–32)
Calcium: 9 mg/dL (ref 8.9–10.3)
Chloride: 106 mmol/L (ref 98–111)
Creatinine, Ser: 0.63 mg/dL (ref 0.44–1.00)
GFR calc Af Amer: >60 mL/min (ref >60)
GFR calc non Af Amer: >60 mL/min (ref >60)
Glucose, Bld: 96 mg/dL (ref 70–99)
Potassium: 3.8 mmol/L (ref 3.5–5.1)
Sodium: 138 mmol/L (ref 135–145)
Total Bilirubin: 0.6 mg/dL (ref 0.3–1.2)
Total Protein: 6.2 g/dL — ABNORMAL LOW (ref 6.5–8.1)

## 2019-07-07 LAB — LACTIC ACID, PLASMA: Lactic Acid, Venous: 0.6 mmol/L (ref 0.5–1.9)

## 2019-07-07 LAB — LIPASE, BLOOD: Lipase: 24 U/L (ref 11–51)

## 2019-07-07 MED ORDER — METRONIDAZOLE 500 MG PO TABS: 500.0000 mg | ORAL_TABLET | Freq: Two times a day (BID) | ORAL | 0 refills | Status: DC

## 2019-07-07 MED ORDER — HYDROCODONE-ACETAMINOPHEN 5-325 MG PO TABS: 1.0000 | ORAL_TABLET | Freq: Four times a day (QID) | ORAL | 0 refills | Status: DC | PRN

## 2019-07-07 MED ORDER — DOXYCYCLINE HYCLATE 100 MG PO TABS
100.0000 mg | ORAL_TABLET | Freq: Once | ORAL | Status: AC
  Administered 2019-07-07: 100 mg via ORAL
  Filled 2019-07-07: qty 1

## 2019-07-07 MED ORDER — IOHEXOL 350 MG/ML SOLN
100.0000 mL | Freq: Once | INTRAVENOUS | Status: AC | PRN
  Administered 2019-07-07: 100 mL via INTRAVENOUS

## 2019-07-07 MED ORDER — SODIUM CHLORIDE 0.9 % IV SOLN: Freq: Once | INTRAVENOUS | Status: AC

## 2019-07-07 MED ORDER — HYDROCODONE-ACETAMINOPHEN 5-325 MG PO TABS
1.0000 | ORAL_TABLET | Freq: Once | ORAL | Status: AC
  Administered 2019-07-07: 1 via ORAL
  Filled 2019-07-07: qty 1

## 2019-07-07 MED ORDER — CEFTRIAXONE SODIUM 500 MG IJ SOLR
500.0000 mg | Freq: Once | INTRAMUSCULAR | Status: AC
  Administered 2019-07-07: 14:00:00 500 mg via INTRAMUSCULAR
  Filled 2019-07-07: qty 500

## 2019-07-07 MED ORDER — LIDOCAINE HCL (PF) 1 % IJ SOLN
INTRAMUSCULAR | Status: AC
  Administered 2019-07-07: 5 mL
  Filled 2019-07-07: qty 5

## 2019-07-07 MED ORDER — DEXTROSE 5 % IV SOLN
500.0000 mg | Freq: Once | INTRAVENOUS | Status: DC
  Filled 2019-07-07: qty 500

## 2019-07-07 MED ORDER — DOXYCYCLINE HYCLATE 100 MG PO CAPS: 100.0000 mg | ORAL_CAPSULE | Freq: Two times a day (BID) | ORAL | 0 refills | Status: DC

## 2019-07-07 NOTE — ED Notes (Signed)
Pt. States she has a history of endometriosis. C/o right lower quadrant pain for 5 days with no urinary symptoms. Denies any vaginal bleeding. No n/v/d.

## 2019-07-07 NOTE — ED Notes (Signed)
Pt. Stated she took Tylenol max dose prior to arrival.  Pt. Anxiety and demanding.

## 2019-07-07 NOTE — ED Triage Notes (Signed)
Pt arrives to ED with right flank and RLQ pain for over 1 week, pt states over the last 2-3- days this pain has become much worse. Pt denies any n/v/d.

## 2019-07-07 NOTE — ED Notes (Addendum)
Tech went in to answer call bell from pt and found that pt had gotten out of bed to get her coffee after nursing staff explained she was to be NPO. Pt reports this was so that she could take 0.5 mg of her clonazepam. Pt also requested that the tech not notify other medical staff. RN Selinda Eon notified following this encounter.

## 2019-07-07 NOTE — ED Notes (Signed)
Pt up to the BR and strong smell of cigarette smoke.  Pt counseled to abide by hospital rules.  C/o increased pain and message to MD. Security notified of incident.

## 2019-07-07 NOTE — ED Notes (Signed)
Off the floor to CT 

## 2019-07-07 NOTE — Discharge Instructions (Signed)
1.  Follow-up with your gynecologist for recheck within 3 to 7 days. 2.  Go to the health department for HIV and hepatitis C testing. 3.  Take doxycycline and Flagyl as prescribed. 4.  Return to the emergency department if you develop fevers, chills, worsening pain or other concerning symptoms.
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
## 2019-07-07 NOTE — ED Notes (Signed)
Returns from CT up to bathroom, coffee Canister noted on stretcher and empty.

## 2019-07-09 ENCOUNTER — Other Ambulatory Visit: Payer: Self-pay

## 2019-07-09 ENCOUNTER — Emergency Department (HOSPITAL_COMMUNITY)
Admission: EM | Admit: 2019-07-09 | Discharge: 2019-07-09 | Disposition: A | Discharge status: Home / Self Care | Payer: Medicare Other | Attending: Emergency Medicine | Admitting: Emergency Medicine

## 2019-07-09 ENCOUNTER — Encounter (HOSPITAL_COMMUNITY): Payer: Self-pay

## 2019-07-09 DIAGNOSIS — F17210 Nicotine dependence, cigarettes, uncomplicated: Secondary | ICD-10-CM | POA: Insufficient documentation

## 2019-07-09 DIAGNOSIS — J449 Chronic obstructive pulmonary disease, unspecified: Secondary | ICD-10-CM | POA: Diagnosis not present

## 2019-07-09 DIAGNOSIS — R109 Unspecified abdominal pain: Secondary | ICD-10-CM

## 2019-07-09 DIAGNOSIS — R1011 Right upper quadrant pain: Secondary | ICD-10-CM | POA: Diagnosis not present

## 2019-07-09 DIAGNOSIS — R1031 Right lower quadrant pain: Secondary | ICD-10-CM | POA: Insufficient documentation

## 2019-07-09 DIAGNOSIS — Z79899 Other long term (current) drug therapy: Secondary | ICD-10-CM | POA: Diagnosis not present

## 2019-07-09 MED ORDER — HYDROCODONE-ACETAMINOPHEN 5-325 MG PO TABS
1.0000 | ORAL_TABLET | Freq: Once | ORAL | Status: AC
  Administered 2019-07-09: 1 via ORAL
  Filled 2019-07-09: qty 1

## 2019-07-09 NOTE — ED Notes (Signed)
Patient reports she dropped her urine cup in the toilet

## 2019-07-09 NOTE — ED Notes (Signed)
Attempted to medicate patient, but patient witnessed leaving lobby outside. Belongings noted to still be in room.

## 2019-07-09 NOTE — ED Notes (Signed)
Pt is repeatedly asking staff pain medication
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
## 2019-07-09 NOTE — ED Notes (Signed)
ED Provider at bedside. 

## 2019-07-09 NOTE — ED Triage Notes (Signed)
Arrived POV from home with c/o right pelvic pain that radiates into right lower abdomen directly under ribcage. Seen and treated 2 days ago at [facility] ED, Dx with trichomonas. Patient states she feels like pain is getting worse instead of better.

## 2019-07-09 NOTE — Discharge Instructions (Signed)
Please call your primary care provider regarding today's encounter.  You may benefit from referral to pain management clinic for your pain symptoms.  You may also call the [facility] office to schedule appointment for ongoing evaluation and management of your right-sided abdominal discomfort.  Please continue taking your previously prescribed antibiotics, as directed.  You may take Tylenol and ibuprofen as needed for your symptoms of discomfort.  Please discontinue the ibuprofen should you develop any epigastric discomfort or dark black stools.    You were given narcotic and or sedative medications while in the emergency department. Do not drive. Do not use machinery or power tools. Do not sign legal documents. Do not drink alcohol. Do not take sleeping pills. Do not supervise children by yourself. Do not participate in activities that require climbing or being in high places.  Please return to the ED or seek immediate medical attention should you experience any new or worsening symptoms.

## 2019-07-09 NOTE — ED Notes (Signed)
Patient noted to have steady gait when walking to treatment room. Patient able to move all extremities without issues. Patient noted to be yelling and cursing on the phone about her "lack of care" and that medical staff "does not care about her pain."

## 2019-07-10 ENCOUNTER — Encounter: Payer: Self-pay | Admitting: Family Medicine

## 2019-07-10 ENCOUNTER — Other Ambulatory Visit: Payer: Self-pay | Admitting: Family Medicine

## 2019-07-10 DIAGNOSIS — M545 Low back pain, unspecified: Secondary | ICD-10-CM

## 2019-07-10 DIAGNOSIS — M542 Cervicalgia: Secondary | ICD-10-CM

## 2019-07-10 DIAGNOSIS — R1011 Right upper quadrant pain: Secondary | ICD-10-CM

## 2019-07-10 DIAGNOSIS — M064 Inflammatory polyarthropathy: Secondary | ICD-10-CM

## 2019-07-10 DIAGNOSIS — R519 Headache, unspecified: Secondary | ICD-10-CM

## 2019-07-10 DIAGNOSIS — G8929 Other chronic pain: Secondary | ICD-10-CM

## 2019-07-10 NOTE — Progress Notes (Signed)
Patient ID: Catherine Olsen, female   DOB: 1970-04-29, 49 y.o.   MRN: [id number]   49 yo female who is status post recent ED visit x 2 and she would like referral to pain management (this was mentioned in her ED note) and ED was to place referral to GI as well but in case referral from PCP required by her insurance, I will also place GI referral.

## 2019-07-12 ENCOUNTER — Other Ambulatory Visit (HOSPITAL_COMMUNITY): Payer: Self-pay | Admitting: Surgery

## 2019-07-12 DIAGNOSIS — N6310 Unspecified lump in the right breast, unspecified quadrant: Secondary | ICD-10-CM

## 2019-07-13 ENCOUNTER — Encounter (HOSPITAL_BASED_OUTPATIENT_CLINIC_OR_DEPARTMENT_OTHER): Payer: Self-pay | Admitting: Surgery

## 2019-07-13 ENCOUNTER — Other Ambulatory Visit: Payer: Self-pay

## 2019-07-13 DIAGNOSIS — R1031 Right lower quadrant pain: Secondary | ICD-10-CM | POA: Diagnosis not present

## 2019-07-14 ENCOUNTER — Ambulatory Visit: Payer: Medicare Other | Admitting: Nurse Practitioner

## 2019-07-18 ENCOUNTER — Other Ambulatory Visit (HOSPITAL_COMMUNITY)
Admit: 2019-07-18 | Discharge: 2019-07-18 | Disposition: A | Discharge status: Home / Self Care | Payer: Medicare Other | Source: Ambulatory Visit | Attending: Surgery | Admitting: Surgery

## 2019-07-18 DIAGNOSIS — Z01812 Encounter for preprocedural laboratory examination: Secondary | ICD-10-CM | POA: Insufficient documentation

## 2019-07-18 DIAGNOSIS — Z20822 Contact with and (suspected) exposure to covid-19: Secondary | ICD-10-CM | POA: Insufficient documentation

## 2019-07-18 LAB — SARS CORONAVIRUS 2 (TAT 6-24 HRS): SARS Coronavirus 2: NEGATIVE

## 2019-07-18 NOTE — Progress Notes (Signed)

## 2019-07-18 NOTE — Progress Notes (Signed)
Anesthesia consult per Dr. Doroteo Glassman, will proceed with surgery as scheduled.
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
## 2019-07-20 ENCOUNTER — Other Ambulatory Visit: Payer: Self-pay

## 2019-07-20 ENCOUNTER — Ambulatory Visit
Admission: RE | Admit: 2019-07-20 | Discharge: 2019-07-20 | Disposition: A | Discharge status: Home / Self Care | Payer: Medicare Other | Source: Ambulatory Visit | Attending: Surgery | Admitting: Surgery

## 2019-07-20 DIAGNOSIS — N6310 Unspecified lump in the right breast, unspecified quadrant: Secondary | ICD-10-CM

## 2019-07-20 DIAGNOSIS — R928 Other abnormal and inconclusive findings on diagnostic imaging of breast: Secondary | ICD-10-CM | POA: Diagnosis not present

## 2019-07-20 NOTE — H&P (Signed)
Catherine Olsen  Location: [facility] Patient #: [id number] DOB: May 31, 1970 Single / Language: Catherine Olsen / Race: White Female  History of Present Illness   The patient is a 49 year old female who presents with a complaint of breast follow up.  The PCP is Dr. Miki Olsen  She comes by herself. She is a former patient of Dr. Excell Olsen.  [The Covid-19 virus has disrupted normal medical care in [place] and across the nation. We have sometimes had to alter normal surgical/medical care to limit this epidemic and we have explained these changes to the patient.]  She underwent a right breast biopsy on 06/29/2019 ([id number]) for architectural distortion which showed PASH. This was felt to be discordant and an excisional biopsy was recommended. We had a discussion that went over 40 minutes. She asks endless questions and cannot make a decision. She understands what discordant means. She is afraid of the size of the biopsy. She is complaining still about the core biopsy - so I expect her to complain about any open biopsy. She was "blind" after anesthesia for endometriosis in 2015. So she is afraid of general anesthesia. We went around and around in discussion. The open biopsy would be more conclusive, but she has very dense breast and I expect this will not be the last time that she has concerns on mammograms.  Plan: 1. She finally agreed to proceed with open biopsy.  Will proceed with right breast lumpectomy.  History of breast disease (Oct 2020): Her last mammograms were at [facility] on 01/14/2018 - she has bilateral breast cysts and breast density "d" She has had lumpy breasts for a long time. She's had prior breast biopsies which have been benign. She said she had a prior breast MRI. The only breast MRI that I can find the chart was August 2009 at which time she has symmetric dense parenchymal enhancement with no further breast MRI imaging  recommended. She has a great grandmother and some great aunts that have had breast cancer, but no more immediate family. She gets stressed out about lumps that she finds in her breast. It looks like she's been followed by Dr. Excell Olsen every 6 months or annually for some time. She has an anxious personality and I can see that trying to follow mass in her breast could be difficult.  Review of Systems as stated in this history (HPI) or in the review of systems. Otherwise all other 12 point ROS are negative  Past Medical History: 1. History of prior breast biopsies 2. Panic attacks 3. PTSD 4. On hormone replacement  She sees a reproductive endocrinologist - but would not give me the name - she gets progesterone q 3 months to empty her uterus 5. Smoke 1 to 2 cigarettes per day 6. Bipolar disease  Sees Catherine Olsen and therapist, Catherine Olsen 7. She had surgery for endometriosis in Aug 2016 - has been on hormone replacement since then 8. She says that she has COPD  Social History: Divorced Lives with mother. She has no children. She is on disability for bipolar disease and PTSD   Allergies (Catherine Olsen, CMA; 07/04/2019 3:50 PM) Sulfamethoxazole *SULFONAMIDES*  Anaphylaxis. OxyCODONE HCl (Abuse Deter) *ANALGESICS - OPIOID*  Abilify *ANTIPSYCHOTICS/ANTIMANIC AGENTS*  Penicillins  Hives. Percocet *ANALGESICS - OPIOID*  palpitations Sulfa Antibiotics  Hives. Sulfites  Allergies Reconciled   Medication History (Catherine Olsen, CMA; 07/04/2019 3:50 PM) EPINEPHrine (0.3MG /0.3ML Soln Auto-inj, Injection) Active. Ventolin HFA (108 (90 Base)MCG/ACT Aerosol Soln, Inhalation) Active.  Ibuprofen (800MG  Tablet, Oral) Active. Temazepam (15MG  Capsule, Oral) Active. Medications Reconciled  Vitals (Catherine Olsen CMA; 07/04/2019 3:51 PM) 07/04/2019 3:50 PM Weight: 134 lb Height: 67in Body Surface Area: 1.71 m Body Mass Index: 20.99 kg/m  Temp.: 56F   Pulse: 47 (Regular)  BP: 122/76 (Sitting, Left Arm, Standard)  Physical Exam  General: WN WF who is alert and generally healthy appearing. She is wearing a mask. HEENT: Normal. Pupils equal.  Neck: Supple. No mass. No thyroid mass.  Lymph Nodes: No supraclavicular or cervical or axillary nodes.  Lungs: Clear to auscultation and symmetric breath sounds. Heart: RRR. No murmur or rub.  Breasts: Right - Moderate sized and lumpy. Dense parenchyma. I do not feel a specific mass. She has a bandaid over the 10 o'clock position. She complained of right breast tenderness. Left - Moderate sized and lumpy. Dense parenchyma. No specific mass.   Extremities: Good strength and ROM in upper and lower extremities.   Assessment & Plan  1.  ABNORMALITY OF RIGHT BREAST ON SCREENING MAMMOGRAM (R92.8)  I spoke to Dr. Darden Olsen about Ms. Halley (07/05/2019)  He has the same problem that we have - she is very undecided about what she wants done - and she takes a lot of time to talk to.  She has called back and wants to schedule surgery  Plan:  Will plan right breast lumpectomy (seed localization)  2.  FIBROCYSTIC BREAST CHANGES, BILATERAL (N60.11)  3. Panic attacks 4. PTSD 5. On hormone replacement  She sees a reproductive endocrinologist - but would not give me the name - she gets progesterone q 3 months to empty her uterus 6. Smoke 1 to 2 cigarettes per day 7. Bipolar disease  Sees Catherine Olsen and therapist, Catherine Olsen 8. She says that she has COPD   Catherine Overall, MD, [facility] Office phone:  [phone]

## 2019-07-21 ENCOUNTER — Ambulatory Visit
Admission: RE | Admit: 2019-07-21 | Discharge: 2019-07-21 | Disposition: A | Discharge status: Home / Self Care | Payer: Medicare Other | Source: Ambulatory Visit | Attending: Surgery | Admitting: Surgery

## 2019-07-21 ENCOUNTER — Other Ambulatory Visit: Payer: Self-pay | Admitting: Anatomic Pathology & Clinical Pathology

## 2019-07-21 ENCOUNTER — Ambulatory Visit (HOSPITAL_BASED_OUTPATIENT_CLINIC_OR_DEPARTMENT_OTHER): Payer: Medicare Other | Admitting: Anesthesiology

## 2019-07-21 ENCOUNTER — Ambulatory Visit (HOSPITAL_BASED_OUTPATIENT_CLINIC_OR_DEPARTMENT_OTHER)
Admission: RE | Admit: 2019-07-21 | Discharge: 2019-07-21 | Disposition: A | Discharge status: Home / Self Care | Payer: Medicare Other | Attending: Surgery | Admitting: Surgery

## 2019-07-21 ENCOUNTER — Other Ambulatory Visit: Payer: Self-pay

## 2019-07-21 ENCOUNTER — Encounter (HOSPITAL_BASED_OUTPATIENT_CLINIC_OR_DEPARTMENT_OTHER): Payer: Self-pay | Admitting: Surgery

## 2019-07-21 ENCOUNTER — Encounter (HOSPITAL_BASED_OUTPATIENT_CLINIC_OR_DEPARTMENT_OTHER)
Admission: RE | Disposition: A | Discharge status: Home / Self Care | Payer: Self-pay | Source: Home / Self Care | Attending: Surgery

## 2019-07-21 DIAGNOSIS — N650 Deformity of reconstructed breast: Secondary | ICD-10-CM | POA: Diagnosis not present

## 2019-07-21 DIAGNOSIS — Z885 Allergy status to narcotic agent status: Secondary | ICD-10-CM | POA: Insufficient documentation

## 2019-07-21 DIAGNOSIS — M1990 Unspecified osteoarthritis, unspecified site: Secondary | ICD-10-CM | POA: Insufficient documentation

## 2019-07-21 DIAGNOSIS — Z79899 Other long term (current) drug therapy: Secondary | ICD-10-CM | POA: Diagnosis not present

## 2019-07-21 DIAGNOSIS — Z882 Allergy status to sulfonamides status: Secondary | ICD-10-CM | POA: Insufficient documentation

## 2019-07-21 DIAGNOSIS — D0581 Other specified type of carcinoma in situ of right breast: Secondary | ICD-10-CM | POA: Diagnosis not present

## 2019-07-21 DIAGNOSIS — Z79890 Hormone replacement therapy (postmenopausal): Secondary | ICD-10-CM | POA: Insufficient documentation

## 2019-07-21 DIAGNOSIS — F17210 Nicotine dependence, cigarettes, uncomplicated: Secondary | ICD-10-CM | POA: Insufficient documentation

## 2019-07-21 DIAGNOSIS — N6489 Other specified disorders of breast: Secondary | ICD-10-CM | POA: Diagnosis not present

## 2019-07-21 DIAGNOSIS — N6310 Unspecified lump in the right breast, unspecified quadrant: Secondary | ICD-10-CM

## 2019-07-21 DIAGNOSIS — N6031 Fibrosclerosis of right breast: Secondary | ICD-10-CM | POA: Diagnosis not present

## 2019-07-21 DIAGNOSIS — J449 Chronic obstructive pulmonary disease, unspecified: Secondary | ICD-10-CM | POA: Diagnosis not present

## 2019-07-21 DIAGNOSIS — Z803 Family history of malignant neoplasm of breast: Secondary | ICD-10-CM | POA: Insufficient documentation

## 2019-07-21 DIAGNOSIS — Z791 Long term (current) use of non-steroidal anti-inflammatories (NSAID): Secondary | ICD-10-CM | POA: Insufficient documentation

## 2019-07-21 DIAGNOSIS — J4520 Mild intermittent asthma, uncomplicated: Secondary | ICD-10-CM | POA: Diagnosis not present

## 2019-07-21 DIAGNOSIS — Z880 Allergy status to penicillin: Secondary | ICD-10-CM | POA: Insufficient documentation

## 2019-07-21 DIAGNOSIS — D0511 Intraductal carcinoma in situ of right breast: Secondary | ICD-10-CM | POA: Diagnosis not present

## 2019-07-21 DIAGNOSIS — K219 Gastro-esophageal reflux disease without esophagitis: Secondary | ICD-10-CM | POA: Diagnosis not present

## 2019-07-21 HISTORY — PX: BREAST LUMPECTOMY: SHX2

## 2019-07-21 HISTORY — PX: BREAST LUMPECTOMY WITH RADIOACTIVE SEED LOCALIZATION: SHX6424

## 2019-07-21 SURGERY — BREAST LUMPECTOMY WITH RADIOACTIVE SEED LOCALIZATION
Anesthesia: General | Site: Breast | Laterality: Right

## 2019-07-21 MED ORDER — FENTANYL CITRATE (PF) 100 MCG/2ML IJ SOLN
50.0000 ug | INTRAMUSCULAR | Status: DC | PRN
  Administered 2019-07-21: 08:00:00 100 ug via INTRAVENOUS

## 2019-07-21 MED ORDER — MIDAZOLAM HCL 2 MG/2ML IJ SOLN: 1.0000 mg | INTRAMUSCULAR | Status: DC | PRN

## 2019-07-21 MED ORDER — HYDROCODONE-ACETAMINOPHEN 5-325 MG PO TABS: 1.0000 | ORAL_TABLET | Freq: Four times a day (QID) | ORAL | 0 refills | Status: DC | PRN

## 2019-07-21 MED ORDER — ONDANSETRON HCL 4 MG/2ML IJ SOLN
INTRAMUSCULAR | Status: DC | PRN
  Administered 2019-07-21: 4 mg via INTRAVENOUS

## 2019-07-21 MED ORDER — PROPOFOL 500 MG/50ML IV EMUL
INTRAVENOUS | Status: AC
  Filled 2019-07-21: qty 50

## 2019-07-21 MED ORDER — MIDAZOLAM HCL 5 MG/5ML IJ SOLN
INTRAMUSCULAR | Status: DC | PRN
  Administered 2019-07-21: 2 mg via INTRAVENOUS

## 2019-07-21 MED ORDER — LIDOCAINE 2% (20 MG/ML) 5 ML SYRINGE
INTRAMUSCULAR | Status: AC
  Filled 2019-07-21: qty 5

## 2019-07-21 MED ORDER — PHENYLEPHRINE HCL (PRESSORS) 10 MG/ML IV SOLN
INTRAVENOUS | Status: DC | PRN
  Administered 2019-07-21 (×2): 80 ug via INTRAVENOUS

## 2019-07-21 MED ORDER — HYDROMORPHONE HCL 1 MG/ML IJ SOLN: 0.2500 mg | INTRAMUSCULAR | Status: DC | PRN

## 2019-07-21 MED ORDER — BUPIVACAINE HCL (PF) 0.25 % IJ SOLN
INTRAMUSCULAR | Status: DC | PRN
  Administered 2019-07-21: 20 mL

## 2019-07-21 MED ORDER — PROPOFOL 10 MG/ML IV BOLUS
INTRAVENOUS | Status: DC | PRN
  Administered 2019-07-21: 200 mg via INTRAVENOUS

## 2019-07-21 MED ORDER — ONDANSETRON HCL 4 MG/2ML IJ SOLN
INTRAMUSCULAR | Status: AC
  Filled 2019-07-21: qty 2

## 2019-07-21 MED ORDER — ACETAMINOPHEN 500 MG PO TABS: 1000.0000 mg | ORAL_TABLET | ORAL | Status: DC

## 2019-07-21 MED ORDER — MEPERIDINE HCL 25 MG/ML IJ SOLN: 6.2500 mg | INTRAMUSCULAR | Status: DC | PRN

## 2019-07-21 MED ORDER — CIPROFLOXACIN IN D5W 400 MG/200ML IV SOLN: 400.0000 mg | INTRAVENOUS | Status: DC

## 2019-07-21 MED ORDER — BUPIVACAINE HCL (PF) 0.25 % IJ SOLN
INTRAMUSCULAR | Status: AC
  Filled 2019-07-21: qty 120

## 2019-07-21 MED ORDER — MIDAZOLAM HCL 2 MG/2ML IJ SOLN
INTRAMUSCULAR | Status: AC
  Filled 2019-07-21: qty 2

## 2019-07-21 MED ORDER — HYDROCODONE-ACETAMINOPHEN 5-325 MG PO TABS
1.0000 | ORAL_TABLET | Freq: Once | ORAL | Status: AC
  Administered 2019-07-21: 1 via ORAL

## 2019-07-21 MED ORDER — LIDOCAINE HCL (CARDIAC) PF 100 MG/5ML IV SOSY
PREFILLED_SYRINGE | INTRAVENOUS | Status: DC | PRN
  Administered 2019-07-21: 60 mg via INTRAVENOUS

## 2019-07-21 MED ORDER — FENTANYL CITRATE (PF) 100 MCG/2ML IJ SOLN
INTRAMUSCULAR | Status: AC
  Filled 2019-07-21: qty 2

## 2019-07-21 MED ORDER — CEFAZOLIN SODIUM-DEXTROSE 1-4 GM/50ML-% IV SOLN
INTRAVENOUS | Status: DC | PRN
  Administered 2019-07-21: 1 g via INTRAVENOUS

## 2019-07-21 MED ORDER — HYDROCODONE-ACETAMINOPHEN 5-325 MG PO TABS
ORAL_TABLET | ORAL | Status: AC
  Filled 2019-07-21: qty 1

## 2019-07-21 MED ORDER — EPHEDRINE SULFATE 50 MG/ML IJ SOLN
INTRAMUSCULAR | Status: DC | PRN
  Administered 2019-07-21 (×4): 10 mg via INTRAVENOUS

## 2019-07-21 MED ORDER — LACTATED RINGERS IV SOLN: INTRAVENOUS | Status: DC

## 2019-07-21 MED ORDER — CHLORHEXIDINE GLUCONATE 4 % EX LIQD: 60.0000 mL | Freq: Once | CUTANEOUS | Status: DC

## 2019-07-21 SURGICAL SUPPLY — 46 items
ADH SKN CLS APL DERMABOND .7 (GAUZE/BANDAGES/DRESSINGS) ×1
APL PRP STRL LF DISP 70% ISPRP (MISCELLANEOUS) ×1
BINDER BREAST LRG (GAUZE/BANDAGES/DRESSINGS) IMPLANT
BINDER BREAST MEDIUM (GAUZE/BANDAGES/DRESSINGS) IMPLANT
BLADE SURG 15 STRL LF DISP TIS (BLADE) ×1 IMPLANT
BLADE SURG 15 STRL SS (BLADE) ×2
CANISTER SUC SOCK COL 7IN (MISCELLANEOUS) IMPLANT
CANISTER SUCT 1200ML W/VALVE (MISCELLANEOUS) ×2 IMPLANT
CHLORAPREP W/TINT 26 (MISCELLANEOUS) ×2 IMPLANT
CLIP VESOCCLUDE SM WIDE 6/CT (CLIP) IMPLANT
COVER BACK TABLE 60X90IN (DRAPES) ×2 IMPLANT
COVER MAYO STAND STRL (DRAPES) ×2 IMPLANT
COVER PROBE W GEL 5X96 (DRAPES) ×2 IMPLANT
DERMABOND ADVANCED (GAUZE/BANDAGES/DRESSINGS) ×1
DERMABOND ADVANCED .7 DNX12 (GAUZE/BANDAGES/DRESSINGS) ×1 IMPLANT
DRAPE LAPAROSCOPIC ABDOMINAL (DRAPES) ×2 IMPLANT
DRAPE UTILITY XL STRL (DRAPES) ×2 IMPLANT
DRSG PAD ABDOMINAL 8X10 ST (GAUZE/BANDAGES/DRESSINGS) IMPLANT
ELECT COATED BLADE 2.86 ST (ELECTRODE) ×2 IMPLANT
ELECT REM PT RETURN 9FT ADLT (ELECTROSURGICAL) ×2
ELECTRODE REM PT RTRN 9FT ADLT (ELECTROSURGICAL) ×1 IMPLANT
GAUZE SPONGE 4X4 12PLY STRL (GAUZE/BANDAGES/DRESSINGS) ×2 IMPLANT
GAUZE SPONGE 4X4 12PLY STRL LF (GAUZE/BANDAGES/DRESSINGS) IMPLANT
GLOVE BIO SURGEON STRL SZ 6.5 (GLOVE) ×1 IMPLANT
GLOVE SURG SYN 7.5  E (GLOVE) ×2
GLOVE SURG SYN 7.5 E (GLOVE) ×1 IMPLANT
GLOVE SURG SYN 7.5 PF PI (GLOVE) ×1 IMPLANT
GOWN STRL REUS W/ TWL LRG LVL3 (GOWN DISPOSABLE) ×1 IMPLANT
GOWN STRL REUS W/ TWL XL LVL3 (GOWN DISPOSABLE) ×1 IMPLANT
GOWN STRL REUS W/TWL LRG LVL3 (GOWN DISPOSABLE) ×2
GOWN STRL REUS W/TWL XL LVL3 (GOWN DISPOSABLE) ×2
KIT MARKER MARGIN INK (KITS) ×2 IMPLANT
NDL HYPO 25X1 1.5 SAFETY (NEEDLE) ×1 IMPLANT
NEEDLE HYPO 25X1 1.5 SAFETY (NEEDLE) ×2 IMPLANT
NS IRRIG 1000ML POUR BTL (IV SOLUTION) ×1 IMPLANT
PACK BASIN DAY SURGERY FS (CUSTOM PROCEDURE TRAY) ×2 IMPLANT
PENCIL SMOKE EVACUATOR (MISCELLANEOUS) ×2 IMPLANT
SLEEVE SCD COMPRESS KNEE MED (MISCELLANEOUS) ×2 IMPLANT
SPONGE LAP 18X18 RF (DISPOSABLE) ×2 IMPLANT
SUT MNCRL AB 4-0 PS2 18 (SUTURE) ×2 IMPLANT
SUT VICRYL 3-0 CR8 SH (SUTURE) ×2 IMPLANT
SYR CONTROL 10ML LL (SYRINGE) ×2 IMPLANT
TOWEL GREEN STERILE FF (TOWEL DISPOSABLE) ×2 IMPLANT
TRAY FAXITRON CT DISP (TRAY / TRAY PROCEDURE) ×2 IMPLANT
TUBE CONNECTING 20X1/4 (TUBING) ×2 IMPLANT
YANKAUER SUCT BULB TIP NO VENT (SUCTIONS) ×2 IMPLANT

## 2019-07-21 NOTE — Transfer of Care (Signed)
Immediate Anesthesia Transfer of Care Note  Patient: Catherine Olsen  Procedure(s) Performed: RIGHT BREAST LUMPECTOMY WITH RADIOACTIVE SEED LOCALIZATION (Right Breast)  Patient Location: PACU  Anesthesia Type:General  Level of Consciousness: sedated  Airway & Oxygen Therapy: Patient Spontanous Breathing and Patient connected to face mask oxygen  Post-op Assessment: Report given to RN and Post -op Vital signs reviewed and stable  Post vital signs: Reviewed and stable  Last Vitals:  Vitals Value Taken Time  BP 123/77 07/21/19 0837  Temp    Pulse 55 07/21/19 0840  Resp 11 07/21/19 0840  SpO2 100 % 07/21/19 0840  Vitals shown include unvalidated device data.  Last Pain:  Vitals:   07/21/19 [id number]  TempSrc: Temporal  PainSc: 8       Patients Stated Pain Goal: 6 ([id number])  Complications: No apparent anesthesia complications

## 2019-07-21 NOTE — Anesthesia Postprocedure Evaluation (Signed)
Anesthesia Post Note  Patient: Catherine Olsen  Procedure(s) Performed: RIGHT BREAST LUMPECTOMY WITH RADIOACTIVE SEED LOCALIZATION (Right Breast)     Patient location during evaluation: PACU Anesthesia Type: General Level of consciousness: awake and alert Pain management: pain level controlled Vital Signs Assessment: post-procedure vital signs reviewed and stable Respiratory status: spontaneous breathing, nonlabored ventilation and respiratory function stable Cardiovascular status: blood pressure returned to baseline and stable Postop Assessment: no apparent nausea or vomiting Anesthetic complications: no    Last Vitals:  Vitals:   07/21/19 0845 07/21/19 0937  BP: (!) 119/95 (!) 155/93  Pulse: 84 (!) 54  Resp: 17 20  Temp:  37 C  SpO2: 99% 99%    Last Pain:  Vitals:   07/21/19 0937  TempSrc: Oral  PainSc: 3                  Lynda Rainwater

## 2019-07-21 NOTE — Anesthesia Procedure Notes (Signed)
Procedure Name: LMA Insertion Date/Time: 07/21/2019 7:40 AM Performed by: Maryella Shivers, CRNA Pre-anesthesia Checklist: Patient identified, Emergency Drugs available, Suction available and Patient being monitored Patient Re-evaluated:Patient Re-evaluated prior to induction Oxygen Delivery Method: Circle system utilized Preoxygenation: Pre-oxygenation with 100% oxygen Induction Type: IV induction Ventilation: Mask ventilation without difficulty LMA: LMA inserted LMA Size: 4.0 Number of attempts: 1 Airway Equipment and Method: Bite block Placement Confirmation: positive ETCO2 Tube secured with: Tape Dental Injury: Teeth and Oropharynx as per pre-operative assessment

## 2019-07-21 NOTE — Op Note (Signed)
07/21/2019  8:30 AM  PATIENT:  Catherine Olsen DOB: 08/12/1970 MRN: [id number]  PREOP DIAGNOSIS:   RIGHT BREAST DISTORTION  POSTOP DIAGNOSIS:    Right breast distortion, 10 o'clock position   PROCEDURE:   Procedure(s):  RIGHT BREAST LUMPECTOMY WITH RADIOACTIVE SEED LOCALIZATION  SURGEON:   Alphonsa Overall, M.D.  ANESTHESIA:   General  Anesthesiologist: Lynda Rainwater, MD CRNA: Maryella Shivers, CRNA  General  EBL:  minimal  ml  DRAINS:  none   LOCAL MEDICATIONS USED:   20 cc 1/4% marcaine  SPECIMEN:   Right breast lumpectomy (6 color paint set)  COUNTS CORRECT:  YES  INDICATIONS FOR PROCEDURE:  [name] is a 49 y.o. (DOB: 06-02-1970) white female whose primary care physician is Fulp, Cammie, MD and comes for right breast lumpectomy..   She has an area of distortion in the upper outer quadrant of the right breast.  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  This was felt to be discordant.  The patient has a lot of anxiety, has trouble making decisions, but finally agreed to proceed with an open biopsy.  We have had extensive discussions.    The indications and potential complications of surgery were explained to the patient. Potential complications include, but are not limited to, bleeding, infection, the need for further surgery, and nerve injury.     She had a I131 seed placed on 07/20/2019 in her right breast at [facility].  The seed is in the 10 o'clock position of the right breast.     OPERATIVE NOTE:   The patient was taken to operating room # 3 at [facility] where she underwent a general anesthesia  supervised by Anesthesiologist: Lynda Rainwater, MD CRNA: Maryella Shivers, CRNA. Her right breast was prepped with  ChloraPrep and sterilely draped.    A time-out was held and the surgical check list was reviewed.    The seed was about at the 10 o'clock position of the right breast.   It was about 5 cm from the areola.  I used the  Neoprobe to identify the I131 seed.  I went directly over the seed to remove the area of the breast.   I excised this block of breast tissue approximately 2 cm by 2.5 cm  in diameter.  The dissection was taken down to the chest wall.  I painted the lumpectomy specimen with the 6 color paint kit and did a specimen mammogram which confirmed the mass, clip, and the seed were all in the right position in the specimen.  The specimen was sent to pathology who called back to confirm that they have the seed and the specimen.   I then irrigated the wound with saline. I infiltrated approximately 20 mL of 1/4% Marcaine in the incision.  I then closed the wound in layers using 3-0 Vicryl sutures for the deep layer. At the skin, I closed the incisions with a 4-0 Monocryl suture. The incisions were then painted with Dermabond.  She had gauze place over the wounds and placed in a breast binder.   The patient tolerated the procedure well, was transported to the recovery room in good condition. Sponge and needle count were correct at the end of the case.   Final pathology is pending.   Alphonsa Overall, MD, [facility] Surgery Pager: [phone] Office phone:  [phone]

## 2019-07-21 NOTE — Discharge Instructions (Signed)
[facility] - DISCHARGE INSTRUCTIONS TO PATIENT  Activity:  Driving - May drive when off pain meds   Lifting - No lifting more than 15 pounds for 3 days, then no limit                       Practice your Covid-19 protection:  Wear a mask, social distance, and wash your hands frequently  Wound Care:   Leave the incision dry for 2 days, then you may shower  Diet:  As tolerated  Follow up appointment:  Call Dr. Pollie Friar office [facility]) at [phone] for an appointment in 2 to 3 weeks..  Medications and dosages:  Resume your home medications.  You have a prescription for:  Vicodin  Call Dr. Lucia Gaskins or his office  [phone]) if you have:  Temperature greater than 100.4,  Persistent nausea and vomiting,  Severe uncontrolled pain,  Redness, tenderness, or signs of infection (pain, swelling, redness, odor or green/yellow discharge around the site),  Difficulty breathing, headache or visual disturbances,  Any other questions or concerns you may have after discharge.  In an emergency, call 911 or go to an Emergency Department at a nearby hospital.    Post Anesthesia Home Care Instructions  Activity: Get plenty of rest for the remainder of the day. A responsible individual must stay with you for 24 hours following the procedure.  For the next 24 hours, DO NOT: -Drive a car -Paediatric nurse -Drink alcoholic beverages -Take any medication unless instructed by your physician -Make any legal decisions or sign important papers.  Meals: Start with liquid foods such as gelatin or soup. Progress to regular foods as tolerated. Avoid greasy, spicy, heavy foods. If nausea and/or vomiting occur, drink only clear liquids until the nausea and/or vomiting subsides. Call your physician if vomiting continues.  Special Instructions/Symptoms: Your throat may feel dry or sore from the anesthesia or the breathing tube placed in your throat during surgery. If  this causes discomfort, gargle with warm salt water. The discomfort should disappear within 24 hours.  If you had a scopolamine patch placed behind your ear for the management of post- operative nausea and/or vomiting:  1. The medication in the patch is effective for 72 hours, after which it should be removed.  Wrap patch in a tissue and discard in the trash. Wash hands thoroughly with soap and water. 2. You may remove the patch earlier than 72 hours if you experience unpleasant side effects which may include dry mouth, dizziness or visual disturbances. 3. Avoid touching the patch. Wash your hands with soap and water after contact with the patch.

## 2019-07-21 NOTE — Interval H&P Note (Signed)
History and Physical Interval Note:  07/21/2019 7:30 AM  Catherine Olsen  has presented today for surgery, with the diagnosis of RIGHT BREAST MASS AND DISTORTION.  The various methods of treatment have been discussed with the patient and family.    Seed in good location.  I answered her questions, which were many.  After consideration of risks, benefits and other options for treatment, the patient has consented to  Procedure(s): RIGHT BREAST LUMPECTOMY WITH RADIOACTIVE SEED LOCALIZATION (Right) as a surgical intervention.  The patient's history has been reviewed, patient examined, no change in status, stable for surgery.  I have reviewed the patient's chart and labs.  Questions were answered to the patient's satisfaction.     Shann Medal

## 2019-07-22 ENCOUNTER — Encounter: Payer: Self-pay | Admitting: *Deleted

## 2019-07-25 ENCOUNTER — Ambulatory Visit: Payer: Medicare Other | Admitting: Nurse Practitioner

## 2019-07-25 LAB — SURGICAL PATHOLOGY

## 2019-07-25 NOTE — Telephone Encounter (Signed)
Dr. Chase Caller received the following email:  My surgical breast biopsy revealed a 24mm dcis. I have been having severe pain in abdominal, side up under my rib cage. I fear there is cancer elsewhere, possibly in my lungs. I had a recent abdominal and chest CT scan...about a week ago...that revealed only slightly enlarged lymphnodes everywhere. I fear there may be cancer in my right lung and abdomen. Can you review the most recent Brush CT scan, which includes my lungs, and look for any lung...issue? I wont even talk to breast surgeon for days apparently, but the constant, severe abdominal pain and weird feeling in and around my right lung along with the mild generalized lymph node swelling tells me I have problems that are not isolated to my breast. My greatgrandmother had breast cancer that spread to lung. Please respond. -Catherine Olsen  MR please advise on recent CT done in ED on 07/07/19

## 2019-07-25 NOTE — Progress Notes (Signed)
[place] NOTE  Patient Care Team: Antony Blackbird, MD as PCP - General (Family Medicine) Services, [facility] as Referring Physician Christen Butter, MD as Referring Physician (Gynecology) Melida Quitter, MD as Consulting Physician (Otolaryngology) Loretta Plume as Consulting Physician (Neurosurgery)  CHIEF COMPLAINTS/PURPOSE OF CONSULTATION:  Newly diagnosed breast cancer  HISTORY OF PRESENTING ILLNESS:  Catherine Olsen 49 y.o. female is here because of recent diagnosis of right breast ductal carcinoma in situ. Patient reported bilateral swollen axillary lymph nodes, with pain and tenderness. Diagnostic mammogram and [place] on 05/04/19 showed right breast cysts and an architectural distortion in the right breast. Mammogram on 06/22/19 showed progression of the architectural distortion in the right breast. Biopsy on 06/29/19 showed fibrocystic changes, PASH, and no evidence of malignancy. She underwent a right lumpectomy on 07/21/19 with Dr. 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 She has a family history of breast cancer in her grandmother and great aunts. She presents to the clinic today for initial evaluation and discussion of treatment options.  We spent over an hour going over her previous problems with her abdomen, endocrinology, gynecology as well as her mental health issues. She tells me that the only person she trusts is Dr. Mariea Clonts with [facility] imaging. She also tells me that Dr. Lucia Gaskins did not want her to get radiation. She is very paranoid that there are other parts of the breast that have DCIS or breast cancer and they are just undetected at this time.  I reviewed her records extensively and collaborated the history with the patient.  SUMMARY OF ONCOLOGIC HISTORY: Oncology History  Ductal carcinoma in situ (DCIS) of right breast  06/29/2019 Initial Diagnosis   Right  breast biopsy: Fibrocystic changes, pseudoangiomatous stromal hyperplasia   07/21/2019 Surgery   Right lumpectomy: Dr. Lucia Gaskins: Intermediate grade DCIS 0.2 cm, 0.1 cm from anterior margin, LCIS, ER 95%, PR 30% Tis NX stage 0     MEDICAL HISTORY:  Past Medical History:  Diagnosis Date  . Abnormal uterine bleeding (AUB) 06/25/2009   Qualifier: Diagnosis of  By: Cathren Laine MD, Ankit    . Allergy   . Anxiety   . Arthritis    hands, lower back, knees  . Asthma   . Bipolar 1 disorder ([place])   . COPD (chronic obstructive pulmonary disease) ([place])   . Depression   . Endometriosis   . Fibromyalgia   . Headache(784.0)    otc med prn  . Migraine   . Pituitary tumor    microadenoma  . PONV (postoperative nausea and vomiting)   . PTSD (post-traumatic stress disorder)   . Scoliosis   . Termination of pregnancy    x 2 at age 10 and 49 yrs old  . Tobacco abuse 03/04/2015    SURGICAL HISTORY: Past Surgical History:  Procedure Laterality Date  . BREAST BIOPSY Right 05/19/2007  . BREAST BIOPSY Right 06/02/2007  . BREAST LUMPECTOMY WITH RADIOACTIVE SEED LOCALIZATION Right 07/21/2019   Procedure: RIGHT BREAST LUMPECTOMY WITH RADIOACTIVE SEED LOCALIZATION;  Surgeon: Alphonsa Overall, MD;  Location: [facility];  Service: General;  Laterality: Right;  . BREAST SURGERY    . DILATION AND CURETTAGE OF UTERUS    . FACIAL COSMETIC SURGERY     right cheek  . LAPAROSCOPY Right 11/11/2013   Procedure: LAPAROSCOPY OPERATIVE with  Drainage of  RIGHT Ovarian ENDOMETRIOMA;  Surgeon: Elveria Royals, MD;  Location: [facility];  Service: Gynecology;  Laterality: Right;  . NOSE SURGERY     rhinoplasty at age 73 yrs  . WISDOM TOOTH EXTRACTION      SOCIAL HISTORY: Social History   Socioeconomic History  . Marital status: Divorced    Spouse name: Not on file  . Number of children: 0  . Years of education: Not on file  . Highest education level: Bachelor's degree (e.g., BA, AB, BS)  Occupational History   . Occupation: disability    Comment: mental illness  Tobacco Use  . Smoking status: Current Some Day Smoker    Packs/day: 2.00    Years: 29.00    Pack years: 58.00    Types: Cigarettes  . Smokeless tobacco: Never Used  Substance and Sexual Activity  . Alcohol use: Not Currently  . Drug use: Not Currently    Types: "Crack" cocaine    Comment: 07/19/2019  . Sexual activity: Not Currently    Comment: abortion at 81 and 70yrs. of age   Other Topics Concern  . Not on file  Social History Narrative   Marital status: divorced; not dating      Children: none      Lives: alone with mom      Employment: disability for mental illness in 1997.  Hospitalizations x 9 in past.      Tobacco: 1 ppd x since age 35. Decreasing in 2018.      Alcohol: socially; beers.      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      Exercise: yoga daily; exercise daily.      ADLs: independent with ADLs; no car; depends on others for transportation.      Patient is right-handed. She is divorced and lives with her mother. She drinks 2-3 cups of 1/2 caffeine coffe a day. She walks occasionally for exercise.   Social Determinants of Health   Financial Resource Strain:   . Difficulty of Paying Living Expenses:   Food Insecurity:   . Worried About Charity fundraiser in the Last Year:   . Arboriculturist in the Last Year:   Transportation Needs:   . Film/video editor (Medical):   Marland Kitchen Lack of Transportation (Non-Medical):   Physical Activity:   . Days of Exercise per Week:   . Minutes of Exercise per Session:   Stress:   . Feeling of Stress :   Social Connections:   . Frequency of Communication with Friends and Family:   . Frequency of Social Gatherings with Friends and Family:   . Attends Religious Services:   . Active Member of Clubs or Organizations:   . Attends Archivist Meetings:   Marland Kitchen Marital Status:   Intimate Partner Violence:   . Fear of Current or Ex-Partner:     . Emotionally Abused:   Marland Kitchen Physically Abused:   . Sexually Abused:     FAMILY HISTORY: Family History  Problem Relation Age of Onset  . Diabetes Mother   . Hypertension Mother   . Stroke Mother 64       CVA  . Mental illness Mother        no diagnosis; personality disorder  . Hyperlipidemia Father   . Hypertension Father   . COPD Father   . Alpha-1 antitrypsin deficiency Father   . Asthma Father   . Alpha-1 antitrypsin deficiency Brother   . Asthma Brother   . Diabetes Maternal Grandmother   .  Heart disease Maternal Grandmother   . Hyperlipidemia Maternal Grandmother   . Hypertension Maternal Grandmother   . Mental illness Maternal Grandmother   . Heart disease Maternal Grandfather   . Hyperlipidemia Maternal Grandfather   . Hypertension Maternal Grandfather   . Asthma Maternal Grandfather   . Heart disease Paternal Grandfather   . Hyperlipidemia Paternal Grandfather   . Hypertension Paternal Grandfather   . Stroke Paternal Grandfather   . Alzheimer's disease Paternal Grandmother   . Allergic rhinitis Neg Hx   . Angioedema Neg Hx   . Eczema Neg Hx   . Urticaria Neg Hx   . Immunodeficiency Neg Hx     ALLERGIES:  is allergic to other; prednisone; sulfamethoxazole; oxycodone; abilify [aripiprazole]; penicillins; tylenol [acetaminophen]; percocet [oxycodone-acetaminophen]; sulfa antibiotics; and sulfites.  MEDICATIONS:  Current Outpatient Medications  Medication Sig Dispense Refill  . albuterol (VENTOLIN HFA) 108 (90 Base) MCG/ACT inhaler Inhale 2 puffs into the lungs every 6 (six) hours as needed for wheezing or shortness of breath. 8 g 12  . calcium-vitamin D (OSCAL WITH D) 500-200 MG-UNIT tablet Take 1 tablet by mouth.    . cetirizine (ZYRTEC) 10 MG tablet Take 10 mg by mouth daily.    Marland Kitchen HYDROcodone-acetaminophen (NORCO/VICODIN) 5-325 MG tablet Take 1 tablet by mouth every 6 (six) hours as needed for moderate pain. 20 tablet 0  . ibuprofen (ADVIL,MOTRIN) 800 MG  tablet TAKE 1 TABLET(800 MG) BY MOUTH EVERY 8 HOURS AS NEEDED (Patient taking differently: 800 mg every 6 (six) hours as needed. ) 30 tablet 0  . magnesium oxide (MAG-OX) 400 MG tablet Take 400 mg by mouth daily.    . temazepam (RESTORIL) 15 MG capsule Take 15-45 mg by mouth at bedtime.    . clonazePAM (KLONOPIN) 1 MG tablet Take 1 mg by mouth 4 (four) times daily as needed.    Marland Kitchen EPINEPHrine 0.3 mg/0.3 mL IJ SOAJ injection Inject 0.3 mLs into the skin once.  2  . HYDROcodone-acetaminophen (NORCO/VICODIN) 5-325 MG tablet Take 1 tablet by mouth every 6 (six) hours as needed for moderate pain or severe pain. (Patient not taking: Reported on 07/26/2019) 5 tablet 0   No current facility-administered medications for this visit.    REVIEW OF SYSTEMS:   Constitutional: Denies fevers, chills or abnormal night sweats Eyes: Denies blurriness of vision, double vision or watery eyes Ears, nose, mouth, throat, and face: Denies mucositis or sore throat Respiratory: Denies cough, dyspnea or wheezes Cardiovascular: Denies palpitation, chest discomfort or lower extremity swelling Gastrointestinal: Chronic abdominal pain, seeing the gastroenterologist Skin: Denies abnormal skin rashes Lymphatics: Denies new lymphadenopathy or easy bruising Neurological:Denies numbness, tingling or new weaknesses Behavioral/Psych: Bipolar disorder with mania, paranoid personality Breast: s/p right lumpectomy, complaining of firmness in bilateral breasts and axilla. All other systems were reviewed with the patient and are negative.  PHYSICAL EXAMINATION: ECOG PERFORMANCE STATUS: 1 - Symptomatic but completely ambulatory  Vitals:   07/26/19 1547  BP: 123/76  Pulse: 80  Temp: 98.9 F (37.2 C)  SpO2: 98%   Filed Weights   07/26/19 1547  Weight: 133 lb (60.3 kg)    GENERAL:alert, no distress and comfortable SKIN: skin color, texture, turgor are normal, no rashes or significant lesions EYES: normal, conjunctiva are  pink and non-injected, sclera clear OROPHARYNX:no exudate, no erythema and lips, buccal mucosa, and tongue normal  NECK: supple, thyroid normal size, non-tender, without nodularity LYMPH:  no palpable lymphadenopathy in the cervical, axillary or inguinal LUNGS: clear to auscultation and percussion  with normal breathing effort HEART: regular rate & rhythm and no murmurs and no lower extremity edema ABDOMEN: Abdominal pain Musculoskeletal:no cyanosis of digits and no clubbing  PSYCH: Bipolar disorder with mania, paranoid NEURO: no focal motor/sensory deficits    LABORATORY DATA:  I have reviewed the data as listed Lab Results  Component Value Date   WBC 12.2 (H) 07/07/2019   HGB 13.3 07/07/2019   HCT 42.3 07/07/2019   MCV 100.7 (H) 07/07/2019   PLT 243 07/07/2019   Lab Results  Component Value Date   NA 138 07/07/2019   K 3.8 07/07/2019   CL 106 07/07/2019   CO2 21 (L) 07/07/2019    RADIOGRAPHIC STUDIES: I have personally reviewed the radiological reports and agreed with the findings in the report.  ASSESSMENT AND PLAN:  Ductal carcinoma in situ (DCIS) of right breast Right lumpectomy: Dr. 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 It is considered a precancerous lesion. DCIS is classified as a 0.  We discussed the significance of grades and its impact on prognosis. We also discussed the importance of ER and PR receptors and their implications to adjuvant treatment options. Prognosis of DCIS dependence on grade, comedo necrosis. It is anticipated that if not treated, 20-30% of DCIS can develop into invasive breast cancer.  Recommendation: 1. Adjuvant radiation therapy 2. Followed by antiestrogen therapy with anastrozole 5 years  Anastrozole counseling: We discussed the risks and benefits of anti-estrogen  therapy with aromatase inhibitors. These include but not limited to insomnia, hot flashes, mood changes, vaginal dryness, bone density loss, and weight gain. We strongly believe that the benefits far outweigh the risks. Patient understands these risks and consented to starting treatment. Planned treatment duration is 5 years.  I did not recommend tamoxifen because of her mental health issues. Bipolar disorder with mania and paranoid delusions: Patient believes that the cancer is spreading throughout the body however on multiple scans nothing was found.  She totally believes that there are other areas of DCIS or invasive breast cancer in both her breasts. At the same time she does not want to consider doing radiation therapy or take antiestrogen therapy to reduce her risk.  She thinks that the cause of her cancer is hormone replacement therapy as well as her intake of cocaine and cigarettes. She wants to change her lifestyle and therefore prevent any recurrence of breast cancer.  Plan: We will try to expedite her radiation oncology consultation so that she can hear the risks and benefits from the radiation oncologist themselves. We will obtain estradiol and FSH to confirm menopause status. We will be discussing her case in the tumor board to come up with a consensus treatment plan. It is extremely difficult to understand her and assess her because of her flights of thoughts and her multiple psychosomatic symptoms. We will try to get her in back to see her endocrinologist at [facility]. Patient truly trusts Dr. Mariea Clonts with [facility] imaging.  We will call her after the tumor board discussion.  I spent over an hour going over her multiple questions and concerns.  This includes the time it took for review of her chart and for coordination of care and charting. All questions were answered. The patient knows to call the clinic with any problems, questions or concerns.   Rulon Eisenmenger,  MD,  [facility] 07/26/2019    I, Molly Dorshimer, am acting as scribe for Nicholas Lose, MD.  I have reviewed the above documentation for accuracy and completeness, and I agree with the above.

## 2019-07-26 ENCOUNTER — Inpatient Hospital Stay: Payer: Medicare Other | Attending: Hematology and Oncology | Admitting: Hematology and Oncology

## 2019-07-26 ENCOUNTER — Encounter: Payer: Self-pay | Admitting: Hematology and Oncology

## 2019-07-26 ENCOUNTER — Other Ambulatory Visit: Payer: Self-pay

## 2019-07-26 DIAGNOSIS — F319 Bipolar disorder, unspecified: Secondary | ICD-10-CM | POA: Insufficient documentation

## 2019-07-26 DIAGNOSIS — Z170 Estrogen receptor positive status [ER+]: Secondary | ICD-10-CM | POA: Insufficient documentation

## 2019-07-26 DIAGNOSIS — D0511 Intraductal carcinoma in situ of right breast: Secondary | ICD-10-CM | POA: Insufficient documentation

## 2019-07-26 NOTE — Telephone Encounter (Signed)
Reviewed radiology report. The radiologist has not reported any abnormal findings on the ct chest that can look at lung tissue  Plan  - she is welcome to do a video or tele visit with an app this week to go over complaints and see if pulmonary related

## 2019-07-26 NOTE — Assessment & Plan Note (Signed)
Right lumpectomy: Dr. 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 It is considered a precancerous lesion. DCIS is classified as a 0.  We discussed the significance of grades and its impact on prognosis. We also discussed the importance of ER and PR receptors and their implications to adjuvant treatment options. Prognosis of DCIS dependence on grade, comedo necrosis. It is anticipated that if not treated, 20-30% of DCIS can develop into invasive breast cancer.  Recommendation: 1. Adjuvant radiation therapy 2. Followed by antiestrogen therapy with tamoxifen 5 years  Tamoxifen counseling: We discussed the risks and benefits of tamoxifen. These include but not limited to insomnia, hot flashes, mood changes, vaginal dryness, and weight gain. Although rare, serious side effects including endometrial cancer, risk of blood clots were also discussed. We strongly believe that the benefits far outweigh the risks. Patient understands these risks and consented to starting treatment. Planned treatment duration is 5 years.  Return to clinic after radiation to start antiestrogen therapy.

## 2019-07-27 ENCOUNTER — Encounter: Payer: Self-pay | Admitting: Nurse Practitioner

## 2019-07-27 ENCOUNTER — Ambulatory Visit (INDEPENDENT_AMBULATORY_CARE_PROVIDER_SITE_OTHER): Payer: Medicare Other | Admitting: Nurse Practitioner

## 2019-07-27 VITALS — BP 124/72 | HR 70 | Temp 97.9°F | Ht 68.0 in | Wt 131.2 lb

## 2019-07-27 DIAGNOSIS — R1011 Right upper quadrant pain: Secondary | ICD-10-CM | POA: Diagnosis not present

## 2019-07-27 NOTE — Patient Instructions (Addendum)
If you are age 48 or older, your body mass index should be between 23-30. Your Body mass index is 19.96 kg/m. If this is out of the aforementioned range listed, please consider follow up with your Primary Care Provider.  If you are age 40 or younger, your body mass index should be between 19-25. Your Body mass index is 19.96 kg/m. If this is out of the aformentioned range listed, please consider follow up with your Primary Care Provider.   You have been scheduled for an abdominal ultrasound at [facility] (1st floor of hospital) on 08/28/19 at 9:30 am. Please arrive 15 minutes prior to your appointment for registration. Make certain not to have anything to eat or drink starting at midnight. Should you need to reschedule your appointment, please contact radiology at [phone]. This test typically takes about 30 minutes to perform.  Follow up pending results.

## 2019-07-28 DIAGNOSIS — C50919 Malignant neoplasm of unspecified site of unspecified female breast: Secondary | ICD-10-CM | POA: Insufficient documentation

## 2019-07-28 NOTE — Progress Notes (Signed)
ASSESSMENT / PLAN:   49 yo female with pmh significant for endometriosis s/p D+C, bipolar disorder, depression, anxiety, fibromyalgia, cocaine use and newly diagnosed breast cancer in situ, and Copd.   # RUQ pain, Etiology unclear.  --started after breast biopsy --Difficult to follow patient's thoughts --She is concerned about having ovarian cancer ( mildly enlarged ovary on recent CT scan). She is concerned that breast and / or ovarian cancer are in multiple places in her body and possibly causing the RUQ pain. Other than enlarged ovary the CT scan was unremarkable. ED recommended eventual pelvic US but didn't order it which has upset her.  --Liver tests normal. Normal liver and gallbladder on CT scan. Doubt biliary disease as presentation doesn't seem consistent with that but will obtain a RUQ [place].    #Elevated hCG quant  --obtained in ED on 07/07/19 --She was deemed peri-menopausal several years ago due to pituitary microadenoma so I don't know the significance of this.    HPI:     Chief Complaint:  Right abdominal pain   Catherine Olsen is a 49 yo female new to the practice, referred by PCP for abdominal pain. She was very recently diagnosed with right breast carcinoma in situ.  She had a lumpectomy by Dr. Lucia Gaskins. She saw Oncology yesterday, recommendation was for radiation then anastrozole.   Patient has a history of a pituitary microadenoma diagnosed in 2016. Biochemical testing was consistent with peri-menopausal status. She is followed by Endocrinologist at [facility].   She has been having RUQ pain since breast biopsy.Seen in ED on 07/07/19. CT chest / abd / pelvis was done. No acute findings but her right ovary was mildly enlarged. Her urine was positive for trichomonas.  She did not want treatment. She didn't want further STD testing.   I had a hard time following patient's thoughts today. She is still having RUQ pain. It bothers her when walking but also when resting in bed.  It is not consistently related to eating. She is focused on the right ovary enlargement , concerned it is cancer.  Gives a history of an enlarged ovary which she managed to shrink down with fasting.   She wonders if the RUQ pain is from ovarian cancer or from the breast cancer. . She wonders about gallbladder disease. Says she has tried to call her Endocrinologist at [facility] about the enlarged ovary but they won't return her call now that the word cancer has entered in the picture.   Kellymarie gives a family history of alpha 1 anti-trypsin. Timmya has COPD and  wonders if she has the disease too but has always refused testing.   Past Medical History:  Diagnosis Date  . Abnormal uterine bleeding (AUB) 06/25/2009   Qualifier: Diagnosis of  By: Cathren Laine MD, Ankit    . Allergy   . Anxiety   . Arthritis    hands, lower back, knees  . Asthma   . Bipolar 1 disorder ([place])   . Breast cancer (HCC)    stage 0  . COPD (chronic obstructive pulmonary disease) ([place])   . Depression   . Endometriosis   . Fibromyalgia   . GERD (gastroesophageal reflux disease)   . Headache(784.0)    otc med prn  . Migraine   . Pituitary tumor    microadenoma  . PONV (postoperative nausea and vomiting)   . PTSD (post-traumatic stress disorder)   . Scoliosis   .  Termination of pregnancy    x 2 at age 80 and 49 yrs old  . Tobacco abuse 03/04/2015     Past Surgical History:  Procedure Laterality Date  . BREAST BIOPSY Right 05/19/2007  . BREAST BIOPSY Right 06/02/2007  . BREAST BIOPSY  06/29/2019  . BREAST LUMPECTOMY WITH RADIOACTIVE SEED LOCALIZATION Right 07/21/2019   Procedure: RIGHT BREAST LUMPECTOMY WITH RADIOACTIVE SEED LOCALIZATION;  Surgeon: Alphonsa Overall, MD;  Location: [facility];  Service: General;  Laterality: Right;  . BREAST SURGERY    . DILATION AND CURETTAGE OF UTERUS    . FACIAL COSMETIC SURGERY     right cheek  . LAPAROSCOPY Right 11/11/2013   Procedure: LAPAROSCOPY OPERATIVE with  Drainage of   RIGHT Ovarian ENDOMETRIOMA;  Surgeon: Elveria Royals, MD;  Location: [facility];  Service: Gynecology;  Laterality: Right;  . NASAL ENDOSCOPY     said it showed some acid reflux from ENT  . NOSE SURGERY     rhinoplasty at age 40 yrs  . WISDOM TOOTH EXTRACTION     Family History  Problem Relation Age of Onset  . Diabetes Mother   . Hypertension Mother   . Stroke Mother 70       CVA  . Mental illness Mother        no diagnosis; personality disorder  . Hyperlipidemia Father   . Hypertension Father   . COPD Father   . Alpha-1 antitrypsin deficiency Father   . Asthma Father   . Alpha-1 antitrypsin deficiency Brother   . Asthma Brother   . Diabetes Maternal Grandmother   . Heart disease Maternal Grandmother   . Hyperlipidemia Maternal Grandmother   . Hypertension Maternal Grandmother   . Mental illness Maternal Grandmother   . Heart disease Maternal Grandfather   . Hyperlipidemia Maternal Grandfather   . Hypertension Maternal Grandfather   . Asthma Maternal Grandfather   . Heart disease Paternal Grandfather   . Hyperlipidemia Paternal Grandfather   . Hypertension Paternal Grandfather   . Stroke Paternal Grandfather   . Alzheimer's disease Paternal Grandmother   . Allergic rhinitis Neg Hx   . Angioedema Neg Hx   . Eczema Neg Hx   . Urticaria Neg Hx   . Immunodeficiency Neg Hx   . Colon cancer Neg Hx   . Esophageal cancer Neg Hx    Social History   Tobacco Use  . Smoking status: Current Some Day Smoker    Packs/day: 2.00    Years: 29.00    Pack years: 58.00    Types: Cigarettes  . Smokeless tobacco: Never Used  Substance Use Topics  . Alcohol use: Not Currently  . Drug use: Not Currently    Types: "Crack" cocaine    Comment: 07/19/2019   Current Outpatient Medications  Medication Sig Dispense Refill  . albuterol (VENTOLIN HFA) 108 (90 Base) MCG/ACT inhaler Inhale 2 puffs into the lungs every 6 (six) hours as needed for wheezing or shortness of breath. 8 g 12  .  calcium-vitamin D (OSCAL WITH D) 500-200 MG-UNIT tablet Take 1 tablet by mouth.    . cetirizine (ZYRTEC) 10 MG tablet Take 10 mg by mouth daily.    Marland Kitchen HYDROcodone-acetaminophen (NORCO/VICODIN) 5-325 MG tablet Take 1 tablet by mouth every 6 (six) hours as needed for moderate pain. 20 tablet 0  . ibuprofen (ADVIL,MOTRIN) 800 MG tablet TAKE 1 TABLET(800 MG) BY MOUTH EVERY 8 HOURS AS NEEDED (Patient taking differently: 600-800 mg as needed. ) 30 tablet  0  . magnesium oxide (MAG-OX) 400 MG tablet Take 400 mg by mouth daily.    . temazepam (RESTORIL) 15 MG capsule Take 15-45 mg by mouth at bedtime.    . clonazePAM (KLONOPIN) 1 MG tablet Take 1 mg by mouth 4 (four) times daily as needed.    Marland Kitchen EPINEPHrine 0.3 mg/0.3 mL IJ SOAJ injection Inject 0.3 mLs into the skin once.  2   No current facility-administered medications for this visit.   Allergies  Allergen Reactions  . Other Anaphylaxis    Idiopathic (possible binder or preservatives in medication)    . Prednisone Anaphylaxis    Steroids Steroids ( can take with benadryl )   Can not take higher than 40 mg   . Sulfamethoxazole Anaphylaxis  . Oxycodone Other (See Comments)    Severe blindness?  . Abilify [Aripiprazole]     Generic Abilify--causes severe neurological problems. Can use name brand  . Penicillins Hives    Has patient had a PCN reaction causing immediate rash, facial/tongue/throat swelling, SOB or lightheadedness with hypotension: no Has patient had a PCN reaction causing severe rash involving mucus membranes or skin necrosis: NO Has patient had a PCN reaction that required hospitalizationNO Has patient had a PCN reaction occurring within the last 10 years:NO If all of the above answers are "NO", then may proceed with Cephalosporin use.   . Tylenol [Acetaminophen]     Stomach pain  . Percocet [Oxycodone-Acetaminophen]     Vision loss  . Sulfa Antibiotics Hives    Pt states she is not really allergic to sulfa, but to sulfites.   . Sulfites Other (See Comments)    unknown     Review of Systems: Positive for allergy, sinus trouble, anxiety, arthritis, back pain, breast changes, vision changes, depression, headaches, itching, muscle pain and cramps, night sweats, shortness of breath, sleeping problems swollen lymph nodes.  All other systems reviewed and negative except where noted in HPI.   Creatinine clearance cannot be calculated (Patient's most recent lab result is older than the maximum 21 days allowed.)   Physical Exam:    Wt Readings from Last 3 Encounters:  07/27/19 131 lb 4 oz (59.5 kg)  07/26/19 133 lb (60.3 kg)  07/21/19 130 lb 1.1 oz (59 kg)    BP 124/72   Pulse 70   Temp 97.9 F (36.6 C)   Ht 5\' 8"  (1.727 m)   Wt 131 lb 4 oz (59.5 kg)   LMP 11/21/2014   BMI 19.96 kg/m  Constitutional:  Well developed female in no acute distress. Psychiatric: Pressured speech. Cooperative. EENT: Pupils normal.  Conjunctivae are normal. No scleral icterus. Neck supple.  Cardiovascular: Normal rate, regular rhythm. No edema Pulmonary/chest: Effort normal and breath sounds normal. No wheezing, rales or rhonchi. Abdominal: Soft, nondistended, nontender. Bowel sounds active throughout. There are no masses palpable. No hepatomegaly. Neurological: Alert and oriented to person place and time. Skin: Skin is warm and dry. No rashes noted.  Tye Savoy, NP  07/28/2019, 7:14 PM  Cc:  Referring Provider Antony Blackbird, MD

## 2019-07-29 ENCOUNTER — Ambulatory Visit (HOSPITAL_COMMUNITY)
Admission: RE | Admit: 2019-07-29 | Discharge: 2019-07-29 | Disposition: A | Discharge status: Home / Self Care | Payer: Medicare Other | Source: Ambulatory Visit | Attending: Nurse Practitioner | Admitting: Nurse Practitioner

## 2019-07-29 ENCOUNTER — Other Ambulatory Visit: Payer: Self-pay

## 2019-07-29 ENCOUNTER — Encounter: Payer: Self-pay | Admitting: Nurse Practitioner

## 2019-07-29 ENCOUNTER — Other Ambulatory Visit: Payer: Medicare Other

## 2019-07-29 DIAGNOSIS — R1011 Right upper quadrant pain: Secondary | ICD-10-CM

## 2019-07-29 NOTE — Progress Notes (Signed)
Attending 73 Attestation   I have taken an interval history, reviewed the chart and examined the patient.   Agree with beginning workup with RUQUS. PCP likely needs to discern hCG testing. Hold on endoscopic evaluation for a bit but may consider at some point.  I agree with the Advanced Practitioner's note, impression, and recommendations with updates and my documentation above.   Justice Britain, MD [facility] Advanced Endoscopy Office # [phone]

## 2019-08-01 ENCOUNTER — Encounter: Payer: Self-pay | Admitting: Hematology and Oncology

## 2019-08-01 DIAGNOSIS — E237 Disorder of pituitary gland, unspecified: Secondary | ICD-10-CM | POA: Diagnosis not present

## 2019-08-01 DIAGNOSIS — Z3141 Encounter for fertility testing: Secondary | ICD-10-CM | POA: Diagnosis not present

## 2019-08-02 NOTE — Progress Notes (Addendum)
Location of Breast Cancer: Ductal carcinoma in situ of RIGHT breast  Histology per Pathology Report:  06/29/2019 Breast, right, needle core biopsy, superior - FIBROCYSTIC CHANGES. - PSEUDOANGIOMATOUS STROMAL HYPERPLASIA ([place]). - NO EVIDENCE OF MALIGNANCY. 07/21/2019 FINAL MICROSCOPIC DIAGNOSIS:  A. BREAST, RIGHT, LUMPECTOMY:  - Ductal carcinoma in situ, intermediate grade, spanning 0.2 cm.  - Ductal carcinoma in situ is focally 0.1 cm to the anterior margin.  - Lobular neoplasia (lobular carcinoma in situ).  Pathologic Stage Classification (pTNM, AJCC 8th Edition): pTis, pNX  Comments: In addition to the above, there are fibrocystic changes and a radial scar.  Receptor Status: ER(95%), PR (30%)  Did patient present with symptoms (if so, please note symptoms) or was this found on screening mammography?:  Patient reported bilateral swollen axillary lymph nodes, with pain and tenderness. Diagnostic mammogram and [place] on 05/04/2019 showed right breast cysts and an architectural distortion in the right breast. Mammogram on 06/22/2019 showed progression of the architectural distortion in the right breast.  Past/Anticipated interventions by surgeon, if any: 07/21/2019 Dr. Alphonsa Overall RIGHT BREAST LUMPECTOMY WITH RADIOACTIVE SEED LOCALIZATION  Past/Anticipated interventions by medical oncology, if any:  Under care of Dr. Nicholas Lose: 07/26/2019 Recommendation: 1. Adjuvant radiation therapy 2. Followed by antiestrogen therapy with tamoxifen 5 years  Lymphedema issues, if any:  No    Pain issues, if any:  Reports chronic low back and joint pain.    SAFETY ISSUES:  Prior radiation? Denies   Pacemaker/ICD? denies  Possible current pregnancy?denies  Is the patient on methotrexate? denies  Current Complaints / other details:  49 year old female. Single. Lives alone with her mother. Hx of mental illness.   Reports her surgical incision is well approximated without redness.  Reports bruising a surgical site has greatly decreased. Reports edema at surgical site.   Very concerned that radiation may contaminate the implant in her right cheek.   Wants the physician to be aware she is leaning toward life style changes without hormone therapy or radiation.

## 2019-08-02 NOTE — Progress Notes (Addendum)
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 female who is accompanied by no one due to COVID-19 restrictions. She underwent bilateral diagnostic mammography and left breast ultrasonography on 05/04/2019 for bilateral swollen axillary lymph nodes, greater on the right, with associated pain and tenderness. Results showed architectural distortion in the superior aspect of the right breast with no sonographic correlate. There was no evidence of malignancy in the left breast. Bilateral axillary nodes appeared normal.  The patient underwent a right breast ultrasound on 05/19/2019 for new small palpable mass in the right axilla. Results showed multiple small, benign right breast cysts. There was no evidence of malignancy nor was there a visible right axillary mass. The patient refused a biopsy at that time and requested short-term follow-up.  The patient underwent an additional unilateral diagnostic mammogram on 06/22/2019 as follow-up for the architectural distortion in the superior right breast. Results showed progressive architectural distortion in the superior aspect of the right breast with increasing suspicion for malignancy.  Needle core biopsy of right superior breast on 06/29/2019 revealed fibrocystic changes and pseudoangiomatous stromal hyperplasia (PASH) without evidence of malignancy.   Of note, the patient was seen in the ED on 07/07/2019 with complaints of abdominal pain and chest pain. CTA of chest and CT of abdomen/pelvis were unremarkable with the exception of a mildly  enlarged right ovary.   The patient proceeded with right breast lumpectomy on 07/21/2019 performed by Dr. Lucia Gaskins. Pathology from the procedure revealed intermediate grade ductal carcinoma in situ ( 2 mm focus), focally 0.1 cm to the anterior margin, in addition to lobular neoplasia (lobular carcinoma in situ). ER positive at 95%, PR positive at 30%.  The patient was seen by Dr. Lindi Adie on 07/26/2019, during which time he recommended adjuvant radiation therapy followed by antiestrogen therapy with Anastrozole x5 years. He did not recommend Tamoxifen because of her mental health issues (bipolar disorder with mania and paranoid delusions).  The patient underwent an abdominal ultrasound on 07/29/2019 secondary to right upper quadrant abdominal pain. Results were negative.  PREVIOUS RADIATION THERAPY: No  PAST MEDICAL HISTORY:  Past Medical History:  Diagnosis Date  . Abnormal uterine bleeding (AUB) 06/25/2009   Qualifier: Diagnosis of  By: Cathren Laine MD, Ankit    . Allergy   . Anxiety   . Arthritis    hands, lower back, knees  . Asthma   . Bipolar 1 disorder ([place])   . Breast cancer (HCC)    stage 0  . COPD (chronic obstructive pulmonary disease) ([place])   . Depression   . Endometriosis   . Fibromyalgia   . GERD (gastroesophageal reflux disease)   . Headache(784.0)    otc med prn  . Migraine   . Pituitary tumor    microadenoma  . PONV (postoperative nausea and vomiting)   . PTSD (post-traumatic stress disorder)   . Scoliosis   . Termination of pregnancy    x 2 at age 70 and 49 yrs old  . Tobacco abuse 03/04/2015    PAST SURGICAL HISTORY: Past Surgical History:  Procedure Laterality  Date  . BREAST BIOPSY Right 05/19/2007  . BREAST BIOPSY Right 06/02/2007  . BREAST BIOPSY  06/29/2019  . BREAST LUMPECTOMY WITH RADIOACTIVE SEED LOCALIZATION Right 07/21/2019   Procedure: RIGHT BREAST LUMPECTOMY WITH RADIOACTIVE SEED LOCALIZATION;  Surgeon: Alphonsa Overall, MD;  Location: [facility];  Service: General;  Laterality: Right;  . BREAST SURGERY    . DILATION AND CURETTAGE OF UTERUS    . FACIAL COSMETIC SURGERY     right cheek  . LAPAROSCOPY Right 11/11/2013   Procedure: LAPAROSCOPY OPERATIVE with  Drainage of  RIGHT Ovarian ENDOMETRIOMA;  Surgeon: Elveria Royals, MD;  Location: [facility];  Service: Gynecology;  Laterality: Right;  . NASAL ENDOSCOPY     said it showed some acid reflux from ENT  . NOSE SURGERY     rhinoplasty at age 49 yrs  . WISDOM TOOTH EXTRACTION      FAMILY HISTORY:  Family History  Problem Relation Age of Onset  . Diabetes Mother   . Hypertension Mother   . Stroke Mother 51       CVA  . Mental illness Mother        no diagnosis; personality disorder  . Hyperlipidemia Father   . Hypertension Father   . COPD Father   . Alpha-1 antitrypsin deficiency Father   . Asthma Father   . Alpha-1 antitrypsin deficiency Brother   . Asthma Brother   . Diabetes Maternal Grandmother   . Heart disease Maternal Grandmother   . Hyperlipidemia Maternal Grandmother   . Hypertension Maternal Grandmother   . Mental illness Maternal Grandmother   . Heart disease Maternal Grandfather   . Hyperlipidemia Maternal Grandfather   . Hypertension Maternal Grandfather   . Asthma Maternal Grandfather   . Heart disease Paternal Grandfather   . Hyperlipidemia Paternal Grandfather   . Hypertension Paternal Grandfather   . Stroke Paternal Grandfather   . Alzheimer's disease Paternal Grandmother   . Allergic rhinitis Neg Hx   . Angioedema Neg Hx   . Eczema Neg Hx   . Urticaria Neg Hx   . Immunodeficiency Neg Hx   . Colon cancer Neg Hx   . Esophageal cancer Neg Hx     SOCIAL HISTORY:  Social History   Tobacco Use  . Smoking status: Current Some Day Smoker    Packs/day: 2.00    Years: 29.00    Pack years: 58.00    Types: Cigarettes  . Smokeless tobacco: Never Used  Substance Use Topics  . Alcohol use: Not Currently  . Drug use: Not Currently    Types:  "Crack" cocaine    Comment: 07/19/2019    ALLERGIES:  Allergies  Allergen Reactions  . Other Anaphylaxis    Idiopathic (possible binder or preservatives in medication)    . Prednisone Anaphylaxis    Steroids Steroids ( can take with benadryl )   Can not take higher than 40 mg   . Sulfamethoxazole Anaphylaxis  . Oxycodone Other (See Comments)    Severe blindness?  . Abilify [Aripiprazole]     Generic Abilify--causes severe neurological problems. Can use name brand  . Celebrex [Celecoxib]   . Penicillins Hives    Has patient had a PCN reaction causing immediate rash, facial/tongue/throat swelling, SOB or lightheadedness with hypotension: no Has patient had a PCN reaction causing severe rash involving mucus membranes or skin necrosis: NO Has patient had a PCN reaction that required hospitalizationNO Has patient had a PCN reaction occurring within the last 10  years:NO If all of the above answers are "NO", then may proceed with Cephalosporin use.   . Tylenol [Acetaminophen]     Stomach pain  . Percocet [Oxycodone-Acetaminophen]     Vision loss  . Sulfa Antibiotics Hives    Pt states she is not really allergic to sulfa, but to sulfites.  . Sulfites Other (See Comments)    unknown    MEDICATIONS:  Current Outpatient Medications  Medication Sig Dispense Refill  . albuterol (VENTOLIN HFA) 108 (90 Base) MCG/ACT inhaler Inhale 2 puffs into the lungs every 6 (six) hours as needed for wheezing or shortness of breath. 8 g 12  . Ascorbic Acid (VITAMIN C) 1000 MG tablet Take 1,000 mg by mouth daily.    . calcium-vitamin D (OSCAL WITH D) 500-200 MG-UNIT tablet Take 1 tablet by mouth.    . cetirizine (ZYRTEC) 10 MG tablet Take 10 mg by mouth daily.    Marland Kitchen ibuprofen (ADVIL,MOTRIN) 800 MG tablet TAKE 1 TABLET(800 MG) BY MOUTH EVERY 8 HOURS AS NEEDED (Patient taking differently: 600-800 mg as needed. ) 30 tablet 0  . nicotine (NICODERM CQ - DOSED IN MG/24 HOURS) 21 mg/24hr patch 21 mg daily.     . temazepam (RESTORIL) 15 MG capsule Take 15-45 mg by mouth at bedtime.    . clonazePAM (KLONOPIN) 1 MG tablet Take 1 mg by mouth 4 (four) times daily as needed.    Marland Kitchen EPINEPHrine 0.3 mg/0.3 mL IJ SOAJ injection Inject 0.3 mLs into the skin once.  2  . HYDROcodone-acetaminophen (NORCO/VICODIN) 5-325 MG tablet Take 1 tablet by mouth every 6 (six) hours as needed for moderate pain. (Patient not taking: Reported on 08/03/2019) 20 tablet 0  . magnesium oxide (MAG-OX) 400 MG tablet Take 400 mg by mouth daily.     No current facility-administered medications for this encounter.    REVIEW OF SYSTEMS:  A 10+ POINT REVIEW OF SYSTEMS WAS OBTAINED including neurology, dermatology, psychiatry, cardiac, respiratory, lymph, extremities, GI, GU, musculoskeletal, constitutional, reproductive, HEENT.  Patient reports some soreness within the breast but no significant pain.  She denies any nipple discharge or bleeding   PHYSICAL EXAM:  height is '5\' 8"'$  (1.727 m) and weight is 130 lb (59 kg). Her temperature is 98.2 F (36.8 C). Her blood pressure is 115/75 and her pulse is 66. Her respiration is 20 and oxygen saturation is 100%.   General: Alert and oriented, in no acute distress HEENT: Head is normocephalic. Extraocular movements are intact.  Neck: Neck is supple, no palpable cervical or supraclavicular lymphadenopathy. Heart: Regular in rate and rhythm with no murmurs, rubs, or gallops. Chest: Clear to auscultation bilaterally, with no rhonchi, wheezes, or rales. Abdomen: Soft, nontender, nondistended, with no rigidity or guarding. Extremities: No cyanosis or edema. Lymphatics: see Neck Exam Skin: No concerning lesions. Musculoskeletal: symmetric strength and muscle tone throughout. Neurologic: Cranial nerves II through XII are grossly intact. No obvious focalities. Speech is fluent. Coordination is intact. Psychiatric: Judgment and insight are intact. Affect is appropriate. Left breast: No palpable mass,  nipple discharge, or bleeding.  Breast tissue quite firm with palpation consistent with mammographic findings Right breast: Well-healing scar in the upper outer quadrant.  No nipple discharge or bleeding.  The right breast is also quite firm with palpation throughout c/w with mammographic findings  ECOG = 1  0 - Asymptomatic (Fully active, able to carry on all predisease activities without restriction)  1 - Symptomatic but completely ambulatory (Restricted in physically strenuous activity but  ambulatory and able to carry out work of a light or sedentary nature. For example, light housework, office work)  2 - Symptomatic, <50% in bed during the day (Ambulatory and capable of all self care but unable to carry out any work activities. Up and about more than 50% of waking hours)  3 - Symptomatic, >50% in bed, but not bedbound (Capable of only limited self-care, confined to bed or chair 50% or more of waking hours)  4 - Bedbound (Completely disabled. Cannot carry on any self-care. Totally confined to bed or chair)  5 - Death   Eustace Pen MM, Creech RH, Tormey DC, et al. [phone]). "Toxicity and response criteria of the [facility]". [place] Oncol. 5 (6): 649-55  LABORATORY DATA:  Lab Results  Component Value Date   WBC 12.2 (H) 07/07/2019   HGB 13.3 07/07/2019   HCT 42.3 07/07/2019   MCV 100.7 (H) 07/07/2019   PLT 243 07/07/2019   NEUTROABS 10.6 (H) 07/07/2019   Lab Results  Component Value Date   NA 138 07/07/2019   K 3.8 07/07/2019   CL 106 07/07/2019   CO2 21 (L) 07/07/2019   GLUCOSE 96 07/07/2019   CREATININE 0.63 07/07/2019   CALCIUM 9.0 07/07/2019      RADIOGRAPHY: CT Angio Chest PE W/Cm &/Or Wo Cm  Result Date: 07/07/2019 CLINICAL DATA:  Chest pain, shortness of breath and abdominal pain. EXAM: CT ANGIOGRAPHY CHEST CT ABDOMEN AND PELVIS WITH CONTRAST TECHNIQUE: Multidetector CT imaging of the chest was performed using the standard protocol during bolus  administration of intravenous contrast. Multiplanar CT image reconstructions and MIPs were obtained to evaluate the vascular anatomy. Multidetector CT imaging of the abdomen and pelvis was performed using the standard protocol during bolus administration of intravenous contrast. CONTRAST:  180m OMNIPAQUE IOHEXOL 350 MG/ML SOLN COMPARISON:  Chest CT 04/29/2019 FINDINGS: CTA CHEST FINDINGS Cardiovascular: The heart is normal in size. No pericardial effusion. The aorta is normal in caliber. No focal aneurysm. No atherosclerotic calcifications. No coronary artery calcifications. The pulmonary arterial tree is well opacified. No filling defects to suggest pulmonary embolism. Mediastinum/Nodes: Scattered mediastinal and hilar lymph nodes but no mass or overt adenopathy. The esophagus is grossly normal. Lungs/Pleura: No acute pulmonary findings. There are mild emphysematous changes noted. No worrisome pulmonary lesions. No pleural effusion. Minimal dependent subpleural atelectasis. Musculoskeletal: No breast masses, supraclavicular or axillary adenopathy. No significant bony findings. Review of the MIP images confirms the above findings. CT ABDOMEN and PELVIS FINDINGS Hepatobiliary: No focal hepatic lesions or intrahepatic biliary dilatation. The gallbladder is normal. No common bile duct dilatation. Pancreas: No mass, inflammation or ductal dilatation. Incidental pancreatic divisum. Spleen: Normal size. No focal lesions. Adrenals/Urinary Tract: The adrenal glands and kidneys are normal. The bladder is normal. Stomach/Bowel: The stomach, duodenum, small bowel and colon are grossly normal. No acute inflammatory findings, mass lesions or obstructive process. The terminal ileum is normal. Low lying cecum deep in the pelvis. The appendix is normal. Moderate stool throughout the colon and down into the rectum could suggest constipation. Vascular/Lymphatic: The aorta is normal in caliber. No dissection. The branch vessels are  patent. The major venous structures are patent. No mesenteric or retroperitoneal mass or adenopathy. Small scattered lymph nodes are noted. Reproductive: The uterus is unremarkable except for a small fibroid. The right ovary is mildly enlarged measuring 4.5 x 4.5 x 3.7 cm. No discrete lesion. With history of endometriosis pelvic ultrasound may be helpful to exclude endometrioma. The left  ovary appears normal. Other: No significant free pelvic fluid collections. Musculoskeletal: No significant bony findings. Review of the MIP images confirms the above findings. IMPRESSION: 1. No CT findings for pulmonary embolism. 2. No acute pulmonary findings. 3. Normal thoracic aorta. 4. No acute abdominal/pelvic findings, mass lesions or adenopathy. 5. Mildly enlarged right ovary. With history of endometriosis, pelvic ultrasound may be helpful to exclude endometrioma. Electronically Signed   By: Marijo Sanes M.D.   On: 07/07/2019 12:12   CT Abdomen Pelvis W Contrast  Result Date: 07/07/2019 CLINICAL DATA:  Chest pain, shortness of breath and abdominal pain. EXAM: CT ANGIOGRAPHY CHEST CT ABDOMEN AND PELVIS WITH CONTRAST TECHNIQUE: Multidetector CT imaging of the chest was performed using the standard protocol during bolus administration of intravenous contrast. Multiplanar CT image reconstructions and MIPs were obtained to evaluate the vascular anatomy. Multidetector CT imaging of the abdomen and pelvis was performed using the standard protocol during bolus administration of intravenous contrast. CONTRAST:  186m OMNIPAQUE IOHEXOL 350 MG/ML SOLN COMPARISON:  Chest CT 04/29/2019 FINDINGS: CTA CHEST FINDINGS Cardiovascular: The heart is normal in size. No pericardial effusion. The aorta is normal in caliber. No focal aneurysm. No atherosclerotic calcifications. No coronary artery calcifications. The pulmonary arterial tree is well opacified. No filling defects to suggest pulmonary embolism. Mediastinum/Nodes: Scattered  mediastinal and hilar lymph nodes but no mass or overt adenopathy. The esophagus is grossly normal. Lungs/Pleura: No acute pulmonary findings. There are mild emphysematous changes noted. No worrisome pulmonary lesions. No pleural effusion. Minimal dependent subpleural atelectasis. Musculoskeletal: No breast masses, supraclavicular or axillary adenopathy. No significant bony findings. Review of the MIP images confirms the above findings. CT ABDOMEN and PELVIS FINDINGS Hepatobiliary: No focal hepatic lesions or intrahepatic biliary dilatation. The gallbladder is normal. No common bile duct dilatation. Pancreas: No mass, inflammation or ductal dilatation. Incidental pancreatic divisum. Spleen: Normal size. No focal lesions. Adrenals/Urinary Tract: The adrenal glands and kidneys are normal. The bladder is normal. Stomach/Bowel: The stomach, duodenum, small bowel and colon are grossly normal. No acute inflammatory findings, mass lesions or obstructive process. The terminal ileum is normal. Low lying cecum deep in the pelvis. The appendix is normal. Moderate stool throughout the colon and down into the rectum could suggest constipation. Vascular/Lymphatic: The aorta is normal in caliber. No dissection. The branch vessels are patent. The major venous structures are patent. No mesenteric or retroperitoneal mass or adenopathy. Small scattered lymph nodes are noted. Reproductive: The uterus is unremarkable except for a small fibroid. The right ovary is mildly enlarged measuring 4.5 x 4.5 x 3.7 cm. No discrete lesion. With history of endometriosis pelvic ultrasound may be helpful to exclude endometrioma. The left ovary appears normal. Other: No significant free pelvic fluid collections. Musculoskeletal: No significant bony findings. Review of the MIP images confirms the above findings. IMPRESSION: 1. No CT findings for pulmonary embolism. 2. No acute pulmonary findings. 3. Normal thoracic aorta. 4. No acute abdominal/pelvic  findings, mass lesions or adenopathy. 5. Mildly enlarged right ovary. With history of endometriosis, pelvic ultrasound may be helpful to exclude endometrioma. Electronically Signed   By: PMarijo SanesM.D.   On: 07/07/2019 12:12   MM Breast Surgical Specimen  Result Date: 07/21/2019 CLINICAL DATA:  Surgical excision of an area of architectural distortion in the superior right breast with discordant benign biopsy results. EXAM: SPECIMEN RADIOGRAPH OF THE RIGHT BREAST COMPARISON:  Previous exam(s). FINDINGS: Status post excision of the right breast. The radioactive seed is present and completely intact. IMPRESSION: Specimen  radiograph of the right breast. Electronically Signed   By: Claudie Revering M.D.   On: 07/21/2019 08:15   MM RT RADIOACTIVE SEED LOC MAMMO GUIDE  Result Date: 07/20/2019 CLINICAL DATA:  Pre surgical excision localization of an area of architectural distortion in the upper right breast with discordant benign results that 3D stereotactic guided core needle biopsy. The patient refused clip placement at the time of biopsy. EXAM: MAMMOGRAPHIC GUIDED RADIOACTIVE SEED LOCALIZATION OF THE RIGHT BREAST COMPARISON:  Previous exam(s). FINDINGS: Patient presents for radioactive seed localization prior to right breast excision. I met with the patient and we discussed the procedure of seed localization including benefits and alternatives. We discussed the high likelihood of a successful procedure. We discussed the risks of the procedure including infection, bleeding, tissue injury and further surgery. We discussed the low dose of radioactivity involved in the procedure. Informed, written consent was given. The usual time-out protocol was performed immediately prior to the procedure. Using mammographic guidance, sterile technique, 1% lidocaine and an I-125 radioactive seed, the recently biopsied area of architectural distortion in the upper right breast was localized using a lateral approach. The follow-up  mammogram images confirm the seed in the expected location and were marked for Dr. Lucia Gaskins. Follow-up survey of the patient confirms presence of the radioactive seed. Order number of I-125 seed:  [id number]. Total activity:  0.246 mCi reference Date: 06/29/2019 The patient tolerated the procedure well and was released from the [facility]. She was given instructions regarding seed removal. IMPRESSION: Radioactive seed localization right breast. No apparent complications. Electronically Signed   By: Claudie Revering M.D.   On: 07/20/2019 10:34   US ABDOMEN LIMITED RUQ  Result Date: 07/29/2019 CLINICAL DATA:  49 year old female with history of right upper quadrant abdominal pain. EXAM: ULTRASOUND ABDOMEN LIMITED RIGHT UPPER QUADRANT COMPARISON:  No priors. FINDINGS: Gallbladder: No gallstones or wall thickening visualized. No sonographic Murphy sign noted by sonographer. Common bile duct: Diameter: 2 mm Liver: No focal lesion identified. Within normal limits in parenchymal echogenicity. Portal vein is patent on color Doppler imaging with normal direction of blood flow towards the liver. Other: None. IMPRESSION: 1. No acute findings. Specifically, no evidence of gallstones or findings to suggest an acute cholecystitis at this time. Electronically Signed   By: Vinnie Langton M.D.   On: 07/29/2019 14:52      IMPRESSION: Stage 0, Upper Right Breast, DCIS and LCIS, ER+ / PR+, Grade 2  I discussed in detail with the patient the pathologic findings and general recommendations for radiation therapy given her young age.  We discussed that radiation therapy would reduce the chances for recurrence within the right breast although given her pathologic findings (2 mm),  the  risk for recurrence in general  is low.  We also discussed that in general we would like to have at least a 2 mm margin in patients  avoiding radiation therapy.   Confounding the issue in terms of recommendations for radiation therapy are the patients  breast density (D -extremely dense).  With additional radiation therapy with anticipated scar tissue the ability to detect other new areas will be quite limited.  In addition the patient continues to smoke up to 1 pack of cigarettes per day and with radiation therapy to the right breast and right anterior lung area this would increase potential for lung cancer.  I discussed recommendations for smoking cessation with the patient especially if she were to proceed with radiation therapy.  She admits that this  will be very difficult for her and has tried nicotine patches in the past.  We discussed the general course of radiation therapy anticipated side effects and potential long-term toxicities of radiation therapy.  The patient appears to understand these issues well.  After careful consideration particularly as it relates to her other medical issues and relatively low risk for recurrence, the patient is leaning against proceeding with radiation therapy but has not made her final decision.  I have asked her to call me if she has any additional questions concerning the recommendation for radiation therapy.  She at this point seems more interested in pursuing adjuvant hormonal therapy and would like to meet with Dr. Lindi Adie further to discuss this issue further particularly since her blood work is available from [facility]  .  PLAN: Advised patient to call me if she has additional questions or if she changes her mind and wishes to proceed with adjuvant radiation therapy.  She plans on continuing  follow-up with periodic imaging as she has done in the past.  Her decisions concerning  adjuvant hormonal therapy pending at this time as above.    ------------------------------------------------  Blair Promise, PhD, MD  This document serves as a record of services personally performed by Gery Pray, MD. It was created on his behalf by Clerance Lav, a trained medical scribe. The creation of this record is  based on the scribe's personal observations and the provider's statements to them. This document has been checked and approved by the attending provider.

## 2019-08-03 ENCOUNTER — Encounter: Payer: Self-pay | Admitting: Radiation Oncology

## 2019-08-03 ENCOUNTER — Ambulatory Visit
Admission: RE | Admit: 2019-08-03 | Discharge: 2019-08-03 | Disposition: A | Discharge status: Home / Self Care | Payer: Medicare Other | Source: Ambulatory Visit | Attending: Radiation Oncology | Admitting: Radiation Oncology

## 2019-08-03 ENCOUNTER — Other Ambulatory Visit: Payer: Self-pay

## 2019-08-03 VITALS — BP 115/75 | HR 66 | Temp 98.2°F | Resp 20 | Ht 68.0 in | Wt 130.0 lb

## 2019-08-03 DIAGNOSIS — J449 Chronic obstructive pulmonary disease, unspecified: Secondary | ICD-10-CM | POA: Diagnosis not present

## 2019-08-03 DIAGNOSIS — F22 Delusional disorders: Secondary | ICD-10-CM | POA: Insufficient documentation

## 2019-08-03 DIAGNOSIS — K219 Gastro-esophageal reflux disease without esophagitis: Secondary | ICD-10-CM | POA: Insufficient documentation

## 2019-08-03 DIAGNOSIS — M797 Fibromyalgia: Secondary | ICD-10-CM | POA: Diagnosis not present

## 2019-08-03 DIAGNOSIS — Z170 Estrogen receptor positive status [ER+]: Secondary | ICD-10-CM | POA: Insufficient documentation

## 2019-08-03 DIAGNOSIS — D0511 Intraductal carcinoma in situ of right breast: Secondary | ICD-10-CM

## 2019-08-03 DIAGNOSIS — F17210 Nicotine dependence, cigarettes, uncomplicated: Secondary | ICD-10-CM | POA: Diagnosis not present

## 2019-08-03 DIAGNOSIS — R1011 Right upper quadrant pain: Secondary | ICD-10-CM | POA: Diagnosis not present

## 2019-08-03 DIAGNOSIS — Z9889 Other specified postprocedural states: Secondary | ICD-10-CM | POA: Diagnosis not present

## 2019-08-03 DIAGNOSIS — M419 Scoliosis, unspecified: Secondary | ICD-10-CM | POA: Diagnosis not present

## 2019-08-03 DIAGNOSIS — M129 Arthropathy, unspecified: Secondary | ICD-10-CM | POA: Diagnosis not present

## 2019-08-03 DIAGNOSIS — F319 Bipolar disorder, unspecified: Secondary | ICD-10-CM | POA: Insufficient documentation

## 2019-08-03 NOTE — Progress Notes (Signed)
See progress note under physician encounter. 

## 2019-08-09 ENCOUNTER — Inpatient Hospital Stay: Payer: Medicare Other | Admitting: Hematology and Oncology

## 2019-08-09 ENCOUNTER — Ambulatory Visit: Payer: Medicare Other | Admitting: Hematology and Oncology

## 2019-08-09 ENCOUNTER — Encounter: Payer: Self-pay | Admitting: *Deleted

## 2019-08-09 ENCOUNTER — Encounter: Payer: Self-pay | Admitting: Hematology and Oncology

## 2019-08-09 NOTE — Assessment & Plan Note (Deleted)
Right lumpectomy: Dr. 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  Adjuvant radiation therapy: Patient discussed with Dr. Dorathy Daft and agreed to radiation. 2.  I discussed with her about pros and cons of antiestrogen therapy for 5 years. The reduction risk of cancer recurrence is small and the risks are great given her frail mental status.  Therefore we decided not to use antiestrogen therapy.

## 2019-08-11 NOTE — Progress Notes (Signed)
Patient Care Team: Antony Blackbird, MD as PCP - General (Family Medicine) Services, [facility] Recovery as Referring Physician Christen Butter, MD as Referring Physician (Gynecology) Melida Quitter, MD as Consulting Physician (Otolaryngology) Loretta Plume as Consulting Physician (Neurosurgery)  DIAGNOSIS:    ICD-10-CM   1. Ductal carcinoma in situ (DCIS) of right breast  D05.11     SUMMARY OF ONCOLOGIC HISTORY: Oncology History  Ductal carcinoma in situ (DCIS) of right breast  06/29/2019 Initial Diagnosis   Right breast biopsy: Fibrocystic changes, pseudoangiomatous stromal hyperplasia   07/21/2019 Surgery   Right lumpectomy: Dr. Lucia Gaskins: Intermediate grade DCIS 0.2 cm, 0.1 cm from anterior margin, LCIS, ER 95%, PR 30% Tis NX stage 0   07/21/2019 Cancer Staging   Staging form: Breast, AJCC 8th Edition - Pathologic stage from 07/21/2019: Stage 0 (pTis (DCIS), pN0, cM0, ER+, PR+) - Signed by Gardenia Phlegm, NP on 08/03/2019     CHIEF COMPLIANT: Follow-up of right breast DCIS  INTERVAL HISTORY: Catherine Olsen is a 49 y.o. with above-mentioned history of right breast DCIS who underwent a right lumpectomy, and has declined radiation and antiestrogen therapy. She presents to the clinic today for follow-up.   ALLERGIES:  is allergic to other; prednisone; sulfamethoxazole; oxycodone; abilify [aripiprazole]; celebrex [celecoxib]; penicillins; tylenol [acetaminophen]; percocet [oxycodone-acetaminophen]; sulfa antibiotics; and sulfites.  MEDICATIONS:  Current Outpatient Medications  Medication Sig Dispense Refill  . albuterol (VENTOLIN HFA) 108 (90 Base) MCG/ACT inhaler Inhale 2 puffs into the lungs every 6 (six) hours as needed for wheezing or shortness of breath. 8 g 12  . Ascorbic Acid (VITAMIN C) 1000 MG tablet Take 1,000 mg by mouth daily.    . calcium-vitamin D (OSCAL WITH D) 500-200 MG-UNIT tablet Take 1 tablet by mouth.    . cetirizine (ZYRTEC) 10 MG tablet Take 10 mg by mouth  daily.    . clonazePAM (KLONOPIN) 1 MG tablet Take 1 mg by mouth 4 (four) times daily as needed.    Marland Kitchen EPINEPHrine 0.3 mg/0.3 mL IJ SOAJ injection Inject 0.3 mLs into the skin once.  2  . HYDROcodone-acetaminophen (NORCO/VICODIN) 5-325 MG tablet Take 1 tablet by mouth every 6 (six) hours as needed for moderate pain. (Patient not taking: Reported on 08/03/2019) 20 tablet 0  . ibuprofen (ADVIL,MOTRIN) 800 MG tablet TAKE 1 TABLET(800 MG) BY MOUTH EVERY 8 HOURS AS NEEDED (Patient taking differently: 600-800 mg as needed. ) 30 tablet 0  . magnesium oxide (MAG-OX) 400 MG tablet Take 400 mg by mouth daily.    . nicotine (NICODERM CQ - DOSED IN MG/24 HOURS) 21 mg/24hr patch 21 mg daily.    . temazepam (RESTORIL) 15 MG capsule Take 15-45 mg by mouth at bedtime.     No current facility-administered medications for this visit.    PHYSICAL EXAMINATION: ECOG PERFORMANCE STATUS: 1 - Symptomatic but completely ambulatory  There were no vitals filed for this visit. There were no vitals filed for this visit.  LABORATORY DATA:  I have reviewed the data as listed CMP Latest Ref Rng & Units 07/07/2019 12/24/2018 09/07/2018  Glucose 70 - 99 mg/dL 96 98 89  BUN 6 - 20 mg/dL 14 20 18   Creatinine 0.44 - 1.00 mg/dL 0.63 0.67 0.67  Sodium 135 - 145 mmol/L 138 143 139  Potassium 3.5 - 5.1 mmol/L 3.8 4.1 3.8  Chloride 98 - 111 mmol/L 106 106 105  CO2 22 - 32 mmol/L 21(L) 22 25  Calcium 8.9 - 10.3 mg/dL 9.0 9.5 9.0  Total Protein 6.5 - 8.1 g/dL 6.2(L) 6.4 6.9  Total Bilirubin 0.3 - 1.2 mg/dL 0.6 0.3 0.4  Alkaline Phos 38 - 126 U/L 55 59 75  AST 15 - 41 U/L 8(L) 11 18  ALT 0 - 44 U/L 8 8 13     Lab Results  Component Value Date   WBC 12.2 (H) 07/07/2019   HGB 13.3 07/07/2019   HCT 42.3 07/07/2019   MCV 100.7 (H) 07/07/2019   PLT 243 07/07/2019   NEUTROABS 10.6 (H) 07/07/2019    ASSESSMENT & PLAN:  Ductal carcinoma in situ (DCIS) of right breast Right lumpectomy: Dr. Lucia Gaskins: Intermediate grade DCIS 0.2 cm,  0.1 cm from anterior margin, LCIS, ER 95%, PR 30%  Tis NX stage 0  Treatment plan: 1.  Adjuvant radiation therapy: Patient discussed with Dr. Sondra Come and agreed to radiation. 2.  I discussed with her about pros and cons of antiestrogen therapy for 5 years. The reduction risk of cancer recurrence is small and the risks are great given her frail mental status and the fact that her estradiol levels are 3.7.  Therefore we decided not to use antiestrogen therapy. We will monitor her estradiol level every 3 months  RTC in 3 months with labs and in 6 months with labs and follow up with me    No orders of the defined types were placed in this encounter.  The patient has a good understanding of the overall plan. she agrees with it. she will call with any problems that may develop before the next visit here.  Total time spent: 30 mins including face to face time and time spent for planning, charting and coordination of care  Catherine Lose, MD 08/12/2019  I, Cloyde Reams Dorshimer, am acting as scribe for Dr. Nicholas Olsen.  I have reviewed the above documentation for accuracy and completeness, and I agree with the above.

## 2019-08-12 ENCOUNTER — Inpatient Hospital Stay: Payer: Medicare Other | Attending: Hematology and Oncology | Admitting: Hematology and Oncology

## 2019-08-12 ENCOUNTER — Other Ambulatory Visit: Payer: Self-pay

## 2019-08-12 ENCOUNTER — Inpatient Hospital Stay: Payer: Medicare Other

## 2019-08-12 DIAGNOSIS — D0511 Intraductal carcinoma in situ of right breast: Secondary | ICD-10-CM | POA: Insufficient documentation

## 2019-08-12 DIAGNOSIS — Z170 Estrogen receptor positive status [ER+]: Secondary | ICD-10-CM | POA: Insufficient documentation

## 2019-08-12 NOTE — Assessment & Plan Note (Addendum)
Right lumpectomy: Dr. 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  Adjuvant radiation therapy: Patient discussed with Dr. Sondra Come and agreed to radiation. 2.  I discussed with her about pros and cons of antiestrogen therapy for 5 years. The reduction risk of cancer recurrence is small and the risks are great given her frail mental status.  Therefore we decided not to use antiestrogen therapy.

## 2019-08-15 ENCOUNTER — Other Ambulatory Visit: Payer: Self-pay | Admitting: Surgery

## 2019-08-15 ENCOUNTER — Encounter: Payer: Self-pay | Admitting: *Deleted

## 2019-08-15 DIAGNOSIS — N6012 Diffuse cystic mastopathy of left breast: Secondary | ICD-10-CM

## 2019-08-15 DIAGNOSIS — N6011 Diffuse cystic mastopathy of right breast: Secondary | ICD-10-CM

## 2019-08-16 ENCOUNTER — Other Ambulatory Visit: Payer: Self-pay | Admitting: Surgery

## 2019-08-16 ENCOUNTER — Ambulatory Visit
Admission: RE | Admit: 2019-08-16 | Discharge: 2019-08-16 | Disposition: A | Discharge status: Home / Self Care | Payer: Medicare Other | Source: Ambulatory Visit | Attending: Surgery | Admitting: Surgery

## 2019-08-16 ENCOUNTER — Other Ambulatory Visit: Payer: Self-pay

## 2019-08-16 DIAGNOSIS — N6320 Unspecified lump in the left breast, unspecified quadrant: Secondary | ICD-10-CM

## 2019-08-16 DIAGNOSIS — R590 Localized enlarged lymph nodes: Secondary | ICD-10-CM | POA: Diagnosis not present

## 2019-08-16 DIAGNOSIS — D0511 Intraductal carcinoma in situ of right breast: Secondary | ICD-10-CM

## 2019-08-17 ENCOUNTER — Other Ambulatory Visit: Payer: Medicare Other

## 2019-08-17 LAB — ESTRADIOL, ULTRA SENS: Estradiol, Sensitive: <2.5 pg/mL

## 2019-08-18 ENCOUNTER — Telehealth: Payer: Self-pay | Admitting: Hematology and Oncology

## 2019-08-18 ENCOUNTER — Encounter: Payer: Self-pay | Admitting: *Deleted

## 2019-08-18 ENCOUNTER — Encounter: Payer: Self-pay | Admitting: Hematology and Oncology

## 2019-08-18 DIAGNOSIS — M25572 Pain in left ankle and joints of left foot: Secondary | ICD-10-CM | POA: Diagnosis not present

## 2019-08-18 DIAGNOSIS — M47817 Spondylosis without myelopathy or radiculopathy, lumbosacral region: Secondary | ICD-10-CM | POA: Diagnosis not present

## 2019-08-18 DIAGNOSIS — M545 Low back pain: Secondary | ICD-10-CM | POA: Diagnosis not present

## 2019-08-18 DIAGNOSIS — M79605 Pain in left leg: Secondary | ICD-10-CM | POA: Diagnosis not present

## 2019-08-18 NOTE — Telephone Encounter (Signed)
Scheduled per 5/13 sch msg. Pt aware of appts.

## 2019-08-30 DIAGNOSIS — Z780 Asymptomatic menopausal state: Secondary | ICD-10-CM | POA: Diagnosis not present

## 2019-08-30 DIAGNOSIS — R9349 Abnormal radiologic findings on diagnostic imaging of other urinary organs: Secondary | ICD-10-CM | POA: Diagnosis not present

## 2019-08-30 DIAGNOSIS — Z5320 Procedure and treatment not carried out because of patient's decision for unspecified reasons: Secondary | ICD-10-CM | POA: Diagnosis not present

## 2019-08-31 ENCOUNTER — Telehealth: Payer: Self-pay

## 2019-08-31 ENCOUNTER — Encounter: Payer: Self-pay | Admitting: Hematology and Oncology

## 2019-08-31 NOTE — Telephone Encounter (Signed)
Pt sent mychart message with concerns of estradiol levels. Returned pt's call regarding this matter; informed pt per Dr Lindi Adie estradiol does fluctuate regularly and he would not make anty changes based on these results. Pt verbalized thanks and understanding, and also understands she had a 3 mo f/u with labs to review her estradiol.

## 2019-09-07 DIAGNOSIS — N6012 Diffuse cystic mastopathy of left breast: Secondary | ICD-10-CM | POA: Diagnosis not present

## 2019-09-08 DIAGNOSIS — R590 Localized enlarged lymph nodes: Secondary | ICD-10-CM | POA: Diagnosis not present

## 2019-09-08 DIAGNOSIS — K30 Functional dyspepsia: Secondary | ICD-10-CM | POA: Diagnosis not present

## 2019-09-08 NOTE — Telephone Encounter (Signed)
error 

## 2019-09-10 DIAGNOSIS — F3162 Bipolar disorder, current episode mixed, moderate: Secondary | ICD-10-CM | POA: Diagnosis not present

## 2019-09-10 DIAGNOSIS — F4312 Post-traumatic stress disorder, chronic: Secondary | ICD-10-CM | POA: Diagnosis not present

## 2019-09-12 DIAGNOSIS — R590 Localized enlarged lymph nodes: Secondary | ICD-10-CM | POA: Diagnosis not present

## 2019-09-12 DIAGNOSIS — S0991XA Unspecified injury of ear, initial encounter: Secondary | ICD-10-CM | POA: Diagnosis not present

## 2019-09-12 DIAGNOSIS — M545 Low back pain: Secondary | ICD-10-CM | POA: Diagnosis not present

## 2019-09-12 DIAGNOSIS — M25572 Pain in left ankle and joints of left foot: Secondary | ICD-10-CM | POA: Diagnosis not present

## 2019-09-15 ENCOUNTER — Other Ambulatory Visit: Payer: Self-pay | Admitting: Surgery

## 2019-09-15 ENCOUNTER — Other Ambulatory Visit: Payer: Self-pay | Admitting: Family Medicine

## 2019-09-15 DIAGNOSIS — D0591 Unspecified type of carcinoma in situ of right breast: Secondary | ICD-10-CM

## 2019-09-16 ENCOUNTER — Other Ambulatory Visit: Payer: Self-pay | Admitting: Surgery

## 2019-09-16 DIAGNOSIS — D0591 Unspecified type of carcinoma in situ of right breast: Secondary | ICD-10-CM

## 2019-09-22 ENCOUNTER — Ambulatory Visit (HOSPITAL_COMMUNITY)
Admission: EM | Admit: 2019-09-22 | Discharge: 2019-09-22 | Disposition: A | Discharge status: Home / Self Care | Payer: Medicare Other | Attending: Family | Admitting: Family

## 2019-09-22 ENCOUNTER — Other Ambulatory Visit: Payer: Self-pay

## 2019-09-22 DIAGNOSIS — F319 Bipolar disorder, unspecified: Secondary | ICD-10-CM | POA: Diagnosis not present

## 2019-09-22 DIAGNOSIS — F1410 Cocaine abuse, uncomplicated: Secondary | ICD-10-CM

## 2019-09-22 DIAGNOSIS — F3113 Bipolar disorder, current episode manic without psychotic features, severe: Secondary | ICD-10-CM

## 2019-09-22 DIAGNOSIS — F4310 Post-traumatic stress disorder, unspecified: Secondary | ICD-10-CM | POA: Diagnosis not present

## 2019-09-22 DIAGNOSIS — Z915 Personal history of self-harm: Secondary | ICD-10-CM | POA: Insufficient documentation

## 2019-09-22 DIAGNOSIS — F3162 Bipolar disorder, current episode mixed, moderate: Secondary | ICD-10-CM | POA: Diagnosis not present

## 2019-09-22 DIAGNOSIS — F419 Anxiety disorder, unspecified: Secondary | ICD-10-CM | POA: Insufficient documentation

## 2019-09-22 DIAGNOSIS — F4311 Post-traumatic stress disorder, acute: Secondary | ICD-10-CM
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
## 2019-09-22 NOTE — ED Provider Notes (Addendum)
Behavioral Health Medical Screening Exam   Catherine Olsen is a 49 y.o. female.  Patient arrives to [facility] behavioral health for walk-in assessment, accompanied by mother. Patient alert and oriented, answers appropriately. Patient denies suicidal ideations.  Patient denies self-harm behaviors.  Patient reports history of 2 prior suicide attempts, last approximately 5 years ago. Patient denies homicidal ideations.  Patient denies auditory and visual hallucinations.  Patient denies symptoms of paranoia. Patient reports "I have a lot of stressors."  Patient reports challenging living situation as she resides with her mother who is not supportive. Patient reports residing in [place] with her mother.  Patient denies access to weapons.  Patient states "I have been waiting for 4 years for the [place] but they do not seem to care."  Patient reports "I do not get along with my mother." Patient currently receives disability benefits. Patient reports she rarely uses alcohol.  Patient reports use of cocaine, "I use it to cope whenever I can exercise."  Patient reports "I am not going to answer that" when discussing last use of cocaine. Patient currently seen by outpatient psychiatry, Dr. [name].  Patient reports she is currently treated for diagnoses of PTSD, anxiety and bipolar disorder.  Patient reports "I was only prescribed benzos so I pulled myself off of them weeks ago."  Patient last saw psychiatrist approximately 1 to 2 weeks ago.  Patient reports current treatment plan includes "finding a new talk therapist, I ended with my therapist months ago." Patient contracts verbally for safety.  Total Time spent with patient: 30 minutes  Psychiatric Specialty Exam  Presentation  General Appearance:Appropriate for Environment  Eye Contact:Good  Speech:Clear and Coherent;Normal Rate  Speech Volume:Normal  Handedness:Right   Mood and Affect  Mood:Depressed  Affect:Appropriate   Thought  Process  Thought Processes:Coherent;Goal Directed  Descriptions of Associations:Intact  Orientation:Full (Time, Place and Person)  Thought Content:WDL  Hallucinations:None  Ideas of Reference:None  Suicidal Thoughts:No  Homicidal Thoughts:No   Sensorium  Memory:Immediate Good;Recent Good  Judgment:Fair  Insight:Fair   Executive Functions  Concentration:Good  Attention Span:Good  Recall:Good  Fund of Knowledge:Good  Language:Good   Psychomotor Activity  Psychomotor Activity:No data recorded  Assets  Assets:Communication Skills;Desire for Improvement;Financial Resources/Insurance;Housing;Resilience;Social Support   Sleep  Sleep:Fair  Number of hours: No data recorded  Physical Exam: Physical Exam ROS Blood pressure 122/83, pulse 85, temperature 98.2 F (36.8 C), temperature source Oral, resp. rate 18, height 5\' 8"  (1.727 m), weight 129 lb (58.5 kg), last menstrual period 11/21/2014, SpO2 99 %. Body mass index is 19.61 kg/m.  Musculoskeletal: Strength & Muscle Tone: within normal limits Gait & Station: normal Patient leans: N/A   Recommendations: Patient discussed with Dr. Dwyane Dee.  Follow-up with established outpatient psychiatrist.  Follow-up with resources for therapist, provided by TTS counselor.  Based on my evaluation the patient does not appear to have an emergency medical condition.  Emmaline Kluver, FNP 09/22/2019, 5:55 PM

## 2019-09-22 NOTE — ED Notes (Signed)
Patient belongings in locker 28 at time [phone]

## 2019-09-22 NOTE — Discharge Instructions (Signed)
Bipolar 1 Disorder Bipolar 1 disorder is a mental health disorder in which a person has episodes of emotional highs, or mania, and may also have episodes of lows, or depression. Bipolar 1 disorder is different from other bipolar disorders in that it involves extreme episodes of mania (manic episodes). These episodes last at least one week or involve symptoms that are so severe that hospitalization is needed to keep the person safe. What are the causes? The cause of this condition is not known. What increases the risk? The following factors may make you more likely to develop this condition:  Having a family member with the disorder.  Having an imbalance of certain chemicals in the brain (neurotransmitters).  Experiencing stress, such as illness, financial problems, or a death.  Having certain conditions that affect the brain or spinal cord (neurologic conditions).  Having had a brain injury (trauma). What are the signs or symptoms? Symptoms of mania include:  Very high self-esteem or self-confidence.  Decreased need for sleep.  Unusual talkativeness. Speech may be very fast.  Racing thoughts with quick shifts between topics that may or may not be related (flight of ideas).  Ability to concentrate either greatly improved or decreased.  Increased purposeful activity, such as work, studies, or social activity.  Increased agitation. This could be pacing, squirming, fidgeting, or finger and toe tapping.  Impulsive behavior and poor judgment. This may result in high-risk activities that are sexual, financial, or physical. Symptoms of depression include:  Extreme degrees of sadness, uncontrollable crying, hopelessness, worthlessness, or numbness.  Sleep problems, such as insomnia, waking early, or sleeping too much.  No longer enjoying things you used to enjoy.  Isolation. You may often spend time alone.  Lack of energy or motivation, and moving more slowly than  normal.  Trouble making decisions.  Increased appetite or loss of appetite.  Thoughts of death, or wanting to harm yourself. Sometimes, you may have a mixed mood. This means having symptoms of mania and depression at the same time. Stress can often trigger these symptoms. How is this diagnosed? This condition may be diagnosed based on:  Emotional episodes.  Medical history.  Use of alcohol, drugs, and prescription medicines. Certain medical conditions and substances can cause symptoms that seem like bipolar disorder. This is called secondary bipolar disorder. Your health care provider may ask you to take a short test. This helps to understand your symptoms. You may also be asked to see a mental health specialist to follow up on this diagnosis and start treatment. How is this treated?     This condition is a long-term (chronic) illness. It is often managed with ongoing treatment rather than treatment only when symptoms occur. A combination of treatments is the main approach. Treatment may include:  Medicine. Medicine can be prescribed by a health care provider who specializes in treating mental health disorders (psychiatrist). Medicines called mood stabilizers are usually prescribed. If symptoms occur during treatment with a mood stabilizer, other medicines may be added.  Psychotherapy. Some forms of talk therapy, such as cognitive behavioral therapy (CBT) and family therapy, can help with learning to manage bipolar disorder.  Psychoeducation. This helps you and others understand how this disorder is managed. Include friends and family in educational sessions so they learn how best to support you.  Methods of managing your condition, such as journaling or relaxation exercises. Relaxation exercises include: ? Yoga. ? Meditation. ? Deep breathing.  Lifestyle changes, such as: ? Limiting alcohol and drug use. ?  Exercising regularly. ? Structuring when you go to bed and get  up. ? Eating a healthy diet.  Electroconvulsive therapy (ECT). This is a procedure in which electricity is applied to the brain through the scalp. It may be used in cases of severe bipolar disorder when medicine and psychotherapy work too slowly or do not work. Follow these instructions at home: Activity  Return to your normal activities as told by your health care provider.  Find activities that you enjoy, and make time to do them.  Exercise regularly as told by your health care provider. Lifestyle   Follow a set schedule for eating and sleeping.  Eat a healthy diet that includes fresh fruits and vegetables, whole grains, low-fat dairy, and lean meat.  Get at least 7-8 hours of sleep each night.  Avoid using products that contain nicotine or tobacco. If you want help quitting, ask your health care provider.  Do not use drugs. Alcohol use  Do not drink alcohol if: ? Your health care provider tells you not to drink. ? You are pregnant, may be pregnant, or are planning to become pregnant.  If you drink alcohol: ? Limit how much you use to:  0-1 drink a day for women.  0-2 drinks a day for men. ? Be aware of how much alcohol is in your drink. In the [place] one drink equals one 12 oz bottle of beer (355 mL), one 5 oz glass of wine (148 mL), or one 1 oz glass of hard liquor (44 mL). General instructions  Take over-the-counter and prescription medicines only as told by your health care provider. You may think about stopping your medicine, but it is very important to take all your medicine as prescribed.  Consider joining a support group. Your health care provider may be able to recommend one.  Talk with your family and friends about your treatment goals and how they can help.  Keep all follow-up visits as told by your health care provider. This is important. Where to find more information  National Alliance on Mental Illness: www.nami.org  National Institute of Mental  Health: www.nimh.nih.gov Contact a health care provider if:  Your symptoms get worse, or your loved ones tell you that your symptoms are getting worse.  You have uncomfortable side effects from your medicine.  You have trouble sleeping.  You have trouble doing daily activities.  You feel unsafe in your surroundings.  You are self-medicating with alcohol or drugs. Get help right away if:  You have new symptoms.  You have thoughts about harming yourself or others.  You are considering suicide. If you ever feel like you may hurt yourself or others, or have thoughts about taking your own life, get help right away. You can go to your nearest emergency department or call:  Your local emergency services (911 in the [place]  A suicide crisis helpline, such as the National Suicide Prevention Lifeline at 1-800-273-8255. This is open 24 hours a day. Summary  Bipolar 1 disorder is a lifelong mental health disorder in which a person has episodes of mania and depression.  This disorder is mainly treated with a combination of medicines, talk and behavioral therapies, and, often, electroconvulsive therapy (ECT).  Include friends and family in educational sessions so they know how best to support you.  Get help right away if you are considering suicide. This information is not intended to replace advice given to you by your health care provider. Make sure you discuss any questions you   have with your health care provider. Document Revised: 09/07/2018 Document Reviewed: 09/07/2018 Elsevier Patient Education  [place] all of you medications as prescribed by your mental healthcare provider.  Report any adverse effects and reactions from your medications to your outpatient provider promptly.  Do not engage in alcohol and or illegal drug use while on prescription medicines. Keep all scheduled appointments. This is to ensure that you are getting refills on time and to avoid any  interruption in your medication.  If you are unable to keep an appointment call to reschedule.  Be sure to follow up with resources and follow ups given. In the event of worsening symptoms call the crisis hotline, 911, and or go to the nearest emergency department for appropriate evaluation and treatment of symptoms. Follow-up with your primary care provider for your medical issues, concerns and or health care needs.

## 2019-09-22 NOTE — ED Notes (Signed)
Pt discharged by TTS Staff, given Resources.  Left Urgent Care, A&O x4, no distress noted.

## 2019-09-26 ENCOUNTER — Inpatient Hospital Stay: Admission: RE | Admit: 2019-09-26 | Payer: Medicare Other | Source: Ambulatory Visit

## 2019-09-26 DIAGNOSIS — R198 Other specified symptoms and signs involving the digestive system and abdomen: Secondary | ICD-10-CM | POA: Diagnosis not present

## 2019-09-29 ENCOUNTER — Other Ambulatory Visit: Payer: Self-pay

## 2019-09-29 ENCOUNTER — Ambulatory Visit (INDEPENDENT_AMBULATORY_CARE_PROVIDER_SITE_OTHER): Payer: Medicare Other | Admitting: Licensed Clinical Social Worker

## 2019-09-29 DIAGNOSIS — F3160 Bipolar disorder, current episode mixed, unspecified: Secondary | ICD-10-CM | POA: Diagnosis not present
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
## 2019-10-03 ENCOUNTER — Encounter (HOSPITAL_COMMUNITY): Payer: Self-pay

## 2019-10-03 ENCOUNTER — Emergency Department (HOSPITAL_COMMUNITY)
Admission: EM | Admit: 2019-10-03 | Discharge: 2019-10-03 | Disposition: A | Discharge status: Home / Self Care | Payer: Medicare Other | Attending: Emergency Medicine | Admitting: Emergency Medicine

## 2019-10-03 ENCOUNTER — Other Ambulatory Visit: Payer: Self-pay

## 2019-10-03 DIAGNOSIS — J449 Chronic obstructive pulmonary disease, unspecified: Secondary | ICD-10-CM | POA: Diagnosis not present

## 2019-10-03 DIAGNOSIS — J45909 Unspecified asthma, uncomplicated: Secondary | ICD-10-CM | POA: Diagnosis not present

## 2019-10-03 DIAGNOSIS — H6012 Cellulitis of left external ear: Secondary | ICD-10-CM | POA: Insufficient documentation

## 2019-10-03 DIAGNOSIS — Z792 Long term (current) use of antibiotics: Secondary | ICD-10-CM | POA: Insufficient documentation

## 2019-10-03 DIAGNOSIS — H9212 Otorrhea, left ear: Secondary | ICD-10-CM | POA: Diagnosis present

## 2019-10-03 DIAGNOSIS — F17210 Nicotine dependence, cigarettes, uncomplicated: Secondary | ICD-10-CM | POA: Diagnosis not present

## 2019-10-03 MED ORDER — CLINDAMYCIN HCL 150 MG PO CAPS
450.0000 mg | ORAL_CAPSULE | Freq: Three times a day (TID) | ORAL | 0 refills | Status: AC
Start: 2019-10-03 — End: 2019-10-10
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
## 2019-10-03 NOTE — ED Notes (Signed)
Signature pad not connected; pt verbalized understanding of discharge instructions.

## 2019-10-03 NOTE — Discharge Instructions (Signed)
Use warm compresses at least 4 times a day.  Follow-up with ear nose and throat in the office.  Please return for rapid spreading redness or fever.

## 2019-10-03 NOTE — ED Triage Notes (Signed)
Pt sts left sided eat cyst that needs to be removed. Pt sts she was sent here by [facility].

## 2019-10-04 ENCOUNTER — Emergency Department (HOSPITAL_COMMUNITY)
Admission: EM | Admit: 2019-10-04 | Discharge: 2019-10-04 | Disposition: A | Discharge status: Home / Self Care | Payer: Medicare Other | Attending: Emergency Medicine | Admitting: Emergency Medicine

## 2019-10-04 ENCOUNTER — Other Ambulatory Visit: Payer: Self-pay

## 2019-10-04 ENCOUNTER — Encounter (HOSPITAL_COMMUNITY): Payer: Self-pay

## 2019-10-04 DIAGNOSIS — L723 Sebaceous cyst: Secondary | ICD-10-CM | POA: Diagnosis not present

## 2019-10-04 DIAGNOSIS — H6002 Abscess of left external ear: Secondary | ICD-10-CM | POA: Diagnosis not present

## 2019-10-04 DIAGNOSIS — H9202 Otalgia, left ear: Secondary | ICD-10-CM | POA: Diagnosis present

## 2019-10-04 DIAGNOSIS — F17210 Nicotine dependence, cigarettes, uncomplicated: Secondary | ICD-10-CM | POA: Insufficient documentation

## 2019-10-04 DIAGNOSIS — J45909 Unspecified asthma, uncomplicated: Secondary | ICD-10-CM | POA: Diagnosis not present

## 2019-10-04 DIAGNOSIS — Z853 Personal history of malignant neoplasm of breast: Secondary | ICD-10-CM | POA: Diagnosis not present

## 2019-10-04 DIAGNOSIS — N809 Endometriosis, unspecified: Secondary | ICD-10-CM | POA: Diagnosis not present

## 2019-10-04 DIAGNOSIS — D259 Leiomyoma of uterus, unspecified: Secondary | ICD-10-CM | POA: Diagnosis not present

## 2019-10-04 DIAGNOSIS — Z79899 Other long term (current) drug therapy: Secondary | ICD-10-CM | POA: Diagnosis not present

## 2019-10-04 DIAGNOSIS — H61892 Other specified disorders of left external ear: Secondary | ICD-10-CM | POA: Diagnosis not present

## 2019-10-04 MED ORDER — BUPIVACAINE HCL 0.25 % IJ SOLN: 20.0000 mL | Freq: Once | INTRAMUSCULAR | Status: DC

## 2019-10-04 MED ORDER — BUPIVACAINE HCL (PF) 0.25 % IJ SOLN
20.0000 mL | Freq: Once | INTRAMUSCULAR | Status: AC
  Administered 2019-10-04: 20 mL
  Filled 2019-10-04: qty 30
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
## 2019-10-04 NOTE — ED Triage Notes (Signed)
Pt presents with c/o left sided ear cyst that she was seen for last night, referred to a specialist. Pt reports that the specialist is not able to see her for weeks. Pt reports she feels the area needs to be drained.

## 2019-10-04 NOTE — Discharge Instructions (Addendum)
Return to the ed for fever, worsening pain or swelling, extending redness or streaking. Be sure to continue your antibiotic.

## 2019-10-06 ENCOUNTER — Ambulatory Visit: Payer: Medicare Other | Admitting: Physician Assistant

## 2019-10-06 ENCOUNTER — Ambulatory Visit (INDEPENDENT_AMBULATORY_CARE_PROVIDER_SITE_OTHER): Payer: Medicare Other | Admitting: Licensed Clinical Social Worker

## 2019-10-06 ENCOUNTER — Other Ambulatory Visit: Payer: Self-pay

## 2019-10-06 ENCOUNTER — Ambulatory Visit: Payer: Self-pay

## 2019-10-06 DIAGNOSIS — H6690 Otitis media, unspecified, unspecified ear: Secondary | ICD-10-CM | POA: Diagnosis not present

## 2019-10-06 DIAGNOSIS — F3160 Bipolar disorder, current episode mixed, unspecified: Secondary | ICD-10-CM

## 2019-10-06 MED ORDER — CEFDINIR 300 MG PO CAPS: 300.0000 mg | ORAL_CAPSULE | Freq: Two times a day (BID) | ORAL | 0 refills | Status: DC

## 2019-10-06 NOTE — Telephone Encounter (Signed)
Pt has an upcoming appt with Dr. Margarita Rana on 10/27/19.

## 2019-10-06 NOTE — Patient Instructions (Signed)
I am sending a referral to ENT for you, please follow up with them   Please let us know if there is anything else we can do for you  Kennieth Rad, PA-C Physician Assistant [facility] [email or web address]  Cefdinir Capsules What is this medicine? CEFDINIR (SEF di ner) is a cephalosporin antibiotic. It treats some infections caused by bacteria. It will not work for colds, the flu, or other viruses. This medicine may be used for other purposes; ask your health care provider or pharmacist if you have questions. COMMON BRAND NAME(S): Omnicef What should I tell my health care provider before I take this medicine? They need to know if you have any of these conditions:  bleeding problems  kidney disease  stomach or intestine problems (especially colitis)  an unusual or allergic reaction to cefdinir, other cephalosporin antibiotics, penicillin, penicillamine, other foods, dyes or preservatives  pregnant or trying to get pregnant  breast-feeding How should I use this medicine? Take this drug by mouth. Take it as directed on the prescription label at the same time every day. You can take it with or without food. If it upsets your stomach, take it with food. Take all of this drug unless your health care provider tells you to stop it early. Keep taking it even if you think you are better. Take products with aluminum, magnesium, or iron in them at a different time of day than this drug. Take this drug 2 hours BEFORE or 2 hours AFTER these products. Talk to your health care provider about the use of this drug in children. While it may be prescribed for selected conditions, precautions do apply. Overdosage: If you think you have taken too much of this medicine contact a poison control center or emergency room at once. NOTE: This medicine is only for you. Do not share this medicine with others. What if I miss a dose? If you miss a dose, take  it as soon as you can. If it is almost time for your next dose, take only that dose. Do not take double or extra doses. What may interact with this medicine?  antacids that contain aluminum or magnesium  iron supplements  other antibiotics  probenecid This list may not describe all possible interactions. Give your health care provider a list of all the medicines, herbs, non-prescription drugs, or dietary supplements you use. Also tell them if you smoke, drink alcohol, or use illegal drugs. Some items may interact with your medicine. What should I watch for while using this medicine? Tell your doctor or health care provider if your symptoms do not get better in a few days. This medicine may cause serious skin reactions. They can happen weeks to months after starting the medicine. Contact your health care provider right away if you notice fevers or flu-like symptoms with a rash. The rash may be red or purple and then turn into blisters or peeling of the skin. Or, you might notice a red rash with swelling of the face, lips or lymph nodes in your neck or under your arms. If you are diabetic you may get a false-positive result for sugar in your urine. Check with your doctor or health care provider before you change your diet or the dose of your diabetes medicine. What side effects may I notice from receiving this medicine? Side effects that you should report to your doctor or health care professional as soon as possible:  allergic reactions like skin rash, itching or hives, swelling  of the face, lips, or tongue  bloody or watery diarrhea  breathing problems  fever  redness, blistering, peeling or loosening of the skin, including inside the mouth  seizures  trouble passing urine or change in the amount of urine  unusual bleeding or bruising  unusually weak or tired Side effects that usually do not require medical attention (report to your doctor or health care professional if they  continue or are bothersome):  constipation  diarrhea  dizziness  dry mouth  headache  loss of appetite  nausea, vomiting  stomach pain  stool discoloration  tiredness  vaginal discharge, itching, or odor in women This list may not describe all possible side effects. Call your doctor for medical advice about side effects. You may report side effects to FDA at 1-800-FDA-1088. Where should I keep my medicine? Keep out of the reach of children and pets. Store at room temperature between 20 and 25 degrees C (68 and 77 degrees F). Throw away any unused drug after the expiration date. NOTE: This sheet is a summary. It may not cover all possible information. If you have questions about this medicine, talk to your doctor, pharmacist, or health care provider.  2020 Elsevier/Gold Standard (2018-10-27 15:57:02)

## 2019-10-06 NOTE — Progress Notes (Signed)
Patient complains of left ear pain with sensitivity on the outside of the ear.

## 2019-10-06 NOTE — Progress Notes (Signed)
Established Patient Office Visit  Subjective:  Patient ID: Catherine Olsen, female    DOB: [birth year]  Age: 49 y.o. MRN: [id number]  CC:  Chief Complaint  Patient presents with  . Ear Pain    HPI Catherine Olsen reports that she has been having bilateral ear pain, states that she is currently being treated for an infected abscess on her left earlobe, states that she went to the emergency department 3 days ago, they attempted to lance it without much relief, started her on clindamycin.  Reports she has 4 doses of clindamycin left.  Reports that her right ear began to become painful yesterday, endorses significant history of recurrent inner ear infections, states that she has swollen lymph nodes.  Denies fever or any other URI symptoms.  Reports that she has not noticed any improvement since starting the clindamycin and reports that she was previously treated for OM with cefdinir with relief.  Reports she has previously been seen by ENT, but would like to have a referral to a new one.    Past Medical History:  Diagnosis Date  . Abnormal uterine bleeding (AUB) 06/25/2009   Qualifier: Diagnosis of  By: Cathren Laine MD, Ankit    . Allergy   . Anxiety   . Arthritis    hands, lower back, knees  . Asthma   . Bipolar 1 disorder ([place])   . Breast cancer (HCC)    stage 0  . COPD (chronic obstructive pulmonary disease) ([place])   . Depression   . Endometriosis   . Fibromyalgia   . GERD (gastroesophageal reflux disease)   . Headache(784.0)    otc med prn  . Migraine   . Pituitary tumor    microadenoma  . PONV (postoperative nausea and vomiting)   . PTSD (post-traumatic stress disorder)   . Scoliosis   . Termination of pregnancy    x 2 at age 52 and 49 yrs old  . Tobacco abuse 03/04/2015    Past Surgical History:  Procedure Laterality Date  . BREAST BIOPSY Right 05/19/2007  . BREAST BIOPSY Right 06/02/2007  . BREAST BIOPSY  06/29/2019  . BREAST LUMPECTOMY WITH RADIOACTIVE SEED LOCALIZATION  Right 07/21/2019   Procedure: RIGHT BREAST LUMPECTOMY WITH RADIOACTIVE SEED LOCALIZATION;  Surgeon: Alphonsa Overall, MD;  Location: [facility];  Service: General;  Laterality: Right;  . BREAST SURGERY    . DILATION AND CURETTAGE OF UTERUS    . FACIAL COSMETIC SURGERY     right cheek  . LAPAROSCOPY Right 11/11/2013   Procedure: LAPAROSCOPY OPERATIVE with  Drainage of  RIGHT Ovarian ENDOMETRIOMA;  Surgeon: Elveria Royals, MD;  Location: [facility];  Service: Gynecology;  Laterality: Right;  . NASAL ENDOSCOPY     said it showed some acid reflux from ENT  . NOSE SURGERY     rhinoplasty at age 74 yrs  . WISDOM TOOTH EXTRACTION      Family History  Problem Relation Age of Onset  . Diabetes Mother   . Hypertension Mother   . Stroke Mother 48       CVA  . Mental illness Mother        no diagnosis; personality disorder  . Hyperlipidemia Father   . Hypertension Father   . COPD Father   . Alpha-1 antitrypsin deficiency Father   . Asthma Father   . Alpha-1 antitrypsin deficiency Brother   . Asthma Brother   . Diabetes Maternal Grandmother   . Heart disease Maternal Grandmother   .  Hyperlipidemia Maternal Grandmother   . Hypertension Maternal Grandmother   . Mental illness Maternal Grandmother   . Heart disease Maternal Grandfather   . Hyperlipidemia Maternal Grandfather   . Hypertension Maternal Grandfather   . Asthma Maternal Grandfather   . Heart disease Paternal Grandfather   . Hyperlipidemia Paternal Grandfather   . Hypertension Paternal Grandfather   . Stroke Paternal Grandfather   . Alzheimer's disease Paternal Grandmother   . Allergic rhinitis Neg Hx   . Angioedema Neg Hx   . Eczema Neg Hx   . Urticaria Neg Hx   . Immunodeficiency Neg Hx   . Colon cancer Neg Hx   . Esophageal cancer Neg Hx     Social History   Socioeconomic History  . Marital status: Divorced    Spouse name: Not on file  . Number of children: 0  . Years of education: Not on file  .  Highest education level: Bachelor's degree (e.g., BA, AB, BS)  Occupational History  . Occupation: disability    Comment: mental illness  Tobacco Use  . Smoking status: Current Some Day Smoker    Packs/day: 2.00    Years: 29.00    Pack years: 58.00    Types: Cigarettes  . Smokeless tobacco: Never Used  Vaping Use  . Vaping Use: Former  Substance and Sexual Activity  . Alcohol use: Not Currently  . Drug use: Not Currently    Types: "Crack" cocaine    Comment: 07/19/2019  . Sexual activity: Not Currently    Comment: abortion at 4 and 8yrs. of age   Other Topics Concern  . Not on file  Social History Narrative   Marital status: divorced; not dating      Children: none      Lives: alone with mom      Employment: disability for mental illness in 1997.  Hospitalizations x 9 in past.      Tobacco: 1 ppd x since age 34. Decreasing in 2018.      Alcohol: socially; beers.      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      Exercise: yoga daily; exercise daily.      ADLs: independent with ADLs; no car; depends on others for transportation.      Patient is right-handed. She is divorced and lives with her mother. She drinks 2-3 cups of 1/2 caffeine coffe a day. She walks occasionally for exercise.   Social Determinants of Health   Financial Resource Strain:   . Difficulty of Paying Living Expenses:   Food Insecurity:   . Worried About Charity fundraiser in the Last Year:   . Arboriculturist in the Last Year:   Transportation Needs:   . Film/video editor (Medical):   Marland Kitchen Lack of Transportation (Non-Medical):   Physical Activity:   . Days of Exercise per Week:   . Minutes of Exercise per Session:   Stress:   . Feeling of Stress :   Social Connections:   . Frequency of Communication with Friends and Family:   . Frequency of Social Gatherings with Friends and Family:   . Attends Religious Services:   . Active Member of Clubs or Organizations:   . Attends  Archivist Meetings:   Marland Kitchen Marital Status:   Intimate Partner Violence:   . Fear of Current or Ex-Partner:   . Emotionally Abused:   Marland Kitchen Physically Abused:   . Sexually Abused:  Outpatient Medications Prior to Visit  Medication Sig Dispense Refill  . albuterol (VENTOLIN HFA) 108 (90 Base) MCG/ACT inhaler Inhale 2 puffs into the lungs every 6 (six) hours as needed for wheezing or shortness of breath. 8 g 12  . Ascorbic Acid (VITAMIN C) 1000 MG tablet Take 1,000 mg by mouth daily.    . calcium-vitamin D (OSCAL WITH D) 500-200 MG-UNIT tablet Take 1 tablet by mouth.    . cetirizine (ZYRTEC) 10 MG tablet Take 10 mg by mouth daily.    . clindamycin (CLEOCIN) 150 MG capsule Take 3 capsules (450 mg total) by mouth 3 (three) times daily for 7 days. 63 capsule 0  . clonazePAM (KLONOPIN) 1 MG tablet Take 1 mg by mouth 4 (four) times daily as needed.    Marland Kitchen EPINEPHrine 0.3 mg/0.3 mL IJ SOAJ injection Inject 0.3 mLs into the skin once.  2  . ibuprofen (ADVIL,MOTRIN) 800 MG tablet TAKE 1 TABLET(800 MG) BY MOUTH EVERY 8 HOURS AS NEEDED (Patient taking differently: 600-800 mg as needed. ) 30 tablet 0  . magnesium oxide (MAG-OX) 400 MG tablet Take 400 mg by mouth daily.    . nicotine (NICODERM CQ - DOSED IN MG/24 HOURS) 21 mg/24hr patch 21 mg daily.    . temazepam (RESTORIL) 15 MG capsule Take 15-45 mg by mouth at bedtime.    Marland Kitchen HYDROcodone-acetaminophen (NORCO/VICODIN) 5-325 MG tablet Take 1 tablet by mouth every 6 (six) hours as needed for moderate pain. (Patient not taking: Reported on 08/03/2019) 20 tablet 0   No facility-administered medications prior to visit.    Allergies  Allergen Reactions  . Other Anaphylaxis    Idiopathic (possible binder or preservatives in medication)    . Prednisone Anaphylaxis    Steroids Steroids ( can take with benadryl )   Can not take higher than 40 mg   . Sulfamethoxazole Anaphylaxis  . Oxycodone Other (See Comments)    Severe blindness?  . Abilify  [Aripiprazole]     Generic Abilify--causes severe neurological problems. Can use name brand  . Celebrex [Celecoxib]   . Penicillins Hives    Has patient had a PCN reaction causing immediate rash, facial/tongue/throat swelling, SOB or lightheadedness with hypotension: no Has patient had a PCN reaction causing severe rash involving mucus membranes or skin necrosis: NO Has patient had a PCN reaction that required hospitalizationNO Has patient had a PCN reaction occurring within the last 10 years:NO If all of the above answers are "NO", then may proceed with Cephalosporin use.   . Tylenol [Acetaminophen]     Stomach pain  . Percocet [Oxycodone-Acetaminophen]     Vision loss  . Sulfa Antibiotics Hives    Pt states she is not really allergic to sulfa, but to sulfites.  . Sulfites Other (See Comments)    unknown    ROS Review of Systems    Objective:    Physical Exam Vitals and nursing note reviewed.  Constitutional:      General: She is not in acute distress.    Appearance: Normal appearance. She is not ill-appearing.  HENT:     Head: Normocephalic and atraumatic.     Right Ear: No tenderness. There is mastoid tenderness. Tympanic membrane is erythematous.     Left Ear: Swelling and tenderness present. Tympanic membrane is not erythematous.     Ears:     Comments: Slight erythema noted    Nose: Nose normal.     Mouth/Throat:     Mouth: Mucous membranes are  moist.     Pharynx: Oropharynx is clear.  Eyes:     Extraocular Movements: Extraocular movements intact.     Conjunctiva/sclera: Conjunctivae normal.     Pupils: Pupils are equal, round, and reactive to light.  Cardiovascular:     Rate and Rhythm: Normal rate.     Pulses: Normal pulses.     Heart sounds: Normal heart sounds.  Pulmonary:     Effort: Pulmonary effort is normal.     Breath sounds: Normal breath sounds.  Musculoskeletal:        General: Normal range of motion.     Cervical back: Normal range of motion  and neck supple.  Lymphadenopathy:     Head:     Right side of head: Preauricular adenopathy present.  Skin:    General: Skin is warm and dry.  Neurological:     General: No focal deficit present.     Mental Status: She is alert and oriented to person, place, and time.  Psychiatric:        Mood and Affect: Mood normal.        Behavior: Behavior normal.        Thought Content: Thought content normal.        Judgment: Judgment normal.     BP 121/82 (BP Location: Left Arm, Patient Position: Sitting, Cuff Size: Normal)   Pulse 66   Temp 98.9 F (37.2 C) (Oral)   Resp 18   Ht 5\' 8"  (1.727 m)   Wt 132 lb (59.9 kg)   LMP 11/21/2014   BMI 20.07 kg/m  Wt Readings from Last 3 Encounters:  10/06/19 132 lb (59.9 kg)  10/03/19 130 lb (59 kg)  08/12/19 126 lb 9.6 oz (57.4 kg)     Health Maintenance Due  Topic Date Due  . COVID-19 Vaccine (1) Never done  . PAP SMEAR-Modifier  01/25/2018    There are no preventive care reminders to display for this patient.  Lab Results  Component Value Date   TSH 2.000 10/29/2017   Lab Results  Component Value Date   WBC 12.2 (H) 07/07/2019   HGB 13.3 07/07/2019   HCT 42.3 07/07/2019   MCV 100.7 (H) 07/07/2019   PLT 243 07/07/2019   Lab Results  Component Value Date   NA 138 07/07/2019   K 3.8 07/07/2019   CO2 21 (L) 07/07/2019   GLUCOSE 96 07/07/2019   BUN 14 07/07/2019   CREATININE 0.63 07/07/2019   BILITOT 0.6 07/07/2019   ALKPHOS 55 07/07/2019   AST 8 (L) 07/07/2019   ALT 8 07/07/2019   PROT 6.2 (L) 07/07/2019   ALBUMIN 3.1 (L) 07/07/2019   CALCIUM 9.0 07/07/2019   ANIONGAP 11 07/07/2019   Lab Results  Component Value Date   CHOL 173 10/29/2017   Lab Results  Component Value Date   HDL 69 10/29/2017   Lab Results  Component Value Date   LDLCALC 90 10/29/2017   Lab Results  Component Value Date   TRIG 69 10/29/2017   Lab Results  Component Value Date   CHOLHDL 2.5 10/29/2017   Lab Results  Component  Value Date   HGBA1C 5.5 02/22/2015      Assessment & Plan:   Problem List Items Addressed This Visit    None    1. Acute otitis media, unspecified otitis media type Patient was encouraged to switch to cefdinir, follow up with ENT - Ambulatory referral to ENT - cefdinir (OMNICEF) 300 MG capsule; Take 1  capsule (300 mg total) by mouth 2 (two) times daily for 10 days.  Dispense: 20 capsule; Refill: 0   I have reviewed the patient's medical history (PMH, PSH, Social History, Family History, Medications, and allergies) , and have been updated if relevant. I spent 20 minutes reviewing chart and  face to face time with patient.     No orders of the defined types were placed in this encounter.   Follow-up: No follow-ups on file.    Loraine Grip Mayers, PA-C

## 2019-10-06 NOTE — Telephone Encounter (Signed)
She was just started on an antibiotic less than 48 hours ago and I would recommend she give it some time to work and if not better we can have her be seen on the walk in provider schedule. Thanks.

## 2019-10-06 NOTE — Telephone Encounter (Signed)
Returned call to patient.  She states that she has been to ER 2 times in one week for a cyst to her left ear.  She has been taking antibiotic Clindamycin since 10/04/19.  She states that she now has ear infections both sides.  She states that she has swollen glands down both sides of her neck. She rates the pain and itching of the inner ears and lymph nodes a 5. Her left outer ear is very swollen and she rates the pain at 6. She state it feels like she has water in her ears bilaterally. She has no fever but state her neck is stiff. She can touch her chin to her chest. Care advice read to patient.  She verbalized understanding. Per protocol patient needs appointment. Patient was warm transferred to office for a possible fit in appointment.   Reason for Disposition . [1] Taking antibiotic > 72 hours (3 days) and [2] pain persists or recurs  Answer Assessment - Initial Assessment Questions 1. ANTIBIOTIC: "What antibiotic are you taking?" "How many times per day?"     clyndamycin 2. ONSET: "When was the antibiotic started?"     10/04/19 3. LOCATION: "Which ear is involved?"     Both ears 4. PAIN: "How bad is the pain?"   (Scale 1-10; mild, moderate or severe)   - MILD (1-3): doesn't interfere with normal activities    - MODERATE (4-7): interferes with normal activities or awakens from sleep    - SEVERE (8-10): excruciating pain, unable to do any normal activities      Itching pain and pressure on left with lymph node swelling 5 outer ear 6 5. FEVER: "Do you have a fever?" If Yes, ask: "What is your temperature, how was it measured, and when did it start?" no  6. DISCHARGE: "Is there any discharge from the ear?"     no 7. OTHER SYMPTOMS: "Do you have any other symptoms?" (e.g., headache, stiff neck, dizziness, vomiting, runny nose)     Stiff neck  8. PREGNANCY: "Is there any chance you are pregnant?" "When was your last menstrual period?"    menopause  Protocols used: EAR - OTITIS MEDIA FOLLOW-UP  CALL-A-AH
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
## 2019-10-08 ENCOUNTER — Ambulatory Visit
Admission: EM | Admit: 2019-10-08 | Discharge: 2019-10-08 | Disposition: A | Discharge status: Home / Self Care | Payer: Medicare Other | Attending: Emergency Medicine | Admitting: Emergency Medicine

## 2019-10-08 ENCOUNTER — Encounter: Payer: Self-pay | Admitting: Emergency Medicine

## 2019-10-08 ENCOUNTER — Other Ambulatory Visit: Payer: Self-pay

## 2019-10-08 DIAGNOSIS — N760 Acute vaginitis: Secondary | ICD-10-CM | POA: Insufficient documentation

## 2019-10-08 DIAGNOSIS — Z113 Encounter for screening for infections with a predominantly sexual mode of transmission: Secondary | ICD-10-CM | POA: Insufficient documentation

## 2019-10-08 DIAGNOSIS — R591 Generalized enlarged lymph nodes: Secondary | ICD-10-CM | POA: Insufficient documentation

## 2019-10-08 DIAGNOSIS — Z114 Encounter for screening for human immunodeficiency virus [HIV]: Secondary | ICD-10-CM | POA: Insufficient documentation

## 2019-10-08 DIAGNOSIS — Z7251 High risk heterosexual behavior: Secondary | ICD-10-CM | POA: Insufficient documentation

## 2019-10-08 MED ORDER — FLUCONAZOLE 200 MG PO TABS: 200.0000 mg | ORAL_TABLET | Freq: Once | ORAL | 0 refills | Status: AC

## 2019-10-08 NOTE — Discharge Instructions (Signed)
Today you received treatment for yeast infection. Testing for chlamydia, gonorrhea, trichomonas is pending: please look for these results on the MyChart app/website.  We will notify you if you are positive and outline treatment at that time.  Important to avoid all forms of sexual intercourse (oral, vaginal, anal) with any/all partners for the next 7 days to avoid spreading/reinfecting. Any/all sexual partners should be notified of testing/treatment today.  Return for persistent/worsening symptoms or if you develop fever, abdominal or pelvic pain, blood in your urine, or are re-exposed to an STI.

## 2019-10-08 NOTE — ED Triage Notes (Signed)
Pt presents to [facility] for assessment of swollen glands around her pubic area, as well as her neck and ears.  Patient states she has been having multipe providers and ER visits, and is taking multiple antibiotics.  Patient is now concerned everyhting might be being caused by STIs.  Would like to have her throat swabbed, her vaginal swab and blood work.  Patient states she had breast cancer earlier this year but "they got it".
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
## 2019-10-08 NOTE — ED Notes (Signed)
Patient able to ambulate independently  

## 2019-10-09 LAB — RPR: RPR Ser Ql: NONREACTIVE

## 2019-10-09 LAB — HIV ANTIBODY (ROUTINE TESTING W REFLEX): HIV Screen 4th Generation wRfx: NONREACTIVE

## 2019-10-11 LAB — CERVICOVAGINAL ANCILLARY ONLY
Chlamydia: NEGATIVE
Comment: NEGATIVE
Comment: NEGATIVE
Comment: NORMAL
Neisseria Gonorrhea: NEGATIVE
Trichomonas: NEGATIVE

## 2019-10-12 ENCOUNTER — Telehealth: Payer: Self-pay | Admitting: Emergency Medicine

## 2019-10-12 DIAGNOSIS — L720 Epidermal cyst: Secondary | ICD-10-CM | POA: Diagnosis not present

## 2019-10-12 DIAGNOSIS — D239 Other benign neoplasm of skin, unspecified: Secondary | ICD-10-CM | POA: Diagnosis not present

## 2019-10-12 DIAGNOSIS — L814 Other melanin hyperpigmentation: Secondary | ICD-10-CM | POA: Diagnosis not present

## 2019-10-12 DIAGNOSIS — L281 Prurigo nodularis: Secondary | ICD-10-CM | POA: Diagnosis not present

## 2019-10-12 DIAGNOSIS — D229 Melanocytic nevi, unspecified: Secondary | ICD-10-CM | POA: Diagnosis not present

## 2019-10-12 DIAGNOSIS — D1801 Hemangioma of skin and subcutaneous tissue: Secondary | ICD-10-CM | POA: Diagnosis not present

## 2019-10-12 NOTE — Telephone Encounter (Signed)
Returned patient's call and patient states she was able to see her results online.  Questions answered, and guidance on establishing new PCP provided.

## 2019-10-13 ENCOUNTER — Ambulatory Visit (HOSPITAL_COMMUNITY): Payer: Medicare Other | Admitting: Licensed Clinical Social Worker

## 2019-10-13 ENCOUNTER — Encounter: Payer: Self-pay | Admitting: Hematology and Oncology

## 2019-10-14 ENCOUNTER — Emergency Department (HOSPITAL_COMMUNITY)
Admission: EM | Admit: 2019-10-14 | Discharge: 2019-10-14 | Disposition: A | Discharge status: Home / Self Care | Payer: Medicare Other | Attending: Emergency Medicine | Admitting: Emergency Medicine

## 2019-10-14 ENCOUNTER — Other Ambulatory Visit: Payer: Self-pay

## 2019-10-14 ENCOUNTER — Encounter (HOSPITAL_COMMUNITY): Payer: Self-pay

## 2019-10-14 DIAGNOSIS — J4521 Mild intermittent asthma with (acute) exacerbation: Secondary | ICD-10-CM | POA: Diagnosis not present

## 2019-10-14 DIAGNOSIS — J449 Chronic obstructive pulmonary disease, unspecified: Secondary | ICD-10-CM | POA: Insufficient documentation

## 2019-10-14 DIAGNOSIS — Z7951 Long term (current) use of inhaled steroids: Secondary | ICD-10-CM | POA: Diagnosis not present

## 2019-10-14 DIAGNOSIS — K0889 Other specified disorders of teeth and supporting structures: Secondary | ICD-10-CM | POA: Insufficient documentation

## 2019-10-14 DIAGNOSIS — Z853 Personal history of malignant neoplasm of breast: Secondary | ICD-10-CM | POA: Diagnosis not present

## 2019-10-14 DIAGNOSIS — Z87891 Personal history of nicotine dependence: Secondary | ICD-10-CM | POA: Diagnosis not present

## 2019-10-14 DIAGNOSIS — R220 Localized swelling, mass and lump, head: Secondary | ICD-10-CM

## 2019-10-14 DIAGNOSIS — M7989 Other specified soft tissue disorders: Secondary | ICD-10-CM | POA: Diagnosis not present

## 2019-10-14 LAB — CBC WITH DIFFERENTIAL/PLATELET
Abs Immature Granulocytes: 0 10*3/uL (ref 0.00–0.07)
Basophils Absolute: 0 10*3/uL (ref 0.0–0.1)
Basophils Relative: 1 %
Eosinophils Absolute: 0.5 10*3/uL (ref 0.0–0.5)
Eosinophils Relative: 8 %
HCT: 39.6 % (ref 36.0–46.0)
Hemoglobin: 13.1 g/dL (ref 12.0–15.0)
Immature Granulocytes: 0 %
Lymphocytes Relative: 35 %
Lymphs Abs: 2.1 10*3/uL (ref 0.7–4.0)
MCH: 31.2 pg (ref 26.0–34.0)
MCHC: 33.1 g/dL (ref 30.0–36.0)
MCV: 94.3 fL (ref 80.0–100.0)
Monocytes Absolute: 0.4 10*3/uL (ref 0.1–1.0)
Monocytes Relative: 6 %
Neutro Abs: 3 10*3/uL (ref 1.7–7.7)
Neutrophils Relative %: 50 %
Platelets: 244 10*3/uL (ref 150–400)
RBC: 4.2 MIL/uL (ref 3.87–5.11)
RDW: 14.8 % (ref 11.5–15.5)
WBC: 6.1 10*3/uL (ref 4.0–10.5)
nRBC: 0 % (ref 0.0–0.2)

## 2019-10-14 LAB — COMPREHENSIVE METABOLIC PANEL
ALT: 18 U/L (ref 0–44)
AST: 16 U/L (ref 15–41)
Albumin: 4.1 g/dL (ref 3.5–5.0)
Alkaline Phosphatase: 53 U/L (ref 38–126)
Anion gap: 9 (ref 5–15)
BUN: 13 mg/dL (ref 6–20)
CO2: 25 mmol/L (ref 22–32)
Calcium: 9.2 mg/dL (ref 8.9–10.3)
Chloride: 108 mmol/L (ref 98–111)
Creatinine, Ser: 0.63 mg/dL (ref 0.44–1.00)
GFR calc Af Amer: >60 mL/min (ref >60)
GFR calc non Af Amer: >60 mL/min (ref >60)
Glucose, Bld: 89 mg/dL (ref 70–99)
Potassium: 4.4 mmol/L (ref 3.5–5.1)
Sodium: 142 mmol/L (ref 135–145)
Total Bilirubin: 0.6 mg/dL (ref 0.3–1.2)
Total Protein: 6.5 g/dL (ref 6.5–8.1)

## 2019-10-14 LAB — SEDIMENTATION RATE: Sed Rate: 5 mm/hr (ref 0–22)

## 2019-10-14 MED ORDER — PREDNISONE 20 MG PO TABS
40.0000 mg | ORAL_TABLET | Freq: Every day | ORAL | 0 refills | Status: DC
Start: 2019-10-14 — End: 2019-10-16

## 2019-10-14 MED ORDER — DIPHENHYDRAMINE HCL 25 MG PO TABS
25.0000 mg | ORAL_TABLET | Freq: Three times a day (TID) | ORAL | 0 refills | Status: DC
Start: 2019-10-14 — End: 2019-10-16

## 2019-10-14 MED ORDER — DIPHENHYDRAMINE HCL 25 MG PO CAPS
25.0000 mg | ORAL_CAPSULE | Freq: Once | ORAL | Status: AC
  Administered 2019-10-14: 25 mg via ORAL
  Filled 2019-10-14: qty 1

## 2019-10-14 MED ORDER — PREDNISONE 20 MG PO TABS
40.0000 mg | ORAL_TABLET | ORAL | Status: AC
  Administered 2019-10-14: 40 mg via ORAL
  Filled 2019-10-14: qty 2

## 2019-10-14 NOTE — ED Notes (Signed)
Reviewed discharge instructions with pt, verbalizes understand
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
## 2019-10-14 NOTE — Discharge Instructions (Signed)
As discussed, your evaluation suggest that your swelling may be due to inflammation, though infection remains a possibility. It is important you take your medication as prescribed, monitor your condition carefully, and do not hesitate to return here if you develop new, concerning changes.  Otherwise, please be sure to follow-up with your oncologist as scheduled next week, and our dental colleagues for additional consideration of possible imaging needs should they be indicated.

## 2019-10-14 NOTE — ED Notes (Signed)
Facial pain continues 6/10

## 2019-10-14 NOTE — ED Triage Notes (Addendum)
Patient states she had a ruptured cyst to the left ear lobe on 10/03/19. Patient has been taking clindamycin which she has completed. Patient states she is currently cefdinir. Patient states she has right facial swelling, gum swelling, and swelling of her upper palate.  Patient states, "I have no sensation in my teeth."

## 2019-10-15 ENCOUNTER — Encounter: Payer: Self-pay | Admitting: Hematology and Oncology

## 2019-10-16 ENCOUNTER — Other Ambulatory Visit: Payer: Self-pay

## 2019-10-16 ENCOUNTER — Encounter (HOSPITAL_COMMUNITY): Payer: Self-pay | Admitting: Student

## 2019-10-16 ENCOUNTER — Emergency Department (HOSPITAL_COMMUNITY)
Admission: EM | Admit: 2019-10-16 | Discharge: 2019-10-16 | Disposition: A | Discharge status: Home / Self Care | Payer: Medicare Other | Attending: Emergency Medicine | Admitting: Emergency Medicine

## 2019-10-16 DIAGNOSIS — Z79899 Other long term (current) drug therapy: Secondary | ICD-10-CM | POA: Diagnosis not present

## 2019-10-16 DIAGNOSIS — Z87891 Personal history of nicotine dependence: Secondary | ICD-10-CM | POA: Diagnosis not present

## 2019-10-16 DIAGNOSIS — R208 Other disturbances of skin sensation: Secondary | ICD-10-CM

## 2019-10-16 DIAGNOSIS — R6889 Other general symptoms and signs: Secondary | ICD-10-CM

## 2019-10-16 DIAGNOSIS — J45909 Unspecified asthma, uncomplicated: Secondary | ICD-10-CM | POA: Diagnosis not present

## 2019-10-16 DIAGNOSIS — J449 Chronic obstructive pulmonary disease, unspecified: Secondary | ICD-10-CM | POA: Insufficient documentation

## 2019-10-16 DIAGNOSIS — R202 Paresthesia of skin: Secondary | ICD-10-CM | POA: Diagnosis not present

## 2019-10-16 DIAGNOSIS — K089 Disorder of teeth and supporting structures, unspecified: Secondary | ICD-10-CM | POA: Diagnosis not present

## 2019-10-16 DIAGNOSIS — Z7952 Long term (current) use of systemic steroids: Secondary | ICD-10-CM | POA: Diagnosis not present

## 2019-10-16 DIAGNOSIS — G519 Disorder of facial nerve, unspecified: Secondary | ICD-10-CM | POA: Diagnosis not present

## 2019-10-16 DIAGNOSIS — R209 Unspecified disturbances of skin sensation: Secondary | ICD-10-CM | POA: Diagnosis not present

## 2019-10-16 DIAGNOSIS — R590 Localized enlarged lymph nodes: Secondary | ICD-10-CM | POA: Diagnosis not present

## 2019-10-16 DIAGNOSIS — Z853 Personal history of malignant neoplasm of breast: Secondary | ICD-10-CM | POA: Diagnosis not present

## 2019-10-16 DIAGNOSIS — G501 Atypical facial pain: Secondary | ICD-10-CM | POA: Insufficient documentation

## 2019-10-16 MED ORDER — PREDNISONE 10 MG PO TABS: 20.0000 mg | ORAL_TABLET | Freq: Every day | ORAL | 0 refills | Status: DC

## 2019-10-16 MED ORDER — DIPHENHYDRAMINE HCL 25 MG PO TABS
25.0000 mg | ORAL_TABLET | Freq: Three times a day (TID) | ORAL | 0 refills | Status: DC
Start: 2019-10-16 — End: 2019-12-09

## 2019-10-16 MED ORDER — DOXYCYCLINE HYCLATE 100 MG PO CAPS
100.0000 mg | ORAL_CAPSULE | Freq: Two times a day (BID) | ORAL | 0 refills | Status: DC
Start: 2019-10-16 — End: 2019-12-05
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
## 2019-10-16 NOTE — ED Triage Notes (Addendum)
Patient reports ruptured left ear cyst 6/28, had antibiotics, cyst developed on right side, went to UC, another antibiotic was added -patient says she is losing sensitivity in her teeth, developing facial swelling and sinuses are swollen. Patient was started on prednisone on Friday, and says now her face is numb, neck is numb, shoulder is numb - patient says she needs MRI but ED was not able to order one.  Patient says ED MD told her she needed the MRI but he couldn't order it because hospital limits his ability to do his job. Patient says ED MD told patient to message her oncologist and beg for MRI. Patient then states she feels she may have a mold infection and that someone "darn better give me a diagnosis today or I am calling an attorney in the morning".  Patient then reiterates that she has a mold problem at her apartment. States it is really bad in bathroom, and when she stands in the bathroom mirror she can see her face swelling.

## 2019-10-16 NOTE — ED Notes (Signed)
PT was informed that it is not recommended to eat or drink until doctor evaluation, PT expressed "I haven't eaten in 5 hours and I know I'm not going to surgery, because they don't care about me." PT was also informed by front desk that it is not advised to eat or drink due to PT was at the vending machine.

## 2019-10-16 NOTE — Discharge Instructions (Signed)
You were seen in the emergency department today for numbness to your teeth as well as facial problems. We are sending him with a prescription for doxycycline to take twice per day for infection as well as prednisone to take 20 mg/day after you complete your 40 mg/day course.  We have prescribed you new medication(s) today. Discuss the medications prescribed today with your pharmacist as they can have adverse effects and interactions with your other medicines including over the counter and prescribed medications. Seek medical evaluation if you start to experience new or abnormal symptoms after taking one of these medicines, seek care immediately if you start to experience difficulty breathing, feeling of your throat closing, facial swelling, or rash as these could be indications of a more serious allergic reaction  We placed an ambulatory referral to ENT, Dr. Lucia Gaskins, for follow-up.  Please see Dr. Lucia Gaskins or your primary care provider as soon as possible.  Return to the ER for new or worsening symptoms including but not limited to spreading numbness, increased pain, fever, chills, redness to your face, trouble swallowing, trouble breathing, inability to keep fluids down, or any other concerns.

## 2019-10-17 ENCOUNTER — Other Ambulatory Visit: Payer: Self-pay

## 2019-10-17 ENCOUNTER — Emergency Department (HOSPITAL_COMMUNITY)
Admission: EM | Admit: 2019-10-17 | Discharge: 2019-10-17 | Disposition: A | Discharge status: Home / Self Care | Payer: Medicare Other | Attending: Emergency Medicine | Admitting: Emergency Medicine

## 2019-10-17 ENCOUNTER — Encounter (HOSPITAL_COMMUNITY): Payer: Self-pay

## 2019-10-17 DIAGNOSIS — R220 Localized swelling, mass and lump, head: Secondary | ICD-10-CM | POA: Insufficient documentation

## 2019-10-17 DIAGNOSIS — J45901 Unspecified asthma with (acute) exacerbation: Secondary | ICD-10-CM | POA: Insufficient documentation

## 2019-10-17 DIAGNOSIS — R208 Other disturbances of skin sensation: Secondary | ICD-10-CM

## 2019-10-17 DIAGNOSIS — Z87891 Personal history of nicotine dependence: Secondary | ICD-10-CM | POA: Insufficient documentation

## 2019-10-17 DIAGNOSIS — J449 Chronic obstructive pulmonary disease, unspecified: Secondary | ICD-10-CM | POA: Insufficient documentation

## 2019-10-17 DIAGNOSIS — R209 Unspecified disturbances of skin sensation: Secondary | ICD-10-CM | POA: Insufficient documentation

## 2019-10-17 DIAGNOSIS — Z7951 Long term (current) use of inhaled steroids: Secondary | ICD-10-CM | POA: Insufficient documentation

## 2019-10-17 DIAGNOSIS — R456 Violent behavior: Secondary | ICD-10-CM | POA: Diagnosis not present

## 2019-10-17 DIAGNOSIS — R202 Paresthesia of skin: Secondary | ICD-10-CM | POA: Diagnosis present

## 2019-10-17 DIAGNOSIS — C50012 Malignant neoplasm of nipple and areola, left female breast: Secondary | ICD-10-CM | POA: Insufficient documentation

## 2019-10-17 DIAGNOSIS — R451 Restlessness and agitation: Secondary | ICD-10-CM | POA: Diagnosis not present

## 2019-10-17 DIAGNOSIS — G518 Other disorders of facial nerve: Secondary | ICD-10-CM | POA: Diagnosis not present

## 2019-10-17 DIAGNOSIS — R6889 Other general symptoms and signs: Secondary | ICD-10-CM

## 2019-10-17 NOTE — ED Notes (Addendum)
Pt called department several times PTA. Loud and threatening staff with her attorney. Pt states she has been seen 5 times and can not get an referral. Demanding emergency referral. Pt was given a referral to Dr Lucia Gaskins who was not on call. Dr Lucia Gaskins would not make an appointment. She was asked to please calm down and not yell at staff. Explained we are glad to see her any time for evalution and follow up referral. Pt arrived in ED, yelling at registration/screeners. Upon writers arrival she was speaking to [facility], they told her if she is able to get "an emergency referral" they can see her today. Writer informed pt she will have to register and be seen. Pt demanding emergency referral, explained the doctor has to make that decision and cannot until he/she sees her. After a few minutes of pt continuing to talk loudly and threaten staff with law suits she was taken back for triage.
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
## 2019-10-17 NOTE — ED Notes (Signed)
Pt incredibly rude and hostile towards this RN as this RN was going over discharge papers. Pt reports that nobody is doing anything for her. Went over paperwork, showing pt the referral. This RN and Aurora Mask, NT attempted to get discharge vital signs on patient and she ripped off blood pressure cuff, threw it on the chair, and said "maybe it's the doctors fault". Pt refusing vital signs at discharge. Pt denies pain, says she is only experiencing numbness.

## 2019-10-17 NOTE — Discharge Instructions (Signed)
Please plan on following up with [facility] at the end of the day today to find out their availability for your upcoming appointment.  You can always return to the emergency department with new or worsening symptoms.  It was a pleasure to meet you.

## 2019-10-17 NOTE — ED Triage Notes (Signed)
Patient was seen yesterday with c/o facial swelling and palate swelling. Patient very upset about referral that she says she was given yesterday.  Patient states multiple complaints of facial numbness, sinus problems, dental issues, etc.

## 2019-10-18 DIAGNOSIS — K122 Cellulitis and abscess of mouth: Secondary | ICD-10-CM | POA: Diagnosis not present

## 2019-10-18 DIAGNOSIS — G500 Trigeminal neuralgia: Secondary | ICD-10-CM | POA: Diagnosis not present

## 2019-10-18 DIAGNOSIS — K0510 Chronic gingivitis, plaque induced: Secondary | ICD-10-CM | POA: Diagnosis not present

## 2019-10-18 DIAGNOSIS — J320 Chronic maxillary sinusitis: Secondary | ICD-10-CM | POA: Diagnosis not present

## 2019-10-19 ENCOUNTER — Inpatient Hospital Stay: Payer: Medicare Other | Attending: Hematology and Oncology | Admitting: Adult Health

## 2019-10-19 ENCOUNTER — Other Ambulatory Visit: Payer: Self-pay

## 2019-10-19 VITALS — BP 128/89 | HR 93 | Temp 98.9°F | Resp 18 | Ht 68.0 in | Wt 139.2 lb

## 2019-10-19 DIAGNOSIS — D0511 Intraductal carcinoma in situ of right breast: Secondary | ICD-10-CM | POA: Insufficient documentation

## 2019-10-19 DIAGNOSIS — J449 Chronic obstructive pulmonary disease, unspecified: Secondary | ICD-10-CM | POA: Diagnosis not present

## 2019-10-19 DIAGNOSIS — K219 Gastro-esophageal reflux disease without esophagitis: Secondary | ICD-10-CM | POA: Insufficient documentation

## 2019-10-19 DIAGNOSIS — Z170 Estrogen receptor positive status [ER+]: Secondary | ICD-10-CM | POA: Insufficient documentation

## 2019-10-19 DIAGNOSIS — Z87891 Personal history of nicotine dependence: Secondary | ICD-10-CM | POA: Diagnosis not present
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
## 2019-10-20 ENCOUNTER — Telehealth: Payer: Self-pay

## 2019-10-20 ENCOUNTER — Telehealth: Payer: Self-pay | Admitting: Adult Health

## 2019-10-20 ENCOUNTER — Encounter: Payer: Self-pay | Admitting: Adult Health

## 2019-10-20 ENCOUNTER — Ambulatory Visit (HOSPITAL_COMMUNITY): Payer: Medicare Other | Admitting: Licensed Clinical Social Worker

## 2019-10-20 NOTE — Telephone Encounter (Signed)
Wonderful. Thank you.

## 2019-10-20 NOTE — Telephone Encounter (Signed)
No 7/14 los. No changes made to pt's schedule.  °

## 2019-10-20 NOTE — Assessment & Plan Note (Signed)
Right lumpectomy: Dr. 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  Adjuvant radiation therapy: Patient discussed with Dr. Sondra Come and agreed to radiation. 2.  Opted against anti estrogen therapy.    _____________________________________________________________________________________________________  I reviewed with Catherine Olsen that her breast cancer was non invasive.  This means that she had Stage 0 breast cancer.  Her breast cancer did not invade the surrounding tissue, so it is highly unlikely that her oral issue has anything to do with her previous cancer diagnosis. She is taking the correct steps in her care, by securing an ENT appointment, and by following up about getting her imaging promptly as her symptoms are worsening.  I let Catherine Olsen know that we would be happy to help in any way that we can.  I recommended that she contact Catherine Olsen office today, and let her know that the MRI wasn't set up until August.  I let her know that she could likely call and get it moved up.  If not, I let Catherine Olsen know, that we can always see what [facility] or radiology at [facility] might be able to accommodate, and go from there as far as getting the scans switched.  Catherine Olsen let me know that she would do what was recommended.  I will have my nurse call and check on her today.

## 2019-10-20 NOTE — Telephone Encounter (Signed)
Spoke with patient to see if she had called Dr. Aron Baba office.  Patient reported that she is at [facility] at the moment and will call as soon as she is back at home.  She says that as soon as she gets an MRI appt, she will send a message to NP.

## 2019-10-21 ENCOUNTER — Telehealth: Payer: Self-pay

## 2019-10-21 DIAGNOSIS — J0100 Acute maxillary sinusitis, unspecified: Secondary | ICD-10-CM | POA: Diagnosis not present

## 2019-10-21 DIAGNOSIS — J3489 Other specified disorders of nose and nasal sinuses: Secondary | ICD-10-CM | POA: Diagnosis not present

## 2019-10-21 DIAGNOSIS — J320 Chronic maxillary sinusitis: Secondary | ICD-10-CM | POA: Diagnosis not present

## 2019-10-21 DIAGNOSIS — R600 Localized edema: Secondary | ICD-10-CM | POA: Diagnosis not present

## 2019-10-21 DIAGNOSIS — J321 Chronic frontal sinusitis: Secondary | ICD-10-CM | POA: Diagnosis not present

## 2019-10-21 NOTE — Telephone Encounter (Signed)
Pt called today and is extremely upset, as she is having difficulty expediting her maxillofacial MRI. Pt states she spoke with Wilber Bihari, NP on 10/10/19 and was told if she is not able to expedite it for sooner than 11/06/19, we can help her to get one sooner. I explained this process to the pt and I informed pt I would let Mendel Ryder know her concern. Pt stated, "[facility] needs to get it together and help me. I feel neglected and they should have ordered the MRI in the ED. If I have an infection like I did before, it's not going to be pretty." This LPN tried to alleviate the situation and explain that I would route this to  and someone would be in contact with her on Monday about where to go moving forward. Pt verbalized thanks and understanding.

## 2019-10-22 NOTE — Telephone Encounter (Signed)
Please read my note.  Did the patient talk to Dr. Aron Baba office about the delay in getting the MRI scheduled as I had suggested?  Can someone please reach out to Dr. Aron Baba office and discuss this with her staff?  We can certainly order here, if that will be sooner.  We just need to call her ENT and make sure that we are in communication.    Thank you,   Mendel Ryder

## 2019-10-24 ENCOUNTER — Ambulatory Visit (INDEPENDENT_AMBULATORY_CARE_PROVIDER_SITE_OTHER): Payer: Medicare Other | Admitting: Otolaryngology

## 2019-10-24 NOTE — Telephone Encounter (Signed)
Called and spoke with Dr Aron Baba office about MRI. Jodi Mourning, Dr Lin's nurse states that she spoke with pt Friday 7/16 and provided pt with several places to try and have MRI moved up sooner as nurses are not able to schedule the MRI. Jodi Mourning also states that sinus culture resulted positive for staphylococcus and pt was called in an Rx for Levaquin. Attempted to call pt to discuss this with her, no answer, LVM to return my call.

## 2019-10-25 ENCOUNTER — Telehealth: Payer: Self-pay

## 2019-10-25 ENCOUNTER — Encounter: Payer: Self-pay | Admitting: Adult Health

## 2019-10-25 NOTE — Telephone Encounter (Signed)
This is not an oncology issue.  She should improve with antibiotics, and then be able to wait until 8/1 for the MRI

## 2019-10-25 NOTE — Telephone Encounter (Signed)
Called and spoke with pt. She was not able to get an earlier MRI appt than 8/1 with [facility]. She is positive for staph in her sinus cavity and is taking Levaquin as prescribed by ENT through [facility]. Please advise.

## 2019-10-26 ENCOUNTER — Other Ambulatory Visit: Payer: Self-pay

## 2019-10-26 ENCOUNTER — Telehealth: Payer: Self-pay

## 2019-10-26 ENCOUNTER — Ambulatory Visit
Admission: RE | Admit: 2019-10-26 | Discharge: 2019-10-26 | Disposition: A | Discharge status: Home / Self Care | Payer: Medicare Other | Source: Ambulatory Visit | Attending: Surgery | Admitting: Surgery

## 2019-10-26 ENCOUNTER — Other Ambulatory Visit: Payer: Self-pay | Admitting: Surgery

## 2019-10-26 DIAGNOSIS — D0591 Unspecified type of carcinoma in situ of right breast: Secondary | ICD-10-CM

## 2019-10-26 DIAGNOSIS — D0511 Intraductal carcinoma in situ of right breast: Secondary | ICD-10-CM

## 2019-10-26 DIAGNOSIS — R922 Inconclusive mammogram: Secondary | ICD-10-CM | POA: Diagnosis not present

## 2019-10-26 DIAGNOSIS — N6489 Other specified disorders of breast: Secondary | ICD-10-CM | POA: Diagnosis not present

## 2019-10-26 NOTE — Telephone Encounter (Signed)
Received call from pt concerned about status of possibly having MRI. Explained to pt that Mendel Ryder, NP would advise on what to do and I will call pt back as soon as I hear. Pt is currently scheduled for MRI with [facility] 8/1 but is anxious to have one sooner. Pt currently being tx w/ Levaquin d/t sinus culture positive for Staphylococcus from [facility].

## 2019-10-27 ENCOUNTER — Encounter: Payer: Self-pay | Admitting: Family Medicine

## 2019-10-27 ENCOUNTER — Ambulatory Visit (HOSPITAL_COMMUNITY): Payer: Medicare Other | Admitting: Licensed Clinical Social Worker

## 2019-10-27 ENCOUNTER — Other Ambulatory Visit (HOSPITAL_COMMUNITY)
Admission: RE | Admit: 2019-10-27 | Discharge: 2019-10-27 | Disposition: A | Discharge status: Home / Self Care | Payer: Medicare Other | Source: Ambulatory Visit | Attending: Family Medicine | Admitting: Family Medicine

## 2019-10-27 ENCOUNTER — Ambulatory Visit: Payer: Medicare Other | Attending: Family Medicine | Admitting: Family Medicine

## 2019-10-27 ENCOUNTER — Other Ambulatory Visit: Payer: Self-pay

## 2019-10-27 VITALS — BP 111/75 | HR 68 | Ht 68.0 in | Wt 139.0 lb

## 2019-10-27 DIAGNOSIS — Z87891 Personal history of nicotine dependence: Secondary | ICD-10-CM | POA: Diagnosis not present

## 2019-10-27 DIAGNOSIS — Z124 Encounter for screening for malignant neoplasm of cervix: Secondary | ICD-10-CM | POA: Insufficient documentation

## 2019-10-27 DIAGNOSIS — M419 Scoliosis, unspecified: Secondary | ICD-10-CM | POA: Insufficient documentation

## 2019-10-27 DIAGNOSIS — D0511 Intraductal carcinoma in situ of right breast: Secondary | ICD-10-CM | POA: Diagnosis not present

## 2019-10-27 DIAGNOSIS — Z0000 Encounter for general adult medical examination without abnormal findings: Secondary | ICD-10-CM | POA: Diagnosis present

## 2019-10-27 DIAGNOSIS — M797 Fibromyalgia: Secondary | ICD-10-CM | POA: Insufficient documentation

## 2019-10-27 DIAGNOSIS — R87612 Low grade squamous intraepithelial lesion on cytologic smear of cervix (LGSIL): Secondary | ICD-10-CM | POA: Diagnosis not present

## 2019-10-27 DIAGNOSIS — J449 Chronic obstructive pulmonary disease, unspecified: Secondary | ICD-10-CM | POA: Diagnosis not present

## 2019-10-27 DIAGNOSIS — F319 Bipolar disorder, unspecified: Secondary | ICD-10-CM | POA: Insufficient documentation

## 2019-10-27 DIAGNOSIS — F419 Anxiety disorder, unspecified: Secondary | ICD-10-CM | POA: Insufficient documentation

## 2019-10-27 DIAGNOSIS — Z853 Personal history of malignant neoplasm of breast: Secondary | ICD-10-CM | POA: Diagnosis not present

## 2019-10-27 DIAGNOSIS — Z91128 Patient's intentional underdosing of medication regimen for other reason: Secondary | ICD-10-CM | POA: Insufficient documentation

## 2019-10-27 DIAGNOSIS — Z1151 Encounter for screening for human papillomavirus (HPV): Secondary | ICD-10-CM | POA: Diagnosis not present

## 2019-10-27 DIAGNOSIS — M1990 Unspecified osteoarthritis, unspecified site: Secondary | ICD-10-CM | POA: Insufficient documentation

## 2019-10-27 DIAGNOSIS — Z79899 Other long term (current) drug therapy: Secondary | ICD-10-CM | POA: Insufficient documentation

## 2019-10-27 NOTE — Patient Instructions (Signed)
Health Maintenance, Female Adopting a healthy lifestyle and getting preventive care are important in promoting health and wellness. Ask your health care provider about:  The right schedule for you to have regular tests and exams.  Things you can do on your own to prevent diseases and keep yourself healthy. What should I know about diet, weight, and exercise? Eat a healthy diet   Eat a diet that includes plenty of vegetables, fruits, low-fat dairy products, and lean protein.  Do not eat a lot of foods that are high in solid fats, added sugars, or sodium. Maintain a healthy weight Body mass index (BMI) is used to identify weight problems. It estimates body fat based on height and weight. Your health care provider can help determine your BMI and help you achieve or maintain a healthy weight. Get regular exercise Get regular exercise. This is one of the most important things you can do for your health. Most adults should:  Exercise for at least 150 minutes each week. The exercise should increase your heart rate and make you sweat (moderate-intensity exercise).  Do strengthening exercises at least twice a week. This is in addition to the moderate-intensity exercise.  Spend less time sitting. Even light physical activity can be beneficial. Watch cholesterol and blood lipids Have your blood tested for lipids and cholesterol at 48 years of age, then have this test every 5 years. Have your cholesterol levels checked more often if:  Your lipid or cholesterol levels are high.  You are older than 48 years of age.  You are at high risk for heart disease. What should I know about cancer screening? Depending on your health history and family history, you may need to have cancer screening at various ages. This may include screening for:  Breast cancer.  Cervical cancer.  Colorectal cancer.  Skin cancer.  Lung cancer. What should I know about heart disease, diabetes, and high blood  pressure? Blood pressure and heart disease  High blood pressure causes heart disease and increases the risk of stroke. This is more likely to develop in people who have high blood pressure readings, are of African descent, or are overweight.  Have your blood pressure checked: ? Every 3-5 years if you are 18-39 years of age. ? Every year if you are 40 years old or older. Diabetes Have regular diabetes screenings. This checks your fasting blood sugar level. Have the screening done:  Once every three years after age 40 if you are at a normal weight and have a low risk for diabetes.  More often and at a younger age if you are overweight or have a high risk for diabetes. What should I know about preventing infection? Hepatitis B If you have a higher risk for hepatitis B, you should be screened for this virus. Talk with your health care provider to find out if you are at risk for hepatitis B infection. Hepatitis C Testing is recommended for:  Everyone born from 1945 through 1965.  Anyone with known risk factors for hepatitis C. Sexually transmitted infections (STIs)  Get screened for STIs, including gonorrhea and chlamydia, if: ? You are sexually active and are younger than 48 years of age. ? You are older than 48 years of age and your health care provider tells you that you are at risk for this type of infection. ? Your sexual activity has changed since you were last screened, and you are at increased risk for chlamydia or gonorrhea. Ask your health care provider if   you are at risk.  Ask your health care provider about whether you are at high risk for HIV. Your health care provider may recommend a prescription medicine to help prevent HIV infection. If you choose to take medicine to prevent HIV, you should first get tested for HIV. You should then be tested every 3 months for as long as you are taking the medicine. Pregnancy  If you are about to stop having your period (premenopausal) and  you may become pregnant, seek counseling before you get pregnant.  Take 400 to 800 micrograms (mcg) of folic acid every day if you become pregnant.  Ask for birth control (contraception) if you want to prevent pregnancy. Osteoporosis and menopause Osteoporosis is a disease in which the bones lose minerals and strength with aging. This can result in bone fractures. If you are 65 years old or older, or if you are at risk for osteoporosis and fractures, ask your health care provider if you should:  Be screened for bone loss.  Take a calcium or vitamin D supplement to lower your risk of fractures.  Be given hormone replacement therapy (HRT) to treat symptoms of menopause. Follow these instructions at home: Lifestyle  Do not use any products that contain nicotine or tobacco, such as cigarettes, e-cigarettes, and chewing tobacco. If you need help quitting, ask your health care provider.  Do not use street drugs.  Do not share needles.  Ask your health care provider for help if you need support or information about quitting drugs. Alcohol use  Do not drink alcohol if: ? Your health care provider tells you not to drink. ? You are pregnant, may be pregnant, or are planning to become pregnant.  If you drink alcohol: ? Limit how much you use to 0-1 drink a day. ? Limit intake if you are breastfeeding.  Be aware of how much alcohol is in your drink. In the [place] one drink equals one 12 oz bottle of beer (355 mL), one 5 oz glass of wine (148 mL), or one 1 oz glass of hard liquor (44 mL). General instructions  Schedule regular health, dental, and eye exams.  Stay current with your vaccines.  Tell your health care provider if: ? You often feel depressed. ? You have ever been abused or do not feel safe at home. Summary  Adopting a healthy lifestyle and getting preventive care are important in promoting health and wellness.  Follow your health care provider's instructions about healthy  diet, exercising, and getting tested or screened for diseases.  Follow your health care provider's instructions on monitoring your cholesterol and blood pressure. This information is not intended to replace advice given to you by your health care provider. Make sure you discuss any questions you have with your health care provider. Document Revised: 03/17/2018 Document Reviewed: 03/17/2018 Elsevier Patient Education  2020 Elsevier Inc.  

## 2019-10-27 NOTE — Progress Notes (Signed)
Subjective:   Catherine Olsen is a 49 y.o. female patient of Dr Chapman Fitch who presents for Medicare Annual (Subsequent) preventive examination. 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 Lucia Gaskins: Intermediate grade DCIS 0.2 cm, 0.1 cm from anterior margin, LCIS, ER 95%, PR 30%  Tis NX stage 0 Plans for adjuvant radiation therapy; she opted against antiestrogen therapy per oncology note.  Diagnostic breast mammogram and right breast ultrasound from yesterday revealed no findings concerning for malignancy, 6 months follow-up diagnostic mammogram recommended.  Review of Systems    General: negative for fever, weight loss, appetite change Eyes: no visual symptoms. ENT:+ ear symptoms, no sinus tenderness, no nasal congestion or sore throat. Neck: no pain  Respiratory: no wheezing, shortness of breath, cough Cardiovascular: no chest pain, no dyspnea on exertion, no pedal edema, no orthopnea. Gastrointestinal: no abdominal pain, no diarrhea, no constipation Genito-Urinary: no urinary frequency, no dysuria, no polyuria. Hematologic: no bruising Endocrine: no cold or heat intolerance Neurological: no headaches, no seizures, no tremors Musculoskeletal: no joint pains, no joint swelling Skin: no pruritus, no rash. Psychological: no depression, no anxiety,          Objective:    Today's Vitals   10/27/19 1036  BP: 111/75  Pulse: 68  SpO2: 100%  Weight: 139 lb (63 kg)  Height: 5\' 8"  (1.727 m)   Body mass index is 21.13 kg/m.  Advanced Directives 10/27/2019 10/17/2019 10/14/2019 10/04/2019 10/03/2019 08/03/2019 07/26/2019  Does Patient Have a Medical Advance Directive? No No No No No No No  Would patient like information on creating a medical advance directive? - No - Patient declined No - Patient declined No - Patient declined - No - Patient declined No - Patient declined  Pre-existing out of facility DNR order (yellow form or  pink MOST form) - - - - - - -  Some encounter information is confidential and restricted. Go to Review Flowsheets activity to see all data.    Current Medications (verified) Outpatient Encounter Medications as of 10/27/2019  Medication Sig  . calcium-vitamin D (OSCAL WITH D) 500-200 MG-UNIT tablet Take 1 tablet by mouth.  . cetirizine (ZYRTEC) 10 MG tablet Take 10 mg by mouth daily.  Marland Kitchen EPINEPHrine 0.3 mg/0.3 mL IJ SOAJ injection Inject 0.3 mLs into the skin once.  Marland Kitchen ibuprofen (ADVIL,MOTRIN) 800 MG tablet TAKE 1 TABLET(800 MG) BY MOUTH EVERY 8 HOURS AS NEEDED (Patient taking differently: 600-800 mg as needed. )  . magnesium oxide (MAG-OX) 400 MG tablet Take 400 mg by mouth daily.  . temazepam (RESTORIL) 15 MG capsule Take 15-45 mg by mouth at bedtime.  Marland Kitchen albuterol (VENTOLIN HFA) 108 (90 Base) MCG/ACT inhaler Inhale 2 puffs into the lungs every 6 (six) hours as needed for wheezing or shortness of breath.  . Ascorbic Acid (VITAMIN C) 1000 MG tablet Take 1,000 mg by mouth daily. (Patient not taking: Reported on 10/27/2019)  . clonazePAM (KLONOPIN) 1 MG tablet Take 1 mg by mouth 4 (four) times daily as needed. (Patient not taking: Reported on 10/27/2019)  . diphenhydrAMINE (BENADRYL) 25 MG tablet Take 1 tablet (25 mg total) by mouth 3 (three) times daily. Take one tablet three times daily for two days (Patient not taking: Reported on 10/27/2019)  . doxycycline (VIBRAMYCIN) 100 MG capsule Take 1 capsule (100 mg total) by mouth 2 (two) times daily. (Patient not taking: Reported on 10/27/2019)  . nicotine (NICODERM CQ - DOSED IN  MG/24 HOURS) 21 mg/24hr patch 21 mg daily. (Patient not taking: Reported on 10/19/2019)  . predniSONE (DELTASONE) 10 MG tablet Take 2 tablets (20 mg total) by mouth daily. (Patient not taking: Reported on 10/27/2019)   No facility-administered encounter medications on file as of 10/27/2019.    Allergies (verified) Other, Prednisone, Sulfamethoxazole, Oxycodone, Abilify  [aripiprazole], Celebrex [celecoxib], Penicillins, Tylenol [acetaminophen], Percocet [oxycodone-acetaminophen], Sulfa antibiotics, and Sulfites   History: Past Medical History:  Diagnosis Date  . Abnormal uterine bleeding (AUB) 06/25/2009   Qualifier: Diagnosis of  By: Cathren Laine MD, Ankit    . Allergy   . Anxiety   . Arthritis    hands, lower back, knees  . Asthma   . Bipolar 1 disorder ([place])   . Breast cancer (HCC)    stage 0  . COPD (chronic obstructive pulmonary disease) ([place])   . Depression   . Endometriosis   . Fibromyalgia   . GERD (gastroesophageal reflux disease)   . Headache(784.0)    otc med prn  . Migraine   . Pituitary tumor    microadenoma  . PONV (postoperative nausea and vomiting)   . PTSD (post-traumatic stress disorder)   . Scoliosis   . Termination of pregnancy    x 2 at age 13 and 49 yrs old  . Tobacco abuse 03/04/2015   Past Surgical History:  Procedure Laterality Date  . BREAST BIOPSY Right 05/19/2007  . BREAST BIOPSY Right 06/02/2007  . BREAST BIOPSY  06/29/2019  . BREAST LUMPECTOMY Right 07/21/2019  . BREAST LUMPECTOMY WITH RADIOACTIVE SEED LOCALIZATION Right 07/21/2019   Procedure: RIGHT BREAST LUMPECTOMY WITH RADIOACTIVE SEED LOCALIZATION;  Surgeon: Alphonsa Overall, MD;  Location: [facility];  Service: General;  Laterality: Right;  . BREAST SURGERY    . DILATION AND CURETTAGE OF UTERUS    . FACIAL COSMETIC SURGERY     right cheek  . LAPAROSCOPY Right 11/11/2013   Procedure: LAPAROSCOPY OPERATIVE with  Drainage of  RIGHT Ovarian ENDOMETRIOMA;  Surgeon: Elveria Royals, MD;  Location: [facility];  Service: Gynecology;  Laterality: Right;  . NASAL ENDOSCOPY     said it showed some acid reflux from ENT  . NOSE SURGERY     rhinoplasty at age 34 yrs  . WISDOM TOOTH EXTRACTION     Family History  Problem Relation Age of Onset  . Diabetes Mother   . Hypertension Mother   . Stroke Mother 54       CVA  . Mental illness Mother        no  diagnosis; personality disorder  . Hyperlipidemia Father   . Hypertension Father   . COPD Father   . Alpha-1 antitrypsin deficiency Father   . Asthma Father   . Alpha-1 antitrypsin deficiency Brother   . Asthma Brother   . Diabetes Maternal Grandmother   . Heart disease Maternal Grandmother   . Hyperlipidemia Maternal Grandmother   . Hypertension Maternal Grandmother   . Mental illness Maternal Grandmother   . Heart disease Maternal Grandfather   . Hyperlipidemia Maternal Grandfather   . Hypertension Maternal Grandfather   . Asthma Maternal Grandfather   . Heart disease Paternal Grandfather   . Hyperlipidemia Paternal Grandfather   . Hypertension Paternal Grandfather   . Stroke Paternal Grandfather   . Alzheimer's disease Paternal Grandmother   . Allergic rhinitis Neg Hx   . Angioedema Neg Hx   . Eczema Neg Hx   . Urticaria Neg Hx   . Immunodeficiency Neg Hx   .  Colon cancer Neg Hx   . Esophageal cancer Neg Hx    Social History   Socioeconomic History  . Marital status: Divorced    Spouse name: Not on file  . Number of children: 0  . Years of education: Not on file  . Highest education level: Bachelor's degree (e.g., BA, AB, BS)  Occupational History  . Occupation: disability    Comment: mental illness  Tobacco Use  . Smoking status: Former Smoker    Packs/day: 2.00    Years: 29.00    Pack years: 58.00    Types: Cigarettes    Quit date: 10/08/2019    Years since quitting: 0.0  . Smokeless tobacco: Never Used  Vaping Use  . Vaping Use: Former  Substance and Sexual Activity  . Alcohol use: Not Currently  . Drug use: Not Currently    Types: "Crack" cocaine    Comment: 07/19/2019  . Sexual activity: Not Currently    Comment: abortion at 21 and 7yrs. of age   Other Topics Concern  . Not on file  Social History Narrative   Marital status: divorced; not dating      Children: none      Lives: alone with mom      Employment: disability for mental illness in 1997.   Hospitalizations x 9 in past.      Tobacco: 1 ppd x since age 36. Decreasing in 2018.      Alcohol: socially; beers.      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      Exercise: yoga daily; exercise daily.      ADLs: independent with ADLs; no car; depends on others for transportation.      Patient is right-handed. She is divorced and lives with her mother. She drinks 2-3 cups of 1/2 caffeine coffe a day. She walks occasionally for exercise.   Social Determinants of Health   Financial Resource Strain:   . Difficulty of Paying Living Expenses:   Food Insecurity:   . Worried About Charity fundraiser in the Last Year:   . Arboriculturist in the Last Year:   Transportation Needs:   . Film/video editor (Medical):   Marland Kitchen Lack of Transportation (Non-Medical):   Physical Activity:   . Days of Exercise per Week:   . Minutes of Exercise per Session:   Stress:   . Feeling of Stress :   Social Connections:   . Frequency of Communication with Friends and Family:   . Frequency of Social Gatherings with Friends and Family:   . Attends Religious Services:   . Active Member of Clubs or Organizations:   . Attends Archivist Meetings:   Marland Kitchen Marital Status:     Tobacco Counseling Counseling given: Not Answered   Clinical Intake:  Pre-visit preparation completed: Yes  Pain : No/denies pain     Diabetes: No     Diabetic?No  Interpreter Needed?: No      Activities of Daily Living In your present state of health, do you have any difficulty performing the following activities: 10/27/2019 07/21/2019  Hearing? N N  Vision? N N  Difficulty concentrating or making decisions? Y N  Walking or climbing stairs? N N  Dressing or bathing? N N  Doing errands, shopping? N -  Preparing Food and eating ? N -  Using the Toilet? N -  In the past six months, have you accidently leaked urine? N -  Do you have problems with loss of bowel control? N -  Managing  your Medications? N -  Managing your Finances? N -  Housekeeping or managing your Housekeeping? N -  Some recent data might be hidden    Patient Care Team: Antony Blackbird, MD as PCP - General (Family Medicine) Services, [facility] as Referring Physician Christen Butter, MD as Referring Physician (Gynecology) Melida Quitter, MD as Consulting Physician (Otolaryngology) Loretta Plume as Consulting Physician (Neurosurgery)  Indicate any recent Medical Services you may have received from other than [facility] providers in the past year (date may be approximate).     Assessment:   This is a routine wellness examination for Myrla.  Hearing/Vision screen No exam data present  Dietary issues and exercise activities discussed:    Goals   None    Depression Screen PHQ 2/9 Scores 10/27/2019 01/20/2019 12/23/2018 12/01/2018 08/10/2018 05/31/2018 05/03/2018  PHQ - 2 Score - - - 2 0 0 0  PHQ- 9 Score - - - 9 - - -  Exception Documentation Patient refusal Patient refusal Patient refusal - - - -    Fall Risk Fall Risk  10/27/2019 08/10/2018 05/31/2018 05/03/2018 04/14/2018  Falls in the past year? 0 0 1 1 1   Number falls in past yr: - 0 0 0 0  Injury with Fall? - 0 1 0 -  Comment - - - - -  Risk for fall due to : No Fall Risks - - - History of fall(s);Medication side effect  Follow up - - - - Falls evaluation completed    Any stairs in or around the home? No  If so, are there any without handrails? n/a Home free of loose throw rugs in walkways, pet beds, electrical cords, etc? Yes  Adequate lighting in your home to reduce risk of falls? Yes   ASSISTIVE DEVICES UTILIZED TO PREVENT FALLS:  Life alert? No  Use of a cane, walker or w/c? No  Grab bars in the bathroom? No  Shower chair or bench in shower? No  Elevated toilet seat or a handicapped toilet? No   TIMED UP AND GO:  Was the test performed? No .  Length of time to ambulate 10 feet: 3 sec.   Gait steady and fast without use of  assistive device  Cognitive Function:        Immunizations There is no immunization history for the selected administration types on file for this patient.  TDAP status: Due, Education has been provided regarding the importance of this vaccine. Advised may receive this vaccine at local pharmacy or Health Dept. Aware to provide a copy of the vaccination record if obtained from local pharmacy or Health Dept. Verbalized acceptance and understanding. Flu Vaccine status: Up to date     Screening Tests Health Maintenance  Topic Date Due  . COVID-19 Vaccine (1) Never done  . PAP SMEAR-Modifier  01/25/2018  . TETANUS/TDAP  10/05/2028 (Originally 08/19/1990)  . INFLUENZA VACCINE  11/06/2019  . Hepatitis C Screening  Completed  . HIV Screening  Completed    Health Maintenance  Health Maintenance Due  Topic Date Due  . COVID-19 Vaccine (1) Never done  . PAP SMEAR-Modifier  01/25/2018      Lung Cancer Screening: (Low Dose CT Chest recommended if Age 6-80 years, 30 pack-year currently smoking OR have quit w/in 15years.) does not qualify.   Lung Cancer Screening Referral:  n/a  Additional Screening:  Hepatitis C Screening: does qualify; Completed yes  Vision Screening: Recommended annual ophthalmology exams for early detection of glaucoma and other disorders of the eye. Is the patient up to date with their annual eye exam?  n/a Who is the provider or what is the name of the office in which the patient attends annual eye exams?  If pt is not established with a provider, would they like to be referred to a provider to establish care? n/a.   Dental Screening: Recommended annual dental exams for proper oral hygiene  Community Resource Referral / Chronic Care Management: CRR required this visit?  No   CCM required this visit?  No      Plan:     I have personally reviewed and noted the following in the patient's chart:   . Medical and social history . Use of alcohol,  tobacco or illicit drugs  . Current medications and supplements . Functional ability and status . Nutritional status . Physical activity . Advanced directives . List of other physicians . Hospitalizations, surgeries, and ER visits in previous 12 months . Vitals . Screenings to include cognitive, depression, and falls . Referrals and appointments  In addition, I have reviewed and discussed with patient certain preventive protocols, quality metrics, and best practice recommendations. A written personalized care plan for preventive services as well as general preventive health recommendations were provided to patient.     Charlott Rakes, MD   10/27/2019

## 2019-10-28 ENCOUNTER — Telehealth: Payer: Self-pay | Admitting: Family Medicine

## 2019-10-28 NOTE — Telephone Encounter (Signed)
Please advise.  Copied from [place] [id number]. Topic: General - Inquiry >> Oct 28, 2019 10:07 AM Percell Belt A wrote: Reason for CRM: pt called in and stated that she was seen by Dr Margarita Rana and has some concerns about the visit .  She would like to talk with Practice admin Best umber -[phone]

## 2019-10-31 ENCOUNTER — Other Ambulatory Visit: Payer: Self-pay | Admitting: Family Medicine

## 2019-10-31 DIAGNOSIS — R87612 Low grade squamous intraepithelial lesion on cytologic smear of cervix (LGSIL): Secondary | ICD-10-CM

## 2019-10-31 LAB — CYTOLOGY - PAP
Adequacy: ABSENT
Comment: NEGATIVE
High risk HPV: NEGATIVE

## 2019-11-03 ENCOUNTER — Ambulatory Visit (HOSPITAL_COMMUNITY): Payer: Medicare Other | Admitting: Licensed Clinical Social Worker

## 2019-11-03 ENCOUNTER — Encounter: Payer: Self-pay | Admitting: Family Medicine

## 2019-11-03 ENCOUNTER — Telehealth: Payer: Self-pay | Admitting: Family Medicine

## 2019-11-03 NOTE — Telephone Encounter (Signed)
Returned call to patient regarding her concern about her referral and regarding her experience with the provider that saw her Dr. Margarita Rana. Patient states she already has a gynecologist with [facility]. I routed a message to Alinda Sierras the referral coordinator so she can send the referral to [facility]. Patient also stated that she was unhappy with her visit with Dr. Margarita Rana. Patient states that she read the notes of her visit and states that the note said that she did not have a fall within the last three months and patient states she did have a fall and she had mentioned it to the nurse. Patient also said that the notes said that she does not have depression but she states she sees a physiatrist. On the notes it also said that she does not have any neurological problems but the patient states she does. Patient states there is multiple concerns with her visit.

## 2019-11-03 NOTE — Telephone Encounter (Signed)
Referral  To Dr Otho Perl [facility] and lvm to patient with details    Ph#  [phone]  Fax # [phone]   [address] [place] , [place]  [address]

## 2019-11-03 NOTE — Telephone Encounter (Signed)
In the referral wasn't specify where to sent it . I just sent  Referral  To Dr Otho Perl [facility] and lvm to patient with details    Ph#  [phone]  Fax # [phone]   [address] [place] , [place]  [address]

## 2019-11-03 NOTE — Telephone Encounter (Signed)
Patient called, very upset, stating that her gyno referral was sent to the wrong provider. Patient states that this was previously discussed and she did not want to see a [facility] gynecologist. Patient states that she has called numerous times about this issue previously and sent mychart message today (7/29) regarding the issue and has not been contacted in what she considers a reasonable amount of time. Patient requesting gyno referral be resent, today, to [facility] with Dr. Otho Perl. Patient stated that if this was not taken care of today, she would come into office. Patient also mentioned dissatisfaction with practice administrator, Dr. Margarita Rana and the [facility] system in general stating that "[facility] does not care and that complaints never amount to anything within the system". Please contact patient as soon as possible to resolve.

## 2019-11-06 DIAGNOSIS — K122 Cellulitis and abscess of mouth: Secondary | ICD-10-CM | POA: Diagnosis not present

## 2019-11-06 DIAGNOSIS — R930 Abnormal findings on diagnostic imaging of skull and head, not elsewhere classified: Secondary | ICD-10-CM | POA: Diagnosis not present

## 2019-11-06 DIAGNOSIS — J320 Chronic maxillary sinusitis: Secondary | ICD-10-CM | POA: Diagnosis not present

## 2019-11-06 DIAGNOSIS — R200 Anesthesia of skin: Secondary | ICD-10-CM | POA: Diagnosis not present

## 2019-11-06 DIAGNOSIS — G500 Trigeminal neuralgia: Secondary | ICD-10-CM | POA: Diagnosis not present

## 2019-11-08 NOTE — Telephone Encounter (Signed)
DJ can you please reach out to the patient. Of note she refused to complete the Depression screen at her visit on 10/27/19 and did not complain of Neurological symptoms. She had a Medicare wellness exam. Thanks.

## 2019-11-09 ENCOUNTER — Other Ambulatory Visit: Payer: Self-pay | Admitting: Otolaryngology

## 2019-11-09 ENCOUNTER — Other Ambulatory Visit (HOSPITAL_COMMUNITY): Payer: Self-pay | Admitting: Otolaryngology

## 2019-11-09 DIAGNOSIS — J320 Chronic maxillary sinusitis: Secondary | ICD-10-CM

## 2019-11-09 DIAGNOSIS — K122 Cellulitis and abscess of mouth: Secondary | ICD-10-CM

## 2019-11-10 ENCOUNTER — Other Ambulatory Visit: Payer: Self-pay

## 2019-11-10 ENCOUNTER — Ambulatory Visit (INDEPENDENT_AMBULATORY_CARE_PROVIDER_SITE_OTHER): Payer: Medicare Other | Admitting: Licensed Clinical Social Worker

## 2019-11-10 DIAGNOSIS — F3160 Bipolar disorder, current episode mixed, unspecified: Secondary | ICD-10-CM | POA: Diagnosis not present

## 2019-11-12 DIAGNOSIS — F3162 Bipolar disorder, current episode mixed, moderate: Secondary | ICD-10-CM | POA: Diagnosis not present

## 2019-11-12 DIAGNOSIS — F3161 Bipolar disorder, current episode mixed, mild: Secondary | ICD-10-CM | POA: Diagnosis not present
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
## 2019-11-13 DIAGNOSIS — F3161 Bipolar disorder, current episode mixed, mild: Secondary | ICD-10-CM | POA: Diagnosis not present

## 2019-11-13 DIAGNOSIS — F3162 Bipolar disorder, current episode mixed, moderate: Secondary | ICD-10-CM | POA: Diagnosis not present

## 2019-11-14 ENCOUNTER — Ambulatory Visit (HOSPITAL_COMMUNITY): Admission: RE | Admit: 2019-11-14 | Payer: Medicare Other | Source: Ambulatory Visit

## 2019-11-14 DIAGNOSIS — F3162 Bipolar disorder, current episode mixed, moderate: Secondary | ICD-10-CM | POA: Diagnosis not present

## 2019-11-14 DIAGNOSIS — F3161 Bipolar disorder, current episode mixed, mild: Secondary | ICD-10-CM | POA: Diagnosis not present

## 2019-11-15 DIAGNOSIS — F3162 Bipolar disorder, current episode mixed, moderate: Secondary | ICD-10-CM | POA: Diagnosis not present

## 2019-11-15 DIAGNOSIS — F3161 Bipolar disorder, current episode mixed, mild: Secondary | ICD-10-CM | POA: Diagnosis not present

## 2019-11-16 DIAGNOSIS — F3162 Bipolar disorder, current episode mixed, moderate: Secondary | ICD-10-CM | POA: Diagnosis not present

## 2019-11-16 DIAGNOSIS — F3161 Bipolar disorder, current episode mixed, mild: Secondary | ICD-10-CM | POA: Diagnosis not present

## 2019-11-17 ENCOUNTER — Inpatient Hospital Stay: Payer: Medicare Other

## 2019-11-17 ENCOUNTER — Ambulatory Visit (HOSPITAL_COMMUNITY): Payer: Medicare Other | Admitting: Licensed Clinical Social Worker

## 2019-11-17 DIAGNOSIS — F3161 Bipolar disorder, current episode mixed, mild: Secondary | ICD-10-CM | POA: Diagnosis not present

## 2019-11-17 DIAGNOSIS — F3162 Bipolar disorder, current episode mixed, moderate: Secondary | ICD-10-CM | POA: Diagnosis not present

## 2019-11-18 DIAGNOSIS — F3161 Bipolar disorder, current episode mixed, mild: Secondary | ICD-10-CM | POA: Diagnosis not present

## 2019-11-18 DIAGNOSIS — F3162 Bipolar disorder, current episode mixed, moderate: Secondary | ICD-10-CM | POA: Diagnosis not present

## 2019-11-19 ENCOUNTER — Encounter: Payer: Self-pay | Admitting: Hematology and Oncology

## 2019-11-21 ENCOUNTER — Other Ambulatory Visit: Payer: Self-pay

## 2019-11-21 ENCOUNTER — Ambulatory Visit (INDEPENDENT_AMBULATORY_CARE_PROVIDER_SITE_OTHER): Payer: Medicare Other | Admitting: Licensed Clinical Social Worker

## 2019-11-21 ENCOUNTER — Telehealth: Payer: Self-pay | Admitting: Adult Health

## 2019-11-21 DIAGNOSIS — F3160 Bipolar disorder, current episode mixed, unspecified: Secondary | ICD-10-CM

## 2019-11-21 NOTE — Telephone Encounter (Signed)
Scheduled per 8/16 sch message. Pt is aware of appt time and date.

## 2019-11-22 ENCOUNTER — Ambulatory Visit (HOSPITAL_COMMUNITY)
Admission: RE | Admit: 2019-11-22 | Discharge: 2019-11-22 | Disposition: A | Discharge status: Home / Self Care | Payer: Medicare Other | Source: Ambulatory Visit | Attending: Otolaryngology | Admitting: Otolaryngology

## 2019-11-22 DIAGNOSIS — J320 Chronic maxillary sinusitis: Secondary | ICD-10-CM | POA: Diagnosis not present

## 2019-11-22 DIAGNOSIS — Z0389 Encounter for observation for other suspected diseases and conditions ruled out: Secondary | ICD-10-CM | POA: Diagnosis not present

## 2019-11-22 DIAGNOSIS — K122 Cellulitis and abscess of mouth: Secondary | ICD-10-CM | POA: Diagnosis not present

## 2019-11-22 MED ORDER — IOHEXOL 300 MG/ML  SOLN
75.0000 mL | Freq: Once | INTRAMUSCULAR | Status: AC | PRN
  Administered 2019-11-22: 75 mL via INTRAVENOUS
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
## 2019-11-25 DIAGNOSIS — F3112 Bipolar disorder, current episode manic without psychotic features, moderate: Secondary | ICD-10-CM | POA: Diagnosis not present

## 2019-11-30 ENCOUNTER — Telehealth (HOSPITAL_COMMUNITY): Payer: Self-pay | Admitting: Licensed Clinical Social Worker

## 2019-11-30 NOTE — Telephone Encounter (Signed)
LCSW phoned pt and left detailed message for her regarding anger management classes she can attend for no cost through [facility].

## 2019-12-04 NOTE — Progress Notes (Signed)
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 Lucia Gaskins: Intermediate grade DCIS 0.2 cm, 0.1 cm from anterior margin, LCIS, ER 95%, PR 30% Tis NX stage 0   07/21/2019 Cancer Staging   Staging form: Breast, AJCC 8th Edition - Pathologic stage from 07/21/2019: Stage 0 (pTis (DCIS), pN0, cM0, ER+, PR+) - Signed by Gardenia Phlegm, NP on 08/03/2019     CHIEF COMPLIANT: Follow-up of right breast DCIS  INTERVAL HISTORY: Catherine Olsen is a 49 y.o. with above-mentioned history of right breast DCIS who underwent a right lumpectomy, and has declined radiation and antiestrogen therapy. Mammogram on 10/26/19 showed no evidence of malignancy bilaterally. She presents to the clinic today for follow-up.    ALLERGIES:  is allergic to other, prednisone, sulfamethoxazole, oxycodone, abilify [aripiprazole], celebrex [celecoxib], penicillins, tylenol [acetaminophen], percocet [oxycodone-acetaminophen], sulfa antibiotics, and sulfites.  MEDICATIONS:  Current Outpatient Medications  Medication Sig Dispense Refill  . albuterol (VENTOLIN HFA) 108 (90 Base) MCG/ACT inhaler Inhale 2 puffs into the lungs every 6 (six) hours as needed for wheezing or shortness of breath. 8 g 12  . Ascorbic Acid (VITAMIN C) 1000 MG tablet Take 1,000 mg by mouth daily. (Patient not taking: Reported on 10/27/2019)    . calcium-vitamin D (OSCAL  WITH D) 500-200 MG-UNIT tablet Take 1 tablet by mouth.    . cetirizine (ZYRTEC) 10 MG tablet Take 10 mg by mouth daily.    . clonazePAM (KLONOPIN) 1 MG tablet Take 1 mg by mouth 4 (four) times daily as needed. (Patient not taking: Reported on 10/27/2019)    . diphenhydrAMINE (BENADRYL) 25 MG tablet Take 1 tablet (25 mg total) by mouth 3 (three) times daily. Take one tablet three times daily for two days (Patient not taking: Reported on 10/27/2019) 10 tablet 0  . doxycycline (VIBRAMYCIN) 100 MG capsule Take 1 capsule (100 mg total) by mouth 2 (two) times daily. (Patient not taking: Reported on 10/27/2019) 20 capsule 0  . EPINEPHrine 0.3 mg/0.3 mL IJ SOAJ injection Inject 0.3 mLs into the skin once.  2  . ibuprofen (ADVIL,MOTRIN) 800 MG tablet TAKE 1 TABLET(800 MG) BY MOUTH EVERY 8 HOURS AS NEEDED (Patient taking differently: 600-800 mg as needed. ) 30 tablet 0  . magnesium oxide (MAG-OX) 400 MG tablet Take 400 mg by mouth daily.    . nicotine (NICODERM CQ - DOSED IN MG/24 HOURS) 21 mg/24hr patch 21 mg daily. (Patient not taking: Reported on 10/19/2019)    . predniSONE (DELTASONE) 10 MG tablet Take 2 tablets (20 mg total) by mouth daily. (Patient not taking: Reported on 10/27/2019) 10 tablet 0  . temazepam (RESTORIL) 15 MG capsule Take 15-45 mg by mouth at bedtime.     No current facility-administered medications for this visit.    PHYSICAL EXAMINATION: ECOG PERFORMANCE STATUS: 1 - Symptomatic but completely ambulatory  There were no vitals filed for this visit. There were no vitals filed for this visit.     LABORATORY  DATA:  I have reviewed the data as listed CMP Latest Ref Rng & Units 10/14/2019 07/07/2019 12/24/2018  Glucose 70 - 99 mg/dL 89 96 98  BUN 6 - 20 mg/dL 13 14 20   Creatinine 0.44 - 1.00 mg/dL 0.63 0.63 0.67  Sodium 135 - 145 mmol/L 142 138 143  Potassium 3.5 - 5.1 mmol/L 4.4 3.8 4.1  Chloride 98 - 111 mmol/L 108 106 106  CO2 22 - 32 mmol/L 25 21(L) 22  Calcium 8.9 - 10.3 mg/dL 9.2  9.0 9.5  Total Protein 6.5 - 8.1 g/dL 6.5 6.2(L) 6.4  Total Bilirubin 0.3 - 1.2 mg/dL 0.6 0.6 0.3  Alkaline Phos 38 - 126 U/L 53 55 59  AST 15 - 41 U/L 16 8(L) 11  ALT 0 - 44 U/L 18 8 8     Lab Results  Component Value Date   WBC 6.1 10/14/2019   HGB 13.1 10/14/2019   HCT 39.6 10/14/2019   MCV 94.3 10/14/2019   PLT 244 10/14/2019   NEUTROABS 3.0 10/14/2019    ASSESSMENT & PLAN:  Ductal carcinoma in situ (DCIS) of right breast Right lumpectomy: Dr. 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  Adjuvant radiation therapy: Patient discussed with Dr. Sondra Come and agreed to radiation. 2.  Opted against anti estrogen therapy.   ____________________________________________________________________________________________ Maxillofacial CT scan 11/22/2019: Normal   Estradiol levels were drawn today. I answered several questions regarding her prognosis if no antiestrogen therapy is found.  Patient wants to go back to intermittent fasting and thereby reduce her estradiol levels. Mammogram 10/26/2019: Benign breast density category D Return to clinic in 3 months with labs and follow-up    No orders of the defined types were placed in this encounter.  The patient has a good understanding of the overall plan. she agrees with it. she will call with any problems that may develop before the next visit here.  Total time spent: 20 mins including face to face time and time spent for planning, charting and coordination of care  Gardenia Phlegm* 12/05/2019  I, Cloyde Reams Dorshimer, am acting as scribe for Dr. Nicholas Lose.  I have reviewed the above documentation for accuracy and completeness, and I agree with the above.

## 2019-12-05 ENCOUNTER — Inpatient Hospital Stay: Payer: Medicare Other | Attending: Hematology and Oncology | Admitting: Hematology and Oncology

## 2019-12-05 ENCOUNTER — Inpatient Hospital Stay: Payer: Medicare Other

## 2019-12-05 ENCOUNTER — Other Ambulatory Visit: Payer: Self-pay

## 2019-12-05 DIAGNOSIS — Z170 Estrogen receptor positive status [ER+]: Secondary | ICD-10-CM | POA: Insufficient documentation

## 2019-12-05 DIAGNOSIS — D0511 Intraductal carcinoma in situ of right breast: Secondary | ICD-10-CM

## 2019-12-05 MED ORDER — GABAPENTIN 300 MG PO CAPS
300.0000 mg | ORAL_CAPSULE | Freq: Three times a day (TID) | ORAL | Status: DC
Start: 2019-12-05 — End: 2019-12-09

## 2019-12-05 NOTE — Assessment & Plan Note (Signed)
Right lumpectomy: Dr. 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  Adjuvant radiation therapy: Patient discussed with Dr. Sondra Come and agreed to radiation. 2.  Opted against anti estrogen therapy.   ____________________________________________________________________________________________ Maxillofacial CT scan 11/22/2019: Normal   Breast cancer surveillance: Mammogram 10/26/2019: Benign breast density category D Return to clinic in 1 year for follow-up

## 2019-12-06 ENCOUNTER — Telehealth: Payer: Self-pay | Admitting: Hematology and Oncology

## 2019-12-06 NOTE — Telephone Encounter (Signed)
No 8/30 los. No changes made to pt's schedule.  °

## 2019-12-08 LAB — ESTRADIOL, ULTRA SENS: Estradiol, Sensitive: <2.5 pg/mL

## 2019-12-09 ENCOUNTER — Ambulatory Visit (HOSPITAL_COMMUNITY)
Admission: EM | Admit: 2019-12-09 | Discharge: 2019-12-09 | Disposition: A | Discharge status: Home / Self Care | Payer: Medicare Other | Attending: Family | Admitting: Family

## 2019-12-09 ENCOUNTER — Encounter: Payer: Medicare Other | Admitting: Family Medicine

## 2019-12-09 ENCOUNTER — Other Ambulatory Visit: Payer: Self-pay

## 2019-12-09 DIAGNOSIS — J449 Chronic obstructive pulmonary disease, unspecified: Secondary | ICD-10-CM | POA: Insufficient documentation

## 2019-12-09 DIAGNOSIS — F4310 Post-traumatic stress disorder, unspecified: Secondary | ICD-10-CM | POA: Insufficient documentation

## 2019-12-09 DIAGNOSIS — Z87891 Personal history of nicotine dependence: Secondary | ICD-10-CM | POA: Insufficient documentation

## 2019-12-09 DIAGNOSIS — K219 Gastro-esophageal reflux disease without esophagitis: Secondary | ICD-10-CM | POA: Diagnosis not present

## 2019-12-09 DIAGNOSIS — Z79899 Other long term (current) drug therapy: Secondary | ICD-10-CM | POA: Diagnosis not present

## 2019-12-09 DIAGNOSIS — G2581 Restless legs syndrome: Secondary | ICD-10-CM | POA: Diagnosis not present

## 2019-12-09 DIAGNOSIS — Z882 Allergy status to sulfonamides status: Secondary | ICD-10-CM | POA: Insufficient documentation

## 2019-12-09 DIAGNOSIS — F1490 Cocaine use, unspecified, uncomplicated: Secondary | ICD-10-CM | POA: Insufficient documentation

## 2019-12-09 DIAGNOSIS — Z20822 Contact with and (suspected) exposure to covid-19: Secondary | ICD-10-CM | POA: Diagnosis not present

## 2019-12-09 DIAGNOSIS — G4700 Insomnia, unspecified: Secondary | ICD-10-CM | POA: Insufficient documentation

## 2019-12-09 DIAGNOSIS — F419 Anxiety disorder, unspecified: Secondary | ICD-10-CM | POA: Insufficient documentation

## 2019-12-09 DIAGNOSIS — F314 Bipolar disorder, current episode depressed, severe, without psychotic features: Secondary | ICD-10-CM | POA: Insufficient documentation

## 2019-12-09 DIAGNOSIS — Z880 Allergy status to penicillin: Secondary | ICD-10-CM | POA: Diagnosis not present

## 2019-12-09 DIAGNOSIS — Z881 Allergy status to other antibiotic agents status: Secondary | ICD-10-CM | POA: Diagnosis not present

## 2019-12-09 DIAGNOSIS — Z885 Allergy status to narcotic agent status: Secondary | ICD-10-CM | POA: Insufficient documentation

## 2019-12-09 DIAGNOSIS — D497 Neoplasm of unspecified behavior of endocrine glands and other parts of nervous system: Secondary | ICD-10-CM | POA: Insufficient documentation

## 2019-12-09 DIAGNOSIS — Z888 Allergy status to other drugs, medicaments and biological substances status: Secondary | ICD-10-CM | POA: Insufficient documentation

## 2019-12-09 DIAGNOSIS — F332 Major depressive disorder, recurrent severe without psychotic features: Secondary | ICD-10-CM | POA: Diagnosis not present

## 2019-12-09 DIAGNOSIS — R45851 Suicidal ideations: Secondary | ICD-10-CM | POA: Insufficient documentation

## 2019-12-09 DIAGNOSIS — Z853 Personal history of malignant neoplasm of breast: Secondary | ICD-10-CM | POA: Insufficient documentation

## 2019-12-09 DIAGNOSIS — M1990 Unspecified osteoarthritis, unspecified site: Secondary | ICD-10-CM | POA: Diagnosis not present

## 2019-12-09 DIAGNOSIS — M797 Fibromyalgia: Secondary | ICD-10-CM | POA: Diagnosis not present

## 2019-12-09 LAB — CBC WITH DIFFERENTIAL/PLATELET
Abs Immature Granulocytes: 0.01 10*3/uL (ref 0.00–0.07)
Basophils Absolute: 0 10*3/uL (ref 0.0–0.1)
Basophils Relative: 0 %
Eosinophils Absolute: 0.3 10*3/uL (ref 0.0–0.5)
Eosinophils Relative: 5 %
HCT: 46 % (ref 36.0–46.0)
Hemoglobin: 15.4 g/dL — ABNORMAL HIGH (ref 12.0–15.0)
Immature Granulocytes: 0 %
Lymphocytes Relative: 30 %
Lymphs Abs: 1.7 10*3/uL (ref 0.7–4.0)
MCH: 31.3 pg (ref 26.0–34.0)
MCHC: 33.5 g/dL (ref 30.0–36.0)
MCV: 93.5 fL (ref 80.0–100.0)
Monocytes Absolute: 0.3 10*3/uL (ref 0.1–1.0)
Monocytes Relative: 5 %
Neutro Abs: 3.3 10*3/uL (ref 1.7–7.7)
Neutrophils Relative %: 60 %
Platelets: 281 10*3/uL (ref 150–400)
RBC: 4.92 MIL/uL (ref 3.87–5.11)
RDW: 13.5 % (ref 11.5–15.5)
WBC: 5.6 10*3/uL (ref 4.0–10.5)
nRBC: 0 % (ref 0.0–0.2)

## 2019-12-09 LAB — POCT URINE DRUG SCREEN - MANUAL ENTRY (I-SCREEN)
POC Amphetamine UR: NOT DETECTED
POC Buprenorphine (BUP): NOT DETECTED
POC Cocaine UR: POSITIVE — AB
POC Marijuana UR: NOT DETECTED
POC Methadone UR: NOT DETECTED
POC Methamphetamine UR: NOT DETECTED
POC Morphine: NOT DETECTED
POC Oxazepam (BZO): NOT DETECTED
POC Oxycodone UR: NOT DETECTED
POC Secobarbital (BAR): NOT DETECTED

## 2019-12-09 LAB — COMPREHENSIVE METABOLIC PANEL
ALT: 20 U/L (ref 0–44)
AST: 20 U/L (ref 15–41)
Albumin: 4.8 g/dL (ref 3.5–5.0)
Alkaline Phosphatase: 65 U/L (ref 38–126)
Anion gap: 12 (ref 5–15)
BUN: 14 mg/dL (ref 6–20)
CO2: 27 mmol/L (ref 22–32)
Calcium: 10.1 mg/dL (ref 8.9–10.3)
Chloride: 99 mmol/L (ref 98–111)
Creatinine, Ser: 0.8 mg/dL (ref 0.44–1.00)
GFR calc Af Amer: >60 mL/min (ref >60)
GFR calc non Af Amer: >60 mL/min (ref >60)
Glucose, Bld: 102 mg/dL — ABNORMAL HIGH (ref 70–99)
Potassium: 4 mmol/L (ref 3.5–5.1)
Sodium: 138 mmol/L (ref 135–145)
Total Bilirubin: 0.8 mg/dL (ref 0.3–1.2)
Total Protein: 7.8 g/dL (ref 6.5–8.1)

## 2019-12-09 LAB — LIPID PANEL
Cholesterol: 210 mg/dL — ABNORMAL HIGH (ref 0–200)
HDL: 69 mg/dL (ref >40)
LDL Cholesterol: 115 mg/dL — ABNORMAL HIGH (ref 0–99)
Total CHOL/HDL Ratio: 3 RATIO
Triglycerides: 131 mg/dL (ref <150)
VLDL: 26 mg/dL (ref 0–40)

## 2019-12-09 LAB — MAGNESIUM: Magnesium: 2 mg/dL (ref 1.7–2.4)

## 2019-12-09 LAB — HEMOGLOBIN A1C
Hgb A1c MFr Bld: 5.5 % (ref 4.8–5.6)
Mean Plasma Glucose: 111.15 mg/dL

## 2019-12-09 LAB — ETHANOL: Alcohol, Ethyl (B): <10 mg/dL (ref <10)

## 2019-12-09 LAB — POC SARS CORONAVIRUS 2 AG: SARS Coronavirus 2 Ag: NEGATIVE

## 2019-12-09 LAB — POC SARS CORONAVIRUS 2 AG -  ED: SARS Coronavirus 2 Ag: NEGATIVE

## 2019-12-09 LAB — POCT PREGNANCY, URINE: Preg Test, Ur: NEGATIVE

## 2019-12-09 LAB — TSH: TSH: 0.957 u[IU]/mL (ref 0.350–4.500)

## 2019-12-09 LAB — SARS CORONAVIRUS 2 BY RT PCR (HOSPITAL ORDER, PERFORMED IN ~~LOC~~ HOSPITAL LAB): SARS Coronavirus 2: NEGATIVE

## 2019-12-09 MED ORDER — MAGNESIUM HYDROXIDE 400 MG/5ML PO SUSP: 30.0000 mL | Freq: Every day | ORAL | Status: DC | PRN

## 2019-12-09 MED ORDER — CALCIUM CARBONATE-VITAMIN D 500-200 MG-UNIT PO TABS
1.0000 | ORAL_TABLET | Freq: Three times a day (TID) | ORAL | Status: DC
  Administered 2019-12-09: 1 via ORAL
  Filled 2019-12-09: qty 1

## 2019-12-09 MED ORDER — HYDROXYZINE HCL 25 MG PO TABS: 25.0000 mg | ORAL_TABLET | Freq: Three times a day (TID) | ORAL | Status: DC | PRN

## 2019-12-09 MED ORDER — MAGNESIUM OXIDE 400 (241.3 MG) MG PO TABS
400.0000 mg | ORAL_TABLET | Freq: Every day | ORAL | Status: DC
  Filled 2019-12-09 (×2): qty 1

## 2019-12-09 MED ORDER — ALUM & MAG HYDROXIDE-SIMETH 200-200-20 MG/5ML PO SUSP: 30.0000 mL | ORAL | Status: DC | PRN

## 2019-12-09 MED ORDER — ASCORBIC ACID 500 MG PO TABS
500.0000 mg | ORAL_TABLET | Freq: Three times a day (TID) | ORAL | Status: DC
  Administered 2019-12-09: 500 mg via ORAL
  Filled 2019-12-09: qty 1

## 2019-12-09 MED ORDER — QUETIAPINE FUMARATE 25 MG PO TABS: 25.0000 mg | ORAL_TABLET | Freq: Every day | ORAL | Status: DC

## 2019-12-09 MED ORDER — GABAPENTIN 600 MG PO TABS: 600.0000 mg | ORAL_TABLET | Freq: Every day | ORAL | Status: DC

## 2019-12-09 MED ORDER — GABAPENTIN 100 MG PO CAPS: 100.0000 mg | ORAL_CAPSULE | ORAL | Status: DC
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
## 2019-12-09 NOTE — ED Notes (Signed)
Pt discharged with mother. No acute distress noted. Safety maintained

## 2019-12-09 NOTE — ED Notes (Signed)
Pt signed AMA forms. Discussed consequences of leaving AMA in r/t pt's mental health. Pt denies SI and states, "I was feeling suicidal but I wouldn't do that to my mother. Please continue to pray for me though". Support and encouragement given. Awaiting patients' mother arrival to pick pt up. Safety maintained.

## 2019-12-09 NOTE — ED Provider Notes (Signed)
Behavioral Health Admission H&P [facility] & OBS)  Date: 12/09/19 Patient Name: Catherine Olsen MRN: [id number] Chief Complaint:  Chief Complaint  Patient presents with  . Depression  . Suicidal      Diagnoses:  Final diagnoses:  Severe episode of recurrent major depressive disorder, without psychotic features ([place])    HPI: Patient presents voluntarily to [facility] behavioral health center for walk-in assessment. Patient assessed by nurse practitioner.  Patient alert and oriented, answers appropriately.  Patient pleasant cooperative during assessment.  Patient states "if I leave here I will go home and kill myself by overdosing on my Neurontin." Patient reports recently admitted to [facility] from August 6 until November 23, 2019.  Patient reports she left too soon, after only 12 days, related to dietary restrictions and issues with staff.  Patient reports most pressing concern is medication management.  Patient endorses suicidal ideations with a plan and intent.  Patient reports she has access to gabapentin as this is her home medication.  Patient endorses "several suicide attempts throughout the years."  Patient reports she believes she has been hospitalized in inpatient psychiatric facilities approximately 13 times.  Patient denies homicidal ideations.  Patient denies auditory and visual hallucinations.  There is no evidence of delusional thought content and patient does not appear to be responding to internal stimuli.  Patient denies symptoms of paranoia.  Patient reports she lives with her mother in [place].  Patient denies access to weapons.  Patient reports she currently receives disability benefits.  Patient denies alcohol use.  Patient endorses cocaine use.  Last cocaine use on yesterday approximately $20 worth of cocaine.  Patient reports she only uses cocaine when she feels suicidal.  Patient denies that she is addicted to cocaine and is not seeking treatment for substance  use disorder.  Patient reports that she believes cocaine "elevates dopamine levels so I used some on yesterday and Tuesday."  Patient reports she is diagnosed with bipolar disorder, PTSD and anxiety.  Patient seen outpatient by [name] for medication management.  Patient reports she did not reach out to outpatient psychiatrist because she believes the outpatient psychiatrist would recommend inpatient treatment at [facility] and patient would prefer not to return to [facility].  Patient reports she is seen by outpatient counseling at [facility].  Patient reports she did not reach out to outpatient therapist Geni Bers because "we did not hit it off and she does not have enough availability so I am looking for a new therapist."  Patient reports she will not take benzodiazepine medications as she feels they have "ruined my life."  Patient reports difficulty sleeping with some improvement when using melatonin.  Patient reports she has most recently used Neurontin to assist with sleep but continues to report difficulty falling asleep.  Patient reports trazodone "caused my lifelong restless leg syndrome."  Patient reports she would prefer not to take antipsychotic medications related to their metabolic side effects given her medical history. Discussed Seroquel to assist with insomnia, patient reports Seroquel has been effective to address insomnia in the past.    Patient offered support and encouragement.  Discussed substance use, particularly when used concurrently with psychotropic medications.  Discussed inpatient psychiatric treatment recommendation and reminded patient that inpatient psychiatric placement can be challenging therefore any bed offer must be carefully considered.  Discussed criteria for involuntary commitment, patient remains voluntary at this time.  PHQ 2-9:    Office Visit from 12/01/2018 in Primary Care at  [facility] Office Visit from 01/20/2018 in  Primary Care at [facility] from 09/19/2015 in Primary Care at [facility] that you would be better off dead, or of hurting yourself in some way Not at all Not at all Not at all  PHQ-9 Total Score 9 -- 8        Admission (Discharged) from 09/08/2018 in [facility] ED from 09/07/2018 in [facility] CATEGORY High Risk High Risk       Total Time spent with patient: 30 minutes  Musculoskeletal  Strength & Muscle Tone: within normal limits Gait & Station: normal Patient leans: N/A  Psychiatric Specialty Exam  Presentation General Appearance: Appropriate for Environment;Casual;Well Groomed  Eye Contact:Good  Speech:Normal Rate  Speech Volume:Normal  Handedness:Right   Mood and Affect  Mood:Anxious  Affect:Congruent   Thought Process  Thought Processes:Coherent;Goal Directed  Descriptions of Associations:Intact  Orientation:Full (Time, Place and Person)  Thought Content:Logical  Hallucinations:Hallucinations: None  Ideas of Reference:None  Suicidal Thoughts:Suicidal Thoughts: Yes, Active SI Active Intent and/or Plan: With Intent;With Plan;With Means to Carry Out  Homicidal Thoughts:Homicidal Thoughts: No   Sensorium  Memory:Immediate Good;Recent Good;Remote Good  Judgment:Good  Insight:Fair   Executive Functions  Concentration:Good  Attention Span:Good  [place] of Knowledge:Good  Language:Good   Psychomotor Activity  Psychomotor Activity:Psychomotor Activity: Normal   Assets  Assets:Communication Skills;Desire for Improvement;Financial Resources/Insurance;Housing;Intimacy;Leisure Time;Physical Health;Resilience;Social Support;Talents/Skills;Transportation   Sleep  Sleep:Sleep: Fair   Physical Exam Vitals and nursing note reviewed.  Constitutional:      Appearance: She is well-developed.  HENT:     Head: Normocephalic.  Cardiovascular:      Rate and Rhythm: Normal rate.  Pulmonary:     Effort: Pulmonary effort is normal.  Neurological:     Mental Status: She is alert and oriented to person, place, and time.  Psychiatric:        Attention and Perception: Attention and perception normal.        Mood and Affect: Mood and affect normal.        Speech: Speech normal.        Behavior: Behavior normal. Behavior is cooperative.        Thought Content: Thought content includes suicidal ideation. Thought content includes suicidal plan.        Cognition and Memory: Cognition and memory normal.        Judgment: Judgment normal.    Review of Systems  Constitutional: Negative.   HENT: Negative.   Eyes: Negative.   Respiratory: Negative.   Cardiovascular: Negative.   Gastrointestinal: Negative.   Genitourinary: Negative.   Musculoskeletal: Negative.   Skin: Negative.   Neurological: Negative.   Endo/Heme/Allergies: Negative.   Psychiatric/Behavioral: Positive for substance abuse and suicidal ideas. The patient has insomnia.     Blood pressure 107/88, pulse 90, temperature 97.6 F (36.4 C), temperature source Oral, resp. rate 18, height 5\' 8"  (1.727 m), weight 138 lb (62.6 kg), last menstrual period 11/21/2014. Body mass index is 20.98 kg/m.  Past Psychiatric History: Bipolar disorder, depression, amphetamine abuse, cocaine abuse, insomnia  Is the patient at risk to self? Yes  Has the patient been a risk to self in the past 6 months? Yes .    Has the patient been a risk to self within the distant past? Yes   Is the patient a risk to others? No   Has the patient been a risk to others in the past  6 months? No   Has the patient been a risk to others within the distant past? No   Past Medical History:  Past Medical History:  Diagnosis Date  . Abnormal uterine bleeding (AUB) 06/25/2009   Qualifier: Diagnosis of  By: Cathren Laine MD, Ankit    . Allergy   . Anxiety   . Arthritis    hands, lower back, knees  . Asthma   . Bipolar 1  disorder ([place])   . Breast cancer (HCC)    stage 0  . COPD (chronic obstructive pulmonary disease) ([place])   . Depression   . Endometriosis   . Fibromyalgia   . GERD (gastroesophageal reflux disease)   . Headache(784.0)    otc med prn  . Migraine   . Pituitary tumor    microadenoma  . PONV (postoperative nausea and vomiting)   . PTSD (post-traumatic stress disorder)   . Scoliosis   . Termination of pregnancy    x 2 at age 60 and 49 yrs old  . Tobacco abuse 03/04/2015    Past Surgical History:  Procedure Laterality Date  . BREAST BIOPSY Right 05/19/2007  . BREAST BIOPSY Right 06/02/2007  . BREAST BIOPSY  06/29/2019  . BREAST LUMPECTOMY Right 07/21/2019  . BREAST LUMPECTOMY WITH RADIOACTIVE SEED LOCALIZATION Right 07/21/2019   Procedure: RIGHT BREAST LUMPECTOMY WITH RADIOACTIVE SEED LOCALIZATION;  Surgeon: Alphonsa Overall, MD;  Location: [facility];  Service: General;  Laterality: Right;  . BREAST SURGERY    . DILATION AND CURETTAGE OF UTERUS    . FACIAL COSMETIC SURGERY     right cheek  . LAPAROSCOPY Right 11/11/2013   Procedure: LAPAROSCOPY OPERATIVE with  Drainage of  RIGHT Ovarian ENDOMETRIOMA;  Surgeon: Elveria Royals, MD;  Location: [facility];  Service: Gynecology;  Laterality: Right;  . NASAL ENDOSCOPY     said it showed some acid reflux from ENT  . NOSE SURGERY     rhinoplasty at age 78 yrs  . WISDOM TOOTH EXTRACTION      Family History:  Family History  Problem Relation Age of Onset  . Diabetes Mother   . Hypertension Mother   . Stroke Mother 71       CVA  . Mental illness Mother        no diagnosis; personality disorder  . Hyperlipidemia Father   . Hypertension Father   . COPD Father   . Alpha-1 antitrypsin deficiency Father   . Asthma Father   . Alpha-1 antitrypsin deficiency Brother   . Asthma Brother   . Diabetes Maternal Grandmother   . Heart disease Maternal Grandmother   . Hyperlipidemia Maternal Grandmother   . Hypertension Maternal  Grandmother   . Mental illness Maternal Grandmother   . Heart disease Maternal Grandfather   . Hyperlipidemia Maternal Grandfather   . Hypertension Maternal Grandfather   . Asthma Maternal Grandfather   . Heart disease Paternal Grandfather   . Hyperlipidemia Paternal Grandfather   . Hypertension Paternal Grandfather   . Stroke Paternal Grandfather   . Alzheimer's disease Paternal Grandmother   . Allergic rhinitis Neg Hx   . Angioedema Neg Hx   . Eczema Neg Hx   . Urticaria Neg Hx   . Immunodeficiency Neg Hx   . Colon cancer Neg Hx   . Esophageal cancer Neg Hx     Social History:  Social History   Socioeconomic History  . Marital status: Divorced    Spouse name: Not on file  . Number  of children: 0  . Years of education: Not on file  . Highest education level: Bachelor's degree (e.g., BA, AB, BS)  Occupational History  . Occupation: disability    Comment: mental illness  Tobacco Use  . Smoking status: Former Smoker    Packs/day: 2.00    Years: 29.00    Pack years: 58.00    Types: Cigarettes    Quit date: 10/08/2019    Years since quitting: 0.1  . Smokeless tobacco: Never Used  Vaping Use  . Vaping Use: Former  Substance and Sexual Activity  . Alcohol use: Not Currently  . Drug use: Not Currently    Types: "Crack" cocaine    Comment: 07/19/2019  . Sexual activity: Not Currently    Comment: abortion at 7 and 49yrs. of age   Other Topics Concern  . Not on file  Social History Narrative   Marital status: divorced; not dating      Children: none      Lives: alone with mom      Employment: disability for mental illness in 1997.  Hospitalizations x 9 in past.      Tobacco: 1 ppd x since age 17. Decreasing in 2018.      Alcohol: socially; beers.      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      Exercise: yoga daily; exercise daily.      ADLs: independent with ADLs; no car; depends on others for transportation.      Patient is right-handed. She  is divorced and lives with her mother. She drinks 2-3 cups of 1/2 caffeine coffe a day. She walks occasionally for exercise.   Social Determinants of Health   Financial Resource Strain:   . Difficulty of Paying Living Expenses: Not on file  Food Insecurity:   . Worried About Charity fundraiser in the Last Year: Not on file  . Ran Out of Food in the Last Year: Not on file  Transportation Needs:   . Lack of Transportation (Medical): Not on file  . Lack of Transportation (Non-Medical): Not on file  Physical Activity:   . Days of Exercise per Week: Not on file  . Minutes of Exercise per Session: Not on file  Stress:   . Feeling of Stress : Not on file  Social Connections:   . Frequency of Communication with Friends and Family: Not on file  . Frequency of Social Gatherings with Friends and Family: Not on file  . Attends Religious Services: Not on file  . Active Member of Clubs or Organizations: Not on file  . Attends Archivist Meetings: Not on file  . Marital Status: Not on file  Intimate Partner Violence:   . Fear of Current or Ex-Partner: Not on file  . Emotionally Abused: Not on file  . Physically Abused: Not on file  . Sexually Abused: Not on file    SDOH:  SDOH Screenings   Alcohol Screen:   . Last Alcohol Screening Score (AUDIT): Not on file  Depression (PHQ2-9):   . PHQ-2 Score: Not on file  Financial Resource Strain:   . Difficulty of Paying Living Expenses: Not on file  Food Insecurity:   . Worried About Charity fundraiser in the Last Year: Not on file  . Ran Out of Food in the Last Year: Not on file  Housing:   . Last Housing Risk Score: Not on file  Physical Activity:   . Days  of Exercise per Week: Not on file  . Minutes of Exercise per Session: Not on file  Social Connections:   . Frequency of Communication with Friends and Family: Not on file  . Frequency of Social Gatherings with Friends and Family: Not on file  . Attends Religious Services:  Not on file  . Active Member of Clubs or Organizations: Not on file  . Attends Archivist Meetings: Not on file  . Marital Status: Not on file  Stress:   . Feeling of Stress : Not on file  Tobacco Use: Medium Risk  . Smoking Tobacco Use: Former Smoker  . Smokeless Tobacco Use: Never Used  Transportation Needs:   . Film/video editor (Medical): Not on file  . Lack of Transportation (Non-Medical): Not on file    Last Labs:   Allergies: Other, Prednisone, Sulfamethoxazole, Oxycodone, Abilify [aripiprazole], Celebrex [celecoxib], Penicillins, Tylenol [acetaminophen], Percocet [oxycodone-acetaminophen], Sulfa antibiotics, and Sulfites  PTA Medications: (Not in a hospital admission)   Medical Decision Making  Patient reviewed with Dr. Hampton Abbot. Discussed Seroquel with patient, including side effects. History of QTc prolongation. QTc level noted 480, if patient remains on this medication chronically recommend follow-up EKG to confirm QTc remains stable.   Start Seroquel 25 mg nightly/insomnia. Hydroxyzine 25 mg 3 times daily as needed/anxiety. Restart home medications.     Recommendations  Based on my evaluation the patient does not appear to have an emergency medical condition.   Inpatient psychiatric treatment recommended.  Patient will be placed in the continuous assessment area at [facility] while awaiting inpatient psychiatric placement.  Emmaline Kluver, FNP 12/09/19  1:54 PM

## 2019-12-09 NOTE — Progress Notes (Addendum)
Patient meets inpatient criteria per [name], NP. Referral information has been faxed to the following hospitals for review  -  [facility] Details       [facility] Details       [facility] Details       [facility] Details       [facility] Details       [facility] Details       [facility] Details       [facility] Details       [facility] Details       [facility] Details       [facility] Details       [facility] Details       [facility] Details       [facility] Details       [facility] Details       [facility] Details       [place] [facility] Detail          Disposition will continue to follow for inpatient placement needs.  [place] Disposition, CSW [phone] (cell

## 2019-12-09 NOTE — ED Notes (Signed)
Pt in room with NP. Safety maintained

## 2019-12-09 NOTE — Discharge Instructions (Signed)

## 2019-12-09 NOTE — ED Notes (Signed)
Pt admitted to continuous assessment due to SI with plan to OD on Gabapentin. Pt states, "What's the use living. You get sick and die anyway. So, I figure I'll just speed up the process. My cancer in my breast may be back and I don't want to go thru a lot of bullshit testing, so, why live"? Pt defiant, uncooperative at times. Pt refused to remove bra with plastic wiring stating, "I had breast cancer. I can't go without a bra". [name], NP made aware of pt's refusal to remove her bra as well as refusal of CBG test. Pt denies HI/AVH. Will monitor for safety.

## 2019-12-09 NOTE — ED Notes (Signed)
Belongings in locker 25

## 2019-12-09 NOTE — ED Provider Notes (Signed)
FBC/OBS ASAP Discharge Summary  Date and Time: 12/09/2019 6:49 PM  Name: Catherine Olsen  MRN:  [id number]   Discharge Diagnoses:  Final diagnoses:  Severe episode of recurrent major depressive disorder, without psychotic features [facility])    Subjective: Patient reports readiness to discharge home.  Patient reports frustration regarding food unit policy and medication administration.  Patient states "if I feel suicidal I will come back, I would never kill myself as and that this would hurt my father and I am Catholic I do not want to be damned."  Patient currently denies suicidal ideations.  Patient continues to deny homicidal ideations.  Patient continues to deny auditory visual hallucinations.  Patient does not appear to be responding to internal stimuli and there is no evidence of delusional thought content.  Patient denies symptoms of paranoia.  Patient reports she would like to follow-up with outpatient psychiatric provider on Monday.   Patient would like to sign AMA form.  Patient gives verbal consent to speak with her mother, Adonis Huguenin phone number [phone].  Spoke with patient's mother who denies concerns for patient safety.  Patient's mother reports "if she says not and that is the way it is she usually admits when she is feeling any other way."  Patient's mother reports plan to transport patient home this evening.   Stay Summary:  Per H&P: Patient presents voluntarily to [facility] behavioral health center for walk-in assessment. Patient assessed by nurse practitioner.  Patient alert and oriented, answers appropriately.  Patient pleasant cooperative during assessment.  Patient states "if I leave here I will go home and kill myself by overdosing on my Neurontin." Patient reports recently admitted to [facility] from August 6 until November 23, 2019.  Patient reports she left too soon, after only 12 days, related to dietary restrictions and issues with staff.  Patient reports  most pressing concern is medication management.  Total Time spent with patient: 30 minutes  Past Psychiatric History: Bipolar disorder, major depressive disorder, cocaine use disorder Past Medical History:  Past Medical History:  Diagnosis Date  . Abnormal uterine bleeding (AUB) 06/25/2009   Qualifier: Diagnosis of  By: Cathren Laine MD, Ankit    . Allergy   . Anxiety   . Arthritis    hands, lower back, knees  . Asthma   . Bipolar 1 disorder ([place])   . Breast cancer (HCC)    stage 0  . COPD (chronic obstructive pulmonary disease) ([place])   . Depression   . Endometriosis   . Fibromyalgia   . GERD (gastroesophageal reflux disease)   . Headache(784.0)    otc med prn  . Migraine   . Pituitary tumor    microadenoma  . PONV (postoperative nausea and vomiting)   . PTSD (post-traumatic stress disorder)   . Scoliosis   . Termination of pregnancy    x 2 at age 67 and 49 yrs old  . Tobacco abuse 03/04/2015    Past Surgical History:  Procedure Laterality Date  . BREAST BIOPSY Right 05/19/2007  . BREAST BIOPSY Right 06/02/2007  . BREAST BIOPSY  06/29/2019  . BREAST LUMPECTOMY Right 07/21/2019  . BREAST LUMPECTOMY WITH RADIOACTIVE SEED LOCALIZATION Right 07/21/2019   Procedure: RIGHT BREAST LUMPECTOMY WITH RADIOACTIVE SEED LOCALIZATION;  Surgeon: Alphonsa Overall, MD;  Location: [place];  Service: General;  Laterality: Right;  . BREAST SURGERY    . DILATION AND CURETTAGE OF UTERUS    . FACIAL COSMETIC SURGERY     right  cheek  . LAPAROSCOPY Right 11/11/2013   Procedure: LAPAROSCOPY OPERATIVE with  Drainage of  RIGHT Ovarian ENDOMETRIOMA;  Surgeon: Elveria Royals, MD;  Location: [facility];  Service: Gynecology;  Laterality: Right;  . NASAL ENDOSCOPY     said it showed some acid reflux from ENT  . NOSE SURGERY     rhinoplasty at age 82 yrs  . WISDOM TOOTH EXTRACTION     Family History:  Family History  Problem Relation Age of Onset  . Diabetes Mother   . Hypertension Mother    . Stroke Mother 71       CVA  . Mental illness Mother        no diagnosis; personality disorder  . Hyperlipidemia Father   . Hypertension Father   . COPD Father   . Alpha-1 antitrypsin deficiency Father   . Asthma Father   . Alpha-1 antitrypsin deficiency Brother   . Asthma Brother   . Diabetes Maternal Grandmother   . Heart disease Maternal Grandmother   . Hyperlipidemia Maternal Grandmother   . Hypertension Maternal Grandmother   . Mental illness Maternal Grandmother   . Heart disease Maternal Grandfather   . Hyperlipidemia Maternal Grandfather   . Hypertension Maternal Grandfather   . Asthma Maternal Grandfather   . Heart disease Paternal Grandfather   . Hyperlipidemia Paternal Grandfather   . Hypertension Paternal Grandfather   . Stroke Paternal Grandfather   . Alzheimer's disease Paternal Grandmother   . Allergic rhinitis Neg Hx   . Angioedema Neg Hx   . Eczema Neg Hx   . Urticaria Neg Hx   . Immunodeficiency Neg Hx   . Colon cancer Neg Hx   . Esophageal cancer Neg Hx    Family Psychiatric History: None reported Social History:  Social History   Substance and Sexual Activity  Alcohol Use Not Currently     Social History   Substance and Sexual Activity  Drug Use Not Currently  . Types: "Crack" cocaine   Comment: 07/19/2019    Social History   Socioeconomic History  . Marital status: Divorced    Spouse name: Not on file  . Number of children: 0  . Years of education: Not on file  . Highest education level: Bachelor's degree (e.g., BA, AB, BS)  Occupational History  . Occupation: disability    Comment: mental illness  Tobacco Use  . Smoking status: Former Smoker    Packs/day: 2.00    Years: 29.00    Pack years: 58.00    Types: Cigarettes    Quit date: 10/08/2019    Years since quitting: 0.1  . Smokeless tobacco: Never Used  Vaping Use  . Vaping Use: Former  Substance and Sexual Activity  . Alcohol use: Not Currently  . Drug use: Not Currently     Types: "Crack" cocaine    Comment: 07/19/2019  . Sexual activity: Not Currently    Comment: abortion at 63 and 52yrs. of age   Other Topics Concern  . Not on file  Social History Narrative   Marital status: divorced; not dating      Children: none      Lives: alone with mom      Employment: disability for mental illness in 1997.  Hospitalizations x 9 in past.      Tobacco: 1 ppd x since age 28. Decreasing in 2018.      Alcohol: socially; beers.      Drugs:  Not currently; previous in past; cocaine when  manic.      Exercise: yoga daily; exercise daily.      ADLs: independent with ADLs; no car; depends on others for transportation.      Patient is right-handed. She is divorced and lives with her mother. She drinks 2-3 cups of 1/2 caffeine coffe a day. She walks occasionally for exercise.   Social Determinants of Health   Financial Resource Strain:   . Difficulty of Paying Living Expenses: Not on file  Food Insecurity:   . Worried About Charity fundraiser in the Last Year: Not on file  . Ran Out of Food in the Last Year: Not on file  Transportation Needs:   . Lack of Transportation (Medical): Not on file  . Lack of Transportation (Non-Medical): Not on file  Physical Activity:   . Days of Exercise per Week: Not on file  . Minutes of Exercise per Session: Not on file  Stress:   . Feeling of Stress : Not on file  Social Connections:   . Frequency of Communication with Friends and Family: Not on file  . Frequency of Social Gatherings with Friends and Family: Not on file  . Attends Religious Services: Not on file  . Active Member of Clubs or Organizations: Not on file  . Attends Archivist Meetings: Not on file  . Marital Status: Not on file   SDOH:  SDOH Screenings   Alcohol Screen:   . Last Alcohol Screening Score (AUDIT): Not on file  Depression (PHQ2-9): Medium Risk  . PHQ-2 Score: 22  Financial Resource Strain:   . Difficulty of Paying Living Expenses: Not on  file  Food Insecurity:   . Worried About Charity fundraiser in the Last Year: Not on file  . Ran Out of Food in the Last Year: Not on file  Housing:   . Last Housing Risk Score: Not on file  Physical Activity:   . Days of Exercise per Week: Not on file  . Minutes of Exercise per Session: Not on file  Social Connections:   . Frequency of Communication with Friends and Family: Not on file  . Frequency of Social Gatherings with Friends and Family: Not on file  . Attends Religious Services: Not on file  . Active Member of Clubs or Organizations: Not on file  . Attends Archivist Meetings: Not on file  . Marital Status: Not on file  Stress:   . Feeling of Stress : Not on file  Tobacco Use: Medium Risk  . Smoking Tobacco Use: Former Smoker  . Smokeless Tobacco Use: Never Used  Transportation Needs:   . Film/video editor (Medical): Not on file  . Lack of Transportation (Non-Medical): Not on file    Has this patient used any form of tobacco in the last 30 days? (Cigarettes, Smokeless Tobacco, Cigars, and/or Pipes) A prescription for an FDA-approved tobacco cessation medication was offered at discharge and the patient refused  Current Medications:  Current Facility-Administered Medications  Medication Dose Route Frequency Provider Last Rate Last Admin  . alum & mag hydroxide-simeth (MAALOX/MYLANTA) 200-200-20 MG/5ML suspension 30 mL  30 mL Oral Q4H PRN Emmaline Kluver, FNP      . ascorbic acid (VITAMIN C) tablet 500 mg  500 mg Oral TID Emmaline Kluver, FNP   500 mg at 12/09/19 1630  . calcium-vitamin D (OSCAL WITH D) 500-200 MG-UNIT per tablet 1 tablet  1 tablet Oral TID Emmaline Kluver, FNP   1 tablet  at 12/09/19 1630  . [START ON 12/10/2019] gabapentin (NEURONTIN) capsule 100 mg  100 mg Oral Debara Pickett, Burnett Spray L, FNP      . gabapentin (NEURONTIN) tablet 600 mg  600 mg Oral QHS Emmaline Kluver, FNP      . hydrOXYzine (ATARAX/VISTARIL) tablet 25 mg  25 mg Oral TID PRN Emmaline Kluver, FNP       . magnesium hydroxide (MILK OF MAGNESIA) suspension 30 mL  30 mL Oral Daily PRN Emmaline Kluver, FNP      . magnesium oxide (MAG-OX) tablet 400 mg  400 mg Oral Daily Emmaline Kluver, FNP      . QUEtiapine (SEROQUEL) tablet 25 mg  25 mg Oral QHS Emmaline Kluver, FNP       Current Outpatient Medications  Medication Sig Dispense Refill  . albuterol (VENTOLIN HFA) 108 (90 Base) MCG/ACT inhaler Inhale 2 puffs into the lungs every 6 (six) hours as needed for wheezing or shortness of breath. 8 g 12  . ascorbic acid (VITAMIN C) 500 MG tablet Take 500 mg by mouth 3 (three) times daily.    . calcium-vitamin D (OSCAL WITH D) 500-200 MG-UNIT tablet Take 1 tablet by mouth 3 (three) times daily.    . diphenhydrAMINE (BENADRYL) 25 mg capsule Take 25 mg by mouth at bedtime as needed for sleep.    Marland Kitchen EPINEPHrine 0.3 mg/0.3 mL IJ SOAJ injection Inject 0.3 mLs into the skin once.  2  . gabapentin (NEURONTIN) 100 MG capsule Take 100 mg by mouth 2 (two) times daily at 8am and 2pm.    . gabapentin (NEURONTIN) 600 MG tablet Take 600 mg by mouth at bedtime.    Marland Kitchen HYDROcodone-acetaminophen (NORCO/VICODIN) 5-325 MG tablet Take 1 tablet by mouth every 6 (six) hours as needed. Severe pain    . ibuprofen (ADVIL) 800 MG tablet Take 800 mg by mouth 3 (three) times daily.    Marland Kitchen MAGNESIUM-OXIDE 400 (241.3 Mg) MG tablet Take 1 tablet by mouth daily.    . Melatonin 10 MG TABS Take 10 mg by mouth at bedtime.      PTA Medications: (Not in a hospital admission)   Musculoskeletal  Strength & Muscle Tone: within normal limits Gait & Station: normal Patient leans: N/A  Psychiatric Specialty Exam  Presentation  General Appearance: Appropriate for Environment;Casual;Well Groomed  Eye Contact:Good  Speech:Normal Rate  Speech Volume:Normal  Handedness:Right   Mood and Affect  Mood:Anxious  Affect:Congruent   Thought Process  Thought Processes:Coherent;Goal Directed  Descriptions of  Associations:Intact  Orientation:Full (Time, Place and Person)  Thought Content:Logical  Hallucinations:Hallucinations: None  Ideas of Reference:None  Suicidal Thoughts:Suicidal Thoughts: Yes, Active SI Active Intent and/or Plan: With Intent;With Plan;With Means to Carry Out  Homicidal Thoughts:Homicidal Thoughts: No   Sensorium  Memory:Immediate Good;Recent Good;Remote Good  Judgment:Good  Insight:Fair   Executive Functions  Concentration:Good  Attention Span:Good  [place] of Knowledge:Good  Language:Good   Psychomotor Activity  Psychomotor Activity:Psychomotor Activity: Normal   Assets  Assets:Communication Skills;Desire for Improvement;Financial Resources/Insurance;Housing;Intimacy;Leisure Time;Physical Health;Resilience;Social Support;Talents/Skills;Transportation   Sleep  Sleep:Sleep: Fair   Physical Exam  Physical Exam ROS Blood pressure 107/88, pulse 90, temperature 97.6 F (36.4 C), temperature source Oral, resp. rate 18, height 5\' 8"  (1.727 m), weight 138 lb (62.6 kg), last menstrual period 11/21/2014. Body mass index is 20.98 kg/m.  Demographic Factors:  Caucasian  Loss Factors: NA  Historical Factors: NA  Risk Reduction Factors:   Religious beliefs about death, Living with  another person, especially a relative, Positive social support, Positive therapeutic relationship and Positive coping skills or problem solving skills  Continued Clinical Symptoms:  Alcohol/Substance Abuse/Dependencies  Cognitive Features That Contribute To Risk:  None    Suicide Risk:  Minimal: No identifiable suicidal ideation.  Patients presenting with no risk factors but with morbid ruminations; may be classified as minimal risk based on the severity of the depressive symptoms  Plan Of Care/Follow-up recommendations:  Other:  Patient reviewed with Dr. Hampton Abbot.  Follow-up with established outpatient psychiatrist and  counselor.  Disposition: Patient will be discharged AMA.  Emmaline Kluver, FNP 12/09/2019, 6:49 PM

## 2019-12-10 LAB — PROLACTIN: Prolactin: 7.6 ng/mL (ref 4.8–23.3)

## 2019-12-14 ENCOUNTER — Encounter (HOSPITAL_COMMUNITY): Payer: Self-pay | Admitting: Registered Nurse

## 2019-12-14 ENCOUNTER — Ambulatory Visit (HOSPITAL_COMMUNITY)
Admission: EM | Admit: 2019-12-14 | Discharge: 2019-12-15 | Disposition: A | Discharge status: Home / Self Care | Payer: Medicare Other | Attending: Registered Nurse | Admitting: Registered Nurse

## 2019-12-14 ENCOUNTER — Other Ambulatory Visit: Payer: Self-pay

## 2019-12-14 DIAGNOSIS — R45851 Suicidal ideations: Secondary | ICD-10-CM | POA: Insufficient documentation

## 2019-12-14 DIAGNOSIS — F419 Anxiety disorder, unspecified: Secondary | ICD-10-CM | POA: Insufficient documentation

## 2019-12-14 DIAGNOSIS — Z20822 Contact with and (suspected) exposure to covid-19: Secondary | ICD-10-CM | POA: Diagnosis not present

## 2019-12-14 DIAGNOSIS — Z87891 Personal history of nicotine dependence: Secondary | ICD-10-CM | POA: Insufficient documentation

## 2019-12-14 DIAGNOSIS — F1410 Cocaine abuse, uncomplicated: Secondary | ICD-10-CM | POA: Insufficient documentation

## 2019-12-14 DIAGNOSIS — Z79899 Other long term (current) drug therapy: Secondary | ICD-10-CM | POA: Diagnosis not present

## 2019-12-14 DIAGNOSIS — F3162 Bipolar disorder, current episode mixed, moderate: Secondary | ICD-10-CM | POA: Diagnosis not present

## 2019-12-14 LAB — POC SARS CORONAVIRUS 2 AG -  ED: SARS Coronavirus 2 Ag: NEGATIVE

## 2019-12-14 LAB — COMPREHENSIVE METABOLIC PANEL
ALT: 16 U/L (ref 0–44)
AST: 15 U/L (ref 15–41)
Albumin: 4 g/dL (ref 3.5–5.0)
Alkaline Phosphatase: 57 U/L (ref 38–126)
Anion gap: 11 (ref 5–15)
BUN: 10 mg/dL (ref 6–20)
CO2: 24 mmol/L (ref 22–32)
Calcium: 9.3 mg/dL (ref 8.9–10.3)
Chloride: 104 mmol/L (ref 98–111)
Creatinine, Ser: 0.65 mg/dL (ref 0.44–1.00)
GFR calc Af Amer: >60 mL/min (ref >60)
GFR calc non Af Amer: >60 mL/min (ref >60)
Glucose, Bld: 133 mg/dL — ABNORMAL HIGH (ref 70–99)
Potassium: 3.6 mmol/L (ref 3.5–5.1)
Sodium: 139 mmol/L (ref 135–145)
Total Bilirubin: 0.1 mg/dL — ABNORMAL LOW (ref 0.3–1.2)
Total Protein: 6.3 g/dL — ABNORMAL LOW (ref 6.5–8.1)

## 2019-12-14 LAB — POCT URINALYSIS DIP (DEVICE)
Bilirubin Urine: NEGATIVE
Glucose, UA: NEGATIVE mg/dL
Hgb urine dipstick: NEGATIVE
Ketones, ur: NEGATIVE mg/dL
Leukocytes,Ua: NEGATIVE
Nitrite: NEGATIVE
Protein, ur: NEGATIVE mg/dL
Specific Gravity, Urine: 1.02 (ref 1.005–1.030)
Urobilinogen, UA: 0.2 mg/dL (ref 0.0–1.0)
pH: 7.5 (ref 5.0–8.0)

## 2019-12-14 LAB — POCT URINE DRUG SCREEN - MANUAL ENTRY (I-SCREEN)
POC Amphetamine UR: NOT DETECTED
POC Buprenorphine (BUP): NOT DETECTED
POC Cocaine UR: POSITIVE — AB
POC Marijuana UR: NOT DETECTED
POC Methadone UR: NOT DETECTED
POC Methamphetamine UR: NOT DETECTED
POC Morphine: NOT DETECTED
POC Oxazepam (BZO): NOT DETECTED
POC Oxycodone UR: NOT DETECTED
POC Secobarbital (BAR): NOT DETECTED

## 2019-12-14 LAB — SARS CORONAVIRUS 2 BY RT PCR (HOSPITAL ORDER, PERFORMED IN ~~LOC~~ HOSPITAL LAB): SARS Coronavirus 2: NEGATIVE

## 2019-12-14 MED ORDER — MELATONIN 5 MG PO TABS
10.0000 mg | ORAL_TABLET | Freq: Every evening | ORAL | Status: DC | PRN
  Administered 2019-12-14: 10 mg via ORAL
  Filled 2019-12-14: qty 2

## 2019-12-14 MED ORDER — IBUPROFEN 800 MG PO TABS
800.0000 mg | ORAL_TABLET | Freq: Three times a day (TID) | ORAL | Status: DC
  Administered 2019-12-14 – 2019-12-15 (×2): 800 mg via ORAL
  Filled 2019-12-14 (×2): qty 1

## 2019-12-14 MED ORDER — HYDROXYZINE HCL 25 MG PO TABS
25.0000 mg | ORAL_TABLET | Freq: Three times a day (TID) | ORAL | Status: DC | PRN
  Administered 2019-12-14: 25 mg via ORAL
  Filled 2019-12-14: qty 1

## 2019-12-14 MED ORDER — GABAPENTIN 100 MG PO CAPS
100.0000 mg | ORAL_CAPSULE | ORAL | Status: DC
  Filled 2019-12-14: qty 14
  Filled 2019-12-14: qty 1

## 2019-12-14 MED ORDER — QUETIAPINE FUMARATE 25 MG PO TABS
25.0000 mg | ORAL_TABLET | Freq: Once | ORAL | Status: AC
  Administered 2019-12-14: 25 mg via ORAL
  Filled 2019-12-14: qty 1

## 2019-12-14 MED ORDER — TRAZODONE HCL 50 MG PO TABS
50.0000 mg | ORAL_TABLET | Freq: Once | ORAL | Status: DC
  Filled 2019-12-14: qty 1

## 2019-12-14 MED ORDER — GABAPENTIN 600 MG PO TABS
600.0000 mg | ORAL_TABLET | Freq: Every day | ORAL | Status: DC
  Administered 2019-12-14: 600 mg via ORAL
  Filled 2019-12-14: qty 7
  Filled 2019-12-14: qty 1

## 2019-12-14 NOTE — ED Provider Notes (Signed)
Behavioral Health Admission H&P [facility] & OBS)  Date: 12/14/19 Patient Name: Catherine Olsen MRN: [id number] Chief Complaint: No chief complaint on file.     Diagnoses:  Final diagnoses:  Bipolar 1 disorder, mixed, moderate (HCC)  Chronic prescription benzodiazepine use  Suicidal ideation  Cocaine abuse ([place])    HPI:  Catherine Olsen, 49 y.o., female patient presented to [facility] as a walk in with complaints of worsening depression and suicidal ideation and that she needs to be admitted to psychiatric hospital.  Patient unable to contract for safety if she is discharged home.    Will admit to continuous assessment for safety and reassess tomorrow morning  Will restart home medications.  History of cocaine abuse and benzo dependence and misuse.     PHQ 2-9:    ED from 12/09/2019 in [facility] Office Visit from 12/01/2018 in Primary Care at [facility] Visit from 01/20/2018 in Primary Care at [place] that you would be better off dead, or of hurting yourself in some way Nearly every day Not at all Not at all  PHQ-9 Total Score 22 9 --        ED from 12/09/2019 in [facility] Admission (Discharged) from 09/08/2018 in [place] ED from 09/07/2018 in [place] CATEGORY High Risk High Risk High Risk       Total Time spent with patient: 30 minutes  Musculoskeletal  Strength & Muscle Tone: within normal limits Gait & Station: normal Patient leans: N/A  Psychiatric Specialty Exam  Presentation General Appearance: Disheveled (Dressed in house coat, hair disheveled)  Eye Contact:Good  Speech:Normal Rate  Speech Volume:Normal  Handedness:Right   Mood and Affect  Mood:Anxious;Depressed;Labile  Affect:Labile   Thought Process  Thought Processes:Irrevelant  Descriptions of Associations:Tangential  Orientation:Full (Time, Place and  Person)  Thought Content:Rumination;Tangential  Hallucinations:Hallucinations: None  Ideas of Reference:None  Suicidal Thoughts:Suicidal Thoughts: Yes, Passive SI Active Intent and/or Plan: Without Intent;With Plan;With Means to Carry Out;With Access to Means SI Passive Intent and/or Plan: With Plan;With Means to Carry Out;With Access to Means  Homicidal Thoughts:Homicidal Thoughts: No   Sensorium  Memory:Immediate Good;Recent Good;Remote Good  Judgment:Fair  Insight:Present   Executive Functions  Concentration:Fair  Attention Span:Fair  [place] of Knowledge:Good  Language:Good   Psychomotor Activity  Psychomotor Activity:Psychomotor Activity: Normal   Assets  Assets:Communication Skills;Desire for Improvement;Housing   Sleep  Sleep:Sleep: Fair   Physical Exam ROS  Blood pressure 116/90, pulse 83, temperature 97.9 F (36.6 C), temperature source Oral, resp. rate 18, height 5\' 8"  (1.727 m), weight 137 lb (62.1 kg), last menstrual period 11/21/2014, SpO2 100 %. Body mass index is 20.83 kg/m.  Past Psychiatric History: See above    Is the patient at risk to self? Yes  Has the patient been a risk to self in the past 6 months? Yes .    Has the patient been a risk to self within the distant past? Yes   Is the patient a risk to others? No   Has the patient been a risk to others in the past 6 months? No   Has the patient been a risk to others within the distant past? No   Past Medical History:  Past Medical History:  Diagnosis Date  . Abnormal uterine bleeding (AUB) 06/25/2009   Qualifier: Diagnosis of  By: Cathren Laine MD, Ankit    . Allergy   .  Anxiety   . Arthritis    hands, lower back, knees  . Asthma   . Bipolar 1 disorder ([place])   . Breast cancer (HCC)    stage 0  . COPD (chronic obstructive pulmonary disease) ([place])   . Depression   . Endometriosis   . Fibromyalgia   . GERD (gastroesophageal reflux disease)   . Headache(784.0)    otc  med prn  . Migraine   . Pituitary tumor    microadenoma  . PONV (postoperative nausea and vomiting)   . PTSD (post-traumatic stress disorder)   . Scoliosis   . Termination of pregnancy    x 2 at age 34 and 49 yrs old  . Tobacco abuse 03/04/2015    Past Surgical History:  Procedure Laterality Date  . BREAST BIOPSY Right 05/19/2007  . BREAST BIOPSY Right 06/02/2007  . BREAST BIOPSY  06/29/2019  . BREAST LUMPECTOMY Right 07/21/2019  . BREAST LUMPECTOMY WITH RADIOACTIVE SEED LOCALIZATION Right 07/21/2019   Procedure: RIGHT BREAST LUMPECTOMY WITH RADIOACTIVE SEED LOCALIZATION;  Surgeon: Alphonsa Overall, MD;  Location: [place];  Service: General;  Laterality: Right;  . BREAST SURGERY    . DILATION AND CURETTAGE OF UTERUS    . FACIAL COSMETIC SURGERY     right cheek  . LAPAROSCOPY Right 11/11/2013   Procedure: LAPAROSCOPY OPERATIVE with  Drainage of  RIGHT Ovarian ENDOMETRIOMA;  Surgeon: Elveria Royals, MD;  Location: [facility];  Service: Gynecology;  Laterality: Right;  . NASAL ENDOSCOPY     said it showed some acid reflux from ENT  . NOSE SURGERY     rhinoplasty at age 15 yrs  . WISDOM TOOTH EXTRACTION      Family History:  Family History  Problem Relation Age of Onset  . Diabetes Mother   . Hypertension Mother   . Stroke Mother 42       CVA  . Mental illness Mother        no diagnosis; personality disorder  . Hyperlipidemia Father   . Hypertension Father   . COPD Father   . Alpha-1 antitrypsin deficiency Father   . Asthma Father   . Alpha-1 antitrypsin deficiency Brother   . Asthma Brother   . Diabetes Maternal Grandmother   . Heart disease Maternal Grandmother   . Hyperlipidemia Maternal Grandmother   . Hypertension Maternal Grandmother   . Mental illness Maternal Grandmother   . Heart disease Maternal Grandfather   . Hyperlipidemia Maternal Grandfather   . Hypertension Maternal Grandfather   . Asthma Maternal Grandfather   . Heart disease Paternal  Grandfather   . Hyperlipidemia Paternal Grandfather   . Hypertension Paternal Grandfather   . Stroke Paternal Grandfather   . Alzheimer's disease Paternal Grandmother   . Allergic rhinitis Neg Hx   . Angioedema Neg Hx   . Eczema Neg Hx   . Urticaria Neg Hx   . Immunodeficiency Neg Hx   . Colon cancer Neg Hx   . Esophageal cancer Neg Hx     Social History:  Social History   Socioeconomic History  . Marital status: Divorced    Spouse name: Not on file  . Number of children: 0  . Years of education: Not on file  . Highest education level: Bachelor's degree (e.g., BA, AB, BS)  Occupational History  . Occupation: disability    Comment: mental illness  Tobacco Use  . Smoking status: Former Smoker    Packs/day: 2.00    Years: 29.00  Pack years: 58.00    Types: Cigarettes    Quit date: 10/08/2019    Years since quitting: 0.1  . Smokeless tobacco: Never Used  Vaping Use  . Vaping Use: Former  Substance and Sexual Activity  . Alcohol use: Not Currently  . Drug use: Not Currently    Types: "Crack" cocaine    Comment: 07/19/2019  . Sexual activity: Not Currently    Comment: abortion at 88 and 42yrs. of age   Other Topics Concern  . Not on file  Social History Narrative   Marital status: divorced; not dating      Children: none      Lives: alone with mom      Employment: disability for mental illness in 1997.  Hospitalizations x 9 in past.      Tobacco: 1 ppd x since age 39. Decreasing in 2018.      Alcohol: socially; beers.      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      Exercise: yoga daily; exercise daily.      ADLs: independent with ADLs; no car; depends on others for transportation.      Patient is right-handed. She is divorced and lives with her mother. She drinks 2-3 cups of 1/2 caffeine coffe a day. She walks occasionally for exercise.   Social Determinants of Health   Financial Resource Strain:   . Difficulty of Paying Living Expenses:  Not on file  Food Insecurity:   . Worried About Charity fundraiser in the Last Year: Not on file  . Ran Out of Food in the Last Year: Not on file  Transportation Needs:   . Lack of Transportation (Medical): Not on file  . Lack of Transportation (Non-Medical): Not on file  Physical Activity:   . Days of Exercise per Week: Not on file  . Minutes of Exercise per Session: Not on file  Stress:   . Feeling of Stress : Not on file  Social Connections:   . Frequency of Communication with Friends and Family: Not on file  . Frequency of Social Gatherings with Friends and Family: Not on file  . Attends Religious Services: Not on file  . Active Member of Clubs or Organizations: Not on file  . Attends Archivist Meetings: Not on file  . Marital Status: Not on file  Intimate Partner Violence:   . Fear of Current or Ex-Partner: Not on file  . Emotionally Abused: Not on file  . Physically Abused: Not on file  . Sexually Abused: Not on file    SDOH:  SDOH Screenings   Alcohol Screen:   . Last Alcohol Screening Score (AUDIT): Not on file  Depression (PHQ2-9): Medium Risk  . PHQ-2 Score: 22  Financial Resource Strain:   . Difficulty of Paying Living Expenses: Not on file  Food Insecurity:   . Worried About Charity fundraiser in the Last Year: Not on file  . Ran Out of Food in the Last Year: Not on file  Housing:   . Last Housing Risk Score: Not on file  Physical Activity:   . Days of Exercise per Week: Not on file  . Minutes of Exercise per Session: Not on file  Social Connections:   . Frequency of Communication with Friends and Family: Not on file  . Frequency of Social Gatherings with Friends and Family: Not on file  . Attends Religious Services: Not on file  . Active Member  of Clubs or Organizations: Not on file  . Attends Archivist Meetings: Not on file  . Marital Status: Not on file  Stress:   . Feeling of Stress : Not on file  Tobacco Use: Medium Risk  .  Smoking Tobacco Use: Former Smoker  . Smokeless Tobacco Use: Never Used  Transportation Needs:   . Film/video editor (Medical): Not on file  . Lack of Transportation (Non-Medical): Not on file    Last Labs:    Allergies: Other, Prednisone, Sulfamethoxazole, Oxycodone, Abilify [aripiprazole], Celebrex [celecoxib], Penicillins, Tylenol [acetaminophen], Percocet [oxycodone-acetaminophen], Sulfa antibiotics, and Sulfites  PTA Medications: (Not in a hospital admission)   Medical Decision Making  Admitted to continuous assessment Labs/EKG ordered Home medications ordered    Recommendations  Based on my evaluation the patient does not appear to have an emergency medical condition.  Nikkolas Coomes, NP 12/14/19  5:22 PM

## 2019-12-14 NOTE — ED Triage Notes (Signed)
Pt arrived via [facility] c/o ongoing depression and SI with plan. Pt found lying on floor, c/o of HA & needing to lie down. Anxious and frustrated.

## 2019-12-14 NOTE — ED Notes (Signed)
Belongings in locker 25

## 2019-12-14 NOTE — ED Notes (Signed)
Patient is at med window for her meds

## 2019-12-14 NOTE — ED Notes (Signed)
Patient is on the phone
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
## 2019-12-15 ENCOUNTER — Emergency Department (HOSPITAL_COMMUNITY)
Admission: EM | Admit: 2019-12-15 | Discharge: 2019-12-15 | Disposition: A | Discharge status: Other Acute Inpatient Hospital | Payer: Medicare Other | Attending: Emergency Medicine | Admitting: Emergency Medicine

## 2019-12-15 ENCOUNTER — Ambulatory Visit (HOSPITAL_COMMUNITY)
Admission: EM | Admit: 2019-12-15 | Discharge: 2019-12-16 | Disposition: A | Discharge status: Home / Self Care | Payer: Medicare Other | Attending: Behavioral Health | Admitting: Behavioral Health

## 2019-12-15 ENCOUNTER — Other Ambulatory Visit: Payer: Self-pay

## 2019-12-15 DIAGNOSIS — Z880 Allergy status to penicillin: Secondary | ICD-10-CM | POA: Diagnosis not present

## 2019-12-15 DIAGNOSIS — J45902 Unspecified asthma with status asthmaticus: Secondary | ICD-10-CM | POA: Diagnosis not present

## 2019-12-15 DIAGNOSIS — M419 Scoliosis, unspecified: Secondary | ICD-10-CM | POA: Insufficient documentation

## 2019-12-15 DIAGNOSIS — Z20822 Contact with and (suspected) exposure to covid-19: Secondary | ICD-10-CM | POA: Diagnosis not present

## 2019-12-15 DIAGNOSIS — F4310 Post-traumatic stress disorder, unspecified: Secondary | ICD-10-CM | POA: Insufficient documentation

## 2019-12-15 DIAGNOSIS — Z791 Long term (current) use of non-steroidal anti-inflammatories (NSAID): Secondary | ICD-10-CM | POA: Diagnosis not present

## 2019-12-15 DIAGNOSIS — F419 Anxiety disorder, unspecified: Secondary | ICD-10-CM | POA: Insufficient documentation

## 2019-12-15 DIAGNOSIS — Z881 Allergy status to other antibiotic agents status: Secondary | ICD-10-CM | POA: Diagnosis not present

## 2019-12-15 DIAGNOSIS — J449 Chronic obstructive pulmonary disease, unspecified: Secondary | ICD-10-CM | POA: Diagnosis not present

## 2019-12-15 DIAGNOSIS — Z885 Allergy status to narcotic agent status: Secondary | ICD-10-CM | POA: Diagnosis not present

## 2019-12-15 DIAGNOSIS — F29 Unspecified psychosis not due to a substance or known physiological condition: Secondary | ICD-10-CM | POA: Diagnosis not present

## 2019-12-15 DIAGNOSIS — T50992A Poisoning by other drugs, medicaments and biological substances, intentional self-harm, initial encounter: Secondary | ICD-10-CM | POA: Diagnosis not present

## 2019-12-15 DIAGNOSIS — T50902A Poisoning by unspecified drugs, medicaments and biological substances, intentional self-harm, initial encounter: Secondary | ICD-10-CM

## 2019-12-15 DIAGNOSIS — M797 Fibromyalgia: Secondary | ICD-10-CM | POA: Insufficient documentation

## 2019-12-15 DIAGNOSIS — Z888 Allergy status to other drugs, medicaments and biological substances status: Secondary | ICD-10-CM | POA: Diagnosis not present

## 2019-12-15 DIAGNOSIS — F3162 Bipolar disorder, current episode mixed, moderate: Secondary | ICD-10-CM | POA: Diagnosis not present

## 2019-12-15 DIAGNOSIS — F1410 Cocaine abuse, uncomplicated: Secondary | ICD-10-CM | POA: Insufficient documentation

## 2019-12-15 DIAGNOSIS — Z87891 Personal history of nicotine dependence: Secondary | ICD-10-CM | POA: Insufficient documentation

## 2019-12-15 DIAGNOSIS — G43909 Migraine, unspecified, not intractable, without status migrainosus: Secondary | ICD-10-CM | POA: Insufficient documentation

## 2019-12-15 DIAGNOSIS — K219 Gastro-esophageal reflux disease without esophagitis: Secondary | ICD-10-CM | POA: Insufficient documentation

## 2019-12-15 DIAGNOSIS — Z79899 Other long term (current) drug therapy: Secondary | ICD-10-CM | POA: Insufficient documentation

## 2019-12-15 DIAGNOSIS — D0511 Intraductal carcinoma in situ of right breast: Secondary | ICD-10-CM | POA: Diagnosis not present

## 2019-12-15 DIAGNOSIS — R45851 Suicidal ideations: Secondary | ICD-10-CM | POA: Insufficient documentation

## 2019-12-15 DIAGNOSIS — D352 Benign neoplasm of pituitary gland: Secondary | ICD-10-CM | POA: Insufficient documentation

## 2019-12-15 DIAGNOSIS — T43592A Poisoning by other antipsychotics and neuroleptics, intentional self-harm, initial encounter: Secondary | ICD-10-CM | POA: Insufficient documentation

## 2019-12-15 DIAGNOSIS — Z882 Allergy status to sulfonamides status: Secondary | ICD-10-CM | POA: Diagnosis not present

## 2019-12-15 DIAGNOSIS — F3112 Bipolar disorder, current episode manic without psychotic features, moderate: Secondary | ICD-10-CM | POA: Diagnosis not present

## 2019-12-15 DIAGNOSIS — T1491XA Suicide attempt, initial encounter: Secondary | ICD-10-CM | POA: Insufficient documentation

## 2019-12-15 DIAGNOSIS — M1990 Unspecified osteoarthritis, unspecified site: Secondary | ICD-10-CM | POA: Insufficient documentation

## 2019-12-15 DIAGNOSIS — R9431 Abnormal electrocardiogram [ECG] [EKG]: Secondary | ICD-10-CM | POA: Diagnosis not present

## 2019-12-15 LAB — RAPID URINE DRUG SCREEN, HOSP PERFORMED
Amphetamines: NOT DETECTED
Barbiturates: NOT DETECTED
Benzodiazepines: NOT DETECTED
Cocaine: POSITIVE — AB
Opiates: NOT DETECTED
Tetrahydrocannabinol: NOT DETECTED

## 2019-12-15 LAB — COMPREHENSIVE METABOLIC PANEL
ALT: 15 U/L (ref 0–44)
AST: 16 U/L (ref 15–41)
Albumin: 4.3 g/dL (ref 3.5–5.0)
Alkaline Phosphatase: 60 U/L (ref 38–126)
Anion gap: 8 (ref 5–15)
BUN: 15 mg/dL (ref 6–20)
CO2: 25 mmol/L (ref 22–32)
Calcium: 8.9 mg/dL (ref 8.9–10.3)
Chloride: 108 mmol/L (ref 98–111)
Creatinine, Ser: 0.65 mg/dL (ref 0.44–1.00)
GFR calc Af Amer: >60 mL/min (ref >60)
GFR calc non Af Amer: >60 mL/min (ref >60)
Glucose, Bld: 106 mg/dL — ABNORMAL HIGH (ref 70–99)
Potassium: 4 mmol/L (ref 3.5–5.1)
Sodium: 141 mmol/L (ref 135–145)
Total Bilirubin: 0.6 mg/dL (ref 0.3–1.2)
Total Protein: 6.7 g/dL (ref 6.5–8.1)

## 2019-12-15 LAB — BASIC METABOLIC PANEL
Anion gap: 11 (ref 5–15)
BUN: 13 mg/dL (ref 6–20)
CO2: 24 mmol/L (ref 22–32)
Calcium: 9.1 mg/dL (ref 8.9–10.3)
Chloride: 109 mmol/L (ref 98–111)
Creatinine, Ser: 0.61 mg/dL (ref 0.44–1.00)
GFR calc Af Amer: >60 mL/min (ref >60)
GFR calc non Af Amer: >60 mL/min (ref >60)
Glucose, Bld: 119 mg/dL — ABNORMAL HIGH (ref 70–99)
Potassium: 4.1 mmol/L (ref 3.5–5.1)
Sodium: 144 mmol/L (ref 135–145)

## 2019-12-15 LAB — SARS CORONAVIRUS 2 BY RT PCR (HOSPITAL ORDER, PERFORMED IN ~~LOC~~ HOSPITAL LAB): SARS Coronavirus 2: NEGATIVE

## 2019-12-15 LAB — CBC WITH DIFFERENTIAL/PLATELET
Abs Immature Granulocytes: 0.01 10*3/uL (ref 0.00–0.07)
Basophils Absolute: 0 10*3/uL (ref 0.0–0.1)
Basophils Relative: 1 %
Eosinophils Absolute: 0.5 10*3/uL (ref 0.0–0.5)
Eosinophils Relative: 9 %
HCT: 40.3 % (ref 36.0–46.0)
Hemoglobin: 13.8 g/dL (ref 12.0–15.0)
Immature Granulocytes: 0 %
Lymphocytes Relative: 35 %
Lymphs Abs: 1.9 10*3/uL (ref 0.7–4.0)
MCH: 32.7 pg (ref 26.0–34.0)
MCHC: 34.2 g/dL (ref 30.0–36.0)
MCV: 95.5 fL (ref 80.0–100.0)
Monocytes Absolute: 0.4 10*3/uL (ref 0.1–1.0)
Monocytes Relative: 7 %
Neutro Abs: 2.5 10*3/uL (ref 1.7–7.7)
Neutrophils Relative %: 48 %
Platelets: 218 10*3/uL (ref 150–400)
RBC: 4.22 MIL/uL (ref 3.87–5.11)
RDW: 13.9 % (ref 11.5–15.5)
WBC: 5.3 10*3/uL (ref 4.0–10.5)
nRBC: 0 % (ref 0.0–0.2)

## 2019-12-15 LAB — MAGNESIUM: Magnesium: 2 mg/dL (ref 1.7–2.4)

## 2019-12-15 LAB — ACETAMINOPHEN LEVEL
Acetaminophen (Tylenol), Serum: <10 ug/mL — ABNORMAL LOW (ref 10–30)
Acetaminophen (Tylenol), Serum: <10 ug/mL — ABNORMAL LOW (ref 10–30)

## 2019-12-15 LAB — SALICYLATE LEVEL
Salicylate Lvl: <7 mg/dL — ABNORMAL LOW (ref 7.0–30.0)
Salicylate Lvl: <7 mg/dL — ABNORMAL LOW (ref 7.0–30.0)

## 2019-12-15 LAB — PREGNANCY, URINE: Preg Test, Ur: NEGATIVE

## 2019-12-15 LAB — CBG MONITORING, ED: Glucose-Capillary: 110 mg/dL — ABNORMAL HIGH (ref 70–99)

## 2019-12-15 LAB — ETHANOL
Alcohol, Ethyl (B): <10 mg/dL (ref <10)
Alcohol, Ethyl (B): <10 mg/dL (ref <10)

## 2019-12-15 MED ORDER — SODIUM CHLORIDE 0.9 % IV BOLUS
1000.0000 mL | Freq: Once | INTRAVENOUS | Status: AC
  Administered 2019-12-15: 1000 mL via INTRAVENOUS

## 2019-12-15 MED ORDER — QUETIAPINE FUMARATE 200 MG PO TABS: 200.0000 mg | ORAL_TABLET | Freq: Every day | ORAL | Status: DC

## 2019-12-15 MED ORDER — QUETIAPINE FUMARATE 25 MG PO TABS
25.0000 mg | ORAL_TABLET | Freq: Two times a day (BID) | ORAL | Status: DC
  Administered 2019-12-15: 25 mg via ORAL
  Filled 2019-12-15: qty 14
  Filled 2019-12-15: qty 1

## 2019-12-15 MED ORDER — QUETIAPINE FUMARATE 25 MG PO TABS: 25.0000 mg | ORAL_TABLET | Freq: Two times a day (BID) | ORAL | 0 refills | Status: DC

## 2019-12-15 MED ORDER — GABAPENTIN 100 MG PO CAPS: 100.0000 mg | ORAL_CAPSULE | ORAL | 0 refills | Status: DC

## 2019-12-15 MED ORDER — GABAPENTIN 600 MG PO TABS: 600.0000 mg | ORAL_TABLET | Freq: Every day | ORAL | 0 refills | Status: DC

## 2019-12-15 MED ORDER — CHARCOAL ACTIVATED PO LIQD
62.0000 g | Freq: Once | ORAL | Status: AC
  Administered 2019-12-15: 62 g via ORAL
  Filled 2019-12-15: qty 480

## 2019-12-15 MED ORDER — QUETIAPINE FUMARATE 200 MG PO TABS: 200.0000 mg | ORAL_TABLET | Freq: Every day | ORAL | 0 refills | Status: DC

## 2019-12-15 NOTE — ED Notes (Signed)
Pt awake sitting up on bed. Denies concerns at present. Bkft offered. Pt requested decaf coffee. Safety maintained.

## 2019-12-15 NOTE — ED Notes (Signed)
Safe transport called for transport. 
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
## 2019-12-15 NOTE — ED Notes (Signed)
Patient discharged.

## 2019-12-15 NOTE — ED Notes (Signed)
Attempted to call report to [facility] nurse to return call when available.

## 2019-12-15 NOTE — ED Notes (Signed)
2nd call to [facility] to give report  Nurse unable to take at this time

## 2019-12-15 NOTE — ED Notes (Signed)
Patient is very agitated and non-compliant.  She is refusing to let the iv run,  She is stating she is dizzy but refusing to stay in the bed.  She is mad that we would not let her mother back until we were through taking care of her medical needs.    She is continuously taking off the leads.

## 2019-12-15 NOTE — ED Provider Notes (Signed)
FBC/OBS ASAP Discharge Summary  Date and Time: 12/15/2019 4:35 PM  Name: Catherine Olsen  MRN:  [id number]   Discharge Diagnoses:  Final diagnoses:  Bipolar 1 disorder, mixed, moderate ([place])  Chronic prescription benzodiazepine use  Suicidal ideation  Cocaine abuse ([place])    Subjective: Patient reports this morning that she is doing okay but feels that she needs a different medication to assist with her mood.  She states that she has had various issues with medications in the past.  She reports that lithium caused blood clots, Abilify caused her endometriosis to worsen, Latuda caused her body to stop fighting off infections, and that she had ended up with breast cancer.  Patient stated she was highly concerned about new medications even after discussing various medications.  Patient reports that she feels that Seroquel worked well for her in the past but felt that it needed to be a little bit stronger dose.  Patient reports that she was taken 150 mg at bedtime in the past.  Patient denied having any suicidal or homicidal ideations and denied any hallucinations.  Patient stated she felt safe to discharge home with her mother if she was able to restart her medications.  Patient was in agreement with starting Seroquel 200 mg p.o. nightly and 25 mg p.o. twice daily and continue her Neurontin.  Stay Summary: Patient is a 49 year old female who presented to the [facility] as a walk-in with complaints of worsening depression patient was admitted to the continuous assessment for overnight observation.  Patient's Neurontin was restarted last night.  Patient was reassessed this morning and discussed medications.  Patient stated that with medications restarted that she would feel safe "discharging home to her mother.  Agreement was made on restarting Seroquel 200 mg p.o. nightly and 25 mg p.o. twice daily to assist with mood stability and anxiety.  Patient was in agreement with also continuing her Neurontin.  Patient  contacted her mother to pick her up from the [facility] and was provided with samples as well as prescriptions for her medications.  The patient stated that she needed samples because that she was broke and her and her mother could not afford to pay for her prescriptions at this time.  Patient was informed that she was only allowed to get a 7-day sample and that she would have 7 days to pick up her medication.  Patient stated that would give her enough time to work on coming up with the [place] to pay for it.  Patient's prescriptions were sent to pharmacy of choice.  Patient's mother arrived at the [facility] to pick her up.  Patient had continued to deny any suicidal homicidal ideations and denied any hallucinations. Patient has appointment scheduled with [facility] with therapy.  Total Time spent with patient: 30 minutes  Past Psychiatric History: Bipolar I disorder, MDD, anxiety Past Medical History:  Past Medical History:  Diagnosis Date  . Abnormal uterine bleeding (AUB) 06/25/2009   Qualifier: Diagnosis of  By: Cathren Laine MD, Ankit    . Allergy   . Anxiety   . Arthritis    hands, lower back, knees  . Asthma   . Bipolar 1 disorder ([place])   . Breast cancer (HCC)    stage 0  . COPD (chronic obstructive pulmonary disease) ([place])   . Depression   . Endometriosis   . Fibromyalgia   . GERD (gastroesophageal reflux disease)   . Headache(784.0)    otc med prn  . Migraine   .  Pituitary tumor    microadenoma  . PONV (postoperative nausea and vomiting)   . PTSD (post-traumatic stress disorder)   . Scoliosis   . Termination of pregnancy    x 2 at age 85 and 49 yrs old  . Tobacco abuse 03/04/2015    Past Surgical History:  Procedure Laterality Date  . BREAST BIOPSY Right 05/19/2007  . BREAST BIOPSY Right 06/02/2007  . BREAST BIOPSY  06/29/2019  . BREAST LUMPECTOMY Right 07/21/2019  . BREAST LUMPECTOMY WITH RADIOACTIVE SEED LOCALIZATION Right 07/21/2019   Procedure: RIGHT BREAST LUMPECTOMY WITH RADIOACTIVE SEED  LOCALIZATION;  Surgeon: Alphonsa Overall, MD;  Location: [facility];  Service: General;  Laterality: Right;  . BREAST SURGERY    . DILATION AND CURETTAGE OF UTERUS    . FACIAL COSMETIC SURGERY     right cheek  . LAPAROSCOPY Right 11/11/2013   Procedure: LAPAROSCOPY OPERATIVE with  Drainage of  RIGHT Ovarian ENDOMETRIOMA;  Surgeon: Elveria Royals, MD;  Location: [facility];  Service: Gynecology;  Laterality: Right;  . NASAL ENDOSCOPY     said it showed some acid reflux from ENT  . NOSE SURGERY     rhinoplasty at age 25 yrs  . WISDOM TOOTH EXTRACTION     Family History:  Family History  Problem Relation Age of Onset  . Diabetes Mother   . Hypertension Mother   . Stroke Mother 70       CVA  . Mental illness Mother        no diagnosis; personality disorder  . Hyperlipidemia Father   . Hypertension Father   . COPD Father   . Alpha-1 antitrypsin deficiency Father   . Asthma Father   . Alpha-1 antitrypsin deficiency Brother   . Asthma Brother   . Diabetes Maternal Grandmother   . Heart disease Maternal Grandmother   . Hyperlipidemia Maternal Grandmother   . Hypertension Maternal Grandmother   . Mental illness Maternal Grandmother   . Heart disease Maternal Grandfather   . Hyperlipidemia Maternal Grandfather   . Hypertension Maternal Grandfather   . Asthma Maternal Grandfather   . Heart disease Paternal Grandfather   . Hyperlipidemia Paternal Grandfather   . Hypertension Paternal Grandfather   . Stroke Paternal Grandfather   . Alzheimer's disease Paternal Grandmother   . Allergic rhinitis Neg Hx   . Angioedema Neg Hx   . Eczema Neg Hx   . Urticaria Neg Hx   . Immunodeficiency Neg Hx   . Colon cancer Neg Hx   . Esophageal cancer Neg Hx    Family Psychiatric History: See above Social History:  Social History   Substance and Sexual Activity  Alcohol Use Not Currently     Social History   Substance and Sexual Activity  Drug Use Not Currently  . Types: "Crack"  cocaine   Comment: 07/19/2019    Social History   Socioeconomic History  . Marital status: Divorced    Spouse name: Not on file  . Number of children: 0  . Years of education: Not on file  . Highest education level: Bachelor's degree (e.g., BA, AB, BS)  Occupational History  . Occupation: disability    Comment: mental illness  Tobacco Use  . Smoking status: Former Smoker    Packs/day: 2.00    Years: 29.00    Pack years: 58.00    Types: Cigarettes    Quit date: 10/08/2019    Years since quitting: 0.1  . Smokeless tobacco: Never Used  Vaping  Use  . Vaping Use: Former  Substance and Sexual Activity  . Alcohol use: Not Currently  . Drug use: Not Currently    Types: "Crack" cocaine    Comment: 07/19/2019  . Sexual activity: Not Currently    Comment: abortion at 68 and 38yrs. of age   Other Topics Concern  . Not on file  Social History Narrative   Marital status: divorced; not dating      Children: none      Lives: alone with mom      Employment: disability for mental illness in 1997.  Hospitalizations x 9 in past.      Tobacco: 1 ppd x since age 41. Decreasing in 2018.      Alcohol: socially; beers.      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      Exercise: yoga daily; exercise daily.      ADLs: independent with ADLs; no car; depends on others for transportation.      Patient is right-handed. She is divorced and lives with her mother. She drinks 2-3 cups of 1/2 caffeine coffe a day. She walks occasionally for exercise.   Social Determinants of Health   Financial Resource Strain:   . Difficulty of Paying Living Expenses: Not on file  Food Insecurity:   . Worried About Charity fundraiser in the Last Year: Not on file  . Ran Out of Food in the Last Year: Not on file  Transportation Needs:   . Lack of Transportation (Medical): Not on file  . Lack of Transportation (Non-Medical): Not on file  Physical Activity:   . Days of Exercise per Week: Not on  file  . Minutes of Exercise per Session: Not on file  Stress:   . Feeling of Stress : Not on file  Social Connections:   . Frequency of Communication with Friends and Family: Not on file  . Frequency of Social Gatherings with Friends and Family: Not on file  . Attends Religious Services: Not on file  . Active Member of Clubs or Organizations: Not on file  . Attends Archivist Meetings: Not on file  . Marital Status: Not on file   SDOH:  SDOH Screenings   Alcohol Screen:   . Last Alcohol Screening Score (AUDIT): Not on file  Depression (PHQ2-9): Medium Risk  . PHQ-2 Score: 22  Financial Resource Strain:   . Difficulty of Paying Living Expenses: Not on file  Food Insecurity:   . Worried About Charity fundraiser in the Last Year: Not on file  . Ran Out of Food in the Last Year: Not on file  Housing:   . Last Housing Risk Score: Not on file  Physical Activity:   . Days of Exercise per Week: Not on file  . Minutes of Exercise per Session: Not on file  Social Connections:   . Frequency of Communication with Friends and Family: Not on file  . Frequency of Social Gatherings with Friends and Family: Not on file  . Attends Religious Services: Not on file  . Active Member of Clubs or Organizations: Not on file  . Attends Archivist Meetings: Not on file  . Marital Status: Not on file  Stress:   . Feeling of Stress : Not on file  Tobacco Use: Medium Risk  . Smoking Tobacco Use: Former Smoker  . Smokeless Tobacco Use: Never Used  Transportation Needs:   . Film/video editor (Medical): Not  on file  . Lack of Transportation (Non-Medical): Not on file    Has this patient used any form of tobacco in the last 30 days? (Cigarettes, Smokeless Tobacco, Cigars, and/or Pipes) A prescription for an FDA-approved tobacco cessation medication was offered at discharge and the patient refused  Current Medications:  No current facility-administered medications for this  encounter.   Current Outpatient Medications  Medication Sig Dispense Refill  . albuterol (VENTOLIN HFA) 108 (90 Base) MCG/ACT inhaler Inhale 2 puffs into the lungs every 6 (six) hours as needed for wheezing or shortness of breath. 8 g 12  . ascorbic acid (VITAMIN C) 500 MG tablet Take 500 mg by mouth 3 (three) times daily.    . calcium-vitamin D (OSCAL WITH D) 500-200 MG-UNIT tablet Take 1 tablet by mouth 3 (three) times daily.    Marland Kitchen EPINEPHrine 0.3 mg/0.3 mL IJ SOAJ injection Inject 0.3 mLs into the skin once.  2  . gabapentin (NEURONTIN) 100 MG capsule Take 1 capsule (100 mg total) by mouth 2 (two) times daily at 8am and 2pm. 60 capsule 0  . gabapentin (NEURONTIN) 600 MG tablet Take 1 tablet (600 mg total) by mouth at bedtime. 30 tablet 0  . ibuprofen (ADVIL) 800 MG tablet Take 800 mg by mouth 3 (three) times daily.    Marland Kitchen MAGNESIUM-OXIDE 400 (241.3 Mg) MG tablet Take 1 tablet by mouth daily.    . Melatonin 10 MG TABS Take 10 mg by mouth at bedtime.    Marland Kitchen QUEtiapine (SEROQUEL) 200 MG tablet Take 1 tablet (200 mg total) by mouth at bedtime. 30 tablet 0  . QUEtiapine (SEROQUEL) 25 MG tablet Take 1 tablet (25 mg total) by mouth 2 (two) times daily. 60 tablet 0    PTA Medications: (Not in a hospital admission)   Musculoskeletal  Strength & Muscle Tone: within normal limits Gait & Station: normal Patient leans: N/A  Psychiatric Specialty Exam  Presentation  General Appearance: Appropriate for Environment;Casual  Eye Contact:Good  Speech:Clear and Coherent;Normal Rate  Speech Volume:Normal  Handedness:Right   Mood and Affect  Mood:Euthymic  Affect:Congruent;Appropriate   Thought Process  Thought Processes:Coherent  Descriptions of Associations:Intact  Orientation:Full (Time, Place and Person)  Thought Content:WDL  Hallucinations:Hallucinations: None  Ideas of Reference:None  Suicidal Thoughts:Suicidal Thoughts: No SI Active Intent and/or Plan: Without Intent;With  Plan;With Means to Carry Out;With Access to Means SI Passive Intent and/or Plan: With Plan;With Means to Carry Out;With Access to Means  Homicidal Thoughts:Homicidal Thoughts: No   Sensorium  Memory:Immediate Good;Recent Good;Remote Good  Judgment:Fair  Insight:Fair   Executive Functions  Concentration:Fair  Attention Span:Good  [place] of Knowledge:Good  Language:Good   Psychomotor Activity  Psychomotor Activity:Psychomotor Activity: Normal   Assets  Assets:Communication Skills;Desire for Improvement;Financial Resources/Insurance;Housing;Social Support   Sleep  Sleep:Sleep: Fair   Physical Exam  Physical Exam Vitals and nursing note reviewed.  Constitutional:      Appearance: She is well-developed.  Cardiovascular:     Rate and Rhythm: Normal rate.  Pulmonary:     Effort: Pulmonary effort is normal.  Musculoskeletal:        General: Normal range of motion.  Skin:    General: Skin is warm.  Neurological:     Mental Status: She is alert and oriented to person, place, and time.    Review of Systems  Constitutional: Negative.   HENT: Negative.   Eyes: Negative.   Respiratory: Negative.   Cardiovascular: Negative.   Gastrointestinal: Negative.   Genitourinary: Negative.  Musculoskeletal: Negative.   Skin: Negative.   Neurological: Negative.   Endo/Heme/Allergies: Negative.   Psychiatric/Behavioral: Negative.    Blood pressure 116/82, pulse 79, temperature 98.9 F (37.2 C), temperature source Oral, resp. rate 18, height 5\' 8"  (1.727 m), weight 137 lb (62.1 kg), last menstrual period 11/21/2014, SpO2 97 %. Body mass index is 20.83 kg/m.  Demographic Factors:  Caucasian and Unemployed  Loss Factors: NA  Historical Factors: NA  Risk Reduction Factors:   Living with another person, especially a relative, Positive social support and Positive therapeutic relationship  Continued Clinical Symptoms:  Previous Psychiatric Diagnoses and  Treatments  Cognitive Features That Contribute To Risk:  None    Suicide Risk:  Mild:  Suicidal ideation of limited frequency, intensity, duration, and specificity.  There are no identifiable plans, no associated intent, mild dysphoria and related symptoms, good self-control (both objective and subjective assessment), few other risk factors, and identifiable protective factors, including available and accessible social support.  Plan Of Care/Follow-up recommendations:  Continue activity as tolerated. Continue diet as recommended by your PCP. Ensure to keep all appointments with outpatient providers.  Disposition: Discharge home with mother with prescriptions, samples, and follow up appointments  Lewis Shock, [place] 12/15/2019, 4:35 PM

## 2019-12-15 NOTE — ED Notes (Signed)
This patient took her IV loose and let it run all over the floor.  When asked why she stated "It doesn't matter"  She is very non-compliant and rude.   Her mother was here and she talked about how bad we were treating her.  She is refusing any help we have offered.
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
## 2019-12-15 NOTE — ED Notes (Signed)
Pt discharged in no acute distress. Denies SI/HI/AVH. Verbalized understanding of all discharge instructions. All pt belongings returned to pt intact. Safety maintained.

## 2019-12-15 NOTE — ED Notes (Signed)
Pt reports she does not want to go back to [facility] d/t the care she received there. Pt concerned about where she can go d/t dietary restrictions = vegan.

## 2019-12-15 NOTE — ED Notes (Signed)
Patient has been asking for food since she arrived.  The PA taking care of her asked that we wait for lab .
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
## 2019-12-15 NOTE — Discharge Instructions (Addendum)
Take medications as prescribed Keep scheduled appointments

## 2019-12-15 NOTE — BHH Counselor (Signed)
Pt has been accepted to [facility] pending medical clearance and negative COVID test. Discussed with Tillie Rung, Patient Access, [facility] TTS and Latricia, Therapist, sports at [employer].     Vertell Novak, [place], [facility], [facility] Triage Specialist [phone]

## 2019-12-15 NOTE — ED Provider Notes (Signed)
Care handoff received from Suella Broad, PA-C at shift change.  Please see previous provider note for full details.  In short 49 year old female presented after intentional overdose of Seroquel.  Symptoms include restless leg.  Previous team consulted poison control.  They advised basic labs as well as activated charcoal and to monitor the patient for 6 hours.  They recommended a 4-hour Tylenol level, coingestion labs and EKG. Physical Exam  BP 110/79 (BP Location: Left Arm)   Pulse 75   Temp (!) 97.5 F (36.4 C) (Oral)   Resp 16   LMP 11/21/2014   SpO2 96%   Physical Exam Constitutional:      General: She is not in acute distress.    Appearance: Normal appearance. She is well-developed. She is not ill-appearing or diaphoretic.  HENT:     Head: Normocephalic and atraumatic.  Eyes:     General: Vision grossly intact. Gaze aligned appropriately.     Pupils: Pupils are equal, round, and reactive to light.  Neck:     Trachea: Trachea and phonation normal.  Pulmonary:     Effort: Pulmonary effort is normal. No respiratory distress.  Abdominal:     General: There is no distension.     Palpations: Abdomen is soft.     Tenderness: There is no abdominal tenderness. There is no guarding or rebound.  Musculoskeletal:        General: Normal range of motion.     Cervical back: Normal range of motion.  Skin:    General: Skin is warm and dry.  Neurological:     Mental Status: She is alert.     GCS: GCS eye subscore is 4. GCS verbal subscore is 5. GCS motor subscore is 6.     Comments: Speech is clear and goal oriented, follows commands Major Cranial nerves without deficit, no facial droop Moves extremities without ataxia, coordination intact  Psychiatric:        Behavior: Behavior is cooperative.     ED Course/Procedures   Clinical Course as of Dec 14 2216  Thu Dec 15, 2019  1429 Call to poison control who recommends 4 hour tylenol, coingestion labs, ekg. Seroquel with CNS depression,  qtc prolongation (over 500 replace K and mag), seizures (rarely), 6 hours observation. Benzos if needed.  Can try charcoal if she will drink it. Monitor for hypotension, tachycardia. UDS may be positive for [facility] which is a false positive.    [LM]  645 49 year old female brought in by EMS with intentional overdose on Seroquel.  On exam, patient has flat affect and is depressed, denies complaints other than feeling like she has restless leg. Case discussed with poison control as documented. Labs ordered as well as activated charcoal.  Plan is to monitor patient in the emergency room and consult psych after medical evaluation and clearance.  Care will be signed out to oncoming provider at change of shift.   [LM]  1516 49 yo female presenting to ED with intentional seroquel overdose.  This occurred today after the patient was discharged home from [facility] for [place].  She reports ongoing SI.  PA provider discussed case with PC who recommended charcoal given patient's reportedly recent ingestion.  She will need labs, ECG, 6 hour observation.  After medical clearance she will need TTS consult.     [MT]  3419 On exam afebrile, HR normal, BP soft but within her normal limits, 99% on room air.  She is mentating well.  No respiratory depression.     [  MT]  1518 Signed out to afternoon EDP providers with plan to f/u labs, medically clear, and TTS consult.   [MT]  1526 Re-evaluation: Patient resting comfortably, NAD. Reports restless legs, otherwise no complaints. She is sitting upright in bed, stable. Blood work drawn, undergoing EKG. Will continue to monitor.   [BM]  3007 Within normal limits, no anemia or leukocytosis.  CBC WITH DIFFERENTIAL [BM]  1544 110  CBG monitoring, ED(!) [BM]  1606 Negative  Acetaminophen level(!) [BM]  6226 Negative  Salicylate level(!) [BM]  1607 Negative; patient does not appear to be in withdrawl  Ethanol [BM]  1607 wnl  Magnesium [BM]  1607 Glucose 106. Otherwise wnl.   Comprehensive metabolic panel(!) [BM]  3335 Negative  Pregnancy, urine [BM]  1625 Cocaine positive  Urine rapid drug screen (hosp performed)(!) [BM]  1715 Discussed with Dr. Zenia Resides who has seen patient. Will consult TTS now. Cleared to eat/drink.   [BM]  2000 Patient reassessed, resting comfortably, NAD, reports she had some food earlier and would like more. Reports she is feeling well, no concerns. Stable   [BM]  2107 Negative  SARS Coronavirus 2 by RT PCR (hospital order, performed in [facility] hospital lab) Nasopharyngeal Nasopharyngeal Swab [BM]  2107 Glucose 119 otherwise wnl  Basic metabolic panel(!) [BM]  4562 Negative after 4hrs  Salicylate level(!) [BM]  2116 Negative after 4hrs  Acetaminophen level(!) [BM]  2216 Repeat EKG reviewed by Dr. Zenia Resides   [BM]    Clinical Course User Index [BM] Deliah Boston, PA-C [LM] Tacy Learn, PA-C [MT] Wyvonnia Dusky, MD    Procedures  MDM  Patient has been monitored for over 8 hours in the ED, remains stable.  Eating and drinking, vital signs within normal limits.  She has no current complaints or concerns.  She was evaluated by behavioral health and meets inpatient criteria.  At this time there does not appear to be any evidence of an acute emergency medical condition and the patient appears stable for psychiatric disposition.  Discussed case and plan with Dr. Zenia Resides who agrees with medical clearance.  Note: Portions of this report may have been transcribed using voice recognition software. Every effort was made to ensure accuracy; however, inadvertent computerized transcription errors may still be present.    Gari Crown 12/15/19 2218    Lacretia Leigh, MD 12/19/19 1131

## 2019-12-15 NOTE — ED Notes (Signed)
Patient continuously asking when she will see a doctor,  Also wants to see a priest "Not a Chaplain",  Also she is concerned about her dietary restrictions as she is a vegan.

## 2019-12-15 NOTE — ED Notes (Signed)
PT BELONGINGS INCLUDES CLOTHING SHOES AND BLACK POCKET BOOK ALL LOCATED IN LOCKER 30

## 2019-12-16 ENCOUNTER — Encounter (HOSPITAL_COMMUNITY): Payer: Self-pay | Admitting: Emergency Medicine

## 2019-12-16 ENCOUNTER — Other Ambulatory Visit: Payer: Self-pay

## 2019-12-16 DIAGNOSIS — F3162 Bipolar disorder, current episode mixed, moderate: Secondary | ICD-10-CM | POA: Diagnosis not present

## 2019-12-16 MED ORDER — GABAPENTIN 300 MG PO CAPS
600.0000 mg | ORAL_CAPSULE | Freq: Once | ORAL | Status: AC
  Administered 2019-12-16: 600 mg via ORAL
  Filled 2019-12-16: qty 2

## 2019-12-16 MED ORDER — IBUPROFEN 800 MG PO TABS
ORAL_TABLET | ORAL | Status: AC
  Administered 2019-12-16: 800 mg via ORAL
  Filled 2019-12-16: qty 1

## 2019-12-16 MED ORDER — MELATONIN 5 MG PO TABS
10.0000 mg | ORAL_TABLET | Freq: Once | ORAL | Status: AC
  Administered 2019-12-16: 10 mg via ORAL
  Filled 2019-12-16: qty 2

## 2019-12-16 MED ORDER — IBUPROFEN 800 MG PO TABS: 800.0000 mg | ORAL_TABLET | Freq: Once | ORAL | Status: AC

## 2019-12-16 MED ORDER — ALUM & MAG HYDROXIDE-SIMETH 200-200-20 MG/5ML PO SUSP: 30.0000 mL | ORAL | Status: DC | PRN

## 2019-12-16 MED ORDER — GABAPENTIN 100 MG PO CAPS
100.0000 mg | ORAL_CAPSULE | ORAL | Status: DC
  Administered 2019-12-16: 100 mg via ORAL
  Filled 2019-12-16: qty 1

## 2019-12-16 MED ORDER — MAGNESIUM HYDROXIDE 400 MG/5ML PO SUSP: 30.0000 mL | Freq: Every day | ORAL | Status: DC | PRN

## 2019-12-16 NOTE — ED Notes (Signed)
Report called. Pt accepted at new facility.

## 2019-12-16 NOTE — ED Notes (Signed)
Pt given 4th cup of decaf coffee and notified that she has reached the limit for the day. Food options offered and all options denied at this time

## 2019-12-16 NOTE — ED Notes (Signed)
Pt a [facility] transfer, presents with SI, pt ingested unknown amt of Seroquel to harm self, stating her mother was her primary stressor.  Pt A&O x 4, anxious at present, requesting home meds.  NP Geni Bers at bedside to speak with pt. Skin search completed, monitoring for safety, no acute distress noted.

## 2019-12-16 NOTE — ED Notes (Signed)
Pt asleep.

## 2019-12-16 NOTE — ED Notes (Signed)
Pt upset with not having vegan options of food. Explained that we have limited offerings, but that we can offer oatmeal; which is vegan. Pt stated she would probally have some later

## 2019-12-16 NOTE — ED Triage Notes (Signed)
Returns to [facility] [place] after ingestion of Seroquel, unknown amt.

## 2019-12-16 NOTE — ED Notes (Signed)
Pt off unit with provider

## 2019-12-16 NOTE — ED Provider Notes (Signed)
Behavioral Health Admission H&P [facility] & OBS)  Date: 12/16/19 Patient Name: Catherine Olsen MRN: [id number] Chief Complaint:  Chief Complaint  Patient presents with  . Suicidal  Diagnoses:  Bipolar 1 disorder, mixed, moderate (HCC) Chronic prescription benzodiazepine use Suicidal ideation Cocaine abuse (HCC)  HPI: Catherine Olsen is a 49 year old female brought in by EMS after an intentional overdose on Seroquel today. The patient was seen at [facility] last night (09.08.21) and remains on OBS overnight. The patient was seen and was discharged. The patient was reassessed last A.M. The patient was reassessed this morning and discussed medications. The patient stated that with medications restarted that she would feel safe "discharging home to her mother. Agreement was made on restarting Seroquel 200 mg p.o. nightly and 25 mg p.o. twice daily to assist with mood stability and anxiety. The patient agreed with also continuing her Neurontin.   The patient states that she took a whole bottle of her Seroquel. The patient is unsure of the dose, says that small brown pills, doubtful how many states more than a few, possibly 30. The patient states that she planned to take all of her medications; however, after taking her Seroquel, she regretted her decision and called 911. She says that she has had various issues with medications in the past--no other complaints or concerns.  PHQ 2-9:    ED from 12/09/2019 in [facility] Office Visit from 12/01/2018 in Primary Care at [facility] Visit from 01/20/2018 in Primary Care at [place] that you would be better off dead, or of hurting yourself in some way Nearly every day Not at all Not at all  PHQ-9 Total Score 22 9 --        ED from 12/15/2019 in [place] DEPT ED from 12/14/2019 in [facility] ED from 12/09/2019 in [place] Error: Question 6 not populated High Risk High Risk       Total Time spent with patient: 30 minutes  Musculoskeletal  Strength & Muscle Tone: within normal limits Gait & Station: normal Patient leans: N/A  Psychiatric Specialty Exam  Presentation General Appearance: Appropriate for Environment  Eye Contact:Good  Speech:Clear and Coherent  Speech Volume:Normal  Handedness:Right   Mood and Affect  Mood:Depressed;Anxious;Irritable  Affect:Congruent;Depressed;Inappropriate;Full Range   Thought Process  Thought Processes:Coherent  Descriptions of Associations:Intact  Orientation:Full (Time, Place and Person)  Thought Content:Logical;Rumination;Tangential  Hallucinations:Hallucinations: None  Ideas of Reference:None  Suicidal Thoughts:Suicidal Thoughts: No  Homicidal Thoughts:Homicidal Thoughts: No   Sensorium  Memory:Remote Good;Recent Good;Immediate Good  Judgment:Poor  Insight:Lacking   Executive Functions  Concentration:Fair  Attention Span:Fair  [place]  Language:Good   Psychomotor Activity  Psychomotor Activity:Psychomotor Activity: Normal   Assets  Assets:Communication Skills;Housing   Sleep  Sleep:Sleep: Fair Number of Hours of Sleep: -3   Physical Exam ROS  Pulse 90, temperature (!) 96 F (35.6 C), resp. rate 18, height 5\' 8"  (1.727 m), weight 62.1 kg, last menstrual period 11/21/2014, SpO2 98 %. Body mass index is 20.83 kg/m.  Past Psychiatric History:   Is the patient at risk to self? Yes  Has the patient been a risk to self in the past 6 months? Yes .    Has the patient been a risk to self within the distant past? Yes   Is the patient a risk to others? No   Has the patient been a risk  to others in the past 6 months? No   Has the patient been a risk to others within the distant past? No   Past Medical History:  Past Medical History:  Diagnosis Date  . Abnormal uterine bleeding  (AUB) 06/25/2009   Qualifier: Diagnosis of  By: Cathren Laine MD, Ankit    . Allergy   . Anxiety   . Arthritis    hands, lower back, knees  . Asthma   . Bipolar 1 disorder ([place])   . Breast cancer (HCC)    stage 0  . COPD (chronic obstructive pulmonary disease) ([place])   . Depression   . Endometriosis   . Fibromyalgia   . GERD (gastroesophageal reflux disease)   . Headache(784.0)    otc med prn  . Migraine   . Pituitary tumor    microadenoma  . PONV (postoperative nausea and vomiting)   . PTSD (post-traumatic stress disorder)   . Scoliosis   . Termination of pregnancy    x 2 at age 31 and 49 yrs old  . Tobacco abuse 03/04/2015    Past Surgical History:  Procedure Laterality Date  . BREAST BIOPSY Right 05/19/2007  . BREAST BIOPSY Right 06/02/2007  . BREAST BIOPSY  06/29/2019  . BREAST LUMPECTOMY Right 07/21/2019  . BREAST LUMPECTOMY WITH RADIOACTIVE SEED LOCALIZATION Right 07/21/2019   Procedure: RIGHT BREAST LUMPECTOMY WITH RADIOACTIVE SEED LOCALIZATION;  Surgeon: Alphonsa Overall, MD;  Location: [place];  Service: General;  Laterality: Right;  . BREAST SURGERY    . DILATION AND CURETTAGE OF UTERUS    . FACIAL COSMETIC SURGERY     right cheek  . LAPAROSCOPY Right 11/11/2013   Procedure: LAPAROSCOPY OPERATIVE with  Drainage of  RIGHT Ovarian ENDOMETRIOMA;  Surgeon: Elveria Royals, MD;  Location: [facility];  Service: Gynecology;  Laterality: Right;  . NASAL ENDOSCOPY     said it showed some acid reflux from ENT  . NOSE SURGERY     rhinoplasty at age 54 yrs  . WISDOM TOOTH EXTRACTION      Family History:  Family History  Problem Relation Age of Onset  . Diabetes Mother   . Hypertension Mother   . Stroke Mother 24       CVA  . Mental illness Mother        no diagnosis; personality disorder  . Hyperlipidemia Father   . Hypertension Father   . COPD Father   . Alpha-1 antitrypsin deficiency Father   . Asthma Father   . Alpha-1 antitrypsin deficiency Brother   .  Asthma Brother   . Diabetes Maternal Grandmother   . Heart disease Maternal Grandmother   . Hyperlipidemia Maternal Grandmother   . Hypertension Maternal Grandmother   . Mental illness Maternal Grandmother   . Heart disease Maternal Grandfather   . Hyperlipidemia Maternal Grandfather   . Hypertension Maternal Grandfather   . Asthma Maternal Grandfather   . Heart disease Paternal Grandfather   . Hyperlipidemia Paternal Grandfather   . Hypertension Paternal Grandfather   . Stroke Paternal Grandfather   . Alzheimer's disease Paternal Grandmother   . Allergic rhinitis Neg Hx   . Angioedema Neg Hx   . Eczema Neg Hx   . Urticaria Neg Hx   . Immunodeficiency Neg Hx   . Colon cancer Neg Hx   . Esophageal cancer Neg Hx     Social History:  Social History   Socioeconomic History  . Marital status: Divorced    Spouse name: Not  on file  . Number of children: 0  . Years of education: Not on file  . Highest education level: Bachelor's degree (e.g., BA, AB, BS)  Occupational History  . Occupation: disability    Comment: mental illness  Tobacco Use  . Smoking status: Former Smoker    Packs/day: 2.00    Years: 29.00    Pack years: 58.00    Types: Cigarettes    Quit date: 10/08/2019    Years since quitting: 0.1  . Smokeless tobacco: Never Used  Vaping Use  . Vaping Use: Former  Substance and Sexual Activity  . Alcohol use: Not Currently  . Drug use: Not Currently    Types: "Crack" cocaine    Comment: 07/19/2019  . Sexual activity: Not Currently    Comment: abortion at 79 and 81yrs. of age   Other Topics Concern  . Not on file  Social History Narrative   Marital status: divorced; not dating      Children: none      Lives: alone with mom      Employment: disability for mental illness in 1997.  Hospitalizations x 9 in past.      Tobacco: 1 ppd x since age 3. Decreasing in 2018.      Alcohol: socially; beers.      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       Exercise: yoga daily; exercise daily.      ADLs: independent with ADLs; no car; depends on others for transportation.      Patient is right-handed. She is divorced and lives with her mother. She drinks 2-3 cups of 1/2 caffeine coffe a day. She walks occasionally for exercise.   Social Determinants of Health   Financial Resource Strain:   . Difficulty of Paying Living Expenses: Not on file  Food Insecurity:   . Worried About Charity fundraiser in the Last Year: Not on file  . Ran Out of Food in the Last Year: Not on file  Transportation Needs:   . Lack of Transportation (Medical): Not on file  . Lack of Transportation (Non-Medical): Not on file  Physical Activity:   . Days of Exercise per Week: Not on file  . Minutes of Exercise per Session: Not on file  Stress:   . Feeling of Stress : Not on file  Social Connections:   . Frequency of Communication with Friends and Family: Not on file  . Frequency of Social Gatherings with Friends and Family: Not on file  . Attends Religious Services: Not on file  . Active Member of Clubs or Organizations: Not on file  . Attends Archivist Meetings: Not on file  . Marital Status: Not on file  Intimate Partner Violence:   . Fear of Current or Ex-Partner: Not on file  . Emotionally Abused: Not on file  . Physically Abused: Not on file  . Sexually Abused: Not on file    SDOH:  SDOH Screenings   Alcohol Screen:   . Last Alcohol Screening Score (AUDIT): Not on file  Depression (PHQ2-9): Medium Risk  . PHQ-2 Score: 22  Financial Resource Strain:   . Difficulty of Paying Living Expenses: Not on file  Food Insecurity:   . Worried About Charity fundraiser in the Last Year: Not on file  . Ran Out of Food in the Last Year: Not on file  Housing:   . Last Housing Risk Score: Not on file  Physical Activity:   .  Days of Exercise per Week: Not on file  . Minutes of Exercise per Session: Not on file  Social Connections:   . Frequency  of Communication with Friends and Family: Not on file  . Frequency of Social Gatherings with Friends and Family: Not on file  . Attends Religious Services: Not on file  . Active Member of Clubs or Organizations: Not on file  . Attends Archivist Meetings: Not on file  . Marital Status: Not on file  Stress:   . Feeling of Stress : Not on file  Tobacco Use: Medium Risk  . Smoking Tobacco Use: Former Smoker  . Smokeless Tobacco Use: Never Used  Transportation Needs:   . Film/video editor (Medical): Not on file  . Lack of Transportation (Non-Medical): Not on file    Last Labs:    Allergies: Other, Prednisone, Sulfamethoxazole, Oxycodone, Abilify [aripiprazole], Celebrex [celecoxib], Penicillins, Tylenol [acetaminophen], Percocet [oxycodone-acetaminophen], Sulfa antibiotics, and Sulfites  PTA Medications: (Not in a hospital admission)   Medical Decision Making  The patient is a safety risk to self and currently is requiring psychiatric inpatient admission for stabilization and treatment.   Recommendations  Based on my evaluation the patient does not appear to have an emergency medical condition.  Caroline Sauger, NP 12/16/19  12:30 AM

## 2019-12-16 NOTE — ED Notes (Signed)
Pt awake asking for coffee and when providers will come to see her. Questions answered and coffee provided. A&o x4. Will continue to monitor for safety

## 2019-12-16 NOTE — ED Notes (Signed)
Safe transportation called for transport to [facility]
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
## 2019-12-16 NOTE — ED Notes (Signed)
Pt laying in bed watching tv. Calm and not manic at the moment. Will continue to monitor for safety

## 2019-12-16 NOTE — ED Notes (Signed)
Pt belongings returned. Escorted to [place]. Ambulated per self. Left with safe transportation. Stable at time of d/c

## 2019-12-16 NOTE — NC FL2 (Signed)
Catherine Olsen is reviewing patients records for consideration.  [place], Disposition [phone] (cell)

## 2019-12-16 NOTE — Progress Notes (Signed)
Pt accepted to [facility], [place], [place] [address]    Dr. Eileen Stanford    Call report to [place], NP notified.     Pt is Voluntary.    Pt may be transported asap   Pt scheduled  to arrive at location pending transportation   [place], Disposition [phone] (cell)

## 2019-12-16 NOTE — ED Notes (Signed)
Patient had snack- potato chips & water

## 2019-12-16 NOTE — ED Notes (Signed)
Pt sleeping at present, no distress noted, monitoring for safety. 

## 2019-12-16 NOTE — Progress Notes (Signed)
Patient meets inpatient criteria per Marvia Pickles, NP.  Referrals faxed to:   [place] Details         [facility] Details       [facility] Details       [facility] Details       [facility] Details       [facility] Details       [facility] Details       [facility] Details       [facility] Details       [facility] Details       [facility] Details       [place] Details      Jola Babinski MSW,LCSWA,LCASA [place], Disposition [phone] (cell)

## 2019-12-16 NOTE — Progress Notes (Signed)
[facility] is reviewing patients records.  Faxed additional information.  [place], Disposition [phone] (cell)

## 2019-12-16 NOTE — Discharge Instructions (Addendum)
Discharge to [facility]

## 2019-12-16 NOTE — ED Notes (Signed)
Pt back on unit. Will continue to monitor for safety

## 2019-12-26 ENCOUNTER — Encounter: Payer: Self-pay | Admitting: Hematology and Oncology

## 2019-12-29 DIAGNOSIS — R87612 Low grade squamous intraepithelial lesion on cytologic smear of cervix (LGSIL): Secondary | ICD-10-CM | POA: Diagnosis not present

## 2019-12-29 DIAGNOSIS — N801 Endometriosis of ovary: Secondary | ICD-10-CM | POA: Diagnosis not present

## 2019-12-29 DIAGNOSIS — Z79890 Hormone replacement therapy (postmenopausal): Secondary | ICD-10-CM | POA: Diagnosis not present

## 2019-12-29 DIAGNOSIS — D0511 Intraductal carcinoma in situ of right breast: Secondary | ICD-10-CM | POA: Diagnosis not present

## 2019-12-29 DIAGNOSIS — M7918 Myalgia, other site: Secondary | ICD-10-CM | POA: Diagnosis not present

## 2019-12-29 DIAGNOSIS — N870 Mild cervical dysplasia: Secondary | ICD-10-CM | POA: Diagnosis not present

## 2019-12-29 DIAGNOSIS — E28319 Asymptomatic premature menopause: Secondary | ICD-10-CM | POA: Diagnosis not present

## 2020-01-03 ENCOUNTER — Ambulatory Visit (HOSPITAL_COMMUNITY): Payer: Medicare Other | Admitting: Licensed Clinical Social Worker

## 2020-01-04 ENCOUNTER — Ambulatory Visit (HOSPITAL_COMMUNITY): Payer: Medicare Other | Admitting: Clinical

## 2020-01-11 ENCOUNTER — Ambulatory Visit (HOSPITAL_COMMUNITY): Payer: Medicare Other | Admitting: Licensed Clinical Social Worker

## 2020-01-18 ENCOUNTER — Ambulatory Visit (HOSPITAL_COMMUNITY): Payer: Medicare Other | Admitting: Licensed Clinical Social Worker

## 2020-01-18 DIAGNOSIS — F3164 Bipolar disorder, current episode mixed, severe, with psychotic features: Secondary | ICD-10-CM | POA: Diagnosis not present

## 2020-01-21 ENCOUNTER — Encounter (HOSPITAL_COMMUNITY): Payer: Self-pay | Admitting: Emergency Medicine

## 2020-01-21 ENCOUNTER — Other Ambulatory Visit: Payer: Self-pay

## 2020-01-21 ENCOUNTER — Ambulatory Visit (HOSPITAL_COMMUNITY)
Admission: EM | Admit: 2020-01-21 | Discharge: 2020-01-22 | Disposition: A | Discharge status: Home / Self Care | Payer: Medicare Other | Attending: Psychiatry | Admitting: Psychiatry

## 2020-01-21 DIAGNOSIS — Z880 Allergy status to penicillin: Secondary | ICD-10-CM | POA: Diagnosis not present

## 2020-01-21 DIAGNOSIS — Z888 Allergy status to other drugs, medicaments and biological substances status: Secondary | ICD-10-CM | POA: Insufficient documentation

## 2020-01-21 DIAGNOSIS — F1410 Cocaine abuse, uncomplicated: Secondary | ICD-10-CM | POA: Insufficient documentation

## 2020-01-21 DIAGNOSIS — Z882 Allergy status to sulfonamides status: Secondary | ICD-10-CM | POA: Diagnosis not present

## 2020-01-21 DIAGNOSIS — Z20822 Contact with and (suspected) exposure to covid-19: Secondary | ICD-10-CM | POA: Insufficient documentation

## 2020-01-21 DIAGNOSIS — Z79899 Other long term (current) drug therapy: Secondary | ICD-10-CM | POA: Diagnosis not present

## 2020-01-21 DIAGNOSIS — Z886 Allergy status to analgesic agent status: Secondary | ICD-10-CM | POA: Insufficient documentation

## 2020-01-21 DIAGNOSIS — Z87891 Personal history of nicotine dependence: Secondary | ICD-10-CM | POA: Insufficient documentation

## 2020-01-21 DIAGNOSIS — R45851 Suicidal ideations: Secondary | ICD-10-CM | POA: Diagnosis present

## 2020-01-21 DIAGNOSIS — J449 Chronic obstructive pulmonary disease, unspecified: Secondary | ICD-10-CM | POA: Diagnosis not present

## 2020-01-21 DIAGNOSIS — F4310 Post-traumatic stress disorder, unspecified: Secondary | ICD-10-CM | POA: Insufficient documentation

## 2020-01-21 DIAGNOSIS — F419 Anxiety disorder, unspecified: Secondary | ICD-10-CM | POA: Insufficient documentation

## 2020-01-21 DIAGNOSIS — F331 Major depressive disorder, recurrent, moderate: Secondary | ICD-10-CM | POA: Diagnosis not present

## 2020-01-21 DIAGNOSIS — Z885 Allergy status to narcotic agent status: Secondary | ICD-10-CM | POA: Diagnosis not present

## 2020-01-21 LAB — POCT URINE DRUG SCREEN - MANUAL ENTRY (I-SCREEN)
POC Amphetamine UR: NOT DETECTED
POC Buprenorphine (BUP): NOT DETECTED
POC Cocaine UR: POSITIVE — AB
POC Marijuana UR: NOT DETECTED
POC Methadone UR: NOT DETECTED
POC Methamphetamine UR: NOT DETECTED
POC Morphine: NOT DETECTED
POC Oxazepam (BZO): NOT DETECTED
POC Oxycodone UR: NOT DETECTED
POC Secobarbital (BAR): NOT DETECTED

## 2020-01-21 LAB — POC SARS CORONAVIRUS 2 AG -  ED: SARS Coronavirus 2 Ag: NEGATIVE

## 2020-01-21 MED ORDER — IBUPROFEN 800 MG PO TABS
800.0000 mg | ORAL_TABLET | Freq: Three times a day (TID) | ORAL | Status: DC
  Administered 2020-01-22 (×2): 800 mg via ORAL
  Filled 2020-01-21 (×2): qty 1

## 2020-01-21 MED ORDER — ALBUTEROL SULFATE HFA 108 (90 BASE) MCG/ACT IN AERS
2.0000 | INHALATION_SPRAY | Freq: Four times a day (QID) | RESPIRATORY_TRACT | Status: DC | PRN
  Filled 2020-01-21: qty 6.7

## 2020-01-21 MED ORDER — MELATONIN 3 MG PO TABS
9.0000 mg | ORAL_TABLET | Freq: Every day | ORAL | Status: DC
  Administered 2020-01-21: 9 mg via ORAL
  Filled 2020-01-21: qty 3

## 2020-01-21 MED ORDER — MAGNESIUM OXIDE 400 (241.3 MG) MG PO TABS
800.0000 mg | ORAL_TABLET | Freq: Two times a day (BID) | ORAL | Status: DC
  Filled 2020-01-21: qty 2

## 2020-01-21 MED ORDER — ASCORBIC ACID 500 MG PO TABS
500.0000 mg | ORAL_TABLET | Freq: Three times a day (TID) | ORAL | Status: DC
  Administered 2020-01-21 – 2020-01-22 (×2): 500 mg via ORAL
  Filled 2020-01-21 (×2): qty 1

## 2020-01-21 MED ORDER — MAGNESIUM HYDROXIDE 400 MG/5ML PO SUSP: 30.0000 mL | Freq: Every day | ORAL | Status: DC | PRN

## 2020-01-21 MED ORDER — ALUM & MAG HYDROXIDE-SIMETH 200-200-20 MG/5ML PO SUSP: 30.0000 mL | ORAL | Status: DC | PRN

## 2020-01-21 MED ORDER — DIPHENHYDRAMINE HCL 25 MG PO CAPS
25.0000 mg | ORAL_CAPSULE | Freq: Every evening | ORAL | Status: DC | PRN
  Administered 2020-01-21: 25 mg via ORAL
  Filled 2020-01-21: qty 1

## 2020-01-21 MED ORDER — CALCIUM CARBONATE-VITAMIN D 500-200 MG-UNIT PO TABS
1.0000 | ORAL_TABLET | Freq: Three times a day (TID) | ORAL | Status: DC
  Administered 2020-01-21 – 2020-01-22 (×2): 1 via ORAL
  Filled 2020-01-21 (×2): qty 1

## 2020-01-21 MED ORDER — GABAPENTIN 100 MG PO CAPS
100.0000 mg | ORAL_CAPSULE | ORAL | Status: DC
  Administered 2020-01-22: 100 mg via ORAL
  Filled 2020-01-21: qty 1

## 2020-01-21 MED ORDER — GABAPENTIN 600 MG PO TABS: 600.0000 mg | ORAL_TABLET | Freq: Every day | ORAL | Status: DC

## 2020-01-21 NOTE — ED Notes (Signed)
Pt A&O x 4, presents with suicidal ideations, plan to slit her wrist.  Denies HI or AVH.  Pt calm & cooperative at present.  Skin search completed, monitoring for safety.  No distress noted.

## 2020-01-21 NOTE — ED Triage Notes (Signed)
Presents with suicidal ideation, plan to slit her wrist.

## 2020-01-21 NOTE — ED Provider Notes (Signed)
Behavioral Health Admission H&P [facility] & OBS)  Date: 01/21/20 Patient Name: Catherine Olsen MRN: [id number] Chief Complaint:  Chief Complaint  Patient presents with  . Suicidal   Chief Complaint/Presenting Problem: Patient presents to the [facility] with depression, anxiety and suicidal thoughts with a plan to get in bath tub and slit wrist "the right way." Patient states that she was recently at [facility] last month and prescribed Gabapentin, Depakote and Seroquel, and feels the dosage of her Depokote needs to be adjusted. Pt wants inpatient treatment.  Diagnoses:  Bipolar 1 disorder, mixed, moderate (HCC) Chronic prescription benzodiazepine use Suicidal ideation Cocaine abuse (HCC)  HPI: Catherine Olsen is a 49 year old female who presents voluntarily and unaccompanied to [facility]. The patient reported that she was at [facility] in [place], [place], last month for about five days and prescribed Depakote, Gabapentin, and Seroquel. The patient voiced; her Depakote is not at her therapeutic level. She discussed she is experiencing lots of anxiety, PTSD has increased, she's become suicidal and self-medicated. The patient expressed, if she's discharged, she'll go home, get in the bathtub and slit her wrist "the right way." The patient does have outpatient services.   The patient does not appear to be responding to internal or external stimuli. Neither is the patient presenting with any delusional thinking. The patient denies auditory or visual hallucinations. The patient admits to suicidal or self-harm ideations but denies homicidal ideations. The patient is not presenting with any psychotic or paranoid behaviors. During an encounter with the patient, she was able to answer questions appropriately.  PHQ 2-9:    ED from 12/09/2019 in [facility] Office Visit from 12/01/2018 in Primary Care at [facility] Visit from 01/20/2018 in Primary Care at [place] that you  would be better off dead, or of hurting yourself in some way Nearly every day Not at all Not at all  PHQ-9 Total Score 22 9 --        ED from 12/15/2019 in [place] DEPT ED from 12/14/2019 in [facility] ED from 12/09/2019 in [place] Error: Question 6 not populated High Risk High Risk       Total Time spent with patient: 30 minutes  Musculoskeletal  Strength & Muscle Tone: within normal limits Gait & Station: normal Patient leans: N/A  Psychiatric Specialty Exam  Presentation General Appearance: Disheveled;Appropriate for Environment  Eye Contact:Good  Speech:Clear and Coherent  Speech Volume:Normal  Handedness:Right   Mood and Affect  Mood:Irritable;Anxious  Affect:Appropriate   Thought Process  Thought Processes:Coherent  Descriptions of Associations:Intact  Orientation:Full (Time, Place and Person)  Thought Content:Logical;Tangential  Hallucinations:Hallucinations: None  Ideas of Reference:None  Suicidal Thoughts:Suicidal Thoughts: Yes, Active SI Active Intent and/or Plan: With Intent;With Plan;With Means to Carry Out SI Passive Intent and/or Plan: With Intent;With Plan;With Means to Carry Out  Homicidal Thoughts:Homicidal Thoughts: No   Sensorium  Memory:Immediate Good;Recent Good;Remote Good  Judgment:Poor  Insight:Lacking   Executive Functions  Concentration:Good  Attention Span:Good  [place] of Knowledge:Good  Language:Good   Psychomotor Activity  Psychomotor Activity:Psychomotor Activity: Normal   Assets  Assets:Communication Skills;Housing;Desire for Improvement   Sleep  Sleep:Sleep: Fair Number of Hours of Sleep: 4   Physical Exam Vitals and nursing note reviewed.  Constitutional:      Appearance: Normal appearance. She is normal weight.  HENT:     Right Ear: Tympanic membrane normal.  Left  Ear: Tympanic membrane normal.     Nose: Nose normal.     Mouth/Throat:     Mouth: Mucous membranes are moist.  Cardiovascular:     Rate and Rhythm: Normal rate.     Pulses: Normal pulses.  Pulmonary:     Effort: Pulmonary effort is normal.  Musculoskeletal:        General: Normal range of motion.     Cervical back: Normal range of motion and neck supple.  Neurological:     Mental Status: She is alert.  Psychiatric:        Attention and Perception: Attention normal.        Mood and Affect: Mood is anxious. Affect is angry.        Speech: Speech normal.        Behavior: Behavior normal. Behavior is cooperative.        Thought Content: Thought content includes suicidal ideation. Thought content includes suicidal plan.        Cognition and Memory: Cognition and memory normal.        Judgment: Judgment is impulsive and inappropriate.    Review of Systems  Psychiatric/Behavioral: Positive for depression, substance abuse and suicidal ideas. The patient is nervous/anxious and has insomnia.   All other systems reviewed and are negative.   Blood pressure (!) 120/91, pulse 80, temperature (!) 97 F (36.1 C), temperature source Tympanic, resp. rate 20, height 5\' 8"  (1.727 m), weight 143 lb 5 oz (65 kg), last menstrual period 11/21/2014, SpO2 100 %. Body mass index is 21.79 kg/m.  Past Psychiatric History:   Is the patient at risk to self? Yes  Has the patient been a risk to self in the past 6 months? Yes .    Has the patient been a risk to self within the distant past? Yes   Is the patient a risk to others? No   Has the patient been a risk to others in the past 6 months? No   Has the patient been a risk to others within the distant past? No   Past Medical History:  Past Medical History:  Diagnosis Date  . Abnormal uterine bleeding (AUB) 06/25/2009   Qualifier: Diagnosis of  By: Cathren Laine MD, Ankit    . Allergy   . Anxiety   . Arthritis    hands, lower back, knees  . Asthma   .  Bipolar 1 disorder ([place])   . Breast cancer (HCC)    stage 0  . COPD (chronic obstructive pulmonary disease) ([place])   . Depression   . Endometriosis   . Fibromyalgia   . GERD (gastroesophageal reflux disease)   . Headache(784.0)    otc med prn  . Migraine   . Pituitary tumor    microadenoma  . PONV (postoperative nausea and vomiting)   . PTSD (post-traumatic stress disorder)   . Scoliosis   . Termination of pregnancy    x 2 at age 49 and 49 yrs old  . Tobacco abuse 03/04/2015    Past Surgical History:  Procedure Laterality Date  . BREAST BIOPSY Right 05/19/2007  . BREAST BIOPSY Right 06/02/2007  . BREAST BIOPSY  06/29/2019  . BREAST LUMPECTOMY Right 07/21/2019  . BREAST LUMPECTOMY WITH RADIOACTIVE SEED LOCALIZATION Right 07/21/2019   Procedure: RIGHT BREAST LUMPECTOMY WITH RADIOACTIVE SEED LOCALIZATION;  Surgeon: Alphonsa Overall, MD;  Location: [place];  Service: General;  Laterality: Right;  . BREAST SURGERY    . DILATION AND CURETTAGE  OF UTERUS    . FACIAL COSMETIC SURGERY     right cheek  . LAPAROSCOPY Right 11/11/2013   Procedure: LAPAROSCOPY OPERATIVE with  Drainage of  RIGHT Ovarian ENDOMETRIOMA;  Surgeon: Elveria Royals, MD;  Location: [facility];  Service: Gynecology;  Laterality: Right;  . NASAL ENDOSCOPY     said it showed some acid reflux from ENT  . NOSE SURGERY     rhinoplasty at age 67 yrs  . WISDOM TOOTH EXTRACTION      Family History:  Family History  Problem Relation Age of Onset  . Diabetes Mother   . Hypertension Mother   . Stroke Mother 106       CVA  . Mental illness Mother        no diagnosis; personality disorder  . Hyperlipidemia Father   . Hypertension Father   . COPD Father   . Alpha-1 antitrypsin deficiency Father   . Asthma Father   . Alpha-1 antitrypsin deficiency Brother   . Asthma Brother   . Diabetes Maternal Grandmother   . Heart disease Maternal Grandmother   . Hyperlipidemia Maternal Grandmother   . Hypertension  Maternal Grandmother   . Mental illness Maternal Grandmother   . Heart disease Maternal Grandfather   . Hyperlipidemia Maternal Grandfather   . Hypertension Maternal Grandfather   . Asthma Maternal Grandfather   . Heart disease Paternal Grandfather   . Hyperlipidemia Paternal Grandfather   . Hypertension Paternal Grandfather   . Stroke Paternal Grandfather   . Alzheimer's disease Paternal Grandmother   . Allergic rhinitis Neg Hx   . Angioedema Neg Hx   . Eczema Neg Hx   . Urticaria Neg Hx   . Immunodeficiency Neg Hx   . Colon cancer Neg Hx   . Esophageal cancer Neg Hx     Social History:  Social History   Socioeconomic History  . Marital status: Divorced    Spouse name: Not on file  . Number of children: 0  . Years of education: Not on file  . Highest education level: Bachelor's degree (e.g., BA, AB, BS)  Occupational History  . Occupation: disability    Comment: mental illness  Tobacco Use  . Smoking status: Former Smoker    Packs/day: 2.00    Years: 29.00    Pack years: 58.00    Types: Cigarettes    Quit date: 10/08/2019    Years since quitting: 0.2  . Smokeless tobacco: Never Used  Vaping Use  . Vaping Use: Former  Substance and Sexual Activity  . Alcohol use: Not Currently  . Drug use: Not Currently    Types: "Crack" cocaine    Comment: 07/19/2019  . Sexual activity: Not Currently    Comment: abortion at 70 and 59yrs. of age   Other Topics Concern  . Not on file  Social History Narrative   Marital status: divorced; not dating      Children: none      Lives: alone with mom      Employment: disability for mental illness in 1997.  Hospitalizations x 9 in past.      Tobacco: 1 ppd x since age 9. Decreasing in 2018.      Alcohol: socially; beers.      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      Exercise: yoga daily; exercise daily.      ADLs: independent with ADLs; no car; depends on others for transportation.  Patient is  right-handed. She is divorced and lives with her mother. She drinks 2-3 cups of 1/2 caffeine coffe a day. She walks occasionally for exercise.   Social Determinants of Health   Financial Resource Strain:   . Difficulty of Paying Living Expenses: Not on file  Food Insecurity:   . Worried About Charity fundraiser in the Last Year: Not on file  . Ran Out of Food in the Last Year: Not on file  Transportation Needs:   . Lack of Transportation (Medical): Not on file  . Lack of Transportation (Non-Medical): Not on file  Physical Activity:   . Days of Exercise per Week: Not on file  . Minutes of Exercise per Session: Not on file  Stress:   . Feeling of Stress : Not on file  Social Connections:   . Frequency of Communication with Friends and Family: Not on file  . Frequency of Social Gatherings with Friends and Family: Not on file  . Attends Religious Services: Not on file  . Active Member of Clubs or Organizations: Not on file  . Attends Archivist Meetings: Not on file  . Marital Status: Not on file  Intimate Partner Violence:   . Fear of Current or Ex-Partner: Not on file  . Emotionally Abused: Not on file  . Physically Abused: Not on file  . Sexually Abused: Not on file    SDOH:  SDOH Screenings   Alcohol Screen:   . Last Alcohol Screening Score (AUDIT): Not on file  Depression (PHQ2-9): Medium Risk  . PHQ-2 Score: 22  Financial Resource Strain:   . Difficulty of Paying Living Expenses: Not on file  Food Insecurity:   . Worried About Charity fundraiser in the Last Year: Not on file  . Ran Out of Food in the Last Year: Not on file  Housing:   . Last Housing Risk Score: Not on file  Physical Activity:   . Days of Exercise per Week: Not on file  . Minutes of Exercise per Session: Not on file  Social Connections:   . Frequency of Communication with Friends and Family: Not on file  . Frequency of Social Gatherings with Friends and Family: Not on file  . Attends  Religious Services: Not on file  . Active Member of Clubs or Organizations: Not on file  . Attends Archivist Meetings: Not on file  . Marital Status: Not on file  Stress:   . Feeling of Stress : Not on file  Tobacco Use: Medium Risk  . Smoking Tobacco Use: Former Smoker  . Smokeless Tobacco Use: Never Used  Transportation Needs:   . Film/video editor (Medical): Not on file  . Lack of Transportation (Non-Medical): Not on file    Last Labs:  Admission on 01/21/2020  Component Date Value Ref Range Status  . POC Amphetamine UR 01/21/2020 None Detected  None Detected Preliminary  . POC Secobarbital (BAR) 01/21/2020 None Detected  None Detected Preliminary  . POC Buprenorphine (BUP) 01/21/2020 None Detected  None Detected Preliminary  . POC Oxazepam (BZO) 01/21/2020 None Detected  None Detected Preliminary  . POC Cocaine UR 01/21/2020 Positive* None Detected Preliminary  . POC Methamphetamine UR 01/21/2020 None Detected  None Detected Preliminary  . POC Morphine 01/21/2020 None Detected  None Detected Preliminary  . POC Oxycodone UR 01/21/2020 None Detected  None Detected Preliminary  . POC Methadone UR 01/21/2020 None Detected  None Detected Preliminary  . POC Marijuana UR  01/21/2020 None Detected  None Detected Preliminary  . SARS Coronavirus 2 Ag 01/21/2020 Negative  Negative Preliminary  Admission on 12/15/2019, Discharged on 12/15/2019  Component Date Value Ref Range Status  . Glucose-Capillary 12/15/2019 110* 70 - 99 mg/dL Final   Glucose reference range applies only to samples taken after fasting for at least 8 hours.  . Sodium 12/15/2019 141  135 - 145 mmol/L Final  . Potassium 12/15/2019 4.0  3.5 - 5.1 mmol/L Final  . Chloride 12/15/2019 108  98 - 111 mmol/L Final  . CO2 12/15/2019 25  22 - 32 mmol/L Final  . Glucose, Bld 12/15/2019 106* 70 - 99 mg/dL Final   Glucose reference range applies only to samples taken after fasting for at least 8 hours.  . BUN  12/15/2019 15  6 - 20 mg/dL Final  . Creatinine, Ser 12/15/2019 0.65  0.44 - 1.00 mg/dL Final  . Calcium 12/15/2019 8.9  8.9 - 10.3 mg/dL Final  . Total Protein 12/15/2019 6.7  6.5 - 8.1 g/dL Final  . Albumin 12/15/2019 4.3  3.5 - 5.0 g/dL Final  . AST 12/15/2019 16  15 - 41 U/L Final  . ALT 12/15/2019 15  0 - 44 U/L Final  . Alkaline Phosphatase 12/15/2019 60  38 - 126 U/L Final  . Total Bilirubin 12/15/2019 0.6  0.3 - 1.2 mg/dL Final  . GFR calc non Af Amer 12/15/2019 >60  >60 mL/min Final  . GFR calc Af Amer 12/15/2019 >60  >60 mL/min Final  . Anion gap 12/15/2019 8  5 - 15 Final   Performed at [facility], [place] [address] [place], [place] [address]  . Salicylate Lvl 09/32/3557 <7.0* 7.0 - 30.0 mg/dL Final   Performed at [facility] [address] [place], [place] [address]  . Acetaminophen (Tylenol), Serum 12/15/2019 <10* 10 - 30 ug/mL Final   Comment: (NOTE) Therapeutic concentrations vary significantly. A range of 10-30 ug/mL  may be an effective concentration for many patients. However, some  are best treated at concentrations outside of this range. Acetaminophen concentrations >150 ug/mL at 4 hours after ingestion  and >50 ug/mL at 12 hours after ingestion are often associated with  toxic reactions.  Performed at [facility], [place] [address], [place], [place] [address]   . Alcohol, Ethyl (B) 12/15/2019 <10  <10 mg/dL Final   Comment: (NOTE) Lowest detectable limit for serum alcohol is 10 mg/dL.  For medical purposes only. Performed at [facility], [place] [address], [place], [place] [address]   . Opiates 12/15/2019 NONE DETECTED  NONE DETECTED Final  . Cocaine 12/15/2019 POSITIVE* NONE DETECTED Final  . Benzodiazepines 12/15/2019 NONE DETECTED  NONE DETECTED Final  . Amphetamines 12/15/2019 NONE DETECTED  NONE DETECTED Final  . Tetrahydrocannabinol 12/15/2019 NONE DETECTED  NONE DETECTED Final   . Barbiturates 12/15/2019 NONE DETECTED  NONE DETECTED Final   Comment: (NOTE) DRUG SCREEN FOR MEDICAL PURPOSES ONLY.  IF CONFIRMATION IS NEEDED FOR ANY PURPOSE, NOTIFY LAB WITHIN 5 DAYS.  LOWEST DETECTABLE LIMITS FOR URINE DRUG SCREEN Drug Class                     Cutoff (ng/mL) Amphetamine and metabolites    1000 Barbiturate and metabolites    200 Benzodiazepine                 283 Tricyclics and metabolites     300 Opiates and metabolites  300 Cocaine and metabolites        300 THC                            50 Performed at [facility] [place], [place] [address] [place], [place] [address]   . WBC 12/15/2019 5.3  4.0 - 10.5 K/uL Final  . RBC 12/15/2019 4.22  3.87 - 5.11 MIL/uL Final  . Hemoglobin 12/15/2019 13.8  12.0 - 15.0 g/dL Final  . HCT 12/15/2019 40.3  36 - 46 % Final  . MCV 12/15/2019 95.5  80.0 - 100.0 fL Final  . MCH 12/15/2019 32.7  26.0 - 34.0 pg Final  . MCHC 12/15/2019 34.2  30.0 - 36.0 g/dL Final  . RDW 12/15/2019 13.9  11.5 - 15.5 % Final  . Platelets 12/15/2019 218  150 - 400 K/uL Final  . nRBC 12/15/2019 0.0  0.0 - 0.2 % Final  . Neutrophils Relative % 12/15/2019 48  % Final  . Neutro Abs 12/15/2019 2.5  1.7 - 7.7 K/uL Final  . Lymphocytes Relative 12/15/2019 35  % Final  . Lymphs Abs 12/15/2019 1.9  0.7 - 4.0 K/uL Final  . Monocytes Relative 12/15/2019 7  % Final  . Monocytes Absolute 12/15/2019 0.4  0.1 - 1.0 K/uL Final  . Eosinophils Relative 12/15/2019 9  % Final  . Eosinophils Absolute 12/15/2019 0.5  0.0 - 0.5 K/uL Final  . Basophils Relative 12/15/2019 1  % Final  . Basophils Absolute 12/15/2019 0.0  0.0 - 0.1 K/uL Final  . Immature Granulocytes 12/15/2019 0  % Final  . Abs Immature Granulocytes 12/15/2019 0.01  0.00 - 0.07 K/uL Final   Performed at [facility], [place] [address], [place], [place] [address]  . Magnesium 12/15/2019 2.0  1.7 - 2.4 mg/dL Final   Performed at [facility]  [address], [place], [place] [address]  . SARS Coronavirus 2 12/15/2019 NEGATIVE  NEGATIVE Final   Comment: (NOTE) SARS-CoV-2 target nucleic acids are NOT DETECTED.  The SARS-CoV-2 RNA is generally detectable in upper and lower respiratory specimens during the acute phase of infection. The lowest concentration of SARS-CoV-2 viral copies this assay can detect is 250 copies / mL. A negative result does not preclude SARS-CoV-2 infection and should not be used as the sole basis for treatment or other patient management decisions.  A negative result may occur with improper specimen collection / handling, submission of specimen other than nasopharyngeal swab, presence of viral mutation(s) within the areas targeted by this assay, and inadequate number of viral copies (<250 copies / mL). A negative result must be combined with clinical observations, patient history, and epidemiological information.  Fact Sheet for Patients:   StrictlyIdeas.no  Fact Sheet for Healthcare Providers: BankingDealers.co.za  This test is not yet approved or                           cleared by the [place] FDA and has been authorized for detection and/or diagnosis of SARS-CoV-2 by FDA under an Emergency Use Authorization (EUA).  This EUA will remain in effect (meaning this test can be used) for the duration of the COVID-19 declaration under Section 564(b)(1) of the Act, 21 U.S.C. section 360bbb-3(b)(1), unless the authorization is terminated or revoked sooner.  Performed at [facility], [place] [address], [place], [place] [address]   . Preg Test, Ur  12/15/2019 NEGATIVE  NEGATIVE Final   Comment:        THE SENSITIVITY OF THIS METHODOLOGY IS >20 mIU/mL. Performed at [facility], [place] [address], [place], [place] [address]   . Sodium 12/15/2019 144  135 - 145 mmol/L Final  . Potassium 12/15/2019 4.1  3.5 - 5.1 mmol/L Final   . Chloride 12/15/2019 109  98 - 111 mmol/L Final  . CO2 12/15/2019 24  22 - 32 mmol/L Final  . Glucose, Bld 12/15/2019 119* 70 - 99 mg/dL Final   Glucose reference range applies only to samples taken after fasting for at least 8 hours.  . BUN 12/15/2019 13  6 - 20 mg/dL Final  . Creatinine, Ser 12/15/2019 0.61  0.44 - 1.00 mg/dL Final  . Calcium 12/15/2019 9.1  8.9 - 10.3 mg/dL Final  . GFR calc non Af Amer 12/15/2019 >60  >60 mL/min Final  . GFR calc Af Amer 12/15/2019 >60  >60 mL/min Final  . Anion gap 12/15/2019 11  5 - 15 Final   Performed at [facility], [place] [address], [place], [place] [address]  . Acetaminophen (Tylenol), Serum 12/15/2019 <10* 10 - 30 ug/mL Final   Comment: (NOTE) Therapeutic concentrations vary significantly. A range of 10-30 ug/mL  may be an effective concentration for many patients. However, some  are best treated at concentrations outside of this range. Acetaminophen concentrations >150 ug/mL at 4 hours after ingestion  and >50 ug/mL at 12 hours after ingestion are often associated with  toxic reactions.  Performed at [facility], [place] [address] [place], [place] [address]   . Salicylate Lvl 07/62/2633 <7.0* 7.0 - 30.0 mg/dL Final   Performed at [facility] [address] [place] [address], [place], [place] [address]  . Alcohol, Ethyl (B) 12/15/2019 <10  <10 mg/dL Final   Comment: (NOTE) Lowest detectable limit for serum alcohol is 10 mg/dL.  For medical purposes only. Performed at [facility], [place] [address], [place], [place] [address]   Admission on 12/14/2019, Discharged on 12/15/2019  Component Date Value Ref Range Status  . SARS Coronavirus 2 12/14/2019 NEGATIVE  NEGATIVE Final   Comment: (NOTE) SARS-CoV-2 target nucleic acids are NOT DETECTED.  The SARS-CoV-2 RNA is generally detectable in upper and lower respiratory specimens during the acute phase of infection. The  lowest concentration of SARS-CoV-2 viral copies this assay can detect is 250 copies / mL. A negative result does not preclude SARS-CoV-2 infection and should not be used as the sole basis for treatment or other patient management decisions.  A negative result may occur with improper specimen collection / handling, submission of specimen other than nasopharyngeal swab, presence of viral mutation(s) within the areas targeted by this assay, and inadequate number of viral copies (<250 copies / mL). A negative result must be combined with clinical observations, patient history, and epidemiological information.  Fact Sheet for Patients:   StrictlyIdeas.no  Fact Sheet for Healthcare Providers: BankingDealers.co.za  This test is not yet approved or                           cleared by the [place] FDA and has been authorized for detection and/or diagnosis of SARS-CoV-2 by FDA under an Emergency Use Authorization (EUA).  This EUA will remain in effect (meaning this test can be used) for the duration of the COVID-19 declaration under Section 564(b)(1) of the Act, 21 U.S.C.  section 360bbb-3(b)(1), unless the authorization is terminated or revoked sooner.  Performed at [facility], [place] [address], [place], [place] [address]   . Sodium 12/14/2019 139  135 - 145 mmol/L Final  . Potassium 12/14/2019 3.6  3.5 - 5.1 mmol/L Final  . Chloride 12/14/2019 104  98 - 111 mmol/L Final  . CO2 12/14/2019 24  22 - 32 mmol/L Final  . Glucose, Bld 12/14/2019 133* 70 - 99 mg/dL Final   Glucose reference range applies only to samples taken after fasting for at least 8 hours.  . BUN 12/14/2019 10  6 - 20 mg/dL Final  . Creatinine, Ser 12/14/2019 0.65  0.44 - 1.00 mg/dL Final  . Calcium 12/14/2019 9.3  8.9 - 10.3 mg/dL Final  . Total Protein 12/14/2019 6.3* 6.5 - 8.1 g/dL Final  . Albumin 12/14/2019 4.0  3.5 - 5.0 g/dL Final  . AST 12/14/2019 15  15 -  41 U/L Final  . ALT 12/14/2019 16  0 - 44 U/L Final  . Alkaline Phosphatase 12/14/2019 57  38 - 126 U/L Final  . Total Bilirubin 12/14/2019 0.1* 0.3 - 1.2 mg/dL Final  . GFR calc non Af Amer 12/14/2019 >60  >60 mL/min Final  . GFR calc Af Amer 12/14/2019 >60  >60 mL/min Final  . Anion gap 12/14/2019 11  5 - 15 Final   Performed at [facility] [address], [place], [place] [address]  . POC Amphetamine UR 12/14/2019 None Detected  None Detected Final  . POC Secobarbital (BAR) 12/14/2019 None Detected  None Detected Final  . POC Buprenorphine (BUP) 12/14/2019 None Detected  None Detected Final  . POC Oxazepam (BZO) 12/14/2019 None Detected  None Detected Final  . POC Cocaine UR 12/14/2019 Positive* None Detected Final  . POC Methamphetamine UR 12/14/2019 None Detected  None Detected Final  . POC Morphine 12/14/2019 None Detected  None Detected Final  . POC Oxycodone UR 12/14/2019 None Detected  None Detected Final  . POC Methadone UR 12/14/2019 None Detected  None Detected Final  . POC Marijuana UR 12/14/2019 None Detected  None Detected Final  . SARS Coronavirus 2 Ag 12/14/2019 Negative  Negative Preliminary  . Glucose, UA 12/14/2019 NEGATIVE  NEGATIVE mg/dL Final  . Bilirubin Urine 12/14/2019 NEGATIVE  NEGATIVE Final  . Ketones, ur 12/14/2019 NEGATIVE  NEGATIVE mg/dL Final  . Specific Gravity, Urine 12/14/2019 1.020  1.005 - 1.030 Final  . Hgb urine dipstick 12/14/2019 NEGATIVE  NEGATIVE Final  . pH 12/14/2019 7.5  5.0 - 8.0 Final  . Protein, ur 12/14/2019 NEGATIVE  NEGATIVE mg/dL Final  . Urobilinogen, UA 12/14/2019 0.2  0.0 - 1.0 mg/dL Final  . Nitrite 12/14/2019 NEGATIVE  NEGATIVE Final  . Chalmers Guest 12/14/2019 NEGATIVE  NEGATIVE Final   Biochemical Testing Only. Please order routine urinalysis from main lab if confirmatory testing is needed.  Admission on 12/09/2019, Discharged on 12/09/2019  Component Date Value Ref Range Status  . SARS Coronavirus 2 12/09/2019  NEGATIVE  NEGATIVE Final   Comment: (NOTE) SARS-CoV-2 target nucleic acids are NOT DETECTED.  The SARS-CoV-2 RNA is generally detectable in upper and lower respiratory specimens during the acute phase of infection. The lowest concentration of SARS-CoV-2 viral copies this assay can detect is 250 copies / mL. A negative result does not preclude SARS-CoV-2 infection and should not be used as the sole basis for treatment or other patient management decisions.  A negative result may occur with improper specimen collection /  handling, submission of specimen other than nasopharyngeal swab, presence of viral mutation(s) within the areas targeted by this assay, and inadequate number of viral copies (<250 copies / mL). A negative result must be combined with clinical observations, patient history, and epidemiological information.  Fact Sheet for Patients:   StrictlyIdeas.no  Fact Sheet for Healthcare Providers: BankingDealers.co.za  This test is not yet approved or                           cleared by the [place] FDA and has been authorized for detection and/or diagnosis of SARS-CoV-2 by FDA under an Emergency Use Authorization (EUA).  This EUA will remain in effect (meaning this test can be used) for the duration of the COVID-19 declaration under Section 564(b)(1) of the Act, 21 U.S.C. section 360bbb-3(b)(1), unless the authorization is terminated or revoked sooner.  Performed at [facility], [place] [address], [place], [place] [address]   . WBC 12/09/2019 5.6  4.0 - 10.5 K/uL Final  . RBC 12/09/2019 4.92  3.87 - 5.11 MIL/uL Final  . Hemoglobin 12/09/2019 15.4* 12.0 - 15.0 g/dL Final  . HCT 12/09/2019 46.0  36 - 46 % Final  . MCV 12/09/2019 93.5  80.0 - 100.0 fL Final  . MCH 12/09/2019 31.3  26.0 - 34.0 pg Final  . MCHC 12/09/2019 33.5  30.0 - 36.0 g/dL Final  . RDW 12/09/2019 13.5  11.5 - 15.5 % Final  . Platelets 12/09/2019  281  150 - 400 K/uL Final  . nRBC 12/09/2019 0.0  0.0 - 0.2 % Final  . Neutrophils Relative % 12/09/2019 60  % Final  . Neutro Abs 12/09/2019 3.3  1.7 - 7.7 K/uL Final  . Lymphocytes Relative 12/09/2019 30  % Final  . Lymphs Abs 12/09/2019 1.7  0.7 - 4.0 K/uL Final  . Monocytes Relative 12/09/2019 5  % Final  . Monocytes Absolute 12/09/2019 0.3  0.1 - 1.0 K/uL Final  . Eosinophils Relative 12/09/2019 5  % Final  . Eosinophils Absolute 12/09/2019 0.3  0.0 - 0.5 K/uL Final  . Basophils Relative 12/09/2019 0  % Final  . Basophils Absolute 12/09/2019 0.0  0.0 - 0.1 K/uL Final  . Immature Granulocytes 12/09/2019 0  % Final  . Abs Immature Granulocytes 12/09/2019 0.01  0.00 - 0.07 K/uL Final   Performed at [facility], [place] [address], [place], [place] [address]  . Sodium 12/09/2019 138  135 - 145 mmol/L Final  . Potassium 12/09/2019 4.0  3.5 - 5.1 mmol/L Final  . Chloride 12/09/2019 99  98 - 111 mmol/L Final  . CO2 12/09/2019 27  22 - 32 mmol/L Final  . Glucose, Bld 12/09/2019 102* 70 - 99 mg/dL Final   Glucose reference range applies only to samples taken after fasting for at least 8 hours.  . BUN 12/09/2019 14  6 - 20 mg/dL Final  . Creatinine, Ser 12/09/2019 0.80  0.44 - 1.00 mg/dL Final  . Calcium 12/09/2019 10.1  8.9 - 10.3 mg/dL Final  . Total Protein 12/09/2019 7.8  6.5 - 8.1 g/dL Final  . Albumin 12/09/2019 4.8  3.5 - 5.0 g/dL Final  . AST 12/09/2019 20  15 - 41 U/L Final  . ALT 12/09/2019 20  0 - 44 U/L Final  . Alkaline Phosphatase 12/09/2019 65  38 - 126 U/L Final  . Total Bilirubin 12/09/2019 0.8  0.3 - 1.2 mg/dL Final  . GFR  calc non Af Amer 12/09/2019 >60  >60 mL/min Final  . GFR calc Af Amer 12/09/2019 >60  >60 mL/min Final  . Anion gap 12/09/2019 12  5 - 15 Final   Performed at [facility], [place] [address], [place], [place] [address]  . Hgb A1c MFr Bld 12/09/2019 5.5  4.8 - 5.6 % Final   Comment: (NOTE) Pre diabetes:          5.7%-6.4%  Diabetes:               >6.4%  Glycemic control for   <7.0% adults with diabetes   . Mean Plasma Glucose 12/09/2019 111.15  mg/dL Final   Performed at [facility] [address], [place], [place] [address]  . Magnesium 12/09/2019 2.0  1.7 - 2.4 mg/dL Final   Performed at [facility] [address] [place], [place] [address]  . Alcohol, Ethyl (B) 12/09/2019 <10  <10 mg/dL Final   Comment: (NOTE) Lowest detectable limit for serum alcohol is 10 mg/dL.  For medical purposes only. Performed at [facility], [place] [address], [place], [place] [address]   . Cholesterol 12/09/2019 210* 0 - 200 mg/dL Final  . Triglycerides 12/09/2019 131  <150 mg/dL Final  . HDL 12/09/2019 69  >40 mg/dL Final  . Total CHOL/HDL Ratio 12/09/2019 3.0  RATIO Final  . VLDL 12/09/2019 26  0 - 40 mg/dL Final  . LDL Cholesterol 12/09/2019 115* 0 - 99 mg/dL Final   Comment:        Total Cholesterol/HDL:CHD Risk Coronary Heart Disease Risk Table                     Men   Women  1/2 Average Risk   3.4   3.3  Average Risk       5.0   4.4  2 X Average Risk   9.6   7.1  3 X Average Risk  23.4   11.0        Use the calculated Patient Ratio above and the CHD Risk Table to determine the patient's CHD Risk.        ATP III CLASSIFICATION (LDL):  <100     mg/dL   Optimal  100-129  mg/dL   Near or Above                    Optimal  130-159  mg/dL   Borderline  160-189  mg/dL   High  >190     mg/dL   Very High Performed at [facility] [address], [place], [place] [address]   . TSH 12/09/2019 0.957  0.350 - 4.500 uIU/mL Final   Comment: Performed by a 3rd Generation assay with a functional sensitivity of <=0.01 uIU/mL. Performed at [facility], [place] [address] [place], [place] [address]   . Prolactin 12/09/2019 7.6  4.8 - 23.3 ng/mL Final   Comment: (NOTE) Performed At: [facility] [place], [place] [id number] Rush Farmer MD [id number]   . POC Amphetamine UR  12/09/2019 None Detected  None Detected Final  . POC Secobarbital (BAR) 12/09/2019 None Detected  None Detected Final  . POC Buprenorphine (BUP) 12/09/2019 None Detected  None Detected Final  . POC Oxazepam (BZO) 12/09/2019 None Detected  None Detected Final  . POC Cocaine UR 12/09/2019 Positive* None Detected Final  . POC Methamphetamine UR 12/09/2019 None Detected  None  Detected Final  . POC Morphine 12/09/2019 None Detected  None Detected Final  . POC Oxycodone UR 12/09/2019 None Detected  None Detected Final  . POC Methadone UR 12/09/2019 None Detected  None Detected Final  . POC Marijuana UR 12/09/2019 None Detected  None Detected Final  . SARS Coronavirus 2 Ag 12/09/2019 Negative  Negative Final  . Preg Test, Ur 12/09/2019 NEGATIVE  NEGATIVE Final   Comment:        THE SENSITIVITY OF THIS METHODOLOGY IS >24 mIU/mL   . SARS Coronavirus 2 Ag 12/09/2019 NEGATIVE  NEGATIVE Final   Comment: (NOTE) SARS-CoV-2 antigen NOT DETECTED.   Negative results are presumptive.  Negative results do not preclude SARS-CoV-2 infection and should not be used as the sole basis for treatment or other patient management decisions, including infection  control decisions, particularly in the presence of clinical signs and  symptoms consistent with COVID-19, or in those who have been in contact with the virus.  Negative results must be combined with clinical observations, patient history, and epidemiological information. The expected result is Negative.  Fact Sheet for Patients: PodPark.tn  Fact Sheet for Healthcare Providers: GiftContent.is   This test is not yet approved or cleared by the [place] FDA and  has been authorized for detection and/or diagnosis of SARS-CoV-2 by FDA under an Emergency Use Authorization (EUA).  This EUA will remain in effect (meaning this test can be used) for the duration of  the C                           OVID-19 declaration under Section 564(b)(1) of the Act, 21 U.S.C. section 360bbb-3(b)(1), unless the authorization is terminated or revoked sooner.    Appointment on 12/05/2019  Component Date Value Ref Range Status  . Estradiol, Sensitive 12/05/2019 <2.5  pg/mL Final   Comment: (NOTE)           Female:            Follicular:                    30.0 - 100.0            Luteal:                        70.0 - 300.0            Postmenopausal:                      < 15.0 This test was developed and its performance characteristics determined by [facility]. It has not been cleared by the Food and Drug Administration. Methodology: Liquid chromatography tandem mass spectrometry(LC/MS/MS) Performed At: [facility] [place], [place] [id number] Rush Farmer MD [id number]   Office Visit on 10/27/2019  Component Date Value Ref Range Status  . High risk HPV 10/27/2019 Negative   Final  . Adequacy 10/27/2019 Satisfactory for evaluation; transformation zone component ABSENT.   Final  . 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 Comment 10/27/2019 Normal Reference Range HPV - Negative   Final  Admission on 10/14/2019, Discharged on 10/14/2019  Component Date Value Ref Range Status  . Sodium 10/14/2019 142  135 - 145 mmol/L Final  . Potassium 10/14/2019 4.4  3.5 - 5.1 mmol/L Final  . Chloride 10/14/2019 108  98 - 111 mmol/L Final  . CO2 10/14/2019  25  22 - 32 mmol/L Final  . Glucose, Bld 10/14/2019 89  70 - 99 mg/dL Final   Glucose reference range applies only to samples taken after fasting for at least 8 hours.  . BUN 10/14/2019 13  6 - 20 mg/dL Final  . Creatinine, Ser 10/14/2019 0.63  0.44 - 1.00 mg/dL Final  . Calcium 10/14/2019 9.2  8.9 - 10.3 mg/dL Final  . Total Protein 10/14/2019 6.5  6.5 - 8.1 g/dL Final  . Albumin 10/14/2019 4.1  3.5 - 5.0 g/dL Final  . AST 10/14/2019 16  15 - 41 U/L Final  . ALT 10/14/2019 18  0 - 44  U/L Final  . Alkaline Phosphatase 10/14/2019 53  38 - 126 U/L Final  . Total Bilirubin 10/14/2019 0.6  0.3 - 1.2 mg/dL Final  . GFR calc non Af Amer 10/14/2019 >60  >60 mL/min Final  . GFR calc Af Amer 10/14/2019 >60  >60 mL/min Final  . Anion gap 10/14/2019 9  5 - 15 Final   Performed at [facility], [place] [address] [place], [place] [address]  . WBC 10/14/2019 6.1  4.0 - 10.5 K/uL Final  . RBC 10/14/2019 4.20  3.87 - 5.11 MIL/uL Final  . Hemoglobin 10/14/2019 13.1  12.0 - 15.0 g/dL Final  . HCT 10/14/2019 39.6  36 - 46 % Final  . MCV 10/14/2019 94.3  80.0 - 100.0 fL Final  . MCH 10/14/2019 31.2  26.0 - 34.0 pg Final  . MCHC 10/14/2019 33.1  30.0 - 36.0 g/dL Final  . RDW 10/14/2019 14.8  11.5 - 15.5 % Final  . Platelets 10/14/2019 244  150 - 400 K/uL Final  . nRBC 10/14/2019 0.0  0.0 - 0.2 % Final  . Neutrophils Relative % 10/14/2019 50  % Final  . Neutro Abs 10/14/2019 3.0  1.7 - 7.7 K/uL Final  . Lymphocytes Relative 10/14/2019 35  % Final  . Lymphs Abs 10/14/2019 2.1  0.7 - 4.0 K/uL Final  . Monocytes Relative 10/14/2019 6  % Final  . Monocytes Absolute 10/14/2019 0.4  0.1 - 1.0 K/uL Final  . Eosinophils Relative 10/14/2019 8  % Final  . Eosinophils Absolute 10/14/2019 0.5  0.0 - 0.5 K/uL Final  . Basophils Relative 10/14/2019 1  % Final  . Basophils Absolute 10/14/2019 0.0  0.0 - 0.1 K/uL Final  . Immature Granulocytes 10/14/2019 0  % Final  . Abs Immature Granulocytes 10/14/2019 0.00  0.00 - 0.07 K/uL Final   Performed at [facility], [place] [address] [place], [place] [address]  . Sed Rate 10/14/2019 5  0 - 22 mm/hr Final   Performed at [facility], [place] [address] [place], [place] [address]  Admission on 10/08/2019, Discharged on 10/08/2019  Component Date Value Ref Range Status  . Neisseria Gonorrhea 10/08/2019 Negative   Final  . Chlamydia 10/08/2019 Negative   Final  . Trichomonas 10/08/2019 Negative   Final  .  Comment 10/08/2019 Normal Reference Range Trichomonas - Negative   Final  . Comment 10/08/2019 Normal Reference [place] Chlamydia - Negative   Final  . Comment 10/08/2019 Normal Reference Range Neisseria Gonorrhea - Negative   Final  . HIV Screen 4th Generation wRfx 10/08/2019 Non Reactive  Non Reactive Final  . RPR Ser Ql 10/08/2019 Non Reactive  Non Reactive Final  Appointment on 08/12/2019  Component Date Value Ref Range Status  . Estradiol, Sensitive 08/12/2019 <2.5  pg/mL Final   Comment: (  NOTE)           Female:            Follicular:                    30.0 - 100.0            Luteal:                        70.0 - 300.0            Postmenopausal:                      < 15.0 This test was developed and its performance characteristics determined by [facility]. It has not been cleared by the Food and Drug Administration. Methodology: Liquid chromatography tandem mass spectrometry(LC/MS/MS) Performed At: [facility] [place], [place] [id number] Rush Farmer MD [id number]     Allergies: Other, Prednisone, Sulfamethoxazole, Oxycodone, Abilify [aripiprazole], Celebrex [celecoxib], Penicillins, Tylenol [acetaminophen], Percocet [oxycodone-acetaminophen], Sulfa antibiotics, and Sulfites  PTA Medications: (Not in a hospital admission)   Medical Decision Making  The patient will remain under observation overnight and reassess in the A.M. to determine if she meets the criteria for psychiatric inpatient admission or can be discharged.  Recommendations  Based on my evaluation the patient does not appear to have an emergency medical condition.  Caroline Sauger, NP 01/21/20  11:40 PM
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
## 2020-01-22 DIAGNOSIS — F331 Major depressive disorder, recurrent, moderate: Secondary | ICD-10-CM | POA: Diagnosis not present

## 2020-01-22 LAB — COMPREHENSIVE METABOLIC PANEL
ALT: 22 U/L (ref 0–44)
AST: 19 U/L (ref 15–41)
Albumin: 4.2 g/dL (ref 3.5–5.0)
Alkaline Phosphatase: 56 U/L (ref 38–126)
Anion gap: 10 (ref 5–15)
BUN: 12 mg/dL (ref 6–20)
CO2: 27 mmol/L (ref 22–32)
Calcium: 9.8 mg/dL (ref 8.9–10.3)
Chloride: 101 mmol/L (ref 98–111)
Creatinine, Ser: 0.71 mg/dL (ref 0.44–1.00)
GFR, Estimated: >60 mL/min (ref >60)
Glucose, Bld: 113 mg/dL — ABNORMAL HIGH (ref 70–99)
Potassium: 4.2 mmol/L (ref 3.5–5.1)
Sodium: 138 mmol/L (ref 135–145)
Total Bilirubin: 0.7 mg/dL (ref 0.3–1.2)
Total Protein: 6.7 g/dL (ref 6.5–8.1)

## 2020-01-22 LAB — RESPIRATORY PANEL BY RT PCR (FLU A&B, COVID)
Influenza A by PCR: NEGATIVE
Influenza B by PCR: NEGATIVE
SARS Coronavirus 2 by RT PCR: NEGATIVE

## 2020-01-22 LAB — CBC WITH DIFFERENTIAL/PLATELET
Abs Immature Granulocytes: 0.01 10*3/uL (ref 0.00–0.07)
Basophils Absolute: 0 10*3/uL (ref 0.0–0.1)
Basophils Relative: 1 %
Eosinophils Absolute: 0.8 10*3/uL — ABNORMAL HIGH (ref 0.0–0.5)
Eosinophils Relative: 13 %
HCT: 42.2 % (ref 36.0–46.0)
Hemoglobin: 14.4 g/dL (ref 12.0–15.0)
Immature Granulocytes: 0 %
Lymphocytes Relative: 37 %
Lymphs Abs: 2.5 10*3/uL (ref 0.7–4.0)
MCH: 32.5 pg (ref 26.0–34.0)
MCHC: 34.1 g/dL (ref 30.0–36.0)
MCV: 95.3 fL (ref 80.0–100.0)
Monocytes Absolute: 0.3 10*3/uL (ref 0.1–1.0)
Monocytes Relative: 5 %
Neutro Abs: 2.9 10*3/uL (ref 1.7–7.7)
Neutrophils Relative %: 44 %
Platelets: 250 10*3/uL (ref 150–400)
RBC: 4.43 MIL/uL (ref 3.87–5.11)
RDW: 13.5 % (ref 11.5–15.5)
WBC: 6.6 10*3/uL (ref 4.0–10.5)
nRBC: 0 % (ref 0.0–0.2)

## 2020-01-22 LAB — LIPID PANEL
Cholesterol: 226 mg/dL — ABNORMAL HIGH (ref 0–200)
HDL: 74 mg/dL (ref >40)
LDL Cholesterol: 131 mg/dL — ABNORMAL HIGH (ref 0–99)
Total CHOL/HDL Ratio: 3.1 RATIO
Triglycerides: 106 mg/dL (ref <150)
VLDL: 21 mg/dL (ref 0–40)

## 2020-01-22 LAB — MAGNESIUM: Magnesium: 1.8 mg/dL (ref 1.7–2.4)

## 2020-01-22 LAB — ETHANOL: Alcohol, Ethyl (B): <10 mg/dL (ref <10)

## 2020-01-22 LAB — VALPROIC ACID LEVEL: Valproic Acid Lvl: 50 ug/mL (ref 50.0–100.0)

## 2020-01-22 LAB — TSH: TSH: 0.828 u[IU]/mL (ref 0.350–4.500)

## 2020-01-22 NOTE — Discharge Instructions (Addendum)
Take all medications as prescribed. Keep all follow-up appointments as scheduled.  Do not consume alcohol or use illegal drugs while on prescription medications. Report any adverse effects from your medications to your primary care provider promptly.  In the event of recurrent symptoms or worsening symptoms, call 911, a crisis hotline, or go to the nearest emergency department for evaluation.   
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
## 2020-01-22 NOTE — ED Notes (Signed)
Patient got agitated because she was not given a particular medicine.

## 2020-01-22 NOTE — ED Notes (Signed)
Pt received 2 cups of oatmeal and multiple cups of decaf coffee

## 2020-01-22 NOTE — ED Notes (Addendum)
Pt alert and oriented on the unit. Education, support, and encouragement provided. Discharge summary, medications and follow up appointments reviewed with pt. Suicide prevention resources provided. Pt's belongings in locker returned and belongings sheet signed. Pt denies SI/HI, A/VH, pain, or any concerns at this time. Pt ambulatory on and off unit. Pt discharged to lobby.

## 2020-01-22 NOTE — ED Notes (Signed)
Pt refused morning vital signs. Pt was angry because the provider did not prescribe her any Seroquel to help her sleep. Pt was loud and began cursing calling the provider a bitch and stated that she wanted to leave this shit hole, Pt awoke other pts that were sleeping. Pt also used the telephone talking loudly on the unit.

## 2020-01-22 NOTE — ED Notes (Signed)
Pt very upset, talking loudly at staff because she cannot have a dose of Seroquel as per  NP Caroline Sauger.  RN explained that no sleep meds are given after 2am.

## 2020-01-23 LAB — PROLACTIN: Prolactin: 6.8 ng/mL (ref 4.8–23.3)

## 2020-01-23 LAB — HEMOGLOBIN A1C
Hgb A1c MFr Bld: 5 % (ref 4.8–5.6)
Mean Plasma Glucose: 97 mg/dL

## 2020-01-23 LAB — POC SARS CORONAVIRUS 2 AG: SARS Coronavirus 2 Ag: NEGATIVE

## 2020-01-25 ENCOUNTER — Ambulatory Visit (HOSPITAL_COMMUNITY): Payer: Self-pay | Admitting: Clinical

## 2020-01-25 ENCOUNTER — Ambulatory Visit (HOSPITAL_COMMUNITY): Payer: Medicare Other | Admitting: Licensed Clinical Social Worker

## 2020-01-26 DIAGNOSIS — Z79899 Other long term (current) drug therapy: Secondary | ICD-10-CM | POA: Diagnosis not present

## 2020-01-29 ENCOUNTER — Encounter: Payer: Self-pay | Admitting: Internal Medicine

## 2020-01-30 ENCOUNTER — Ambulatory Visit: Payer: Medicare Other | Admitting: Family Medicine

## 2020-01-30 ENCOUNTER — Telehealth: Payer: Medicare Other | Admitting: Internal Medicine

## 2020-02-15 ENCOUNTER — Other Ambulatory Visit: Payer: Self-pay

## 2020-02-15 ENCOUNTER — Encounter: Payer: Self-pay | Admitting: Family Medicine

## 2020-02-15 ENCOUNTER — Ambulatory Visit: Payer: Medicare Other | Attending: Family Medicine | Admitting: Family Medicine

## 2020-02-15 VITALS — BP 103/70 | HR 74 | Wt 144.8 lb

## 2020-02-15 DIAGNOSIS — H9203 Otalgia, bilateral: Secondary | ICD-10-CM | POA: Diagnosis not present

## 2020-02-15 DIAGNOSIS — H6123 Impacted cerumen, bilateral: Secondary | ICD-10-CM

## 2020-02-15 DIAGNOSIS — Z87891 Personal history of nicotine dependence: Secondary | ICD-10-CM | POA: Diagnosis not present

## 2020-02-15 DIAGNOSIS — J309 Allergic rhinitis, unspecified: Secondary | ICD-10-CM

## 2020-02-15 DIAGNOSIS — J439 Emphysema, unspecified: Secondary | ICD-10-CM

## 2020-02-15 MED ORDER — EPINEPHRINE 0.3 MG/0.3ML IJ SOAJ: 0.3000 mg | Freq: Once | INTRAMUSCULAR | 2 refills | Status: AC

## 2020-02-15 MED ORDER — OFLOXACIN 0.3 % OT SOLN: 5.0000 [drp] | Freq: Two times a day (BID) | OTIC | 0 refills | Status: AC

## 2020-02-15 MED ORDER — ALBUTEROL SULFATE HFA 108 (90 BASE) MCG/ACT IN AERS: 2.0000 | INHALATION_SPRAY | Freq: Four times a day (QID) | RESPIRATORY_TRACT | 3 refills | Status: DC | PRN

## 2020-02-15 NOTE — Progress Notes (Signed)
Established Patient Office Visit  Subjective:  Patient ID: Catherine Olsen, female    DOB: 1970-08-22  Age: 49 y.o. MRN: [id number]  CC:  Chief Complaint  Patient presents with  . Follow-up    HPI Catherine Olsen, 49 year old female who presents secondary to the complaint of right ear discomfort.  She has not noticed a significant change in hearing.  She denies postnasal drainage or sore throat.  No fever or chills.  She states that she was referred to an ear nose and throat provider as well as referral to dentistry at [facility].  She states that the ENT referral was denied.  She states that she was told that she has issues with chronic inflammation from her prior dental procedures and cheekbone implants.  She also reports that she has had a recent CBC with increase in eosinophil count that she would like to discuss.  Past Medical History:  Diagnosis Date  . Abnormal uterine bleeding (AUB) 06/25/2009   Qualifier: Diagnosis of  By: Cathren Laine MD, Ankit    . Allergy   . Anxiety   . Arthritis    hands, lower back, knees  . Asthma   . Bipolar 1 disorder ([place])   . Breast cancer (HCC)    stage 0  . COPD (chronic obstructive pulmonary disease) ([place])   . Depression   . Endometriosis   . Fibromyalgia   . GERD (gastroesophageal reflux disease)   . Headache(784.0)    otc med prn  . Migraine   . Pituitary tumor    microadenoma  . PONV (postoperative nausea and vomiting)   . PTSD (post-traumatic stress disorder)   . Scoliosis   . Termination of pregnancy    x 2 at age 15 and 49 yrs old  . Tobacco abuse 03/04/2015    Past Surgical History:  Procedure Laterality Date  . BREAST BIOPSY Right 05/19/2007  . BREAST BIOPSY Right 06/02/2007  . BREAST BIOPSY  06/29/2019  . BREAST LUMPECTOMY Right 07/21/2019  . BREAST LUMPECTOMY WITH RADIOACTIVE SEED LOCALIZATION Right 07/21/2019   Procedure: RIGHT BREAST LUMPECTOMY WITH RADIOACTIVE SEED LOCALIZATION;  Surgeon: Alphonsa Overall, MD;  Location: [facility];  Service: General;  Laterality: Right;  . BREAST SURGERY    . DILATION AND CURETTAGE OF UTERUS    . FACIAL COSMETIC SURGERY     right cheek  . LAPAROSCOPY Right 11/11/2013   Procedure: LAPAROSCOPY OPERATIVE with  Drainage of  RIGHT Ovarian ENDOMETRIOMA;  Surgeon: Elveria Royals, MD;  Location: [facility];  Service: Gynecology;  Laterality: Right;  . NASAL ENDOSCOPY     said it showed some acid reflux from ENT  . NOSE SURGERY     rhinoplasty at age 68 yrs  . WISDOM TOOTH EXTRACTION      Family History  Problem Relation Age of Onset  . Diabetes Mother   . Hypertension Mother   . Stroke Mother 56       CVA  . Mental illness Mother        no diagnosis; personality disorder  . Hyperlipidemia Father   . Hypertension Father   . COPD Father   . Alpha-1 antitrypsin deficiency Father   . Asthma Father   . Alpha-1 antitrypsin deficiency Brother   . Asthma Brother   . Diabetes Maternal Grandmother   . Heart disease Maternal Grandmother   . Hyperlipidemia Maternal Grandmother   . Hypertension Maternal Grandmother   . Mental illness Maternal Grandmother   .  Heart disease Maternal Grandfather   . Hyperlipidemia Maternal Grandfather   . Hypertension Maternal Grandfather   . Asthma Maternal Grandfather   . Heart disease Paternal Grandfather   . Hyperlipidemia Paternal Grandfather   . Hypertension Paternal Grandfather   . Stroke Paternal Grandfather   . Alzheimer's disease Paternal Grandmother   . Allergic rhinitis Neg Hx   . Angioedema Neg Hx   . Eczema Neg Hx   . Urticaria Neg Hx   . Immunodeficiency Neg Hx   . Colon cancer Neg Hx   . Esophageal cancer Neg Hx     Social History   Socioeconomic History  . Marital status: Divorced    Spouse name: Not on file  . Number of children: 0  . Years of education: Not on file  . Highest education level: Bachelor's degree (e.g., BA, AB, BS)  Occupational History  . Occupation: disability    Comment: mental illness    Tobacco Use  . Smoking status: Former Smoker    Packs/day: 2.00    Years: 29.00    Pack years: 58.00    Types: Cigarettes    Quit date: 10/08/2019    Years since quitting: 0.3  . Smokeless tobacco: Never Used  Vaping Use  . Vaping Use: Former  Substance and Sexual Activity  . Alcohol use: Not Currently  . Drug use: Not Currently    Types: "Crack" cocaine    Comment: 07/19/2019  . Sexual activity: Not Currently    Comment: abortion at 44 and 75yrs. of age   Other Topics Concern  . Not on file  Social History Narrative   Marital status: divorced; not dating      Children: none      Lives: alone with mom      Employment: disability for mental illness in 1997.  Hospitalizations x 9 in past.      Tobacco: 1 ppd x since age 28. Decreasing in 2018.      Alcohol: socially; beers.      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      Exercise: yoga daily; exercise daily.      ADLs: independent with ADLs; no car; depends on others for transportation.      Patient is right-handed. She is divorced and lives with her mother. She drinks 2-3 cups of 1/2 caffeine coffe a day. She walks occasionally for exercise.   Social Determinants of Health   Financial Resource Strain:   . Difficulty of Paying Living Expenses: Not on file  Food Insecurity:   . Worried About Charity fundraiser in the Last Year: Not on file  . Ran Out of Food in the Last Year: Not on file  Transportation Needs:   . Lack of Transportation (Medical): Not on file  . Lack of Transportation (Non-Medical): Not on file  Physical Activity:   . Days of Exercise per Week: Not on file  . Minutes of Exercise per Session: Not on file  Stress:   . Feeling of Stress : Not on file  Social Connections:   . Frequency of Communication with Friends and Family: Not on file  . Frequency of Social Gatherings with Friends and Family: Not on file  . Attends Religious Services: Not on file  . Active Member of Clubs or  Organizations: Not on file  . Attends Archivist Meetings: Not on file  . Marital Status: Not on file  Intimate Partner Violence:   . Fear  of Current or Ex-Partner: Not on file  . Emotionally Abused: Not on file  . Physically Abused: Not on file  . Sexually Abused: Not on file    Outpatient Medications Prior to Visit  Medication Sig Dispense Refill  . albuterol (VENTOLIN HFA) 108 (90 Base) MCG/ACT inhaler Inhale 2 puffs into the lungs every 6 (six) hours as needed for wheezing or shortness of breath. 8 g 12  . ascorbic acid (VITAMIN C) 500 MG tablet Take 500 mg by mouth 3 (three) times daily.    . calcium-vitamin D (OSCAL WITH D) 500-200 MG-UNIT tablet Take 1 tablet by mouth 3 (three) times daily.    . divalproex (DEPAKOTE ER) 500 MG 24 hr tablet Take 1 tablet by mouth 2 (two) times daily.    Marland Kitchen EPINEPHrine 0.3 mg/0.3 mL IJ SOAJ injection Inject 0.3 mLs into the skin once.  2  . gabapentin (NEURONTIN) 100 MG capsule Take 1 capsule (100 mg total) by mouth 2 (two) times daily at 8am and 2pm. 60 capsule 0  . gabapentin (NEURONTIN) 600 MG tablet Take 1 tablet (600 mg total) by mouth at bedtime. 30 tablet 0  . ibuprofen (ADVIL) 800 MG tablet Take 800 mg by mouth 3 (three) times daily.    Marland Kitchen MAGNESIUM-OXIDE 400 (241.3 Mg) MG tablet Take 1 tablet by mouth in the morning, at noon, and at bedtime.     . Melatonin 10 MG TABS Take 10 mg by mouth at bedtime.    Marland Kitchen QUEtiapine (SEROQUEL) 50 MG tablet Take 50-100 mg by mouth at bedtime.     No facility-administered medications prior to visit.    Allergies  Allergen Reactions  . Other Anaphylaxis    Idiopathic (possible binder or preservatives in medication)    . Prednisone Anaphylaxis    Steroids Steroids ( can take with benadryl )   Can not take higher than 40 mg   . Sulfamethoxazole Anaphylaxis  . Oxycodone Other (See Comments)    Severe blindness?  . Abilify [Aripiprazole]     Generic Abilify--causes severe neurological problems.  Can use name brand  . Celebrex [Celecoxib]   . Penicillins Hives    Has patient had a PCN reaction causing immediate rash, facial/tongue/throat swelling, SOB or lightheadedness with hypotension: no Has patient had a PCN reaction causing severe rash involving mucus membranes or skin necrosis: NO Has patient had a PCN reaction that required hospitalizationNO Has patient had a PCN reaction occurring within the last 10 years:NO If all of the above answers are "NO", then may proceed with Cephalosporin use.   . Tylenol [Acetaminophen]     Stomach pain  . Percocet [Oxycodone-Acetaminophen]     Vision loss  . Sulfa Antibiotics Hives    Pt states she is not really allergic to sulfa, but to sulfites.  . Sulfites Other (See Comments)    unknown    ROS Review of Systems  Constitutional: Positive for fatigue. Negative for chills and fever.  HENT: Positive for ear pain and sinus pressure (Chronic cheekbone pressure). Negative for ear discharge, sore throat and trouble swallowing.   Respiratory: Negative for cough and shortness of breath.   Cardiovascular: Negative for chest pain and palpitations.  Gastrointestinal: Negative for abdominal pain, constipation, diarrhea and nausea.  Endocrine: Negative for polydipsia, polyphagia and polyuria.  Genitourinary: Negative for dysuria and frequency.  Musculoskeletal: Negative for arthralgias and back pain.  Skin: Negative for rash and wound.  Neurological: Negative for dizziness and headaches.  Hematological: Negative for adenopathy.  Does not bruise/bleed easily.      Objective:    Physical Exam Vitals and nursing note reviewed.  Constitutional:      Appearance: Normal appearance.  HENT:     Right Ear: There is impacted cerumen.     Left Ear: There is impacted cerumen.     Ears:     Comments: After removal of bilateral impacted cerumen by ear lavage, patient had some mild erythema of the ear canals as well as central erythema with otherwise  normal appearing eardrums which appeared most likely to be related to ear lavage    Nose: Congestion (Mild edema of the nasal turbinates) and rhinorrhea (Mild clear nasal drainage) present.     Mouth/Throat:     Pharynx: Oropharyngeal exudate and posterior oropharyngeal erythema (Mild posterior pharynx erythema) present.  Cardiovascular:     Rate and Rhythm: Normal rate and regular rhythm.  Pulmonary:     Effort: Pulmonary effort is normal.     Breath sounds: Normal breath sounds.  Abdominal:     Palpations: Abdomen is soft.     Tenderness: There is no abdominal tenderness. There is no guarding or rebound.  Musculoskeletal:     Cervical back: Neck supple. No tenderness.  Lymphadenopathy:     Cervical: No cervical adenopathy.  Skin:    General: Skin is warm and dry.  Neurological:     Mental Status: She is alert and oriented to person, place, and time. Mental status is at baseline.  Psychiatric:        Mood and Affect: Mood normal.        Behavior: Behavior normal.     Comments: Slightly anxious     BP 103/70 (BP Location: Right Arm, Patient Position: Sitting)   Pulse 74   Wt 144 lb 12.8 oz (65.7 kg)   LMP 11/21/2014   SpO2 95%   BMI 22.02 kg/m  Wt Readings from Last 3 Encounters:  02/15/20 144 lb 12.8 oz (65.7 kg)  01/21/20 143 lb 5 oz (65 kg)  12/15/19 137 lb (62.1 kg)     Health Maintenance Due  Topic Date Due  . COVID-19 Vaccine (1) Never done  . INFLUENZA VACCINE  Never done      Lab Results  Component Value Date   TSH 0.828 01/21/2020   Lab Results  Component Value Date   WBC 6.6 01/21/2020   HGB 14.4 01/21/2020   HCT 42.2 01/21/2020   MCV 95.3 01/21/2020   PLT 250 01/21/2020   Lab Results  Component Value Date   NA 138 01/21/2020   K 4.2 01/21/2020   CO2 27 01/21/2020   GLUCOSE 113 (H) 01/21/2020   BUN 12 01/21/2020   CREATININE 0.71 01/21/2020   BILITOT 0.7 01/21/2020   ALKPHOS 56 01/21/2020   AST 19 01/21/2020   ALT 22 01/21/2020   PROT  6.7 01/21/2020   ALBUMIN 4.2 01/21/2020   CALCIUM 9.8 01/21/2020   ANIONGAP 10 01/21/2020   Lab Results  Component Value Date   CHOL 226 (H) 01/21/2020   Lab Results  Component Value Date   HDL 74 01/21/2020   Lab Results  Component Value Date   LDLCALC 131 (H) 01/21/2020   Lab Results  Component Value Date   TRIG 106 01/21/2020   Lab Results  Component Value Date   CHOLHDL 3.1 01/21/2020   Lab Results  Component Value Date   HGBA1C 5.0 01/21/2020      Assessment & Plan:  1. Bilateral  impacted cerumen 2. Otalgia of both ears Patient had complaint of bilateral ear discomfort which was somewhat relieved status post bilateral ear lavage for removal of impacted cerumen.  Patient did have some irritation of the ear canals as well as areas of redness on both TMs which was likely related to ear lavage and not due to an active ear infection.  Prescription provided for Floxin otic to place 5 drops into both ears twice daily x7 days due to her ear canal and eardrum irritation.  Patient's recent CT scans and imaging were accessed through care everywhere as patient asked that I look at her recent imaging.  There was no evidence of any sinus disease/sinusitis or any other acute abnormalities noted. - ofloxacin (FLOXIN OTIC) 0.3 % OTIC solution; Place 5 drops into both ears 2 (two) times daily for 7 days.  Dispense: 5 mL; Refill: 0  3. Allergic rhinitis, unspecified seasonality, unspecified trigger Patient with chronic issues with allergic rhinitis for which she is seen by an allergist.  Discussed with the patient that her eosinophil count of 0.8 on 01/21/2020 with normal range being 0.0-0.5 was most likely related to her allergic rhinitis and asthma and that the mild elevation was not clinically significant.  4. Pulmonary emphysema, unspecified emphysema type ([place]) Patient requests a refill of albuterol at the end of the visit and this was provided.  Patient should continue to follow-up  however with her allergy and asthma specialist. - albuterol (VENTOLIN HFA) 108 (90 Base) MCG/ACT inhaler; Inhale 2 puffs into the lungs every 6 (six) hours as needed for wheezing or shortness of breath.  Dispense: 8 g; Refill: 3     Follow-up: Return for ear pain: 1-2 weeks if not better.    Antony Blackbird, MD

## 2020-02-15 NOTE — Progress Notes (Signed)
WANTS TO DISCUSS CBC-WORRIED ABOUT  EOSINOPHILS COUNT THINKS SHE HAS EAR INFECTION- WANTS RIGHT EAR CLEANED

## 2020-02-16 ENCOUNTER — Ambulatory Visit: Payer: Medicare Other | Admitting: Physical Therapy

## 2020-02-16 ENCOUNTER — Other Ambulatory Visit: Payer: Self-pay | Admitting: *Deleted

## 2020-02-16 DIAGNOSIS — D0511 Intraductal carcinoma in situ of right breast: Secondary | ICD-10-CM

## 2020-02-17 ENCOUNTER — Inpatient Hospital Stay: Payer: Medicare Other | Admitting: Hematology and Oncology

## 2020-02-17 ENCOUNTER — Inpatient Hospital Stay: Payer: Medicare Other

## 2020-02-17 ENCOUNTER — Encounter: Payer: Self-pay | Admitting: Hematology and Oncology

## 2020-02-17 NOTE — Assessment & Plan Note (Deleted)
Right lumpectomy: Dr. 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 Adjuvant radiation therapy: Patient discussed with Dr. Sondra Come and agreed to radiation. 2.Opted against anti estrogen therapy.  ____________________________________________________________________________________________ Maxillofacial CT scan 11/22/2019: Normal  Multiple emergency room visits for symptoms related to depression Substance abuse: Cocaine  Breast cancer surveillance: Annual mammograms and breast exams

## 2020-02-20 ENCOUNTER — Telehealth: Payer: Self-pay | Admitting: Hematology and Oncology

## 2020-02-20 NOTE — Telephone Encounter (Signed)
Called pt per 11/12 sch msg - no answer. Left message for patient to call back to reschedule appt.

## 2020-02-23 ENCOUNTER — Ambulatory Visit: Payer: Medicare Other | Admitting: Physical Therapy

## 2020-02-28 DIAGNOSIS — F3162 Bipolar disorder, current episode mixed, moderate: Secondary | ICD-10-CM | POA: Diagnosis not present

## 2020-02-29 DIAGNOSIS — N6011 Diffuse cystic mastopathy of right breast: Secondary | ICD-10-CM | POA: Diagnosis not present

## 2020-02-29 DIAGNOSIS — D0591 Unspecified type of carcinoma in situ of right breast: Secondary | ICD-10-CM | POA: Diagnosis not present

## 2020-02-29 DIAGNOSIS — N6012 Diffuse cystic mastopathy of left breast: Secondary | ICD-10-CM | POA: Diagnosis not present

## 2020-03-06 ENCOUNTER — Other Ambulatory Visit: Payer: Self-pay | Admitting: Surgery

## 2020-03-06 DIAGNOSIS — D0591 Unspecified type of carcinoma in situ of right breast: Secondary | ICD-10-CM

## 2020-03-07 ENCOUNTER — Ambulatory Visit
Admission: RE | Admit: 2020-03-07 | Discharge: 2020-03-07 | Disposition: A | Discharge status: Home / Self Care | Payer: Medicare Other | Source: Ambulatory Visit | Attending: Surgery | Admitting: Surgery

## 2020-03-07 ENCOUNTER — Other Ambulatory Visit: Payer: Self-pay

## 2020-03-07 DIAGNOSIS — D0591 Unspecified type of carcinoma in situ of right breast: Secondary | ICD-10-CM

## 2020-03-07 DIAGNOSIS — R922 Inconclusive mammogram: Secondary | ICD-10-CM | POA: Diagnosis not present

## 2020-03-07 DIAGNOSIS — N6011 Diffuse cystic mastopathy of right breast: Secondary | ICD-10-CM | POA: Diagnosis not present

## 2020-03-07 DIAGNOSIS — N6012 Diffuse cystic mastopathy of left breast: Secondary | ICD-10-CM | POA: Diagnosis not present

## 2020-03-15 DIAGNOSIS — F3164 Bipolar disorder, current episode mixed, severe, with psychotic features: Secondary | ICD-10-CM | POA: Diagnosis not present

## 2020-03-21 ENCOUNTER — Encounter: Payer: Self-pay | Admitting: Family Medicine

## 2020-03-21 NOTE — Progress Notes (Signed)
Patient Care Team: Antony Blackbird, MD as PCP - General (Family Medicine) Services, [facility] as Referring Physician Christen Butter, MD as Referring Physician (Gynecology) Melida Quitter, MD as Consulting Physician (Otolaryngology) Loretta Plume as Consulting Physician (Neurosurgery)  DIAGNOSIS:    ICD-10-CM   1. Ductal carcinoma in situ (DCIS) of right breast  D05.11     SUMMARY OF ONCOLOGIC HISTORY: Oncology History  Ductal carcinoma in situ (DCIS) of right breast  06/29/2019 Initial Diagnosis   Right breast biopsy: Fibrocystic changes, pseudoangiomatous stromal hyperplasia   07/21/2019 Surgery   Right lumpectomy: Dr. Lucia Gaskins: Intermediate grade DCIS 0.2 cm, 0.1 cm from anterior margin, LCIS, ER 95%, PR 30% Tis NX stage 0   07/21/2019 Cancer Staging   Staging form: Breast, AJCC 8th Edition - Pathologic stage from 07/21/2019: Stage 0 (pTis (DCIS), pN0, cM0, ER+, PR+) - Signed by Gardenia Phlegm, NP on 08/03/2019     CHIEF COMPLIANT: Follow-up of right breast DCIS  INTERVAL HISTORY: Catherine Olsen is a 49 y.o. with above-mentioned history of right breast DCIS who underwent a right lumpectomy, and has declined radiation and antiestrogen therapy.Mammogram on 03/07/20 showed no evidence of malignancy bilaterally. She presents to the clinic todayfor follow-up.   She is here today to discuss the role of valproic acid increase in breast cancer risk.  ALLERGIES:  is allergic to other, prednisone, sulfamethoxazole, oxycodone, abilify [aripiprazole], celebrex [celecoxib], penicillins, tylenol [acetaminophen], percocet [oxycodone-acetaminophen], sulfa antibiotics, and sulfites.  MEDICATIONS:  Current Outpatient Medications  Medication Sig Dispense Refill  . albuterol (VENTOLIN HFA) 108 (90 Base) MCG/ACT inhaler Inhale 2 puffs into the lungs every 6 (six) hours as needed for wheezing or shortness of breath. 8 g 3  . ascorbic acid (VITAMIN C) 500 MG tablet Take 500 mg by mouth  3 (three) times daily.    . calcium-vitamin D (OSCAL WITH D) 500-200 MG-UNIT tablet Take 1 tablet by mouth 3 (three) times daily.    . divalproex (DEPAKOTE ER) 500 MG 24 hr tablet Take 1 tablet by mouth 2 (two) times daily.    Marland Kitchen gabapentin (NEURONTIN) 100 MG capsule Take 1 capsule (100 mg total) by mouth 2 (two) times daily at 8am and 2pm. 60 capsule 0  . gabapentin (NEURONTIN) 600 MG tablet Take 1 tablet (600 mg total) by mouth at bedtime. 30 tablet 0  . ibuprofen (ADVIL) 800 MG tablet Take 800 mg by mouth 3 (three) times daily.    Marland Kitchen MAGNESIUM-OXIDE 400 (241.3 Mg) MG tablet Take 1 tablet by mouth in the morning, at noon, and at bedtime.     . Melatonin 10 MG TABS Take 10 mg by mouth at bedtime.    Marland Kitchen QUEtiapine (SEROQUEL) 50 MG tablet Take 50-100 mg by mouth at bedtime.     No current facility-administered medications for this visit.    PHYSICAL EXAMINATION: ECOG PERFORMANCE STATUS: 1 - Symptomatic but completely ambulatory  Vitals:   03/22/20 1452  BP: 109/69  Pulse: 76  Resp: 17  Temp: 98.4 F (36.9 C)  SpO2: 98%   Filed Weights   03/22/20 1452  Weight: 146 lb 1.6 oz (66.3 kg)       LABORATORY DATA:  I have reviewed the data as listed CMP Latest Ref Rng & Units 01/21/2020 12/15/2019 12/15/2019  Glucose 70 - 99 mg/dL 113(H) 119(H) 106(H)  BUN 6 - 20 mg/dL 12 13 15   Creatinine 0.44 - 1.00 mg/dL 0.71 0.61 0.65  Sodium 135 - 145 mmol/L 138 144 141  Potassium 3.5 - 5.1 mmol/L 4.2 4.1 4.0  Chloride 98 - 111 mmol/L 101 109 108  CO2 22 - 32 mmol/L 27 24 25   Calcium 8.9 - 10.3 mg/dL 9.8 9.1 8.9  Total Protein 6.5 - 8.1 g/dL 6.7 - 6.7  Total Bilirubin 0.3 - 1.2 mg/dL 0.7 - 0.6  Alkaline Phos 38 - 126 U/L 56 - 60  AST 15 - 41 U/L 19 - 16  ALT 0 - 44 U/L 22 - 15    Lab Results  Component Value Date   WBC 6.5 03/22/2020   HGB 14.0 03/22/2020   HCT 40.9 03/22/2020   MCV 96.0 03/22/2020   PLT 240 03/22/2020   NEUTROABS PENDING 03/22/2020    ASSESSMENT & PLAN:  Ductal  carcinoma in situ (DCIS) of right breast Right lumpectomy: Dr. 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 Adjuvant radiation therapy: Patient discussed with Dr. Sondra Come and agreed to radiation. 2.Opted against anti estrogen therapy.  ____________________________________________________________________________________________ Maxillofacial CT scan 11/22/2019: Normal   Estradiol levels were drawn today.:  We had a lengthy discussion about the role of estradiol levels.  I do not believe there is much benefit in obtaining estradiol levels on a frequent basis.  They do not indicate risk of breast cancer.  However she wants to keep monitoring it because she is trying different techniques to decrease estradiol levels naturally.   She wanted to discuss the role of valproic acid increasing breast cancer risk.  She provided data from endocrinology General that shows that valproic acid increases the stimulatory effect of estrogens on human endometrial and breast cancer cells.  She would like [place] to discuss with her psychiatrist about this paper and to request a change of her treatment from valproic acid to something else. She suffers from bipolar disorder. Dr. Dorethea Clan is her psychiatrist and she wants [place] to get in touch with her to see if valproic acid can be discontinued and some other medication can be used instead.  Mammogram 10/26/2019: Benign breast density category D Return to clinic in 3 months with labs and follow-up     No orders of the defined types were placed in this encounter.  The patient has a good understanding of the overall plan. she agrees with it. she will call with any problems that may develop before the next visit here.  Total time spent: 20 mins including face to face time and time spent for planning, charting and coordination of care  Nicholas Lose, MD 03/22/2020  I, Cloyde Reams Dorshimer, am  acting as scribe for Dr. Nicholas Lose.  I have reviewed the above documentation for accuracy and completeness, and I agree with the above.

## 2020-03-22 ENCOUNTER — Encounter: Payer: Self-pay | Admitting: Hematology and Oncology

## 2020-03-22 ENCOUNTER — Telehealth: Payer: Self-pay | Admitting: Hematology and Oncology

## 2020-03-22 ENCOUNTER — Inpatient Hospital Stay: Payer: Medicare Other | Attending: Hematology and Oncology | Admitting: Hematology and Oncology

## 2020-03-22 ENCOUNTER — Inpatient Hospital Stay: Payer: Medicare Other

## 2020-03-22 ENCOUNTER — Other Ambulatory Visit: Payer: Self-pay

## 2020-03-22 DIAGNOSIS — D0511 Intraductal carcinoma in situ of right breast: Secondary | ICD-10-CM

## 2020-03-22 LAB — CBC WITH DIFFERENTIAL (CANCER CENTER ONLY)
Abs Immature Granulocytes: 0.01 10*3/uL (ref 0.00–0.07)
Basophils Absolute: 0 10*3/uL (ref 0.0–0.1)
Basophils Relative: 1 %
Eosinophils Absolute: 0.8 10*3/uL — ABNORMAL HIGH (ref 0.0–0.5)
Eosinophils Relative: 13 %
HCT: 40.9 % (ref 36.0–46.0)
Hemoglobin: 14 g/dL (ref 12.0–15.0)
Immature Granulocytes: 0 %
Lymphocytes Relative: 37 %
Lymphs Abs: 2.4 10*3/uL (ref 0.7–4.0)
MCH: 32.9 pg (ref 26.0–34.0)
MCHC: 34.2 g/dL (ref 30.0–36.0)
MCV: 96 fL (ref 80.0–100.0)
Monocytes Absolute: 0.3 10*3/uL (ref 0.1–1.0)
Monocytes Relative: 5 %
Neutro Abs: 2.9 10*3/uL (ref 1.7–7.7)
Neutrophils Relative %: 44 %
Platelet Count: 240 10*3/uL (ref 150–400)
RBC: 4.26 MIL/uL (ref 3.87–5.11)
RDW: 13.7 % (ref 11.5–15.5)
WBC Count: 6.5 10*3/uL (ref 4.0–10.5)
nRBC: 0 % (ref 0.0–0.2)

## 2020-03-22 LAB — CMP (CANCER CENTER ONLY)
ALT: 47 U/L — ABNORMAL HIGH (ref 0–44)
AST: 29 U/L (ref 15–41)
Albumin: 3.7 g/dL (ref 3.5–5.0)
Alkaline Phosphatase: 75 U/L (ref 38–126)
Anion gap: 6 (ref 5–15)
BUN: 17 mg/dL (ref 6–20)
CO2: 29 mmol/L (ref 22–32)
Calcium: 8.7 mg/dL — ABNORMAL LOW (ref 8.9–10.3)
Chloride: 108 mmol/L (ref 98–111)
Creatinine: 0.75 mg/dL (ref 0.44–1.00)
GFR, Estimated: >60 mL/min (ref >60)
Glucose, Bld: 77 mg/dL (ref 70–99)
Potassium: 4.3 mmol/L (ref 3.5–5.1)
Sodium: 143 mmol/L (ref 135–145)
Total Bilirubin: 0.2 mg/dL — ABNORMAL LOW (ref 0.3–1.2)
Total Protein: 6.6 g/dL (ref 6.5–8.1)

## 2020-03-22 NOTE — Assessment & Plan Note (Signed)
Right lumpectomy: Dr. 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 Adjuvant radiation therapy: Patient discussed with Dr. Sondra Come and agreed to radiation. 2.Opted against anti estrogen therapy.  ____________________________________________________________________________________________ Maxillofacial CT scan 11/22/2019: Normal   Estradiol levels were drawn today. I answered several questions regarding her prognosis if no antiestrogen therapy is found.  Patient wants to go back to intermittent fasting and thereby reduce her estradiol levels.  Mammogram 10/26/2019: Benign breast density category D Return to clinic in 6 months with labs and follow-up

## 2020-03-22 NOTE — Telephone Encounter (Signed)
Scheduled appts per 12/16 los. Pt stated she would refer to mychart for AVS and appt details.

## 2020-03-23 ENCOUNTER — Encounter: Payer: Self-pay | Admitting: Hematology and Oncology

## 2020-03-26 ENCOUNTER — Ambulatory Visit: Payer: Medicare Other | Admitting: Physical Therapy

## 2020-03-26 DIAGNOSIS — F3164 Bipolar disorder, current episode mixed, severe, with psychotic features: Secondary | ICD-10-CM | POA: Diagnosis not present

## 2020-03-27 LAB — ESTRADIOL, ULTRA SENS: Estradiol, Sensitive: <2.5 pg/mL

## 2020-03-29 DIAGNOSIS — J069 Acute upper respiratory infection, unspecified: Secondary | ICD-10-CM | POA: Diagnosis not present

## 2020-03-29 DIAGNOSIS — Z20822 Contact with and (suspected) exposure to covid-19: Secondary | ICD-10-CM | POA: Diagnosis not present

## 2020-03-29 DIAGNOSIS — R059 Cough, unspecified: Secondary | ICD-10-CM | POA: Diagnosis not present

## 2020-03-29 DIAGNOSIS — J029 Acute pharyngitis, unspecified: Secondary | ICD-10-CM | POA: Diagnosis not present

## 2020-03-31 ENCOUNTER — Encounter: Payer: Self-pay | Admitting: Hematology and Oncology

## 2020-04-04 ENCOUNTER — Ambulatory Visit: Payer: Medicare Other | Admitting: Physical Therapy

## 2020-04-05 NOTE — Telephone Encounter (Signed)
Please call patient and share an in person visit is needed for acne. She may be scheduled with the unit on Tuesday 04/10/20 at [employer] [address]

## 2020-04-10 ENCOUNTER — Telehealth: Payer: Self-pay

## 2020-04-10 NOTE — Telephone Encounter (Signed)
Copied from CRM [id number]. Topic: Appointment Scheduling - Scheduling Inquiry for Clinic >> Apr 09, 2020  4:38 PM Daphine Deutscher D wrote: Pt called saying she use to see Cammie Fulp and she has seen Dr. Alvis Lemmings for a cpe in July but she does not want to see her again.  She would like to schd something for Feb for a FU check up.   She does not know who to see.    CB#  [phone]   Please see patient MyChart message from both 12/15 and 12/30. Unsure of how to further assist this patient. Please advise/follow up.

## 2020-04-10 NOTE — Telephone Encounter (Signed)
Please schedule patient with an appointment with any available provider other than [facility].

## 2020-04-12 NOTE — Telephone Encounter (Signed)
Scheduled patient with Dr. Delford Field

## 2020-04-16 ENCOUNTER — Encounter: Payer: Self-pay | Admitting: Hematology and Oncology

## 2020-04-18 DIAGNOSIS — F3164 Bipolar disorder, current episode mixed, severe, with psychotic features: Secondary | ICD-10-CM | POA: Diagnosis not present

## 2020-04-23 ENCOUNTER — Encounter: Payer: Self-pay | Admitting: Hematology and Oncology

## 2020-04-23 ENCOUNTER — Telehealth: Payer: Self-pay | Admitting: Hematology and Oncology

## 2020-04-23 NOTE — Telephone Encounter (Signed)
I called the patient on her mobile number but there is no ability to leave a voicemail. Called the phone number that was provided by the patient to speak with her psychiatrist Dr. [name] [phone].  I could not talk to any person on that line and I left a voicemail with another psychiatrist in their group. I reviewed the data that the patient has provided with regards to antiestrogen therapy and valproic acid. She provided me an article that describes a valproic acid can increase estradiol levels. I do not know that the actual estradiol levels are indicator of breast cancer risk. If valproic acid is her best medication for her condition then she needs to continue with it. She also called [place] back stating that she has noticed swelling of the breast.  She is making an appointment to see surgery.

## 2020-04-25 ENCOUNTER — Other Ambulatory Visit: Payer: Self-pay | Admitting: Hematology and Oncology

## 2020-04-25 DIAGNOSIS — D0511 Intraductal carcinoma in situ of right breast: Secondary | ICD-10-CM

## 2020-04-25 NOTE — Progress Notes (Signed)
Patient tells me that she has been pressing on her right breast a lot excessively and she has noticed significant swelling of the right breast and is very concerned about breast cancer. Therefore we would like to set her up for another mammogram and ultrasound for further evaluation. I strongly recommended that she stop irritating her breast any further.  We had a discussion about her psychiatric medications and breast cancer risk. She is extremely concerned about psychiatric medication increasing her breast cancer risk. Upon review of the article that she had provided me, it is my opinion that the psychiatric medications pose a very low risk and could be used. However she is very reluctant to take any further medications of that kind. She is only taking gabapentin. She stopped Depakote.

## 2020-05-04 DIAGNOSIS — D0510 Intraductal carcinoma in situ of unspecified breast: Secondary | ICD-10-CM | POA: Diagnosis not present

## 2020-05-08 ENCOUNTER — Other Ambulatory Visit: Payer: Self-pay

## 2020-05-08 ENCOUNTER — Ambulatory Visit
Admission: RE | Admit: 2020-05-08 | Discharge: 2020-05-08 | Disposition: A | Discharge status: Home / Self Care | Payer: Medicare Other | Source: Ambulatory Visit | Attending: Hematology and Oncology | Admitting: Hematology and Oncology

## 2020-05-08 DIAGNOSIS — R922 Inconclusive mammogram: Secondary | ICD-10-CM | POA: Diagnosis not present

## 2020-05-08 DIAGNOSIS — N644 Mastodynia: Secondary | ICD-10-CM | POA: Diagnosis not present

## 2020-05-08 DIAGNOSIS — D0511 Intraductal carcinoma in situ of right breast: Secondary | ICD-10-CM

## 2020-05-10 ENCOUNTER — Other Ambulatory Visit: Payer: Self-pay

## 2020-05-10 ENCOUNTER — Encounter: Payer: Self-pay | Admitting: Critical Care Medicine

## 2020-05-10 ENCOUNTER — Ambulatory Visit: Payer: Medicare Other | Attending: Critical Care Medicine | Admitting: Critical Care Medicine

## 2020-05-10 VITALS — BP 102/73 | HR 94 | Resp 16 | Ht 68.0 in | Wt 139.0 lb

## 2020-05-10 DIAGNOSIS — D0511 Intraductal carcinoma in situ of right breast: Secondary | ICD-10-CM

## 2020-05-10 DIAGNOSIS — J439 Emphysema, unspecified: Secondary | ICD-10-CM

## 2020-05-10 DIAGNOSIS — Z13220 Encounter for screening for lipoid disorders: Secondary | ICD-10-CM | POA: Diagnosis not present

## 2020-05-10 DIAGNOSIS — Z87891 Personal history of nicotine dependence: Secondary | ICD-10-CM

## 2020-05-10 DIAGNOSIS — F1410 Cocaine abuse, uncomplicated: Secondary | ICD-10-CM

## 2020-05-10 DIAGNOSIS — F5105 Insomnia due to other mental disorder: Secondary | ICD-10-CM

## 2020-05-10 DIAGNOSIS — J4520 Mild intermittent asthma, uncomplicated: Secondary | ICD-10-CM

## 2020-05-10 DIAGNOSIS — E785 Hyperlipidemia, unspecified: Secondary | ICD-10-CM | POA: Diagnosis not present

## 2020-05-10 DIAGNOSIS — R590 Localized enlarged lymph nodes: Secondary | ICD-10-CM

## 2020-05-10 DIAGNOSIS — D352 Benign neoplasm of pituitary gland: Secondary | ICD-10-CM

## 2020-05-10 DIAGNOSIS — Z8659 Personal history of other mental and behavioral disorders: Secondary | ICD-10-CM

## 2020-05-10 DIAGNOSIS — Z1211 Encounter for screening for malignant neoplasm of colon: Secondary | ICD-10-CM

## 2020-05-10 DIAGNOSIS — Z8349 Family history of other endocrine, nutritional and metabolic diseases: Secondary | ICD-10-CM

## 2020-05-10 DIAGNOSIS — G8929 Other chronic pain: Secondary | ICD-10-CM

## 2020-05-10 DIAGNOSIS — M545 Low back pain, unspecified: Secondary | ICD-10-CM

## 2020-05-10 DIAGNOSIS — L708 Other acne: Secondary | ICD-10-CM

## 2020-05-10 DIAGNOSIS — F99 Mental disorder, not otherwise specified: Secondary | ICD-10-CM

## 2020-05-10 DIAGNOSIS — F4310 Post-traumatic stress disorder, unspecified: Secondary | ICD-10-CM

## 2020-05-10 MED ORDER — EPINEPHRINE 0.3 MG/0.3ML IJ SOAJ: 0.3000 mg | INTRAMUSCULAR | 0 refills | Status: DC | PRN

## 2020-05-10 MED ORDER — ALBUTEROL SULFATE HFA 108 (90 BASE) MCG/ACT IN AERS: 2.0000 | INHALATION_SPRAY | Freq: Four times a day (QID) | RESPIRATORY_TRACT | 3 refills | Status: DC | PRN

## 2020-05-10 MED ORDER — GABAPENTIN 300 MG PO CAPS: 300.0000 mg | ORAL_CAPSULE | Freq: Three times a day (TID) | ORAL | 3 refills | Status: DC

## 2020-05-10 NOTE — Assessment & Plan Note (Signed)
Patient endorses ongoing cocaine use patient has active mental health support at this time

## 2020-05-10 NOTE — Assessment & Plan Note (Signed)
Chronic low back pain appears to be stable at this time

## 2020-05-10 NOTE — Progress Notes (Signed)
Found lymph node on Monday on left side of neck. Had mammogram on Tues.   Neck pain

## 2020-05-10 NOTE — Assessment & Plan Note (Signed)
Would benefit from screening but patient declines to obtain alpha-1 antitrypsin screening

## 2020-05-10 NOTE — Assessment & Plan Note (Signed)
New onset small pea size lymph node rubbery and mobile in the upper area of the cervical lymph node chain just under the jaw on the right We will perform imaging studies with CT of the chest and neck

## 2020-05-10 NOTE — Assessment & Plan Note (Signed)
Care per oncology follow-up imaging per oncology

## 2020-05-10 NOTE — Assessment & Plan Note (Signed)
Chronic acne will refill Retin-A for this patient

## 2020-05-10 NOTE — Assessment & Plan Note (Signed)
Previously assessed stable at this time no active
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
## 2020-05-10 NOTE — Assessment & Plan Note (Signed)
Mild intermittent asthma with associated pulmonary emphysema on imaging studies  Follow-up per pulmonary medicine Note elevated eosinophil counts on blood smears

## 2020-05-10 NOTE — Assessment & Plan Note (Signed)
Patient on low-dose melatonin advised to consider a 15 mg daily doses an hour before sleep

## 2020-05-10 NOTE — Assessment & Plan Note (Signed)
History of suicidal ideation not active at this time
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
## 2020-05-10 NOTE — Patient Instructions (Addendum)
Labs :  Cologuard test sent to home,  Lipid panel.  BMP: renal fxn check for IV contrast  Refills on your inhaler, gabapentin, epi pen sent to [facility]  CT Chest and Neck will be scheduled with contrast  Please discuss Evushield monoclonal antibody with Dr Lindi Adie since you are unvaccinated  Return Dr Joya Gaskins 6 weeks  We discussed all of your prior diagnosed health problems, continue to follow up with your specialists.

## 2020-05-10 NOTE — Assessment & Plan Note (Signed)
  .   Current smoking consumption amount: 1 pack a day ° °• Dicsussion on advise to quit smoking and smoking impacts: Cardiovascular lung impacts ° °• Patient's willingness to quit: Not yet willing to quit ° °• Methods to quit smoking discussed: Behavioral modification ° °• Medication management of smoking session drugs discussed: Not a candidate for Chantix, has failed nicotine replacement in the past ° °• Resources provided:  AVS  ° °• Setting quit date not established ° °• Follow-up arranged 6 weeks ° ° °Time spent counseling the patient: 5 minutes ° °

## 2020-05-11 LAB — BASIC METABOLIC PANEL
BUN/Creatinine Ratio: 23 (ref 9–23)
BUN: 17 mg/dL (ref 6–24)
CO2: 24 mmol/L (ref 20–29)
Calcium: 9.3 mg/dL (ref 8.7–10.2)
Chloride: 102 mmol/L (ref 96–106)
Creatinine, Ser: 0.74 mg/dL (ref 0.57–1.00)
GFR calc Af Amer: 111 mL/min/{1.73_m2} (ref >59)
GFR calc non Af Amer: 96 mL/min/{1.73_m2} (ref >59)
Glucose: 71 mg/dL (ref 65–99)
Potassium: 4.7 mmol/L (ref 3.5–5.2)
Sodium: 141 mmol/L (ref 134–144)

## 2020-05-11 LAB — LIPID PANEL
Chol/HDL Ratio: 2.6 ratio (ref 0.0–4.4)
Cholesterol, Total: 181 mg/dL (ref 100–199)
HDL: 70 mg/dL (ref >39)
LDL Chol Calc (NIH): 83 mg/dL (ref 0–99)
Triglycerides: 165 mg/dL — ABNORMAL HIGH (ref 0–149)
VLDL Cholesterol Cal: 28 mg/dL (ref 5–40)

## 2020-05-14 ENCOUNTER — Encounter: Payer: Self-pay | Admitting: *Deleted

## 2020-05-14 DIAGNOSIS — H6500 Acute serous otitis media, unspecified ear: Secondary | ICD-10-CM | POA: Diagnosis not present

## 2020-05-14 DIAGNOSIS — R591 Generalized enlarged lymph nodes: Secondary | ICD-10-CM | POA: Diagnosis not present

## 2020-05-14 DIAGNOSIS — Z20822 Contact with and (suspected) exposure to covid-19: Secondary | ICD-10-CM | POA: Diagnosis not present

## 2020-05-14 DIAGNOSIS — R5383 Other fatigue: Secondary | ICD-10-CM | POA: Diagnosis not present

## 2020-05-14 DIAGNOSIS — H6501 Acute serous otitis media, right ear: Secondary | ICD-10-CM | POA: Diagnosis not present

## 2020-05-18 ENCOUNTER — Other Ambulatory Visit: Payer: Self-pay

## 2020-05-18 ENCOUNTER — Ambulatory Visit (HOSPITAL_COMMUNITY): Payer: Medicare Other

## 2020-05-18 ENCOUNTER — Ambulatory Visit (HOSPITAL_COMMUNITY)
Admission: RE | Admit: 2020-05-18 | Discharge: 2020-05-18 | Disposition: A | Discharge status: Home / Self Care | Payer: Medicare Other | Source: Ambulatory Visit | Attending: Critical Care Medicine | Admitting: Critical Care Medicine

## 2020-05-18 DIAGNOSIS — R590 Localized enlarged lymph nodes: Secondary | ICD-10-CM | POA: Insufficient documentation

## 2020-05-18 DIAGNOSIS — H6123 Impacted cerumen, bilateral: Secondary | ICD-10-CM | POA: Insufficient documentation

## 2020-05-18 DIAGNOSIS — R918 Other nonspecific abnormal finding of lung field: Secondary | ICD-10-CM | POA: Diagnosis not present

## 2020-05-18 DIAGNOSIS — Z853 Personal history of malignant neoplasm of breast: Secondary | ICD-10-CM | POA: Diagnosis not present

## 2020-05-18 DIAGNOSIS — M50323 Other cervical disc degeneration at C6-C7 level: Secondary | ICD-10-CM | POA: Diagnosis not present

## 2020-05-18 DIAGNOSIS — J3489 Other specified disorders of nose and nasal sinuses: Secondary | ICD-10-CM | POA: Diagnosis not present

## 2020-05-18 DIAGNOSIS — M50322 Other cervical disc degeneration at C5-C6 level: Secondary | ICD-10-CM | POA: Diagnosis not present

## 2020-05-18 DIAGNOSIS — H938X3 Other specified disorders of ear, bilateral: Secondary | ICD-10-CM | POA: Diagnosis not present

## 2020-05-18 DIAGNOSIS — J432 Centrilobular emphysema: Secondary | ICD-10-CM | POA: Diagnosis not present

## 2020-05-18 DIAGNOSIS — J439 Emphysema, unspecified: Secondary | ICD-10-CM | POA: Insufficient documentation

## 2020-05-18 DIAGNOSIS — F17210 Nicotine dependence, cigarettes, uncomplicated: Secondary | ICD-10-CM | POA: Diagnosis not present

## 2020-05-18 MED ORDER — DOXYCYCLINE HYCLATE 100 MG PO TABS: 100.0000 mg | ORAL_TABLET | Freq: Two times a day (BID) | ORAL | 0 refills | Status: DC

## 2020-05-18 MED ORDER — IOHEXOL 300 MG/ML  SOLN
75.0000 mL | Freq: Once | INTRAMUSCULAR | Status: AC | PRN
  Administered 2020-05-18: 75 mL via INTRAVENOUS

## 2020-05-18 NOTE — Telephone Encounter (Signed)
This pt has recurrent ear infections.   I sent doxycycline x 10day course but she needs OV next week with any provider to check and clean out her ears.

## 2020-05-19 NOTE — Progress Notes (Signed)
Pt is aware of results and plan of care

## 2020-05-22 ENCOUNTER — Encounter: Payer: Self-pay | Admitting: Allergy & Immunology

## 2020-05-22 ENCOUNTER — Other Ambulatory Visit: Payer: Self-pay

## 2020-05-22 ENCOUNTER — Ambulatory Visit (INDEPENDENT_AMBULATORY_CARE_PROVIDER_SITE_OTHER): Payer: Medicare Other | Admitting: Allergy & Immunology

## 2020-05-22 VITALS — BP 126/80 | HR 80 | Temp 98.7°F | Ht 68.0 in | Wt 142.4 lb

## 2020-05-22 DIAGNOSIS — T783XXD Angioneurotic edema, subsequent encounter: Secondary | ICD-10-CM

## 2020-05-22 MED ORDER — CETIRIZINE HCL 10 MG PO TABS: 10.0000 mg | ORAL_TABLET | Freq: Every day | ORAL | 5 refills | Status: DC

## 2020-05-22 NOTE — Progress Notes (Signed)
FOLLOW UP  Date of Service/Encounter:  05/22/20   Assessment:   Angioedema  Plan/Recommendations:   1. Angioedema of the face  - Start prednisone burst provided today.  - We will get those labs that Dr. Shaune Leeks ordered a few years ago.  - We will call you in 1-2 weeks with the results of the testing.  - Complete the doxycycline course.  - I am not going to order a facial MRI. - You can certainly talk to the dentist about this. - Continue to note any triggers. - I do not see a need for an additional antibiotics since we have not even completed the first antibiotic.  2. Return in about 6 months (around 11/19/2020).   Subjective:   DANIELLE MINK is a 50 y.o. female presenting today for follow up of  Chief Complaint  Patient presents with   Angioedema    Inside nose and sides of face - started late Jan.    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   Cocaine abuse ([place]) 09/08/2018   History of suicidal ideation 09/08/2018   Other allergic rhinitis 02/02/2018   Allergy, unspecified, subsequent encounter 02/02/2018   Mild intermittent asthma without complication 60/45/4098   Angio-edema 02/02/2018   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   Gastroesophageal reflux disease without esophagitis 02/02/2018   Chronic dental pain 01/20/2018   Chronic facial pain 01/20/2018   Prolonged Q-T interval on ECG 01/02/2018   Environmental allergies 10/29/2017   Pain in joint of left shoulder 07/15/2017   Neck pain 07/15/2017   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   Chronic migraine without aura without status migrainosus, not intractable  03/24/2017   Chronic midline low back pain without sciatica 03/24/2017   Pulmonary emphysema ([place]) 02/19/2017   Jaw pain 02/11/2017   Smoking history 12/09/2016   Exertional dyspnea 12/09/2016   Family history of alpha 1 antitrypsin deficiency 09/04/2016   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   Nodule of left lung 09/04/2016   Cervical lymphadenopathy 01/29/2016   Perimenopausal vasomotor symptoms 05/25/2015   Chronic rhinitis 04/18/2015   Pituitary microadenoma ([place]) 05/19/2014   Endometriosis of ovary 04/03/2014   Ovarian cyst, right 04/03/2014   Posttraumatic stress disorder 09/13/2012   Fibrocystic breast disease 08/24/2012   Myalgia and myositis 07/26/2012   Depression 07/26/2012   PTSD (post-traumatic stress disorder) 01/09/2009   INSOMNIA 01/17/2008   NEVI, MULTIPLE 07/17/2006   KERATOSIS, SEBORRHEIC NEC 07/17/2006   ACNE NEC 07/17/2006    History obtained from: chart review and patient.  Alphonsine is a 50 y.o. female presenting for a follow up visit.  She has a history of angioedema.  She was last seen by Dr. Shaune Leeks in October 2019.  At that time, she was continued on Claritin daily, Pepcid 20 mg twice daily, Flonase, albuterol, and was started on a prednisone burst.  She was followed by Dr. Neldon Mc and was on a nut free diet for a couple years.  However, she then put nuts back into her diet and did fine.  She therefore followed with Dr. Shaune Leeks after that.  However, her  compliance with follow-ups has been intermittent at best.  For weeks, she reports that her "mucous membranes" are swollen and "everything" is swollen. She was told that she might have a bad tooth on one side. She had a CT of her neck which was negative.   She was seen by some kind of provider on [facility] and was put on some type of medication.  Her history is rather vague. She has been on that for "days" without improvement. She then saw ENT on [address]. She had "wax  vacuumed out of her ears". She is having worsened environmental allergies. She has stopped drinking coffee and smoking. She did stop and it helped. She has since restarted it.   She reports a long standing history of facial swelling for years. She thinks she has a bad tooth. She said she had to have a bad tooth diagnosed by an MRI. She is requesting an MRI of her head. She is having a tooth cleaning tomorrow. She will bring it up then and see if they can order it.  She does have a history of some type of maxillofacial implant.  This is caused some issues, but she has looked into getting them removed and it would require a trip to [facility], at least according to the patient.   She did have some labs ordered in October 2019.  These never collected.  She would like to go ahead and get these done.      Otherwise, there have been no changes to her past medical history, surgical history, family history, or social history.    Review of Systems  Constitutional: Negative.  Negative for chills, fever, malaise/fatigue and weight loss.  HENT: Positive for congestion. Negative for ear discharge, ear pain and sinus pain.   Eyes: Negative for pain, discharge and redness.  Respiratory: Negative for cough, sputum production, shortness of breath and wheezing.   Cardiovascular: Negative.  Negative for chest pain and palpitations.  Gastrointestinal: Negative for abdominal pain, constipation, diarrhea, heartburn, nausea and vomiting.  Skin: Negative.  Negative for itching and rash.       Positive for angioedema.  Positive for facial pain.  Neurological: Negative for dizziness and headaches.  Endo/Heme/Allergies: Positive for environmental allergies. Does not bruise/bleed easily.       Objective:   Blood pressure 126/80, pulse 80, temperature 98.7 F (37.1 C), height 5\' 8"  (1.727 m), weight 142 lb 6.4 oz (64.6 kg), last menstrual period 11/21/2014, SpO2 97 %. Body mass index is 21.65 kg/m.   Physical  Exam:  Physical Exam Constitutional:      Appearance: She is well-developed.     Comments: Very talkative.  Does not really finish a full sentence.  HENT:     Head: Normocephalic and atraumatic.     Comments: I cannot appreciate much in the way of any, as this is the first time I seen her.  She does point to her bilateral maxillary sinuses as being enlarged.    Right Ear: Tympanic membrane, ear canal and external ear normal.     Left Ear: Tympanic membrane, ear canal and external ear normal.     Nose: Mucosal edema and rhinorrhea present. No nasal deformity, septal deviation or epistaxis.     Right Turbinates: Enlarged.     Left Turbinates: Enlarged.     Right Sinus: No maxillary sinus tenderness or frontal sinus tenderness.     Left Sinus: No maxillary sinus tenderness or frontal sinus tenderness.  Mouth/Throat:     Mouth: Oropharynx is clear and moist. Mucous membranes are not pale and not dry.     Pharynx: Uvula midline.  Eyes:     General:        Right eye: No discharge.        Left eye: No discharge.     Extraocular Movements: EOM normal.     Conjunctiva/sclera: Conjunctivae normal.     Right eye: Right conjunctiva is not injected. No chemosis.    Left eye: Left conjunctiva is not injected. No chemosis.    Pupils: Pupils are equal, round, and reactive to light.  Cardiovascular:     Rate and Rhythm: Normal rate and regular rhythm.     Heart sounds: Normal heart sounds.  Pulmonary:     Effort: Pulmonary effort is normal. No tachypnea, accessory muscle usage or respiratory distress.     Breath sounds: Normal breath sounds. No wheezing, rhonchi or rales.     Comments: Moving air well in all lung fields.  No increased work of breathing. Chest:     Chest wall: No tenderness.  Lymphadenopathy:     Cervical: No cervical adenopathy.  Skin:    General: Skin is warm.     Capillary Refill: Capillary refill takes less than 2 seconds.     Coloration: Skin is not pale.      Findings: No abrasion, erythema, petechiae or rash. Rash is not papular, urticarial or vesicular.     Comments: No eczematous or urticarial lesions noted.  Neurological:     Mental Status: She is alert.  Psychiatric:        Mood and Affect: Mood and affect normal.      Diagnostic studies: labs sent instead    Salvatore Marvel, MD  [facility] [place] [facility] [place]

## 2020-05-22 NOTE — Patient Instructions (Addendum)
1. Angioedema of the face  - Start prednisone burst provided today.  - We will get those labs that Dr. Shaune Leeks ordered a few years ago.  - We will call you in 1-2 weeks with the results of the testing.  - Complete the doxycycline course.  2. Return in about 6 months (around 11/19/2020).   Please inform us of any Emergency Department visits, hospitalizations, or changes in symptoms. Call us before going to the ED for breathing or allergy symptoms since we might be able to fit you in for a sick visit. Feel free to contact us anytime with any questions, problems, or concerns.  It was a pleasure to meet you today!   Websites that have reliable patient information: 1. American Academy of Asthma, Allergy, and Immunology: www.aaaai.org 2. Food Allergy Research and Education (FARE): foodallergy.org 3. Mothers of Asthmatics: http://www.asthmacommunitynetwork.org 4. American College of Allergy, Asthma, and Immunology: www.acaai.org   COVID-19 Vaccine Information can be found at: [email or web address].[email or web address].uk For questions related to vaccine distribution or appointments, please email [email or web address] or call [phone].   We realize that you might be concerned about having an allergic reaction to the COVID19 vaccines. To help with that concern, WE ARE OFFERING THE COVID19 VACCINES IN OUR OFFICE! Ask the front desk for dates!     "Like" [place] on Facebook and Instagram for our latest updates!      A health democracy works best when [employer] participate! Make sure you are registered to vote! If you have moved or changed any of your contact information, you will need to get this updated before voting!  In some cases, you MAY be able to register to vote online: [email or web address]

## 2020-05-23 NOTE — Telephone Encounter (Signed)
Call placed to patient to get her scheduled with Freeman Caldron tomorrow. Patient stated she had her ears cleaned out by ENT and would just keep her next appointment with Dr. Joya Gaskins.

## 2020-05-24 ENCOUNTER — Encounter: Payer: Self-pay | Admitting: Allergy & Immunology

## 2020-05-24 ENCOUNTER — Ambulatory Visit: Payer: Medicare Other | Admitting: Critical Care Medicine

## 2020-05-26 LAB — ALPHA-GAL PANEL
Allergen Lamb IgE: <0.1 kU/L
Beef IgE: <0.1 kU/L
IgE (Immunoglobulin E), Serum: 2 IU/mL — ABNORMAL LOW (ref 6–495)
O215-IgE Alpha-Gal: <0.1 kU/L
Pork IgE: <0.1 kU/L

## 2020-05-30 LAB — ALLERGEN, GINGER, RF270: Allergen Ginger IgE: <0.1 kU/L

## 2020-05-30 LAB — C1 ESTERASE INHIBITOR: C1INH SerPl-mCnc: 30 mg/dL (ref 21–39)

## 2020-05-30 LAB — ALLERGEN, TURMERIC, IGE
Class Interpretation: 0
Turmeric IgE*: <0.35 kU/L (ref <0.35)

## 2020-05-30 LAB — COMPLEMENT COMPONENT C1Q: Complement C1Q: 11.5 mg/dL (ref 10.3–20.5)

## 2020-05-30 LAB — ALLERGEN, CORIANDER/CILANTRO IGE: F317-IgE Coriander/Cilantro: <0.1 kU/L

## 2020-05-30 LAB — ALLERGEN,CHILI PEPPER,RF279: F279-IgE Chili Pepper: <0.1 kU/L

## 2020-05-30 LAB — C1 ESTERASE INHIBITOR, FUNCTIONAL: C1INH Functional/C1INH Total MFr SerPl: 94 %mean normal

## 2020-05-30 LAB — C3 AND C4
Complement C3, Serum: 87 mg/dL (ref 82–167)
Complement C4, Serum: 15 mg/dL (ref 12–38)

## 2020-05-30 LAB — ALLERGEN AVOCADO F96: F096-IgE Avocado: <0.1 kU/L

## 2020-05-30 LAB — ALLERGEN, CUMIN SEED: F265-IgE Cumin: <0.1 kU/L

## 2020-05-30 LAB — TRYPTASE: Tryptase: 9.8 ug/L (ref 2.2–13.2)

## 2020-06-14 DIAGNOSIS — F4312 Post-traumatic stress disorder, chronic: Secondary | ICD-10-CM | POA: Diagnosis not present

## 2020-06-19 ENCOUNTER — Other Ambulatory Visit: Payer: Self-pay

## 2020-06-19 DIAGNOSIS — D0511 Intraductal carcinoma in situ of right breast: Secondary | ICD-10-CM

## 2020-06-20 ENCOUNTER — Ambulatory Visit: Payer: Medicare Other | Attending: Critical Care Medicine | Admitting: Critical Care Medicine

## 2020-06-20 ENCOUNTER — Other Ambulatory Visit: Payer: Self-pay

## 2020-06-20 ENCOUNTER — Encounter: Payer: Self-pay | Admitting: Critical Care Medicine

## 2020-06-20 VITALS — BP 108/75 | HR 77 | Resp 16 | Wt 145.0 lb

## 2020-06-20 DIAGNOSIS — E349 Endocrine disorder, unspecified: Secondary | ICD-10-CM | POA: Diagnosis not present

## 2020-06-20 DIAGNOSIS — F17210 Nicotine dependence, cigarettes, uncomplicated: Secondary | ICD-10-CM | POA: Diagnosis not present

## 2020-06-20 DIAGNOSIS — R7989 Other specified abnormal findings of blood chemistry: Secondary | ICD-10-CM | POA: Diagnosis not present

## 2020-06-20 DIAGNOSIS — J0101 Acute recurrent maxillary sinusitis: Secondary | ICD-10-CM | POA: Diagnosis not present

## 2020-06-20 DIAGNOSIS — J431 Panlobular emphysema: Secondary | ICD-10-CM | POA: Insufficient documentation

## 2020-06-20 DIAGNOSIS — Z86018 Personal history of other benign neoplasm: Secondary | ICD-10-CM | POA: Diagnosis not present

## 2020-06-20 DIAGNOSIS — D352 Benign neoplasm of pituitary gland: Secondary | ICD-10-CM | POA: Insufficient documentation

## 2020-06-20 DIAGNOSIS — D0511 Intraductal carcinoma in situ of right breast: Secondary | ICD-10-CM

## 2020-06-20 DIAGNOSIS — H6123 Impacted cerumen, bilateral: Secondary | ICD-10-CM | POA: Insufficient documentation

## 2020-06-20 DIAGNOSIS — Z853 Personal history of malignant neoplasm of breast: Secondary | ICD-10-CM | POA: Insufficient documentation

## 2020-06-20 DIAGNOSIS — L708 Other acne: Secondary | ICD-10-CM | POA: Diagnosis not present

## 2020-06-20 DIAGNOSIS — L709 Acne, unspecified: Secondary | ICD-10-CM | POA: Insufficient documentation

## 2020-06-20 DIAGNOSIS — Z791 Long term (current) use of non-steroidal anti-inflammatories (NSAID): Secondary | ICD-10-CM | POA: Insufficient documentation

## 2020-06-20 DIAGNOSIS — R200 Anesthesia of skin: Secondary | ICD-10-CM | POA: Insufficient documentation

## 2020-06-20 MED ORDER — TRETINOIN 0.025 % EX CREA: TOPICAL_CREAM | Freq: Every day | CUTANEOUS | 0 refills | Status: DC

## 2020-06-20 MED ORDER — IBUPROFEN 800 MG PO TABS: 800.0000 mg | ORAL_TABLET | Freq: Three times a day (TID) | ORAL | 1 refills | Status: DC

## 2020-06-20 NOTE — Assessment & Plan Note (Signed)
History of pituitary microadenoma patient wishes a beta hCG and prolactin level rechecked

## 2020-06-20 NOTE — Assessment & Plan Note (Signed)
Patient is tried a variety of smoking cessation techniques  Patient is failed all of these continues to smoke half pack a day of cigarettes  She notes history of emphysema which is seen on recent CT imaging  Continue albuterol as needed  Referral for acupuncture to see if this will help in smoking cessation

## 2020-06-20 NOTE — Assessment & Plan Note (Signed)
Right lumpectomy: Dr. 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 Adjuvant radiation therapy: Patient discussed with Dr. Sondra Come and agreed to radiation. 2.Opted against anti estrogen therapy.  ____________________________________________________________________________________________ Maxillofacial CT scan 11/22/2019: Normal  Dr. Dorethea Clan is her psychiatrist and she wants [place] to get in touch with her to see if valproic acid can be discontinued and some other medication can be used instead.  Mammogram 10/26/2019: Benign breast density category D Return to clinic in1 year with labs and follow-up

## 2020-06-20 NOTE — Patient Instructions (Signed)
Prolactin and hCG level obtained  Refills on ibuprofen Retin-A sent to your pharmacy  Your ears were cleaned out at this visit  No other medication changes  We discussed your smoking use and need to reduce  We gave your recommendations for potential acupuncture for smoking cessation considering seeing [facility] on [address]  Return to see Dr. Joya Gaskins 3 months   Tobacco Use Disorder Tobacco use disorder (TUD) occurs when a person craves, seeks, and uses tobacco, regardless of the consequences. This disorder can cause problems with mental and physical health. It can affect your ability to have healthy relationships, and it can keep you from meeting your responsibilities at work, home, or school. Tobacco may be:  Smoked as a cigarette or cigar.  Inhaled using e-cigarettes.  Smoked in a pipe or hookah.  Chewed as smokeless tobacco.  Inhaled into the nostrils as snuff. Tobacco products contain a dangerous chemical called nicotine, which is very addictive. Nicotine triggers hormones that make the body feel stimulated and works on areas of the brain that make you feel good. These effects can make it hard for people to quit nicotine. Tobacco contains many other unsafe chemicals that can damage almost every organ in the body. Smoking tobacco also puts others in danger due to fire risk and possible health problems caused by breathing in secondhand smoke. What are the signs or symptoms? Symptoms of TUD may include:  Being unable to slow down or stop your tobacco use.  Spending an abnormal amount of time getting or using tobacco.  Craving tobacco. Cravings may last for up to 6 months after quitting.  Tobacco use that: ? Interferes with your work, school, or home life. ? Interferes with your personal and social relationships. ? Makes you give up activities that you once enjoyed or found important.  Using tobacco even though you know that it is: ? Dangerous or bad  for your health or someone else's health. ? Causing problems in your life.  Needing more and more of the substance to get the same effect (developing tolerance).  Experiencing unpleasant symptoms if you do not use the substance (withdrawal). Withdrawal symptoms may include: ? Depressed, anxious, or irritable mood. ? Difficulty concentrating. ? Increased appetite. ? Restlessness or trouble sleeping.  Using the substance to avoid withdrawal. How is this diagnosed? This condition may be diagnosed based on:  Your current and past tobacco use. Your health care provider may ask questions about how your tobacco use affects your life.  A physical exam. You may be diagnosed with TUD if you have at least two symptoms within a 7-month period. How is this treated? This condition is treated by stopping tobacco use. Many people are unable to quit on their own and need help. Treatment may include:  Nicotine replacement therapy (NRT). NRT provides nicotine without the other harmful chemicals in tobacco. NRT gradually lowers the dosage of nicotine in the body and reduces withdrawal symptoms. NRT is available as: ? Over-the-counter gums, lozenges, and skin patches. ? Prescription mouth inhalers and nasal sprays.  Medicine that acts on the brain to reduce cravings and withdrawal symptoms.  A type of talk therapy that examines your triggers for tobacco use, how to avoid them, and how to cope with cravings (behavioral therapy).  Hypnosis. This may help with withdrawal symptoms.  Joining a support group for others coping with TUD. The best treatment for TUD is usually a combination of medicine, talk therapy, and support groups. Recovery can be a  long process. Many people start using tobacco again after stopping (relapse). If you relapse, it does not mean that treatment will not work. Follow these instructions at home: Lifestyle  Do not use any products that contain nicotine or tobacco, such as  cigarettes and e-cigarettes.  Avoid things that trigger tobacco use as much as you can. Triggers include people and situations that usually cause you to use tobacco.  Avoid drinks that contain caffeine, including coffee. These may worsen some withdrawal symptoms.  Find ways to manage stress. Wanting to smoke may cause stress, and stress can make you want to smoke. Relaxation techniques such as deep breathing, meditation, and yoga may help.  Attend support groups as needed. These groups are an important part of long-term recovery for many people. General instructions  Take over-the-counter and prescription medicines only as told by your health care provider.  Check with your health care provider before taking any new prescription or over-the-counter medicines.  Decide on a friend, family member, or smoking quit-line (such as 1-800-QUIT-NOW in the [place] that you can call or text when you feel the urge to smoke or when you need help coping with cravings.  Keep all follow-up visits as told by your health care provider and therapist. This is important.   Contact a health care provider if:  You are not able to take your medicines as prescribed.  Your symptoms get worse, even with treatment. Summary  Tobacco use disorder (TUD) occurs when a person craves, seeks, and uses tobacco regardless of the consequences.  This condition may be diagnosed based on your current and past tobacco use and a physical exam.  Many people are unable to quit on their own and need help. Recovery can be a long process.  The most effective treatment for TUD is usually a combination of medicine, talk therapy, and support groups. This information is not intended to replace advice given to you by your health care provider. Make sure you discuss any questions you have with your health care provider. Document Revised: 03/11/2017 Document Reviewed: 03/11/2017 Elsevier Patient Education  2021 [employer].

## 2020-06-20 NOTE — Assessment & Plan Note (Signed)
Recurrent maxillary sinusitis has resolved with recent antibiotics

## 2020-06-20 NOTE — Assessment & Plan Note (Signed)
Follow up per oncology.
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
## 2020-06-20 NOTE — Assessment & Plan Note (Signed)
Patient has mild cerumen buildup we applied Debrox and lavage both ears

## 2020-06-20 NOTE — Assessment & Plan Note (Signed)
Prescription for Retin-A sent for the patient

## 2020-06-21 ENCOUNTER — Telehealth: Payer: Self-pay | Admitting: Critical Care Medicine

## 2020-06-21 ENCOUNTER — Other Ambulatory Visit: Payer: Self-pay

## 2020-06-21 ENCOUNTER — Inpatient Hospital Stay: Payer: Medicare Other

## 2020-06-21 ENCOUNTER — Inpatient Hospital Stay: Payer: Medicare Other | Attending: Hematology and Oncology | Admitting: Hematology and Oncology

## 2020-06-21 ENCOUNTER — Telehealth: Payer: Self-pay

## 2020-06-21 DIAGNOSIS — D0511 Intraductal carcinoma in situ of right breast: Secondary | ICD-10-CM | POA: Insufficient documentation

## 2020-06-21 DIAGNOSIS — Z923 Personal history of irradiation: Secondary | ICD-10-CM | POA: Diagnosis not present

## 2020-06-21 DIAGNOSIS — Z170 Estrogen receptor positive status [ER+]: Secondary | ICD-10-CM | POA: Diagnosis not present

## 2020-06-21 DIAGNOSIS — Z9221 Personal history of antineoplastic chemotherapy: Secondary | ICD-10-CM | POA: Diagnosis not present

## 2020-06-21 LAB — CBC WITH DIFFERENTIAL (CANCER CENTER ONLY)
Abs Immature Granulocytes: 0.01 10*3/uL (ref 0.00–0.07)
Basophils Absolute: 0 10*3/uL (ref 0.0–0.1)
Basophils Relative: 1 %
Eosinophils Absolute: 0.5 10*3/uL (ref 0.0–0.5)
Eosinophils Relative: 7 %
HCT: 41.1 % (ref 36.0–46.0)
Hemoglobin: 14.1 g/dL (ref 12.0–15.0)
Immature Granulocytes: 0 %
Lymphocytes Relative: 23 %
Lymphs Abs: 1.8 10*3/uL (ref 0.7–4.0)
MCH: 32.8 pg (ref 26.0–34.0)
MCHC: 34.3 g/dL (ref 30.0–36.0)
MCV: 95.6 fL (ref 80.0–100.0)
Monocytes Absolute: 0.5 10*3/uL (ref 0.1–1.0)
Monocytes Relative: 6 %
Neutro Abs: 5.1 10*3/uL (ref 1.7–7.7)
Neutrophils Relative %: 63 %
Platelet Count: 283 10*3/uL (ref 150–400)
RBC: 4.3 MIL/uL (ref 3.87–5.11)
RDW: 13.4 % (ref 11.5–15.5)
WBC Count: 7.9 10*3/uL (ref 4.0–10.5)
nRBC: 0 % (ref 0.0–0.2)

## 2020-06-21 LAB — CMP (CANCER CENTER ONLY)
ALT: 37 U/L (ref 0–44)
AST: 24 U/L (ref 15–41)
Albumin: 4.1 g/dL (ref 3.5–5.0)
Alkaline Phosphatase: 75 U/L (ref 38–126)
Anion gap: 6 (ref 5–15)
BUN: 11 mg/dL (ref 6–20)
CO2: 27 mmol/L (ref 22–32)
Calcium: 9.1 mg/dL (ref 8.9–10.3)
Chloride: 107 mmol/L (ref 98–111)
Creatinine: 0.77 mg/dL (ref 0.44–1.00)
GFR, Estimated: >60 mL/min (ref >60)
Glucose, Bld: 75 mg/dL (ref 70–99)
Potassium: 3.9 mmol/L (ref 3.5–5.1)
Sodium: 140 mmol/L (ref 135–145)
Total Bilirubin: 0.3 mg/dL (ref 0.3–1.2)
Total Protein: 7 g/dL (ref 6.5–8.1)

## 2020-06-21 LAB — HCG, SERUM, QUALITATIVE: hCG,Beta Subunit,Qual,Serum: NEGATIVE m[IU]/mL (ref <6)

## 2020-06-21 LAB — PROLACTIN: Prolactin: 12 ng/mL (ref 4.8–23.3)

## 2020-06-21 NOTE — Progress Notes (Signed)
Patient Care Team: Elsie Stain, MD as PCP - General (Pulmonary Disease) Services, [facility] as Referring Physician Christen Butter, MD as Referring Physician (Gynecology) Melida Quitter, MD as Consulting Physician (Otolaryngology) Loretta Plume as Consulting Physician (Neurosurgery)  DIAGNOSIS:    ICD-10-CM   1. Ductal carcinoma in situ (DCIS) of right breast  D05.11 MR BREAST BILATERAL W WO CONTRAST INC CAD    SUMMARY OF ONCOLOGIC HISTORY: Oncology History  Ductal carcinoma in situ (DCIS) of right breast  06/29/2019 Initial Diagnosis   Right breast biopsy: Fibrocystic changes, pseudoangiomatous stromal hyperplasia   07/21/2019 Surgery   Right lumpectomy: Dr. Lucia Gaskins: Intermediate grade DCIS 0.2 cm, 0.1 cm from anterior margin, LCIS, ER 95%, PR 30% Tis NX stage 0   07/21/2019 Cancer Staging   Staging form: Breast, AJCC 8th Edition - Pathologic stage from 07/21/2019: Stage 0 (pTis (DCIS), pN0, cM0, ER+, PR+) - Signed by Gardenia Phlegm, NP on 08/03/2019     CHIEF COMPLIANT: Follow-up of right breast DCIS  INTERVAL HISTORY: Catherine Olsen is a 50 y.o. with above-mentioned history of right breast DCIS who underwent a right lumpectomy, and has declined radiation and antiestrogen therapy.Mammogram on 05/08/20 showed no evidence of malignancy bilaterally.She presents to the clinic todayfor follow-up.   She has felt abnormalities in the right breast especially around the nipple and areola and is very concerned about breast cancer recurrence.  She is extremely anxious and terrified about it.  ALLERGIES:  is allergic to other, prednisone, sulfamethoxazole, oxycodone, abilify [aripiprazole], celebrex [celecoxib], molds & smuts, penicillins, tylenol [acetaminophen], percocet [oxycodone-acetaminophen], sulfa antibiotics, and sulfites.  MEDICATIONS:  Current Outpatient Medications  Medication Sig Dispense Refill  . albuterol (VENTOLIN HFA) 108 (90 Base) MCG/ACT inhaler  Inhale 2 puffs into the lungs every 6 (six) hours as needed for wheezing or shortness of breath. 8 g 3  . cetirizine (ZYRTEC) 10 MG tablet Take 1 tablet (10 mg total) by mouth daily. (Patient not taking: Reported on 06/20/2020) 30 tablet 5  . EPINEPHrine 0.3 mg/0.3 mL IJ SOAJ injection Inject 0.3 mg into the skin as needed for anaphylaxis. (Patient not taking: Reported on 06/20/2020) 1 each 0  . gabapentin (NEURONTIN) 300 MG capsule Take 1 capsule (300 mg total) by mouth 3 (three) times daily. 90 capsule 3  . ibuprofen (ADVIL) 800 MG tablet Take 1 tablet (800 mg total) by mouth 3 (three) times daily. 90 tablet 1  . Melatonin 10 MG TABS Take 10 mg by mouth at bedtime.    . tretinoin (RETIN-A) 0.025 % cream Apply topically at bedtime. For acne 45 g 0   No current facility-administered medications for this visit.    PHYSICAL EXAMINATION: ECOG PERFORMANCE STATUS: 1 - Symptomatic but completely ambulatory  Vitals:   06/21/20 1526  BP: 111/78  Pulse: 86  Resp: 20  Temp: 97.9 F (36.6 C)  SpO2: 98%   Filed Weights   06/21/20 1526  Weight: 143 lb 14.4 oz (65.3 kg)    BREAST: No palpable masses or nodules in either right or left breasts. No palpable axillary supraclavicular or infraclavicular adenopathy no breast tenderness or nipple discharge. (exam performed in the presence of a chaperone)  LABORATORY DATA:  I have reviewed the data as listed CMP Latest Ref Rng & Units 06/21/2020 05/10/2020 03/22/2020  Glucose 70 - 99 mg/dL 75 71 77  BUN 6 - 20 mg/dL 11 17 17   Creatinine 0.44 - 1.00 mg/dL 0.77 0.74 0.75  Sodium 135 - 145 mmol/L  140 141 143  Potassium 3.5 - 5.1 mmol/L 3.9 4.7 4.3  Chloride 98 - 111 mmol/L 107 102 108  CO2 22 - 32 mmol/L 27 24 29   Calcium 8.9 - 10.3 mg/dL 9.1 9.3 8.7(L)  Total Protein 6.5 - 8.1 g/dL 7.0 - 6.6  Total Bilirubin 0.3 - 1.2 mg/dL 0.3 - 0.2(L)  Alkaline Phos 38 - 126 U/L 75 - 75  AST 15 - 41 U/L 24 - 29  ALT 0 - 44 U/L 37 - 47(H)    Lab Results   Component Value Date   WBC 7.9 06/21/2020   HGB 14.1 06/21/2020   HCT 41.1 06/21/2020   MCV 95.6 06/21/2020   PLT 283 06/21/2020   NEUTROABS 5.1 06/21/2020    ASSESSMENT & PLAN:  Ductal carcinoma in situ (DCIS) of right breast Right lumpectomy: Dr. 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 Adjuvant radiation therapy: Patient discussed with Dr. Sondra Come and decided against doing radiation. 2.Opted against anti estrogen therapy.  ____________________________________________________________________________________________ Maxillofacial CT scan 11/22/2019: Normal  Dr. Dorethea Clan is her psychiatrist and she wants [place] to get in touch with her to see if valproic acid can be discontinued and some other medication can be used instead.  Mammogram 10/26/2019: Benign breast density category D  Palpable concerns in the right breast: Given the category D breast density, I recommended that we obtain breast MRIs.  We will try to get one in the next 1 to 2 weeks. We will plan to alternate MRI and mammogram. Dr. Mariea Clonts has been the primary radiologist who has been counseling her.  If everything is normal then I will see her in 6 months.    Orders Placed This Encounter  Procedures  . MR BREAST BILATERAL W WO CONTRAST INC CAD    Standing Status:   Future    Standing Expiration Date:   06/21/2021    Order Specific Question:   If indicated for the ordered procedure, I authorize the administration of contrast media per Radiology protocol    Answer:   Yes    Order Specific Question:   What is the patient's sedation requirement?    Answer:   No Sedation    Order Specific Question:   Does the patient have a pacemaker or implanted devices?    Answer:   No    Order Specific Question:   Preferred imaging location?    Answer:   [facility] (table limit - 550 lbs)    Order Specific Question:   Release to patient     Answer:   Immediate   The patient has a good understanding of the overall plan. she agrees with it. she will call with any problems that may develop before the next visit here.  Total time spent: 20 mins including face to face time and time spent for planning, charting and coordination of care  Rulon Eisenmenger, MD, MPH 06/21/2020  I, Molly Dorshimer, am acting as scribe for Dr. Nicholas Lose.  I have reviewed the above documentation for accuracy and completeness, and I agree with the above.

## 2020-06-21 NOTE — Telephone Encounter (Signed)
Can you help me get this pt a Prior Auth started on getting her insurance to cover Retin A cream for her acne?

## 2020-06-21 NOTE — Telephone Encounter (Signed)
Patient aware that PA has been approved, [facility] is Agricultural consultant.

## 2020-06-21 NOTE — Telephone Encounter (Signed)
Thank you :)

## 2020-06-21 NOTE — Telephone Encounter (Signed)
Yes, this has been approved and I faxed the approval letter to patient's pharmacy on 06/18/20.  I will call and follow-up with the pharmacy this afternoon to make sure they processed it.

## 2020-06-29 ENCOUNTER — Ambulatory Visit (HOSPITAL_COMMUNITY)
Admission: RE | Admit: 2020-06-29 | Discharge: 2020-06-29 | Disposition: A | Discharge status: Home / Self Care | Payer: Medicare Other | Source: Ambulatory Visit | Attending: Hematology and Oncology | Admitting: Hematology and Oncology

## 2020-06-29 ENCOUNTER — Other Ambulatory Visit: Payer: Self-pay

## 2020-06-29 ENCOUNTER — Ambulatory Visit (HOSPITAL_COMMUNITY): Payer: Medicare Other

## 2020-06-29 DIAGNOSIS — N6011 Diffuse cystic mastopathy of right breast: Secondary | ICD-10-CM | POA: Diagnosis not present

## 2020-06-29 DIAGNOSIS — D0511 Intraductal carcinoma in situ of right breast: Secondary | ICD-10-CM | POA: Diagnosis not present

## 2020-06-29 MED ORDER — GADOBUTROL 1 MMOL/ML IV SOLN
6.0000 mL | Freq: Once | INTRAVENOUS | Status: AC | PRN
  Administered 2020-06-29: 6 mL via INTRAVENOUS

## 2020-06-30 ENCOUNTER — Encounter: Payer: Self-pay | Admitting: Hematology and Oncology

## 2020-07-02 ENCOUNTER — Other Ambulatory Visit: Payer: Self-pay | Admitting: Hematology and Oncology

## 2020-07-02 DIAGNOSIS — R9389 Abnormal findings on diagnostic imaging of other specified body structures: Secondary | ICD-10-CM

## 2020-07-02 LAB — ESTRADIOL, ULTRA SENS: Estradiol, Sensitive: <2.5 pg/mL

## 2020-07-04 NOTE — Progress Notes (Signed)
Patient Care Team: Elsie Stain, MD as PCP - General (Pulmonary Disease) Services, [facility] as Referring Physician Christen Butter, MD as Referring Physician (Gynecology) Melida Quitter, MD as Consulting Physician (Otolaryngology) Loretta Plume as Consulting Physician (Neurosurgery)  DIAGNOSIS:    ICD-10-CM   1. Ductal carcinoma in situ (DCIS) of right breast  D05.11     SUMMARY OF ONCOLOGIC HISTORY: Oncology History  Ductal carcinoma in situ (DCIS) of right breast  06/29/2019 Initial Diagnosis   Right breast biopsy: Fibrocystic changes, pseudoangiomatous stromal hyperplasia   07/21/2019 Surgery   Right lumpectomy: Dr. Lucia Gaskins: Intermediate grade DCIS 0.2 cm, 0.1 cm from anterior margin, LCIS, ER 95%, PR 30% Tis NX stage 0   07/21/2019 Cancer Staging   Staging form: Breast, AJCC 8th Edition - Pathologic stage from 07/21/2019: Stage 0 (pTis (DCIS), pN0, cM0, ER+, PR+) - Signed by Gardenia Phlegm, NP on 08/03/2019     CHIEF COMPLIANT: Follow-up of right breast DCIS  INTERVAL HISTORY: Catherine Olsen is a 50 y.o. with above-mentioned history of right breast DCIS who underwent a right lumpectomy, and has declined radiation and antiestrogen therapy. Breast MRI on 06/29/20 showed 5.6cm area of low-grade ductal enhancement in the upper right breast concerning for DCIS.She presents to the clinic todayfor follow-up. She did not plan to undergo any biopsies as was recommended by radiology.  ALLERGIES:  is allergic to other, prednisone, sulfamethoxazole, oxycodone, abilify [aripiprazole], celebrex [celecoxib], molds & smuts, penicillins, tylenol [acetaminophen], percocet [oxycodone-acetaminophen], sulfa antibiotics, and sulfites.  MEDICATIONS:  Current Outpatient Medications  Medication Sig Dispense Refill  . tamoxifen (NOLVADEX) 10 MG tablet Take 1 tablet (10 mg total) by mouth daily. 30 tablet 0  . albuterol (VENTOLIN HFA) 108 (90 Base) MCG/ACT inhaler Inhale 2 puffs  into the lungs every 6 (six) hours as needed for wheezing or shortness of breath. 8 g 3  . cetirizine (ZYRTEC) 10 MG tablet Take 1 tablet (10 mg total) by mouth daily. (Patient not taking: Reported on 06/20/2020) 30 tablet 5  . EPINEPHrine 0.3 mg/0.3 mL IJ SOAJ injection Inject 0.3 mg into the skin as needed for anaphylaxis. (Patient not taking: Reported on 06/20/2020) 1 each 0  . ibuprofen (ADVIL) 800 MG tablet Take 1 tablet (800 mg total) by mouth 3 (three) times daily. 90 tablet 1  . Melatonin 10 MG TABS Take 5 mg by mouth at bedtime.    . tretinoin (RETIN-A) 0.025 % cream Apply topically at bedtime. For acne 45 g 0   No current facility-administered medications for this visit.    PHYSICAL EXAMINATION: ECOG PERFORMANCE STATUS: 1 - Symptomatic but completely ambulatory  Vitals:   07/05/20 1001  BP: 120/83  Pulse: 71  Resp: 20  Temp: 98.1 F (36.7 C)  SpO2: 99%   Filed Weights   07/05/20 1001  Weight: 143 lb (64.9 kg)      LABORATORY DATA:  I have reviewed the data as listed CMP Latest Ref Rng & Units 06/21/2020 05/10/2020 03/22/2020  Glucose 70 - 99 mg/dL 75 71 77  BUN 6 - 20 mg/dL 11 17 17   Creatinine 0.44 - 1.00 mg/dL 0.77 0.74 0.75  Sodium 135 - 145 mmol/L 140 141 143  Potassium 3.5 - 5.1 mmol/L 3.9 4.7 4.3  Chloride 98 - 111 mmol/L 107 102 108  CO2 22 - 32 mmol/L 27 24 29   Calcium 8.9 - 10.3 mg/dL 9.1 9.3 8.7(L)  Total Protein 6.5 - 8.1 g/dL 7.0 - 6.6  Total Bilirubin 0.3 -  1.2 mg/dL 0.3 - 0.2(L)  Alkaline Phos 38 - 126 U/L 75 - 75  AST 15 - 41 U/L 24 - 29  ALT 0 - 44 U/L 37 - 47(H)    Lab Results  Component Value Date   WBC 7.9 06/21/2020   HGB 14.1 06/21/2020   HCT 41.1 06/21/2020   MCV 95.6 06/21/2020   PLT 283 06/21/2020   NEUTROABS 5.1 06/21/2020    ASSESSMENT & PLAN:  Ductal carcinoma in situ (DCIS) of right breast Right lumpectomy: Dr. Lucia Gaskins: Intermediate grade DCIS 0.2 cm, 0.1 cm from anterior margin, LCIS, ER 95%, PR 30%  Tis NX stage  0  Treatment plan: 1. Adjuvant radiation therapy: Patient discussed with Dr. Sondra Come and decided against doing radiation. 2.Opted against anti estrogen therapy.  ____________________________________________________________________________________________ Maxillofacial CT scan 11/22/2019: Normal  Dr. Dorethea Clan is her psychiatrist Mammogram 10/26/2019: Benign breast density category D Breast MRI 06/29/2020: Interval 5.6 x 3.6 x 2.9 cm area of low-grade ductal enhancement in the upper half of right breast.  MRI guided biopsies recommended. Radiology recommended biopsies but patient does not want to do any further biopsies. She is interested in taking antiestrogen therapy to see if we can reverse these changes.  Tamoxifen counseling:We discussed the risks and benefits of tamoxifen. These include but not limited to insomnia, hot flashes, mood changes, vaginal dryness, and weight gain. Although rare, serious side effects including endometrial cancer, risk of blood clots were also discussed. We strongly believe that the benefits far outweigh the risks. Patient understands these risks and consented to starting treatment. Planned treatment duration is 5 years.  We will start her on 10 mg of tamoxifen a day and I will see her back in a month to see if she tolerates it well. Our plan is to obtain another MRI in 6 months and follow-up to see whether her breast changes are improving.   No orders of the defined types were placed in this encounter.  The patient has a good understanding of the overall plan. she agrees with it. she will call with any problems that may develop before the next visit here.  Total time spent: 30 mins including face to face time and time spent for planning, charting and coordination of care  Rulon Eisenmenger, MD, MPH 07/05/2020  I, Molly Dorshimer, am acting as scribe for Dr. Nicholas Lose.  I have reviewed the above documentation for accuracy and completeness, and I agree  with the above.

## 2020-07-05 ENCOUNTER — Inpatient Hospital Stay (HOSPITAL_BASED_OUTPATIENT_CLINIC_OR_DEPARTMENT_OTHER): Payer: Medicare Other | Admitting: Hematology and Oncology

## 2020-07-05 ENCOUNTER — Other Ambulatory Visit: Payer: Self-pay | Admitting: Hematology and Oncology

## 2020-07-05 ENCOUNTER — Other Ambulatory Visit: Payer: Self-pay

## 2020-07-05 DIAGNOSIS — D0511 Intraductal carcinoma in situ of right breast: Secondary | ICD-10-CM | POA: Diagnosis not present

## 2020-07-05 MED ORDER — MELATONIN 10 MG PO TABS: 5.0000 mg | ORAL_TABLET | Freq: Every evening | ORAL | Status: DC

## 2020-07-05 MED ORDER — TAMOXIFEN CITRATE 10 MG PO TABS: 10.0000 mg | ORAL_TABLET | Freq: Every day | ORAL | 0 refills | Status: DC

## 2020-07-05 NOTE — Assessment & Plan Note (Signed)
Right lumpectomy: Dr. 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 Adjuvant radiation therapy: Patient discussed with Dr. Sondra Come and decided against doing radiation. 2.Opted against anti estrogen therapy.  ____________________________________________________________________________________________ Maxillofacial CT scan 11/22/2019: Normal  Dr. Dorethea Clan is her psychiatrist Mammogram 10/26/2019: Benign breast density category D Breast MRI 06/29/2020: Interval 5.6 x 3.6 x 2.9 cm area of low-grade ductal enhancement in the upper half of right breast.  MRI guided biopsies recommended.

## 2020-07-20 ENCOUNTER — Other Ambulatory Visit: Payer: Medicare Other

## 2020-07-26 ENCOUNTER — Encounter: Payer: Self-pay | Admitting: Hematology and Oncology

## 2020-07-27 ENCOUNTER — Telehealth: Payer: Self-pay | Admitting: Hematology and Oncology

## 2020-07-27 ENCOUNTER — Other Ambulatory Visit: Payer: Self-pay | Admitting: *Deleted

## 2020-07-27 MED ORDER — TAMOXIFEN CITRATE 10 MG PO TABS: ORAL_TABLET | ORAL | 0 refills | Status: DC

## 2020-07-27 NOTE — Telephone Encounter (Signed)
Scheduled appt per 4/22 sch msg. Pt aware.  

## 2020-08-01 ENCOUNTER — Inpatient Hospital Stay: Payer: Medicare Other | Admitting: Hematology and Oncology

## 2020-08-06 NOTE — Progress Notes (Signed)
Patient Care Team: Elsie Stain, MD as PCP - General (Pulmonary Disease) Services, [facility] as Referring Physician Christen Butter, MD as Referring Physician (Gynecology) Melida Quitter, MD as Consulting Physician (Otolaryngology) Loretta Plume as Consulting Physician (Neurosurgery)  DIAGNOSIS:    ICD-10-CM   1. Ductal carcinoma in situ (DCIS) of right breast  D05.11     SUMMARY OF ONCOLOGIC HISTORY: Oncology History  Ductal carcinoma in situ (DCIS) of right breast  06/29/2019 Initial Diagnosis   Right breast biopsy: Fibrocystic changes, pseudoangiomatous stromal hyperplasia   07/21/2019 Surgery   Right lumpectomy: Dr. Lucia Gaskins: Intermediate grade DCIS 0.2 cm, 0.1 cm from anterior margin, LCIS, ER 95%, PR 30% Tis NX stage 0   07/21/2019 Cancer Staging   Staging form: Breast, AJCC 8th Edition - Pathologic stage from 07/21/2019: Stage 0 (pTis (DCIS), pN0, cM0, ER+, PR+) - Signed by Gardenia Phlegm, NP on 08/03/2019   07/05/2020 -  Anti-estrogen oral therapy   10 mg of tamoxifen daily      CHIEF COMPLIANT: Follow-up of right breast DCIS on tamoxifen  INTERVAL HISTORY: Catherine Olsen is a 50 y.o. with above-mentioned history of right breast DCIS who underwent a right lumpectomy, and has declined radiation and antiestrogen therapy. Breast MRI on 06/29/20 showed 5.6cm area of low-grade ductal enhancement in the upper right breast concerning for DCIS, but patient declined biopsy and opted to begin antiestrogen therapy with tamoxifen.She presents to the clinic todayfor follow-up.  She is complaining of enlarged lymph node in the right axilla as well as possibility that she may have an infection of the breast.  ALLERGIES:  is allergic to other, prednisone, sulfamethoxazole, oxycodone, abilify [aripiprazole], celebrex [celecoxib], molds & smuts, penicillins, tylenol [acetaminophen], percocet [oxycodone-acetaminophen], sulfa antibiotics, and sulfites.  MEDICATIONS:   Current Outpatient Medications  Medication Sig Dispense Refill  . albuterol (VENTOLIN HFA) 108 (90 Base) MCG/ACT inhaler Inhale 2 puffs into the lungs every 6 (six) hours as needed for wheezing or shortness of breath. 8 g 3  . cetirizine (ZYRTEC) 10 MG tablet Take 1 tablet (10 mg total) by mouth daily. (Patient not taking: Reported on 06/20/2020) 30 tablet 5  . EPINEPHrine 0.3 mg/0.3 mL IJ SOAJ injection Inject 0.3 mg into the skin as needed for anaphylaxis. (Patient not taking: Reported on 06/20/2020) 1 each 0  . ibuprofen (ADVIL) 800 MG tablet Take 1 tablet (800 mg total) by mouth 3 (three) times daily. 90 tablet 1  . Melatonin 10 MG TABS Take 5 mg by mouth at bedtime.    . tamoxifen (NOLVADEX) 10 MG tablet TAKE 1 TABLET(10 MG) BY MOUTH DAILY 90 tablet 0  . tretinoin (RETIN-A) 0.025 % cream Apply topically at bedtime. For acne 45 g 0   No current facility-administered medications for this visit.    PHYSICAL EXAMINATION: ECOG PERFORMANCE STATUS: 1 - Symptomatic but completely ambulatory  Vitals:   08/07/20 0844  BP: 115/87  Pulse: 81  Resp: 18  Temp: 97.9 F (36.6 C)  SpO2: 99%   Filed Weights   08/07/20 0844  Weight: 139 lb 1.6 oz (63.1 kg)       LABORATORY DATA:  I have reviewed the data as listed CMP Latest Ref Rng & Units 06/21/2020 05/10/2020 03/22/2020  Glucose 70 - 99 mg/dL 75 71 77  BUN 6 - 20 mg/dL 11 17 17   Creatinine 0.44 - 1.00 mg/dL 0.77 0.74 0.75  Sodium 135 - 145 mmol/L 140 141 143  Potassium 3.5 - 5.1 mmol/L 3.9  4.7 4.3  Chloride 98 - 111 mmol/L 107 102 108  CO2 22 - 32 mmol/L 27 24 29   Calcium 8.9 - 10.3 mg/dL 9.1 9.3 8.7(L)  Total Protein 6.5 - 8.1 g/dL 7.0 - 6.6  Total Bilirubin 0.3 - 1.2 mg/dL 0.3 - 0.2(L)  Alkaline Phos 38 - 126 U/L 75 - 75  AST 15 - 41 U/L 24 - 29  ALT 0 - 44 U/L 37 - 47(H)    Lab Results  Component Value Date   WBC 7.9 06/21/2020   HGB 14.1 06/21/2020   HCT 41.1 06/21/2020   MCV 95.6 06/21/2020   PLT 283 06/21/2020    NEUTROABS 5.1 06/21/2020    ASSESSMENT & PLAN:  Ductal carcinoma in situ (DCIS) of right breast Right lumpectomy: Dr. 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 Adjuvant radiation therapy: Patient discussed with Dr. Sondra Come anddecided against doing radiation. 2.Opted against anti estrogen therapy.  ___________________________________________________________________________ Maxillofacial CT scan 11/22/2019: Normal Dr. Dorethea Clan is her psychiatrist  Breast Cancer Surveillance: Mammogram 10/26/2019: Benign breast density category D Breast MRI 06/29/2020: Interval 5.6 x 3.6 x 2.9 cm area of low-grade ductal enhancement in the upper half of right b1. reast.    07/06/20: Right Lumpectomy: 0.2 cm IG DCIS Margins Neg, ER /PR Positive  Tamoxifen Toxicities: Notes side effects of tamoxifen we will increase it to 20 mg daily  Enlarged right axillary lymph node with possible mastitis: I sent a prescription for antibiotics.  I strongly encouraged her to get a biopsy but she is not interested in it.  We would like to obtain an ultrasound of the axilla for further evaluation.  RTC in 3 months    No orders of the defined types were placed in this encounter.  The patient has a good understanding of the overall plan. she agrees with it. she will call with any problems that may develop before the next visit here.  Total time spent: 30 mins including face to face time and time spent for planning, charting and coordination of care  Rulon Eisenmenger, MD, MPH 08/07/2020  I, Cloyde Reams Dorshimer, am acting as scribe for Dr. Nicholas Lose.  I have reviewed the above documentation for accuracy and completeness, and I agree with the above.

## 2020-08-06 NOTE — Assessment & Plan Note (Signed)
Right lumpectomy: Dr. 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 Adjuvant radiation therapy: Patient discussed with Dr. Sondra Come anddecided against doing radiation. 2.Opted against anti estrogen therapy.  ___________________________________________________________________________ Maxillofacial CT scan 11/22/2019: Normal  Dr. Dorethea Clan is her psychiatrist  Breast Cancer Surveillance: Mammogram 10/26/2019: Benign breast density category D Breast MRI 06/29/2020: Interval 5.6 x 3.6 x 2.9 cm area of low-grade ductal enhancement in the upper half of right b1. reast.    07/06/20: Right Lumpectomy: 0.2 cm IG DCIS Margins Neg, ER /PR Positive  Tamoxifen Toxicities:  RTC in

## 2020-08-07 ENCOUNTER — Inpatient Hospital Stay: Payer: Medicare Other | Attending: Hematology and Oncology | Admitting: Hematology and Oncology

## 2020-08-07 ENCOUNTER — Other Ambulatory Visit: Payer: Self-pay

## 2020-08-07 DIAGNOSIS — Z79810 Long term (current) use of selective estrogen receptor modulators (SERMs): Secondary | ICD-10-CM | POA: Diagnosis not present

## 2020-08-07 DIAGNOSIS — Z923 Personal history of irradiation: Secondary | ICD-10-CM | POA: Diagnosis not present

## 2020-08-07 DIAGNOSIS — Z79899 Other long term (current) drug therapy: Secondary | ICD-10-CM | POA: Diagnosis not present

## 2020-08-07 DIAGNOSIS — D0511 Intraductal carcinoma in situ of right breast: Secondary | ICD-10-CM | POA: Insufficient documentation

## 2020-08-07 MED ORDER — CEPHALEXIN 500 MG PO CAPS: 500.0000 mg | ORAL_CAPSULE | Freq: Two times a day (BID) | ORAL | 0 refills | Status: AC

## 2020-08-07 MED ORDER — TAMOXIFEN CITRATE 20 MG PO TABS: ORAL_TABLET | ORAL | 3 refills | Status: DC

## 2020-08-09 ENCOUNTER — Other Ambulatory Visit: Payer: Self-pay | Admitting: Hematology and Oncology

## 2020-08-09 ENCOUNTER — Ambulatory Visit
Admission: RE | Admit: 2020-08-09 | Discharge: 2020-08-09 | Disposition: A | Discharge status: Home / Self Care | Payer: Medicare Other | Source: Ambulatory Visit | Attending: Hematology and Oncology | Admitting: Hematology and Oncology

## 2020-08-09 ENCOUNTER — Other Ambulatory Visit: Payer: Self-pay

## 2020-08-09 DIAGNOSIS — D0511 Intraductal carcinoma in situ of right breast: Secondary | ICD-10-CM

## 2020-08-09 DIAGNOSIS — C773 Secondary and unspecified malignant neoplasm of axilla and upper limb lymph nodes: Secondary | ICD-10-CM | POA: Diagnosis not present

## 2020-08-09 DIAGNOSIS — N6489 Other specified disorders of breast: Secondary | ICD-10-CM | POA: Diagnosis not present

## 2020-08-11 ENCOUNTER — Ambulatory Visit (HOSPITAL_COMMUNITY)
Admission: EM | Admit: 2020-08-11 | Discharge: 2020-08-12 | Disposition: A | Discharge status: Other Acute Inpatient Hospital | Payer: Medicare Other | Attending: Urology | Admitting: Urology

## 2020-08-11 ENCOUNTER — Other Ambulatory Visit: Payer: Self-pay

## 2020-08-11 DIAGNOSIS — R45851 Suicidal ideations: Secondary | ICD-10-CM | POA: Insufficient documentation

## 2020-08-11 DIAGNOSIS — F4310 Post-traumatic stress disorder, unspecified: Secondary | ICD-10-CM

## 2020-08-11 DIAGNOSIS — H6982 Other specified disorders of Eustachian tube, left ear: Secondary | ICD-10-CM | POA: Diagnosis not present

## 2020-08-11 DIAGNOSIS — C50919 Malignant neoplasm of unspecified site of unspecified female breast: Secondary | ICD-10-CM | POA: Insufficient documentation

## 2020-08-11 DIAGNOSIS — F314 Bipolar disorder, current episode depressed, severe, without psychotic features: Secondary | ICD-10-CM | POA: Insufficient documentation

## 2020-08-11 DIAGNOSIS — Z79899 Other long term (current) drug therapy: Secondary | ICD-10-CM | POA: Insufficient documentation

## 2020-08-11 DIAGNOSIS — Z20822 Contact with and (suspected) exposure to covid-19: Secondary | ICD-10-CM | POA: Insufficient documentation

## 2020-08-11 DIAGNOSIS — F17210 Nicotine dependence, cigarettes, uncomplicated: Secondary | ICD-10-CM | POA: Insufficient documentation

## 2020-08-11 MED ORDER — TAMOXIFEN CITRATE 10 MG PO TABS: 20.0000 mg | ORAL_TABLET | Freq: Every day | ORAL | Status: DC

## 2020-08-11 MED ORDER — IBUPROFEN 600 MG PO TABS
600.0000 mg | ORAL_TABLET | Freq: Three times a day (TID) | ORAL | Status: DC | PRN
  Filled 2020-08-11: qty 1

## 2020-08-11 MED ORDER — IBUPROFEN 600 MG PO TABS: 600.0000 mg | ORAL_TABLET | Freq: Two times a day (BID) | ORAL | Status: DC | PRN

## 2020-08-11 MED ORDER — CEPHALEXIN 250 MG PO CAPS
500.0000 mg | ORAL_CAPSULE | Freq: Two times a day (BID) | ORAL | Status: DC
  Filled 2020-08-11: qty 2

## 2020-08-11 NOTE — ED Provider Notes (Signed)
Behavioral Health Admission H&P [facility] & OBS)  Date: 08/11/20 Patient Name: Catherine Olsen MRN: [id number] Chief Complaint:  Chief Complaint  Patient presents with  . urgent emergent eval   Chief Complaint/Presenting Problem: Pt states she is "having a nervous breakdown" and feels anxious and depressed due to mulitple stressors. She reports current suicidal ideation with plan to hang herself.  Diagnoses:  Final diagnoses:  None    HPI: Catherine Olsen is 49y/o female with psychiatric history of Bipolar 1 disorder, suicidal attempt, anxiety, PTSD and cocaine abuse. Patient presented to [facility] voluntarily with chief complaint of suicidal ideation with plan to hang herself. Patient states "I am on a  verge of a breakdown and I need to be admitted into the hospital for help." She report that she is feeling "very anxious and depressed" due to "financial difficulty, breast cancer diagnosis, mother's health, and not taking any mental health medications." Patient report that she stopped taking medication for her psychiatric diagnosis due to their potential of worsening breast cancer. She report "my cancer doctor and I discovered that these medication affect my prolactin levels. The medications were causing inflammation in my breast so I stopped taking them." she report that last took Depakote in Dec. 2021 and that she took Seroquel last in March 2022. She also report that she weaned herself off benzos due to "falling a lot and it cause me to have cognitive damage."  Patient endorses SI with plan to hang herself; she denies HI and AVH. She endorses feeling paranoid and states "I feel like the police are watching me." she admits to using cocaine, last use was ~1 month ago. She denies alcohol and other substance abuse. She resides at home with her mother.   Patient was assessed by this NP. Patient is alert and oriented, her speech is clear and coherent, she maintained good eye contact, patient's mood is anxious, her  affect is congruent with mood. She denies respiratory distress, SOB, chest pain, dizziness, GI/GU symptoms, or headache.   PHQ 2-9:  [place] ED from 12/09/2019 in [facility] Office Visit from 12/01/2018 in Primary Care at [facility] Visit from 01/20/2018 in Primary Care at [place] that you would be better off dead, or of hurting yourself in some way Nearly every day Not at all Not at all  PHQ-9 Total Score 22 9 --      Flowsheet Row ED from 08/11/2020 in [facility] ED from 12/15/2019 in [place] DEPT ED from 12/14/2019 in [place] High Risk Error: Question 6 not populated High Risk       Total Time spent with patient: 30 minutes  Musculoskeletal  Strength & Muscle Tone: within normal limits Gait & Station: normal Patient leans: Right  Psychiatric Specialty Exam  Presentation General Appearance: Appropriate for Environment  Eye Contact:Good  Speech:Clear and Coherent  Speech Volume:Normal  Handedness:Right   Mood and Affect  Mood:Anxious  Affect:Congruent   Thought Process  Thought Processes:Coherent; Goal Directed  Descriptions of Associations:Intact  Orientation:Full (Time, Place and Person)  Thought Content:WDL  Diagnosis of Schizophrenia or Schizoaffective disorder in past: No   Hallucinations:Hallucinations: None  Ideas of Reference:None  Suicidal Thoughts:Suicidal Thoughts: Yes, Active SI Active Intent and/or Plan: With Plan  Homicidal Thoughts:Homicidal Thoughts: No   Sensorium  Memory:Immediate Good; Recent Good; Remote Good  Judgment:Fair  Insight:Good   Executive Functions  Concentration:Good  Attention Span:Good  Recall:Good  Fund of Knowledge:Good  Language:Good   Psychomotor Activity  Psychomotor Activity:Psychomotor Activity: Normal   Assets   Assets:Communication Skills; Desire for Improvement; Housing; Social Support   Sleep  Sleep:Sleep: Fair   Nutritional Assessment (For OBS and [facility] admissions only) Has the patient had a weight loss or gain of 10 pounds or more in the last 3 months?: Yes Has the patient had a decrease in food intake/or appetite?: Yes Does the patient have dental problems?: No Does the patient have eating habits or behaviors that may be indicators of an eating disorder including binging or inducing vomiting?: No Has the patient recently lost weight without trying?: Yes, 2-13 lbs. Has the patient been eating poorly because of a decreased appetite?: No Malnutrition Screening Tool Score: 1    Physical Exam Constitutional:      General: She is not in acute distress. HENT:     Nose: Nose normal.  Cardiovascular:     Rate and Rhythm: Normal rate.  Pulmonary:     Effort: Pulmonary effort is normal.  Skin:    General: Skin is dry.  Neurological:     Mental Status: She is oriented to person, place, and time.    Review of Systems  Constitutional: Negative for chills, fever and weight loss.  Respiratory: Negative for cough and hemoptysis.   Cardiovascular: Negative for chest pain, palpitations and orthopnea.  Musculoskeletal: Positive for joint pain.  Skin: Negative for itching and rash.  Neurological: Positive for tingling. Negative for dizziness.  Psychiatric/Behavioral: Positive for depression, memory loss, substance abuse and suicidal ideas. Negative for hallucinations. The patient is nervous/anxious and has insomnia.     Blood pressure (!) 121/92, pulse 80, temperature (!) 97.2 F (36.2 C), temperature source Tympanic, resp. rate 18, last menstrual period 11/21/2014, SpO2 99 %. There is no height or weight on file to calculate BMI.  Past Psychiatric History: suicidal attempt, Bipolar 1 disorder, anxiety, subtance abuse   Is the patient at risk to self? Yes  Has the patient been a risk to  self in the past 6 months? Yes .    Has the patient been a risk to self within the distant past? Yes   Is the patient a risk to others? No   Has the patient been a risk to others in the past 6 months? No   Has the patient been a risk to others within the distant past? No   Past Medical History:  Past Medical History:  Diagnosis Date  . Abnormal uterine bleeding (AUB) 06/25/2009   Qualifier: Diagnosis of  By: Cathren Laine MD, Ankit    . Allergy   . Angio-edema   . Anxiety   . Arthritis    hands, lower back, knees  . Asthma   . Bipolar 1 disorder ([place])   . Breast cancer (HCC)    stage 0  . Chronic prescription benzodiazepine use 09/08/2018  . COPD (chronic obstructive pulmonary disease) ([place])   . Depression   . Endometriosis   . Fibromyalgia   . GERD (gastroesophageal reflux disease)   . Headache(784.0)    otc med prn  . Migraine   . Pituitary tumor    microadenoma  . PONV (postoperative nausea and vomiting)   . PTSD (post-traumatic stress disorder)   . Scoliosis   . Termination of pregnancy    x 2 at age 48 and 50 yrs old  . Tobacco abuse 03/04/2015  . Urticaria     Past Surgical  History:  Procedure Laterality Date  . BREAST BIOPSY Right 05/19/2007  . BREAST BIOPSY Right 06/02/2007  . BREAST BIOPSY  06/29/2019  . BREAST EXCISIONAL BIOPSY    . BREAST LUMPECTOMY Right 07/21/2019  . BREAST LUMPECTOMY WITH RADIOACTIVE SEED LOCALIZATION Right 07/21/2019   Procedure: RIGHT BREAST LUMPECTOMY WITH RADIOACTIVE SEED LOCALIZATION;  Surgeon: Alphonsa Overall, MD;  Location: [place];  Service: General;  Laterality: Right;  . BREAST SURGERY    . DILATION AND CURETTAGE OF UTERUS    . FACIAL COSMETIC SURGERY     right cheek  . LAPAROSCOPY Right 11/11/2013   Procedure: LAPAROSCOPY OPERATIVE with  Drainage of  RIGHT Ovarian ENDOMETRIOMA;  Surgeon: Elveria Royals, MD;  Location: [facility];  Service: Gynecology;  Laterality: Right;  . NASAL ENDOSCOPY     said it showed some acid  reflux from ENT  . NOSE SURGERY     rhinoplasty at age 25 yrs  . WISDOM TOOTH EXTRACTION      Family History:  Family History  Problem Relation Age of Onset  . Diabetes Mother   . Hypertension Mother   . Stroke Mother 41       CVA  . Mental illness Mother        no diagnosis; personality disorder  . Hyperlipidemia Father   . Hypertension Father   . COPD Father   . Alpha-1 antitrypsin deficiency Father   . Asthma Father   . Alpha-1 antitrypsin deficiency Brother   . Asthma Brother   . Diabetes Maternal Grandmother   . Heart disease Maternal Grandmother   . Hyperlipidemia Maternal Grandmother   . Hypertension Maternal Grandmother   . Mental illness Maternal Grandmother   . Heart disease Maternal Grandfather   . Hyperlipidemia Maternal Grandfather   . Hypertension Maternal Grandfather   . Asthma Maternal Grandfather   . Heart disease Paternal Grandfather   . Hyperlipidemia Paternal Grandfather   . Hypertension Paternal Grandfather   . Stroke Paternal Grandfather   . Alzheimer's disease Paternal Grandmother   . Allergic rhinitis Neg Hx   . Angioedema Neg Hx   . Eczema Neg Hx   . Urticaria Neg Hx   . Immunodeficiency Neg Hx   . Colon cancer Neg Hx   . Esophageal cancer Neg Hx     Social History:  Social History   Socioeconomic History  . Marital status: Divorced    Spouse name: Not on file  . Number of children: 0  . Years of education: Not on file  . Highest education level: Bachelor's degree (e.g., BA, AB, BS)  Occupational History  . Occupation: disability    Comment: mental illness  Tobacco Use  . Smoking status: Current Every Day Smoker    Packs/day: 0.50    Years: 29.00    Pack years: 14.50    Types: Cigarettes    Last attempt to quit: 10/08/2019    Years since quitting: 0.8  . Smokeless tobacco: Never Used  Vaping Use  . Vaping Use: Former  Substance and Sexual Activity  . Alcohol use: Yes    Alcohol/week: 2.0 standard drinks    Types: 2 Cans of  beer per week    Comment: 6 nights  . Drug use: Not Currently    Types: "Crack" cocaine    Comment: 07/19/2019  . Sexual activity: Not Currently    Comment: abortion at 44 and 64yr. of age   Other Topics Concern  . Not on file  Social History  Narrative   Marital status: divorced; not dating      Children: none      Lives: alone with mom      Employment: disability for mental illness in 1997.  Hospitalizations x 9 in past.      Tobacco: 1 ppd x since age 57. Decreasing in 2018.      Alcohol: socially; beers.      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      Exercise: yoga daily; exercise daily.      ADLs: independent with ADLs; no car; depends on others for transportation.      Patient is right-handed. She is divorced and lives with her mother. She drinks 2-3 cups of 1/2 caffeine coffe a day. She walks occasionally for exercise.   Social Determinants of Health   Financial Resource Strain: Not on file  Food Insecurity: Not on file  Transportation Needs: Not on file  Physical Activity: Not on file  Stress: Not on file  Social Connections: Not on file  Intimate Partner Violence: Not on file    SDOH:  SDOH Screenings   Alcohol Screen: Not on file  Depression (PHQ2-9): Medium Risk  . PHQ-2 Score: 22  Financial Resource Strain: Not on file  Food Insecurity: Not on file  Housing: Not on file  Physical Activity: Not on file  Social Connections: Not on file  Stress: Not on file  Tobacco Use: High Risk  . Smoking Tobacco Use: Current Every Day Smoker  . Smokeless Tobacco Use: Never Used  Transportation Needs: Not on file    Last Labs:  Appointment on 06/21/2020  Component Date Value Ref Range Status  . Sodium 06/21/2020 140  135 - 145 mmol/L Final  . Potassium 06/21/2020 3.9  3.5 - 5.1 mmol/L Final  . Chloride 06/21/2020 107  98 - 111 mmol/L Final  . CO2 06/21/2020 27  22 - 32 mmol/L Final  . Glucose, Bld 06/21/2020 75  70 - 99 mg/dL Final    Glucose reference range applies only to samples taken after fasting for at least 8 hours.  . BUN 06/21/2020 11  6 - 20 mg/dL Final  . Creatinine 06/21/2020 0.77  0.44 - 1.00 mg/dL Final  . Calcium 06/21/2020 9.1  8.9 - 10.3 mg/dL Final  . Total Protein 06/21/2020 7.0  6.5 - 8.1 g/dL Final  . Albumin 06/21/2020 4.1  3.5 - 5.0 g/dL Final  . AST 06/21/2020 24  15 - 41 U/L Final  . ALT 06/21/2020 37  0 - 44 U/L Final  . Alkaline Phosphatase 06/21/2020 75  38 - 126 U/L Final  . Total Bilirubin 06/21/2020 0.3  0.3 - 1.2 mg/dL Final  . GFR, Estimated 06/21/2020 >60  >60 mL/min Final   Comment: (NOTE) Calculated using the CKD-EPI Creatinine Equation (2021)   . Anion gap 06/21/2020 6  5 - 15 Final   Performed at [facility], [place] [address] [place], [place] [address]  . WBC Count 06/21/2020 7.9  4.0 - 10.5 K/uL Final  . RBC 06/21/2020 4.30  3.87 - 5.11 MIL/uL Final  . Hemoglobin 06/21/2020 14.1  12.0 - 15.0 g/dL Final  . HCT 06/21/2020 41.1  36.0 - 46.0 % Final  . MCV 06/21/2020 95.6  80.0 - 100.0 fL Final  . MCH 06/21/2020 32.8  26.0 - 34.0 pg Final  . MCHC 06/21/2020 34.3  30.0 - 36.0 g/dL Final  . RDW 06/21/2020 13.4  11.5 -  15.5 % Final  . Platelet Count 06/21/2020 283  150 - 400 K/uL Final  . nRBC 06/21/2020 0.0  0.0 - 0.2 % Final  . Neutrophils Relative % 06/21/2020 63  % Final  . Neutro Abs 06/21/2020 5.1  1.7 - 7.7 K/uL Final  . Lymphocytes Relative 06/21/2020 23  % Final  . Lymphs Abs 06/21/2020 1.8  0.7 - 4.0 K/uL Final  . Monocytes Relative 06/21/2020 6  % Final  . Monocytes Absolute 06/21/2020 0.5  0.1 - 1.0 K/uL Final  . Eosinophils Relative 06/21/2020 7  % Final  . Eosinophils Absolute 06/21/2020 0.5  0.0 - 0.5 K/uL Final  . Basophils Relative 06/21/2020 1  % Final  . Basophils Absolute 06/21/2020 0.0  0.0 - 0.1 K/uL Final  . Immature Granulocytes 06/21/2020 0  % Final  . Abs Immature Granulocytes 06/21/2020 0.01  0.00 - 0.07 K/uL Final   Performed  at [facility], [place] [address], [place], [place] [address]  . Estradiol, Sensitive 06/21/2020 <2.5  pg/mL Final   Comment: (NOTE)           Female:            Follicular:                    30.0 - 100.0            Luteal:                        70.0 - 300.0            Postmenopausal:                      < 15.0 This test was developed and its performance characteristics determined by [facility]. It has not been cleared by the Food and Drug Administration. Methodology: Liquid chromatography tandem mass spectrometry(LC/MS/MS) Performed At: [facility] [employer] [place], [place] [id number] Rush Farmer MD TD:[id number]   Office Visit on 06/20/2020  Component Date Value Ref Range Status  . Prolactin 06/20/2020 12.0  4.8 - 23.3 ng/mL Final  . hCG,Beta Subunit,Qual,Serum 06/20/2020 Negative  Negative <6 mIU/mL Final  Office Visit on 05/22/2020  Component Date Value Ref Range Status  . C1INH SerPl-mCnc 05/22/2020 30  21 - 39 mg/dL Final  . C1INH Functional/C1INH Total MFr S* 05/22/2020 94  %mean normal Final   Comment:                                   Abnormal       <41                                   Equivocal  41 - 67                                   Normal         >67   . Complement C1Q 05/22/2020 11.5  10.3 - 20.5 mg/dL Final  . Tryptase 05/22/2020 9.8  2.2 - 13.2 ug/L Final  . Complement C3, Serum 05/22/2020 87  82 - 167 mg/dL Final  . Complement C4, Serum 05/22/2020 15  12 - 38 mg/dL Final  . F279-IgE Chili Pepper  05/22/2020 <0.10  Class 0 kU/L Final  . F096-IgE Avocado 05/22/2020 <0.10  Class 0 kU/L Final   Comment:     Levels of Specific IgE       Class  Description of Class     ---------------------------  -----  --------------------                    < 0.10         0         Negative            0.10 -    0.31         0/I       Equivocal/Low            0.32 -    0.55         I         Low            0.56 -    1.40         II         Moderate            1.41 -    3.90         III       High            3.91 -   19.00         IV        Very High           19.01 -  100.00         V         Very High                   >100.00         VI        Very High   . Allergen Ginger IgE 05/22/2020 <0.10  Class 0 kU/L Final  . F317-IgE Coriander/Cilantro 05/22/2020 <0.10  Class 0 kU/L Final  . F265-IgE Cumin 05/22/2020 <0.10  Class 0 kU/L Final  . Turmeric IgE* 05/22/2020 <0.35  <0.35 kU/L Final  . Class Interpretation 05/22/2020 0   Final   Comment: This conventional EIA uses allergen-coated discs from several suppliers and an enzyme-labeled anti-IgE. CLASS INTERPRETATION: <0.35 kU/L=0, Below Detection; 0.35-0.69 kU/L= 1, Low Positive; 0.70-3.49 kU/L= 2, Moderate Positive; 3.50-17.49 kU/L= 3, Positive; 17.50-49.99 kU/L= 4, Strong Positive; 50.00-99.99 kU/L= 5, Very Strong Positive; >99.99 kU/L= 6, Very Strong Positive *This test was developed and its performance characteristics determined by [facility]. It has not been cleared or approved by the U.S. Food and Drug Administration.   . Class Description Allergens 05/22/2020 Comment   Final   Comment:     Levels of Specific IgE       Class  Description of Class     ---------------------------  -----  --------------------                    < 0.10         0         Negative            0.10 -    0.31         0/I       Equivocal/Low            0.32 -    0.55  I         Low            0.56 -    1.40         II        Moderate            1.41 -    3.90         III       High            3.91 -   19.00         IV        Very High           19.01 -  100.00         V         Very High                   >100.00         VI        Very High   . IgE (Immunoglobulin E), Serum 05/22/2020 2* 6 - 495 IU/mL Final  . O215-IgE Alpha-Gal 05/22/2020 <0.10  Class 0 kU/L Final  . Beef IgE 05/22/2020 <0.10  Class 0 kU/L Final  . Pork IgE 05/22/2020 <0.10  Class 0 kU/L Final  . Allergen  Lamb IgE 05/22/2020 <0.10  Class 0 kU/L Final  Office Visit on 05/10/2020  Component Date Value Ref Range Status  . Cholesterol, Total 05/10/2020 181  100 - 199 mg/dL Final  . Triglycerides 05/10/2020 165* 0 - 149 mg/dL Final  . HDL 05/10/2020 70  >39 mg/dL Final  . VLDL Cholesterol Cal 05/10/2020 28  5 - 40 mg/dL Final  . LDL Chol Calc (NIH) 05/10/2020 83  0 - 99 mg/dL Final  . Chol/HDL Ratio 05/10/2020 2.6  0.0 - 4.4 ratio Final   Comment:                                   T. Chol/HDL Ratio                                             Men  Women                               1/2 Avg.Risk  3.4    3.3                                   Avg.Risk  5.0    4.4                                2X Avg.Risk  9.6    7.1                                3X Avg.Risk 23.4   11.0   . Glucose 05/10/2020 71  65 - 99 mg/dL Final  . BUN 05/10/2020 17  6 - 24 mg/dL Final  . Creatinine, Ser 05/10/2020 0.74  0.57 - 1.00 mg/dL Final  .  GFR calc non Af Amer 05/10/2020 96  >59 mL/min/1.73 Final  . GFR calc Af Amer 05/10/2020 111  >59 mL/min/1.73 Final   Comment: **In accordance with recommendations from the NKF-ASN Task force,**   [facility] is in the process of updating its eGFR calculation to the   2021 CKD-EPI creatinine equation that estimates kidney function   without a race variable.   . BUN/Creatinine Ratio 05/10/2020 23  9 - 23 Final  . Sodium 05/10/2020 141  134 - 144 mmol/L Final  . Potassium 05/10/2020 4.7  3.5 - 5.2 mmol/L Final  . Chloride 05/10/2020 102  96 - 106 mmol/L Final  . CO2 05/10/2020 24  20 - 29 mmol/L Final  . Calcium 05/10/2020 9.3  8.7 - 10.2 mg/dL Final  Appointment on 03/22/2020  Component Date Value Ref Range Status  . WBC Count 03/22/2020 6.5  4.0 - 10.5 K/uL Final  . RBC 03/22/2020 4.26  3.87 - 5.11 MIL/uL Final  . Hemoglobin 03/22/2020 14.0  12.0 - 15.0 g/dL Final  . HCT 03/22/2020 40.9  36.0 - 46.0 % Final  . MCV 03/22/2020 96.0  80.0 - 100.0 fL Final  . MCH 03/22/2020 32.9   26.0 - 34.0 pg Final  . MCHC 03/22/2020 34.2  30.0 - 36.0 g/dL Final  . RDW 03/22/2020 13.7  11.5 - 15.5 % Final  . Platelet Count 03/22/2020 240  150 - 400 K/uL Final  . nRBC 03/22/2020 0.0  0.0 - 0.2 % Final  . Neutrophils Relative % 03/22/2020 44  % Final  . Neutro Abs 03/22/2020 2.9  1.7 - 7.7 K/uL Final  . Lymphocytes Relative 03/22/2020 37  % Final  . Lymphs Abs 03/22/2020 2.4  0.7 - 4.0 K/uL Final  . Monocytes Relative 03/22/2020 5  % Final  . Monocytes Absolute 03/22/2020 0.3  0.1 - 1.0 K/uL Final  . Eosinophils Relative 03/22/2020 13  % Final  . Eosinophils Absolute 03/22/2020 0.8* 0.0 - 0.5 K/uL Final  . Basophils Relative 03/22/2020 1  % Final  . Basophils Absolute 03/22/2020 0.0  0.0 - 0.1 K/uL Final  . Immature Granulocytes 03/22/2020 0  % Final  . Abs Immature Granulocytes 03/22/2020 0.01  0.00 - 0.07 K/uL Final   Performed at [facility], [place] [address] [place], [place] [address]  . Sodium 03/22/2020 143  135 - 145 mmol/L Final  . Potassium 03/22/2020 4.3  3.5 - 5.1 mmol/L Final  . Chloride 03/22/2020 108  98 - 111 mmol/L Final  . CO2 03/22/2020 29  22 - 32 mmol/L Final  . Glucose, Bld 03/22/2020 77  70 - 99 mg/dL Final   Glucose reference range applies only to samples taken after fasting for at least 8 hours.  . BUN 03/22/2020 17  6 - 20 mg/dL Final  . Creatinine 03/22/2020 0.75  0.44 - 1.00 mg/dL Final  . Calcium 03/22/2020 8.7* 8.9 - 10.3 mg/dL Final  . Total Protein 03/22/2020 6.6  6.5 - 8.1 g/dL Final  . Albumin 03/22/2020 3.7  3.5 - 5.0 g/dL Final  . AST 03/22/2020 29  15 - 41 U/L Final  . ALT 03/22/2020 47* 0 - 44 U/L Final  . Alkaline Phosphatase 03/22/2020 75  38 - 126 U/L Final  . Total Bilirubin 03/22/2020 0.2* 0.3 - 1.2 mg/dL Final  . GFR, Estimated 03/22/2020 >60  >60 mL/min Final   Comment: (NOTE) Calculated using the CKD-EPI Creatinine Equation (2021)   . Anion gap 03/22/2020 6  5 - 15 Final   Performed at [facility], [place] [address] [place], [place] [address]  . Estradiol, Sensitive 03/22/2020 <2.5  pg/mL Final   Comment: (NOTE)           Female:            Follicular:                    30.0 - 100.0            Luteal:                        70.0 - 300.0            Postmenopausal:                      < 15.0 This test was developed and its performance characteristics determined by [facility]. It has not been cleared by the Food and Drug Administration. Methodology: Liquid chromatography tandem mass spectrometry(LC/MS/MS) Performed At: [facility] [employer] [place], [place] [id number] Rush Farmer MD WN:[id number]     Allergies: Other, Prednisone, Sulfamethoxazole, Oxycodone, Abilify [aripiprazole], Celebrex [celecoxib], Molds & smuts, Penicillins, Tylenol [acetaminophen], Percocet [oxycodone-acetaminophen], Sulfa antibiotics, and Sulfites  PTA Medications: (Not in a hospital admission)   Medical Decision Making  Patient meet criteria for inpatient psychiatric treatment. Admit patient to [facility] for continuous assessment and stabilization while waiting for inpatient psychiatric bed to become available.  -Obtain labs  -continue home medications -continue antibiotic for possible mastitis for 2 weeks per Dr. Geralyn Flash note on 08/07/20     Recommendations  Based on my evaluation the patient does not appear to have an emergency medical condition.  Ophelia Shoulder, NP 08/11/20  11:40 PM
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
## 2020-08-12 ENCOUNTER — Encounter (HOSPITAL_COMMUNITY): Payer: Self-pay | Admitting: Urology

## 2020-08-12 ENCOUNTER — Inpatient Hospital Stay (HOSPITAL_COMMUNITY)
Admission: AD | Admit: 2020-08-12 | Discharge: 2020-08-12 | DRG: 885 | Disposition: A | Discharge status: Home / Self Care | Payer: Medicare Other | Source: Ambulatory Visit | Attending: Psychiatry | Admitting: Psychiatry

## 2020-08-12 DIAGNOSIS — Z833 Family history of diabetes mellitus: Secondary | ICD-10-CM

## 2020-08-12 DIAGNOSIS — Z820 Family history of epilepsy and other diseases of the nervous system: Secondary | ICD-10-CM

## 2020-08-12 DIAGNOSIS — F17210 Nicotine dependence, cigarettes, uncomplicated: Secondary | ICD-10-CM | POA: Diagnosis not present

## 2020-08-12 DIAGNOSIS — Z8249 Family history of ischemic heart disease and other diseases of the circulatory system: Secondary | ICD-10-CM | POA: Diagnosis not present

## 2020-08-12 DIAGNOSIS — J431 Panlobular emphysema: Secondary | ICD-10-CM | POA: Diagnosis present

## 2020-08-12 DIAGNOSIS — F314 Bipolar disorder, current episode depressed, severe, without psychotic features: Principal | ICD-10-CM | POA: Diagnosis present

## 2020-08-12 DIAGNOSIS — Z825 Family history of asthma and other chronic lower respiratory diseases: Secondary | ICD-10-CM | POA: Diagnosis not present

## 2020-08-12 DIAGNOSIS — Z853 Personal history of malignant neoplasm of breast: Secondary | ICD-10-CM

## 2020-08-12 DIAGNOSIS — Z79810 Long term (current) use of selective estrogen receptor modulators (SERMs): Secondary | ICD-10-CM

## 2020-08-12 DIAGNOSIS — Z823 Family history of stroke: Secondary | ICD-10-CM | POA: Diagnosis not present

## 2020-08-12 DIAGNOSIS — Z9119 Patient's noncompliance with other medical treatment and regimen: Secondary | ICD-10-CM

## 2020-08-12 LAB — CBC WITH DIFFERENTIAL/PLATELET
Abs Immature Granulocytes: 0.01 10*3/uL (ref 0.00–0.07)
Basophils Absolute: 0.1 10*3/uL (ref 0.0–0.1)
Basophils Relative: 1 %
Eosinophils Absolute: 0.6 10*3/uL — ABNORMAL HIGH (ref 0.0–0.5)
Eosinophils Relative: 9 %
HCT: 44.8 % (ref 36.0–46.0)
Hemoglobin: 14.9 g/dL (ref 12.0–15.0)
Immature Granulocytes: 0 %
Lymphocytes Relative: 32 %
Lymphs Abs: 2.3 10*3/uL (ref 0.7–4.0)
MCH: 32.3 pg (ref 26.0–34.0)
MCHC: 33.3 g/dL (ref 30.0–36.0)
MCV: 97 fL (ref 80.0–100.0)
Monocytes Absolute: 0.3 10*3/uL (ref 0.1–1.0)
Monocytes Relative: 5 %
Neutro Abs: 3.7 10*3/uL (ref 1.7–7.7)
Neutrophils Relative %: 53 %
Platelets: 243 10*3/uL (ref 150–400)
RBC: 4.62 MIL/uL (ref 3.87–5.11)
RDW: 13.7 % (ref 11.5–15.5)
WBC: 7 10*3/uL (ref 4.0–10.5)
nRBC: 0 % (ref 0.0–0.2)

## 2020-08-12 LAB — RESP PANEL BY RT-PCR (FLU A&B, COVID) ARPGX2
Influenza A by PCR: NEGATIVE
Influenza B by PCR: NEGATIVE
SARS Coronavirus 2 by RT PCR: NEGATIVE

## 2020-08-12 LAB — RAPID URINE DRUG SCREEN, HOSP PERFORMED
Amphetamines: NOT DETECTED
Barbiturates: NOT DETECTED
Benzodiazepines: NOT DETECTED
Cocaine: NOT DETECTED
Opiates: NOT DETECTED
Tetrahydrocannabinol: NOT DETECTED

## 2020-08-12 LAB — COMPREHENSIVE METABOLIC PANEL
ALT: 14 U/L (ref 0–44)
AST: 15 U/L (ref 15–41)
Albumin: 4.3 g/dL (ref 3.5–5.0)
Alkaline Phosphatase: 71 U/L (ref 38–126)
Anion gap: 6 (ref 5–15)
BUN: 8 mg/dL (ref 6–20)
CO2: 25 mmol/L (ref 22–32)
Calcium: 9.5 mg/dL (ref 8.9–10.3)
Chloride: 108 mmol/L (ref 98–111)
Creatinine, Ser: 0.74 mg/dL (ref 0.44–1.00)
GFR, Estimated: >60 mL/min (ref >60)
Glucose, Bld: 89 mg/dL (ref 70–99)
Potassium: 3.7 mmol/L (ref 3.5–5.1)
Sodium: 139 mmol/L (ref 135–145)
Total Bilirubin: 0.4 mg/dL (ref 0.3–1.2)
Total Protein: 6.7 g/dL (ref 6.5–8.1)

## 2020-08-12 LAB — HEMOGLOBIN A1C
Hgb A1c MFr Bld: 5.3 % (ref 4.8–5.6)
Mean Plasma Glucose: 105.41 mg/dL

## 2020-08-12 LAB — ETHANOL: Alcohol, Ethyl (B): <10 mg/dL (ref <10)

## 2020-08-12 LAB — PREGNANCY, URINE: Preg Test, Ur: NEGATIVE

## 2020-08-12 LAB — LIPID PANEL
Cholesterol: 167 mg/dL (ref 0–200)
HDL: 63 mg/dL (ref >40)
LDL Cholesterol: 89 mg/dL (ref 0–99)
Total CHOL/HDL Ratio: 2.7 RATIO
Triglycerides: 73 mg/dL (ref <150)
VLDL: 15 mg/dL (ref 0–40)

## 2020-08-12 LAB — TSH: TSH: 2.65 u[IU]/mL (ref 0.350–4.500)

## 2020-08-12 LAB — POC SARS CORONAVIRUS 2 AG: SARSCOV2ONAVIRUS 2 AG: NEGATIVE

## 2020-08-12 LAB — POCT PREGNANCY, URINE: Preg Test, Ur: NEGATIVE

## 2020-08-12 MED ORDER — TAMOXIFEN CITRATE 10 MG PO TABS
20.0000 mg | ORAL_TABLET | Freq: Every day | ORAL | Status: DC
  Filled 2020-08-12: qty 2

## 2020-08-12 MED ORDER — CEPHALEXIN 500 MG PO CAPS
500.0000 mg | ORAL_CAPSULE | Freq: Two times a day (BID) | ORAL | Status: DC
  Administered 2020-08-12: 500 mg via ORAL
  Filled 2020-08-12: qty 1
  Filled 2020-08-12: qty 2
  Filled 2020-08-12 (×2): qty 1

## 2020-08-12 MED ORDER — IBUPROFEN 800 MG PO TABS
800.0000 mg | ORAL_TABLET | Freq: Two times a day (BID) | ORAL | Status: DC | PRN
Start: 2020-08-12 — End: 2020-08-12
  Administered 2020-08-12: 800 mg via ORAL
  Filled 2020-08-12: qty 1

## 2020-08-12 MED ORDER — MAGNESIUM HYDROXIDE 400 MG/5ML PO SUSP: 30.0000 mL | Freq: Every day | ORAL | Status: DC | PRN

## 2020-08-12 MED ORDER — CEPHALEXIN 500 MG PO CAPS
500.0000 mg | ORAL_CAPSULE | Freq: Two times a day (BID) | ORAL | Status: DC
  Filled 2020-08-12: qty 1

## 2020-08-12 MED ORDER — EPINEPHRINE 0.3 MG/0.3ML IJ SOAJ: 0.3000 mg | Freq: Once | INTRAMUSCULAR | Status: DC | PRN

## 2020-08-12 MED ORDER — ALBUTEROL SULFATE HFA 108 (90 BASE) MCG/ACT IN AERS: 2.0000 | INHALATION_SPRAY | Freq: Four times a day (QID) | RESPIRATORY_TRACT | Status: DC | PRN

## 2020-08-12 MED ORDER — FLUTICASONE PROPIONATE 50 MCG/ACT NA SUSP
1.0000 | Freq: Every day | NASAL | Status: DC
  Filled 2020-08-12 (×2): qty 16

## 2020-08-12 MED ORDER — TAMOXIFEN CITRATE 20 MG PO TABS: 20.0000 mg | ORAL_TABLET | Freq: Every day | ORAL | Status: DC

## 2020-08-12 MED ORDER — EPINEPHRINE 0.3 MG/0.3ML IJ SOAJ: 0.3000 mg | Freq: Every day | INTRAMUSCULAR | Status: DC | PRN

## 2020-08-12 MED ORDER — ALUM & MAG HYDROXIDE-SIMETH 200-200-20 MG/5ML PO SUSP: 30.0000 mL | ORAL | Status: DC | PRN

## 2020-08-12 NOTE — ED Notes (Signed)
Pt became annoyed with pt in bed to their right and loudly exclaimed "God Dammit! Shut up!". Pt redirected by MHT.
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
## 2020-08-12 NOTE — Progress Notes (Signed)
Patient ID: Catherine Olsen, female   DOB: May 09, 1970, 50 y.o.   MRN: [id number]   Discharge Note:  Patient denies SI/HI at this time. Discharge instructions, AVS, prescriptions gone over with patient and family. Patient agrees to comply with medication management, follow-up visit, and outpatient therapy. Patient questions and concerns addressed and answered. All personal property returned. Patient discharged to home via [place] transportation.

## 2020-08-12 NOTE — ED Notes (Signed)
Pt asked MHT if we could check their belongings before transport. Pt was kindly reminded that she was present for the belongings documentation. Pt is concerned because not every item in her bags was "itemized" and that she does not trust security. Writer assured pt that there are cameras in the area that watch the locker room. This did not provide comfort to the pt as "security has everything to do with the cameras and they can do whatever they want". Writer offered support to pt.

## 2020-08-12 NOTE — ED Notes (Addendum)
Locker #19. Patient requested to witness transfer of belongings from [place] locker to locker 19. Writer, RN Juliann Pulse B., security, and patient present as witnesses to belongings transfer and belongings documentation.  Items listed in detail on belongings documentation sheet in patients chart.

## 2020-08-12 NOTE — Tx Team (Signed)
Initial Treatment Plan 08/12/2020 6:45 AM [name] CNO:[id number]    PATIENT STRESSORS: Financial difficulties Health problems Marital or family conflict Substance abuse Traumatic event   PATIENT STRENGTHS: Capable of independent living General fund of knowledge Religious Affiliation   PATIENT IDENTIFIED PROBLEMS: Depression  Suicidal ideation   Anxiety    "I would like to be able to cope"  "Get my mind together"           DISCHARGE CRITERIA:  Improved stabilization in mood, thinking, and/or behavior Need for constant or close observation no longer present Reduction of life-threatening or endangering symptoms to within safe limits Verbal commitment to aftercare and medication compliance  PRELIMINARY DISCHARGE PLAN: Outpatient therapy Medication management  PATIENT/FAMILY INVOLVEMENT: This treatment plan has been presented to and reviewed with the patient, Catherine Olsen.  The patient and family have been given the opportunity to ask questions and make suggestions.  Windell Moment, RN 08/12/2020, 6:45 AM

## 2020-08-12 NOTE — Progress Notes (Signed)
  [facility] Adult Case Management Discharge Plan :  Will you be returning to the same living situation after discharge:  Yes,  with mother At discharge, do you have transportation home?: No.  CSW will arrange with [facility] Do you have the ability to pay for your medications: Yes,  insurance and income  Release of information consent forms completed and emailed to Medical Records, then turned in to Medical Records by CSW.   Patient to Follow up at:  Follow-up [place], [facility]. Schedule an appointment as soon as possible for a visit.   Specialty: Behavioral Health Why: Please call this agency to make your next medication management appointment with [name], NP. Contact information: [place] [address] [phone]        [facility] Schedule an appointment as soon as possible for a visit.   Specialty: Professional Counselor Why: Please call this agency to make your next therapy appointment with [name], LCSW, LCAS. Contact information: [facility] [place] [address] [phone]               Next level of care provider has access to [place] and Suicide Prevention discussed: No.   Patient declined  Have you used any form of tobacco in the last 30 days? (Cigarettes, Smokeless Tobacco, Cigars, and/or Pipes): Yes  Has patient been referred to the Quitline?: Patient refused referral  Patient has been referred for addiction treatment: [place], LCSW 08/12/2020, 9:59 AM

## 2020-08-12 NOTE — H&P (Signed)
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 a 50 year old female with the above-stated past psychiatric history who was admitted through the behavioral health urgent care center.  The patient denies suicidal or homicidal ideation.  The patient refuses all medications for treatment of any of her particular disorders.  I have given her options to take medications as well as remain in the hospital until tomorrow so we can refer her for a new psychiatrist or therapist.   When she was offered that she does not want to change.  I do not believe that she is a risk to self or others.  I do believe that she needs to be treated, but we are not in a position to forced medications on this patient.  She will be discharged to home.  Observation Level/Precautions:  15 minute checks  Laboratory:  Chemistry Profile  Psychotherapy:    Medications:    Consultations:    Discharge Concerns:    Estimated LOS:  Other:     Physician Treatment Plan for Primary Diagnosis: <principal problem not specified> Long Term Goal(s): Improvement in symptoms so as ready for discharge  Short Term Goals: Ability to identify changes in lifestyle to reduce recurrence of condition will improve, Ability to verbalize feelings will improve, Ability to identify and develop effective coping behaviors will improve, Ability to maintain clinical measurements within normal limits will improve and Compliance with prescribed medications will improve  Physician Treatment Plan for Secondary Diagnosis: Active Problems:   Bipolar 1 disorder, depressed, severe ([place])  Long Term Goal(s): Improvement in symptoms so as ready for discharge  Short Term Goals: Ability to identify changes in lifestyle to reduce recurrence of condition will improve, Ability to verbalize feelings will improve, Ability to demonstrate self-control will improve, Ability to identify and develop effective coping behaviors will improve, Ability to maintain clinical measurements within normal limits will improve and Compliance with prescribed medications will improve  I certify that inpatient services furnished can reasonably be expected to improve the patient's condition.    Sharma Covert, MD 5/8/20229:36 AM

## 2020-08-12 NOTE — [facility] Suicide Risk Assessment (Signed)
[facility] INPATIENT:  Family/Significant Other Suicide Prevention Education  Suicide Prevention Education:  Patient Refusal for Family/Significant Other Suicide Prevention Education: The patient Catherine Olsen has refused to provide written consent for family/significant other to be provided Family/Significant Other Suicide Prevention Education during admission and/or prior to discharge.  Physician notified.  Suicide Prevention Education was reviewed with patient, and brochure was provided to patient to share with [facility].  Patient acknowledged the ways in which they are at risk, and how working through each of their issues can gradually start to reduce their risk factors.  Patient was encouraged to think of the information in the context of people in their own lives.  Patient denied having access to firearms  Patient verbalized understanding of information provided.  Patient endorsed a desire to live.      Berlin Hun Grossman-Orr 08/12/2020, 9:55 AM

## 2020-08-12 NOTE — [facility] Counselor (Signed)
Adult Comprehensive Assessment  Patient ID: Catherine Olsen, female   DOB: April 19, 1970, 50 y.o.   MRN: [id number]  Information Source:  Patient   (Full assessment not done because patient is leaving in less than 24 hours.)  Living/Environment/Situation:  Living Arrangements: Parent  Discharge Plan:   Currently receiving community mental health services: Yes (From Whom) Catherine Olsen at [facility] and Catherine Olsen at [facility]) Patient states concerns and preferences for aftercare planning are: Will make her own appointments and will accept a list of other entities that take her insurance, to consider for possible future appointments. Patient states they will know when they are safe and ready for discharge when: States she is not suicidal, can leave now. She does not want medication to solve her issues and is not happy that the doctor has offered that sole solution. Does patient have access to transportation?: No Does patient have financial barriers related to discharge medications?: No Patient description of barriers related to discharge medications: Has disability income and 2 forms of insurance -- likely cost of medicines is quite low. Plan for no access to transportation at discharge: CSW to arrange with [place] for a Lyft Will patient be returning to same living situation after discharge?: Yes  Summary/Recommendations:   Summary and Recommendations (to be completed by the evaluator): Patient is a 51yo female with Bipolar Disorder and PTSD who stated she was "having a nervous breakdown."  She had suicidal ideation with a plan to hang herself, saying she had researched methods of suicide and that was the most effective.  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  She has an ear infection and has just been diagnosed with cancer, for which she is refusing biopsy but is on medicines.  She lives with mother and they have conflicts,  plus are experiencing financial stress.  She just learned that a friend died in an MVA and another friend has been incarcerated.  She does not wish to be on medicines suggested by doctor and as such is open to discharge.  She denies current suicidal ideation, does not want suicide prevention education provided to anyone in her family, and says she will return to live with mother.  She will set follow-up appointments herself at [facility] for medication management with Catherine Chapel, NP, and at [facility] with Catherine Olsen for therapy.  CSW is also providing her a list of other entities that accept her Medicaid/Medicare insurances, so that she can use that to possibly make other appointments as needed.  Catherine Los. 08/12/2020
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
## 2020-08-12 NOTE — ED Notes (Signed)
Safe transport arrived to transport patient to [facility].  Patient A&O X4 , ambulatory.  Escorted to [place] without issue.  Belongings given to officer.  Patient discharged in stable condition.  No acute distress noted and safety maintained.

## 2020-08-12 NOTE — ED Notes (Signed)
Patient cooperative with admission process and skin check.  Baldo Ash, [facility] present.  Patient requesting to keep bra.  Stated that the under wire is plastic.  Per [facility], patient may keep bra on unit.  Patient requesting to witness transfer of possessions from one locker to another.  Patient included in transfer and agreed all belongings were moved from one locker to the other.  Patient oriented to unit and offered food and fluids.

## 2020-08-12 NOTE — ED Notes (Signed)
Report called to Nira Conn, RN at [facility].
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
## 2020-08-12 NOTE — Plan of Care (Signed)
  Problem: Education: Goal: Emotional status will improve Outcome: Progressing Goal: Mental status will improve Outcome: Progressing   

## 2020-08-12 NOTE — ED Provider Notes (Signed)
FBC/OBS ASAP Discharge Summary  Date and Time: 08/12/2020 3:51 AM  Name: Catherine Olsen  MRN:  [id number]   Discharge Diagnoses:  Final diagnoses:  None    Subjective: Suicidal with plan to hang herself due to multiple stressors   Stay Summary: patient admitted to the unit, patient   Catherine Olsen is 49y/o female with psychiatric history of Bipolar 1 disorder, suicidal attempt, anxiety, PTSD and cocaine abuse. Patient presented to [facility] voluntarily with chief complaint of suicidal ideation with plan to hang herself. Patient states "I am on a  verge of a breakdown and I need to be admitted into the hospital for help." She report that she is feeling "very anxious and depressed" due to "financial difficulty, breast cancer diagnosis, mother's health, and not taking any mental health medications." Patient report that she stopped taking medication for her psychiatric diagnosis due to their potential of worsening breast cancer. She report "my cancer doctor and I discovered that these medication affect my prolactin levels. The medications were causing inflammation in my breast so I stopped taking them." she report that last took Depakote in Dec. 2021 and that she took Seroquel last in March 2022. She also report that she weaned herself off benzos due to "falling a lot and it cause me to have cognitive damage."  Patient endorses SI with plan to hang herself; she denies HI and AVH. She endorses feeling paranoid and states "I feel like the police are watching me." she admits to using cocaine, last use was ~1 month ago. She denies alcohol and other substance abuse. She resides at home with her mother.   Patient was assessed by this NP. Patient is alert and oriented, her speech is clear and coherent, she maintained good eye contact, patient's mood is anxious, her affect is congruent with mood. She denies respiratory distress, SOB, chest pain, dizziness, GI/GU symptoms, or headache.   Total Time spent with patient:  15 minutes  Past Psychiatric History:  Past Medical History:  Past Medical History:  Diagnosis Date  . Abnormal uterine bleeding (AUB) 06/25/2009   Qualifier: Diagnosis of  By: Cathren Laine MD, Ankit    . Allergy   . Angio-edema   . Anxiety   . Arthritis    hands, lower back, knees  . Asthma   . Bipolar 1 disorder ([place])   . Breast cancer (HCC)    stage 0  . Chronic prescription benzodiazepine use 09/08/2018  . COPD (chronic obstructive pulmonary disease) ([place])   . Depression   . Endometriosis   . Fibromyalgia   . GERD (gastroesophageal reflux disease)   . Headache(784.0)    otc med prn  . Migraine   . Pituitary tumor    microadenoma  . PONV (postoperative nausea and vomiting)   . PTSD (post-traumatic stress disorder)   . Scoliosis   . Termination of pregnancy    x 2 at age 54 and 50 yrs old  . Tobacco abuse 03/04/2015  . Urticaria     Past Surgical History:  Procedure Laterality Date  . BREAST BIOPSY Right 05/19/2007  . BREAST BIOPSY Right 06/02/2007  . BREAST BIOPSY  06/29/2019  . BREAST EXCISIONAL BIOPSY    . BREAST LUMPECTOMY Right 07/21/2019  . BREAST LUMPECTOMY WITH RADIOACTIVE SEED LOCALIZATION Right 07/21/2019   Procedure: RIGHT BREAST LUMPECTOMY WITH RADIOACTIVE SEED LOCALIZATION;  Surgeon: Alphonsa Overall, MD;  Location: [facility];  Service: General;  Laterality: Right;  . BREAST SURGERY    . DILATION  AND CURETTAGE OF UTERUS    . FACIAL COSMETIC SURGERY     right cheek  . LAPAROSCOPY Right 11/11/2013   Procedure: LAPAROSCOPY OPERATIVE with  Drainage of  RIGHT Ovarian ENDOMETRIOMA;  Surgeon: Elveria Royals, MD;  Location: [facility];  Service: Gynecology;  Laterality: Right;  . NASAL ENDOSCOPY     said it showed some acid reflux from ENT  . NOSE SURGERY     rhinoplasty at age 57 yrs  . WISDOM TOOTH EXTRACTION     Family History:  Family History  Problem Relation Age of Onset  . Diabetes Mother   . Hypertension Mother   . Stroke Mother 3       CVA   . Mental illness Mother        no diagnosis; personality disorder  . Hyperlipidemia Father   . Hypertension Father   . COPD Father   . Alpha-1 antitrypsin deficiency Father   . Asthma Father   . Alpha-1 antitrypsin deficiency Brother   . Asthma Brother   . Diabetes Maternal Grandmother   . Heart disease Maternal Grandmother   . Hyperlipidemia Maternal Grandmother   . Hypertension Maternal Grandmother   . Mental illness Maternal Grandmother   . Heart disease Maternal Grandfather   . Hyperlipidemia Maternal Grandfather   . Hypertension Maternal Grandfather   . Asthma Maternal Grandfather   . Heart disease Paternal Grandfather   . Hyperlipidemia Paternal Grandfather   . Hypertension Paternal Grandfather   . Stroke Paternal Grandfather   . Alzheimer's disease Paternal Grandmother   . Allergic rhinitis Neg Hx   . Angioedema Neg Hx   . Eczema Neg Hx   . Urticaria Neg Hx   . Immunodeficiency Neg Hx   . Colon cancer Neg Hx   . Esophageal cancer Neg Hx    Family Psychiatric History:  Social History:  Social History   Substance and Sexual Activity  Alcohol Use Yes  . Alcohol/week: 2.0 standard drinks  . Types: 2 Cans of beer per week   Comment: 6 nights     Social History   Substance and Sexual Activity  Drug Use Not Currently  . Types: "Crack" cocaine   Comment: 07/19/2019    Social History   Socioeconomic History  . Marital status: Divorced    Spouse name: Not on file  . Number of children: 0  . Years of education: Not on file  . Highest education level: Bachelor's degree (e.g., BA, AB, BS)  Occupational History  . Occupation: disability    Comment: mental illness  Tobacco Use  . Smoking status: Current Every Day Smoker    Packs/day: 0.50    Years: 29.00    Pack years: 14.50    Types: Cigarettes    Last attempt to quit: 10/08/2019    Years since quitting: 0.8  . Smokeless tobacco: Never Used  Vaping Use  . Vaping Use: Former  Substance and Sexual Activity   . Alcohol use: Yes    Alcohol/week: 2.0 standard drinks    Types: 2 Cans of beer per week    Comment: 6 nights  . Drug use: Not Currently    Types: "Crack" cocaine    Comment: 07/19/2019  . Sexual activity: Not Currently    Comment: abortion at 54 and 73yrs. of age   Other Topics Concern  . Not on file  Social History Narrative   Marital status: divorced; not dating      Children: none  Lives: alone with mom      Employment: disability for mental illness in 1997.  Hospitalizations x 9 in past.      Tobacco: 1 ppd x since age 67. Decreasing in 2018.      Alcohol: socially; beers.      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      Exercise: yoga daily; exercise daily.      ADLs: independent with ADLs; no car; depends on others for transportation.      Patient is right-handed. She is divorced and lives with her mother. She drinks 2-3 cups of 1/2 caffeine coffe a day. She walks occasionally for exercise.   Social Determinants of Health   Financial Resource Strain: Not on file  Food Insecurity: Not on file  Transportation Needs: Not on file  Physical Activity: Not on file  Stress: Not on file  Social Connections: Not on file   SDOH:  SDOH Screenings   Alcohol Screen: Not on file  Depression (PHQ2-9): Medium Risk  . PHQ-2 Score: 22  Financial Resource Strain: Not on file  Food Insecurity: Not on file  Housing: Not on file  Physical Activity: Not on file  Social Connections: Not on file  Stress: Not on file  Tobacco Use: High Risk  . Smoking Tobacco Use: Current Every Day Smoker  . Smokeless Tobacco Use: Never Used  Transportation Needs: Not on file    Has this patient used any form of tobacco in the last 30 days? (Cigarettes, Smokeless Tobacco, Cigars, and/or Pipes) Prescription not provided because: patient denies tobacco use   Current Medications:  Current Facility-Administered Medications  Medication Dose Route Frequency Provider Last Rate  Last Admin  . cephALEXin (KEFLEX) capsule 500 mg  500 mg Oral BID Jaleil Renwick A, NP      . ibuprofen (ADVIL) tablet 800 mg  800 mg Oral BID PRN Maritza Hosterman A, NP   800 mg at 08/12/20 0213  . tamoxifen (NOLVADEX) tablet 20 mg  20 mg Oral Daily Candid Bovey A, NP       Current Outpatient Medications  Medication Sig Dispense Refill  . albuterol (VENTOLIN HFA) 108 (90 Base) MCG/ACT inhaler Inhale 2 puffs into the lungs every 6 (six) hours as needed for wheezing or shortness of breath. 8 g 3  . cephALEXin (KEFLEX) 500 MG capsule Take 1 capsule (500 mg total) by mouth 2 (two) times daily for 14 days. 28 capsule 0  . cetirizine (ZYRTEC) 10 MG tablet Take 1 tablet (10 mg total) by mouth daily. (Patient not taking: Reported on 06/20/2020) 30 tablet 5  . EPINEPHrine 0.3 mg/0.3 mL IJ SOAJ injection Inject 0.3 mg into the skin as needed for anaphylaxis. (Patient not taking: Reported on 06/20/2020) 1 each 0  . ibuprofen (ADVIL) 800 MG tablet Take 1 tablet (800 mg total) by mouth 3 (three) times daily. 90 tablet 1  . Melatonin 10 MG TABS Take 5 mg by mouth at bedtime.    . tamoxifen (NOLVADEX) 20 MG tablet TAKE 1 TABLET(10 MG) BY MOUTH DAILY 90 tablet 3  . tretinoin (RETIN-A) 0.025 % cream Apply topically at bedtime. For acne 45 g 0    PTA Medications: (Not in a hospital admission)   Musculoskeletal  Strength & Muscle Tone: within normal limits Gait & Station: normal Patient leans: Right  Psychiatric Specialty Exam  Presentation  General Appearance: Appropriate for Environment  Eye Contact:Good  Speech:Clear and Coherent  Speech Volume:Normal  Handedness:Right  Mood and Affect  Mood:Anxious  Affect:Congruent   Thought Process  Thought Processes:Coherent; Goal Directed  Descriptions of Associations:Intact  Orientation:Full (Time, Place and Person)  Thought Content:WDL  Diagnosis of Schizophrenia or Schizoaffective disorder in past: No    Hallucinations:Hallucinations:  None  Ideas of Reference:None  Suicidal Thoughts:Suicidal Thoughts: Yes, Active SI Active Intent and/or Plan: With Plan  Homicidal Thoughts:Homicidal Thoughts: No   Sensorium  Memory:Immediate Good; Recent Good; Remote Good  Judgment:Fair  Insight:Good   Executive Functions  Concentration:Good  Attention Span:Good  [place] of Knowledge:Good  Language:Good   Psychomotor Activity  Psychomotor Activity:Psychomotor Activity: Normal   Assets  Assets:Communication Skills; Desire for Improvement; Housing; Social Support   Sleep  Sleep:Sleep: Fair   Nutritional Assessment (For OBS and [facility] admissions only) Has the patient had a weight loss or gain of 10 pounds or more in the last 3 months?: Yes Has the patient had a decrease in food intake/or appetite?: Yes Does the patient have dental problems?: No Does the patient have eating habits or behaviors that may be indicators of an eating disorder including binging or inducing vomiting?: No Has the patient recently lost weight without trying?: Yes, 2-13 lbs. Has the patient been eating poorly because of a decreased appetite?: No Malnutrition Screening Tool Score: 1    Physical Exam  Physical Exam ROS Blood pressure 119/86, pulse 60, temperature 97.7 F (36.5 C), resp. rate 16, last menstrual period 11/21/2014, SpO2 100 %. There is no height or weight on file to calculate BMI.    Disposition: Discharged to [place], NP 08/12/2020, 3:51 AM

## 2020-08-12 NOTE — BHH Suicide Risk Assessment (Signed)
[facility] Discharge Suicide Risk Assessment   Principal Problem: <principal problem not specified> Discharge Diagnoses: Active Problems:   Bipolar 1 disorder, depressed, severe (HCC)   Total Time spent with patient: 30 minutes  Musculoskeletal: Strength & Muscle Tone: within normal limits Gait & Station: normal Patient leans: N/A  Psychiatric Specialty Exam: Review of Systems  HENT: Positive for congestion, ear discharge, sinus pressure and sinus pain.   Psychiatric/Behavioral: Positive for dysphoric mood.  All other systems reviewed and are negative.   Blood pressure (!) 121/96, pulse 85, temperature 97.7 F (36.5 C), temperature source Oral, resp. rate 18, height 5\' 8"  (1.727 m), weight 63.1 kg, last menstrual period 11/21/2014, SpO2 100 %.Body mass index is 21.15 kg/m.  General Appearance: Fairly Groomed  Engineer, water::  Fair  Speech:  Normal [id number]  Volume:  Normal  Mood:  Anxious  Affect:  Congruent  Thought Process:  Coherent and Descriptions of Associations: Loose  Orientation:  Full (Time, Place, and Person)  Thought Content:  Delusions  Suicidal Thoughts:  No  Homicidal Thoughts:  No  Memory:  Immediate;   Fair Recent;   Fair Remote;   Fair  Judgement:  Impaired  Insight:  Lacking  Psychomotor Activity:  Increased  Concentration:  Fair  Recall:  [employer] of Knowledge:Fair  Language: Good  Akathisia:  Negative  Handed:  Right  AIMS (if indicated):     Assets:  Desire for Improvement Housing Resilience  Sleep:     Cognition: WNL  ADL's:  Intact   Mental Status Per Nursing Assessment::   On Admission:  Suicidal ideation indicated by patient,Self-harm thoughts  Demographic Factors:  Caucasian, Low socioeconomic status and Unemployed  Loss Factors: Decline in physical health  Historical Factors: Impulsivity  Risk Reduction Factors:   Living with another person, especially a relative and Positive social support  Continued Clinical Symptoms:   Bipolar Disorder:   Mixed State Currently Psychotic  Cognitive Features That Contribute To Risk:  Thought constriction (tunnel vision)    Suicide Risk:  Minimal: No identifiable suicidal ideation.  Patients presenting with no risk factors but with morbid ruminations; may be classified as minimal risk based on the severity of the depressive symptoms    Plan Of Care/Follow-up recommendations:  Activity:  ad lib  Sharma Covert, MD 08/12/2020, 9:33 AM

## 2020-08-13 ENCOUNTER — Encounter: Payer: Self-pay | Admitting: Hematology and Oncology

## 2020-08-13 ENCOUNTER — Other Ambulatory Visit: Payer: Self-pay | Admitting: *Deleted

## 2020-08-13 LAB — PROLACTIN: Prolactin: 8.2 ng/mL (ref 4.8–23.3)

## 2020-08-13 MED ORDER — FLUCONAZOLE 200 MG PO TABS: 200.0000 mg | ORAL_TABLET | Freq: Every day | ORAL | 0 refills | Status: DC

## 2020-08-13 NOTE — BH Assessment (Signed)
Care Management - Follow Up [facility] Discharges   Writer left a voice mail message.   Per chart review, patient was discharged from [facility] inpatient at [facility] on Aug 12, 2020.  Per chart review, patient will follow up with [facility] and [facility].

## 2020-08-23 ENCOUNTER — Encounter: Payer: Self-pay | Admitting: Hematology and Oncology

## 2020-08-27 ENCOUNTER — Encounter: Payer: Self-pay | Admitting: Hematology and Oncology

## 2020-09-04 ENCOUNTER — Ambulatory Visit (HOSPITAL_COMMUNITY)
Admission: EM | Admit: 2020-09-04 | Discharge: 2020-09-04 | Disposition: A | Discharge status: Home / Self Care | Payer: Medicare Other | Attending: Psychiatry | Admitting: Psychiatry

## 2020-09-04 ENCOUNTER — Other Ambulatory Visit: Payer: Self-pay

## 2020-09-04 DIAGNOSIS — F3162 Bipolar disorder, current episode mixed, moderate: Secondary | ICD-10-CM | POA: Diagnosis not present

## 2020-09-04 DIAGNOSIS — Z79899 Other long term (current) drug therapy: Secondary | ICD-10-CM | POA: Diagnosis not present

## 2020-09-04 DIAGNOSIS — F4310 Post-traumatic stress disorder, unspecified: Secondary | ICD-10-CM | POA: Diagnosis not present

## 2020-09-04 DIAGNOSIS — Z91410 Personal history of adult physical and sexual abuse: Secondary | ICD-10-CM | POA: Diagnosis not present

## 2020-09-04 DIAGNOSIS — C50919 Malignant neoplasm of unspecified site of unspecified female breast: Secondary | ICD-10-CM | POA: Insufficient documentation

## 2020-09-04 NOTE — ED Notes (Signed)
Pt discharged in no acute distress. Verbalized understanding of instructions and recommendations reviewed on AVS. Safety maintained.
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
## 2020-09-04 NOTE — Progress Notes (Signed)
   09/04/20 1751  [place] (Walk-ins at [facility] only)  How Did You Hear About [place]? Self  What Is the Reason for Your Visit/Call Today? Patient presents at the recommendation of a former therapist,  [name] of [facility], who called to check on the patient she hadn't seen in over a month.  During the phone call, patient reported worsenig depression and anxiety.  She was seen on 5/8 to discuss medications she can take with Tamoxifen, however did not agree with recommendations.  She has since discontinued tx with her psychiatrist and will not return to Debbe Bales of [facility].  At this time she is requesting to see a provider to discuss medication options again, as she insists there has to be something she can take while taking Tamoxifen for symptom management.  How Long Has This Been Causing You Problems? 1-6 months  Have You Recently Had Any Thoughts About Hurting Yourself? No  Are You Planning to Commit Suicide/Harm Yourself At This time? No  Have you Recently Had Thoughts About [place]? No  Are You Planning To Harm Someone At This Time? No  Are you currently experiencing any auditory, visual or other hallucinations? No  Have You Used Any Alcohol or Drugs in the Past 24 Hours? No  Do you have any current medical co-morbidities that require immediate attention? Yes  Please describe current medical co-morbidities that require immediate attention: Breast Cancer - next MRI September - under care of oncologist  What Do You Feel Would Help You the Most Today? Medication(s);Treatment for Depression or other mood problem  If access to [facility] was not available, would you have sought care in the Emergency Department? No  Determination of Need Routine (7 days)  Options For Referral Medication Management

## 2020-09-04 NOTE — Discharge Instructions (Signed)
Patient is instructed to return for Walk-in OP clinic at [facility].  You are encouraged to follow up with [facility] for outpatient treatment.  Walk in/ Open Access Hours: Monday - Friday 8AM - 11AM (To see provider and therapist) Friday - [place] (To see therapist only)  [facility] [address] [place], [place]

## 2020-09-05 ENCOUNTER — Ambulatory Visit (HOSPITAL_COMMUNITY): Payer: Medicare Other | Admitting: Physician Assistant

## 2020-09-05 ENCOUNTER — Encounter: Payer: Self-pay | Admitting: Critical Care Medicine

## 2020-09-05 ENCOUNTER — Ambulatory Visit: Payer: Medicare Other | Attending: Critical Care Medicine | Admitting: Critical Care Medicine

## 2020-09-05 DIAGNOSIS — F314 Bipolar disorder, current episode depressed, severe, without psychotic features: Secondary | ICD-10-CM

## 2020-09-05 DIAGNOSIS — Z79899 Other long term (current) drug therapy: Secondary | ICD-10-CM | POA: Diagnosis not present

## 2020-09-05 DIAGNOSIS — J431 Panlobular emphysema: Secondary | ICD-10-CM | POA: Diagnosis not present

## 2020-09-05 DIAGNOSIS — F315 Bipolar disorder, current episode depressed, severe, with psychotic features: Secondary | ICD-10-CM | POA: Diagnosis not present

## 2020-09-05 DIAGNOSIS — F17210 Nicotine dependence, cigarettes, uncomplicated: Secondary | ICD-10-CM | POA: Diagnosis not present

## 2020-09-05 DIAGNOSIS — D0511 Intraductal carcinoma in situ of right breast: Secondary | ICD-10-CM

## 2020-09-05 DIAGNOSIS — J3089 Other allergic rhinitis: Secondary | ICD-10-CM

## 2020-09-05 DIAGNOSIS — H6123 Impacted cerumen, bilateral: Secondary | ICD-10-CM | POA: Diagnosis not present

## 2020-09-05 NOTE — Assessment & Plan Note (Signed)
Severe bipolar disorder with psychotic features  Patient is only on trazodone at this time to help sleep at 50 mg  Multiple side effects to multiple mental health medications previously prescribed  Patient has an established appointment with [facility] on June 3 she is encouraged to keep this established appointment
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
## 2020-09-05 NOTE — Assessment & Plan Note (Signed)
We discussed the importance of reducing tobacco intake  Patient will continue albuterol use as needed

## 2020-09-05 NOTE — Assessment & Plan Note (Signed)
Patient has significant allergic rhinitis Patient reluctant to receive antihistamines due to being on tamoxifen we will continue Flonase

## 2020-09-05 NOTE — Assessment & Plan Note (Signed)
Patient currently on tamoxifen care per oncology

## 2020-09-05 NOTE — Patient Instructions (Signed)
No change in medicaiton Your ear is clear on the left Stay on flonase as you are taking Your increased tobacco intake is causing mild airway inflammation, use the albuterol as needed  Keep you appointment with behavioral health on this Friday June 3  Return for primary care follow up Dr Joya Gaskins in 3 months

## 2020-09-05 NOTE — Assessment & Plan Note (Signed)
No cerumen impaction at this time this has resolved

## 2020-09-07 ENCOUNTER — Ambulatory Visit (INDEPENDENT_AMBULATORY_CARE_PROVIDER_SITE_OTHER): Payer: Medicare Other | Admitting: Physician Assistant

## 2020-09-07 ENCOUNTER — Other Ambulatory Visit: Payer: Self-pay

## 2020-09-07 ENCOUNTER — Encounter (HOSPITAL_COMMUNITY): Payer: Self-pay | Admitting: Physician Assistant

## 2020-09-07 VITALS — BP 126/90 | HR 85 | Ht 68.0 in | Wt 135.0 lb

## 2020-09-07 DIAGNOSIS — G4700 Insomnia, unspecified: Secondary | ICD-10-CM | POA: Diagnosis not present

## 2020-09-07 DIAGNOSIS — F3160 Bipolar disorder, current episode mixed, unspecified: Secondary | ICD-10-CM

## 2020-09-07 DIAGNOSIS — F4310 Post-traumatic stress disorder, unspecified: Secondary | ICD-10-CM | POA: Diagnosis not present

## 2020-09-07 MED ORDER — LAMOTRIGINE 25 MG PO TABS: 25.0000 mg | ORAL_TABLET | Freq: Every day | ORAL | 1 refills | Status: DC
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
## 2020-09-08 DIAGNOSIS — R413 Other amnesia: Secondary | ICD-10-CM

## 2020-09-09 ENCOUNTER — Encounter (HOSPITAL_COMMUNITY): Payer: Self-pay | Admitting: Physician Assistant

## 2020-09-10 ENCOUNTER — Telehealth (HOSPITAL_COMMUNITY): Payer: Self-pay | Admitting: Physician Assistant

## 2020-09-10 NOTE — Telephone Encounter (Signed)
Patient called to speak with provider.

## 2020-09-11 ENCOUNTER — Telehealth (HOSPITAL_COMMUNITY): Payer: Self-pay | Admitting: *Deleted

## 2020-09-11 NOTE — Telephone Encounter (Signed)
Patient called requesting to speak with provider. Patient expressed concerns about not being able to reach her provider via [place]. Writer expressed understanding and explained that our providers typically respond to all messages within 24-48 business hours, if they are available. If not, their messages are overseen by another provider. Patient expressed concern for wanting and needing immediate attention regarding her mental health and medications from her assigned provider. Writer ensured patient that her message will again be forwarded to the provider with more detail than she provider on previous phone encounter. Patient was understanding.

## 2020-09-11 NOTE — Telephone Encounter (Signed)
Patient called requesting to speak with provider. Patient expressed concerns about not being able to reach her provider via [place]. Writer expressed understanding and explained that our providers typically respond to all messages within 24-48 business hours, if they are available. If not, their messages are overseen by another provider. Patient expressed concern for wanting and needing immediate attention regarding her mental health and medications from her assigned provider. Writer ensured patient that her message will again be forwarded to the provider with more detail than she provider on previous phone encounter. Patient was understanding.

## 2020-09-11 NOTE — Telephone Encounter (Signed)
Patient called requesting to speak with provider. Patient expressed concerns about not being able to reach her provider via [place]. Writer expressed understanding and explained that our providers typically respond to all messages within 24-48 business hours, if they are available. If not, their messages are overseen by another provider. Patient expressed concern for wanting and needing immediate attention regarding her mental health and medications from her assigned provider. Writer ensured patient that her message will again be forwarded to the provider with more detail than she provider on previous phone encounter. Patient was understanding.

## 2020-09-12 ENCOUNTER — Other Ambulatory Visit (HOSPITAL_COMMUNITY): Payer: Self-pay | Admitting: Physician Assistant

## 2020-09-12 DIAGNOSIS — F4310 Post-traumatic stress disorder, unspecified: Secondary | ICD-10-CM | POA: Diagnosis not present

## 2020-09-12 DIAGNOSIS — F3181 Bipolar II disorder: Secondary | ICD-10-CM | POA: Diagnosis not present

## 2020-09-12 DIAGNOSIS — G4700 Insomnia, unspecified: Secondary | ICD-10-CM

## 2020-09-12 DIAGNOSIS — F422 Mixed obsessional thoughts and acts: Secondary | ICD-10-CM | POA: Diagnosis not present

## 2020-09-12 MED ORDER — TRAZODONE HCL 100 MG PO TABS: 100.0000 mg | ORAL_TABLET | Freq: Every day | ORAL | 1 refills | Status: DC

## 2020-09-12 NOTE — Progress Notes (Signed)
Patient was contacted by provider yesterday. Patient inquired about being placed on Trazodone 100 mg at bedtime for the management of her sleep. Patient was properly advised about arrhythmogenic potentiality of Trazodone. Patient was receptive to information provided. Patient informed provider that she has taken the medication in the past and has not experienced any adverse effects from it. Patient's medication to be e-prescribed to pharmacy of choice.

## 2020-09-12 NOTE — Telephone Encounter (Signed)
Message acknowledged. Patient was contacted.

## 2020-09-12 NOTE — Telephone Encounter (Signed)
Provider was contacted by Arbutus Leas regarding patient's request to speak to provider.  Patient was contacted by provider. Patient reports no issues or concerns regarding her medication. Patient is currently taking Lamictal 25 mg daily. Patient inquired about increasing the dosage of her Lamictal if she continues to tolerate the medication. Provider informed patient that any increase in her medication dosage would have to be performed 2 weeks after starting the medication.  Patient also inquired about trazodone dose. Patient was originally prescribed trazodone 50 mg from her previous psychiatric provider and was informed to take up to 200 mg for the management of her sleep. She reports that she has never taken that high of a dose and states that she has been able to get by with 100 mg at bedtime. Patient is requesting a prescription for trazodone 100 mg at bedtime for the management of her sleep. Patient's medication to be prescribed to pharmacy of choice. Patient was informed of the potential of trazodone to be an arrhythmogenic.

## 2020-09-17 ENCOUNTER — Ambulatory Visit: Payer: Medicare Other | Admitting: Critical Care Medicine

## 2020-09-18 ENCOUNTER — Telehealth (HOSPITAL_COMMUNITY): Payer: Self-pay | Admitting: Physician Assistant

## 2020-09-18 NOTE — Telephone Encounter (Signed)
Pt called requesting return call for instructions of medication dosage.  Pt states provider discussed increasing LAMICTAL 25 MG to 50MG  staring this Friday, 09/21/20. Pt says she is awaiting instructions from provider and a new prescription to be sent to the pharmacy.

## 2020-09-20 ENCOUNTER — Other Ambulatory Visit (HOSPITAL_COMMUNITY): Payer: Self-pay | Admitting: Physician Assistant

## 2020-09-20 DIAGNOSIS — F3160 Bipolar disorder, current episode mixed, unspecified: Secondary | ICD-10-CM

## 2020-09-20 MED ORDER — LAMOTRIGINE 25 MG PO TABS: 50.0000 mg | ORAL_TABLET | Freq: Every day | ORAL | 1 refills | Status: DC

## 2020-09-20 NOTE — Progress Notes (Signed)
Provider reached out to patient to discuss medication management. Patient was informed that her lamictal dosage would be increased from 25 mg to 50 mg daily. Patient was advised to start taking Lamictal 50 mg daily on 09/21/2020. Patient's medication was e-prescribed to pharmacy of choice.  Patient informed provider that she is unsure if she has been experiencing episodes of delusions. Patient states that she feels she is being followed at times. She also states that she feels that people are looking at her and making signals/gestures. Medications to be explored for the patient.  Patient has follow up appointment scheduled for 10/05/2020

## 2020-09-21 NOTE — Telephone Encounter (Signed)
Patient was contacted and medication dosage was adjusted and e-prescribed to pharmacy of choice.

## 2020-09-24 ENCOUNTER — Encounter: Payer: Self-pay | Admitting: Hematology and Oncology

## 2020-09-24 DIAGNOSIS — F4310 Post-traumatic stress disorder, unspecified: Secondary | ICD-10-CM | POA: Diagnosis not present

## 2020-09-24 DIAGNOSIS — F422 Mixed obsessional thoughts and acts: Secondary | ICD-10-CM | POA: Diagnosis not present

## 2020-09-24 DIAGNOSIS — F3181 Bipolar II disorder: Secondary | ICD-10-CM | POA: Diagnosis not present

## 2020-10-02 ENCOUNTER — Encounter (HOSPITAL_COMMUNITY): Payer: Self-pay

## 2020-10-03 DIAGNOSIS — F3181 Bipolar II disorder: Secondary | ICD-10-CM | POA: Diagnosis not present

## 2020-10-03 DIAGNOSIS — F422 Mixed obsessional thoughts and acts: Secondary | ICD-10-CM | POA: Diagnosis not present

## 2020-10-03 DIAGNOSIS — F4310 Post-traumatic stress disorder, unspecified: Secondary | ICD-10-CM | POA: Diagnosis not present

## 2020-10-04 ENCOUNTER — Encounter (HOSPITAL_COMMUNITY): Payer: Self-pay | Admitting: Emergency Medicine

## 2020-10-04 ENCOUNTER — Telehealth (HOSPITAL_COMMUNITY): Payer: Self-pay | Admitting: *Deleted

## 2020-10-04 ENCOUNTER — Other Ambulatory Visit: Payer: Self-pay

## 2020-10-04 ENCOUNTER — Emergency Department (HOSPITAL_COMMUNITY)
Admission: EM | Admit: 2020-10-04 | Discharge: 2020-10-04 | Disposition: A | Discharge status: Home / Self Care | Payer: Medicare Other | Attending: Emergency Medicine | Admitting: Emergency Medicine

## 2020-10-04 DIAGNOSIS — J45909 Unspecified asthma, uncomplicated: Secondary | ICD-10-CM | POA: Diagnosis not present

## 2020-10-04 DIAGNOSIS — F17210 Nicotine dependence, cigarettes, uncomplicated: Secondary | ICD-10-CM | POA: Insufficient documentation

## 2020-10-04 DIAGNOSIS — L239 Allergic contact dermatitis, unspecified cause: Secondary | ICD-10-CM | POA: Diagnosis not present

## 2020-10-04 DIAGNOSIS — L259 Unspecified contact dermatitis, unspecified cause: Secondary | ICD-10-CM

## 2020-10-04 DIAGNOSIS — R21 Rash and other nonspecific skin eruption: Secondary | ICD-10-CM | POA: Diagnosis present

## 2020-10-04 DIAGNOSIS — Z853 Personal history of malignant neoplasm of breast: Secondary | ICD-10-CM | POA: Insufficient documentation

## 2020-10-04 DIAGNOSIS — Z7951 Long term (current) use of inhaled steroids: Secondary | ICD-10-CM | POA: Insufficient documentation

## 2020-10-04 DIAGNOSIS — J449 Chronic obstructive pulmonary disease, unspecified: Secondary | ICD-10-CM | POA: Insufficient documentation

## 2020-10-04 NOTE — ED Provider Notes (Addendum)
Emergency Medicine Provider Triage Evaluation Note  Catherine Olsen , a 50 y.o. female  was evaluated in triage.  Pt complains of rash.  Patient reports a pruritic rash to her scalp.  She first noticed the rash today.  She reports that she was started on lamotrigine about a month ago.  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  She called the after-hours nurse triage line and was advised to come to the emergency department for further evaluation.  She denies fever, chills, shortness of breath, erythema, warmth, or swelling noted to the skin.  No treatment prior to arrival.  Denies new soaps, lotions, hair products, or recently dying her hair.  Lamotrigine dose was recently increased about 2 weeks ago.  Review of Systems  Positive: Rash Negative: Fever, chills, shortness of breath, erythema, warmth, swelling to the skin  Physical Exam  BP (!) 133/100 (BP Location: Right Arm)   Pulse 71   Resp 16   LMP 11/21/2014   SpO2 98%  Gen:   Awake, no distress   Resp:  Normal effort  MSK:   Moves extremities without difficulty  Other:  Blanching erythematous macular papular rash noted to the base of the scalp.  No desquamation, red streaking, fluctuance.   Medical Decision Making  Medically screening exam initiated at 1:22 AM.  Appropriate orders placed.  Catherine Olsen was informed that the remainder of the evaluation will be completed by another provider, this initial triage assessment does not replace that evaluation, and the importance of remaining in the ED until their evaluation is complete.  The patient will require further work-up and evaluation in the emergency department.   Catherine Gavel, PA-C 10/04/20 0122    Catherine Gavel, PA-C 10/10/20 0355    Randal Buba, April, MD 10/10/20 [id number]

## 2020-10-04 NOTE — Telephone Encounter (Signed)
Patient called and stated she had developed a rash from Lamictal.  She was seen at ED and Lamictal was discontinued.  She would like to speak with you today.

## 2020-10-04 NOTE — Discharge Instructions (Addendum)
Use hydrocortisone cream

## 2020-10-04 NOTE — ED Triage Notes (Signed)
Pt c/o rash on the back of her hair that started today. Pt feels like this started after she took Lamictal.

## 2020-10-04 NOTE — ED Notes (Signed)
Patient verbalizes understanding of discharge instructions. Opportunity for questioning and answers were provided. Armband removed by staff, pt discharged from ED ambulatory.   

## 2020-10-04 NOTE — ED Provider Notes (Signed)
[facility] EMERGENCY DEPARTMENT Provider Note   CSN: [id number] Arrival date & time: 10/04/20  7858     History Chief Complaint  Patient presents with   Rash    Catherine Olsen is a 50 y.o. female.  The history is provided by the patient.  Rash Location: 1 inch patch at base of the scalp. Quality: itchiness   Severity:  Mild Onset quality:  Gradual Duration:  1 day Timing:  Constant Progression:  Unchanged Chronicity:  New Context: not animal contact, not chemical exposure, not diapers, not eggs, not exposure to similar rash and not food   Relieved by:  Nothing Worsened by:  Nothing Ineffective treatments:  None tried Associated symptoms: no fever, no headaches, no hoarse voice, no induration, no joint pain, no myalgias, no nausea, no periorbital edema, no shortness of breath, no sore throat, no throat swelling, no tongue swelling and not wheezing   Associated symptoms comment:  No oral lesions Told to come in by nurse line because she is on lamictal.  States she cannot take anti-histamines     Past Medical History:  Diagnosis Date   Abnormal uterine bleeding (AUB) 06/25/2009   Qualifier: Diagnosis of  By: Cathren Laine MD, Ankit     Allergy    Angio-edema    Anxiety    Arthritis    hands, lower back, knees   Asthma    Bipolar 1 disorder (HCC)    Breast cancer (HCC)    stage 0   Chronic prescription benzodiazepine use 09/08/2018   COPD (chronic obstructive pulmonary disease) (HCC)    Depression    Endometriosis    Fibromyalgia    GERD (gastroesophageal reflux disease)    Headache(784.0)    otc med prn   Migraine    Pituitary tumor    microadenoma   PONV (postoperative nausea and vomiting)    PTSD (post-traumatic stress disorder)    Scoliosis    Termination of pregnancy    x 2 at age 86 and 50 yrs old   Tobacco abuse 03/04/2015   Urticaria     Patient Active Problem List   Diagnosis Date Noted   Bipolar 1 disorder, depressed, severe ([place])  08/12/2020   Breast cancer ([place])    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   History of suicidal ideation 09/08/2018   Other allergic rhinitis 02/02/2018   Allergy, unspecified, subsequent encounter 02/02/2018   Mild intermittent asthma without complication 85/05/7739   Angio-edema 02/02/2018   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   Gastroesophageal reflux disease without esophagitis 02/02/2018   Chronic dental pain 01/20/2018   Chronic facial pain 01/20/2018   Prolonged Q-T interval on ECG 01/02/2018   Environmental allergies 10/29/2017   Pain in joint of left shoulder 07/15/2017   Neck pain 07/15/2017   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   Chronic migraine without aura without status migrainosus, not intractable 03/24/2017   Chronic midline low back pain without sciatica 03/24/2017   Pulmonary emphysema ([place]) 02/19/2017   Jaw pain 02/11/2017   Smoking history 12/09/2016   Exertional dyspnea 12/09/2016   Family history of alpha 1 antitrypsin deficiency 09/04/2016   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   Nodule of left lung 09/04/2016   Cervical lymphadenopathy 01/29/2016   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[place])    Perimenopausal vasomotor symptoms 05/25/2015   Chronic rhinitis 04/18/2015   Pituitary microadenoma ([place]) 05/19/2014   Endometriosis of ovary 04/03/2014   Ovarian cyst, right 04/03/2014   Posttraumatic stress disorder 09/13/2012  Fibrocystic breast disease 08/24/2012   Myalgia and myositis 07/26/2012   Depression 07/26/2012   PTSD (post-traumatic stress disorder) 01/09/2009   Insomnia 01/17/2008   NEVI, MULTIPLE 07/17/2006   KERATOSIS, SEBORRHEIC NEC 07/17/2006   ACNE NEC 07/17/2006    Past Surgical History:  Procedure Laterality Date   BREAST BIOPSY Right 05/19/2007   BREAST BIOPSY Right 06/02/2007   BREAST BIOPSY  06/29/2019   BREAST EXCISIONAL BIOPSY     BREAST LUMPECTOMY Right 07/21/2019   BREAST LUMPECTOMY WITH RADIOACTIVE SEED  LOCALIZATION Right 07/21/2019   Procedure: RIGHT BREAST LUMPECTOMY WITH RADIOACTIVE SEED LOCALIZATION;  Surgeon: Alphonsa Overall, MD;  Location: [facility];  Service: General;  Laterality: Right;   BREAST SURGERY     DILATION AND CURETTAGE OF UTERUS     FACIAL COSMETIC SURGERY     right cheek   LAPAROSCOPY Right 11/11/2013   Procedure: LAPAROSCOPY OPERATIVE with  Drainage of  RIGHT Ovarian ENDOMETRIOMA;  Surgeon: Elveria Royals, MD;  Location: [facility];  Service: Gynecology;  Laterality: Right;   NASAL ENDOSCOPY     said it showed some acid reflux from ENT   NOSE SURGERY     rhinoplasty at age 16 yrs   WISDOM TOOTH EXTRACTION       OB History     Gravida  2   Para      Term      Preterm      AB  2   Living  0      SAB      IAB  2   Ectopic      Multiple      Live Births              Family History  Problem Relation Age of Onset   Diabetes Mother    Hypertension Mother    Stroke Mother 46       CVA   Mental illness Mother        no diagnosis; personality disorder   Hyperlipidemia Father    Hypertension Father    COPD Father    Alpha-1 antitrypsin deficiency Father    Asthma Father    Alpha-1 antitrypsin deficiency Brother    Asthma Brother    Diabetes Maternal Grandmother    Heart disease Maternal Grandmother    Hyperlipidemia Maternal Grandmother    Hypertension Maternal Grandmother    Mental illness Maternal Grandmother    Heart disease Maternal Grandfather    Hyperlipidemia Maternal Grandfather    Hypertension Maternal Grandfather    Asthma Maternal Grandfather    Heart disease Paternal Grandfather    Hyperlipidemia Paternal Grandfather    Hypertension Paternal Grandfather    Stroke Paternal Grandfather    Alzheimer's disease Paternal Grandmother    Allergic rhinitis Neg Hx    Angioedema Neg Hx    Eczema Neg Hx    Urticaria Neg Hx    Immunodeficiency Neg Hx    Colon cancer Neg Hx    Esophageal cancer Neg Hx     Social  History   Tobacco Use   Smoking status: Every Day    Packs/day: 0.50    Years: 29.00    Pack years: 14.50    Types: Cigarettes    Last attempt to quit: 10/08/2019    Years since quitting: 0.9   Smokeless tobacco: Never  Vaping Use   Vaping Use: Former  Substance Use Topics   Alcohol use: Yes    Alcohol/week: 2.0 standard drinks  Types: 2 Cans of beer per week    Comment: 6 nights   Drug use: Not Currently    Types: "Crack" cocaine    Comment: 07/19/2019    Home Medications Prior to Admission medications   Medication Sig Start Date End Date Taking? Authorizing Provider  albuterol (VENTOLIN HFA) 108 (90 Base) MCG/ACT inhaler Inhale 2 puffs into the lungs every 6 (six) hours as needed for wheezing or shortness of breath. 05/10/20   Elsie Stain, MD  EPINEPHrine 0.3 mg/0.3 mL IJ SOAJ injection Inject 0.3 mg into the skin as needed for anaphylaxis. 05/10/20   Elsie Stain, MD  fluticasone (FLONASE) 50 MCG/ACT nasal spray Place 1 spray into both nostrils daily. 08/11/20   [provider]  lamoTRIgine (LAMICTAL) 25 MG tablet Take 2 tablets (50 mg total) by mouth daily. 09/20/20 09/20/21  Nwoko, Terese Door, PA  tamoxifen (NOLVADEX) 20 MG tablet Take 1 tablet (20 mg total) by mouth daily. For breast cancer 08/12/20   Lindell Spar I, NP  traZODone (DESYREL) 100 MG tablet Take 1 tablet (100 mg total) by mouth at bedtime. 09/12/20   Nwoko, Terese Door, PA    Allergies    Other, Prednisone, Sulfamethoxazole, Oxycodone, Abilify [aripiprazole], Celebrex [celecoxib], Molds & smuts, Penicillins, Tylenol [acetaminophen], Percocet [oxycodone-acetaminophen], Sulfa antibiotics, and Sulfites  Review of Systems   Review of Systems  Constitutional:  Negative for fever.  HENT:  Negative for congestion, drooling, facial swelling, hoarse voice, sore throat, trouble swallowing and voice change.   Eyes:  Negative for photophobia.  Respiratory:  Negative for shortness of breath and wheezing.    Cardiovascular:  Negative for leg swelling.  Gastrointestinal:  Negative for nausea.  Genitourinary:  Negative for difficulty urinating.  Musculoskeletal:  Negative for arthralgias and myalgias.  Skin:  Positive for rash. Negative for wound.  Neurological:  Negative for headaches.  Psychiatric/Behavioral:  Negative for agitation.   All other systems reviewed and are negative.  Physical Exam Updated Vital Signs BP (!) 133/100 (BP Location: Right Arm)   Pulse 71   Resp 16   LMP 11/21/2014   SpO2 98%   Physical Exam Vitals and nursing note reviewed.  Constitutional:      General: She is not in acute distress.    Appearance: Normal appearance.  HENT:     Head: Normocephalic and atraumatic.     Nose: Nose normal.     Mouth/Throat:     Mouth: Mucous membranes are moist.     Pharynx: Oropharynx is clear.  Eyes:     Conjunctiva/sclera: Conjunctivae normal.     Pupils: Pupils are equal, round, and reactive to light.  Cardiovascular:     Rate and Rhythm: Normal rate and regular rhythm.     Pulses: Normal pulses.     Heart sounds: Normal heart sounds.  Pulmonary:     Effort: Pulmonary effort is normal.     Breath sounds: Normal breath sounds.  Abdominal:     General: Abdomen is flat. Bowel sounds are normal.     Palpations: Abdomen is soft.     Tenderness: There is no abdominal tenderness. There is no guarding.  Musculoskeletal:        General: Normal range of motion.     Cervical back: Normal range of motion and neck supple.  Skin:    General: Skin is warm and dry.     Capillary Refill: Capillary refill takes less than 2 seconds.  Comments: No lesions in the mouth nor on the rest of the body   Neurological:     General: No focal deficit present.     Mental Status: She is alert and oriented to person, place, and time.     Deep Tendon Reflexes: Reflexes normal.  Psychiatric:        Mood and Affect: Mood normal.        Behavior: Behavior normal.    ED Results  / Procedures / Treatments   Labs (all labs ordered are listed, but only abnormal results are displayed) Labs Reviewed - No data to display  EKG None  Radiology No results found.  Procedures Procedures   Medications Ordered in ED Medications - No data to display  ED Course  I have reviewed the triage vital signs and the nursing notes.  Pertinent labs & imaging results that were available during my care of the patient were reviewed by me and considered in my medical decision making (see chart for details).   This is not erythema multi forme and it is not Kathreen Cosier.  There aren't any lesions on the skin.  Just an area the patient has scratched.  Stable for discharge with close follow up.  Catherine Olsen was evaluated in Emergency Department on 10/04/2020 for the symptoms described in the history of present illness. She was evaluated in the context of the global COVID-19 pandemic, which necessitated consideration that the patient might be at risk for infection with the SARS-CoV-2 virus that causes COVID-19. Institutional protocols and algorithms that pertain to the evaluation of patients at risk for COVID-19 are in a state of rapid change based on information released by regulatory bodies including the CDC and federal and state organizations. These policies and algorithms were followed during the patient's care in the ED.    Final Clinical Impression(s) / ED Diagnoses Final diagnoses:  Contact dermatitis, unspecified contact dermatitis type, unspecified trigger    Return for intractable cough, coughing up blood, fevers > 100.4 unrelieved by medication, shortness of breath, intractable vomiting, chest pain, shortness of breath, weakness, numbness, changes in speech, facial asymmetry, abdominal pain, passing out, Inability to tolerate liquids or food, cough, altered mental status or any concerns. No signs of systemic illness or infection. The patient is nontoxic-appearing on exam and  vital signs are within normal limits. I have reviewed the triage vital signs and the nursing notes. Pertinent labs & imaging results that were available during my care of the patient were reviewed by me and considered in my medical decision making (see chart for details). After history, exam, and medical workup I feel the patient has been appropriately medically screened and is safe for discharge home. Pertinent diagnoses were discussed with the patient. Patient was given return precautions.  Rx / DC Orders ED Discharge Orders     None        Augusten Lipkin, MD 10/04/20 [phone]

## 2020-10-05 ENCOUNTER — Telehealth (INDEPENDENT_AMBULATORY_CARE_PROVIDER_SITE_OTHER): Payer: Medicare Other | Admitting: Physician Assistant

## 2020-10-05 ENCOUNTER — Encounter (HOSPITAL_COMMUNITY): Payer: Self-pay | Admitting: Physician Assistant

## 2020-10-05 DIAGNOSIS — F4310 Post-traumatic stress disorder, unspecified: Secondary | ICD-10-CM | POA: Diagnosis not present

## 2020-10-05 DIAGNOSIS — G4700 Insomnia, unspecified: Secondary | ICD-10-CM

## 2020-10-05 DIAGNOSIS — F3160 Bipolar disorder, current episode mixed, unspecified: Secondary | ICD-10-CM | POA: Diagnosis not present

## 2020-10-05 NOTE — Progress Notes (Signed)
Catherine Olsen OP Progress Note  Virtual Visit via Telephone Note  I connected with Catherine Olsen on 10/05/20 at  3:30 PM EDT by telephone and verified that I am speaking with the correct person using two identifiers.  Location: Patient: Home Provider: Clinic   I discussed the limitations, risks, security and privacy concerns of performing an evaluation and management service by telephone and the availability of in person appointments. I also discussed with the patient that there may be a patient responsible charge related to this service. The patient expressed understanding and agreed to proceed.  Follow Up Instructions:  I discussed the assessment and treatment plan with the patient. The patient was provided an opportunity to ask questions and all were answered. The patient agreed with the plan and demonstrated an understanding of the instructions.   The patient was advised to call back or seek an in-person evaluation if the symptoms worsen or if the condition fails to improve as anticipated.  I provided 30 minutes of non-face-to-face time during this encounter.  Malachy Mood, PA    10/05/2020 9:47 PM DILAN FULLENWIDER  MRN:  [id number]  Chief Complaint: Follow up and medication management  HPI:   Catherine Olsen is a 50 year old female with a past psychiatric history significant for bipolar disorder, insomnia, and PTSD who presents to [facility] via virtual telephone visit for follow-up and medication management.  Patient is currently being managed on the following medications:  Trazodone 100 mg at bedtime Lamictal 50 mg daily  Patient reports that she is doing horrible.  Patient states that she was recently in the ED due to developing a rash on her forehead.  Patient described the rash as having multiple welts. When assessed in the ED, patient states that she was advised to discontinue taking her Lamictal due to potential adverse reaction.  Although  patient has discontinued taking Lamictal, she reports that Lamictal was only slightly helpful in the management of her symptoms.  Patient reports that she has been unwell and had suicidal ideations with thoughts of wanting to actively hang herself.  Patient reports that she needs to be in a hospital setting for long term care.  Patient was informed that she could receive care at [facility] if she was unstable and felt like a danger to herself.  Patient states that she has received services from [facility] in the past and felt that her needs  weren't addressed.  Patient reports that she cannot function anymore and is actively looking for long-term placement in a psychiatric facility.  Patient endorses the following depressive symptoms: lack of interest in activities, depressed mood, difficulty falling asleep, fatigue, and poor appetite.  A PHQ-9 screen was performed with the patient scoring a 21.  A [place] Suicide Severity Risk Scale was utilized with the patient being considered high risk.  Patient denies being a danger to herself and is able to contract for safety following the conclusion of the encounter.  Patient is alert and oriented x4.  Patient is irritable and exhibits tangential thought process during the majority of the encounter.  Patient is able to answer all questions addressed to her.  Patient denies suicidal or homicidal ideations, however, she does express that she had active suicidal thoughts with a plan prior to her encounter today.  Patient denies auditory or visual hallucinations and does not appear to be responding to internal/external stimuli.  Patient expresses that her condition is worsening and that she often  feels agoraphobic.  Patient endorses poor sleep and appetite.  Patient denies alcohol use, tobacco use, and illicit drug use.  Visit Diagnosis:    ICD-10-CM   1. 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HCC)  F31.60     2. Insomnia, unspecified type  G47.00     3. PTSD  (post-traumatic stress disorder)  F43.10       Past Psychiatric History:  Bipolar I disorder Insomnia PTSD  Past Medical History:  Past Medical History:  Diagnosis Date   Abnormal uterine bleeding (AUB) 06/25/2009   Qualifier: Diagnosis of  By: Cathren Laine MD, Ankit     Allergy    Angio-edema    Anxiety    Arthritis    hands, lower back, knees   Asthma    Bipolar 1 disorder ([place])    Breast cancer ([place])    stage 0   Chronic prescription benzodiazepine use 09/08/2018   COPD (chronic obstructive pulmonary disease) (HCC)    Depression    Endometriosis    Fibromyalgia    GERD (gastroesophageal reflux disease)    Headache(784.0)    otc med prn   Migraine    Pituitary tumor    microadenoma   PONV (postoperative nausea and vomiting)    PTSD (post-traumatic stress disorder)    Scoliosis    Termination of pregnancy    x 2 at age 25 and 50 yrs old   Tobacco abuse 03/04/2015   Urticaria     Past Surgical History:  Procedure Laterality Date   BREAST BIOPSY Right 05/19/2007   BREAST BIOPSY Right 06/02/2007   BREAST BIOPSY  06/29/2019   BREAST EXCISIONAL BIOPSY     BREAST LUMPECTOMY Right 07/21/2019   BREAST LUMPECTOMY WITH RADIOACTIVE SEED LOCALIZATION Right 07/21/2019   Procedure: RIGHT BREAST LUMPECTOMY WITH RADIOACTIVE SEED LOCALIZATION;  Surgeon: Alphonsa Overall, MD;  Location: [facility];  Service: General;  Laterality: Right;   BREAST SURGERY     DILATION AND CURETTAGE OF UTERUS     FACIAL COSMETIC SURGERY     right cheek   LAPAROSCOPY Right 11/11/2013   Procedure: LAPAROSCOPY OPERATIVE with  Drainage of  RIGHT Ovarian ENDOMETRIOMA;  Surgeon: Elveria Royals, MD;  Location: [facility];  Service: Gynecology;  Laterality: Right;   NASAL ENDOSCOPY     said it showed some acid reflux from ENT   NOSE SURGERY     rhinoplasty at age 66 yrs   WISDOM TOOTH EXTRACTION      Family Psychiatric History:  Patient reports that her Grandmother had Alzheimer's and her father  currently has Alzheimer's. Patient states that her great grandmother was hospitalized for manic/depressive behavior. Patient states that her grandmother was also manic depressive.  Family History:  Family History  Problem Relation Age of Onset   Diabetes Mother    Hypertension Mother    Stroke Mother 7       CVA   Mental illness Mother        no diagnosis; personality disorder   Hyperlipidemia Father    Hypertension Father    COPD Father    Alpha-1 antitrypsin deficiency Father    Asthma Father    Alpha-1 antitrypsin deficiency Brother    Asthma Brother    Diabetes Maternal Grandmother    Heart disease Maternal Grandmother    Hyperlipidemia Maternal Grandmother    Hypertension Maternal Grandmother    Mental illness Maternal Grandmother    Heart disease Maternal Grandfather    Hyperlipidemia Maternal Grandfather    Hypertension  Maternal Grandfather    Asthma Maternal Grandfather    Heart disease Paternal Grandfather    Hyperlipidemia Paternal Grandfather    Hypertension Paternal Grandfather    Stroke Paternal Grandfather    Alzheimer's disease Paternal Grandmother    Allergic rhinitis Neg Hx    Angioedema Neg Hx    Eczema Neg Hx    Urticaria Neg Hx    Immunodeficiency Neg Hx    Colon cancer Neg Hx    Esophageal cancer Neg Hx     Social History:  Social History   Socioeconomic History   Marital status: Divorced    Spouse name: Not on file   Number of children: 0   Years of education: Not on file   Highest education level: Bachelor's degree (e.g., BA, AB, BS)  Occupational History   Occupation: disability    Comment: mental illness  Tobacco Use   Smoking status: Every Day    Packs/day: 0.50    Years: 29.00    Pack years: 14.50    Types: Cigarettes    Last attempt to quit: 10/08/2019    Years since quitting: 1.0   Smokeless tobacco: Never  Vaping Use   Vaping Use: Former  Substance and Sexual Activity   Alcohol use: Yes    Alcohol/week: 2.0 standard drinks     Types: 2 Cans of beer per week    Comment: 6 nights   Drug use: Not Currently    Types: "Crack" cocaine    Comment: 07/19/2019   Sexual activity: Not Currently    Comment: abortion at 27 and 72yrs. of age   Other Topics Concern   Not on file  Social History Narrative   Marital status: divorced; not dating      Children: none      Lives: alone with mom      Employment: disability for mental illness in 7.  Hospitalizations x 9 in past.      Tobacco: 1 ppd x since age 36. Decreasing in 2018.      Alcohol: socially; beers.      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      Exercise: yoga daily; exercise daily.      ADLs: independent with ADLs; no car; depends on others for transportation.      Patient is right-handed. She is divorced and lives with her mother. She drinks 2-3 cups of 1/2 caffeine coffe a day. She walks occasionally for exercise.   Social Determinants of Health   Financial Resource Strain: Not on file  Food Insecurity: Not on file  Transportation Needs: Not on file  Physical Activity: Not on file  Stress: Not on file  Social Connections: Not on file    Allergies:  Allergies  Allergen Reactions   Other Anaphylaxis    Idiopathic (possible binder or preservatives in medication)     Prednisone Anaphylaxis    Steroids Steroids ( can take with benadryl )   Can not take higher than 40 mg    Sulfamethoxazole Anaphylaxis   Oxycodone Other (See Comments)    Severe blindness?   Abilify [Aripiprazole]     Generic Abilify--causes severe neurological problems. Can use name brand   Celebrex [Celecoxib]    Molds & Smuts Other (See Comments)    Environmental   Penicillins Hives    Has patient had a PCN reaction causing immediate rash, facial/tongue/throat swelling, SOB or lightheadedness with hypotension: no Has patient had a PCN reaction causing severe rash  involving mucus membranes or skin necrosis: NO Has patient had a PCN reaction that  required hospitalizationNO Has patient had a PCN reaction occurring within the last 10 years:NO If all of the above answers are "NO", then may proceed with Cephalosporin use.    Tylenol [Acetaminophen]     Stomach pain   Lamictal [Lamotrigine] Rash   Percocet [Oxycodone-Acetaminophen]     Vision loss   Sulfa Antibiotics Hives    Pt states she is not really allergic to sulfa, but to sulfites.   Sulfites Other (See Comments)    unknown    Metabolic Disorder Labs: Lab Results  Component Value Date   HGBA1C 5.3 08/12/2020   MPG 105.41 08/12/2020   MPG 97 01/21/2020   Lab Results  Component Value Date   PROLACTIN 8.2 08/12/2020   PROLACTIN 12.0 06/20/2020   Lab Results  Component Value Date   CHOL 167 08/12/2020   TRIG 73 08/12/2020   HDL 63 08/12/2020   CHOLHDL 2.7 08/12/2020   VLDL 15 08/12/2020   LDLCALC 89 08/12/2020   LDLCALC 83 05/10/2020   Lab Results  Component Value Date   TSH 2.650 08/12/2020   TSH 0.828 01/21/2020    Therapeutic Level Labs: No results found for: LITHIUM Lab Results  Component Value Date   VALPROATE 50 01/21/2020   No components found for:  CBMZ  Current Medications: Current Outpatient Medications  Medication Sig Dispense Refill   albuterol (VENTOLIN HFA) 108 (90 Base) MCG/ACT inhaler Inhale 2 puffs into the lungs every 6 (six) hours as needed for wheezing or shortness of breath. 8 g 3   EPINEPHrine 0.3 mg/0.3 mL IJ SOAJ injection Inject 0.3 mg into the skin as needed for anaphylaxis. 1 each 0   fluticasone (FLONASE) 50 MCG/ACT nasal spray Place 1 spray into both nostrils daily.     ibuprofen (ADVIL) 800 MG tablet Take 1 tablet (800 mg total) by mouth every 8 (eight) hours as needed. 90 tablet 1   lamoTRIgine (LAMICTAL) 25 MG tablet Take 2 tablets (50 mg total) by mouth daily. 60 tablet 1   tamoxifen (NOLVADEX) 20 MG tablet Take 1 tablet (20 mg total) by mouth daily. For breast cancer     traZODone (DESYREL) 100 MG tablet Take 1 tablet  (100 mg total) by mouth at bedtime. 30 tablet 1   No current facility-administered medications for this visit.     Musculoskeletal: Strength & Muscle Tone: Unable to assess due to telemedicine visit [place]: Unable to assess due to telemedicine visit Patient leans: Unable to assess due to telemedicine visit  Psychiatric Specialty Exam: Review of Systems  Psychiatric/Behavioral:  Positive for agitation, dysphoric mood and sleep disturbance. Negative for decreased concentration, hallucinations, self-injury and suicidal ideas. The patient is nervous/anxious. The patient is not hyperactive.    Last menstrual period 11/21/2014.There is no height or weight on file to calculate BMI.  General Appearance: Unable to assess due to telemedicine visit  Eye Contact:  Unable to assess due to telemedicine visit  Speech:  Clear and Coherent and Normal Rate  Volume:  Increased  Mood:  Anxious, Depressed, Dysphoric, and Irritable  Affect:  Congruent, Depressed, and Labile  Thought Process:  Coherent and Descriptions of Associations: Tangential  Orientation:  Full (Time, Place, and Person)  Thought Content: Illogical, Delusions, Paranoid Ideation, and Tangential   Suicidal Thoughts:  No  Homicidal Thoughts:  No  Memory:  Immediate;   Good Recent;   Fair Remote;   Fair  Judgement:  Fair  Insight:  Fair  Psychomotor Activity:  Restlessness  Concentration:  Concentration: Fair and Attention Span: Good  Recall:  [place] of Knowledge: Good  Language: Fair  Akathisia:  NA  Handed:  Right  AIMS (if indicated): not done  Assets:  Communication Skills Desire for Improvement Housing  ADL's:  Intact  Cognition: WNL  Sleep:  Poor   Screenings: AIMS    Flowsheet Row Admission (Discharged) from 08/12/2020 in [facility] [facility] Admission (Discharged) from 09/08/2018 in [place] Total Score 0 0      AUDIT    [place] Admission  (Discharged) from 08/12/2020 in [place] [facility] Admission (Discharged) from 09/08/2018 in [place] Admission (Discharged) from 09/12/2012 in [place] [facility]  Alcohol Use Disorder Identification Test Final Score (AUDIT) 0 0 0      GAD-7    Flowsheet Row Office Visit from 09/07/2020 in [facility]  Total GAD-7 Score 17      PHQ2-9    Flowsheet Row Video Visit from 10/05/2020 in [facility] Office Visit from 09/07/2020 in [facility] ED from 12/09/2019 in [facility] Office Visit from 12/01/2018 in [facility] [place] Visit from 08/10/2018 in [facility]  PHQ-2 Total Score 6 6 6 2  0  PHQ-9 Total Score 21 20 22 9  --      Flowsheet Row Video Visit from 10/05/2020 in [facility] Office Visit from 09/07/2020 in [facility] Admission (Discharged) from 08/12/2020 in [place] [facility]  C-SSRS RISK CATEGORY High Risk Moderate Risk High Risk        Assessment and Plan:   Carolena L. Schwering is a 50 year old female with a past psychiatric history significant for bipolar disorder, insomnia, and PTSD who presents to [facility] via virtual telephone visit for follow-up and medication management.  Patient is irritable during most of the encounter due to feeling like she is not receiving the help she needs.  Patient is adamant on being placed in a long-term care facility for the management of her PTSD.  Patient refuses medication management due to fear of cross-reactivity with her cancer medication as well as being unsuccessful with multiple medications in the past.  Patient was considered high risk through the use of  the [place] Suicide Severity Rating  Scale.  Patient denies being a danger to herself and states that she will be calling her father and stepmother soon to spend the weekend with them.  Due to patient being considered high risk with the [place], a wellness checkup was initiated after the conclusion of the encounter.  1. 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[place]) Lamictal discontinued due to patient experiencing a rash Patient refuses medication management at this time  2. Insomnia, unspecified type Patient refuses medication management at this time  3. PTSD (post-traumatic stress disorder)   Patient to follow up in 2 months A total of 30 minutes was spent with the patient/reviewing patient's chart  Malachy Mood, PA 10/05/2020, 9:47 PM

## 2020-10-05 NOTE — Telephone Encounter (Signed)
Patient has a virtual appointment scheduled for today. Provider will discuss alternative medication options with the patient during today's encounter.

## 2020-10-09 ENCOUNTER — Telehealth (HOSPITAL_COMMUNITY): Payer: Self-pay | Admitting: Physician Assistant

## 2020-10-09 NOTE — Telephone Encounter (Signed)
Patient contacted office to follow up with provider about changes to medication regimen. Patient stated that provider stated that he'd give her a call back after researching and determining best medications for her. Writer informed patient that her provider is currently with scheduled patients and this message will be responded to at earliest convenience. Patient stated that she is at a family members house and she does not have good phone service. Patient requested provider leave detailed voice message if she is unable to receive phone call.
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
## 2020-10-11 ENCOUNTER — Emergency Department (HOSPITAL_BASED_OUTPATIENT_CLINIC_OR_DEPARTMENT_OTHER): Payer: No Typology Code available for payment source | Admitting: Radiology

## 2020-10-11 ENCOUNTER — Other Ambulatory Visit: Payer: Self-pay

## 2020-10-11 ENCOUNTER — Emergency Department (HOSPITAL_BASED_OUTPATIENT_CLINIC_OR_DEPARTMENT_OTHER): Payer: No Typology Code available for payment source

## 2020-10-11 ENCOUNTER — Encounter (HOSPITAL_BASED_OUTPATIENT_CLINIC_OR_DEPARTMENT_OTHER): Payer: Self-pay

## 2020-10-11 ENCOUNTER — Emergency Department (HOSPITAL_BASED_OUTPATIENT_CLINIC_OR_DEPARTMENT_OTHER)
Admission: EM | Admit: 2020-10-11 | Discharge: 2020-10-11 | Disposition: A | Discharge status: Home / Self Care | Payer: No Typology Code available for payment source | Attending: Emergency Medicine | Admitting: Emergency Medicine

## 2020-10-11 DIAGNOSIS — J4520 Mild intermittent asthma, uncomplicated: Secondary | ICD-10-CM | POA: Insufficient documentation

## 2020-10-11 DIAGNOSIS — R519 Headache, unspecified: Secondary | ICD-10-CM | POA: Diagnosis not present

## 2020-10-11 DIAGNOSIS — M25512 Pain in left shoulder: Secondary | ICD-10-CM | POA: Insufficient documentation

## 2020-10-11 DIAGNOSIS — F17210 Nicotine dependence, cigarettes, uncomplicated: Secondary | ICD-10-CM | POA: Diagnosis not present

## 2020-10-11 DIAGNOSIS — Z041 Encounter for examination and observation following transport accident: Secondary | ICD-10-CM | POA: Diagnosis not present

## 2020-10-11 DIAGNOSIS — Y92410 Unspecified street and highway as the place of occurrence of the external cause: Secondary | ICD-10-CM | POA: Insufficient documentation

## 2020-10-11 DIAGNOSIS — S134XXA Sprain of ligaments of cervical spine, initial encounter: Secondary | ICD-10-CM | POA: Diagnosis not present

## 2020-10-11 DIAGNOSIS — Z853 Personal history of malignant neoplasm of breast: Secondary | ICD-10-CM | POA: Diagnosis not present

## 2020-10-11 DIAGNOSIS — J449 Chronic obstructive pulmonary disease, unspecified: Secondary | ICD-10-CM | POA: Diagnosis not present

## 2020-10-11 DIAGNOSIS — M542 Cervicalgia: Secondary | ICD-10-CM | POA: Diagnosis not present

## 2020-10-11 DIAGNOSIS — M47812 Spondylosis without myelopathy or radiculopathy, cervical region: Secondary | ICD-10-CM | POA: Diagnosis not present

## 2020-10-11 MED ORDER — HYDROCODONE-ACETAMINOPHEN 5-325 MG PO TABS
1.0000 | ORAL_TABLET | Freq: Once | ORAL | Status: AC
  Administered 2020-10-11: 1 via ORAL
  Filled 2020-10-11: qty 1

## 2020-10-11 MED ORDER — IBUPROFEN 800 MG PO TABS: 800.0000 mg | ORAL_TABLET | Freq: Three times a day (TID) | ORAL | 1 refills | Status: DC | PRN

## 2020-10-11 NOTE — Discharge Instructions (Signed)
You have been seen and discharged from the emergency department.  Your x-ray and CT imaging were normal.  Follow-up with your primary provider for reevaluation and further care. Take home medications as prescribed. If you have any worsening symptoms or further concerns for your health please return to an emergency department for further evaluation.
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
## 2020-10-12 NOTE — ED Notes (Signed)
Pt called regarding increased symptoms of headache, nausea and vision changes. She has placed a call with her PCP and has not heard back. She is aware to return to the ED or UC if she feels her symptoms need to be re evaluated. She verbalizes understanding. States she can not drive, she is going back to bed, waiting for her PCP to call.

## 2020-10-16 DIAGNOSIS — F4310 Post-traumatic stress disorder, unspecified: Secondary | ICD-10-CM | POA: Diagnosis not present

## 2020-10-16 DIAGNOSIS — F3181 Bipolar II disorder: Secondary | ICD-10-CM | POA: Diagnosis not present

## 2020-10-16 DIAGNOSIS — F422 Mixed obsessional thoughts and acts: Secondary | ICD-10-CM | POA: Diagnosis not present

## 2020-10-16 DIAGNOSIS — M47812 Spondylosis without myelopathy or radiculopathy, cervical region: Secondary | ICD-10-CM | POA: Diagnosis not present

## 2020-10-17 ENCOUNTER — Telehealth (HOSPITAL_COMMUNITY): Payer: Self-pay | Admitting: Physician Assistant

## 2020-10-17 NOTE — Telephone Encounter (Signed)
Patient called requesting to schedule an appointment. Writer scheduled patient at the soonest available (12/11/20), patient was not pleased with how far out the appointment was but accepted. Writer informed patient that this is the first available, but provider may have the appointment changed if necessary. Patient also requested to provider to get back in contact with her regarding situations that they've previously discussed. Another complaint was made regarding the fact that she is unable to personally message provider in [place]. Writer explained that she does not have control over that and MyChart customer service can be contacted for any complaints or resolutions.

## 2020-10-22 NOTE — Telephone Encounter (Signed)
Pt called requesting resources for "medium term hospitalization" for daily therapy. Pt states her therapist sees her once a month and pt requesting assistance to find something covered by her insurance that fits what she needs. Also requested housing assistance. Forwarded to [employer] person.

## 2020-10-22 NOTE — Telephone Encounter (Signed)
Pt called requesting resources for "medium term hospitalization" for daily therapy. Pt states her therapist sees her once a month and pt requesting assistance to find something covered by her insurance that fits what she needs. Also requested housing assistance. Forwarded to [employer] person.   We do not have Case mgmt to offer here & patient should reach out to her insurance company to see what medication benefits/coverage she has available

## 2020-10-25 NOTE — Telephone Encounter (Signed)
Patient was contacted yesterday via telephone to discuss her inquiries regarding medium term hospitalizations. Patient was informed that there were very limited resources that would qualify as medium term hospitalization. Patient expressed the need for a resource that could help treat her PTSD. She stated that medications such as mood stabilizers were never effective or they conflicted with the other medications was on. Patient expresses that obtaining housing would be the most beneficial and positive step forward in the management of her PTSD. Patient states that she is currently living with her mother, who is known to worsen her PTSD symptoms. Patient was informed that resources would be looked into.

## 2020-10-26 DIAGNOSIS — F3181 Bipolar II disorder: Secondary | ICD-10-CM | POA: Diagnosis not present

## 2020-10-26 DIAGNOSIS — F4310 Post-traumatic stress disorder, unspecified: Secondary | ICD-10-CM | POA: Diagnosis not present

## 2020-10-26 DIAGNOSIS — F422 Mixed obsessional thoughts and acts: Secondary | ICD-10-CM | POA: Diagnosis not present

## 2020-10-30 ENCOUNTER — Other Ambulatory Visit: Payer: Self-pay

## 2020-10-30 ENCOUNTER — Ambulatory Visit (HOSPITAL_COMMUNITY)
Admission: EM | Admit: 2020-10-30 | Discharge: 2020-10-30 | Disposition: A | Discharge status: Home / Self Care | Payer: Medicare Other | Attending: Psychiatry | Admitting: Psychiatry

## 2020-10-30 ENCOUNTER — Ambulatory Visit (HOSPITAL_COMMUNITY): Payer: Medicare Other | Admitting: Clinical

## 2020-10-30 DIAGNOSIS — F4310 Post-traumatic stress disorder, unspecified: Secondary | ICD-10-CM | POA: Insufficient documentation

## 2020-10-30 DIAGNOSIS — F429 Obsessive-compulsive disorder, unspecified: Secondary | ICD-10-CM | POA: Diagnosis not present

## 2020-10-30 NOTE — Discharge Summary (Signed)
Catherine Olsen to be D/C'd Home per NP order. Discussed with the patient and all questions fully answered. An After Visit Summary was printed and given to the patient. Patient escorted out and D/C home via private auto.  [name]  10/30/2020 9:04 AM
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
## 2020-10-30 NOTE — BH Assessment (Signed)
Pt initially went to [facility] outpatient for services but was told there was not a provider for her to see. Pt came to [facility] reporting that she really needs to talk with a therapist and is interested in a PHP. Pt reports worsening depression for the past year. Pt reports diagnosis of PTSD, Bipolar and medical OCD. Pt has outpatient services. Pt denies SI, HI, AVH.   Pt is routine.

## 2020-10-30 NOTE — Discharge Instructions (Addendum)
[facility] patient- [address] [place] [address]  Referral made 10/30/2020  [facility] outpatient  [phone]  [place]

## 2020-11-01 DIAGNOSIS — M47817 Spondylosis without myelopathy or radiculopathy, lumbosacral region: Secondary | ICD-10-CM | POA: Diagnosis not present

## 2020-11-01 DIAGNOSIS — M79672 Pain in left foot: Secondary | ICD-10-CM | POA: Diagnosis not present

## 2020-11-01 DIAGNOSIS — M47812 Spondylosis without myelopathy or radiculopathy, cervical region: Secondary | ICD-10-CM | POA: Diagnosis not present

## 2020-11-03 DIAGNOSIS — F3181 Bipolar II disorder: Secondary | ICD-10-CM | POA: Diagnosis not present

## 2020-11-03 DIAGNOSIS — F422 Mixed obsessional thoughts and acts: Secondary | ICD-10-CM | POA: Diagnosis not present

## 2020-11-03 DIAGNOSIS — F4310 Post-traumatic stress disorder, unspecified: Secondary | ICD-10-CM | POA: Diagnosis not present

## 2020-11-05 ENCOUNTER — Encounter: Payer: Self-pay | Admitting: Hematology and Oncology

## 2020-11-06 DIAGNOSIS — M542 Cervicalgia: Secondary | ICD-10-CM | POA: Diagnosis not present

## 2020-11-06 NOTE — Progress Notes (Signed)
  HEMATOLOGY-ONCOLOGY TELEPHONE VISIT PROGRESS NOTE  I connected with Catherine Olsen on 11/07/2020 at  8:30 AM EDT by telephone and verified that I am speaking with the correct person using two identifiers.  I discussed the limitations, risks, security and privacy concerns of performing an evaluation and management service by telephone and the availability of in person appointments.  I also discussed with the patient that there may be a patient responsible charge related to this service. The patient expressed understanding and agreed to proceed.   History of Present Illness: Catherine Olsen is a 50 y.o. female with above-mentioned history of right lumpectomy, and has declined radiation and antiestrogen therapy. She presents via telephone today for follow-up. Mild hot flashes  Oncology History  Ductal carcinoma in situ (DCIS) of right breast  06/29/2019 Initial Diagnosis   Right breast biopsy: Fibrocystic changes, pseudoangiomatous stromal hyperplasia   07/21/2019 Surgery   Right lumpectomy: Dr. Lucia Gaskins: Intermediate grade DCIS 0.2 cm, 0.1 cm from anterior margin, LCIS, ER 95%, PR 30% Tis NX stage 0   07/21/2019 Cancer Staging   Staging form: Breast, AJCC 8th Edition - Pathologic stage from 07/21/2019: Stage 0 (pTis (DCIS), pN0, cM0, ER+, PR+) - Signed by Gardenia Phlegm, NP on 08/03/2019    07/05/2020 -  Anti-estrogen oral therapy   10 mg of tamoxifen daily        Assessment Plan:  Ductal carcinoma in situ (DCIS) of right breast Right lumpectomy: Dr. Lucia Gaskins: Intermediate grade DCIS 0.2 cm, 0.1 cm from anterior margin, LCIS, ER 95%, PR 30% Tis NX stage 0  07/06/20: Right Lumpectomy: 0.2 cm IG DCIS Margins Neg, ER /PR Positive Treatment plan: Adjuvant radiation therapy: Patient discussed with Dr. Sondra Come and decided against doing radiation.  ___________________________________________________________________________ Maxillofacial CT scan 11/22/2019: Normal  Dr. Dorethea Clan is her psychiatrist    Breast Cancer Surveillance: Mammogram 10/26/2019: Benign breast density category D Breast MRI 06/29/2020: Interval 5.6 x 3.6 x 2.9 cm area of low-grade ductal enhancement in the upper half of right breast.   Patient decided not to undergo surgery for this and is currently on tamoxifen.   Tamoxifen Toxicities:None  We will plan for breast MRI in September and follow-up after that to discuss if tamoxifen is working for her. Patient does not want to do surgery no matter what.  If there is evidence of progression we will consider switching her to aromatase centimeter therapy.  I discussed the assessment and treatment plan with the patient. The patient was provided an opportunity to ask questions and all were answered. The patient agreed with the plan and demonstrated an understanding of the instructions. The patient was advised to call back or seek an in-person evaluation if the symptoms worsen or if the condition fails to improve as anticipated.   Total time spent: 12 mins including non-face to face time and time spent for planning, charting and coordination of care  Rulon Eisenmenger, MD 11/07/2020    I, Thana Ates, am acting as scribe for Nicholas Lose, MD.  I have reviewed the above documentation for accuracy and completeness, and I agree with the above.

## 2020-11-06 NOTE — Assessment & Plan Note (Signed)
Right lumpectomy: Dr. Lucia Gaskins: Intermediate grade DCIS 0.2 cm, 0.1 cm from anterior margin, LCIS, ER 95%, PR 30% Tis NX stage 0  Treatment plan: 1. Adjuvant radiation therapy: Patient discussed with Dr. Sondra Come anddecided against doing radiation. 2.Opted against anti estrogen therapy.  ___________________________________________________________________________ Maxillofacial CT scan 11/22/2019: Normal Dr. Dorethea Clan is her psychiatrist  Breast Cancer Surveillance: Mammogram 10/26/2019: Benign breast density category D Breast MRI 06/29/2020: Interval 5.6 x 3.6 x 2.9 cm area of low-grade ductal enhancement in the upper half of right b1. reast.   07/06/20: Right Lumpectomy: 0.2 cm IG DCIS Margins Neg, ER /PR Positive  Tamoxifen Toxicities:None  Enlarged right axillary lymph node with possible mastitis: I sent a prescription for antibiotics.  I strongly encouraged her to get a biopsy but she is not interested in it.  We would like to obtain an ultrasound of the axilla for further evaluation.  RTC in 3 months

## 2020-11-07 ENCOUNTER — Inpatient Hospital Stay: Payer: Medicare Other | Attending: Hematology and Oncology | Admitting: Hematology and Oncology

## 2020-11-07 ENCOUNTER — Encounter: Payer: Self-pay | Admitting: Hematology and Oncology

## 2020-11-07 DIAGNOSIS — D0511 Intraductal carcinoma in situ of right breast: Secondary | ICD-10-CM | POA: Diagnosis not present

## 2020-11-07 MED ORDER — TAMOXIFEN CITRATE 20 MG PO TABS: 20.0000 mg | ORAL_TABLET | Freq: Every day | ORAL | 3 refills | Status: DC

## 2020-11-08 ENCOUNTER — Telehealth (HOSPITAL_COMMUNITY): Payer: Self-pay | Admitting: Physician Assistant

## 2020-11-08 ENCOUNTER — Encounter: Payer: Self-pay | Admitting: Hematology and Oncology

## 2020-11-08 NOTE — Telephone Encounter (Signed)
Patient requesting provider return call to her [phone]. Pt says worsening symptoms and PHP intake is 11/14/20 and she sees her therapist once a week but states she needs to talk to her psychiatrist about what to do in the meantime.

## 2020-11-09 NOTE — Telephone Encounter (Signed)
Message acknowledged and reviewed. Provider to respond to patient's message next week.

## 2020-11-10 DIAGNOSIS — F3181 Bipolar II disorder: Secondary | ICD-10-CM | POA: Diagnosis not present

## 2020-11-10 DIAGNOSIS — F4310 Post-traumatic stress disorder, unspecified: Secondary | ICD-10-CM | POA: Diagnosis not present

## 2020-11-10 DIAGNOSIS — F422 Mixed obsessional thoughts and acts: Secondary | ICD-10-CM | POA: Diagnosis not present

## 2020-11-14 ENCOUNTER — Other Ambulatory Visit (HOSPITAL_COMMUNITY): Payer: Medicare Other | Attending: Psychiatry

## 2020-11-14 ENCOUNTER — Other Ambulatory Visit: Payer: Self-pay

## 2020-11-14 DIAGNOSIS — R4589 Other symptoms and signs involving emotional state: Secondary | ICD-10-CM | POA: Insufficient documentation

## 2020-11-14 DIAGNOSIS — M542 Cervicalgia: Secondary | ICD-10-CM | POA: Diagnosis not present

## 2020-11-14 DIAGNOSIS — F3162 Bipolar disorder, current episode mixed, moderate: Secondary | ICD-10-CM | POA: Insufficient documentation

## 2020-11-14 DIAGNOSIS — Z79899 Other long term (current) drug therapy: Secondary | ICD-10-CM | POA: Insufficient documentation

## 2020-11-14 DIAGNOSIS — R45851 Suicidal ideations: Secondary | ICD-10-CM | POA: Insufficient documentation

## 2020-11-14 DIAGNOSIS — F17210 Nicotine dependence, cigarettes, uncomplicated: Secondary | ICD-10-CM | POA: Insufficient documentation

## 2020-11-14 DIAGNOSIS — R413 Other amnesia: Secondary | ICD-10-CM | POA: Insufficient documentation

## 2020-11-14 DIAGNOSIS — F3160 Bipolar disorder, current episode mixed, unspecified: Secondary | ICD-10-CM | POA: Insufficient documentation

## 2020-11-14 DIAGNOSIS — R41844 Frontal lobe and executive function deficit: Secondary | ICD-10-CM | POA: Insufficient documentation

## 2020-11-14 DIAGNOSIS — F4310 Post-traumatic stress disorder, unspecified: Secondary | ICD-10-CM | POA: Insufficient documentation

## 2020-11-15 ENCOUNTER — Other Ambulatory Visit (HOSPITAL_COMMUNITY): Payer: Medicare Other | Admitting: Occupational Therapy

## 2020-11-15 ENCOUNTER — Encounter (HOSPITAL_COMMUNITY): Payer: Self-pay

## 2020-11-15 ENCOUNTER — Other Ambulatory Visit (HOSPITAL_COMMUNITY): Payer: Medicare Other | Admitting: Licensed Clinical Social Worker

## 2020-11-15 DIAGNOSIS — Z79899 Other long term (current) drug therapy: Secondary | ICD-10-CM | POA: Diagnosis not present

## 2020-11-15 DIAGNOSIS — R45851 Suicidal ideations: Secondary | ICD-10-CM | POA: Diagnosis not present

## 2020-11-15 DIAGNOSIS — F3160 Bipolar disorder, current episode mixed, unspecified: Secondary | ICD-10-CM

## 2020-11-15 DIAGNOSIS — F4310 Post-traumatic stress disorder, unspecified: Secondary | ICD-10-CM

## 2020-11-15 DIAGNOSIS — F17210 Nicotine dependence, cigarettes, uncomplicated: Secondary | ICD-10-CM | POA: Diagnosis not present

## 2020-11-15 DIAGNOSIS — R413 Other amnesia: Secondary | ICD-10-CM | POA: Diagnosis not present

## 2020-11-15 DIAGNOSIS — R41844 Frontal lobe and executive function deficit: Secondary | ICD-10-CM | POA: Diagnosis not present

## 2020-11-15 DIAGNOSIS — R4589 Other symptoms and signs involving emotional state: Secondary | ICD-10-CM | POA: Diagnosis not present

## 2020-11-15 DIAGNOSIS — F3162 Bipolar disorder, current episode mixed, moderate: Secondary | ICD-10-CM | POA: Diagnosis not present
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
## 2020-11-16 ENCOUNTER — Encounter (HOSPITAL_COMMUNITY): Payer: Self-pay

## 2020-11-16 ENCOUNTER — Encounter (HOSPITAL_COMMUNITY): Payer: Self-pay | Admitting: Family

## 2020-11-16 ENCOUNTER — Other Ambulatory Visit (HOSPITAL_COMMUNITY): Payer: Self-pay | Admitting: Physician Assistant

## 2020-11-16 ENCOUNTER — Other Ambulatory Visit (HOSPITAL_COMMUNITY): Payer: Medicare Other | Admitting: Occupational Therapy

## 2020-11-16 ENCOUNTER — Other Ambulatory Visit (HOSPITAL_COMMUNITY): Payer: Medicare Other | Admitting: Licensed Clinical Social Worker

## 2020-11-16 ENCOUNTER — Other Ambulatory Visit: Payer: Self-pay

## 2020-11-16 ENCOUNTER — Telehealth (HOSPITAL_COMMUNITY): Payer: Self-pay | Admitting: Physician Assistant

## 2020-11-16 DIAGNOSIS — F3160 Bipolar disorder, current episode mixed, unspecified: Secondary | ICD-10-CM

## 2020-11-16 DIAGNOSIS — R4589 Other symptoms and signs involving emotional state: Secondary | ICD-10-CM

## 2020-11-16 DIAGNOSIS — R41844 Frontal lobe and executive function deficit: Secondary | ICD-10-CM

## 2020-11-16 DIAGNOSIS — G4700 Insomnia, unspecified: Secondary | ICD-10-CM

## 2020-11-16 DIAGNOSIS — F3162 Bipolar disorder, current episode mixed, moderate: Secondary | ICD-10-CM | POA: Diagnosis not present

## 2020-11-16 DIAGNOSIS — F4310 Post-traumatic stress disorder, unspecified: Secondary | ICD-10-CM

## 2020-11-16 MED ORDER — TRAZODONE HCL 100 MG PO TABS: 100.0000 mg | ORAL_TABLET | Freq: Every day | ORAL | 1 refills | Status: DC
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
## 2020-11-16 NOTE — Addendum Note (Signed)
Addended by: Derrill Center on: 11/16/2020 11:21 AM   Modules accepted: Miquel Dunn

## 2020-11-16 NOTE — Therapy (Signed)
[place], [place], [address] Phone: [phone]   Fax:  [phone] Virtual Visit via Video Note  I connected with Catherine Olsen on 11/16/20 at  12:00 PM EDT by a video enabled telemedicine application and verified that I am speaking with the correct person using two identifiers.  Location: Patient: Patient Home Provider: Clinic Office   I discussed the limitations of evaluation and management by telemedicine and the availability of in person appointments. The patient expressed understanding and agreed to proceed.   I discussed the assessment and treatment plan with the patient. The patient was provided an opportunity to ask questions and all were answered. The patient agreed with the plan and demonstrated an understanding of the instructions.   The patient was advised to call back or seek an in-person evaluation if the symptoms worsen or if the condition fails to improve as anticipated.  I provided 50 minutes of non-face-to-face time during this encounter.   Ponciano Ort, OT   Occupational Therapy Treatment  Patient Details  Name: Catherine Olsen MRN: [id number] Date of Birth: Jun 15, 1970 Referring Provider (OT): Ricky Ala   Encounter Date: 11/16/2020   OT End of Session - 11/16/20 1431     Visit Number 2    Number of Visits 20    Date for OT Re-Evaluation 12/13/20    Authorization Type Medicaid    OT Start Time 1200    OT Stop Time 1250    OT Time Calculation (min) 50 min    Activity Tolerance Patient tolerated treatment well    Behavior During Therapy WFL for tasks assessed/performed             Past Medical History:  Diagnosis Date   Abnormal uterine bleeding (AUB) 06/25/2009   Qualifier: Diagnosis of  By: Cathren Laine MD, Ankit     Allergy    Angio-edema    Anxiety    Arthritis    hands, lower back, knees   Asthma    Bipolar 1 disorder ([place])    Breast cancer ([place])    stage 0    Chronic prescription benzodiazepine use 09/08/2018   COPD (chronic obstructive pulmonary disease) ([place])    Depression    Endometriosis    Fibromyalgia    GERD (gastroesophageal reflux disease)    Headache(784.0)    otc med prn   Migraine    Pituitary tumor    microadenoma   PONV (postoperative nausea and vomiting)    PTSD (post-traumatic stress disorder)    Scoliosis    Termination of pregnancy    x 2 at age 41 and 50 yrs old   Tobacco abuse 03/04/2015   Urticaria     Past Surgical History:  Procedure Laterality Date   BREAST BIOPSY Right 05/19/2007   BREAST BIOPSY Right 06/02/2007   BREAST BIOPSY  06/29/2019   BREAST EXCISIONAL BIOPSY     BREAST LUMPECTOMY Right 07/21/2019   BREAST LUMPECTOMY WITH RADIOACTIVE SEED LOCALIZATION Right 07/21/2019   Procedure: RIGHT BREAST LUMPECTOMY WITH RADIOACTIVE SEED LOCALIZATION;  Surgeon: Alphonsa Overall, MD;  Location: [facility];  Service: General;  Laterality: Right;   BREAST SURGERY     DILATION AND CURETTAGE OF UTERUS     FACIAL COSMETIC SURGERY     right cheek   LAPAROSCOPY Right 11/11/2013   Procedure: LAPAROSCOPY OPERATIVE with  Drainage of  RIGHT Ovarian ENDOMETRIOMA;  Surgeon: Elveria Royals, MD;  Location: [facility];  Service: Gynecology;  Laterality: Right;   NASAL ENDOSCOPY     said it showed some acid reflux from ENT   NOSE SURGERY     rhinoplasty at age 40 yrs   WISDOM TOOTH EXTRACTION      There were no vitals filed for this visit.   Subjective Assessment - 11/16/20 1431     Currently in Pain? No/denies               OT Education - 11/16/20 1431     Education Details Educated on different communication styles with strategies to become more assertive with use of XYZ communication tool    Person(s) Educated Patient    Methods Explanation;Handout    Comprehension Verbalized understanding              OT Short Term Goals - 11/16/20 1432       OT SHORT TERM GOAL #1   Title Pt will actively  engage in OT group sessions throughout duration of PHP programming, in order to promote daily structure, social engagement, and opportunities to develop and utilize adaptive strategies to maximize functional performance in preparation for safe transition and integration back into school, work, and the community.    Status On-going      OT SHORT TERM GOAL #2   Title Pt will demonstrate improved ability to communicate feelings/needs/wants, without being angry/irritable/aggressive, as evidenced by, active participation in OT sessions, throughout duration of PHP programming, in order to safely transition back into the community at discharge.    Status On-going      OT SHORT TERM GOAL #3   Title Pt will practice and identify 1-3 adaptive coping strategies she can utilize, in order to safely manage increased depression/anxiety, with min cues, in preparation for safe and healthy reintegration back into the community at discharge.    Status On-going      OT SHORT TERM GOAL #4   Title Pt will identify 1-3 strategies to increase social participation, in order to promote healthy socialization and community reintegration, in preparation for discharge.    Status On-going           Group Session:  S: "I don't know how I can use these tips when I can't change or control the behavior of other people."  O: Group began with a reflection from previous OT session focused on communication styles and group members re-iterated what was learned during previous session. Members shared and reflected on any opportunities they were presented with last evening to practice their assertiveness skills or recognize patterns of communication observed. Today's group focused on assertiveness skills training and use of the XYZ* assertive communication tool was introduced. The XYZ communication tool states: I feel X when you do Y in situation Z and I would like _________. X is the emotion, Y is the specific behavior, and Z is the  specific situation. Group members each formulated their own XYZ statement and shared with the group to discuss and offer feedback. Additional tips and strategies to practice being assertive were also introduced and discussed.   A: Catherine Olsen was active and independent in her participation of discussion and activity, sharing that she thinks she is an Nurse, mental health, however it is made difficult based upon the person who she lives with (Mom). She shared that she cannot reason with her and most conversations end in an argument. Pt appeared somewhat open and receptive to use of XYZ formula and expressed interest in utilizing assertive tips reviewed in the  future.   P: Continue to attend PHP OT group sessions 5x week for 3 weeks to promote daily structure, social engagement, and opportunities to develop and utilize adaptive strategies to maximize functional performance in preparation for safe transition and integration back into school, work, and the community. Plan to address topic of fair fighting in next OT group session.  Plan - 11/16/20 1431     Occupational performance deficits (Please refer to evaluation for details): ADL's;IADL's;Rest and Sleep;Work;Social Participation;Leisure;Education    Body Structure / Function / Physical Skills ADL    Cognitive Skills Attention;Emotional;Energy/Drive;Learn;Memory;Perception;Understand;Thought;Temperament/Personality;Safety Awareness;Problem Solve    Psychosocial Skills Coping Strategies;Environmental  Adaptations;Habits;Interpersonal Interaction;Routines and Behaviors             Patient will benefit from skilled therapeutic intervention in order to improve the following deficits and impairments:   Body Structure / Function / Physical Skills: ADL Cognitive Skills: Attention, Emotional, Energy/Drive, Learn, Memory, Perception, Understand, Thought, Temperament/Personality, Safety Awareness, Problem Solve Psychosocial Skills: Coping Strategies,  Environmental  Adaptations, Habits, Interpersonal Interaction, Routines and Behaviors   Visit Diagnosis: Difficulty coping  Frontal lobe and executive function deficit  Mixed bipolar I disorder [facility])    Problem List Patient Active Problem List   Diagnosis Date Noted   Bipolar 1 disorder, depressed, severe ([place]) 08/12/2020   Breast cancer ([place])    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   History of suicidal ideation 09/08/2018   Other allergic rhinitis 02/02/2018   Allergy, unspecified, subsequent encounter 02/02/2018   Mild intermittent asthma without complication 0000000   Angio-edema 02/02/2018   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   Gastroesophageal reflux disease without esophagitis 02/02/2018   Chronic dental pain 01/20/2018   Chronic facial pain 01/20/2018   Prolonged Q-T interval on ECG 01/02/2018   Environmental allergies 10/29/2017   Pain in joint of left shoulder 07/15/2017   Neck pain 07/15/2017   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   Chronic migraine without aura without status migrainosus, not intractable 03/24/2017   Chronic midline low back pain without sciatica 03/24/2017   Pulmonary emphysema ([place]) 02/19/2017   Jaw pain 02/11/2017   Smoking history 12/09/2016   Exertional dyspnea 12/09/2016   Family history of alpha 1 antitrypsin deficiency 09/04/2016   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   Nodule of left lung 09/04/2016   Cervical lymphadenopathy 01/29/2016   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[place])    Perimenopausal vasomotor symptoms 05/25/2015   Chronic rhinitis 04/18/2015   Pituitary microadenoma ([place]) 05/19/2014   Endometriosis of ovary 04/03/2014   Ovarian cyst, right 04/03/2014   Posttraumatic stress disorder 09/13/2012   Fibrocystic breast disease 08/24/2012   Myalgia and myositis 07/26/2012   Depression 07/26/2012   PTSD (post-traumatic stress disorder) 01/09/2009   Insomnia 01/17/2008   NEVI, MULTIPLE 07/17/2006    KERATOSIS, SEBORRHEIC NEC 07/17/2006   ACNE NEC 07/17/2006    11/16/2020  Ponciano Ort, MOT, OTR/L 11/16/2020, 2:33 PM  Catherine Olsen PARTIAL HOSPITALIZATION PROGRAM [place], [place], [address] Phone: [phone]   Fax:  [phone]  Name: Catherine Olsen MRN: [id number] Date of Birth: 05-24-1970

## 2020-11-16 NOTE — Telephone Encounter (Signed)
Provider was contacted by [employer]as regarding patient's message. Provider contacted patient and discussed her concerns. Patient's medication was properly bridged during the encounter so that she would not run out of her medication before her next appointment time.

## 2020-11-16 NOTE — Telephone Encounter (Signed)
Patient called stating that she has tried to contact this office for a week with no success. Expressed deep concern for her provider's unavailability. Also concerned about whomever she speaks with never relaying messages to her provider so that he can get in contact with her. Patient states she needs to speak with her provider ASAP, since it's been a week. Writer listened to patients concern and informed that this message will be given to provider.

## 2020-11-16 NOTE — Progress Notes (Signed)
Provider was contacted by [employer] Leas regarding patient's message. Provider contacted patient and discussed her concerns. Patient informed provider that she is currently enrolled in the partial hospitalization program.  Patient expresses concerns that she will run out of her trazodone medication before her next appointment with provider. Patient's next appointment is scheduled for 12/11/2020. Patient's medication was properly bridged during the encounter so that she would not run out of her medication before her next appointment time.

## 2020-11-17 DIAGNOSIS — F4310 Post-traumatic stress disorder, unspecified: Secondary | ICD-10-CM | POA: Diagnosis not present

## 2020-11-17 DIAGNOSIS — F3181 Bipolar II disorder: Secondary | ICD-10-CM | POA: Diagnosis not present

## 2020-11-17 DIAGNOSIS — F422 Mixed obsessional thoughts and acts: Secondary | ICD-10-CM | POA: Diagnosis not present

## 2020-11-19 ENCOUNTER — Other Ambulatory Visit (HOSPITAL_COMMUNITY): Payer: Medicare Other | Admitting: Licensed Clinical Social Worker

## 2020-11-19 ENCOUNTER — Other Ambulatory Visit: Payer: Self-pay

## 2020-11-19 ENCOUNTER — Other Ambulatory Visit (HOSPITAL_COMMUNITY): Payer: Medicare Other | Admitting: Occupational Therapy

## 2020-11-19 ENCOUNTER — Encounter (HOSPITAL_COMMUNITY): Payer: Self-pay

## 2020-11-19 DIAGNOSIS — R41844 Frontal lobe and executive function deficit: Secondary | ICD-10-CM

## 2020-11-19 DIAGNOSIS — F3160 Bipolar disorder, current episode mixed, unspecified: Secondary | ICD-10-CM | POA: Diagnosis not present

## 2020-11-19 DIAGNOSIS — R4589 Other symptoms and signs involving emotional state: Secondary | ICD-10-CM

## 2020-11-19 DIAGNOSIS — F3162 Bipolar disorder, current episode mixed, moderate: Secondary | ICD-10-CM | POA: Diagnosis not present

## 2020-11-19 DIAGNOSIS — F4310 Post-traumatic stress disorder, unspecified: Secondary | ICD-10-CM

## 2020-11-19 NOTE — Therapy (Addendum)
[place], [place], [address] Phone: [phone]   Fax:  [phone] Virtual Visit via Video Note  I connected with [name] on 11/19/20 at  12:00 PM EDT by a video enabled telemedicine application and verified that I am speaking with the correct person using two identifiers.  Location: Patient: Patient Home Provider: Clinic Office    I discussed the limitations of evaluation and management by telemedicine and the availability of in person appointments. The patient expressed understanding and agreed to proceed.   I discussed the assessment and treatment plan with the patient. The patient was provided an opportunity to ask questions and all were answered. The patient agreed with the plan and demonstrated an understanding of the instructions.   The patient was advised to call back or seek an in-person evaluation if the symptoms worsen or if the condition fails to improve as anticipated.  I provided 30 minutes of non-face-to-face time during this encounter.   Ponciano Ort, OT   Occupational Therapy Treatment  Patient Details  Name: Catherine Olsen MRN: [id number] Date of Birth: 1970-12-13 Referring Provider (OT): Ricky Ala   Encounter Date: 11/19/2020   OT End of Session - 11/19/20 1307     Visit Number 3    Number of Visits 20    Date for OT Re-Evaluation 12/13/20    Authorization Type Medicaid    OT Start Time 1200    OT Stop Time 1230    OT Time Calculation (min) 30 min    Activity Tolerance Patient tolerated treatment well    Behavior During Therapy WFL for tasks assessed/performed             Past Medical History:  Diagnosis Date   Abnormal uterine bleeding (AUB) 06/25/2009   Qualifier: Diagnosis of  By: Cathren Laine MD, Ankit     Allergy    Angio-edema    Anxiety    Arthritis    hands, lower back, knees   Asthma    Bipolar 1 disorder ([place])    Breast cancer ([place])    stage 0    Chronic prescription benzodiazepine use 09/08/2018   COPD (chronic obstructive pulmonary disease) ([place])    Depression    Endometriosis    Fibromyalgia    GERD (gastroesophageal reflux disease)    Headache(784.0)    otc med prn   Migraine    Pituitary tumor    microadenoma   PONV (postoperative nausea and vomiting)    PTSD (post-traumatic stress disorder)    Scoliosis    Termination of pregnancy    x 2 at age 89 and 50 yrs old   Tobacco abuse 03/04/2015   Urticaria     Past Surgical History:  Procedure Laterality Date   BREAST BIOPSY Right 05/19/2007   BREAST BIOPSY Right 06/02/2007   BREAST BIOPSY  06/29/2019   BREAST EXCISIONAL BIOPSY     BREAST LUMPECTOMY Right 07/21/2019   BREAST LUMPECTOMY WITH RADIOACTIVE SEED LOCALIZATION Right 07/21/2019   Procedure: RIGHT BREAST LUMPECTOMY WITH RADIOACTIVE SEED LOCALIZATION;  Surgeon: Alphonsa Overall, MD;  Location: [facility];  Service: General;  Laterality: Right;   BREAST SURGERY     DILATION AND CURETTAGE OF UTERUS     FACIAL COSMETIC SURGERY     right cheek   LAPAROSCOPY Right 11/11/2013   Procedure: LAPAROSCOPY OPERATIVE with  Drainage of  RIGHT Ovarian ENDOMETRIOMA;  Surgeon: Elveria Royals, MD;  Location: [facility];  Service: Gynecology;  Laterality: Right;   NASAL ENDOSCOPY     said it showed some acid reflux from ENT   NOSE SURGERY     rhinoplasty at age 75 yrs   WISDOM TOOTH EXTRACTION      There were no vitals filed for this visit.   Subjective Assessment - 11/19/20 1307     Currently in Pain? No/denies                                  OT Education - 11/19/20 1307     Education Details Educated on how to navigate aggressive communication and reviewed strategies/tips to "fight fair"    Person(s) Educated Patient    Methods Explanation;Handout    Comprehension Verbalized understanding              OT Short Term Goals - 11/16/20 1432       OT SHORT TERM GOAL #1   Title  Pt will actively engage in OT group sessions throughout duration of PHP programming, in order to promote daily structure, social engagement, and opportunities to develop and utilize adaptive strategies to maximize functional performance in preparation for safe transition and integration back into school, work, and the community.    Status On-going      OT SHORT TERM GOAL #2   Title Pt will demonstrate improved ability to communicate feelings/needs/wants, without being angry/irritable/aggressive, as evidenced by, active participation in OT sessions, throughout duration of PHP programming, in order to safely transition back into the community at discharge.    Status On-going      OT SHORT TERM GOAL #3   Title Pt will practice and identify 1-3 adaptive coping strategies she can utilize, in order to safely manage increased depression/anxiety, with min cues, in preparation for safe and healthy reintegration back into the community at discharge.    Status On-going      OT SHORT TERM GOAL #4   Title Pt will identify 1-3 strategies to increase social participation, in order to promote healthy socialization and community reintegration, in preparation for discharge.    Status On-going           Group Session:  S: "I think I can be pretty assertive when I need/want to be."  O: Group began with a reflection from previous OT session on assertiveness skills and training. Group members shared an example of how they practiced being assertive post group and into their evening. Today's group was an expansion on assertive training and communication with a focus on [place]. Group members were provided with a set of 'rules' and tips on how to fight fairly, in order to come off as more assertive and less aggressive. Members identified one rule or tip they felt they struggled most with and were encouraged to focus on improving their communication within identified rule.    A: Starnisha was present for the first  thirty minutes of group, however had to leave early for a medical/dentist appointment. Pt was active in her participation when present and actively engaged in scenarios and identifying assertive responses. She shared that she can be assertive when she needs to be, however shared that she struggles to navigate conversations with her mom, as they often turn into aggression and arguments. Pt left group early, however appeared receptive to reviewing handout on fair fighting.   P: Continue to attend PHP OT group sessions 5x week for 2 weeks  to promote daily structure, social engagement, and opportunities to develop and utilize adaptive strategies to maximize functional performance in preparation for safe transition and integration back into school, work, and the community. Plan to address topic of self-care in next OT group session.  OCCUPATIONAL THERAPY DISCHARGE SUMMARY  Visits from Start of Care: 3  Current functional level related to goals / functional outcomes: Patient has not met any of her OT goal - pt discharged at pt request with recommended follow up with OP therapist and provider.   Remaining deficits: See above   Education / Equipment: See above    Patient agrees to discharge. Patient goals were not met. Patient is being discharged due to the patient's request..      Plan - 11/19/20 1308     Occupational performance deficits (Please refer to evaluation for details): ADL's;IADL's;Rest and Sleep;Work;Social Participation;Leisure;Education    Body Structure / Function / Physical Skills ADL    Cognitive Skills Attention;Emotional;Energy/Drive;Learn;Memory;Perception;Understand;Thought;Temperament/Personality;Safety Awareness;Problem Solve    Psychosocial Skills Coping Strategies;Environmental  Adaptations;Habits;Interpersonal Interaction;Routines and Behaviors             Patient will benefit from skilled therapeutic intervention in order to improve the following deficits and  impairments:   Body Structure / Function / Physical Skills: ADL Cognitive Skills: Attention, Emotional, Energy/Drive, Learn, Memory, Perception, Understand, Thought, Temperament/Personality, Safety Awareness, Problem Solve Psychosocial Skills: Coping Strategies, Environmental  Adaptations, Habits, Interpersonal Interaction, Routines and Behaviors   Visit Diagnosis: Difficulty coping  Frontal lobe and executive function deficit  Mixed bipolar I disorder [facility])    Problem List Patient Active Problem List   Diagnosis Date Noted   Bipolar 1 disorder, depressed, severe ([place]) 08/12/2020   Breast cancer ([place])    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   History of suicidal ideation 09/08/2018   Other allergic rhinitis 02/02/2018   Allergy, unspecified, subsequent encounter 02/02/2018   Mild intermittent asthma without complication 94/17/4081   Angio-edema 02/02/2018   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   Gastroesophageal reflux disease without esophagitis 02/02/2018   Chronic dental pain 01/20/2018   Chronic facial pain 01/20/2018   Prolonged Q-T interval on ECG 01/02/2018   Environmental allergies 10/29/2017   Pain in joint of left shoulder 07/15/2017   Neck pain 07/15/2017   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   Chronic migraine without aura without status migrainosus, not intractable 03/24/2017   Chronic midline low back pain without sciatica 03/24/2017   Pulmonary emphysema ([place]) 02/19/2017   Jaw pain 02/11/2017   Smoking history 12/09/2016   Exertional dyspnea 12/09/2016   Family history of alpha 1 antitrypsin deficiency 09/04/2016   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   Nodule of left lung 09/04/2016   Cervical lymphadenopathy 01/29/2016   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[place])    Perimenopausal vasomotor symptoms 05/25/2015   Chronic rhinitis 04/18/2015   Pituitary microadenoma ([place]) 05/19/2014   Endometriosis of ovary 04/03/2014   Ovarian cyst, right  04/03/2014   Posttraumatic stress disorder 09/13/2012   Fibrocystic breast disease 08/24/2012   Myalgia and myositis 07/26/2012   Depression 07/26/2012   PTSD (post-traumatic stress disorder) 01/09/2009   Insomnia 01/17/2008   NEVI, MULTIPLE 07/17/2006   KERATOSIS, SEBORRHEIC NEC 07/17/2006   ACNE NEC 07/17/2006    11/19/2020  Ponciano Ort, MOT, OTR/L  11/19/2020, 1:08 PM  [facility] PARTIAL HOSPITALIZATION PROGRAM [place], [place], [address] Phone: [phone]   Fax:  [phone]  Name: CARLIN MAMONE MRN: [id number] Date of Birth:  1970-05-22

## 2020-11-20 ENCOUNTER — Other Ambulatory Visit (HOSPITAL_COMMUNITY): Payer: Medicare Other

## 2020-11-20 ENCOUNTER — Other Ambulatory Visit: Payer: Self-pay

## 2020-11-20 ENCOUNTER — Ambulatory Visit: Payer: Medicare Other | Admitting: Allergy & Immunology

## 2020-11-20 DIAGNOSIS — M542 Cervicalgia: Secondary | ICD-10-CM | POA: Diagnosis not present

## 2020-11-21 ENCOUNTER — Other Ambulatory Visit: Payer: Self-pay

## 2020-11-21 ENCOUNTER — Other Ambulatory Visit (HOSPITAL_COMMUNITY): Payer: Medicare Other

## 2020-11-21 ENCOUNTER — Other Ambulatory Visit (HOSPITAL_COMMUNITY): Payer: Medicare Other | Admitting: Licensed Clinical Social Worker

## 2020-11-21 DIAGNOSIS — F3162 Bipolar disorder, current episode mixed, moderate: Secondary | ICD-10-CM | POA: Diagnosis not present

## 2020-11-21 DIAGNOSIS — F3160 Bipolar disorder, current episode mixed, unspecified: Secondary | ICD-10-CM

## 2020-11-21 DIAGNOSIS — F4310 Post-traumatic stress disorder, unspecified: Secondary | ICD-10-CM

## 2020-11-22 ENCOUNTER — Other Ambulatory Visit (HOSPITAL_COMMUNITY): Payer: Medicare Other | Admitting: Licensed Clinical Social Worker

## 2020-11-22 ENCOUNTER — Other Ambulatory Visit (HOSPITAL_COMMUNITY): Payer: Medicare Other

## 2020-11-22 ENCOUNTER — Other Ambulatory Visit: Payer: Self-pay

## 2020-11-22 DIAGNOSIS — F4310 Post-traumatic stress disorder, unspecified: Secondary | ICD-10-CM

## 2020-11-22 DIAGNOSIS — F3162 Bipolar disorder, current episode mixed, moderate: Secondary | ICD-10-CM | POA: Diagnosis not present

## 2020-11-22 DIAGNOSIS — F3160 Bipolar disorder, current episode mixed, unspecified: Secondary | ICD-10-CM

## 2020-11-23 ENCOUNTER — Other Ambulatory Visit (HOSPITAL_COMMUNITY): Payer: Medicare Other

## 2020-11-23 ENCOUNTER — Other Ambulatory Visit (HOSPITAL_COMMUNITY): Payer: Medicare Other | Admitting: Licensed Clinical Social Worker

## 2020-11-23 ENCOUNTER — Other Ambulatory Visit: Payer: Self-pay

## 2020-11-23 DIAGNOSIS — F4310 Post-traumatic stress disorder, unspecified: Secondary | ICD-10-CM

## 2020-11-23 DIAGNOSIS — F3162 Bipolar disorder, current episode mixed, moderate: Secondary | ICD-10-CM | POA: Diagnosis not present

## 2020-11-23 DIAGNOSIS — F3160 Bipolar disorder, current episode mixed, unspecified: Secondary | ICD-10-CM

## 2020-11-24 DIAGNOSIS — F422 Mixed obsessional thoughts and acts: Secondary | ICD-10-CM | POA: Diagnosis not present

## 2020-11-24 DIAGNOSIS — F3181 Bipolar II disorder: Secondary | ICD-10-CM | POA: Diagnosis not present

## 2020-11-24 DIAGNOSIS — F4310 Post-traumatic stress disorder, unspecified: Secondary | ICD-10-CM | POA: Diagnosis not present

## 2020-11-25 ENCOUNTER — Emergency Department (HOSPITAL_COMMUNITY)
Admission: EM | Admit: 2020-11-25 | Discharge: 2020-11-25 | Disposition: A | Discharge status: Home / Self Care | Payer: Medicare Other | Attending: Emergency Medicine | Admitting: Emergency Medicine

## 2020-11-25 ENCOUNTER — Other Ambulatory Visit: Payer: Self-pay

## 2020-11-25 DIAGNOSIS — Z853 Personal history of malignant neoplasm of breast: Secondary | ICD-10-CM | POA: Diagnosis not present

## 2020-11-25 DIAGNOSIS — J45909 Unspecified asthma, uncomplicated: Secondary | ICD-10-CM | POA: Insufficient documentation

## 2020-11-25 DIAGNOSIS — F314 Bipolar disorder, current episode depressed, severe, without psychotic features: Secondary | ICD-10-CM | POA: Insufficient documentation

## 2020-11-25 DIAGNOSIS — F32A Depression, unspecified: Secondary | ICD-10-CM | POA: Diagnosis present

## 2020-11-25 DIAGNOSIS — R45851 Suicidal ideations: Secondary | ICD-10-CM | POA: Insufficient documentation

## 2020-11-25 DIAGNOSIS — F17210 Nicotine dependence, cigarettes, uncomplicated: Secondary | ICD-10-CM | POA: Diagnosis not present

## 2020-11-25 DIAGNOSIS — N898 Other specified noninflammatory disorders of vagina: Secondary | ICD-10-CM | POA: Diagnosis not present

## 2020-11-25 DIAGNOSIS — J449 Chronic obstructive pulmonary disease, unspecified: Secondary | ICD-10-CM | POA: Insufficient documentation

## 2020-11-25 LAB — CBC WITH DIFFERENTIAL/PLATELET
Abs Immature Granulocytes: 0.01 10*3/uL (ref 0.00–0.07)
Basophils Absolute: 0.1 10*3/uL (ref 0.0–0.1)
Basophils Relative: 1 %
Eosinophils Absolute: 0.7 10*3/uL — ABNORMAL HIGH (ref 0.0–0.5)
Eosinophils Relative: 11 %
HCT: 42.9 % (ref 36.0–46.0)
Hemoglobin: 14.5 g/dL (ref 12.0–15.0)
Immature Granulocytes: 0 %
Lymphocytes Relative: 34 %
Lymphs Abs: 2.1 10*3/uL (ref 0.7–4.0)
MCH: 32.4 pg (ref 26.0–34.0)
MCHC: 33.8 g/dL (ref 30.0–36.0)
MCV: 96 fL (ref 80.0–100.0)
Monocytes Absolute: 0.4 10*3/uL (ref 0.1–1.0)
Monocytes Relative: 6 %
Neutro Abs: 3 10*3/uL (ref 1.7–7.7)
Neutrophils Relative %: 48 %
Platelets: 183 10*3/uL (ref 150–400)
RBC: 4.47 MIL/uL (ref 3.87–5.11)
RDW: 14 % (ref 11.5–15.5)
WBC: 6.2 10*3/uL (ref 4.0–10.5)
nRBC: 0 % (ref 0.0–0.2)

## 2020-11-25 LAB — COMPREHENSIVE METABOLIC PANEL
ALT: 17 U/L (ref 0–44)
AST: 14 U/L — ABNORMAL LOW (ref 15–41)
Albumin: 4 g/dL (ref 3.5–5.0)
Alkaline Phosphatase: 52 U/L (ref 38–126)
Anion gap: 8 (ref 5–15)
BUN: 9 mg/dL (ref 6–20)
CO2: 25 mmol/L (ref 22–32)
Calcium: 9.3 mg/dL (ref 8.9–10.3)
Chloride: 106 mmol/L (ref 98–111)
Creatinine, Ser: 0.68 mg/dL (ref 0.44–1.00)
GFR, Estimated: >60 mL/min (ref >60)
Glucose, Bld: 93 mg/dL (ref 70–99)
Potassium: 3.4 mmol/L — ABNORMAL LOW (ref 3.5–5.1)
Sodium: 139 mmol/L (ref 135–145)
Total Bilirubin: 0.5 mg/dL (ref 0.3–1.2)
Total Protein: 6.5 g/dL (ref 6.5–8.1)

## 2020-11-25 LAB — SALICYLATE LEVEL: Salicylate Lvl: <7 mg/dL — ABNORMAL LOW (ref 7.0–30.0)

## 2020-11-25 LAB — RAPID URINE DRUG SCREEN, HOSP PERFORMED
Amphetamines: NOT DETECTED
Barbiturates: NOT DETECTED
Benzodiazepines: NOT DETECTED
Cocaine: NOT DETECTED
Opiates: NOT DETECTED
Tetrahydrocannabinol: NOT DETECTED

## 2020-11-25 LAB — HCG, QUANTITATIVE, PREGNANCY: hCG, Beta Chain, Quant, S: 1 m[IU]/mL (ref <5)

## 2020-11-25 LAB — ETHANOL: Alcohol, Ethyl (B): <10 mg/dL (ref <10)

## 2020-11-25 LAB — ACETAMINOPHEN LEVEL: Acetaminophen (Tylenol), Serum: <10 ug/mL — ABNORMAL LOW (ref 10–30)

## 2020-11-25 NOTE — [facility] Counselor (Signed)
Requested cart.
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
## 2020-11-25 NOTE — ED Notes (Addendum)
Two green bags and one patient belonging bag were taken and placed in 5-8 cabinet. Patient dressed out and TTS consult at bedside.

## 2020-11-25 NOTE — ED Notes (Signed)
Dr. Mauro Kaufmann and Cornelia Copa, TTS both made aware patient is upset she is cleared to leave. Both remain adamant that patient has been psych cleared and needs to discharge. Belongings returned to patient, however patient then said she would call someone to come get her.
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
## 2020-11-25 NOTE — ED Notes (Addendum)
Patient resting quietly in bed at this time.  Waiting for TTS consult.  Patient is calm and cooperative.  No needs voiced at this time

## 2020-11-25 NOTE — ED Provider Notes (Addendum)
[facility] DEPT Provider Note   CSN: [id number] Arrival date & time: 11/25/20  [id number]     History Chief Complaint  Patient presents with   Suicidal    Catherine Olsen is a 50 y.o. female.  Patient to ED with thoughts of suicide further described as "I'm just tired of having so many problems". She denies self harm prior to arrival. No HI/AVH. No physical complaints.   The history is provided by the patient. No language interpreter was used.      Past Medical History:  Diagnosis Date   Abnormal uterine bleeding (AUB) 06/25/2009   Qualifier: Diagnosis of  By: Cathren Laine MD, Ankit     Allergy    Angio-edema    Anxiety    Arthritis    hands, lower back, knees   Asthma    Bipolar 1 disorder ([place])    Breast cancer ([place])    stage 0   Chronic prescription benzodiazepine use 09/08/2018   COPD (chronic obstructive pulmonary disease) (HCC)    Depression    Endometriosis    Fibromyalgia    GERD (gastroesophageal reflux disease)    Headache(784.0)    otc med prn   Migraine    Pituitary tumor    microadenoma   PONV (postoperative nausea and vomiting)    PTSD (post-traumatic stress disorder)    Scoliosis    Termination of pregnancy    x 2 at age 71 and 50 yrs old   Tobacco abuse 03/04/2015   Urticaria     Patient Active Problem List   Diagnosis Date Noted   Bipolar 1 disorder, depressed, severe ([place]) 08/12/2020   Breast cancer ([place])    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   History of suicidal ideation 09/08/2018   Other allergic rhinitis 02/02/2018   Allergy, unspecified, subsequent encounter 02/02/2018   Mild intermittent asthma without complication 0000000   Angio-edema 02/02/2018   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   Gastroesophageal reflux disease without esophagitis 02/02/2018   Chronic dental pain 01/20/2018   Chronic facial pain 01/20/2018   Prolonged Q-T interval on ECG 01/02/2018   Environmental allergies 10/29/2017    Pain in joint of left shoulder 07/15/2017   Neck pain 07/15/2017   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   Chronic migraine without aura without status migrainosus, not intractable 03/24/2017   Chronic midline low back pain without sciatica 03/24/2017   Pulmonary emphysema ([place]) 02/19/2017   Jaw pain 02/11/2017   Smoking history 12/09/2016   Exertional dyspnea 12/09/2016   Family history of alpha 1 antitrypsin deficiency 09/04/2016   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   Nodule of left lung 09/04/2016   Cervical lymphadenopathy 01/29/2016   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[place])    Perimenopausal vasomotor symptoms 05/25/2015   Chronic rhinitis 04/18/2015   Pituitary microadenoma ([place]) 05/19/2014   Endometriosis of ovary 04/03/2014   Ovarian cyst, right 04/03/2014   Posttraumatic stress disorder 09/13/2012   Fibrocystic breast disease 08/24/2012   Myalgia and myositis 07/26/2012   Depression 07/26/2012   PTSD (post-traumatic stress disorder) 01/09/2009   Insomnia 01/17/2008   NEVI, MULTIPLE 07/17/2006   KERATOSIS, SEBORRHEIC NEC 07/17/2006   ACNE NEC 07/17/2006    Past Surgical History:  Procedure Laterality Date   BREAST BIOPSY Right 05/19/2007   BREAST BIOPSY Right 06/02/2007   BREAST BIOPSY  06/29/2019   BREAST EXCISIONAL BIOPSY     BREAST LUMPECTOMY Right 07/21/2019   BREAST LUMPECTOMY WITH RADIOACTIVE SEED LOCALIZATION Right 07/21/2019  Procedure: RIGHT BREAST LUMPECTOMY WITH RADIOACTIVE SEED LOCALIZATION;  Surgeon: Alphonsa Overall, MD;  Location: [facility];  Service: General;  Laterality: Right;   BREAST SURGERY     DILATION AND CURETTAGE OF UTERUS     FACIAL COSMETIC SURGERY     right cheek   LAPAROSCOPY Right 11/11/2013   Procedure: LAPAROSCOPY OPERATIVE with  Drainage of  RIGHT Ovarian ENDOMETRIOMA;  Surgeon: Elveria Royals, MD;  Location: [facility];  Service: Gynecology;  Laterality: Right;   NASAL ENDOSCOPY     said it showed some acid reflux  from ENT   NOSE SURGERY     rhinoplasty at age 23 yrs   WISDOM TOOTH EXTRACTION       OB History     Gravida  2   Para      Term      Preterm      AB  2   Living  0      SAB      IAB  2   Ectopic      Multiple      Live Births              Family History  Problem Relation Age of Onset   Bipolar disorder Mother    Diabetes Mother    Hypertension Mother    Stroke Mother 38       CVA   Mental illness Mother        no diagnosis; personality disorder   Bipolar disorder Father    Hyperlipidemia Father    Hypertension Father    COPD Father    Alpha-1 antitrypsin deficiency Father    Asthma Father    Alpha-1 antitrypsin deficiency Brother    Asthma Brother    Heart disease Maternal Grandfather    Hyperlipidemia Maternal Grandfather    Hypertension Maternal Grandfather    Asthma Maternal Grandfather    Bipolar disorder Maternal Grandmother    Depression Maternal Grandmother    Diabetes Maternal Grandmother    Heart disease Maternal Grandmother    Hyperlipidemia Maternal Grandmother    Hypertension Maternal Grandmother    Mental illness Maternal Grandmother    Heart disease Paternal Grandfather    Hyperlipidemia Paternal Grandfather    Hypertension Paternal Grandfather    Stroke Paternal Grandfather    Alzheimer's disease Paternal Grandmother    Allergic rhinitis Neg Hx    Angioedema Neg Hx    Eczema Neg Hx    Urticaria Neg Hx    Immunodeficiency Neg Hx    Colon cancer Neg Hx    Esophageal cancer Neg Hx     Social History   Tobacco Use   Smoking status: Every Day    Packs/day: 0.50    Years: 29.00    Pack years: 14.50    Types: Cigarettes    Last attempt to quit: 10/08/2019    Years since quitting: 1.1   Smokeless tobacco: Never  Vaping Use   Vaping Use: Former  Substance Use Topics   Alcohol use: Yes    Alcohol/week: 2.0 standard drinks    Types: 2 Cans of beer per week    Comment: 6 nights   Drug use: Not Currently    Types:  "Crack" cocaine    Comment: 07/19/2019    Home Medications Prior to Admission medications   Medication Sig Start Date End Date Taking? Authorizing Provider  albuterol (VENTOLIN HFA) 108 (90 Base) MCG/ACT inhaler Inhale 2 puffs into the lungs every  6 (six) hours as needed for wheezing or shortness of breath. 05/10/20  Yes Elsie Stain, MD  EPINEPHrine 0.3 mg/0.3 mL IJ SOAJ injection Inject 0.3 mg into the skin as needed for anaphylaxis. 05/10/20  Yes Elsie Stain, MD  ibuprofen (ADVIL) 800 MG tablet Take 1 tablet (800 mg total) by mouth every 8 (eight) hours as needed. 10/11/20  Yes Elsie Stain, MD  tamoxifen (NOLVADEX) 20 MG tablet Take 1 tablet (20 mg total) by mouth daily. For breast cancer 11/07/20  Yes Nicholas Lose, MD  traZODone (DESYREL) 100 MG tablet Take 1 tablet (100 mg total) by mouth at bedtime. 11/16/20  Yes Nwoko, Uchenna E, PA    Allergies    Other, Prednisone, Sulfamethoxazole, Oxycodone, Abilify [aripiprazole], Celebrex [celecoxib], Molds & smuts, Penicillins, Tylenol [acetaminophen], Lamictal [lamotrigine], Percocet [oxycodone-acetaminophen], Sulfa antibiotics, and Sulfites  Review of Systems   Review of Systems  Constitutional:  Negative for chills and fever.  HENT: Negative.    Respiratory: Negative.    Cardiovascular: Negative.   Gastrointestinal: Negative.   Musculoskeletal: Negative.   Skin: Negative.   Neurological: Negative.   Psychiatric/Behavioral:  Positive for dysphoric mood and suicidal ideas.    Physical Exam Updated Vital Signs BP (!) 122/105 (BP Location: Left Arm)   Pulse (!) 101   Temp 98.4 F (36.9 C) (Oral)   Resp 16   Ht '5\' 8"'$  (1.727 m)   Wt 61.2 kg   LMP 11/21/2014   SpO2 100%   BMI 20.53 kg/m   Physical Exam Vitals and nursing note reviewed.  Constitutional:      Appearance: She is well-developed.  HENT:     Head: Normocephalic.  Cardiovascular:     Rate and Rhythm: Normal rate.  Pulmonary:     Effort: Pulmonary effort  is normal.  Abdominal:     General: There is no distension.  Musculoskeletal:        General: Normal range of motion.     Cervical back: Normal range of motion and neck supple.  Skin:    General: Skin is warm and dry.  Neurological:     General: No focal deficit present.     Mental Status: She is alert and oriented to person, place, and time.  Psychiatric:        Attention and Perception: Attention and perception normal.        Mood and Affect: Affect is flat.        Speech: Speech is delayed.        Behavior: Behavior is cooperative.        Thought Content: Thought content includes suicidal ideation.    ED Results / Procedures / Treatments   Labs (all labs ordered are listed, but only abnormal results are displayed) Labs Reviewed  CBC WITH DIFFERENTIAL/PLATELET  COMPREHENSIVE METABOLIC PANEL  ETHANOL  SALICYLATE LEVEL  ACETAMINOPHEN LEVEL  RAPID URINE DRUG SCREEN, HOSP PERFORMED  HCG, QUANTITATIVE, PREGNANCY    EKG None  Radiology No results found.  Procedures Procedures   Medications Ordered in ED Medications - No data to display  ED Course  I have reviewed the triage vital signs and the nursing notes.  Pertinent labs & imaging results that were available during my care of the patient were reviewed by me and considered in my medical decision making (see chart for details).    MDM Rules/Calculators/A&P  Patient to ED with SI, no self harm prior to arrival.   History of Bipolar, OCD. Will request TTS evaluation for recommendation on disposition.   She is considered medically cleared for psych eval.   Final Clinical Impression(s) / ED Diagnoses Final diagnoses:  None   SI  Rx / DC Orders ED Discharge Orders     None        Charlann Lange, PA-C 11/25/20 0610    Charlann Lange, PA-C 11/25/20 [id number]    Margette Fast, MD 12/01/20 [phone]

## 2020-11-25 NOTE — ED Notes (Signed)
Patient refusing morning breakfast tray because she is a vegan.  Modified diet order to reflect this and kitchen called for new tray.

## 2020-11-26 ENCOUNTER — Other Ambulatory Visit (HOSPITAL_COMMUNITY): Payer: Medicare Other

## 2020-11-26 ENCOUNTER — Other Ambulatory Visit (HOSPITAL_COMMUNITY): Payer: Medicare Other | Admitting: Licensed Clinical Social Worker

## 2020-11-26 DIAGNOSIS — F3162 Bipolar disorder, current episode mixed, moderate: Secondary | ICD-10-CM | POA: Diagnosis not present

## 2020-11-26 DIAGNOSIS — F3160 Bipolar disorder, current episode mixed, unspecified: Secondary | ICD-10-CM

## 2020-11-26 DIAGNOSIS — G4700 Insomnia, unspecified: Secondary | ICD-10-CM

## 2020-11-26 DIAGNOSIS — F4310 Post-traumatic stress disorder, unspecified: Secondary | ICD-10-CM

## 2020-11-26 MED ORDER — TRAZODONE HCL 100 MG PO TABS: 100.0000 mg | ORAL_TABLET | Freq: Every evening | ORAL | 0 refills | Status: DC | PRN
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
## 2020-11-27 ENCOUNTER — Other Ambulatory Visit (HOSPITAL_COMMUNITY): Payer: Medicare Other

## 2020-11-27 ENCOUNTER — Other Ambulatory Visit: Payer: Self-pay

## 2020-11-28 ENCOUNTER — Other Ambulatory Visit (HOSPITAL_COMMUNITY): Payer: Medicare Other

## 2020-11-28 ENCOUNTER — Other Ambulatory Visit: Payer: Self-pay

## 2020-11-28 ENCOUNTER — Encounter (HOSPITAL_COMMUNITY): Payer: Self-pay | Admitting: Family

## 2020-11-29 ENCOUNTER — Other Ambulatory Visit (HOSPITAL_COMMUNITY): Payer: Medicare Other

## 2020-11-29 ENCOUNTER — Ambulatory Visit (HOSPITAL_COMMUNITY): Payer: Medicare Other

## 2020-11-30 ENCOUNTER — Ambulatory Visit (HOSPITAL_COMMUNITY): Payer: Medicare Other

## 2020-11-30 ENCOUNTER — Other Ambulatory Visit (HOSPITAL_COMMUNITY): Payer: Medicare Other

## 2020-12-01 DIAGNOSIS — F422 Mixed obsessional thoughts and acts: Secondary | ICD-10-CM | POA: Diagnosis not present

## 2020-12-01 DIAGNOSIS — F3181 Bipolar II disorder: Secondary | ICD-10-CM | POA: Diagnosis not present

## 2020-12-01 DIAGNOSIS — F4310 Post-traumatic stress disorder, unspecified: Secondary | ICD-10-CM | POA: Diagnosis not present

## 2020-12-04 ENCOUNTER — Other Ambulatory Visit (HOSPITAL_COMMUNITY): Payer: Self-pay | Admitting: Family

## 2020-12-04 DIAGNOSIS — G4700 Insomnia, unspecified: Secondary | ICD-10-CM

## 2020-12-05 ENCOUNTER — Encounter: Payer: Self-pay | Admitting: Hematology and Oncology

## 2020-12-05 DIAGNOSIS — F3181 Bipolar II disorder: Secondary | ICD-10-CM | POA: Diagnosis not present

## 2020-12-05 DIAGNOSIS — F4310 Post-traumatic stress disorder, unspecified: Secondary | ICD-10-CM | POA: Diagnosis not present

## 2020-12-05 DIAGNOSIS — F422 Mixed obsessional thoughts and acts: Secondary | ICD-10-CM | POA: Diagnosis not present

## 2020-12-05 DIAGNOSIS — F3162 Bipolar disorder, current episode mixed, moderate: Secondary | ICD-10-CM | POA: Diagnosis not present

## 2020-12-08 DIAGNOSIS — F422 Mixed obsessional thoughts and acts: Secondary | ICD-10-CM | POA: Diagnosis not present

## 2020-12-08 DIAGNOSIS — F4310 Post-traumatic stress disorder, unspecified: Secondary | ICD-10-CM | POA: Diagnosis not present

## 2020-12-08 DIAGNOSIS — F3181 Bipolar II disorder: Secondary | ICD-10-CM | POA: Diagnosis not present

## 2020-12-11 ENCOUNTER — Telehealth (INDEPENDENT_AMBULATORY_CARE_PROVIDER_SITE_OTHER): Payer: Medicare Other | Admitting: Physician Assistant

## 2020-12-11 ENCOUNTER — Other Ambulatory Visit: Payer: Self-pay

## 2020-12-11 DIAGNOSIS — F3160 Bipolar disorder, current episode mixed, unspecified: Secondary | ICD-10-CM | POA: Diagnosis not present

## 2020-12-11 DIAGNOSIS — F4310 Post-traumatic stress disorder, unspecified: Secondary | ICD-10-CM

## 2020-12-11 DIAGNOSIS — G4700 Insomnia, unspecified: Secondary | ICD-10-CM

## 2020-12-12 ENCOUNTER — Encounter (HOSPITAL_COMMUNITY): Payer: Self-pay | Admitting: Physician Assistant

## 2020-12-12 DIAGNOSIS — F4310 Post-traumatic stress disorder, unspecified: Secondary | ICD-10-CM | POA: Diagnosis not present

## 2020-12-12 DIAGNOSIS — F422 Mixed obsessional thoughts and acts: Secondary | ICD-10-CM | POA: Diagnosis not present

## 2020-12-12 DIAGNOSIS — F3181 Bipolar II disorder: Secondary | ICD-10-CM | POA: Diagnosis not present

## 2020-12-13 ENCOUNTER — Encounter: Payer: Self-pay | Admitting: Hematology and Oncology

## 2020-12-13 ENCOUNTER — Encounter (HOSPITAL_BASED_OUTPATIENT_CLINIC_OR_DEPARTMENT_OTHER): Payer: Self-pay | Admitting: Emergency Medicine

## 2020-12-13 ENCOUNTER — Other Ambulatory Visit: Payer: Self-pay

## 2020-12-13 DIAGNOSIS — Z853 Personal history of malignant neoplasm of breast: Secondary | ICD-10-CM | POA: Diagnosis not present

## 2020-12-13 DIAGNOSIS — J4520 Mild intermittent asthma, uncomplicated: Secondary | ICD-10-CM | POA: Insufficient documentation

## 2020-12-13 DIAGNOSIS — F17210 Nicotine dependence, cigarettes, uncomplicated: Secondary | ICD-10-CM | POA: Insufficient documentation

## 2020-12-13 DIAGNOSIS — H6692 Otitis media, unspecified, left ear: Secondary | ICD-10-CM | POA: Diagnosis not present

## 2020-12-13 DIAGNOSIS — J449 Chronic obstructive pulmonary disease, unspecified: Secondary | ICD-10-CM | POA: Diagnosis not present

## 2020-12-13 DIAGNOSIS — H9202 Otalgia, left ear: Secondary | ICD-10-CM | POA: Insufficient documentation

## 2020-12-13 NOTE — ED Triage Notes (Signed)
Pt arrives pov with c/o L ear pain and left side neck pain, and swollen lymph nodes in L cheek. Pt endorses hx of breast cancer.

## 2020-12-14 ENCOUNTER — Emergency Department (HOSPITAL_BASED_OUTPATIENT_CLINIC_OR_DEPARTMENT_OTHER)
Admission: EM | Admit: 2020-12-14 | Discharge: 2020-12-14 | Disposition: A | Discharge status: Home / Self Care | Payer: Medicare Other | Attending: Emergency Medicine | Admitting: Emergency Medicine

## 2020-12-14 DIAGNOSIS — H9202 Otalgia, left ear: Secondary | ICD-10-CM | POA: Diagnosis not present

## 2020-12-14 DIAGNOSIS — F3181 Bipolar II disorder: Secondary | ICD-10-CM | POA: Diagnosis not present

## 2020-12-14 DIAGNOSIS — F422 Mixed obsessional thoughts and acts: Secondary | ICD-10-CM | POA: Diagnosis not present

## 2020-12-14 DIAGNOSIS — H6690 Otitis media, unspecified, unspecified ear: Secondary | ICD-10-CM

## 2020-12-14 DIAGNOSIS — F4310 Post-traumatic stress disorder, unspecified: Secondary | ICD-10-CM | POA: Diagnosis not present

## 2020-12-14 MED ORDER — LEVOFLOXACIN 750 MG PO TABS
750.0000 mg | ORAL_TABLET | Freq: Once | ORAL | Status: AC
  Administered 2020-12-14: 750 mg via ORAL
  Filled 2020-12-14: qty 1

## 2020-12-14 MED ORDER — LEVOFLOXACIN 750 MG PO TABS: 750.0000 mg | ORAL_TABLET | Freq: Every day | ORAL | 0 refills | Status: DC

## 2020-12-14 NOTE — Discharge Instructions (Addendum)
Begin taking Levaquin as prescribed.  Follow-up with your primary doctor/ENT if symptoms are not improving in the next few days.

## 2020-12-14 NOTE — Progress Notes (Signed)
[facility] [place] [facility] MD/PA/NP OP Progress Note  Virtual Visit via Telephone Note  I connected with Catherine Olsen on 12/14/20 at  3:30 PM EDT by telephone and verified that I am speaking with the correct person using two identifiers.  Location: Patient: Home Provider: Clinic   I discussed the limitations, risks, security and privacy concerns of performing an evaluation and management service by telephone and the availability of in person appointments. I also discussed with the patient that there may be a patient responsible charge related to this service. The patient expressed understanding and agreed to proceed.  Follow Up Instructions:  I discussed the assessment and treatment plan with the patient. The patient was provided an opportunity to ask questions and all were answered. The patient agreed with the plan and demonstrated an understanding of the instructions.   The patient was advised to call back or seek an in-person evaluation if the symptoms worsen or if the condition fails to improve as anticipated.  I provided 30 minutes of non-face-to-face time during this encounter.  Malachy Mood, PA   12/11/2020 4:29 PM Catherine Olsen  MRN:  [id number]  Chief Complaint: Follow up and medication management  HPI:   Catherine Olsen  Visit Diagnosis:    ICD-10-CM   1. 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HCC)  F31.60     2. Insomnia, unspecified type  G47.00     3. PTSD (post-traumatic stress disorder)  F43.10       Past Psychiatric History:  Bipolar I disorder Insomnia PTSD  Past Medical History:  Past Medical History:  Diagnosis Date   Abnormal uterine bleeding (AUB) 06/25/2009   Qualifier: Diagnosis of  By: Cathren Laine MD, Ankit     Allergy    Angio-edema    Anxiety    Arthritis    hands, lower back, knees   Asthma    Bipolar 1 disorder ([place])    Breast cancer ([place])    stage 0   Chronic prescription benzodiazepine use 09/08/2018   COPD (chronic obstructive pulmonary disease) (HCC)    Depression     Endometriosis    Fibromyalgia    GERD (gastroesophageal reflux disease)    Headache(784.0)    otc med prn   Migraine    Pituitary tumor    microadenoma   PONV (postoperative nausea and vomiting)    PTSD (post-traumatic stress disorder)    Scoliosis    Termination of pregnancy    x 2 at age 15 and 50 yrs old   Tobacco abuse 03/04/2015   Urticaria     Past Surgical History:  Procedure Laterality Date   BREAST BIOPSY Right 05/19/2007   BREAST BIOPSY Right 06/02/2007   BREAST BIOPSY  06/29/2019   BREAST EXCISIONAL BIOPSY     BREAST LUMPECTOMY Right 07/21/2019   BREAST LUMPECTOMY WITH RADIOACTIVE SEED LOCALIZATION Right 07/21/2019   Procedure: RIGHT BREAST LUMPECTOMY WITH RADIOACTIVE SEED LOCALIZATION;  Surgeon: Alphonsa Overall, MD;  Location: [facility];  Service: General;  Laterality: Right;   BREAST SURGERY     DILATION AND CURETTAGE OF UTERUS     FACIAL COSMETIC SURGERY     right cheek   LAPAROSCOPY Right 11/11/2013   Procedure: LAPAROSCOPY OPERATIVE with  Drainage of  RIGHT Ovarian ENDOMETRIOMA;  Surgeon: Elveria Royals, MD;  Location: [facility];  Service: Gynecology;  Laterality: Right;   NASAL ENDOSCOPY     said it showed some acid reflux from ENT   NOSE SURGERY  rhinoplasty at age 87 yrs   WISDOM TOOTH EXTRACTION      Family Psychiatric History:  Patient reports that her Grandmother had Alzheimer's and her father currently has Alzheimer's. Patient states that her great grandmother was hospitalized for manic/depressive behavior. Patient states that her grandmother was also manic depressive.  Family History:  Family History  Problem Relation Age of Onset   Bipolar disorder Mother    Diabetes Mother    Hypertension Mother    Stroke Mother 70       CVA   Mental illness Mother        no diagnosis; personality disorder   Bipolar disorder Father    Hyperlipidemia Father    Hypertension Father    COPD Father    Alpha-1 antitrypsin deficiency Father     Asthma Father    Alpha-1 antitrypsin deficiency Brother    Asthma Brother    Heart disease Maternal Grandfather    Hyperlipidemia Maternal Grandfather    Hypertension Maternal Grandfather    Asthma Maternal Grandfather    Bipolar disorder Maternal Grandmother    Depression Maternal Grandmother    Diabetes Maternal Grandmother    Heart disease Maternal Grandmother    Hyperlipidemia Maternal Grandmother    Hypertension Maternal Grandmother    Mental illness Maternal Grandmother    Heart disease Paternal Grandfather    Hyperlipidemia Paternal Grandfather    Hypertension Paternal Grandfather    Stroke Paternal Grandfather    Alzheimer's disease Paternal Grandmother    Allergic rhinitis Neg Hx    Angioedema Neg Hx    Eczema Neg Hx    Urticaria Neg Hx    Immunodeficiency Neg Hx    Colon cancer Neg Hx    Esophageal cancer Neg Hx     Social History:  Social History   Socioeconomic History   Marital status: Divorced    Spouse name: Not on file   Number of children: 0   Years of education: Not on file   Highest education level: Bachelor's degree (e.g., BA, AB, BS)  Occupational History   Occupation: disability    Comment: mental illness  Tobacco Use   Smoking status: Every Day    Packs/day: 0.50    Years: 29.00    Pack years: 14.50    Types: Cigarettes    Last attempt to quit: 10/08/2019    Years since quitting: 1.1   Smokeless tobacco: Never  Vaping Use   Vaping Use: Former  Substance and Sexual Activity   Alcohol use: Yes    Alcohol/week: 2.0 standard drinks    Types: 2 Cans of beer per week    Comment: 6 nights   Drug use: Not Currently    Types: "Crack" cocaine    Comment: 07/19/2019   Sexual activity: Not Currently    Comment: abortion at 83 and 29yr. of age   Other Topics Concern   Not on file  Social History Narrative   Marital status: divorced; not dating      Children: none      Lives: alone with mom      Employment: disability for mental illness in  122  Hospitalizations x 9 in past.      Tobacco: 1 ppd x since age 50 Decreasing in 2018.      Alcohol: socially; beers.      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      Exercise: yoga daily; exercise daily.      ADLs: independent  with ADLs; no car; depends on others for transportation.      Patient is right-handed. She is divorced and lives with her mother. She drinks 2-3 cups of 1/2 caffeine coffe a day. She walks occasionally for exercise.   Social Determinants of Health   Financial Resource Strain: Not on file  Food Insecurity: Not on file  Transportation Needs: Not on file  Physical Activity: Not on file  Stress: Not on file  Social Connections: Not on file    Allergies:  Allergies  Allergen Reactions   Other Anaphylaxis    Idiopathic (possible binder or preservatives in medication)     Prednisone Anaphylaxis    Steroids Steroids ( can take with benadryl )   Can not take higher than 40 mg    Sulfamethoxazole Anaphylaxis   Oxycodone Other (See Comments)    Severe blindness?   Abilify [Aripiprazole]     Generic Abilify--causes severe neurological problems. Can use name brand   Celebrex [Celecoxib]    Molds & Smuts Other (See Comments)    Environmental   Penicillins Hives    Has patient had a PCN reaction causing immediate rash, facial/tongue/throat swelling, SOB or lightheadedness with hypotension: no Has patient had a PCN reaction causing severe rash involving mucus membranes or skin necrosis: NO Has patient had a PCN reaction that required hospitalizationNO Has patient had a PCN reaction occurring within the last 10 years:NO If all of the above answers are "NO", then may proceed with Cephalosporin use.    Tylenol [Acetaminophen]     Stomach pain   Lamictal [Lamotrigine] Rash   Percocet [Oxycodone-Acetaminophen]     Vision loss   Sulfa Antibiotics Hives    Pt states she is not really allergic to sulfa, but to sulfites.   Sulfites Other  (See Comments)    unknown    Metabolic Disorder Labs: Lab Results  Component Value Date   HGBA1C 5.3 08/12/2020   MPG 105.41 08/12/2020   MPG 97 01/21/2020   Lab Results  Component Value Date   PROLACTIN 8.2 08/12/2020   PROLACTIN 12.0 06/20/2020   Lab Results  Component Value Date   CHOL 167 08/12/2020   TRIG 73 08/12/2020   HDL 63 08/12/2020   CHOLHDL 2.7 08/12/2020   VLDL 15 08/12/2020   LDLCALC 89 08/12/2020   LDLCALC 83 05/10/2020   Lab Results  Component Value Date   TSH 2.650 08/12/2020   TSH 0.828 01/21/2020    Therapeutic Level Labs: No results found for: LITHIUM Lab Results  Component Value Date   VALPROATE 50 01/21/2020   No components found for:  CBMZ  Current Medications: Current Outpatient Medications  Medication Sig Dispense Refill   albuterol (VENTOLIN HFA) 108 (90 Base) MCG/ACT inhaler Inhale 2 puffs into the lungs every 6 (six) hours as needed for wheezing or shortness of breath. 8 g 3   EPINEPHrine 0.3 mg/0.3 mL IJ SOAJ injection Inject 0.3 mg into the skin as needed for anaphylaxis. 1 each 0   ibuprofen (ADVIL) 800 MG tablet Take 1 tablet (800 mg total) by mouth every 8 (eight) hours as needed. 90 tablet 1   levofloxacin (LEVAQUIN) 750 MG tablet Take 1 tablet (750 mg total) by mouth daily. X 7 days 14 tablet 0   tamoxifen (NOLVADEX) 20 MG tablet Take 1 tablet (20 mg total) by mouth daily. For breast cancer 90 tablet 3   traZODone (DESYREL) 100 MG tablet Take 1 tablet (100 mg total) by mouth at bedtime as  needed and may repeat dose one time if needed for sleep. 60 tablet 0   No current facility-administered medications for this visit.     Musculoskeletal: Strength & Muscle Tone: Unable to assess due to telemedicine [place]: Unable to assess due to telemedicine Patient leans: Unable to assess due to telemedicine  Psychiatric Specialty Exam: Review of Systems  Psychiatric/Behavioral:  Positive for agitation, decreased concentration  and sleep disturbance. Negative for dysphoric mood, hallucinations, self-injury and suicidal ideas. The patient is nervous/anxious. The patient is not hyperactive.    Last menstrual period 11/21/2014.There is no height or weight on file to calculate BMI.  General Appearance: Unable to assess due to telemedicine  Eye Contact:  Unable to assess due to telemedicine  Speech:  Clear and Coherent and Normal Rate  Volume:  Normal  Mood:  Anxious, Depressed, and Irritable  Affect:  Congruent, Depressed, Inappropriate, and Labile  Thought Process:  Coherent and Descriptions of Associations: Intact  Orientation:  Full (Time, Place, and Person)  Thought Content: WDL   Suicidal Thoughts:  No  Homicidal Thoughts:  No  Memory:  Immediate;   Good Recent;   Fair Remote;   Fair  Judgement:  Fair  Insight:  Fair  Psychomotor Activity:  Restlessness  Concentration:  Concentration: Fair and Attention Span: Fair  Recall:  [employer] of Knowledge: Fair  Language: Fair  Akathisia:  NA  Handed:  Right  AIMS (if indicated): not done  Assets:  Communication Skills Desire for Improvement Housing  ADL's:  Intact  Cognition: WNL  Sleep:  Fair   Screenings: AIMS    Flowsheet Row Admission (Discharged) from 08/12/2020 in [facility] Admission (Discharged) from 09/08/2018 in [facility] Total Score 0 0      AUDIT    [place] Admission (Discharged) from 08/12/2020 in [facility] Admission (Discharged) from 09/08/2018 in [facility] Admission (Discharged) from 09/12/2012 in [facility]  Alcohol Use Disorder Identification Test Final Score (AUDIT) 0 0 0      GAD-7    Flowsheet Row Office Visit from 09/07/2020 in [facility]  Total GAD-7 Score 17      PHQ2-9    Flowsheet Row Video Visit from 12/11/2020 in [facility] ED from 11/25/2020 in [facility] Counselor from 11/15/2020 in [facility] Video Visit from 10/05/2020 in [facility] Office Visit from 09/07/2020 in [facility]  PHQ-2 Total Score '5 6 6 6 6  '$ PHQ-9 Total Score '18 20 21 21 20      '$ Flowsheet Row Video Visit from 12/11/2020 in [facility] ED from 11/25/2020 in [facility] ED from 10/11/2020 in [facility]  C-SSRS RISK CATEGORY Moderate Risk Low Risk No Risk        Assessment and Plan:   Catherine Olsen  1. Mixed bipolar I disorder [facility]) Patient reports that she was prescribed Vraylar from her psychiatrist but is holding off on taking the medication until she knows if it counteracts with her other medications  2. Insomnia, unspecified type And is continuing to take trazodone for the management of her insomnia  3. PTSD (post-traumatic stress disorder)   Patient to follow up in 6 weeks Provider spent a total of 30 minutes was spent  with the patient  Malachy Mood, PA 12/14/2020, 9:07 PM

## 2020-12-14 NOTE — ED Notes (Signed)
ED Provider at bedside. 

## 2020-12-14 NOTE — ED Provider Notes (Signed)
[facility] EMERGENCY DEPT Provider Note   CSN: [id number] Arrival date & time: 12/13/20  1954     History Chief Complaint  Patient presents with   Otalgia    Catherine Olsen is a 50 y.o. female.  Patient is a 50 year old female presenting with complaints of left-sided facial and ear pain.  This has been present for the past 3 days and is worsening.  She denies fevers or chills.  She denies hearing loss.  Patient describes a similar episode in February of this year for which she was treated with Levaquin.  The history is provided by the patient.  Otalgia Location:  Left Behind ear:  No abnormality Severity:  Severe Onset quality:  Gradual Duration:  3 days Timing:  Constant Progression:  Worsening Chronicity:  Recurrent     Past Medical History:  Diagnosis Date   Abnormal uterine bleeding (AUB) 06/25/2009   Qualifier: Diagnosis of  By: Cathren Laine MD, Ankit     Allergy    Angio-edema    Anxiety    Arthritis    hands, lower back, knees   Asthma    Bipolar 1 disorder ([place])    Breast cancer ([place])    stage 0   Chronic prescription benzodiazepine use 09/08/2018   COPD (chronic obstructive pulmonary disease) (HCC)    Depression    Endometriosis    Fibromyalgia    GERD (gastroesophageal reflux disease)    Headache(784.0)    otc med prn   Migraine    Pituitary tumor    microadenoma   PONV (postoperative nausea and vomiting)    PTSD (post-traumatic stress disorder)    Scoliosis    Termination of pregnancy    x 2 at age 75 and 50 yrs old   Tobacco abuse 03/04/2015   Urticaria     Patient Active Problem List   Diagnosis Date Noted   Bipolar 1 disorder, depressed, severe ([place]) 08/12/2020   Breast cancer ([place])    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   History of suicidal ideation 09/08/2018   Other allergic rhinitis 02/02/2018   Allergy, unspecified, subsequent encounter 02/02/2018   Mild intermittent asthma without complication  0000000   Angio-edema 02/02/2018   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   Gastroesophageal reflux disease without esophagitis 02/02/2018   Chronic dental pain 01/20/2018   Chronic facial pain 01/20/2018   Prolonged Q-T interval on ECG 01/02/2018   Environmental allergies 10/29/2017   Pain in joint of left shoulder 07/15/2017   Neck pain 07/15/2017   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   Chronic migraine without aura without status migrainosus, not intractable 03/24/2017   Chronic midline low back pain without sciatica 03/24/2017   Pulmonary emphysema ([place]) 02/19/2017   Jaw pain 02/11/2017   Smoking history 12/09/2016   Exertional dyspnea 12/09/2016   Family history of alpha 1 antitrypsin deficiency 09/04/2016   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   Nodule of left lung 09/04/2016   Cervical lymphadenopathy 01/29/2016   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[place])    Perimenopausal vasomotor symptoms 05/25/2015   Chronic rhinitis 04/18/2015   Pituitary microadenoma ([place]) 05/19/2014   Endometriosis of ovary 04/03/2014   Ovarian cyst, right 04/03/2014   Posttraumatic stress disorder 09/13/2012   Fibrocystic breast disease 08/24/2012   Myalgia and myositis 07/26/2012   Depression 07/26/2012   PTSD (post-traumatic stress disorder) 01/09/2009   Insomnia 01/17/2008   NEVI, MULTIPLE 07/17/2006   KERATOSIS, SEBORRHEIC NEC 07/17/2006   ACNE NEC 07/17/2006    Past Surgical  History:  Procedure Laterality Date   BREAST BIOPSY Right 05/19/2007   BREAST BIOPSY Right 06/02/2007   BREAST BIOPSY  06/29/2019   BREAST EXCISIONAL BIOPSY     BREAST LUMPECTOMY Right 07/21/2019   BREAST LUMPECTOMY WITH RADIOACTIVE SEED LOCALIZATION Right 07/21/2019   Procedure: RIGHT BREAST LUMPECTOMY WITH RADIOACTIVE SEED LOCALIZATION;  Surgeon: Alphonsa Overall, MD;  Location: [facility];  Service: General;  Laterality: Right;   BREAST SURGERY     DILATION AND CURETTAGE OF UTERUS     FACIAL COSMETIC  SURGERY     right cheek   LAPAROSCOPY Right 11/11/2013   Procedure: LAPAROSCOPY OPERATIVE with  Drainage of  RIGHT Ovarian ENDOMETRIOMA;  Surgeon: Elveria Royals, MD;  Location: [facility];  Service: Gynecology;  Laterality: Right;   NASAL ENDOSCOPY     said it showed some acid reflux from ENT   NOSE SURGERY     rhinoplasty at age 18 yrs   WISDOM TOOTH EXTRACTION       OB History     Gravida  2   Para      Term      Preterm      AB  2   Living  0      SAB      IAB  2   Ectopic      Multiple      Live Births              Family History  Problem Relation Age of Onset   Bipolar disorder Mother    Diabetes Mother    Hypertension Mother    Stroke Mother 47       CVA   Mental illness Mother        no diagnosis; personality disorder   Bipolar disorder Father    Hyperlipidemia Father    Hypertension Father    COPD Father    Alpha-1 antitrypsin deficiency Father    Asthma Father    Alpha-1 antitrypsin deficiency Brother    Asthma Brother    Heart disease Maternal Grandfather    Hyperlipidemia Maternal Grandfather    Hypertension Maternal Grandfather    Asthma Maternal Grandfather    Bipolar disorder Maternal Grandmother    Depression Maternal Grandmother    Diabetes Maternal Grandmother    Heart disease Maternal Grandmother    Hyperlipidemia Maternal Grandmother    Hypertension Maternal Grandmother    Mental illness Maternal Grandmother    Heart disease Paternal Grandfather    Hyperlipidemia Paternal Grandfather    Hypertension Paternal Grandfather    Stroke Paternal Grandfather    Alzheimer's disease Paternal Grandmother    Allergic rhinitis Neg Hx    Angioedema Neg Hx    Eczema Neg Hx    Urticaria Neg Hx    Immunodeficiency Neg Hx    Colon cancer Neg Hx    Esophageal cancer Neg Hx     Social History   Tobacco Use   Smoking status: Every Day    Packs/day: 0.50    Years: 29.00    Pack years: 14.50    Types: Cigarettes    Last attempt to  quit: 10/08/2019    Years since quitting: 1.1   Smokeless tobacco: Never  Vaping Use   Vaping Use: Former  Substance Use Topics   Alcohol use: Yes    Alcohol/week: 2.0 standard drinks    Types: 2 Cans of beer per week    Comment: 6 nights   Drug use: Not  Currently    Types: "Crack" cocaine    Comment: 07/19/2019    Home Medications Prior to Admission medications   Medication Sig Start Date End Date Taking? Authorizing Provider  albuterol (VENTOLIN HFA) 108 (90 Base) MCG/ACT inhaler Inhale 2 puffs into the lungs every 6 (six) hours as needed for wheezing or shortness of breath. 05/10/20   Elsie Stain, MD  EPINEPHrine 0.3 mg/0.3 mL IJ SOAJ injection Inject 0.3 mg into the skin as needed for anaphylaxis. 05/10/20   Elsie Stain, MD  ibuprofen (ADVIL) 800 MG tablet Take 1 tablet (800 mg total) by mouth every 8 (eight) hours as needed. 10/11/20   Elsie Stain, MD  tamoxifen (NOLVADEX) 20 MG tablet Take 1 tablet (20 mg total) by mouth daily. For breast cancer 11/07/20   Nicholas Lose, MD  traZODone (DESYREL) 100 MG tablet Take 1 tablet (100 mg total) by mouth at bedtime as needed and may repeat dose one time if needed for sleep. 11/26/20   Derrill Center, NP    Allergies    Other, Prednisone, Sulfamethoxazole, Oxycodone, Abilify [aripiprazole], Celebrex [celecoxib], Molds & smuts, Penicillins, Tylenol [acetaminophen], Lamictal [lamotrigine], Percocet [oxycodone-acetaminophen], Sulfa antibiotics, and Sulfites  Review of Systems   Review of Systems  HENT:  Positive for ear pain.   All other systems reviewed and are negative.  Physical Exam Updated Vital Signs BP (!) 126/96   Pulse 73   Temp 98.2 F (36.8 C) (Oral)   Resp 18   Ht '5\' 7"'$  (1.702 m)   Wt 61.2 kg   LMP 11/21/2014   SpO2 98%   BMI 21.14 kg/m   Physical Exam Vitals and nursing note reviewed.  Constitutional:      General: She is not in acute distress.    Appearance: Normal appearance. She is not ill-appearing.   HENT:     Head: Normocephalic and atraumatic.     Right Ear: Tympanic membrane, ear canal and external ear normal.     Ears:     Comments: There is tenderness with manipulation of the pinna, but there is no obvious abnormality with the eardrum or ear canal.  There are tiny lymph nodes anterior to the ear that are palpable and tender. Pulmonary:     Effort: Pulmonary effort is normal.  Skin:    General: Skin is warm and dry.  Neurological:     Mental Status: She is alert.    ED Results / Procedures / Treatments   Labs (all labs ordered are listed, but only abnormal results are displayed) Labs Reviewed - No data to display  EKG None  Radiology No results found.  Procedures Procedures   Medications Ordered in ED Medications  levofloxacin (LEVAQUIN) tablet 750 mg (has no administration in time range)    ED Course  I have reviewed the triage vital signs and the nursing notes.  Pertinent labs & imaging results that were available during my care of the patient were reviewed by me and considered in my medical decision making (see chart for details).    MDM Rules/Calculators/A&P  Patient to be treated with Levaquin as this has helped her in the past.  She is to follow-up with her primary doctor/ENT if not improving.  Final Clinical Impression(s) / ED Diagnoses Final diagnoses:  None    Rx / DC Orders ED Discharge Orders     None        Veryl Speak, MD 12/14/20 0225

## 2020-12-18 ENCOUNTER — Other Ambulatory Visit: Payer: Self-pay | Admitting: Otolaryngology

## 2020-12-18 ENCOUNTER — Other Ambulatory Visit (HOSPITAL_COMMUNITY): Payer: Self-pay | Admitting: Otolaryngology

## 2020-12-18 DIAGNOSIS — D117 Benign neoplasm of other major salivary glands: Secondary | ICD-10-CM | POA: Diagnosis not present

## 2020-12-18 DIAGNOSIS — K118 Other diseases of salivary glands: Secondary | ICD-10-CM

## 2020-12-18 DIAGNOSIS — J301 Allergic rhinitis due to pollen: Secondary | ICD-10-CM | POA: Diagnosis not present

## 2020-12-18 DIAGNOSIS — H6983 Other specified disorders of Eustachian tube, bilateral: Secondary | ICD-10-CM | POA: Diagnosis not present

## 2020-12-18 DIAGNOSIS — R221 Localized swelling, mass and lump, neck: Secondary | ICD-10-CM | POA: Diagnosis not present

## 2020-12-18 DIAGNOSIS — L03211 Cellulitis of face: Secondary | ICD-10-CM | POA: Diagnosis not present

## 2020-12-19 ENCOUNTER — Other Ambulatory Visit: Payer: Self-pay

## 2020-12-19 ENCOUNTER — Encounter: Payer: Self-pay | Admitting: Hematology and Oncology

## 2020-12-19 ENCOUNTER — Ambulatory Visit
Admission: RE | Admit: 2020-12-19 | Discharge: 2020-12-19 | Disposition: A | Discharge status: Home / Self Care | Payer: Medicare Other | Source: Ambulatory Visit | Attending: Hematology and Oncology | Admitting: Hematology and Oncology

## 2020-12-19 DIAGNOSIS — D0511 Intraductal carcinoma in situ of right breast: Secondary | ICD-10-CM

## 2020-12-19 DIAGNOSIS — N6489 Other specified disorders of breast: Secondary | ICD-10-CM | POA: Diagnosis not present

## 2020-12-19 MED ORDER — GADOBUTROL 1 MMOL/ML IV SOLN
6.0000 mL | Freq: Once | INTRAVENOUS | Status: AC | PRN
  Administered 2020-12-19: 6 mL via INTRAVENOUS

## 2020-12-19 NOTE — Progress Notes (Signed)
Patient Care Team: Elsie Stain, MD as PCP - General (Pulmonary Disease) Services, [facility] as Referring Physician Christen Butter, MD as Referring Physician (Gynecology) Melida Quitter, MD as Consulting Physician (Otolaryngology) Loretta Plume as Consulting Physician (Neurosurgery)  DIAGNOSIS:    ICD-10-CM   1. Ductal carcinoma in situ (DCIS) of right breast  D05.11       SUMMARY OF ONCOLOGIC HISTORY: Oncology History  Ductal carcinoma in situ (DCIS) of right breast  06/29/2019 Initial Diagnosis   Right breast biopsy: Fibrocystic changes, pseudoangiomatous stromal hyperplasia   07/21/2019 Surgery   Right lumpectomy: Dr. Lucia Gaskins: Intermediate grade DCIS 0.2 cm, 0.1 cm from anterior margin, LCIS, ER 95%, PR 30% Tis NX stage 0   07/21/2019 Cancer Staging   Staging form: Breast, AJCC 8th Edition - Pathologic stage from 07/21/2019: Stage 0 (pTis (DCIS), pN0, cM0, ER+, PR+) - Signed by Gardenia Phlegm, NP on 08/03/2019   07/05/2020 -  Anti-estrogen oral therapy   10 mg of tamoxifen daily      CHIEF COMPLIANT: Follow-up of right breast DCIS on tamoxifen  INTERVAL HISTORY: Catherine Olsen is a 50 y.o. with above-mentioned history of breast DCIS who underwent a right lumpectomy, and has declined radiation and antiestrogen therapy. She presents to the clinic today for follow-up.  She had a recent breast MRI that showed interval lesion at 8:30 position in the right breast.  This will need to be biopsied.  ALLERGIES:  is allergic to other, prednisone, sulfamethoxazole, oxycodone, abilify [aripiprazole], celebrex [celecoxib], molds & smuts, penicillins, tylenol [acetaminophen], lamictal [lamotrigine], percocet [oxycodone-acetaminophen], sulfa antibiotics, and sulfites.  MEDICATIONS:  Current Outpatient Medications  Medication Sig Dispense Refill   albuterol (VENTOLIN HFA) 108 (90 Base) MCG/ACT inhaler Inhale 2 puffs into the lungs every 6 (six) hours as needed for  wheezing or shortness of breath. 8 g 3   EPINEPHrine 0.3 mg/0.3 mL IJ SOAJ injection Inject 0.3 mg into the skin as needed for anaphylaxis. 1 each 0   ibuprofen (ADVIL) 800 MG tablet Take 1 tablet (800 mg total) by mouth every 8 (eight) hours as needed. 90 tablet 1   levofloxacin (LEVAQUIN) 750 MG tablet Take 1 tablet (750 mg total) by mouth daily. X 7 days 14 tablet 0   tamoxifen (NOLVADEX) 20 MG tablet Take 1 tablet (20 mg total) by mouth daily. For breast cancer 90 tablet 3   traZODone (DESYREL) 100 MG tablet Take 1 tablet (100 mg total) by mouth at bedtime as needed and may repeat dose one time if needed for sleep. 60 tablet 0   No current facility-administered medications for this visit.    PHYSICAL EXAMINATION: ECOG PERFORMANCE STATUS: 1 - Symptomatic but completely ambulatory  Vitals:   12/20/20 1151  BP: 109/80  Pulse: (!) 106  Resp: 18  Temp: 97.9 F (36.6 C)  SpO2: 99%   Filed Weights   12/20/20 1151  Weight: 138 lb 14.4 oz (63 kg)      LABORATORY DATA:  I have reviewed the data as listed CMP Latest Ref Rng & Units 11/25/2020 08/12/2020 06/21/2020  Glucose 70 - 99 mg/dL 93 89 75  BUN 6 - 20 mg/dL '9 8 11  '$ Creatinine 0.44 - 1.00 mg/dL 0.68 0.74 0.77  Sodium 135 - 145 mmol/L 139 139 140  Potassium 3.5 - 5.1 mmol/L 3.4(L) 3.7 3.9  Chloride 98 - 111 mmol/L 106 108 107  CO2 22 - 32 mmol/L '25 25 27  '$ Calcium 8.9 - 10.3 mg/dL 9.3  9.5 9.1  Total Protein 6.5 - 8.1 g/dL 6.5 6.7 7.0  Total Bilirubin 0.3 - 1.2 mg/dL 0.5 0.4 0.3  Alkaline Phos 38 - 126 U/L 52 71 75  AST 15 - 41 U/L 14(L) 15 24  ALT 0 - 44 U/L 17 14 37    Lab Results  Component Value Date   WBC 6.2 11/25/2020   HGB 14.5 11/25/2020   HCT 42.9 11/25/2020   MCV 96.0 11/25/2020   PLT 183 11/25/2020   NEUTROABS 3.0 11/25/2020    ASSESSMENT & PLAN:  Ductal carcinoma in situ (DCIS) of right breast Right lumpectomy: Dr. Lucia Gaskins: Intermediate grade DCIS 0.2 cm, 0.1 cm from anterior margin, LCIS, ER 95%, PR  30% Tis NX stage 0  07/06/20: Right Lumpectomy: 0.2 cm IG DCIS Margins Neg, ER /PR Positive Treatment plan: Adjuvant radiation therapy: Patient discussed with Dr. Sondra Come and decided against doing radiation.  ___________________________________________________________________________ Maxillofacial CT scan 11/22/2019: Normal  Dr. Dorethea Clan is her psychiatrist   Breast Cancer Surveillance: Mammogram 10/26/2019: Benign breast density category D Breast MRI 06/29/2020: Interval 5.6 x 3.6 x 2.9 cm area of low-grade ductal enhancement in the upper half of right breast.   Patient decided not to undergo surgery for this and is currently on tamoxifen.   Tamoxifen Toxicities:None   Breast MRI 12/19/2020: New 1 cm non-mass enhancement 8:30 position right breast, no significant change in the ring enhancement 2 areas as before.  Mild improvement in the low-grade ductal enhancement in the retroareolar right breast. Patient does not want to do surgery no matter what.  If there is evidence of progression we will consider switching her to aromatase centimeter therapy.   Plan: Schedule MRI guided biopsy of right breast Return to clinic based on the biopsy result.  If the biopsy is benign then she will remain on tamoxifen.   No orders of the defined types were placed in this encounter.  The patient has a good understanding of the overall plan. she agrees with it. she will call with any problems that may develop before the next visit here.  Total time spent: 20 mins including face to face time and time spent for planning, charting and coordination of care  Rulon Eisenmenger, MD, MPH 12/20/2020  I, Thana Ates, am acting as scribe for Dr. Nicholas Lose.  I have reviewed the above documentation for accuracy and completeness, and I agree with the above.

## 2020-12-20 ENCOUNTER — Inpatient Hospital Stay: Payer: Medicare Other | Attending: Hematology and Oncology | Admitting: Hematology and Oncology

## 2020-12-20 DIAGNOSIS — Z79899 Other long term (current) drug therapy: Secondary | ICD-10-CM | POA: Insufficient documentation

## 2020-12-20 DIAGNOSIS — D0511 Intraductal carcinoma in situ of right breast: Secondary | ICD-10-CM | POA: Insufficient documentation

## 2020-12-20 DIAGNOSIS — Z79810 Long term (current) use of selective estrogen receptor modulators (SERMs): Secondary | ICD-10-CM | POA: Insufficient documentation

## 2020-12-20 DIAGNOSIS — F4310 Post-traumatic stress disorder, unspecified: Secondary | ICD-10-CM | POA: Diagnosis not present

## 2020-12-20 DIAGNOSIS — F3181 Bipolar II disorder: Secondary | ICD-10-CM | POA: Diagnosis not present

## 2020-12-20 DIAGNOSIS — F422 Mixed obsessional thoughts and acts: Secondary | ICD-10-CM | POA: Diagnosis not present

## 2020-12-20 NOTE — Assessment & Plan Note (Signed)
Right lumpectomy: Dr. Lucia Gaskins: Intermediate grade DCIS 0.2 cm, 0.1 cm from anterior margin, LCIS, ER 95%, PR 30% Tis NX stage 0 07/06/20: Right Lumpectomy: 0.2 cm IG DCIS Margins Neg, ER /PR Positive Treatment plan: Adjuvant radiation therapy: Patient discussed with Dr. Sondra Come anddecided against doing radiation. ___________________________________________________________________________ Maxillofacial CT scan 11/22/2019: Normal Dr. Dorethea Clan is her psychiatrist  Breast Cancer Surveillance: Mammogram 10/26/2019: Benign breast density category D Breast MRI 06/29/2020: Interval 5.6 x 3.6 x 2.9 cm area of low-grade ductal enhancement in the upper half of right breast. Patient decided not to undergo surgery for this and is currently on tamoxifen.  Tamoxifen Toxicities:None  Breast MRI 12/19/2020: New 1 cm non-mass enhancement 8:30 position right breast, no significant change in the ring enhancement 2 areas as before.  Mild improvement in the low-grade ductal enhancement in the retroareolar right breast. Patient does not want to do surgery no matter what.  If there is evidence of progression we will consider switching her to aromatase centimeter therapy.  Plan: Schedule MRI guided biopsy of right breast

## 2020-12-21 ENCOUNTER — Other Ambulatory Visit: Payer: Self-pay | Admitting: Hematology and Oncology

## 2020-12-21 DIAGNOSIS — R9389 Abnormal findings on diagnostic imaging of other specified body structures: Secondary | ICD-10-CM

## 2020-12-21 DIAGNOSIS — F3162 Bipolar disorder, current episode mixed, moderate: Secondary | ICD-10-CM | POA: Diagnosis not present

## 2020-12-22 ENCOUNTER — Other Ambulatory Visit: Payer: Self-pay

## 2020-12-22 ENCOUNTER — Ambulatory Visit (HOSPITAL_COMMUNITY)
Admission: EM | Admit: 2020-12-22 | Discharge: 2020-12-22 | Disposition: A | Discharge status: Home / Self Care | Payer: Medicare Other | Attending: Psychiatry | Admitting: Psychiatry

## 2020-12-22 DIAGNOSIS — Z91410 Personal history of adult physical and sexual abuse: Secondary | ICD-10-CM | POA: Insufficient documentation

## 2020-12-22 DIAGNOSIS — F22 Delusional disorders: Secondary | ICD-10-CM | POA: Insufficient documentation

## 2020-12-22 DIAGNOSIS — F410 Panic disorder [episodic paroxysmal anxiety] without agoraphobia: Secondary | ICD-10-CM | POA: Diagnosis not present

## 2020-12-22 DIAGNOSIS — F419 Anxiety disorder, unspecified: Secondary | ICD-10-CM | POA: Insufficient documentation

## 2020-12-22 NOTE — Discharge Instructions (Addendum)

## 2020-12-22 NOTE — BH Assessment (Addendum)
Catherine Olsen, Routine, MR #[id number]; 50 years old present this date unaccompanied at [facility].  Pt denies SI, HI, or AVH.  Pt reports "I feel like someone is investigating me, I am having a panic attack".  Pt admits to prior MH diagnosis; also,  taking prescribed medication  for symptom management.
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
## 2020-12-22 NOTE — Discharge Summary (Signed)
[name] to be D/C'd Home per NP order. Discussed with the patient and all questions fully answered. An After Visit Summary was printed and given to the patient. Patient escorted out and D/C home via private auto.  [name]  12/22/2020 11:48 AM

## 2020-12-27 ENCOUNTER — Ambulatory Visit: Payer: Medicare Other | Admitting: Allergy & Immunology

## 2020-12-27 ENCOUNTER — Ambulatory Visit (HOSPITAL_COMMUNITY): Payer: No Typology Code available for payment source

## 2020-12-27 NOTE — Psych (Signed)
Virtual Visit via Video Note  I connected with Catherine Olsen on 11/15/20 at  9:00 AM EDT by a video enabled telemedicine application and verified that I am speaking with the correct person using two identifiers.  Location: Patient: patient home Provider: clinical home office   I discussed the limitations of evaluation and management by telemedicine and the availability of in person appointments. The patient expressed understanding and agreed to proceed.  I discussed the assessment and treatment plan with the patient. The patient was provided an opportunity to ask questions and all were answered. The patient agreed with the plan and demonstrated an understanding of the instructions.   The patient was advised to call back or seek an in-person evaluation if the symptoms worsen or if the condition fails to improve as anticipated.  Cln and pt completed treatment plan and pt stated verbal alignment with plan. Pt gave verbal consent to treatment and virtual treatment, agreement with group commitments, and permission to release information for purposes of any requested paperwork.    I provided 10 minutes of non-face-to-face time during this encounter.   Lorin Glass, LCSW
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
## 2020-12-28 ENCOUNTER — Ambulatory Visit (HOSPITAL_COMMUNITY)
Admission: RE | Admit: 2020-12-28 | Discharge: 2020-12-28 | Disposition: A | Discharge status: Home / Self Care | Payer: Medicare Other | Source: Ambulatory Visit | Attending: Otolaryngology | Admitting: Otolaryngology

## 2020-12-28 DIAGNOSIS — K118 Other diseases of salivary glands: Secondary | ICD-10-CM | POA: Diagnosis not present

## 2020-12-28 DIAGNOSIS — D110 Benign neoplasm of parotid gland: Secondary | ICD-10-CM | POA: Diagnosis not present

## 2020-12-28 DIAGNOSIS — F4310 Post-traumatic stress disorder, unspecified: Secondary | ICD-10-CM | POA: Diagnosis not present

## 2020-12-28 DIAGNOSIS — D117 Benign neoplasm of other major salivary glands: Secondary | ICD-10-CM | POA: Insufficient documentation

## 2020-12-28 DIAGNOSIS — Z853 Personal history of malignant neoplasm of breast: Secondary | ICD-10-CM | POA: Diagnosis not present

## 2020-12-28 DIAGNOSIS — F3181 Bipolar II disorder: Secondary | ICD-10-CM | POA: Diagnosis not present

## 2020-12-28 DIAGNOSIS — F422 Mixed obsessional thoughts and acts: Secondary | ICD-10-CM | POA: Diagnosis not present

## 2020-12-28 MED ORDER — GADOBUTROL 1 MMOL/ML IV SOLN
6.0000 mL | Freq: Once | INTRAVENOUS | Status: AC | PRN
  Administered 2020-12-28: 6 mL via INTRAVENOUS

## 2020-12-31 DIAGNOSIS — H9202 Otalgia, left ear: Secondary | ICD-10-CM | POA: Diagnosis not present

## 2020-12-31 DIAGNOSIS — R9389 Abnormal findings on diagnostic imaging of other specified body structures: Secondary | ICD-10-CM | POA: Diagnosis not present

## 2020-12-31 DIAGNOSIS — R519 Headache, unspecified: Secondary | ICD-10-CM | POA: Diagnosis not present

## 2021-01-01 DIAGNOSIS — F4310 Post-traumatic stress disorder, unspecified: Secondary | ICD-10-CM | POA: Diagnosis not present

## 2021-01-01 DIAGNOSIS — F422 Mixed obsessional thoughts and acts: Secondary | ICD-10-CM | POA: Diagnosis not present

## 2021-01-01 DIAGNOSIS — F3181 Bipolar II disorder: Secondary | ICD-10-CM | POA: Diagnosis not present

## 2021-01-02 ENCOUNTER — Encounter: Payer: Self-pay | Admitting: Hematology and Oncology

## 2021-01-02 ENCOUNTER — Ambulatory Visit
Admission: RE | Admit: 2021-01-02 | Discharge: 2021-01-02 | Disposition: A | Discharge status: Home / Self Care | Payer: Medicare Other | Source: Ambulatory Visit | Attending: Hematology and Oncology | Admitting: Hematology and Oncology

## 2021-01-02 ENCOUNTER — Encounter: Payer: Self-pay | Admitting: General Practice

## 2021-01-02 ENCOUNTER — Other Ambulatory Visit: Payer: Self-pay

## 2021-01-02 DIAGNOSIS — R9389 Abnormal findings on diagnostic imaging of other specified body structures: Secondary | ICD-10-CM

## 2021-01-02 NOTE — Progress Notes (Signed)
[place] CSW Progress Notes  Referral from [facility] and Dr Lindi Adie, patient reports inadequate community support she needs in order to deal with trauma history and current stress of cancer treatment.  Recently left current outpatient therapist, trying to find another one she feels is trustworthy.  Inpatient trauma treatment has been suggested, but is inaccessible to her due to her Medicare and Medicaid coverage.  She is on disability due to her mental health condition.  She has been paying out of pocket for therapy services due to difficulties with providers not accepting both [facility] and Medicare.    She has had one visit with an intern at [employer] for [employer], she cannot find any other providers who will take her insurances. She is frustrated w services allowed by [facility].  "I need really good therapy."    Feels that she cannot go through a biopsy unless she has adequate community support.  Went today for biopsy, began to cry, was unable to complete procedure.    She also has concerns w housing, is still waiting for housing voucher or similar, is on long waiting list.  Lives w her mother at present, wishes she could have her own place.    Feels like living with cancer is a long term stressor, fearful of results of biopsy and choices this might put in front of her.  Wants "quality of life."  Needs adequate mental health support, specifically trauma treatment including individual therapy, in order to feel comfortable proceeding with biopsy and possible additional treatment options.   CSW will attempt to expand patient's options, referred her to [employer] to investigate [place] coverage.  Will research other options.  Spoke w patient, she will call [employer] and investigate their grant program and its suitability for her circumstances.   Edwyna Shell, LCSW Clinical Social Worker Phone:  [phone]

## 2021-01-02 NOTE — Progress Notes (Signed)
[place] CSW Progress Notes  Researched options for patient.  Her Medicare is the primary payor source for referrals - this limits her options for her preferred modality Dialectical Behavioral Therapy and/or inpatient trauma treatment.  She has contacted [employer] to discuss their [place], which is available to pay charges for cancer related behavioral healthcare not paid for by patient's coverage.  She has not applied to this fund as yet.  If she can find a mental health provider accessible under her Medicare policy, she can apply to this fund for assistance.  CSW will continue to attempt to find providers she feels would be helpful to her.  Edwyna Shell, LCSW Clinical Social Worker Phone:  [phone]

## 2021-01-03 ENCOUNTER — Other Ambulatory Visit: Payer: Self-pay

## 2021-01-03 ENCOUNTER — Emergency Department (HOSPITAL_COMMUNITY)
Admission: EM | Admit: 2021-01-03 | Discharge: 2021-01-04 | Disposition: A | Discharge status: Other Acute Inpatient Hospital | Payer: Medicare Other | Source: Home / Self Care

## 2021-01-03 ENCOUNTER — Encounter: Payer: Self-pay | Admitting: Hematology and Oncology

## 2021-01-03 ENCOUNTER — Encounter (HOSPITAL_COMMUNITY): Payer: Self-pay

## 2021-01-03 ENCOUNTER — Other Ambulatory Visit: Payer: Medicare Other

## 2021-01-03 ENCOUNTER — Telehealth: Payer: Self-pay | Admitting: Hematology and Oncology

## 2021-01-03 DIAGNOSIS — R45851 Suicidal ideations: Secondary | ICD-10-CM

## 2021-01-03 DIAGNOSIS — Z20822 Contact with and (suspected) exposure to covid-19: Secondary | ICD-10-CM | POA: Insufficient documentation

## 2021-01-03 DIAGNOSIS — F314 Bipolar disorder, current episode depressed, severe, without psychotic features: Secondary | ICD-10-CM | POA: Insufficient documentation

## 2021-01-03 DIAGNOSIS — F3113 Bipolar disorder, current episode manic without psychotic features, severe: Secondary | ICD-10-CM | POA: Diagnosis not present

## 2021-01-03 DIAGNOSIS — J45909 Unspecified asthma, uncomplicated: Secondary | ICD-10-CM | POA: Insufficient documentation

## 2021-01-03 DIAGNOSIS — N9489 Other specified conditions associated with female genital organs and menstrual cycle: Secondary | ICD-10-CM | POA: Insufficient documentation

## 2021-01-03 DIAGNOSIS — F17210 Nicotine dependence, cigarettes, uncomplicated: Secondary | ICD-10-CM | POA: Insufficient documentation

## 2021-01-03 DIAGNOSIS — J449 Chronic obstructive pulmonary disease, unspecified: Secondary | ICD-10-CM | POA: Insufficient documentation

## 2021-01-03 DIAGNOSIS — Z853 Personal history of malignant neoplasm of breast: Secondary | ICD-10-CM | POA: Insufficient documentation

## 2021-01-03 DIAGNOSIS — F32A Depression, unspecified: Secondary | ICD-10-CM

## 2021-01-03 DIAGNOSIS — F419 Anxiety disorder, unspecified: Secondary | ICD-10-CM

## 2021-01-03 LAB — RAPID URINE DRUG SCREEN, HOSP PERFORMED
Amphetamines: NOT DETECTED
Barbiturates: NOT DETECTED
Benzodiazepines: NOT DETECTED
Cocaine: NOT DETECTED
Opiates: NOT DETECTED
Tetrahydrocannabinol: NOT DETECTED

## 2021-01-03 NOTE — Telephone Encounter (Signed)
Scheduled per sch msg. Called and spoke with patient. Patient is waiting to be admitted to behavioral health. Need to push the appt out. Confirmed date and time
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
## 2021-01-03 NOTE — ED Triage Notes (Signed)
Pt states she has been trying to get help with her mental health, she has problems with insurance paying for her mental health. Pt states she is SI, has PTSD. Pt states her brain will not stop thinking about her problems. Pt states she has ben eating less, leaving the house less. Pt has hx of breast cancer. Pt states she had a MRI 2 weeks ago, results were not good. Pt is anxious. Pt states that she has had thoughts of taking all her medications at this same time, to just end it.

## 2021-01-04 ENCOUNTER — Inpatient Hospital Stay (HOSPITAL_COMMUNITY)
Admission: AD | Admit: 2021-01-04 | Discharge: 2021-01-10 | DRG: 885 | Disposition: A | Discharge status: Home / Self Care | Payer: Medicare Other | Source: Intra-hospital | Attending: Emergency Medicine | Admitting: Emergency Medicine

## 2021-01-04 ENCOUNTER — Encounter (HOSPITAL_COMMUNITY): Payer: Self-pay | Admitting: Psychiatry

## 2021-01-04 DIAGNOSIS — F314 Bipolar disorder, current episode depressed, severe, without psychotic features: Secondary | ICD-10-CM | POA: Diagnosis present

## 2021-01-04 DIAGNOSIS — Z853 Personal history of malignant neoplasm of breast: Secondary | ICD-10-CM | POA: Diagnosis not present

## 2021-01-04 DIAGNOSIS — Z20822 Contact with and (suspected) exposure to covid-19: Secondary | ICD-10-CM | POA: Diagnosis present

## 2021-01-04 DIAGNOSIS — G4700 Insomnia, unspecified: Secondary | ICD-10-CM | POA: Diagnosis present

## 2021-01-04 DIAGNOSIS — F17210 Nicotine dependence, cigarettes, uncomplicated: Secondary | ICD-10-CM | POA: Diagnosis present

## 2021-01-04 DIAGNOSIS — Z79810 Long term (current) use of selective estrogen receptor modulators (SERMs): Secondary | ICD-10-CM | POA: Diagnosis not present

## 2021-01-04 DIAGNOSIS — R45851 Suicidal ideations: Secondary | ICD-10-CM | POA: Diagnosis not present

## 2021-01-04 DIAGNOSIS — F4310 Post-traumatic stress disorder, unspecified: Secondary | ICD-10-CM | POA: Diagnosis present

## 2021-01-04 DIAGNOSIS — K0889 Other specified disorders of teeth and supporting structures: Secondary | ICD-10-CM | POA: Diagnosis present

## 2021-01-04 DIAGNOSIS — G8929 Other chronic pain: Secondary | ICD-10-CM

## 2021-01-04 DIAGNOSIS — F418 Other specified anxiety disorders: Secondary | ICD-10-CM | POA: Diagnosis not present

## 2021-01-04 DIAGNOSIS — K089 Disorder of teeth and supporting structures, unspecified: Secondary | ICD-10-CM

## 2021-01-04 DIAGNOSIS — R9431 Abnormal electrocardiogram [ECG] [EKG]: Secondary | ICD-10-CM | POA: Diagnosis present

## 2021-01-04 DIAGNOSIS — F1410 Cocaine abuse, uncomplicated: Secondary | ICD-10-CM | POA: Diagnosis present

## 2021-01-04 DIAGNOSIS — F17200 Nicotine dependence, unspecified, uncomplicated: Secondary | ICD-10-CM

## 2021-01-04 DIAGNOSIS — Z880 Allergy status to penicillin: Secondary | ICD-10-CM | POA: Diagnosis not present

## 2021-01-04 DIAGNOSIS — F3164 Bipolar disorder, current episode mixed, severe, with psychotic features: Principal | ICD-10-CM | POA: Diagnosis present

## 2021-01-04 DIAGNOSIS — F332 Major depressive disorder, recurrent severe without psychotic features: Secondary | ICD-10-CM | POA: Diagnosis present

## 2021-01-04 DIAGNOSIS — D0511 Intraductal carcinoma in situ of right breast: Secondary | ICD-10-CM | POA: Diagnosis present

## 2021-01-04 DIAGNOSIS — F3160 Bipolar disorder, current episode mixed, unspecified: Secondary | ICD-10-CM | POA: Diagnosis not present

## 2021-01-04 LAB — CBC
HCT: 42.5 % (ref 36.0–46.0)
Hemoglobin: 14.2 g/dL (ref 12.0–15.0)
MCH: 32.5 pg (ref 26.0–34.0)
MCHC: 33.4 g/dL (ref 30.0–36.0)
MCV: 97.3 fL (ref 80.0–100.0)
Platelets: 204 10*3/uL (ref 150–400)
RBC: 4.37 MIL/uL (ref 3.87–5.11)
RDW: 14.6 % (ref 11.5–15.5)
WBC: 7.9 10*3/uL (ref 4.0–10.5)
nRBC: 0 % (ref 0.0–0.2)

## 2021-01-04 LAB — ETHANOL: Alcohol, Ethyl (B): <10 mg/dL (ref <10)

## 2021-01-04 LAB — COMPREHENSIVE METABOLIC PANEL
ALT: 11 U/L (ref 0–44)
AST: 12 U/L — ABNORMAL LOW (ref 15–41)
Albumin: 4 g/dL (ref 3.5–5.0)
Alkaline Phosphatase: 74 U/L (ref 38–126)
Anion gap: 6 (ref 5–15)
BUN: 15 mg/dL (ref 6–20)
CO2: 26 mmol/L (ref 22–32)
Calcium: 8.9 mg/dL (ref 8.9–10.3)
Chloride: 109 mmol/L (ref 98–111)
Creatinine, Ser: 0.72 mg/dL (ref 0.44–1.00)
GFR, Estimated: >60 mL/min (ref >60)
Glucose, Bld: 97 mg/dL (ref 70–99)
Potassium: 4 mmol/L (ref 3.5–5.1)
Sodium: 141 mmol/L (ref 135–145)
Total Bilirubin: 0.5 mg/dL (ref 0.3–1.2)
Total Protein: 6.3 g/dL — ABNORMAL LOW (ref 6.5–8.1)

## 2021-01-04 LAB — ACETAMINOPHEN LEVEL: Acetaminophen (Tylenol), Serum: <10 ug/mL — ABNORMAL LOW (ref 10–30)

## 2021-01-04 LAB — RESP PANEL BY RT-PCR (FLU A&B, COVID) ARPGX2
Influenza A by PCR: NEGATIVE
Influenza B by PCR: NEGATIVE
SARS Coronavirus 2 by RT PCR: NEGATIVE

## 2021-01-04 LAB — HCG, QUANTITATIVE, PREGNANCY: hCG, Beta Chain, Quant, S: <1 m[IU]/mL (ref <5)

## 2021-01-04 LAB — SALICYLATE LEVEL: Salicylate Lvl: <7 mg/dL — ABNORMAL LOW (ref 7.0–30.0)

## 2021-01-04 MED ORDER — TRAZODONE HCL 100 MG PO TABS
200.0000 mg | ORAL_TABLET | Freq: Every evening | ORAL | Status: DC | PRN
Start: 2021-01-04 — End: 2021-01-04

## 2021-01-04 MED ORDER — TRAZODONE HCL 50 MG PO TABS: 50.0000 mg | ORAL_TABLET | Freq: Every evening | ORAL | Status: DC | PRN

## 2021-01-04 MED ORDER — TRAZODONE HCL 100 MG PO TABS
200.0000 mg | ORAL_TABLET | Freq: Every evening | ORAL | Status: DC | PRN
  Administered 2021-01-04 – 2021-01-09 (×6): 200 mg via ORAL
  Filled 2021-01-04 (×6): qty 2

## 2021-01-04 MED ORDER — TAMOXIFEN CITRATE 10 MG PO TABS
20.0000 mg | ORAL_TABLET | Freq: Every day | ORAL | Status: DC
  Filled 2021-01-04: qty 2

## 2021-01-04 MED ORDER — TAMOXIFEN CITRATE 10 MG PO TABS: 20.0000 mg | ORAL_TABLET | Freq: Every day | ORAL | Status: DC

## 2021-01-04 MED ORDER — TAMOXIFEN CITRATE 20 MG PO TABS: 20.0000 mg | ORAL_TABLET | Freq: Every day | ORAL | Status: DC

## 2021-01-04 MED ORDER — TRAZODONE HCL 100 MG PO TABS: 100.0000 mg | ORAL_TABLET | Freq: Every evening | ORAL | Status: DC | PRN

## 2021-01-04 MED ORDER — LEVOFLOXACIN 750 MG PO TABS
750.0000 mg | ORAL_TABLET | Freq: Every day | ORAL | Status: AC
  Administered 2021-01-05 – 2021-01-08 (×4): 750 mg via ORAL
  Filled 2021-01-04 (×4): qty 1

## 2021-01-04 MED ORDER — LEVOFLOXACIN 750 MG PO TABS
750.0000 mg | ORAL_TABLET | Freq: Every day | ORAL | Status: DC
  Administered 2021-01-04: 750 mg via ORAL
  Filled 2021-01-04: qty 1

## 2021-01-04 MED ORDER — IBUPROFEN 600 MG PO TABS
600.0000 mg | ORAL_TABLET | Freq: Three times a day (TID) | ORAL | Status: DC | PRN
  Administered 2021-01-04 – 2021-01-05 (×2): 600 mg via ORAL
  Filled 2021-01-04 (×2): qty 1

## 2021-01-04 MED ORDER — TAMOXIFEN CITRATE 10 MG PO TABS
20.0000 mg | ORAL_TABLET | Freq: Every day | ORAL | Status: DC
  Administered 2021-01-04 – 2021-01-09 (×6): 20 mg via ORAL
  Filled 2021-01-04 (×9): qty 2

## 2021-01-04 MED ORDER — IBUPROFEN 800 MG PO TABS
800.0000 mg | ORAL_TABLET | Freq: Three times a day (TID) | ORAL | Status: DC
  Administered 2021-01-04: 800 mg via ORAL
  Filled 2021-01-04: qty 1

## 2021-01-04 MED ORDER — TAMOXIFEN CITRATE 10 MG PO TABS: 20.0000 mg | ORAL_TABLET | Freq: Every evening | ORAL | Status: DC

## 2021-01-04 MED ORDER — LEVOFLOXACIN 750 MG PO TABS: 750.0000 mg | ORAL_TABLET | Freq: Every day | ORAL | Status: DC

## 2021-01-04 MED ORDER — MAGNESIUM HYDROXIDE 400 MG/5ML PO SUSP: 30.0000 mL | Freq: Every day | ORAL | Status: DC | PRN

## 2021-01-04 MED ORDER — ALUM & MAG HYDROXIDE-SIMETH 200-200-20 MG/5ML PO SUSP
30.0000 mL | ORAL | Status: DC | PRN
  Administered 2021-01-09 – 2021-01-10 (×3): 30 mL via ORAL
  Filled 2021-01-04 (×3): qty 30
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
## 2021-01-04 NOTE — Psych (Signed)
Virtual Visit via Video Note  I connected with NATALIAH HATLESTAD on 11/22/20 at  9:00 AM EDT by a video enabled telemedicine application and verified that I am speaking with the correct person using two identifiers.  Location: Patient: patient home Provider: clinical home office   I discussed the limitations of evaluation and management by telemedicine and the availability of in person appointments. The patient expressed understanding and agreed to proceed.  I discussed the assessment and treatment plan with the patient. The patient was provided an opportunity to ask questions and all were answered. The patient agreed with the plan and demonstrated an understanding of the instructions.   The patient was advised to call back or seek an in-person evaluation if the symptoms worsen or if the condition fails to improve as anticipated.  Pt was provided 240 minutes of non-face-to-face time during this encounter.   Lorin Glass, LCSW    [facility] BH PHP THERAPIST PROGRESS NOTE  KELLAN BOEHLKE [id number]  Session Time: 9:00 - 10:00  Participation Level: Active  Behavioral Response: CasualAlertAnxious and Depressed  Type of Therapy: Group Therapy  Treatment Goals addressed: Coping  Interventions: CBT, DBT, Supportive, and Reframing  Summary: Clinician led check-in regarding current stressors and situation. Clinician utilized active listening and empathetic response and validated patient emotions. Clinician facilitated processing group on pertinent issues.   Therapist Response: ZYNASIA BURKLOW is a 50 y.o. female who presents with trauma and depression symptoms. Patient arrived within time allowed and reports that she is feeling "trapped." Patient rates her mood at a 2 on a scale of 1-10 with 10 being great. Pt reports she feels more withdrawn and thinks "the world is a threat." Pt states feeling she "can't get any support" and that she is "at the mercy of external options." Pt reports hopelessness and  passive SI and denies plan or intent. Pt able to process. Pt engaged in discussion.         Session Time: 10:00 - 11:00   Participation Level: Active   Behavioral Response: CasualAlertDepressed   Type of Therapy: Group Therapy   Treatment Goals addressed: Coping   Interventions: CBT, DBT, Supportive and Reframing   Summary: Cln led discussion on healthy relationships. Group members shared issues they have experienced in past relationships and in identifying what "healthy" looks like. Cln discussed respect, trust, and honesty as non-negotiable traits in a healthy dynamic. Group shared and problem solved barriers to recognizing and priortizing these traits.      Therapist Response: Pt engaged in discussion and is able to process.           Session Time: 11:00- 12:00   Participation Level: Active   Behavioral Response: CasualAlertDepressed   Type of Therapy: Group Therapy   Treatment Goals addressed: Coping   Interventions: CBT, DBT, Supportive and Reframing   Summary: Cln introduced topic of CBT cognitive distortions. Cln discussed unhealthy thought patterns and how our thoughts shape our reality and irrational thoughts can alter our perspective. Cln utilized handout "cognitive distortions" to discuss common examples of distorted thoughts and group members worked to identify examples in their own life.      Therapist Response: Pt engaged in discussion and is able to determine examples of distorted thinking in their own life.         Session Time: 12:00 -1:00   Participation Level: Active   Behavioral Response: CasualAlertDepressed   Type of Therapy: Group therapy   Treatment Goals addressed: Coping   Interventions: CBT;  Solution focused; Supportive; Reframing   Summary: 12:00 - 12:50: Cln continued topic of CBT cognitive distortions and utilized handout "Unhealthy Thought Patterns"to review common examples of distorted thought to increase awareness of the  distorted thoughts. 12:50 -1:00 Clinician led check-out. Clinician assessed for immediate needs, medication compliance and efficacy, and safety concerns   Therapist Response: 12:50 - 1:00: Pt engaged in discussion and is able to make connections.  12:50 - 1:00: At check-out, patient rates her mood at a 3 on a scale of 1-10 with 10 being great. Pt reports afternoon plans of listening to a podcast. Pt demonstrates some progress as evidenced by continued efforts to manage SI. Patient reports passive SI and denies plan/intent at the end of group.   Suicidal/Homicidal: Yeswithout intent/plan  Plan: Pt will continue in PHP while working to decrease trauma and depression symptoms, decrease SI, and increase ability to manage symptoms in a healthy manner.   Diagnosis: PTSD (post-traumatic stress disorder) [F43.10]    1. PTSD (post-traumatic stress disorder)   2. 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[place])       Lorin Glass, LCSW 01/04/2021

## 2021-01-04 NOTE — Psych (Signed)
Virtual Visit via Video Note  I connected with Catherine Olsen on 11/23/20 at  9:00 AM EDT by a video enabled telemedicine application and verified that I am speaking with the correct person using two identifiers.  Location: Patient: patient home Provider: clinical home office   I discussed the limitations of evaluation and management by telemedicine and the availability of in person appointments. The patient expressed understanding and agreed to proceed.  I discussed the assessment and treatment plan with the patient. The patient was provided an opportunity to ask questions and all were answered. The patient agreed with the plan and demonstrated an understanding of the instructions.   The patient was advised to call back or seek an in-person evaluation if the symptoms worsen or if the condition fails to improve as anticipated.  Pt was provided 240 minutes of non-face-to-face time during this encounter.   Catherine Glass, LCSW    [facility] PHP THERAPIST PROGRESS NOTE  Catherine Olsen [id number]  Session Time: 9:00 - 10:00  Participation Level: Active  Behavioral Response: CasualAlertAnxious and Depressed  Type of Therapy: Group Therapy  Treatment Goals addressed: Coping  Interventions: CBT, DBT, Supportive, and Reframing  Summary: Clinician led check-in regarding current stressors and situation. Clinician utilized active listening and empathetic response and validated patient emotions. Clinician facilitated processing group on pertinent issues.   Therapist Response: Catherine Olsen is a 50 y.o. female who presents with trauma and depression symptoms. Patient arrived within time allowed and reports that she is feeling "angry." Patient rates her mood at a 2 on a scale of 1-10 with 10 being great. Pt reports she spent yesterday following up on resources for follow-up care. Pt reports she is waiting for some calls back but also received some nos for insurance or availability reasons. Pt reports  being "enraged" that there are not services she want covered by her insurance. Pt is escalated and unwilling to utilize skills in the moment. Pt able to process. Pt engaged in discussion.         Session Time: 10:00 - 11:00   Participation Level: Active   Behavioral Response: CasualAlertDepressed   Type of Therapy: Group Therapy   Treatment Goals addressed: Coping   Interventions: CBT, DBT, Supportive and Reframing   Summary: Cln continued discussion on topic of boundaries. Group reviewed previous aspects of boundaries discussed. Cln utilized handout "Tips for [employer]" and group members discussed how to apply the tips. Cln shaped conversation and cued for healthy boundary characteristics.      Therapist Response: Pt engaged in discussion and is able to process ways to increase their healthy boundaries.             Session Time: 11:00- 12:00   Participation Level: Active   Behavioral Response: CasualAlertDepressed   Type of Therapy: Group Therapy   Treatment Goals addressed: Coping   Interventions: CBT, DBT, Supportive and Reframing   Summary: Cln continued topic of CBT cognitive distortions and introduced thought challenging as a way to  utilize the "challenge" C in C-C-C. Group utilized Web designer questions" as a way to introduce challenges and reframe distorted thinking. Group members worked through pt examples to practice challenging distorted thinking.      Therapist Response: Pt engaged in discussion and demonstrates understanding of challenging distorted thoughts through practice.          Session Time: 12:00 -1:00   Participation Level: Active   Behavioral Response: CasualAlertDepressed   Type of Therapy: Group therapy  Treatment Goals addressed: Coping   Interventions: CBT; Solution focused; Supportive; Reframing   Summary: 12:00 - 12:50: Cln introduced topic of positive psychology. Group discussed the 5 ways to train your brain to  scan for the positive: conscious acts of kindness, meditation, exercise, postive event journaling, and gratitudes. Group members discussed how to apply the principles in their every day life.  12:50 -1:00 Clinician led check-out. Clinician assessed for immediate needs, medication compliance and efficacy, and safety concerns   Therapist Response: 12:50 - 1:00: Pt engaged in discussion and reports she can start by utilizing gratitudes.  12:50 - 1:00: At check-out, patient rates her mood at a 3.5 on a scale of 1-10 with 10 being great. Pt reports afternoon plans of spending time alone. Pt demonstrates some progress as evidenced by accessing ration mind once de-escalated. Patient reports passive SI and denies plan/intent at the end of group. Pt is scheduled for discharge today, however requests an additional day due to frustrations and wanting to check-on after the weekend. Cln agreed.   Cln provided pt with website to file grievance with [facility] and a [place] website for resources on affordable housing. Cln also informed pt that the ACTT available are not accepting new patients.    Suicidal/Homicidal: Yeswithout intent/plan  Plan: Pt will continue in PHP while working to decrease trauma and depression symptoms, decrease SI, and increase ability to manage symptoms in a healthy manner.   Diagnosis: PTSD (post-traumatic stress disorder) [F43.10]    1. PTSD (post-traumatic stress disorder)   2. 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

## 2021-01-04 NOTE — ED Notes (Signed)
Per Secretary, [employer] has arrived.  Lilia Pro NT walked out Pt and all of her belongings.  Additionally, consents provided to transport.

## 2021-01-04 NOTE — ED Notes (Signed)
Pt noted to be resting comfortably.  

## 2021-01-04 NOTE — ED Notes (Signed)
Pt belongings placed in belonging bags and labeled. Bags placed in 19-22 Nurse's station cabinet.

## 2021-01-04 NOTE — ED Notes (Signed)
[employer]ist information centre manager for transport.

## 2021-01-04 NOTE — Group Note (Signed)
LCSW Group Therapy Note  Group Date: 01/04/2021 Start Time: 1300 End Time: 1400   Type of Therapy and Topic:  Group Therapy - Healthy vs Unhealthy Coping Skills  Participation Level:  Did Not Attend   Description of Group The focus of this group was to determine what unhealthy coping techniques typically are used by group members and what healthy coping techniques would be helpful in coping with various problems. Patients were guided in becoming aware of the differences between healthy and unhealthy coping techniques. Patients were asked to identify 2-3 healthy coping skills they would like to learn to use more effectively.  Therapeutic Goals Patients learned that coping is what human beings do all day long to deal with various situations in their lives Patients defined and discussed healthy vs unhealthy coping techniques Patients identified their preferred coping techniques and identified whether these were healthy or unhealthy Patients determined 2-3 healthy coping skills they would like to become more familiar with and use more often. Patients provided support and ideas to each other   Summary of Patient Progress:  Did not attend    Therapeutic Modalities Cognitive [place], [place] 01/04/2021  3:07 PM
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
## 2021-01-04 NOTE — ED Provider Notes (Addendum)
[facility] DEPT Provider Note   CSN: [id number] Arrival date & time: 01/03/21  2203     History Chief Complaint  Patient presents with   Suicidal   Anxiety    Catherine Olsen is a 50 y.o. female here for evaluation of anxiety, SI.  States she has PTSD.  Patient states she feels likely is no longer worth living due to her chronic medical problems.  She denies any plan.  Not sleeping at night, eating less, leaving the house less.  Apparently told triage she was when she took all her medications at the same time and attempt to end her life.  Patient denies HI, AVH.  She will not expound on new information and states "I want to talk to psychiatry.  "She denies any current pain, fever, emesis, chest pain, shortness of breath, abdominal pain.  Denies any illicit substances.  Denies additional aggravating or relieving factors.  History obtained from patient and past medical records.  No interpreter used.  HPI     Past Medical History:  Diagnosis Date   Abnormal uterine bleeding (AUB) 06/25/2009   Qualifier: Diagnosis of  By: Cathren Laine MD, Ankit     Allergy    Angio-edema    Anxiety    Arthritis    hands, lower back, knees   Asthma    Bipolar 1 disorder ([place])    Breast cancer ([place])    stage 0   Chronic prescription benzodiazepine use 09/08/2018   COPD (chronic obstructive pulmonary disease) (H[facility])    Depression    Endometriosis    Fibromyalgia    GERD (gastroesophageal reflux disease)    Headache(784.0)    otc med prn   Migraine    Pituitary tumor    microadenoma   PONV (postoperative nausea and vomiting)    PTSD (post-traumatic stress disorder)    Scoliosis    Termination of pregnancy    x 2 at age 80 and 50 yrs old   Tobacco abuse 03/04/2015   Urticaria     Patient Active Problem List   Diagnosis Date Noted   Bipolar 1 disorder, depressed, severe ([place]) 08/12/2020   Breast cancer ([place])    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    History of suicidal ideation 09/08/2018   Other allergic rhinitis 02/02/2018   Allergy, unspecified, subsequent encounter 02/02/2018   Mild intermittent asthma without complication 17/51/0258   Angio-edema 02/02/2018   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   Gastroesophageal reflux disease without esophagitis 02/02/2018   Chronic dental pain 01/20/2018   Chronic facial pain 01/20/2018   Prolonged Q-T interval on ECG 01/02/2018   Environmental allergies 10/29/2017   Pain in joint of left shoulder 07/15/2017   Neck pain 07/15/2017   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   Chronic migraine without aura without status migrainosus, not intractable 03/24/2017   Chronic midline low back pain without sciatica 03/24/2017   Pulmonary emphysema ([place]) 02/19/2017   Jaw pain 02/11/2017   Smoking history 12/09/2016   Exertional dyspnea 12/09/2016   Family history of alpha 1 antitrypsin deficiency 09/04/2016   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   Nodule of left lung 09/04/2016   Cervical lymphadenopathy 01/29/2016   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[place])    Perimenopausal vasomotor symptoms 05/25/2015   Chronic rhinitis 04/18/2015   Pituitary microadenoma ([place]) 05/19/2014   Endometriosis of ovary 04/03/2014   Ovarian cyst, right 04/03/2014   Posttraumatic stress disorder 09/13/2012   Fibrocystic breast disease 08/24/2012   Myalgia and  myositis 07/26/2012   Depression 07/26/2012   PTSD (post-traumatic stress disorder) 01/09/2009   Insomnia 01/17/2008   NEVI, MULTIPLE 07/17/2006   KERATOSIS, SEBORRHEIC NEC 07/17/2006   ACNE NEC 07/17/2006    Past Surgical History:  Procedure Laterality Date   BREAST BIOPSY Right 05/19/2007   BREAST BIOPSY Right 06/02/2007   BREAST BIOPSY  06/29/2019   BREAST EXCISIONAL BIOPSY     BREAST LUMPECTOMY Right 07/21/2019   BREAST LUMPECTOMY WITH RADIOACTIVE SEED LOCALIZATION Right 07/21/2019   Procedure: RIGHT BREAST LUMPECTOMY WITH RADIOACTIVE SEED LOCALIZATION;   Surgeon: Alphonsa Overall, MD;  Location: [facility];  Service: General;  Laterality: Right;   BREAST SURGERY     DILATION AND CURETTAGE OF UTERUS     FACIAL COSMETIC SURGERY     right cheek   LAPAROSCOPY Right 11/11/2013   Procedure: LAPAROSCOPY OPERATIVE with  Drainage of  RIGHT Ovarian ENDOMETRIOMA;  Surgeon: Elveria Royals, MD;  Location: [facility];  Service: Gynecology;  Laterality: Right;   NASAL ENDOSCOPY     said it showed some acid reflux from ENT   NOSE SURGERY     rhinoplasty at age 73 yrs   WISDOM TOOTH EXTRACTION       OB History     Gravida  2   Para      Term      Preterm      AB  2   Living  0      SAB      IAB  2   Ectopic      Multiple      Live Births              Family History  Problem Relation Age of Onset   Bipolar disorder Mother    Diabetes Mother    Hypertension Mother    Stroke Mother 95       CVA   Mental illness Mother        no diagnosis; personality disorder   Bipolar disorder Father    Hyperlipidemia Father    Hypertension Father    COPD Father    Alpha-1 antitrypsin deficiency Father    Asthma Father    Alpha-1 antitrypsin deficiency Brother    Asthma Brother    Heart disease Maternal Grandfather    Hyperlipidemia Maternal Grandfather    Hypertension Maternal Grandfather    Asthma Maternal Grandfather    Bipolar disorder Maternal Grandmother    Depression Maternal Grandmother    Diabetes Maternal Grandmother    Heart disease Maternal Grandmother    Hyperlipidemia Maternal Grandmother    Hypertension Maternal Grandmother    Mental illness Maternal Grandmother    Heart disease Paternal Grandfather    Hyperlipidemia Paternal Grandfather    Hypertension Paternal Grandfather    Stroke Paternal Grandfather    Alzheimer's disease Paternal Grandmother    Allergic rhinitis Neg Hx    Angioedema Neg Hx    Eczema Neg Hx    Urticaria Neg Hx    Immunodeficiency Neg Hx    Colon cancer Neg Hx    Esophageal  cancer Neg Hx     Social History   Tobacco Use   Smoking status: Every Day    Packs/day: 0.50    Years: 29.00    Pack years: 14.50    Types: Cigarettes    Last attempt to quit: 10/08/2019    Years since quitting: 1.2   Smokeless tobacco: Never  Vaping Use   Vaping Use:  Former  Substance Use Topics   Alcohol use: Yes    Alcohol/week: 2.0 standard drinks    Types: 2 Cans of beer per week    Comment: 6 nights   Drug use: Not Currently    Types: "Crack" cocaine    Comment: 07/19/2019    Home Medications Prior to Admission medications   Medication Sig Start Date End Date Taking? Authorizing Provider  ibuprofen (ADVIL) 800 MG tablet Take 800 mg by mouth every 8 (eight) hours as needed for mild pain.   Yes [provider]  levofloxacin (LEVAQUIN) 750 MG tablet Take 750 mg by mouth daily. 12/24/20  Yes [provider]  tamoxifen (NOLVADEX) 20 MG tablet Take 1 tablet (20 mg total) by mouth daily. For breast cancer Patient taking differently: Take 20 mg by mouth every evening. For breast cancer 11/07/20  Yes Nicholas Lose, MD  EPINEPHrine 0.3 mg/0.3 mL IJ SOAJ injection Inject 0.3 mg into the skin as needed for anaphylaxis. 05/10/20   Elsie Stain, MD  traZODone (DESYREL) 100 MG tablet Take 1 tablet (100 mg total) by mouth at bedtime as needed and may repeat dose one time if needed for sleep. Patient taking differently: Take 200 mg by mouth at bedtime as needed and may repeat dose one time if needed for sleep. 11/26/20   Derrill Center, NP  VRAYLAR 1.5 MG capsule Take 1.5 mg by mouth daily. 12/05/20   [provider]    Allergies    Other, Prednisone, Sulfamethoxazole, Oxycodone, Abilify [aripiprazole], Celebrex [celecoxib], Molds & smuts, Penicillins, Tylenol [acetaminophen], Lamictal [lamotrigine], Percocet [oxycodone-acetaminophen], Sulfa antibiotics, and Sulfites  Review of Systems   Review of Systems  Constitutional: Negative.   HENT: Negative.     Respiratory: Negative.    Cardiovascular: Negative.   Gastrointestinal: Negative.   Genitourinary: Negative.   Musculoskeletal: Negative.   Skin: Negative.   Neurological: Negative.   Psychiatric/Behavioral:  Positive for sleep disturbance and suicidal ideas. Negative for hallucinations. The patient is nervous/anxious.   All other systems reviewed and are negative.  Physical Exam Updated Vital Signs BP 126/85 (BP Location: Left Arm)   Pulse 72   Temp 97.7 F (36.5 C) (Oral)   Resp 15   Ht 5\' 7"  (1.702 m)   Wt 61.2 kg   LMP 11/21/2014   SpO2 97%   BMI 21.14 kg/m   Physical Exam Vitals and nursing note reviewed.  Constitutional:      General: She is not in acute distress.    Appearance: She is well-developed. She is not ill-appearing, toxic-appearing or diaphoretic.  HENT:     Head: Atraumatic.  Eyes:     Pupils: Pupils are equal, round, and reactive to light.  Cardiovascular:     Rate and Rhythm: Normal rate.  Pulmonary:     Effort: No respiratory distress.  Abdominal:     General: There is no distension.  Musculoskeletal:        General: Normal range of motion.     Cervical back: Normal range of motion.  Skin:    General: Skin is warm and dry.  Neurological:     General: No focal deficit present.     Mental Status: She is alert.  Psychiatric:        Attention and Perception: Attention normal.        Mood and Affect: Mood normal. Affect is flat.        Speech: Speech normal.        Behavior:  Behavior is withdrawn.        Thought Content: Thought content is not paranoid or delusional. Thought content includes suicidal ideation. Thought content does not include homicidal ideation. Thought content does not include homicidal plan.     Comments: Flat affect, admits to SI.  Denies HI, AVH.    ED Results / Procedures / Treatments   Labs (all labs ordered are listed, but only abnormal results are displayed) Labs Reviewed  COMPREHENSIVE METABOLIC PANEL - Abnormal;  Notable for the following components:      Result Value   Total Protein 6.3 (*)    AST 12 (*)    All other components within normal limits  SALICYLATE LEVEL - Abnormal; Notable for the following components:   Salicylate Lvl <7.4 (*)    All other components within normal limits  ACETAMINOPHEN LEVEL - Abnormal; Notable for the following components:   Acetaminophen (Tylenol), Serum <10 (*)    All other components within normal limits  RESP PANEL BY RT-PCR (FLU A&B, COVID) ARPGX2  ETHANOL  CBC  RAPID URINE DRUG SCREEN, HOSP PERFORMED  HCG, QUANTITATIVE, PREGNANCY    EKG None  Radiology No results found.  Procedures Procedures   Medications Ordered in ED Medications  levofloxacin (LEVAQUIN) tablet 750 mg (has no administration in time range)  tamoxifen (NOLVADEX) tablet 20 mg (has no administration in time range)  traZODone (DESYREL) tablet 100 mg (has no administration in time range)    ED Course  I have reviewed the triage vital signs and the nursing notes.  Pertinent labs & imaging results that were available during my care of the patient were reviewed by me and considered in my medical decision making (see chart for details).  For evaluation of SI with plan to take all of her medications.  States life is no longer worth living due to her chronic medical problems.  She is afebrile, nonseptic, not ill-appearing.  Denies illicit substance use.  Work-up personally reviewed and interpreted:  No significant abnormality  Patient medically cleared  Psych hold orders placed, home meds ordered  Disposition per TTS  Addend: Per Psych, patient meets inpatient criteria    MDM Rules/Calculators/A&P                            Final Clinical Impression(s) / ED Diagnoses Final diagnoses:  Depression, unspecified depression type  Suicidal ideation  Anxiety    Rx / DC Orders ED Discharge Orders     None        Tahani Potier A, PA-C 01/04/21 0201     Dartanyon Frankowski A, PA-C 01/04/21 0450    Sherwood Gambler, MD 01/04/21 [phone]

## 2021-01-04 NOTE — Consult Note (Signed)
  Catherine Olsen 50 year old Caucasian female presents due to reported suicidal ideations.  Charted history with bipolar disorder, insomnia and PTSD.  She continues to endorse suicidal ideations with a plan to overdose on medications due to multiple psychosocial stressors and medical diagnoses.  Reports she is taken chemotherapy medications for breast cancer.  States she does not feel supported by her family.  States she understands that she needs a trauma based therapy however states her insurance does not cover the services. "  I feel left behind because I don't have money."  Catherine Olsen meet inpatient criteria.  Patient is under review at [facility] behavioral health [facility])

## 2021-01-04 NOTE — Psych (Signed)
Virtual Visit via Video Note  I connected with Catherine Olsen on 11/26/20 at  9:00 AM EDT by a video enabled telemedicine application and verified that I am speaking with the correct person using two identifiers.  Location: Patient: patient home Provider: clinical home office   I discussed the limitations of evaluation and management by telemedicine and the availability of in person appointments. The patient expressed understanding and agreed to proceed.  I discussed the assessment and treatment plan with the patient. The patient was provided an opportunity to ask questions and all were answered. The patient agreed with the plan and demonstrated an understanding of the instructions.   The patient was advised to call back or seek an in-person evaluation if the symptoms worsen or if the condition fails to improve as anticipated.  Pt was provided 240 minutes of non-face-to-face time during this encounter.   Lorin Glass, LCSW    [facility] BH PHP THERAPIST PROGRESS NOTE  Catherine Olsen [id number]  Session Time: 9:00 - 10:00  Participation Level: Active  Behavioral Response: CasualAlertAnxious and Depressed  Type of Therapy: Group Therapy  Treatment Goals addressed: Coping  Interventions: CBT, DBT, Supportive, and Reframing  Summary: Clinician led check-in regarding current stressors and situation. Clinician utilized active listening and empathetic response and validated patient emotions. Clinician facilitated processing group on pertinent issues.   Therapist Response: Catherine Olsen is a 50 y.o. female who presents with trauma and depression symptoms. Patient arrived within time allowed and reports that she is feeling "pissed." Patient rates her mood at a 2 on a scale of 1-10 with 10 being great. Pt reports she sought crisis services over the weekend going to [facility], [place], and called mobile crisis. Pt reports she felt she did not get the help she wanted. Pt able to process. Pt engaged in  discussion.         Session Time: 10:00 - 11:00   Participation Level: Active   Behavioral Response: CasualAlertDepressed   Type of Therapy: Group Therapy   Treatment Goals addressed: Coping   Interventions: CBT, DBT, Supportive and Reframing   Summary: Cln led processing group for pt's current struggles. Group members shared stressors and provided support and feedback. Cln brought in topics of boundaries, healthy relationships, and unhealthy thought processes to inform discussion.     Therapist Response: Pt able to process and provide support to group.            Session Time: 11:00- 12:00   Participation Level: Active   Behavioral Response: CasualAlertDepressed   Type of Therapy: Group Therapy   Treatment Goals addressed: Coping   Interventions: CBT, DBT, Supportive and Reframing   Summary: Cln led discussion on impulsivity. Group discussed struggles with impulsivity. Cln highlighted theme of immediacy. Group built insight around the way immediacy interacts with impulsivity. Cln encouraged pt's to consider mantras and grounding statements to remind themselves there is time to think/feel/act.      Therapist Response: Pt engaged in discussion and is able to make connections and gain insight.          Session Time: 12:00 -1:00   Participation Level: Active   Behavioral Response: CasualAlertDepressed   Type of Therapy: Group therapy   Treatment Goals addressed: Coping   Interventions: CBT; Solution focused; Supportive; Reframing   Summary: 12:00 - 12:50: Cln continued topic of boundaries and led a "boundary workshop" in which group members brought current boundary issues and group worked together to apply boundary concepts to help address  the concern. Cln helped shape conversation to maintain fidelity.  12:50 -1:00 Clinician led check-out. Clinician assessed for immediate needs, medication compliance and efficacy, and safety concerns   Therapist Response:  12:50 - 1:00:  Pt engaged in discussion and shared a current boundary issue and reports gaining insight.  12:50 - 1:00: At check-out, patient rates her mood at a 2 on a scale of 1-10 with 10 being great. Pt reports afternoon plans of spending time alone. Pt demonstrates some progress as evidenced by accessing ration mind once de-escalated. Patient reports passive SI and denies plan/intent at the end of group.   Suicidal/Homicidal: Yeswithout intent/plan  Plan: Pt will discharge from PHP due to being served better by more specialized trauma services. Pt is scheduled with Dr Altamese [place] for psychiatry and will begin seeing her therapist, Windle Guard twice a week for trauma therapy beginning 8/27. Pt and provider are aligned with discharge. Pt reports passive SI and denies plan and intent at discharge.   Diagnosis: PTSD (post-traumatic stress disorder) [F43.10]    1. PTSD (post-traumatic stress disorder)   2. Insomnia, unspecified type   3. 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

## 2021-01-04 NOTE — ED Notes (Signed)
Pt moved to RM 17  briefly for tele-assessment.

## 2021-01-04 NOTE — ED Notes (Signed)
Report given to [place] at [facility].

## 2021-01-04 NOTE — Progress Notes (Signed)
Admission Note:  D: 50  yr old female voluntarily admitted to [facility] via [place] who presents in  acute distress for the treatment of  acute on chronic stress, PTSD and medication management. Pt presented disorganized with thought processes. Pt appeared to be currently in mania with labile affect. Pt's speech is rapid and difficult to follow at times. Pt denies HI/ AVH and pain, but endorses active suicide ideation and thoughts yet verbally contracts for safety to this Probation officer. Pt' current stressor is that she had breast cancer and her medications are interfering with psychiatric medications. She is hyper-focused on a specific medication regime that she is adamant about making sure providers at this facility follows. Pt was goal oriented stated that she want to get helped with her medications, PTSD and referral for ongoing treatment beyond [facility] for her mental health.                                    A: Pt admitted to unit per protocol, skin assessment and search done and no contraband found.  No significant findings on skin. Pt  educated on therapeutic milieu rules. Pt was introduced to milieu by nursing staff.  Unit policies were explained and pt verbalized understanding. All necessary consents obtained. Food and fluids offered, and  accepted.   R: Pt was receptive to education about the milieu .  15 min safety checks started. Support was offered by this Probation officer.      01/04/21 1800  Psych Admission Type (Psych Patients Only)  Admission Status Voluntary  Psychosocial Assessment  Patient Complaints Anxiety;Appetite decrease;Self-harm thoughts;Crying spells  Eye Contact Staring  Facial Expression Anxious;Angry  Affect Labile;Preoccupied;Depressed;Apprehensive  Speech Rapid;Argumentative  Interaction Sarcastic  Motor Activity Hyperactive;Unsteady  Appearance/Hygiene Disheveled;In scrubs  Behavior Characteristics Agressive verbally;Anxious;Impulsive  Mood Labile;Apprehensive  Thought Process  Coherency  Disorganized;Flight of ideas;Circumstantial  Content Blaming others;Delusions;Hypochondria;Obsessions;Preoccupation;Paranoia  Delusions Paranoid  Perception Hallucinations  Hallucination Auditory;Visual  Judgment Poor  Confusion None  Danger to Self  Current suicidal ideation? Active  Danger to Others  Danger to Others None reported or observed

## 2021-01-04 NOTE — ED Notes (Addendum)
Pt continues to voice SI w/ plan to OD on trazodone.  Sts "I have enough Trazodone to do it."  Denies HI and AVH.  Pt is concerned about not having home meds and vegan diet.  EDP made aware.    Pt updated on plan of care.  Pt is upset about being in the hallway and SI precautions.  Sitter at bedside.   Pt reports she sees Dr. Altamese [place] for her psych needs.  Sts she has no support and lots of stressors, including her health (Hx of breast CA).

## 2021-01-04 NOTE — BH Assessment (Addendum)
[place] Assessment Progress Note   Per Ricky Ala, NP, this pt requires psychiatric hospitalization at this time.  Linsey, RN, [facility] has pre-assigned pt to [facility] Rm 405-1 to the service of Dr Nelda Marseille in anticipation of a discharge planned for later today.  Pt has signed Voluntary Admission and Consent for Treatment, as well as Consent to Release Information to Leanord Hawking, MD, her PCP, her oncologist and family members.  A notification call has been place to Dr Sanjuana Letters.  Signed forms have been faxed to [facility].  EDP Lacretia Leigh, MD and pt's nurse, Altha Harm, have been notified, and Alaina agrees to send original paperwork along with pt via [employer], and to call report to [phone] when the time comes.  Jalene Mullet, [place] [facility] Coordinator [phone]   Addendum:  At 13:59 Linsey informed this writer that bed is now ready for pt.  Dr Zenia Resides has been notified.  Jalene Mullet, [place] Coordinator [phone]

## 2021-01-05 DIAGNOSIS — F3160 Bipolar disorder, current episode mixed, unspecified: Secondary | ICD-10-CM | POA: Diagnosis not present

## 2021-01-05 DIAGNOSIS — R45851 Suicidal ideations: Secondary | ICD-10-CM

## 2021-01-05 DIAGNOSIS — F3164 Bipolar disorder, current episode mixed, severe, with psychotic features: Secondary | ICD-10-CM | POA: Diagnosis present

## 2021-01-05 HISTORY — DX: Suicidal ideations: R45.851

## 2021-01-05 LAB — LIPID PANEL
Cholesterol: 155 mg/dL (ref 0–200)
HDL: 62 mg/dL (ref >40)
LDL Cholesterol: 76 mg/dL (ref 0–99)
Total CHOL/HDL Ratio: 2.5 RATIO
Triglycerides: 84 mg/dL (ref <150)
VLDL: 17 mg/dL (ref 0–40)

## 2021-01-05 LAB — HEMOGLOBIN A1C
Hgb A1c MFr Bld: 5.2 % (ref 4.8–5.6)
Mean Plasma Glucose: 102.54 mg/dL

## 2021-01-05 LAB — TSH: TSH: 2.224 u[IU]/mL (ref 0.350–4.500)

## 2021-01-05 MED ORDER — CALCIUM CARBONATE ANTACID 500 MG PO CHEW
1.0000 | CHEWABLE_TABLET | Freq: Every day | ORAL | Status: DC
  Filled 2021-01-05 (×3): qty 1

## 2021-01-05 MED ORDER — CALCIUM CITRATE 950 (200 CA) MG PO TABS
200.0000 mg | ORAL_TABLET | Freq: Every day | ORAL | Status: DC
  Administered 2021-01-05 – 2021-01-09 (×4): 200 mg via ORAL
  Filled 2021-01-05 (×8): qty 1

## 2021-01-05 MED ORDER — IBUPROFEN 800 MG PO TABS
800.0000 mg | ORAL_TABLET | Freq: Three times a day (TID) | ORAL | Status: DC | PRN
  Administered 2021-01-05 – 2021-01-10 (×13): 800 mg via ORAL
  Filled 2021-01-05 (×13): qty 1

## 2021-01-05 MED ORDER — CALCIUM CITRATE 950 (200 CA) MG PO TABS
200.0000 mg | ORAL_TABLET | Freq: Every day | ORAL | Status: DC
  Filled 2021-01-05 (×2): qty 1

## 2021-01-05 MED ORDER — NICOTINE 21 MG/24HR TD PT24
21.0000 mg | MEDICATED_PATCH | Freq: Every day | TRANSDERMAL | Status: DC
  Filled 2021-01-05 (×8): qty 1

## 2021-01-05 MED ORDER — ADULT MULTIVITAMIN W/MINERALS CH
1.0000 | ORAL_TABLET | Freq: Every day | ORAL | Status: DC
  Administered 2021-01-05 – 2021-01-09 (×4): 1 via ORAL
  Filled 2021-01-05 (×10): qty 1

## 2021-01-05 MED ORDER — CARIPRAZINE HCL 1.5 MG PO CAPS
1.5000 mg | ORAL_CAPSULE | Freq: Every day | ORAL | Status: DC
  Administered 2021-01-05 – 2021-01-07 (×3): 1.5 mg via ORAL
  Filled 2021-01-05 (×4): qty 1

## 2021-01-05 MED ORDER — PANTOPRAZOLE SODIUM 40 MG PO TBEC
40.0000 mg | DELAYED_RELEASE_TABLET | Freq: Every day | ORAL | Status: DC
  Filled 2021-01-05 (×8): qty 1

## 2021-01-05 NOTE — Group Note (Deleted)
LCSW Group Therapy Note  Group Date: 01/05/2021 Start Time: 3212 End Time: 1115   Type of Therapy and Topic:  Group Therapy: Anger Cues and Responses  Participation Level:  {BHH PARTICIPATION [id number]}   Description of Group:   In this group, patients learned how to recognize the physical, cognitive, emotional, and behavioral responses they have to anger-provoking situations.  They identified a recent time they became angry and how they reacted.  They analyzed how their reaction was possibly beneficial and how it was possibly unhelpful.  The group discussed a variety of healthier coping skills that could help with such a situation in the future.  Focus was placed on how helpful it is to recognize the underlying emotions to our anger, because working on those can lead to a more permanent solution as well as our ability to focus on the important rather than the urgent.  Therapeutic Goals: 1. Patients will remember their last incident of anger and how they felt emotionally and physically, what their thoughts were at the time, and how they behaved. 2. Patients will identify how their behavior at that time worked for them, as well as how it worked against them. 3. Patients will explore possible new behaviors to use in future anger situations. 4. Patients will learn that anger itself is normal and cannot be eliminated, and that healthier reactions can assist with resolving conflict rather than worsening situations.  Summary of Patient Progress:  *** was active during the group. ***he shared a recent occurrence wherein feeling *** led to anger. ***he demonstrated *** insight into the subject matter, was respectful of peers, and participated throughout the entire session.  Therapeutic Modalities:   Cognitive Behavioral Therapy    Marquette Old 01/05/2021  12:13 PM

## 2021-01-05 NOTE — Progress Notes (Signed)
   01/05/21 1100  Psych Admission Type (Psych Patients Only)  Admission Status Voluntary  Psychosocial Assessment  Patient Complaints Worrying  Eye Contact Staring  Facial Expression Anxious  Affect Preoccupied  Speech Rapid  Interaction Assertive  Motor Activity Hyperactive  Appearance/Hygiene Disheveled;In scrubs  Behavior Characteristics Cooperative  Mood Preoccupied  Thought Process  Coherency Disorganized;Flight of ideas;Circumstantial  Content Blaming others;Delusions;Hypochondria;Obsessions;Preoccupation;Paranoia  Delusions Paranoid  Perception Hallucinations  Hallucination Auditory;Visual  Judgment Poor  Confusion None  Danger to Self  Current suicidal ideation? Passive  Danger to Others  Danger to Others None reported or observed

## 2021-01-05 NOTE — Progress Notes (Signed)
Pt did not attend psycho-ed group. 
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
## 2021-01-05 NOTE — Progress Notes (Signed)
Pt did not attend orientation/goal setting group.
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
## 2021-01-05 NOTE — Progress Notes (Signed)
Adult Psychoeducational Group Note  Date:  01/05/2021 Time:  10:49 PM  Group Topic/Focus:  Wrap-Up Group:   The focus of this group is to help patients review their daily goal of treatment and discuss progress on daily workbooks.  Participation Level:  Active  Participation Quality:  Appropriate  Affect:  Anxious, Depressed, and Irritable  Cognitive:  Disorganized  Insight: Appropriate  Engagement in Group:  Limited  Modes of Intervention:  Discussion  Additional Comments:  Pt stated her goal for today was to focus on her treatment plan. Pt stated she accomplished her goal today. Pt stated she talked with her doctor and her social worker about her care today. Pt rated her overall day a 1 out of 10. Pt stated she was feeling very suicidal all day. Pt stated she was very unhappy and overwhelm with life and just wants to die. Pt stated she was able to contact her mother today which improved her overall day. Pt stated she did feel to good about herself today but better tonight. Pt stated she was able to attend all groups held today. Pt stated she was able to attend all meals. Pt stated she took all medications provided today. Pt stated her appetite was poor today. Pt rated her sleep last night was poor. Pt stated the goal tonight was to get some rest. Pt stated she had no physical pain today. Pt deny visual hallucinations and auditory issues tonight.   Pt denies thoughts of harming others. Pt admitted to having thoughts of harming herself tonight. Pt stated she could contract for safety. Pt nurse was updated on situation. Pt stated she would alert staff if anything changed.  Candy Sledge 01/05/2021, 10:49 PM

## 2021-01-05 NOTE — Progress Notes (Signed)
   01/04/21 2133  Psych Admission Type (Psych Patients Only)  Admission Status Voluntary  Psychosocial Assessment  Patient Complaints Other (Comment) (pt concerned about her medications not right dosage)  Eye Contact Staring  Facial Expression Anxious  Affect Preoccupied  Speech Rapid  Interaction Assertive  Motor Activity Hyperactive  Appearance/Hygiene Disheveled;In scrubs  Behavior Characteristics Appropriate to situation  Mood Anxious;Preoccupied  Thought Process  Coherency Disorganized;Flight of ideas;Circumstantial  Content Blaming others;Delusions;Hypochondria;Obsessions;Preoccupation;Paranoia  Delusions Paranoid  Perception Hallucinations  Hallucination Auditory;Visual  Judgment Poor  Confusion None  Danger to Self  Current suicidal ideation? Active  Danger to Others  Danger to Others None reported or observed

## 2021-01-05 NOTE — Group Note (Signed)
LCSW Group Therapy Note 01/05/2021  10:15am-11:15am  Type of Therapy and Topic:  Group Therapy: Anger and Commonalities  Participation Level:  None   Description of Group: In this group, patients initially identified something that consistently makes them angry.  In doing this, they were able to see they have much in common with other patients.  We discussed possible underlying emotions that lead to this anger, which was explained to be the secondary emotion.  We also discussed cognitions that might lead correctly or incorrectly to underlying feelings that then lead to anger.  Many examples were shared by group members.  Commonalities among group members were pointed out throughout the entirety of group.  Also pointed out numerous times is that the targets of a person's anger also have underlying cognitions and emotions that could explain their reactions/anger.  Therapeutic Goals: Patients will be able to identify one thing that consistently angers them and why. Patients will understand the commonalities they have with others in terms of their anger triggers.   Patients will learn that anger itself is a secondary emotion and will think about possible primary emotions that could influence their anger. Patients will learn that cognitions can be correct or incorrect and often lead to the emotions that then lead to anger.     Summary of Patient Progress:  The patient was late to group and when she did come in, she was eating Frosted Flakes, crunching them continuously.  CSW ignored the noise, but it became a significant annoyance to numerous group members who then called her out on eating during group and being quite loud about it.  CSW asked her to put aside the cereal for now and she declined, preferring instead to leave the room.  She was irritable and said the group could have just asked her, but that was in fact what a group member had just done.  Therapeutic Modalities:   Cognitive Behavioral  Therapy  Maretta Los, LCSW

## 2021-01-06 DIAGNOSIS — F3160 Bipolar disorder, current episode mixed, unspecified: Secondary | ICD-10-CM | POA: Diagnosis not present

## 2021-01-06 MED ORDER — MELATONIN 3 MG PO TABS
3.0000 mg | ORAL_TABLET | Freq: Every day | ORAL | Status: DC
  Administered 2021-01-06 – 2021-01-09 (×4): 3 mg via ORAL
  Filled 2021-01-06 (×6): qty 1

## 2021-01-06 NOTE — Progress Notes (Signed)
Pt did not attend therapeutic relaxation group.

## 2021-01-06 NOTE — Group Note (Signed)
[facility] LCSW Group Therapy Note  Date/Time:  01/06/2021 10:15am-11:15am  Type of Therapy and Topic:  Group Therapy:  Healthy and Unhealthy Supports  Participation Level:  Active   Description of Group:  Patients in this group were introduced to the idea of adding a variety of healthy supports to address the various needs in their lives.Patients discussed what additional healthy supports could be helpful in their recovery and wellness after discharge in order to prevent future hospitalizations.   An emphasis was placed on using counselor, doctor, therapy groups, 12-step groups, and problem-specific support groups to expand supports.    Therapeutic Goals:   1)  discuss importance of adding supports to stay well once out of the hospital  2)  compare healthy versus unhealthy supports and identify some examples of each  3)  generate ideas and descriptions of healthy supports that can be added  4)  offer mutual support about how to address unhealthy supports  5)  encourage active participation in and adherence to discharge plan    Summary of Patient Progress:  The patient stated that current healthy supports in her life are "none" while current unhealthy supports include her mother, and she gave examples which elicited empathy from the group.  CSW pointed out to her that she is being a healthy support for herself right now as she seeks treatment that has been recommended.  The patient expressed a willingness to add further inpatient treatment as support(s) to help in her recovery journey.   Therapeutic Modalities:   Motivational Interviewing Brief Solution-Focused Therapy  Selmer Dominion, LCSW

## 2021-01-06 NOTE — Progress Notes (Signed)
   01/06/21 2124  Psych Admission Type (Psych Patients Only)  Admission Status Voluntary  Psychosocial Assessment  Patient Complaints Worrying  Eye Contact Fair  Facial Expression Anxious;Pensive  Affect Anxious  Speech Rapid;Pressured  Interaction Assertive;Attention-seeking;Needy  Motor Activity Hyperactive  Appearance/Hygiene Disheveled  Behavior Characteristics Anxious;Appropriate to situation  Mood Anxious  Thought Process  Coherency Concrete thinking;Flight of ideas  Content Blaming others;Compulsions;Delusions;Obsessions  Delusions Paranoid;Somatic  Perception Hallucinations  Hallucination Auditory;Visual  Judgment Poor  Confusion Mild  Danger to Self  Current suicidal ideation? Denies  Self-Injurious Behavior No self-injurious ideation or behavior indicators observed or expressed   Agreement Not to Harm Self Yes  Description of Agreement verbally agrees  Danger to Others  Danger to Others None reported or observed

## 2021-01-06 NOTE — [facility] Suicide Risk Assessment (Signed)
[facility] INPATIENT:  Family/Significant Other Suicide Prevention Education  Suicide Prevention Education:  Education Completed; Susanne Greenhouse @ [phone],  (Mother) has been identified by the patient as the family member/significant other with whom the patient will be residing, and identified as the person(s) who will aid the patient in the event of a mental health crisis (suicidal ideations/suicide attempt).  With written consent from the patient, the family member/significant other has been provided the following suicide prevention education, prior to the and/or following the discharge of the patient.  The suicide prevention education provided includes the following: Suicide risk factors Suicide prevention and interventions National Suicide Hotline telephone number [facility] assessment telephone number [facility] Emergency Assistance [place] and/or Residential Mobile Crisis Unit telephone number  Request made of family/significant other to: Remove weapons (e.g., guns, rifles, knives), all items previously/currently identified as safety concern.   Remove drugs/medications (over-the-counter, prescriptions, illicit drugs), all items previously/currently identified as a safety concern.  The family member/significant other verbalizes understanding of the suicide prevention education information provided.  The family member/significant other agrees to remove the items of safety concern listed above.  Mother reports the patient can return to her home once paranoid symptoms subside. Was not receptive to idea of patient living in a [place] residential program. No other concerns mentioned at this time.   Durenda Hurt 01/06/2021, 3:23 PM

## 2021-01-06 NOTE — Progress Notes (Signed)
Pt didn't attend orientation/goal setting group.

## 2021-01-06 NOTE — Progress Notes (Addendum)
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 She was admitted voluntarily to [facility] for continued inpatient stabilization.  Subjective: Patient reports that last night she only slept about 4 hours which is no improvement and is baseline for her.  On assessment this a.m. patient appears wrapped in a blanket and has been noted to frequently speak with staff throughout the morning.  On assessment patient reports that she does not know how she is feeling. Patient reports that she has been thinking about her dose of Vraylar and is concerned about whether or not it needs to be increased and how long it will take for the medication to take effect. Patient reports that she continues to have racing thoughts as well as depressed mood and thoughts of SI.  Patient reports that she is able to contract for safety in the hospital but endorses that if she were to be discharged she probably go home and overdose on trazodone.  Patient denies HI and AVH. She denies ideas of reference, first rank symptoms, or paranoia on the unit. Throughout assessment patient reports that she is concerned about whether or not some of her thoughts are based in reality.  Patient specifically seems concerned by belief that she was being followed or potentially framed for something she did not do prior to admission but is able to challenged these delusions during discussion today and admits the thoughts may be a manifestation of her bipolar illness or past trauma. She is concerned about not being able to floss her teeth, about unit rules about not having  access to dental floss or shoe laces,  and is concerned about her nutrition regarding her vegan diet. Supportive reassurance was provided.  Principal Problem: Bipolar affective disorder, mixed, severe, with psychotic behavior ([place]) Diagnosis: Principal Problem:   Bipolar affective disorder, mixed, severe, with psychotic behavior ([place]) Active Problems:   Insomnia   Posttraumatic stress disorder   Tobacco use disorder   Chronic dental pain   Cocaine abuse ([place])   Ductal carcinoma in situ (DCIS) of right breast   Suicidal ideation  Total Time Spent in Direct Patient Care:  I personally spent 30 minutes on the unit in direct patient care. The direct patient care time included face-to-face time with the patient, reviewing the patient's chart, communicating with other professionals, and coordinating care. Greater than 50% of this time was spent in counseling or coordinating care with the patient regarding goals of hospitalization, psycho-education, and discharge planning needs.  Past Psychiatric History: See H&P  Past Medical History:  Past Medical History:  Diagnosis Date   Abnormal uterine bleeding (AUB) 06/25/2009   Qualifier: Diagnosis of  By: Cathren Laine MD, Ankit     Allergy    Angio-edema    Anxiety    Arthritis    hands, lower back, knees   Asthma    Bipolar 1 disorder ([place])    Breast cancer ([place])    stage 0   Chronic prescription benzodiazepine use 09/08/2018   COPD (chronic obstructive pulmonary disease) (HCC)    Depression    Endometriosis    Fibromyalgia  GERD (gastroesophageal reflux disease)    Headache(784.0)    otc med prn   Migraine    Pituitary tumor    microadenoma   PONV (postoperative nausea and vomiting)    PTSD (post-traumatic stress disorder)    Scoliosis    Termination of pregnancy    x 2 at age 86 and 50 yrs old   Tobacco abuse 03/04/2015   Urticaria     Past Surgical History:  Procedure Laterality Date   BREAST BIOPSY Right 05/19/2007   BREAST  BIOPSY Right 06/02/2007   BREAST BIOPSY  06/29/2019   BREAST EXCISIONAL BIOPSY     BREAST LUMPECTOMY Right 07/21/2019   BREAST LUMPECTOMY WITH RADIOACTIVE SEED LOCALIZATION Right 07/21/2019   Procedure: RIGHT BREAST LUMPECTOMY WITH RADIOACTIVE SEED LOCALIZATION;  Surgeon: Alphonsa Overall, MD;  Location: [facility];  Service: General;  Laterality: Right;   BREAST SURGERY     DILATION AND CURETTAGE OF UTERUS     FACIAL COSMETIC SURGERY     right cheek   LAPAROSCOPY Right 11/11/2013   Procedure: LAPAROSCOPY OPERATIVE with  Drainage of  RIGHT Ovarian ENDOMETRIOMA;  Surgeon: Elveria Royals, MD;  Location: [facility];  Service: Gynecology;  Laterality: Right;   NASAL ENDOSCOPY     said it showed some acid reflux from ENT   NOSE SURGERY     rhinoplasty at age 49 yrs   69 TOOTH EXTRACTION     Family History:  Family History  Problem Relation Age of Onset   Bipolar disorder Mother    Diabetes Mother    Hypertension Mother    Stroke Mother 17       CVA   Mental illness Mother        no diagnosis; personality disorder   Bipolar disorder Father    Hyperlipidemia Father    Hypertension Father    COPD Father    Alpha-1 antitrypsin deficiency Father    Asthma Father    Alpha-1 antitrypsin deficiency Brother    Asthma Brother    Heart disease Maternal Grandfather    Hyperlipidemia Maternal Grandfather    Hypertension Maternal Grandfather    Asthma Maternal Grandfather    Bipolar disorder Maternal Grandmother    Depression Maternal Grandmother    Diabetes Maternal Grandmother    Heart disease Maternal Grandmother    Hyperlipidemia Maternal Grandmother    Hypertension Maternal Grandmother    Mental illness Maternal Grandmother    Heart disease Paternal Grandfather    Hyperlipidemia Paternal Grandfather    Hypertension Paternal Grandfather    Stroke Paternal Grandfather    Alzheimer's disease Paternal Grandmother    Allergic rhinitis Neg Hx    Angioedema Neg Hx    Eczema  Neg Hx    Urticaria Neg Hx    Immunodeficiency Neg Hx    Colon cancer Neg Hx    Esophageal cancer Neg Hx    Family Psychiatric  History: See H&P Social History:  Social History   Substance and Sexual Activity  Alcohol Use Not Currently   Alcohol/week: 2.0 standard drinks   Types: 2 Cans of beer per week   Comment: 6 nights     Social History   Substance and Sexual Activity  Drug Use Not Currently   Types: "Crack" cocaine   Comment: 07/19/2019    Social History   Socioeconomic History   Marital status: Divorced    Spouse name: Not on file   Number of children: 0   Years of education: Not  on file   Highest education level: Bachelor's degree (e.g., BA, AB, BS)  Occupational History   Occupation: disability    Comment: mental illness  Tobacco Use   Smoking status: Every Day    Packs/day: 0.50    Years: 29.00    Pack years: 14.50    Types: Cigarettes    Last attempt to quit: 10/08/2019    Years since quitting: 1.2   Smokeless tobacco: Never  Vaping Use   Vaping Use: Former  Substance and Sexual Activity   Alcohol use: Not Currently    Alcohol/week: 2.0 standard drinks    Types: 2 Cans of beer per week    Comment: 6 nights   Drug use: Not Currently    Types: "Crack" cocaine    Comment: 07/19/2019   Sexual activity: Not Currently    Comment: abortion at 25 and 72yrs. of age   Other Topics Concern   Not on file  Social History Narrative   Marital status: divorced; not dating      Children: none      Lives: alone with mom      Employment: disability for mental illness in 23.  Hospitalizations x 9 in past.      Tobacco: 1 ppd x since age 28. Decreasing in 2018.      Alcohol: socially; beers.      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      Exercise: yoga daily; exercise daily.      ADLs: independent with ADLs; no car; depends on others for transportation.      Patient is right-handed. She is divorced and lives with her mother. She drinks  2-3 cups of 1/2 caffeine coffe a day. She walks occasionally for exercise.   Social Determinants of Health   Financial Resource Strain: Not on file  Food Insecurity: Not on file  Transportation Needs: Not on file  Physical Activity: Not on file  Stress: Not on file  Social Connections: Not on file   Sleep: Poor  Appetite:  Fair  Current Medications: Current Facility-Administered Medications  Medication Dose Route Frequency Provider Last Rate Last Admin   alum & mag hydroxide-simeth (MAALOX/MYLANTA) 200-200-20 MG/5ML suspension 30 mL  30 mL Oral Q4H PRN Derrill Center, NP       calcium citrate (CALCITRATE - dosed in mg elemental calcium) tablet 200 mg of elemental calcium  200 mg of elemental calcium Oral Daily Shaunie Boehm E, MD   200 mg of elemental calcium at 01/06/21 1258   cariprazine (VRAYLAR) capsule 1.5 mg  1.5 mg Oral Daily Damita Dunnings B, MD   1.5 mg at 01/06/21 0819   ibuprofen (ADVIL) tablet 800 mg  800 mg Oral TID PRN Damita Dunnings B, MD   800 mg at 01/06/21 0727   levofloxacin (LEVAQUIN) tablet 750 mg  750 mg Oral Daily Nelda Marseille, Sigrid Schwebach E, MD   750 mg at 01/06/21 8841   magnesium hydroxide (MILK OF MAGNESIA) suspension 30 mL  30 mL Oral Daily PRN Derrill Center, NP       melatonin tablet 3 mg  3 mg Oral QHS McQuilla, Jai B, MD       multivitamin with minerals tablet 1 tablet  1 tablet Oral Daily Damita Dunnings B, MD   1 tablet at 01/06/21 1258   nicotine (NICODERM CQ - dosed in mg/24 hours) patch 21 mg  21 mg Transdermal Daily Freida Busman, MD  pantoprazole (PROTONIX) EC tablet 40 mg  40 mg Oral Daily Damita Dunnings B, MD       tamoxifen (NOLVADEX) tablet 20 mg  20 mg Oral QHS Nelda Marseille, Queena Monrreal E, MD   20 mg at 01/05/21 2149   traZODone (DESYREL) tablet 200 mg  200 mg Oral QHS PRN Harlow Asa, MD   200 mg at 01/05/21 2149    Lab Results:  Results for orders placed or performed during the hospital encounter of 01/04/21 (from the past 48 hour(s))  TSH      Status: None   Collection Time: 01/05/21  6:29 AM  Result Value Ref Range   TSH 2.224 0.350 - 4.500 uIU/mL    Comment: Performed by a 3rd Generation assay with a functional sensitivity of <=0.01 uIU/mL. Performed at [facility], [place] [address], [place], [place] [address]   Hemoglobin A1c     Status: None   Collection Time: 01/05/21  6:29 AM  Result Value Ref Range   Hgb A1c MFr Bld 5.2 4.8 - 5.6 %    Comment: (NOTE) Pre diabetes:          5.7%-6.4%  Diabetes:              >6.4%  Glycemic control for   <7.0% adults with diabetes    Mean Plasma Glucose 102.54 mg/dL    Comment: Performed at [facility] [address] [place], [place] [address]  Lipid panel     Status: None   Collection Time: 01/05/21  6:29 AM  Result Value Ref Range   Cholesterol 155 0 - 200 mg/dL   Triglycerides 84 <150 mg/dL   HDL 62 >40 mg/dL   Total CHOL/HDL Ratio 2.5 RATIO   VLDL 17 0 - 40 mg/dL   LDL Cholesterol 76 0 - 99 mg/dL    Comment:        Total Cholesterol/HDL:CHD Risk Coronary Heart Disease Risk Table                     Men   Women  1/2 Average Risk   3.4   3.3  Average Risk       5.0   4.4  2 X Average Risk   9.6   7.1  3 X Average Risk  23.4   11.0        Use the calculated Patient Ratio above and the CHD Risk Table to determine the patient's CHD Risk.        ATP III CLASSIFICATION (LDL):  <100     mg/dL   Optimal  100-129  mg/dL   Near or Above                    Optimal  130-159  mg/dL   Borderline  160-189  mg/dL   High  >190     mg/dL   Very High Performed at [facility] [address] [place], [place] [address]     Blood Alcohol level:  Lab Results  Component Value Date   [facility] <10 01/04/2021   ETH <10 93/26/7124    Metabolic Disorder Labs: Lab Results  Component Value Date   HGBA1C 5.2 01/05/2021   MPG 102.54 01/05/2021   MPG 105.41 08/12/2020   Lab Results  Component Value Date   PROLACTIN 8.2 08/12/2020    PROLACTIN 12.0 06/20/2020   Lab Results  Component Value Date   CHOL 155 01/05/2021  TRIG 84 01/05/2021   HDL 62 01/05/2021   CHOLHDL 2.5 01/05/2021   VLDL 17 01/05/2021   LDLCALC 76 01/05/2021   LDLCALC 89 08/12/2020    Physical Findings: AIMS: Facial and Oral Movements Muscles of Facial Expression: None, normal Lips and Perioral Area: None, normal Jaw: None, normal Tongue: None, normal,Extremity Movements Upper (arms, wrists, hands, fingers): None, normal Lower (legs, knees, ankles, toes): None, normal, Trunk Movements Neck, shoulders, hips: None, normal, Overall Severity Severity of abnormal movements (highest score from questions above): None, normal Incapacitation due to abnormal movements: None, normal Patient's awareness of abnormal movements (rate only patient's report): No Awareness, Dental Status Current problems with teeth and/or dentures?: No Does patient usually wear dentures?: No   Musculoskeletal: Strength & Muscle Tone: within normal limits Gait & Station: normal. steady Patient leans: N/A  Psychiatric Specialty Exam:  Presentation  General Appearance: Adequate hygiene, Casual (wrapped in a blanket)  Eye Contact:Fair  Speech: Verbose and at times rapid but no longer pressured  Speech Volume:Normal   Handedness:Right   Mood and Affect  Mood: Anxious but less irritable  Affect: Anxious appearing but no longer labile or irritable   Thought Process  Thought Processes:Ruminative about unit rules, medications, and discharge planning needs; concrete  Descriptions of Associations:Intact  Orientation:Full (Time, Place and Person)  Thought Content:Ruminative about perceived delusions and unit rules as well as medications and discharge planning; has residual persecutory and paranoid delusion that she was being followed and set up for crime she did not commit prior to admission but is able to reality test some better today when these thoughts are  challenged; denies AVH, paranoia on the unit, first rank symptoms, or ideas of reference; is not grossly responding to internal/external stimuli on exam  History of Schizophrenia/Schizoaffective disorder:No  Duration of Psychotic Symptoms:Greater than six months  Hallucinations:Hallucinations: None  Ideas of Reference:Denied  Suicidal Thoughts:Suicidal Thoughts: Yes, Active (can contract for safety in hospital) SI Active Intent and/or Plan: Without Intent; With Plan  Homicidal Thoughts:Homicidal Thoughts: No   Sensorium  Memory:Immediate Good; Recent Good; Remote Good  Judgment: Fair - taking meds  Insight:Shallow   Executive Functions  Concentration:Fair  Attention Span:Fair  [place]  Language:Good   Psychomotor Activity  Psychomotor Activity:Animated when she speaks but no restlessness   Assets  Assets:Desire for Improvement; Resilience   Sleep  Sleep:4.75 hours   Physical Exam Constitutional:      Appearance: Normal appearance.  HENT:     Head: Normocephalic and atraumatic.  Pulmonary:     Effort: Pulmonary effort is normal.  Neurological:     General: No focal deficit present.     Mental Status: She is alert.   Review of Systems  Respiratory:  Negative for shortness of breath.   Gastrointestinal:  Negative for diarrhea, nausea and vomiting.  Psychiatric/Behavioral:  Positive for suicidal ideas. The patient has insomnia.   Blood pressure 97/68, pulse 92, temperature 97.8 F (36.6 C), temperature source Oral, resp. rate 18, height 5\' 8"  (1.727 m), weight 61.7 kg, last menstrual period 11/21/2014, SpO2 98 %. Body mass index is 20.68 kg/m.  Treatment Plan Summary: Daily contact with patient to assess and evaluate symptoms and progress in treatment and Medication management  Catherine Olsen is a 50 year old patient with a history of bipolar disorder, OCD, and PTSD who presents currently in a mixed episode with psychosis.   Patient continues to endorse that she has no change in her symptoms including thoughts of people being  out to get her, racing thoughts, thoughts about killing herself.  Patient did display mild improvement in insight as she began questioning whether or not her thoughts were delusions or if they had been real and were based in reality.  Patient continues to be very intrusive on the unit and preoccupied with unit rules and concern for medication regimen but is less pressured today and less irritable.   Reviewed current labs: COVID, UDS, EtOH, salicylate, acetaminophen, CBC, hCG were all negative or within normal limits.  CMP within normal limits with exception for T protein of 6.3 and AST (mildly decreased) 12. TSH: 2.224, Hgb A1c-5.2, lipid panel-WNL EKG-WNL; QTC 433  Bipolar 1 disorder, current episode, mixed, severe with psychotic features - Continue Vraylar 1.5 mg daily (r/b/se/a to med dicussed and she consents to med trial) Continue to monitor QTC while on multiple QTC prolonging medications -Continue trazodone 200 mg nightly as needed for insomnia -Start melatonin 3 mg nightly for insomnia - Patient encouraged to participate in milieu and group therapy -We will reassess patient daily and titrate Vraylar as indicated  Chronic PTSD - Patient encouraged to participate in group therapy on unit - Consult to social work for outpatient therapy recommendations - Declines medications for PTSD at this time   OCD by hx - Declines medications for OCD at this time - Wishes to resume psychotherapy after discharge   Cluster B traits (r/o BPD) - Would benefit from DBT after discharge  Cocaine abuse - episodic (r/o stimulant use d/o- cocaine type) - Social work to assess if patient would be interested in inpatient rehabilitation for substance use disorders at discharge   Ductal carcinoma in situ, right breast - Continue tamoxifen 20 mg nightly - Continue calcium supplementation, calcium citrate 950  mg daily - Continue ibuprofen 800 mg 3 times daily as needed for pain - Continue Protonix 40 mg daily, prophylaxis with use of NSAID  Tobacco use disorder - Start nicotine patch 21 mg  Chronic dental infection - Continue Levaquin 750 mg daily end date-01/08/2021  Dietary Concerns  -- Nutrition consult ordered; patient declines BOOST Or Ensure    PGY-2 Freida Busman, MD 01/06/2021, 1:14 PM

## 2021-01-06 NOTE — Progress Notes (Signed)
Pt did not attend psycho-ed group. 

## 2021-01-06 NOTE — Plan of Care (Signed)
  Problem: Clinical Measurements: Goal: Ability to maintain clinical measurements within normal limits will improve Outcome: Progressing Goal: Diagnostic test results will improve Outcome: Progressing   Problem: Activity: Goal: Risk for activity intolerance will decrease Outcome: Progressing   

## 2021-01-06 NOTE — Progress Notes (Signed)
Patient described her day as having been "not great". She states that she is taking a new medication that is not working. She feels more "manic". Her goal for tomorrow is to have the dosage of her medication adjusted.

## 2021-01-06 NOTE — [facility] Suicide Risk Assessment (Signed)
[place] INPATIENT:  Family/Significant Other Suicide Prevention Education  Suicide Prevention Education:  Education Completed; mother Susanne Greenhouse [phone],  (name of family member/significant other) has been identified by the patient as the family member/significant other with whom the patient will be residing, and identified as the person(s) who will aid the patient in the event of a mental health crisis (suicidal ideations/suicide attempt).    Mother states that patient has been paranoid for several months, is convinced neighbors and others are spying on her.  This is mother's main worry.  She stated that, as patient has reported, she has to start dialysis soon and is supposed to have surgery on 10/11.  She does not know how to help the patient, who does not tell her things and is quite difficult to cope with.  Mother shared that pretty much no treatment has ever helped.  We discussed the options of guardianship and assisted living, neither of which she believes patient needs currently or would accept ever.  She reiterated that it is patient's NEW paranoia that is of most concern.  With written consent from the patient, the family member/significant other has been provided the following suicide prevention education, prior to the and/or following the discharge of the patient.  The suicide prevention education provided includes the following: Suicide risk factors Suicide prevention and interventions National Suicide Hotline telephone number [facility] assessment telephone number [facility] Emergency Assistance [place] and/or Residential Mobile Crisis Unit telephone number  Request made of family/significant other to: Remove weapons (e.g., guns, rifles, knives), all items previously/currently identified as safety concern.   Remove drugs/medications (over-the-counter, prescriptions, illicit drugs), all items previously/currently identified as a safety concern.  The  family member/significant other verbalizes understanding of the suicide prevention education information provided.  The family member/significant other agrees to remove the items of safety concern listed above.      Berlin Hun Grossman-Orr 01/06/2021, 2:59 PM

## 2021-01-06 NOTE — Progress Notes (Signed)
Progress note  Pt found in bed; compliant with medication administration. Pt was confrontational with which medications they wanted to take and when. Pt seemed to have been preoccupied with this for some time, as they discussed confrontations with other staff regarding this. Pt requested for staff to go into their locker for their shoes and dental floss. Pt was educated on why staff could not meet these wants and the rules pertaining to items in the lockers. Pt stated this was confusing. Pt was re-educated but became agitated, stating they wanted to see the staff nurse. Pt was educated on the safety of staff and items. Pt has been seen pacing and in the milieu. Pt denies si/hi/ah/vh and verbally agrees to approach staff if these become apparent or before harming themselves/others while at [place].  A: Pt provided support and encouragement. Pt given medication per protocol and standing orders. Q32m safety checks implemented and continued.  R: Pt safe on the unit. Will continue to monitor.

## 2021-01-07 ENCOUNTER — Encounter (HOSPITAL_COMMUNITY): Payer: Self-pay

## 2021-01-07 DIAGNOSIS — F3164 Bipolar disorder, current episode mixed, severe, with psychotic features: Secondary | ICD-10-CM | POA: Diagnosis not present

## 2021-01-07 MED ORDER — WHITE PETROLATUM EX OINT
TOPICAL_OINTMENT | CUTANEOUS | Status: AC
  Filled 2021-01-07: qty 5

## 2021-01-07 MED ORDER — CARIPRAZINE HCL 3 MG PO CAPS
3.0000 mg | ORAL_CAPSULE | Freq: Every day | ORAL | Status: DC
  Administered 2021-01-08 – 2021-01-10 (×3): 3 mg via ORAL
  Filled 2021-01-07 (×5): qty 1

## 2021-01-07 MED ORDER — CARIPRAZINE HCL 1.5 MG PO CAPS
1.5000 mg | ORAL_CAPSULE | Freq: Every day | ORAL | Status: AC
  Administered 2021-01-07: 1.5 mg via ORAL
  Filled 2021-01-07: qty 1

## 2021-01-07 NOTE — Progress Notes (Signed)
Recreation Therapy Notes  INPATIENT RECREATION THERAPY ASSESSMENT  Patient Details Name: WAYLON KOFFLER MRN: [id number] DOB: [birth year] Today's Date: 01/07/2021       Information Obtained From: Patient  Able to Participate in Assessment/Interview: Yes  Patient Presentation: Responsive ([place])  Reason for Admission (Per Patient): Other (Comments) (PTSD, OCD)  Patient Stressors: Other (Comment) (Finances; Health problems; Mother's health getting worse)  Coping Skills:   Isolation, Meditate, Deep Breathing, Prayer, Other (Comment) Journalist, newspaper)  Leisure Interests (2+):   (Pt stated she has no interest in anything.  Pt stated the only thing she does is try to keep from killing herself.)  Frequency of Recreation/Participation:    Awareness of Community Resources:  Yes  Community Resources:  Park, [employer], Art therapist  Current Use: No  If no, Barriers?: Other (Comment) ("traumatized")  Expressed Interest in [place]: No  [employer] of Residence:  Investment banker, corporate  Patient Main Form of Transportation: Musician  Patient Strengths:  Occupational hygienist; Try to be remorseful  Patient Identified Areas of Improvement:  Coping- "no way to cope"  Patient Goal for Hospitalization:  "have medications deal with my bi-polar disorder and get into trauma place"  Current SI (including self-harm):  No  Current HI:  No  Current AVH: No  Staff Intervention Plan: Group Attendance, Collaborate with Interdisciplinary Treatment Team  Consent to Intern Participation: N/A   Victorino Sparrow, LRT/CTRS  Victorino Sparrow A 01/07/2021, 3:14 PM

## 2021-01-07 NOTE — Group Note (Signed)
LCSW Group Therapy Note   Group Date: 01/07/2021 Start Time: 1300 End Time: 1400   Type of Therapy and Topic:  Group Therapy:   Participation Level:  Active  Patients received a worksheet with an outline of 2 gingerbread men with a separation in the middle of the page. One sign designated what the pt sees about themselves and the other is what others see. Pts were asked to introduce themselves and share something they like about themself. Pts were then asked to draw, write or color how they view themselves as well as how they are viewed by others. CSW led discussion about the feelings and words associated with each side.    Patient Summary:   During introductions pt shared their name and stated they liked that they are determined. Pt was appropriate and participated in group discussion.  Darleen Crocker, LCSWA 01/07/2021  2:32 PM

## 2021-01-07 NOTE — Progress Notes (Signed)
NUTRITION ASSESSMENT  Consult received for pt who is a Vegan.   INTERVENTION: Encourage PO intake at meals and snacks - reach out to cafeteria as needed.  Offer additional food choices as needed to meet needs.    NUTRITION DIAGNOSIS: Unintentional weight loss related to sub-optimal intake as evidenced by pt report.   Goal: Pt to meet >/= 90% of their estimated nutrition needs.  Monitor:  PO intake  Assessment:  Pt admitted for SI with hx of Bipolar disorder, PTSD, and 15 prior hospitalizations.  Per RN pt has been focused on food and does report she is a [place].   50 y.o. female  Height: Ht Readings from Last 1 Encounters:  01/04/21 5\' 8"  (1.727 m)    Weight: Wt Readings from Last 1 Encounters:  01/04/21 61.7 kg    Weight Hx: Wt Readings from Last 10 Encounters:  01/04/21 61.7 kg  01/03/21 61.2 kg  12/22/20 61.2 kg  12/20/20 63 kg  12/13/20 61.2 kg  11/25/20 61.2 kg  10/11/20 61.2 kg  09/07/20 61.2 kg  09/05/20 61.8 kg  08/12/20 63.1 kg    BMI:  Body mass index is 20.68 kg/m.  Estimated Nutritional Needs: Kcal: 25-30 kcal/kg Protein: > 1 gram protein/kg Fluid: 1 ml/kcal  Diet Order:  Diet Order     None      Pt is also offered choice of unit snacks mid-morning and mid-afternoon.  Pt is eating as desired.   Lab results and medications reviewed.   Lockie Pares., RD, LDN, CNSC See AMiON for contact information

## 2021-01-07 NOTE — Progress Notes (Signed)
  Pt presents with high depression and visible anxiety.  On assessment pt is observed in dayroom interacting with peers and watching television.  Pt complained of Right lower back pain 9/10.  Administered PRN Ibuprofen early, per provider and pt request.  Pt denies SI/HI, and verbally contracts for safety.  Pt denies AVH.  Pt remains safe on unit with Q 15 minutes safety checks.    01/07/21 2300  Psych Admission Type (Psych Patients Only)  Admission Status Voluntary  Psychosocial Assessment  Patient Complaints Anxiety;Irritability;Worrying;Depression  Eye Contact Intense  Facial Expression Anxious;Pensive  Affect Anxious;Irritable;Labile;Preoccupied  Speech Argumentative;Rapid;Pressured  Interaction Assertive;Attention-seeking;Hypervigilant  Motor Activity Hyperactive  Appearance/Hygiene Disheveled  Behavior Characteristics Anxious;Agressive verbally;Agitated;Hyperactive;Impulsive  Mood Anxious;Irritable  Thought Process  Coherency Concrete thinking;Flight of ideas  Content Blaming others;Compulsions;Obsessions  Delusions Paranoid;Somatic  Perception Hallucinations  Hallucination Auditory;Visual  Judgment Poor  Confusion Mild  Danger to Self  Current suicidal ideation? Denies  Self-Injurious Behavior No self-injurious ideation or behavior indicators observed or expressed   Agreement Not to Harm Self Yes  Description of Agreement Verbal contract  Danger to Others  Danger to Others None reported or observed

## 2021-01-07 NOTE — Plan of Care (Signed)
  Problem: Coping: Goal: Level of anxiety will decrease Outcome: Not Progressing   Problem: Pain Managment: Goal: General experience of comfort will improve Outcome: Not Progressing   

## 2021-01-07 NOTE — Group Note (Signed)
Recreation Therapy Group Note   Group Topic:Coping Skills  Group Date: 01/07/2021 Start Time: 1000 End Time: 1035 Facilitators: Victorino Sparrow, LRT/CTRS Location: 300 Hall Dayroom  Goal Area(s) Addresses:  Patient will identify positive coping skills. Patient will identify benefit of using positive coping skills post d/c.  Group Description:  Mind Map.  Patients were given a blank diagram of a mind map.  LRT and patients identified 8 instances in which positive coping skills can be used (depression, trauma, anger, anxiety, stress, tragedy, drama and sickness).  Patients were then given time to come up with at least 3 coping skills for each instance identified.  LRT would then fill in the coping skills on the board giving any one with blank boxes on their chart to fill them in coping skills they did not have.    Affect/Mood: Appropriate   Participation Level: Engaged   Participation Quality: Independent   Behavior: Off-task   Speech/Thought Process: Unfocused   Insight: Fair   Judgement: Fair    Modes of Intervention: Guided Discussion and Worksheet   Patient Response to Interventions:  Engaged   Education Outcome:  Acknowledges education and In group clarification offered    Clinical Observations/Individualized Feedback: Pt was a little erratic in some of her responses.  On more that one occasion, identified taking a drink as a coping skill.  Pt also seemed distracted by focusing on whether they would get snacks.  Pt did offer some coping skills such as writing down problems on paper then throwing it away, turn phone off, meditation, pray and journal.      Plan: Continue to engage patient in RT group sessions 2-3x/week.   Victorino Sparrow, LRT/CTRS 01/07/2021 12:44 PM

## 2021-01-07 NOTE — Plan of Care (Signed)
  Problem: Education: Goal: Knowledge of General Education information will improve Description: Including pain rating scale, medication(s)/side effects and non-pharmacologic comfort measures Outcome: Progressing   Problem: Health Behavior/Discharge Planning: Goal: Ability to manage health-related needs will improve Outcome: Progressing   Problem: Clinical Measurements: Goal: Will remain free from infection Outcome: Progressing   

## 2021-01-07 NOTE — Progress Notes (Signed)
Progress note  Pt found pacing; compliant with medication administration. Pt denies any physical complaints. Pt continues to be compulsive over medications, dietary needs, and rules of the unit. Pt continues to test boundaries with staff and try to staff split, asking multiple pt's the same question when wants aren't met. Pt can be argumentative with care and the level of care provided for them. Pt expresses negative thoughts and opinions to staff and other pt's throughout the unit. Pt denies si/hi/ah/vh and verbally agrees to approach staff if these become apparent or before harming themselves/others while at [place].  A: Pt provided support and encouragement. Pt given medication per protocol and standing orders. Q39msafety checks implemented and continued.  R: Pt safe on the unit. Will continue to monitor.

## 2021-01-07 NOTE — [facility] Group Notes (Signed)
Adult Psychoeducational Group Note  Date:  01/07/2021 Time:  6:40 PM  Group Topic/Focus:  Wellness Toolbox:   The focus of this group is to discuss various aspects of wellness, balancing those aspects and exploring ways to increase the ability to experience wellness.  Patients will create a wellness toolbox for use upon discharge.  Participation Level:  Did Not Attend   Dub Mikes 01/07/2021, 6:40 PM

## 2021-01-07 NOTE — BH IP Treatment Plan (Signed)
Interdisciplinary Treatment and Diagnostic Plan Update  01/07/2021 Time of Session: 9:35am Catherine Olsen MRN: [id number]  Principal Diagnosis: Bipolar affective disorder, mixed, severe, with psychotic behavior ([place])  Secondary Diagnoses: Principal Problem:   Bipolar affective disorder, mixed, severe, with psychotic behavior ([place]) Active Problems:   Insomnia   Posttraumatic stress disorder   Tobacco use disorder   Chronic dental pain   Cocaine abuse ([place])   Ductal carcinoma in situ (DCIS) of right breast   Suicidal ideation   Current Medications:  Current Facility-Administered Medications  Medication Dose Route Frequency Provider Last Rate Last Admin   alum & mag hydroxide-simeth (MAALOX/MYLANTA) 200-200-20 MG/5ML suspension 30 mL  30 mL Oral Q4H PRN Derrill Center, NP       calcium citrate (CALCITRATE - dosed in mg elemental calcium) tablet 200 mg of elemental calcium  200 mg of elemental calcium Oral Daily Nelda Marseille, Amy E, MD   200 mg of elemental calcium at 01/06/21 1258   cariprazine (VRAYLAR) capsule 1.5 mg  1.5 mg Oral Daily Harlow Asa, MD       [START ON 01/08/2021] cariprazine (VRAYLAR) capsule 3 mg  3 mg Oral Daily Pashayan, Redgie Grayer, MD       ibuprofen (ADVIL) tablet 800 mg  800 mg Oral TID PRN Damita Dunnings B, MD   800 mg at 01/07/21 0610   levofloxacin (LEVAQUIN) tablet 750 mg  750 mg Oral Daily Nelda Marseille, Amy E, MD   750 mg at 01/07/21 1829   magnesium hydroxide (MILK OF MAGNESIA) suspension 30 mL  30 mL Oral Daily PRN Derrill Center, NP       melatonin tablet 3 mg  3 mg Oral QHS Damita Dunnings B, MD   3 mg at 01/06/21 2059   multivitamin with minerals tablet 1 tablet  1 tablet Oral Daily Damita Dunnings B, MD   1 tablet at 01/06/21 1258   nicotine (NICODERM CQ - dosed in mg/24 hours) patch 21 mg  21 mg Transdermal Daily Damita Dunnings B, MD       pantoprazole (PROTONIX) EC tablet 40 mg  40 mg Oral Daily Damita Dunnings B, MD       tamoxifen (NOLVADEX) tablet 20 mg  20  mg Oral QHS Nelda Marseille, Amy E, MD   20 mg at 01/06/21 2100   traZODone (DESYREL) tablet 200 mg  200 mg Oral QHS PRN Harlow Asa, MD   200 mg at 01/06/21 2100   PTA Medications: Medications Prior to Admission  Medication Sig Dispense Refill Last Dose   EPINEPHrine 0.3 mg/0.3 mL IJ SOAJ injection Inject 0.3 mg into the skin as needed for anaphylaxis. 1 each 0    ibuprofen (ADVIL) 800 MG tablet Take 800 mg by mouth every 8 (eight) hours as needed for mild pain.      levofloxacin (LEVAQUIN) 750 MG tablet Take 750 mg by mouth daily.      tamoxifen (NOLVADEX) 20 MG tablet Take 1 tablet (20 mg total) by mouth daily. For breast cancer (Patient taking differently: Take 20 mg by mouth every evening. For breast cancer) 90 tablet 3    traZODone (DESYREL) 100 MG tablet Take 1 tablet (100 mg total) by mouth at bedtime as needed and may repeat dose one time if needed for sleep. (Patient taking differently: Take 200 mg by mouth at bedtime as needed and may repeat dose one time if needed for sleep.) 60 tablet 0    VRAYLAR 1.5 MG capsule Take 1.5 mg  by mouth daily.       Patient Stressors:    Patient Strengths:    Treatment Modalities: Medication Management, Group therapy, Case management,  1 to 1 session with clinician, Psychoeducation, Recreational therapy.   Physician Treatment Plan for Primary Diagnosis: Bipolar affective disorder, mixed, severe, with psychotic behavior ([place]) Long Term Goal(s): Improvement in symptoms so as ready for discharge   Short Term Goals: Ability to identify changes in lifestyle to reduce recurrence of condition will improve Ability to verbalize feelings will improve Ability to disclose and discuss suicidal ideas Ability to demonstrate self-control will improve Ability to identify and develop effective coping behaviors will improve Compliance with prescribed medications will improve Ability to identify triggers associated with substance abuse/mental health issues will  improve  Medication Management: Evaluate patient's response, side effects, and tolerance of medication regimen.  Therapeutic Interventions: 1 to 1 sessions, Unit Group sessions and Medication administration.  Evaluation of Outcomes: Not Met  Physician Treatment Plan for Secondary Diagnosis: Principal Problem:   Bipolar affective disorder, mixed, severe, with psychotic behavior ([place]) Active Problems:   Insomnia   Posttraumatic stress disorder   Tobacco use disorder   Chronic dental pain   Cocaine abuse ([place])   Ductal carcinoma in situ (DCIS) of right breast   Suicidal ideation  Long Term Goal(s): Improvement in symptoms so as ready for discharge   Short Term Goals: Ability to identify changes in lifestyle to reduce recurrence of condition will improve Ability to verbalize feelings will improve Ability to disclose and discuss suicidal ideas Ability to demonstrate self-control will improve Ability to identify and develop effective coping behaviors will improve Compliance with prescribed medications will improve Ability to identify triggers associated with substance abuse/mental health issues will improve     Medication Management: Evaluate patient's response, side effects, and tolerance of medication regimen.  Therapeutic Interventions: 1 to 1 sessions, Unit Group sessions and Medication administration.  Evaluation of Outcomes: Not Met   RN Treatment Plan for Primary Diagnosis: Bipolar affective disorder, mixed, severe, with psychotic behavior ([place]) Long Term Goal(s): Knowledge of disease and therapeutic regimen to maintain health will improve  Short Term Goals: Ability to remain free from injury will improve, Ability to verbalize frustration and anger appropriately will improve, Ability to demonstrate self-control, Ability to participate in decision making will improve, and Compliance with prescribed medications will improve  Medication Management: RN will administer  medications as ordered by provider, will assess and evaluate patient's response and provide education to patient for prescribed medication. RN will report any adverse and/or side effects to prescribing provider.  Therapeutic Interventions: 1 on 1 counseling sessions, Psychoeducation, Medication administration, Evaluate responses to treatment, Monitor vital signs and CBGs as ordered, Perform/monitor CIWA, COWS, AIMS and Fall Risk screenings as ordered, Perform wound care treatments as ordered.  Evaluation of Outcomes: Not Met   LCSW Treatment Plan for Primary Diagnosis: Bipolar affective disorder, mixed, severe, with psychotic behavior ([place]) Long Term Goal(s): Safe transition to appropriate next level of care at discharge, Engage patient in therapeutic group addressing interpersonal concerns.  Short Term Goals: Engage patient in aftercare planning with referrals and resources, Increase social support, Increase ability to appropriately verbalize feelings, Increase emotional regulation, Identify triggers associated with mental health/substance abuse issues, and Increase skills for wellness and recovery  Therapeutic Interventions: Assess for all discharge needs, 1 to 1 time with Social worker, Explore available resources and support systems, Assess for adequacy in community support network, Educate family and significant other(s) on suicide prevention,  Complete Psychosocial Assessment, Interpersonal group therapy.  Evaluation of Outcomes: Not Met   Progress in Treatment: Attending groups: Yes. Participating in groups: Yes. Taking medication as prescribed: Yes. Toleration medication: Yes. Family/Significant other contact made: Yes, individual(s) contacted:  mother Patient understands diagnosis: Yes. and No. Discussing patient identified problems/goals with staff: Yes. Medical problems stabilized or resolved: Yes. Denies suicidal/homicidal ideation: Yes. Issues/concerns per patient  self-inventory: No.   New problem(s) identified: No, Describe:  none  New Short Term/Long Term Goal(s): medication stabilization, elimination of SI thoughts, development of comprehensive mental wellness plan.    Patient Goals:  "To stabilize, get into longer term treatment, and to learn coping skills"   Discharge Plan or Barriers: Pt is interested in receiving longer term dual dx treatment at discharge.   Reason for Continuation of Hospitalization: Anxiety Mania Medication stabilization Suicidal ideation  Estimated Length of Stay: 3-5 days   Scribe for Treatment Team: Vassie Moselle, LCSW 01/07/2021 10:29 AM

## 2021-01-07 NOTE — Progress Notes (Signed)
Adult Psychoeducational Group Note  Date:  01/07/2021 Time:  8:47 PM  Group Topic/Focus:  Wrap-Up Group:   The focus of this group is to help patients review their daily goal of treatment and discuss progress on daily workbooks.  Participation Level:  Active  Participation Quality:  Appropriate  Affect:  Appropriate  Cognitive:  Appropriate  Insight: Appropriate  Engagement in Group:  Engaged  Modes of Intervention:  Exploration  Additional Comments:  patient attended and participated in group tonight. She reports that today the dosage of her medication was changed.and they planned her discharge.  Salley Scarlet [facility] 01/07/2021, 8:47 PM

## 2021-01-07 NOTE — Group Note (Signed)
Occupational Therapy Group Note  Group Topic:Communication  Group Date: 01/07/2021 Start Time: 1400 End Time: 1445 Facilitators: Ponciano Ort, OT/L   Group Description: Group encouraged increased engagement and participation through discussion focused on communication styles. Patients were educated on the different styles of communication including passive, aggressive, assertive, and passive-aggressive communication. Group members shared and reflected on which styles they most often find themselves communicating in and brainstormed strategies on how to transition and practice a more assertive approach. Further discussion explored how to use assertiveness skills and strategies to further advocate and ask questions as it relates to their treatment plan and mental health.   Therapeutic Goal(s): Identify practical strategies to improve communication skills  Identify how to use assertive communication skills to address individual needs and wants   Participation Level: Active   Participation Quality: Independent   Behavior: Alert, Appropriate, and Cooperative   Speech/Thought Process: Focused   Affect/Mood: Flat   Insight: Fair   Judgement: Fair   Individualization: Shayden was active in their participation of group discussion/activity. Pt identified being an assertive communicator, however shared "I am naturally a loud person so people always think I'm being aggressive, even when I'm not." Somewhat receptive to education received.   Modes of Intervention: Activity, Discussion, and Education  Patient Response to Interventions:  Attentive and Engaged   Plan: Continue to engage patient in OT groups 2 - 3x/week.  01/07/2021  Ponciano Ort, OT/L
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
## 2021-01-08 DIAGNOSIS — F3164 Bipolar disorder, current episode mixed, severe, with psychotic features: Secondary | ICD-10-CM | POA: Diagnosis not present

## 2021-01-08 NOTE — [facility] Counselor (Signed)
This pt has been declined at both [facility] in [place] and [facility] in [place] due to active breast cancer.    Darletta Moll MSW, LCSW Clincal Social Worker  [facility]

## 2021-01-08 NOTE — Progress Notes (Addendum)
   01/08/21 2030  Psych Admission Type (Psych Patients Only)  Admission Status Voluntary  Psychosocial Assessment  Patient Complaints Anxiety;Depression  Eye Contact Fair  Facial Expression Anxious;Pensive  Affect Anxious;Preoccupied  Speech Argumentative;Pressured  Interaction Assertive;Hypervigilant;Needy  Motor Activity Other (Comment) (wnl)  Appearance/Hygiene Unremarkable  Behavior Characteristics Cooperative;Anxious;Restless  Mood Depressed;Anxious  Thought Process  Coherency Concrete thinking;Flight of ideas  Content Blaming others;Compulsions;Obsessions  Delusions Paranoid;Somatic  Perception Hallucinations  Hallucination None reported or observed  Judgment Poor  Confusion None  Danger to Self  Current suicidal ideation? Denies  Danger to Others  Danger to Others None reported or observed   Pt seen in the hallway. Pt denies SI, HI, AVH. Pt endorses chronic pain 8/10 generalized. Pt rates anxiety 6-7/10 and depression 6-7/10. Pt worried about medications interacting with her Tamoxifen. Pt refused Protonix earlier today. "At home I take Tums for acid reflux. They don't seem to want to give me any Tums. They have Maalox. I want to know if it will bind to the Tamoxifen and stop it from working." Pt assured that all medications are checked for interactions with other medications. Pt wanting another dose of Advil. "My pain is 8/10 and I know I didn't get it too long ago. I take Ibuprofen three times a day not every 8 hours if I need it." Provider notified and agreed to allow pt to have Ibuprofen at 2200. Communicated this to pt. Verbalized understanding.  Pt wanting to go home. "I'm tired of being told when to take meds and when to go to this room or leave that room." Reiterated policies and procedures are designed to keep pt safe and compliant with treatment regimen. "I know all that. I used to pass meds and stuff."

## 2021-01-08 NOTE — Group Note (Signed)
Recreation Therapy Group Note   Group Topic:Animal Assisted Therapy   Group Date: 01/08/2021 Start Time: 1430 End Time: 1515 Facilitators: Victorino Sparrow, LRT/CTRS Location: [facility]  AAA/T Program Assumption of Risk Form signed by Patient/ or Parent Legal Guardian Yes  Patient is free of allergies or severe asthma Yes  Patient reports no fear of animals Yes  Patient reports no history of cruelty to animals Yes  Patient understands his/her participation is voluntary Yes  Patient washes hands before animal contact Yes  Patient washes hands after animal contact Yes   Affect/Mood: Appropriate   Participation Level: Engaged   Participation Quality: Independent   Behavior: Appropriate   Speech/Thought Process: Focused   Insight: Good   Judgement: Good   Modes of Intervention: Research scientist (medical) Therapy   Patient Response to Interventions:  Attentive and Engaged   Education Outcome:  Acknowledges education and In group clarification offered    Clinical Observations/Individualized Feedback:  Patient attended session and interacted appropriately with therapy dog and peers. Patient asked appropriate questions about therapy dog and his training. Patient shared stories about their pets at home with group.   Plan: Continue to engage patient in RT group sessions 2-3x/week.   Victorino Sparrow, LRT/CTRS 01/08/2021 3:32 PM

## 2021-01-08 NOTE — Progress Notes (Addendum)
[facility] MD Progress Note  01/08/2021 12:40 PM Catherine Olsen  MRN:  [id number] Subjective:   Catherine Olsen is a 50 yr old female who presents for SI. PPHx is significant Bipolar disorder, PTSD, and 15 prior hospitalizations.  Yesterday the psychiatry team made the following recommendations:  -Increase Vraylar from 1.5 mg to 3 mg daily   Case was discussed in the multidisciplinary team. MAR was reviewed and Catherine Olsen was compliant with medications.   On interview today Catherine Olsen states that she is doing SOME BETTER. Pt has less pressured speech but continues to complain of OCD symptoms, ptsd symptoms, and anxiety. She reports less frequent and less intense suicidal though over the last 24 hours.    She reports that her sleep is better since not having a roommate. She reports that the dietitian did come and speak with her and had discussed options of how to improve the variety and selection of foods that can be available to her.  She stated she would see what happens at lunch today.  She did note that the dietitian triggered her most OCD because he stated he thought she was being discharged tomorrow, we discussed with her that there were no such plans at this time for discharge tomorrow.   She states that she thinks that the Arman Filter has been helpful in controlling her symptoms of bipolar, in particular her mood and racing thoughts.  However, she reports that her OCD and PTSD are not being controlled and states that specifically her OCD seems to be getting worse.  She reports that she seen several therapists over the years and that they have been only somewhat helpful.  Discussed with her that medications could potentially be helpful in controlling her symptoms.  She reports that she is willing to consider it, however, she still has concerns about interactions with her tamoxifen.  Discussed that we would find out which medications would not interact with tamoxifen by both checking an online database for drug-drug  interactions and that we would also consult the pharmacist here to ensure that no interactions would happen.  Discussed that tomorrow we would present a list of medications that would not have interactions so that she could decide which one she would like to trial.  She states that she has used cocaine and alcohol in the past and a bad coping mechanism for when her OCD/compulsions became too overwhelming.    Principal Problem: Bipolar affective disorder, mixed, severe, with psychotic behavior ([place]) Diagnosis: Principal Problem:   Bipolar affective disorder, mixed, severe, with psychotic behavior ([place]) Active Problems:   Insomnia   Posttraumatic stress disorder   Tobacco use disorder   Chronic dental pain   Cocaine abuse ([place])   Ductal carcinoma in situ (DCIS) of right breast   Suicidal ideation   Past Psychiatric History: Bipolar disorder, PTSD, and 15 prior hospitalizations.  Past Medical History:  Past Medical History:  Diagnosis Date   Abnormal uterine bleeding (AUB) 06/25/2009   Qualifier: Diagnosis of  By: Cathren Laine MD, Ankit     Allergy    Angio-edema    Anxiety    Arthritis    hands, lower back, knees   Asthma    Bipolar 1 disorder (HCC)    Breast cancer ([place])    stage 0   Chronic prescription benzodiazepine use 09/08/2018   COPD (chronic obstructive pulmonary disease) (HCC)    Depression    Endometriosis    Fibromyalgia    GERD (gastroesophageal reflux disease)    Headache(784.0)  otc med prn   Migraine    Pituitary tumor    microadenoma   PONV (postoperative nausea and vomiting)    PTSD (post-traumatic stress disorder)    Scoliosis    Termination of pregnancy    x 2 at age 3 and 50 yrs old   Tobacco abuse 03/04/2015   Urticaria     Past Surgical History:  Procedure Laterality Date   BREAST BIOPSY Right 05/19/2007   BREAST BIOPSY Right 06/02/2007   BREAST BIOPSY  06/29/2019   BREAST EXCISIONAL BIOPSY     BREAST LUMPECTOMY Right 07/21/2019   BREAST  LUMPECTOMY WITH RADIOACTIVE SEED LOCALIZATION Right 07/21/2019   Procedure: RIGHT BREAST LUMPECTOMY WITH RADIOACTIVE SEED LOCALIZATION;  Surgeon: Alphonsa Overall, MD;  Location: [facility];  Service: General;  Laterality: Right;   BREAST SURGERY     DILATION AND CURETTAGE OF UTERUS     FACIAL COSMETIC SURGERY     right cheek   LAPAROSCOPY Right 11/11/2013   Procedure: LAPAROSCOPY OPERATIVE with  Drainage of  RIGHT Ovarian ENDOMETRIOMA;  Surgeon: Elveria Royals, MD;  Location: [facility];  Service: Gynecology;  Laterality: Right;   NASAL ENDOSCOPY     said it showed some acid reflux from ENT   NOSE SURGERY     rhinoplasty at age 39 yrs   35 TOOTH EXTRACTION     Family History:  Family History  Problem Relation Age of Onset   Bipolar disorder Mother    Diabetes Mother    Hypertension Mother    Stroke Mother 32       CVA   Mental illness Mother        no diagnosis; personality disorder   Bipolar disorder Father    Hyperlipidemia Father    Hypertension Father    COPD Father    Alpha-1 antitrypsin deficiency Father    Asthma Father    Alpha-1 antitrypsin deficiency Brother    Asthma Brother    Heart disease Maternal Grandfather    Hyperlipidemia Maternal Grandfather    Hypertension Maternal Grandfather    Asthma Maternal Grandfather    Bipolar disorder Maternal Grandmother    Depression Maternal Grandmother    Diabetes Maternal Grandmother    Heart disease Maternal Grandmother    Hyperlipidemia Maternal Grandmother    Hypertension Maternal Grandmother    Mental illness Maternal Grandmother    Heart disease Paternal Grandfather    Hyperlipidemia Paternal Grandfather    Hypertension Paternal Grandfather    Stroke Paternal Grandfather    Alzheimer's disease Paternal Grandmother    Allergic rhinitis Neg Hx    Angioedema Neg Hx    Eczema Neg Hx    Urticaria Neg Hx    Immunodeficiency Neg Hx    Colon cancer Neg Hx    Esophageal cancer Neg Hx    Family  Psychiatric  History: Maternal great-grandmother- Bipolar with numerous hospitalizations Maternal grandmother-Bipolar Scientist, physiological Social History:  Social History   Substance and Sexual Activity  Alcohol Use Not Currently   Alcohol/week: 2.0 standard drinks   Types: 2 Cans of beer per week   Comment: 6 nights     Social History   Substance and Sexual Activity  Drug Use Not Currently   Types: "Crack" cocaine   Comment: 07/19/2019    Social History   Socioeconomic History   Marital status: Divorced    Spouse name: Not on file   Number of children: 0   Years of education: Not on file  Highest education level: Bachelor's degree (e.g., BA, AB, BS)  Occupational History   Occupation: disability    Comment: mental illness  Tobacco Use   Smoking status: Every Day    Packs/day: 0.50    Years: 29.00    Pack years: 14.50    Types: Cigarettes    Last attempt to quit: 10/08/2019    Years since quitting: 1.2   Smokeless tobacco: Never  Vaping Use   Vaping Use: Former  Substance and Sexual Activity   Alcohol use: Not Currently    Alcohol/week: 2.0 standard drinks    Types: 2 Cans of beer per week    Comment: 6 nights   Drug use: Not Currently    Types: "Crack" cocaine    Comment: 07/19/2019   Sexual activity: Not Currently    Comment: abortion at 17 and 3yrs. of age   Other Topics Concern   Not on file  Social History Narrative   Marital status: divorced; not dating      Children: none      Lives: alone with mom      Employment: disability for mental illness in 92.  Hospitalizations x 9 in past.      Tobacco: 1 ppd x since age 55. Decreasing in 2018.      Alcohol: socially; beers.      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      Exercise: yoga daily; exercise daily.      ADLs: independent with ADLs; no car; depends on others for transportation.      Catherine Olsen is right-handed. She is divorced and lives with her mother. She drinks  2-3 cups of 1/2 caffeine coffe a day. She walks occasionally for exercise.   Social Determinants of Health   Financial Resource Strain: Not on file  Food Insecurity: Not on file  Transportation Needs: Not on file  Physical Activity: Not on file  Stress: Not on file  Social Connections: Not on file   Additional Social History:                         Sleep: Poor  Appetite:   not eating much due to limited Vegan options  Current Medications: Current Facility-Administered Medications  Medication Dose Route Frequency Provider Last Rate Last Admin   alum & mag hydroxide-simeth (MAALOX/MYLANTA) 200-200-20 MG/5ML suspension 30 mL  30 mL Oral Q4H PRN Derrill Center, NP       calcium citrate (CALCITRATE - dosed in mg elemental calcium) tablet 200 mg of elemental calcium  200 mg of elemental calcium Oral Daily Nelda Marseille, Amy E, MD   200 mg of elemental calcium at 01/08/21 1125   cariprazine (VRAYLAR) capsule 3 mg  3 mg Oral Daily Briant Cedar, MD   3 mg at 01/08/21 0802   ibuprofen (ADVIL) tablet 800 mg  800 mg Oral TID PRN Damita Dunnings B, MD   800 mg at 01/08/21 0802   magnesium hydroxide (MILK OF MAGNESIA) suspension 30 mL  30 mL Oral Daily PRN Derrill Center, NP       melatonin tablet 3 mg  3 mg Oral QHS Damita Dunnings B, MD   3 mg at 01/07/21 2137   multivitamin with minerals tablet 1 tablet  1 tablet Oral Daily Damita Dunnings B, MD   1 tablet at 01/08/21 1125   nicotine (NICODERM CQ - dosed in mg/24 hours) patch 21 mg  21 mg Transdermal  Daily Damita Dunnings B, MD       pantoprazole (PROTONIX) EC tablet 40 mg  40 mg Oral Daily Damita Dunnings B, MD       tamoxifen (NOLVADEX) tablet 20 mg  20 mg Oral QHS Nelda Marseille, Amy E, MD   20 mg at 01/07/21 2136   traZODone (DESYREL) tablet 200 mg  200 mg Oral QHS PRN Harlow Asa, MD   200 mg at 01/07/21 2137    Lab Results: No results found for this or any previous visit (from the past 14 hour(s)).  Blood Alcohol level:  Lab  Results  Component Value Date   ETH <10 01/04/2021   ETH <10 16/01/9603    Metabolic Disorder Labs: Lab Results  Component Value Date   HGBA1C 5.2 01/05/2021   MPG 102.54 01/05/2021   MPG 105.41 08/12/2020   Lab Results  Component Value Date   PROLACTIN 8.2 08/12/2020   PROLACTIN 12.0 06/20/2020   Lab Results  Component Value Date   CHOL 155 01/05/2021   TRIG 84 01/05/2021   HDL 62 01/05/2021   CHOLHDL 2.5 01/05/2021   VLDL 17 01/05/2021   LDLCALC 76 01/05/2021   LDLCALC 89 08/12/2020    Physical Findings:  Musculoskeletal: Strength & Muscle Tone: decreased Gait & Station: normal Catherine Olsen leans: N/A  Psychiatric Specialty Exam:  Presentation  General Appearance: Appropriate for Environment; Disheveled  Eye Contact:Fair  Speech:Pressured  Speech Volume:Normal  Handedness:Right   Mood and Affect  Mood:Anxious  Affect:Congruent appearing anxious, full range of affect.    Thought Process  Thought Processes:Coherent  Descriptions of Associations:Intact  Orientation:Full (Time, Place and Person)  Thought Content:Rumination  History of Schizophrenia/Schizoaffective disorder:No  Duration of Psychotic Symptoms:Greater than six months  Hallucinations:Hallucinations: None  Ideas of Reference:Paranoia  Suicidal Thoughts:Suicidal Thoughts: No  Homicidal Thoughts:Homicidal Thoughts: No   Sensorium  Memory:Immediate Good; Recent Good  Judgment:Impaired  Insight:Shallow   Executive Functions  Concentration:Fair  Attention Span:Fair  [place] of Knowledge:Good  Language:Good   Psychomotor Activity  Psychomotor Activity:Psychomotor Activity: Normal   Assets  Assets:Desire for Improvement; Resilience   Sleep  Sleep:Sleep: Fair Number of Hours of Sleep: 5.5    Physical Exam: Physical Exam Vitals and nursing note reviewed.  Constitutional:      General: She is not in acute distress.    Appearance: She is  ill-appearing. She is not toxic-appearing.  HENT:     Head: Normocephalic and atraumatic.  Pulmonary:     Effort: Pulmonary effort is normal.  Musculoskeletal:        General: Normal range of motion.  Neurological:     General: No focal deficit present.     Mental Status: She is alert.   Review of Systems  Respiratory:  Negative for cough and shortness of breath.   Cardiovascular:  Negative for chest pain.  Gastrointestinal:  Negative for abdominal pain, constipation, diarrhea, nausea and vomiting.  Neurological:  Negative for weakness and headaches.  Psychiatric/Behavioral:  Negative for depression, hallucinations and suicidal ideas.   Blood pressure 97/74, pulse 96, temperature 98 F (36.7 C), temperature source Oral, resp. rate 16, height 5\' 8"  (1.727 m), weight 61.7 kg, last menstrual period 11/21/2014, SpO2 99 %. Body mass index is 20.68 kg/m.   Treatment Plan Summary: Daily contact with Catherine Olsen to assess and evaluate symptoms and progress in treatment  Mrs. Catherine Olsen is a 50 yr old female who presents for SI. PPHx is significant Bipolar disorder, PTSD, and 15 prior hospitalizations.  Catherine Olsen seems to have responded to the increase in Vraylar well as she is more organized in her thinking today and has better concentration. She is willing to consider starting medication for her OCD and PTSD.  We will check for interactions with Gabapentin or Remeron and tamoxifen to ensure that there are none.  We will continue to monitor.   Bipolar Disorder: -Continue Vraylar 3 mg daily - since dose increase yesterday, pt has improvement of bipolar symptoms.  -Continue 200 mg QHS   PTSD: -Is willing to consider starting medications tomorrow - considering gabapentin or remeron  -Schedule Outpatient therapy for discharge   OCD by hx -Is willing to consider starting medications tomorrow - considering gabapentin or remeron  -Wishes to resume psychotherapy after discharge    Cluster B traits  (r/o BPD): -Would benefit from DBT after discharge   Cocaine abuse - episodic (r/o stimulant use d/o- cocaine type): -social work for inpatient rehabilitation for substance use disorders     Ductal carcinoma in situ, right breast: -Continue tamoxifen 20 mg nightly -Continue calcium supplementation, calcium citrate 950 mg daily -Continue ibuprofen 800 mg 3 times daily as needed for pain -Continue Protonix 40 mg daily, prophylaxis with use of NSAID   Chronic dental infection -Continue Levaquin 750 mg for 4 doses end date-01/08/2021   -Continue PRN's: Tylenol, Maalox, Atarax, Milk of Magnesia, Trazodone   Briant Cedar, MD 01/08/2021, 12:41 PM

## 2021-01-08 NOTE — Progress Notes (Signed)
Adult Psychoeducational Group Note  Date:  01/08/2021 Time:  10:17 PM  Group Topic/Focus:  Wrap-Up Group:   The focus of this group is to help patients review their daily goal of treatment and discuss progress on daily workbooks.  Participation Level:  Active  Participation Quality:  Appropriate  Affect:  Appropriate  Cognitive:  Appropriate  Insight: Appropriate  Engagement in Group:  Improving  Modes of Intervention:  Discussion  Additional Comments: Pt stated her goal for today was to focus on her treatment plan talk with her Social worker about after care treatment options. Pt stated she accomplished her goals today. Pt stated she talked with her doctor and her social worker about her care today. Pt rated her overall day a 6 out of 10. Pt state she felt happy about how the new medication is working for her. Pt stated she was able to contact her mother today which improved her overall day. Pt stated she felt better about herself tonight. Pt stated she was able to attend all groups held today. Pt stated she was able to attend all meals. Pt stated she took all medications provided today. Pt stated her appetite was fair today. Pt rated her sleep last night was pretty good. Pt stated the goal tonight was to get some rest. Pt stated she had no physical pain today. Pt deny visual hallucinations and auditory issues tonight.  Pt denies thoughts of harming herself or others. Pt stated she would alert staff if anything changed.   Candy Sledge 01/08/2021, 10:17 PM

## 2021-01-08 NOTE — Progress Notes (Signed)
   01/08/21 1000  Psych Admission Type (Psych Patients Only)  Admission Status Voluntary  Psychosocial Assessment  Patient Complaints Irritability;Anxiety  Eye Contact Intense  Facial Expression Anxious;Pensive  Affect Anxious;Irritable;Labile;Preoccupied  Speech Argumentative;Rapid;Pressured  Interaction Assertive;Attention-seeking;Hypervigilant  Motor Activity Hyperactive  Appearance/Hygiene Disheveled  Behavior Characteristics Anxious  Mood Labile  Thought Process  Coherency Concrete thinking;Flight of ideas  Content Blaming others;Compulsions;Obsessions  Delusions Paranoid;Somatic  Perception Hallucinations  Hallucination Auditory;Visual  Judgment Poor  Confusion Mild  Danger to Self  Current suicidal ideation? Denies  Self-Injurious Behavior No self-injurious ideation or behavior indicators observed or expressed   Agreement Not to Harm Self Yes  Description of Agreement Verbal contract  Danger to Others  Danger to Others None reported or observed

## 2021-01-08 NOTE — BHH Group Notes (Signed)
[facility] Group Notes:  (Nursing/MHT/Case Management/Adjunct)  Date:  01/08/2021  Time:  9:00 AM  Type of Therapy:  Group Therapy  Participation Level:  Active  Participation Quality:  Appropriate  Affect:  Appropriate  Cognitive:  Appropriate  Insight:  Appropriate  Engagement in Group:  Engaged  Modes of Intervention:  Discussion  Summary of Progress/Problems:Pt attended group and participated in discussion.  Jerone Cudmore R Kischa Altice 01/08/2021, 9:54 AM

## 2021-01-08 NOTE — Group Note (Signed)
Recreation Therapy Group Note   Group Topic:Stress Management  Group Date: 01/08/2021 Start Time: 0900 End Time: 0950 Facilitators: Victorino Sparrow, LRT/CTRS Location: [address] Dayroom  Goal Area(s) Addresses:  Patient will actively participate in stress management techniques presented during session.  Patient will successfully identify benefit of practicing stress management post d/c.    Group Description:  Meditation. LRT provided education and instruction for meditation. Patient was asked to participate in the technique introduced during session. LRT debriefed on technique used during group session. Patients were given suggestions of ways to access scripts post d/c and encouraged to explore Youtube and other apps available on smartphones, tablets, and computers.   Affect/Mood: Appropriate   Participation Level: Moderate   Participation Quality: Independent   Behavior: Attentive  and Restless   Speech/Thought Process: Distracted   Insight: Good   Judgement: Good   Modes of Intervention: Meditation   Patient Response to Interventions:  Receptive   Education Outcome:  Acknowledges education and In group clarification offered    Clinical Observations/Individualized Feedback: Pt started off attentive and focused during group.  As group went on pt became restless and unable to sit still.  Pt left early and did not return.   Plan: Continue to engage patient in RT group sessions 2-3x/week.   Victorino Sparrow, LRT/CTRS 01/08/2021 11:12 AM

## 2021-01-08 NOTE — BHH Group Notes (Signed)
[facility] Group Notes:  (Nursing/MHT/Case Management/Adjunct)  Date:  01/08/2021  Time:  3:45 pm  Type of Therapy:  Group Therapy  Participation Level:  Active  Participation Quality:  Appropriate  Affect:  Appropriate  Cognitive:  Appropriate  Insight:  Appropriate  Engagement in Group:  Engaged  Modes of Intervention:  Education  Summary of Progress/Problems:Educated patient on mindfulness to reduce anxiety. Encouraged patient to practice mindfulness when she get anxious about the day to day schedule of the facility. Provided client with work sheet to practice with.  Jerrye Beavers 01/08/2021, 3:37 PM

## 2021-01-08 NOTE — Progress Notes (Signed)
Vegan meal accommodations have been made for this patient. Cafeteria staff intend to prepare and offer vegan meal options for remaining duration of patients stay. Unit director and charge RN aware.

## 2021-01-09 DIAGNOSIS — F3164 Bipolar disorder, current episode mixed, severe, with psychotic features: Principal | ICD-10-CM

## 2021-01-09 MED ORDER — ALBUTEROL SULFATE HFA 108 (90 BASE) MCG/ACT IN AERS
2.0000 | INHALATION_SPRAY | RESPIRATORY_TRACT | Status: DC | PRN
  Administered 2021-01-09 (×2): 2 via RESPIRATORY_TRACT

## 2021-01-09 MED ORDER — ALBUTEROL SULFATE HFA 108 (90 BASE) MCG/ACT IN AERS
INHALATION_SPRAY | RESPIRATORY_TRACT | Status: AC
  Filled 2021-01-09: qty 6.7

## 2021-01-09 NOTE — BHH Group Notes (Signed)
Adult Psychoeducational Group Note  Date:  01/09/2021 Time:  9:42 AM  Group Topic/Focus:  Goals Group:   The focus of this group is to help patients establish daily goals to achieve during treatment and discuss how the patient can incorporate goal setting into their daily lives to aide in recovery.  Participation Level:  Active  Participation Quality:  Appropriate  Affect:  Appropriate  Cognitive:  Appropriate  Insight: Appropriate  Engagement in Group:  Engaged  Modes of Intervention:  Discussion  Additional Comments:  Pt was able to participate in group and gave a goal.   Dub Mikes 01/09/2021, 9:42 AM

## 2021-01-09 NOTE — Progress Notes (Signed)
Pt still c/o acid reflux. Given Maalox at about 0616.

## 2021-01-09 NOTE — Group Note (Signed)
[facility] LCSW Group Therapy Note   Group Date: 01/09/2021 Start Time: 1300 End Time: 1345   Type of Therapy/Topic:  Group Therapy:  Balance in Life  Participation Level:  Active   Description of Group:    This group will address the concept of balance and how it feels and looks when one is unbalanced. Patients will be encouraged to process areas in their lives that are out of balance, and identify reasons for remaining unbalanced. Facilitators will guide patients utilizing problem- solving interventions to address and correct the stressor making their life unbalanced. Understanding and applying boundaries will be explored and addressed for obtaining  and maintaining a balanced life. Patients will be encouraged to explore ways to assertively make their unbalanced needs known to significant others in their lives, using other group members and facilitator for support and feedback.  Therapeutic Goals: Patient will identify two or more emotions or situations they have that consume much of in their lives. Patient will identify signs/triggers that life has become out of balance:  Patient will identify two ways to set boundaries in order to achieve balance in their lives:  Patient will demonstrate ability to communicate their needs through discussion and/or role plays    Therapeutic Modalities:   [facility] Assertiveness Training   Mliss Fritz, [place]

## 2021-01-09 NOTE — B[facility] Group Notes (Signed)
Adult Psychoeducational Group Note  Date:  01/09/2021 Time:  5:12 PM  Group Topic/Focus:  Relaxation  Participation Level:  Active  Participation Quality:  Appropriate  Affect:  Appropriate  Cognitive:  Appropriate  Insight: Appropriate  Engagement in Group:  Engaged  Modes of Intervention:  Activity   Dub Mikes 01/09/2021, 5:12 PM

## 2021-01-09 NOTE — Progress Notes (Signed)
Pt says she is still having burning in her throat and asked to take the Maalox. Pt educated that she can have it administered q4 hours if she needs it.

## 2021-01-09 NOTE — BHH Group Notes (Signed)
[place] Group Notes:  (Nursing/MHT/Case Management/Adjunct)  Date:  01/09/2021  Time:  8:28 PM  Type of Therapy:   NA Group  Participation Level:  Active  Participation Quality:  Attentive and Redirectable  Affect:  Anxious and Appropriate  Cognitive:  Alert and Appropriate  Insight:  Improving  Engagement in Group:  Developing/Improving  Modes of Intervention:  Discussion  Summary of Progress/Problems: PT is doing well but tends to have an issue with a lot of things. PT is redirectable and has been social with her peers.   Catherine Olsen 01/09/2021, 8:28 PM

## 2021-01-09 NOTE — Group Note (Signed)
Recreation Therapy Group Note   Group Topic:Healthy Decision Making  Group Date: 01/09/2021 Start Time: 0930 End Time: 1015 Facilitators: Victorino Sparrow, LRT/CTRS Location: [address] Dayroom  Goal Area(s) Addresses:  Patient will effectively work with peer towards shared goal.  Patient will identify factors that guided their decision making.  Patient will pro-socially communicate ideas during group session.   Group Description: Patients were given a scenario that they were going to be stranded on a deserted [place] for several months before being rescued. Writer tasked them with making a list of 15 things they would choose to bring with them for "survival". The list of items was prioritized most important to least. Each patient would come up with their own list, then work together to create a new list of 15 items while in a group of 3-5 peers. LRT discussed each person's list and how it differed from others. The debrief included discussion of priorities, good decisions versus bad decisions, and how it is important to think before acting so we can make the best decision possible. LRT tied the concept of effective communication among group members to patient's support systems outside of the hospital and its benefit post discharge.   Affect/Mood: Appropriate   Participation Level: Engaged   Participation Quality: Independent   Behavior: Appropriate   Speech/Thought Process: Focused   Insight: Good   Judgement: Good   Modes of Intervention: Group work   Patient Response to Interventions:  Attentive and Engaged   Education Outcome:  Acknowledges education and In group clarification offered    Clinical Observations/Individualized Feedback: Pt was bright and engaged.  Pt explained the top five things she would take are a cell phone, charger that runs on solar energy, tent, sleeping bag and pillow.  Pt was then paired with two peers to come up with another list of items.  Pt seemed to  be having an engaging conversation with peers.  Some of the things they came up with were cell phone, seeds, large tent and soap.    Plan: Continue to engage patient in RT group sessions 2-3x/week.   Victorino Sparrow, LRT/CTRS  01/09/2021 10:46 AM

## 2021-01-09 NOTE — Progress Notes (Signed)
Visible in dayroom / milieu majority of this shift. Argumentative with peers and staff about unit routines and is very demanding on interactions "My insurance is paying for all this and if I can't be in the dayroom at this time then I need to talk to a supervisor and have my insurance give y'all partial payment". Reports she slept better last night with good appetite "I feel much better without a room mate and my Arman Filter is working really well for me right now". Tolerates meals and medications well without discomfort. Support, encouragement and reassurance offered to pt this shift. Q 15 minutes safety checks maintained without outburst. Pt continues to need multiple verbal redirections to comply with unit routines and to promote safety in milieu with peers.

## 2021-01-09 NOTE — [facility] Group Notes (Signed)
Adult Psychoeducational Group Note  Date:  01/09/2021 Time:  4:33 PM  Group Topic/Focus:  Personal Choices and Values:   The focus of this group is to help patients assess and explore the importance of values in their lives, how their values affect their decisions, how they express their values and what opposes their expression.  Modes of Intervention:  Education  Additional Comments:  Pt received group materials on personal development, choices and values. They were encouraged to fill it out with staff or independently during reflection time.   Catherine Olsen 01/09/2021, 4:33 PM

## 2021-01-09 NOTE — Progress Notes (Signed)
[facility] MD Progress Note  01/09/2021 2:48 PM Catherine Olsen  MRN:  [id number] Subjective:   Catherine Olsen is a 50 yr old female who presents for SI. PPHx is significant Bipolar disorder, PTSD, and 15 prior hospitalizations.   Yesterday the psychiatry team made the following recommendations:  -CONTINUE Vraylar from 1.5 mg to 3 mg daily   Case was discussed in the multidisciplinary team. MAR was reviewed and patient was compliant with medications. Per SW, 2 programs for outpatient discharge will not accept her - SW will discuss other options with patient today.   On interview today, the pt reports that her mood is improving, becoming less dysphoric. Denies expansive mood. She reports that mood is stable and demonstrates less characteristics of mixed episode. She reports that sleep is better over the last 2 nights, without a roommate,being bothered less and being able to control temp of her room.  Reports that appetite is stable. Denies having suicidal thoughts, which is an improvement.  She reports that anxiety level remains high. We discussed starting gabapentin or mirtazapine, but the pt does not want medication change today and wants to continue vraylar at current dose without change.  Denies HI. Denies AVH.  Reports that OCD symptoms remain elevated and that she does not want medication for this And wants to pursue psychotherapy as intervention for OCD symptoms.  Pt reports she is interested in attending groups. Pt is future oriented.     Principal Problem: Bipolar affective disorder, mixed, severe, with psychotic behavior ([place]) Diagnosis: Principal Problem:   Bipolar affective disorder, mixed, severe, with psychotic behavior ([place]) Active Problems:   Insomnia   Posttraumatic stress disorder   Tobacco use disorder   Chronic dental pain   Cocaine abuse ([place])   Ductal carcinoma in situ (DCIS) of right breast   Suicidal ideation  Total Time spent with patient: 30 minutes  Past Psychiatric  History: see h and p   Past Medical History:  Past Medical History:  Diagnosis Date   Abnormal uterine bleeding (AUB) 06/25/2009   Qualifier: Diagnosis of  By: Catherine Laine MD, Catherine Olsen     Allergy    Angio-edema    Anxiety    Arthritis    hands, lower back, knees   Asthma    Bipolar 1 disorder ([place])    Breast cancer ([place])    stage 0   Chronic prescription benzodiazepine use 09/08/2018   COPD (chronic obstructive pulmonary disease) ([place])    Depression    Endometriosis    Fibromyalgia    GERD (gastroesophageal reflux disease)    Headache(784.0)    otc med prn   Migraine    Pituitary tumor    microadenoma   PONV (postoperative nausea and vomiting)    PTSD (post-traumatic stress disorder)    Scoliosis    Termination of pregnancy    x 2 at age 64 and 50 yrs old   Tobacco abuse 03/04/2015   Urticaria     Past Surgical History:  Procedure Laterality Date   BREAST BIOPSY Right 05/19/2007   BREAST BIOPSY Right 06/02/2007   BREAST BIOPSY  06/29/2019   BREAST EXCISIONAL BIOPSY     BREAST LUMPECTOMY Right 07/21/2019   BREAST LUMPECTOMY WITH RADIOACTIVE SEED LOCALIZATION Right 07/21/2019   Procedure: RIGHT BREAST LUMPECTOMY WITH RADIOACTIVE SEED LOCALIZATION;  Surgeon: Catherine Overall, MD;  Location: [facility];  Service: General;  Laterality: Right;   BREAST SURGERY     DILATION AND CURETTAGE OF UTERUS  FACIAL COSMETIC SURGERY     right cheek   LAPAROSCOPY Right 11/11/2013   Procedure: LAPAROSCOPY OPERATIVE with  Drainage of  RIGHT Ovarian ENDOMETRIOMA;  Surgeon: Elveria Royals, MD;  Location: [facility];  Service: Gynecology;  Laterality: Right;   NASAL ENDOSCOPY     said it showed some acid reflux from ENT   NOSE SURGERY     rhinoplasty at age 26 yrs   41 TOOTH EXTRACTION     Family History:  Family History  Problem Relation Age of Onset   Bipolar disorder Mother    Diabetes Mother    Hypertension Mother    Stroke Mother 33       CVA   Mental illness Mother         no diagnosis; personality disorder   Bipolar disorder Father    Hyperlipidemia Father    Hypertension Father    COPD Father    Alpha-1 antitrypsin deficiency Father    Asthma Father    Alpha-1 antitrypsin deficiency Brother    Asthma Brother    Heart disease Maternal Grandfather    Hyperlipidemia Maternal Grandfather    Hypertension Maternal Grandfather    Asthma Maternal Grandfather    Bipolar disorder Maternal Grandmother    Depression Maternal Grandmother    Diabetes Maternal Grandmother    Heart disease Maternal Grandmother    Hyperlipidemia Maternal Grandmother    Hypertension Maternal Grandmother    Mental illness Maternal Grandmother    Heart disease Paternal Grandfather    Hyperlipidemia Paternal Grandfather    Hypertension Paternal Grandfather    Stroke Paternal Grandfather    Alzheimer's disease Paternal Grandmother    Allergic rhinitis Neg Hx    Angioedema Neg Hx    Eczema Neg Hx    Urticaria Neg Hx    Immunodeficiency Neg Hx    Colon cancer Neg Hx    Esophageal cancer Neg Hx    Family Psychiatric  History: see h and p  Social History:  Social History   Substance and Sexual Activity  Alcohol Use Not Currently   Alcohol/week: 2.0 standard drinks   Types: 2 Cans of beer per week   Comment: 6 nights     Social History   Substance and Sexual Activity  Drug Use Not Currently   Types: "Crack" cocaine   Comment: 07/19/2019    Social History   Socioeconomic History   Marital status: Divorced    Spouse name: Not on file   Number of children: 0   Years of education: Not on file   Highest education level: Bachelor's degree (e.g., BA, AB, BS)  Occupational History   Occupation: disability    Comment: mental illness  Tobacco Use   Smoking status: Every Day    Packs/day: 0.50    Years: 29.00    Pack years: 14.50    Types: Cigarettes    Last attempt to quit: 10/08/2019    Years since quitting: 1.2   Smokeless tobacco: Never  Vaping Use   Vaping  Use: Former  Substance and Sexual Activity   Alcohol use: Not Currently    Alcohol/week: 2.0 standard drinks    Types: 2 Cans of beer per week    Comment: 6 nights   Drug use: Not Currently    Types: "Crack" cocaine    Comment: 07/19/2019   Sexual activity: Not Currently    Comment: abortion at 44 and 79yrs. of age   Other Topics Concern   Not on file  Social History Narrative   Marital status: divorced; not dating      Children: none      Lives: alone with mom      Employment: disability for mental illness in 1997.  Hospitalizations x 9 in past.      Tobacco: 1 ppd x since age 82. Decreasing in 2018.      Alcohol: socially; beers.      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      Exercise: yoga daily; exercise daily.      ADLs: independent with ADLs; no car; depends on others for transportation.      Patient is right-handed. She is divorced and lives with her mother. She drinks 2-3 cups of 1/2 caffeine coffe a day. She walks occasionally for exercise.   Social Determinants of Health   Financial Resource Strain: Not on file  Food Insecurity: Not on file  Transportation Needs: Not on file  Physical Activity: Not on file  Stress: Not on file  Social Connections: Not on file                         Sleep: Fair  Appetite:  Good  Current Medications: Current Facility-Administered Medications  Medication Dose Route Frequency Provider Last Rate Last Admin   albuterol (VENTOLIN HFA) 108 (90 Base) MCG/ACT inhaler 2 puff  2 puff Inhalation Q4H PRN Kahmya Pinkham, Ovid Curd, MD   2 puff at 01/09/21 1023   albuterol (VENTOLIN HFA) 108 (90 Base) MCG/ACT inhaler            alum & mag hydroxide-simeth (MAALOX/MYLANTA) 200-200-20 MG/5ML suspension 30 mL  30 mL Oral Q4H PRN Derrill Center, NP   30 mL at 01/09/21 0616   calcium citrate (CALCITRATE - dosed in mg elemental calcium) tablet 200 mg of elemental calcium  200 mg of elemental calcium Oral Daily Nelda Marseille,  Amy E, MD   200 mg of elemental calcium at 01/09/21 1322   cariprazine (VRAYLAR) capsule 3 mg  3 mg Oral Daily Briant Cedar, MD   3 mg at 01/09/21 0825   ibuprofen (ADVIL) tablet 800 mg  800 mg Oral TID PRN Damita Dunnings B, MD   800 mg at 01/09/21 0814   magnesium hydroxide (MILK OF MAGNESIA) suspension 30 mL  30 mL Oral Daily PRN Derrill Center, NP       melatonin tablet 3 mg  3 mg Oral QHS Damita Dunnings B, MD   3 mg at 01/08/21 2202   multivitamin with minerals tablet 1 tablet  1 tablet Oral Daily Damita Dunnings B, MD   1 tablet at 01/09/21 1323   nicotine (NICODERM CQ - dosed in mg/24 hours) patch 21 mg  21 mg Transdermal Daily McQuilla, Joyce Gross B, MD       pantoprazole (PROTONIX) EC tablet 40 mg  40 mg Oral Daily Damita Dunnings B, MD       tamoxifen (NOLVADEX) tablet 20 mg  20 mg Oral QHS Nelda Marseille, Amy E, MD   20 mg at 01/08/21 2201   traZODone (DESYREL) tablet 200 mg  200 mg Oral QHS PRN Harlow Asa, MD   200 mg at 01/08/21 2202    Lab Results: No results found for this or any previous visit (from the past 68 hour(s)).  Blood Alcohol level:  Lab Results  Component Value Date   ETH <10 01/04/2021   ETH <10 48/18/5631    Metabolic Disorder  Labs: Lab Results  Component Value Date   HGBA1C 5.2 01/05/2021   MPG 102.54 01/05/2021   MPG 105.41 08/12/2020   Lab Results  Component Value Date   PROLACTIN 8.2 08/12/2020   PROLACTIN 12.0 06/20/2020   Lab Results  Component Value Date   CHOL 155 01/05/2021   TRIG 84 01/05/2021   HDL 62 01/05/2021   CHOLHDL 2.5 01/05/2021   VLDL 17 01/05/2021   LDLCALC 76 01/05/2021   LDLCALC 89 08/12/2020    Physical Findings: AIMS: Facial and Oral Movements Muscles of Facial Expression: None, normal Lips and Perioral Area: None, normal Jaw: None, normal Tongue: None, normal,Extremity Movements Upper (arms, wrists, hands, fingers): None, normal Lower (legs, knees, ankles, toes): None, normal, Trunk Movements Neck, shoulders,  hips: None, normal, Olsen Severity Severity of abnormal movements (highest score from questions above): None, normal Incapacitation due to abnormal movements: None, normal Patient's awareness of abnormal movements (rate only patient's report): No Awareness, Dental Status Current problems with teeth and/or dentures?: No Does patient usually wear dentures?: No  CIWA:    COWS:     Musculoskeletal: Strength & Muscle Tone: within normal limits Gait & Station: normal Patient leans: N/A  Psychiatric Specialty Exam:  Presentation  General Appearance: Fairly Groomed (non odorous, hair not burshed, good eye contact, appropriately dressed)  Eye Contact:Good  Speech:Pressured  Speech Volume:Normal  Handedness:Right   Mood and Affect  Mood:Anxious; Labile  Affect:Congruent; Labile   Thought Process  Thought Processes:Coherent  Descriptions of Associations:Circumstantial (less circumstantial, more linear)  Orientation:Full (Time, Place and Person)  Thought Content:Rumination  History of Schizophrenia/Schizoaffective disorder:No  Duration of Psychotic Symptoms:Greater than six months  Hallucinations:Hallucinations: None  Ideas of Reference:None  Suicidal Thoughts:Suicidal Thoughts: No  Homicidal Thoughts:Homicidal Thoughts: No   Sensorium  Memory:Immediate Good; Recent Good; Remote Good  Judgment:Fair  Insight:Fair   Executive Functions  Concentration:Fair (improving)  Attention Span:Fair  [place] of Knowledge:Good  Language:Good   Psychomotor Activity  Psychomotor Activity:Psychomotor Activity: Normal   Assets  Assets:Desire for Improvement; Resilience   Sleep  Sleep:Sleep: Fair Number of Hours of Sleep: 5.5    Physical Exam: Physical Exam Constitutional:      General: She is not in acute distress.    Appearance: She is not ill-appearing.     Comments: thin  HENT:     Nose: No congestion or rhinorrhea.  Pulmonary:      Effort: No respiratory distress.  Neurological:     Mental Status: She is alert.   Review of Systems  Constitutional:  Negative for chills and fever.  Gastrointestinal:  Negative for abdominal pain, constipation, diarrhea and vomiting.  All other systems reviewed and are negative. Blood pressure 92/73, pulse (!) 101, temperature 97.8 F (36.6 C), temperature source Oral, resp. rate 19, height 5\' 8"  (1.727 m), weight 61.7 kg, last menstrual period 11/21/2014, SpO2 99 %. Body mass index is 20.68 kg/m.   Treatment Plan Summary: Daily contact with patient to assess and evaluate symptoms and progress in treatment, Medication management, and Plan    Mrs. Cathy is a 50 yr old female who presents for SI. PPHx is significant Bipolar disorder, PTSD, and 15 prior hospitalizations.     Patient seems to have responded to the increase in Vraylar well as she is more organized in her thinking today and has better concentration. We will continue to monitor.     Bipolar Disorder: -Continue Vraylar 3 mg daily - since dose increase yesterday, pt has improvement of bipolar  symptoms. We discussed dose increase for residual mixed episode symptoms but pt does not want dose change today and choices her choice to stay at current 3 mg once daily dose.      PTSD: -discussed starting remeron today for ptsd and ocd, but pt does not want to start medication now for this and prefers psychotherapy.  -Schedule Outpatient therapy for discharge     OCD by hx -discussed starting remeron today for ptsd and ocd, but pt does not want to start medciation now for this and prefers psychotherapy.  -Wishes to resume psychotherapy after discharge     Cluster B traits (r/o BPD): -Would benefit from DBT after discharge    Cocaine abuse - episodic (r/o stimulant use d/o- cocaine type): -social work for inpatient rehabilitation for substance use disorders      Ductal carcinoma in situ, right breast: -Continue tamoxifen 20  mg nightly -Continue calcium supplementation, calcium citrate 950 mg daily -Continue ibuprofen 800 mg 3 times daily as needed for pain -Continue Protonix 40 mg daily, prophylaxis with use of NSAID     Chronic dental infection -Continue Levaquin 750 mg for 4 doses end date-01/08/2021     -Continue PRN's: Tylenol, Maalox, Atarax, Milk of Magnesia, Trazodone   Total Time Spent in Direct Patient Care:  I personally spent 30 minutes on the unit in direct patient care. The direct patient care time included face-to-face time with the patient, reviewing the patient's chart, communicating with other professionals, and coordinating care. Greater than 50% of this time was spent in counseling or coordinating care with the patient regarding goals of hospitalization, psycho-education, and discharge planning needs.   Janine Limbo, MD Psychiatrist   Christoper Allegra, MD 01/09/2021, 2:48 PM

## 2021-01-10 DIAGNOSIS — F3164 Bipolar disorder, current episode mixed, severe, with psychotic features: Secondary | ICD-10-CM | POA: Diagnosis not present

## 2021-01-10 MED ORDER — NICOTINE 21 MG/24HR TD PT24: 21.0000 mg | MEDICATED_PATCH | Freq: Every day | TRANSDERMAL | 0 refills | Status: DC

## 2021-01-10 MED ORDER — TRAZODONE HCL 100 MG PO TABS: 200.0000 mg | ORAL_TABLET | Freq: Every evening | ORAL | 0 refills | Status: DC | PRN

## 2021-01-10 MED ORDER — CALCIUM CITRATE 950 (200 CA) MG PO TABS: 200.0000 mg | ORAL_TABLET | Freq: Every day | ORAL | 0 refills | Status: DC

## 2021-01-10 MED ORDER — ALBUTEROL SULFATE HFA 108 (90 BASE) MCG/ACT IN AERS: 2.0000 | INHALATION_SPRAY | RESPIRATORY_TRACT | Status: DC | PRN

## 2021-01-10 MED ORDER — CARIPRAZINE HCL 3 MG PO CAPS: 3.0000 mg | ORAL_CAPSULE | Freq: Every day | ORAL | 0 refills | Status: DC

## 2021-01-10 MED ORDER — PANTOPRAZOLE SODIUM 40 MG PO TBEC: 40.0000 mg | DELAYED_RELEASE_TABLET | Freq: Every day | ORAL | 0 refills | Status: DC

## 2021-01-10 MED ORDER — MELATONIN 3 MG PO TABS: 3.0000 mg | ORAL_TABLET | Freq: Every day | ORAL | 0 refills | Status: DC

## 2021-01-10 NOTE — Discharge Summary (Addendum)
Physician Discharge Summary Note  Patient:  Catherine Olsen is an 50 y.o., female MRN:  [id number] DOB:  1970/10/31 Patient phone:  [phone] (home)  Patient address:   [place] [address],  Total Time spent with patient: 45 minutes  Date of Admission:  01/04/2021 Date of Discharge: 01/10/2021  Reason for Admission:   Catherine Olsen is a 50 yr old female who presents for SI. PPHx is significant Bipolar disorder, PTSD, and 15 prior hospitalizations.  Prior to admission, she was supposed to have a breast biopsy last Wednesday.  She states that she had a "breakdown" and was unable to have the biopsy done.  She began becoming overwhelmed with thoughts of having little social support and grief.  Later in the week she began having thoughts of SI with a plan to overdose on trazodone and so she called the mobile crisis unit.  Mom crisis unit recommended she go to the ED for evaluation and she was admitted here.   Hospital Course:   During hospitalization, patient was evaluated by the psychiatric team.  They received multiple disciplinary care.  After initial intake evaluation, it was decided to adjust medications. She was started on Vraylar and titrated up. Vyralar was specifically chosen as a mood stabilizer as it affects prolactin less and does not interact with tamoxifen. The pt has multiple failed trials of other antipsychotics and traditional mood stabilizers. Her psychiatric symptoms well to the vraylar. Even though she was also suffering from PTSD and OCD she was not interested in starting any other medications as she was concerned over the potential interactions with her Tamoxifen and potential side effects. Patient not having side effects to psychiatric medications.   Patient received supportive psychotherapy and was encouraged to participate in group therapy during the hospitalization. Patient denied having suicidal thoughts more than 48 hours prior to discharge.   On day  of discharge patient reported her mood was good.  She reported that she was sleeping well.  She reported that she was eating well.  She reported no SI, HI, or AVH.  Discussed with her that she appeared much more stable than when she was admitted.  She reported that she agreed with this and stated that her thinking was much clearer.  She did report that she had thought about adding another medication like gabapentin as had been discussed earlier during the admission.  Discussed with her that this would be something discuss with her outpatient provider should she still want to trial this medication.  Discussed with her that its fairly safe and does not interfere with other medications.  She did ask if they could be daily dosing but discussed with her that because it is renally cleared it has a very short half-life and so it is dosed usually 3 times a day or at a minimum twice a day.  She reports that she is looking forward to her PHP starting tomorrow.  She was future thinking that she currently has a psychiatrist she likes but was inquiring should this psychiatrist leave if she can continue here.  Discussed with her that resident clinics would be starting at the [facility] next year and that this would be an option for her.  She said she would keep this in mind.  She stated that she did have some minor dizziness but no falls.  Discussed with her to continue to monitor this and make sure this was addressed with her outpatient provider if it did not go away.  She had no other concerns at present.  She was discharged home.  Principal Problem: Bipolar affective disorder, mixed, severe, with psychotic behavior ([place]) Discharge Diagnoses: Principal Problem:   Bipolar affective disorder, mixed, severe, with psychotic behavior ([place]) Active Problems:   Insomnia   Posttraumatic stress disorder   Tobacco use disorder   Chronic dental pain   Cocaine abuse ([place])   Ductal carcinoma in situ (DCIS) of right breast    Suicidal ideation   Past Psychiatric History: Bipolar disorder, PTSD, and 15 prior hospitalizations  Past Medical History:  Past Medical History:  Diagnosis Date   Abnormal uterine bleeding (AUB) 06/25/2009   Qualifier: Diagnosis of  By: Cathren Laine MD, Ankit     Allergy    Angio-edema    Anxiety    Arthritis    hands, lower back, knees   Asthma    Bipolar 1 disorder ([place])    Breast cancer ([place])    stage 0   Chronic prescription benzodiazepine use 09/08/2018   COPD (chronic obstructive pulmonary disease) ([place])    Depression    Endometriosis    Fibromyalgia    GERD (gastroesophageal reflux disease)    Headache(784.0)    otc med prn   Migraine    Pituitary tumor    microadenoma   PONV (postoperative nausea and vomiting)    PTSD (post-traumatic stress disorder)    Scoliosis    Termination of pregnancy    x 2 at age 27 and 50 yrs old   Tobacco abuse 03/04/2015   Urticaria     Past Surgical History:  Procedure Laterality Date   BREAST BIOPSY Right 05/19/2007   BREAST BIOPSY Right 06/02/2007   BREAST BIOPSY  06/29/2019   BREAST EXCISIONAL BIOPSY     BREAST LUMPECTOMY Right 07/21/2019   BREAST LUMPECTOMY WITH RADIOACTIVE SEED LOCALIZATION Right 07/21/2019   Procedure: RIGHT BREAST LUMPECTOMY WITH RADIOACTIVE SEED LOCALIZATION;  Surgeon: Alphonsa Overall, MD;  Location: [facility];  Service: General;  Laterality: Right;   BREAST SURGERY     DILATION AND CURETTAGE OF UTERUS     FACIAL COSMETIC SURGERY     right cheek   LAPAROSCOPY Right 11/11/2013   Procedure: LAPAROSCOPY OPERATIVE with  Drainage of  RIGHT Ovarian ENDOMETRIOMA;  Surgeon: Elveria Royals, MD;  Location: [facility];  Service: Gynecology;  Laterality: Right;   NASAL ENDOSCOPY     said it showed some acid reflux from ENT   NOSE SURGERY     rhinoplasty at age 49 yrs   24 TOOTH EXTRACTION     Family History:  Family History  Problem Relation Age of Onset   Bipolar disorder Mother    Diabetes Mother     Hypertension Mother    Stroke Mother 70       CVA   Mental illness Mother        no diagnosis; personality disorder   Bipolar disorder Father    Hyperlipidemia Father    Hypertension Father    COPD Father    Alpha-1 antitrypsin deficiency Father    Asthma Father    Alpha-1 antitrypsin deficiency Brother    Asthma Brother    Heart disease Maternal Grandfather    Hyperlipidemia Maternal Grandfather    Hypertension Maternal Grandfather    Asthma Maternal Grandfather    Bipolar disorder Maternal Grandmother    Depression Maternal Grandmother    Diabetes Maternal Grandmother    Heart disease Maternal Grandmother    Hyperlipidemia Maternal Grandmother  Hypertension Maternal Grandmother    Mental illness Maternal Grandmother    Heart disease Paternal Grandfather    Hyperlipidemia Paternal Grandfather    Hypertension Paternal Grandfather    Stroke Paternal Grandfather    Alzheimer's disease Paternal Grandmother    Allergic rhinitis Neg Hx    Angioedema Neg Hx    Eczema Neg Hx    Urticaria Neg Hx    Immunodeficiency Neg Hx    Colon cancer Neg Hx    Esophageal cancer Neg Hx    Family Psychiatric  History: Maternal great-grandmother- Bipolar with numerous hospitalizations Maternal grandmother-Bipolar Scientist, physiological Social History:  Social History   Substance and Sexual Activity  Alcohol Use Not Currently   Alcohol/week: 2.0 standard drinks   Types: 2 Cans of beer per week   Comment: 6 nights     Social History   Substance and Sexual Activity  Drug Use Not Currently   Types: "Crack" cocaine   Comment: 07/19/2019    Social History   Socioeconomic History   Marital status: Divorced    Spouse name: Not on file   Number of children: 0   Years of education: Not on file   Highest education level: Bachelor's degree (e.g., BA, AB, BS)  Occupational History   Occupation: disability    Comment: mental illness  Tobacco Use   Smoking status: Every Day     Packs/day: 0.50    Years: 29.00    Pack years: 14.50    Types: Cigarettes    Last attempt to quit: 10/08/2019    Years since quitting: 1.2   Smokeless tobacco: Never  Vaping Use   Vaping Use: Former  Substance and Sexual Activity   Alcohol use: Not Currently    Alcohol/week: 2.0 standard drinks    Types: 2 Cans of beer per week    Comment: 6 nights   Drug use: Not Currently    Types: "Crack" cocaine    Comment: 07/19/2019   Sexual activity: Not Currently    Comment: abortion at 89 and 15yrs. of age   Other Topics Concern   Not on file  Social History Narrative   Marital status: divorced; not dating      Children: none      Lives: alone with mom      Employment: disability for mental illness in 86.  Hospitalizations x 9 in past.      Tobacco: 1 ppd x since age 66. Decreasing in 2018.      Alcohol: socially; beers.      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      Exercise: yoga daily; exercise daily.      ADLs: independent with ADLs; no car; depends on others for transportation.      Patient is right-handed. She is divorced and lives with her mother. She drinks 2-3 cups of 1/2 caffeine coffe a day. She walks occasionally for exercise.   Social Determinants of Health   Financial Resource Strain: Not on file  Food Insecurity: Not on file  Transportation Needs: Not on file  Physical Activity: Not on file  Stress: Not on file  Social Connections: Not on file    Physical Findings:   01/10/21 1041  Facial and Oral Movements  Muscles of Facial Expression 0  Lips and Perioral Area 0  Jaw 0  Tongue 0  Extremity Movements  Upper (arms, wrists, hands, fingers) 0  Lower (legs, knees, ankles, toes) 0  Trunk Movements  Neck, shoulders, hips 0  Overall Severity  Severity of abnormal movements (highest score from questions above) 0  Incapacitation due to abnormal movements 0  Patient's awareness of abnormal movements (rate only patient's report) 0   Dental Status  Current problems with teeth and/or dentures? No  Does patient usually wear dentures? No  AIMS Total Score  AIMS Total Score 0     Musculoskeletal: Strength & Muscle Tone: within normal limits Gait & Station: normal Patient leans: N/A   Psychiatric Specialty Exam:  Presentation  General Appearance: Appropriate for Environment; Casual; Fairly Groomed  Eye Contact:Good  Speech:Clear and Coherent (mildly rapid speech but improved from admission and not rapid)  Speech Volume:Normal  Handedness:Right   Mood and Affect  Mood:Anxious; Euthymic  Affect:Congruent   Thought Process  Thought Processes:Goal Directed; Coherent  Descriptions of Associations:Intact  Orientation:Full (Time, Place and Person)  Thought Content:Logical; Obsessions  History of Schizophrenia/Schizoaffective disorder:No  Duration of Psychotic Symptoms:Greater than six months  Hallucinations:Hallucinations: None  Ideas of Reference:None  Suicidal Thoughts:Suicidal Thoughts: No  Homicidal Thoughts:Homicidal Thoughts: No   Sensorium  Memory:Immediate Good; Recent Good  Judgment:Good  Insight:Good   Executive Functions  Concentration:Good  Attention Span:Good  [place] of Knowledge:Good  Language:Good   Psychomotor Activity  Psychomotor Activity:Psychomotor Activity: Normal AIMS Completed?: Yes   Assets  Assets:Desire for Improvement; Resilience   Sleep  Sleep:Sleep: Good Number of Hours of Sleep: 6    Physical Exam: Physical Exam Vitals and nursing note reviewed.  Constitutional:      General: She is not in acute distress.    Appearance: She is normal weight. She is not ill-appearing or toxic-appearing.  HENT:     Head: Normocephalic and atraumatic.  Pulmonary:     Effort: Pulmonary effort is normal.  Musculoskeletal:        General: Normal range of motion.  Neurological:     General: No focal deficit present.     Mental Status:  She is alert.   Review of Systems  Respiratory:  Negative for cough and shortness of breath.   Cardiovascular:  Negative for chest pain.  Gastrointestinal:  Negative for abdominal pain, constipation, diarrhea, nausea and vomiting.  Neurological:  Positive for dizziness (mild). Negative for weakness and headaches.  Psychiatric/Behavioral:  Negative for depression, hallucinations and suicidal ideas.   Blood pressure 106/62, pulse 86, temperature 98.4 F (36.9 C), temperature source Oral, resp. rate 19, height 5\' 8"  (1.727 m), weight 61.7 kg, last menstrual period 11/21/2014, SpO2 100 %. Body mass index is 20.68 kg/m.   Social History   Tobacco Use  Smoking Status Every Day   Packs/day: 0.50   Years: 29.00   Pack years: 14.50   Types: Cigarettes   Last attempt to quit: 10/08/2019   Years since quitting: 1.2  Smokeless Tobacco Never   Tobacco Cessation:  A prescription for an FDA-approved tobacco cessation medication was offered at discharge and the patient refused   Blood Alcohol level:  Lab Results  Component Value Date   [facility] <10 01/04/2021   ETH <10 09/47/0962    Metabolic Disorder Labs:  Lab Results  Component Value Date   HGBA1C 5.2 01/05/2021   MPG 102.54 01/05/2021   MPG 105.41 08/12/2020   Lab Results  Component Value Date   PROLACTIN 8.2 08/12/2020   PROLACTIN 12.0 06/20/2020   Lab Results  Component Value Date   CHOL 155 01/05/2021   TRIG 84 01/05/2021   HDL 62 01/05/2021   CHOLHDL 2.5  01/05/2021   VLDL 17 01/05/2021   LDLCALC 76 01/05/2021   LDLCALC 89 08/12/2020    See Psychiatric Specialty Exam and Suicide Risk Assessment completed by Attending Physician prior to discharge.  Discharge destination:  Home  Is patient on multiple antipsychotic therapies at discharge:  No   Has Patient had three or more failed trials of antipsychotic monotherapy by history:  No  Recommended Plan for Multiple Antipsychotic Therapies: NA   Prescriptions given at  discharge. Patient agreeable to plan. Given opportunity to ask questions. Appears to feel comfortable with discharge denies any current suicidal or homicidal thought.  Patient is also instructed prior to discharge to: Take all medications as prescribed by mental healthcare provider. Report any adverse effects and or reactions from the medicines to outpatient provider promptly. Patient has been instructed & cautioned: To not engage in alcohol and or illegal drug use while on prescription medicines. In the event of worsening symptoms,  patient is instructed to call the crisis hotline, 911 and or go to the nearest ED for appropriate evaluation and treatment of symptoms. To follow-up with primary care provider for other medical issues, concerns and or health care needs  The patient was evaluated each day by a clinical provider to ascertain response to treatment. Improvement was noted by the patient's report of decreasing symptoms, improved sleep and appetite, affect, medication tolerance, behavior, and participation in unit programming.  Patient was asked each day to complete a self inventory noting mood, mental status, pain, new symptoms, anxiety and concerns.  Patient responded well to medication and being in a therapeutic and supportive environment. Positive and appropriate behavior was noted and the patient was motivated for recovery. The patient worked closely with the treatment team and case manager to develop a discharge plan with appropriate goals. Coping skills, problem solving as well as relaxation therapies were also part of the unit programming.  By the day of discharge patient was in much improved condition than upon admission.  Symptoms were reported as significantly decreased or resolved completely. The patient denied SI/HI and voiced no AVH. The patient was motivated to continue taking medication with a goal of continued improvement in mental health.   Patient was discharged home with a plan to  follow up as noted below.   Allergies as of 01/10/2021       Reactions   Other Anaphylaxis   Idiopathic (possible binder or preservatives in medication)   Prednisone Anaphylaxis   Steroids Steroids ( can take with benadryl )   Can not take higher than 40 mg   Sulfamethoxazole Anaphylaxis   Oxycodone Other (See Comments)   Severe blindness?   Abilify [aripiprazole]    Generic Abilify--causes severe neurological problems. Can use name brand   Celebrex [celecoxib]    Molds & Smuts Other (See Comments)   Environmental   Penicillins Hives   Has patient had a PCN reaction causing immediate rash, facial/tongue/throat swelling, SOB or lightheadedness with hypotension: no Has patient had a PCN reaction causing severe rash involving mucus membranes or skin necrosis: NO Has patient had a PCN reaction that required hospitalizationNO Has patient had a PCN reaction occurring within the last 10 years:NO If all of the above answers are "NO", then may proceed with Cephalosporin use.   Tylenol [acetaminophen]    Stomach pain   Lamictal [lamotrigine] Rash   Percocet [oxycodone-acetaminophen]    Vision loss   Sulfa Antibiotics Hives   Pt states she is not really allergic to sulfa, but to  sulfites.   Sulfites Other (See Comments)   unknown        Medication List     STOP taking these medications    levofloxacin 750 MG tablet Commonly known as: LEVAQUIN       TAKE these medications      Indication  albuterol 108 (90 Base) MCG/ACT inhaler Commonly known as: VENTOLIN HFA Inhale 2 puffs into the lungs every 4 (four) hours as needed for wheezing or shortness of breath.  Indication: Chronic Obstructive Lung Disease   calcium citrate 950 (200 Ca) MG tablet Commonly known as: CALCITRATE - dosed in mg elemental calcium Take 1 tablet (200 mg of elemental calcium total) by mouth daily. For bone health  Indication: Low Amount of Calcium in the Blood   cariprazine 3 MG capsule Commonly  known as: VRAYLAR Take 1 capsule (3 mg total) by mouth daily. For mood control Start taking on: January 11, 2021 What changed:  medication strength how much to take additional instructions  Indication: Mood control   EPINEPHrine 0.3 mg/0.3 mL Soaj injection Commonly known as: EPI-PEN Inject 0.3 mg into the skin as needed for anaphylaxis.  Indication: Life-Threatening Hypersensitivity Reaction   ibuprofen 800 MG tablet Commonly known as: ADVIL Take 800 mg by mouth every 8 (eight) hours as needed for mild pain.  Indication: Pain   melatonin 3 MG Tabs tablet Take 1 tablet (3 mg total) by mouth at bedtime. For sleep  Indication: Trouble Sleeping   nicotine 21 mg/24hr patch Commonly known as: NICODERM CQ - dosed in mg/24 hours Place 1 patch (21 mg total) onto the skin daily. (May buy from over the counter): For smoking cessation Start taking on: January 11, 2021  Indication: Nicotine Addiction   pantoprazole 40 MG tablet Commonly known as: PROTONIX Take 1 tablet (40 mg total) by mouth daily. For acid reflux Start taking on: January 11, 2021  Indication: Gastroesophageal Reflux Disease   tamoxifen 20 MG tablet Commonly known as: NOLVADEX Take 1 tablet (20 mg total) by mouth daily. For breast cancer What changed: when to take this  Indication: Breast cancer   traZODone 100 MG tablet Commonly known as: DESYREL Take 2 tablets (200 mg total) by mouth at bedtime as needed for sleep. What changed:  how much to take when to take this  Indication: [place]. Go on 01/18/2021.   Why: You have an appointment for medication management services on 01/18/21 at 1:30 pm.  This appointment will be held in person. Contact information: [place] [address] [phone]         Nicholas Lose, MD. Call on 01/14/2021.   Specialty: Hematology and Oncology Why: Please call to confirm your  appointment on 01/14/21 at 11:30 am as we have been unable to contact. Contact information: [place] [address] [place], [facility].   Specialty: Behavioral Health Why: You have an intake appointment on Friday, 01/11/2021 at 10am in the [facility]. Please bring ID and Insurance card to this appointment. Once intake is complete you will follow up in the outpatient building until 3:30pm. After initial appointment virtual appointments will be an option for continued treatment. Contact information: [place] [address] [phone]  Follow-up recommendations:   - Activity as tolerated. - Diet as recommended by PCP. - Keep all scheduled follow-up appointments as recommended.  Comments:  Patient is instructed to take all prescribed medications as recommended. Report any side effects or adverse reactions to your outpatient psychiatrist. Patient is instructed to abstain from alcohol and illegal drugs while on prescription medications. In the event of worsening symptoms, patient is instructed to call the crisis hotline, 911, or go to the nearest emergency department for evaluation and treatment.  Signed: Briant Cedar, MD 01/10/2021, 2:17 PM

## 2021-01-10 NOTE — Plan of Care (Signed)
Patient was able to identify three positive coping skills at completion of recreation therapy group sessions.   Victorino Sparrow, LRT/CTRS

## 2021-01-10 NOTE — BHH Suicide Risk Assessment (Signed)
[facility] Discharge Suicide Risk Assessment   Principal Problem: Bipolar affective disorder, mixed, severe, with psychotic behavior ([place]) Discharge Diagnoses: Principal Problem:   Bipolar affective disorder, mixed, severe, with psychotic behavior ([place]) Active Problems:   Insomnia   Posttraumatic stress disorder   Tobacco use disorder   Chronic dental pain   Cocaine abuse ([place])   Ductal carcinoma in situ (DCIS) of right breast   Suicidal ideation   Total Time spent with patient: 20 minutes  Subjective: Catherine Olsen is a 50 yr old female with a past psychiatric history of Bipolar disorder, PTSD, and 15 prior hospitalizations., was admitted to this psychiatric unit for evaluation and treatment of suicidal thoughts.  Patient also has breast cancer, for which she is taking tamoxifen. Patient, Catherine Olsen was started, that this was a mood stabilizer of choice for the patient prefers prolactin sparing mood stabilizer, that does not interact with tamoxifen.  Patient reports multiple failed trials of other antipsychotics and traditional mood stabilizers.  Patient reports that starting an increasing Vraylar to a current dose of 3 mg/day, has been helpful for stabilizing mood, treating mixed episode, improving sleep, helping with concentration, and helping with anxiety symptoms.  Patient reports no from this medication plan to continue.  She reports some dizziness, but denies falls, and patient will continue to monitor this.  We also discussed starting either mirtazapine or gabapentin, for anxiety, and OCD, and PTSD symptoms, the patient chose to start other medications during this current hospitalization.  Patient reports benefit from psychiatric hospitalization.  Patient denies having suicidal thoughts for more than 48 hours prior to discharge.  Recommend patient follow with outpatient psychiatric provider and continue medications that she takes in the hospital.   Musculoskeletal: Strength & Muscle Tone: within  normal limits Gait & Station: normal Patient leans: N/A  Psychiatric Specialty Exam  Presentation  General Appearance: Appropriate for Environment; Casual; Fairly Groomed  Eye Contact:Good  Speech:-- (increased rate, but slower since admission. not pressured. able to be interrupted.)  Speech Volume:Normal  Handedness:Right   Mood and Affect  Mood:Anxious; Euthymic  Duration of Depression Symptoms: Greater than two weeks  Affect:Congruent; Full Range   Thought Process  Thought Processes:Goal Directed; Linear  Descriptions of Associations:Intact  Orientation:Full (Time, Place and Person)  Thought Content:Logical; Obsessions  History of Schizophrenia/Schizoaffective disorder:No  Duration of Psychotic Symptoms:Greater than six months  Hallucinations:Hallucinations: None  Ideas of Reference:None  Suicidal Thoughts:Suicidal Thoughts: No  Homicidal Thoughts:Homicidal Thoughts: No   Sensorium  Memory:Immediate Good; Recent Good; Remote Good  Judgment:Good  Insight:Good   Executive Functions  Concentration:Good  Attention Span:Good  [place] of Knowledge:Good  Language:Good   Psychomotor Activity  Psychomotor Activity:Psychomotor Activity: Normal   Assets  Assets:Desire for Improvement; Resilience   Sleep  Sleep:Sleep: Good   Physical Exam: Physical Exam Constitutional:      General: She is not in acute distress.    Appearance: Normal appearance.  HENT:     Nose: No congestion or rhinorrhea.  Pulmonary:     Effort: No respiratory distress.  Neurological:     Mental Status: She is alert.     Motor: No weakness.     Coordination: Coordination normal.     Gait: Gait normal.  Psychiatric:        Mood and Affect: Mood normal.        Behavior: Behavior normal.        Thought Content: Thought content normal.        Judgment: Judgment normal.  Review of Systems  Constitutional:        Headache  Cardiovascular:  Negative  for chest pain and palpitations.  Gastrointestinal:  Negative for abdominal pain, diarrhea, nausea and vomiting.  Neurological:  Positive for dizziness.  Psychiatric/Behavioral:  Negative for depression, hallucinations and suicidal ideas. The patient is nervous/anxious. The patient does not have insomnia.   All other systems reviewed and are negative. Blood pressure (!) 90/58, pulse 93, temperature (!) 97.5 F (36.4 C), temperature source Oral, resp. rate 19, height 5\' 8"  (1.727 m), weight 61.7 kg, last menstrual period 11/21/2014, SpO2 100 %. Body mass index is 20.68 kg/m.  Mental Status Per Nursing Assessment::   On Admission:  Self-harm thoughts  Demographic Factors:  Caucasian and Unemployed  Loss Factors: Decline in physical health and Financial problems/change in socioeconomic status  Historical Factors: Family history of mental illness or substance abuse and Impulsivity  Risk Reduction Factors:   Sense of responsibility to family, Living with another person, especially a relative, Positive social support, Positive therapeutic relationship, and Positive coping skills or problem solving skills  Continued Clinical Symptoms:  Bipolar Disorder:   Mixed State  Cognitive Features That Contribute To Risk:  None    Suicide Risk:  Mild:   There are no identifiable suicide plans, no associated intent, mild dysphoria and related symptoms, good self-control (both objective and subjective assessment), few other risk factors, and identifiable protective factors, including available and accessible social support.   Follow-up Information     [facility]. Go on 01/18/2021.   Why: You have an appointment for medication management services on 01/18/21 at 1:30 pm.  This appointment will be held in person. Contact information: [place] [address] [phone]         Nicholas Lose, MD. Call on 01/14/2021.   Specialty: Hematology and Oncology Why:  Please call to confirm your appointment on 01/14/21 at 11:30 am as we have been unable to contact. Contact information: [place] [phone] [place], [facility].   Specialty: Behavioral Health Why: You have an intake appointment on Friday, 01/11/2021 at 10am in the [facility]. Please bring ID and Insurance card to this appointment. Once intake is complete you will follow up in the outpatient building until 3:30pm. After initial appointment virtual appointments will be an option for continued treatment. Contact information: [place] [address] [phone]                 Plan Of Care/Follow-up recommendations:  Activity: As tolerated Diet: Heart healthy Other: Please follow up with outpatient psychiatrist.  Please continue psychiatric medications as taken at the time of discharge.  Please follow up with outpatient primary care doctor and oncologist. Please follow up with PHP.  If suicidal thoughts return, call 911, call [phone], call your psychiatrist, or go to the nearest emergency department.  Total Time Spent in Direct Patient Care:  I personally spent 45 minutes on the unit in direct patient care. The direct patient care time included face-to-face time with the patient, reviewing the patient's chart, communicating with other professionals, and coordinating care. Greater than 50% of this time was spent in counseling or coordinating care with the patient regarding goals of hospitalization, psycho-education, and discharge planning needs.   Janine Limbo, MD Psychiatrist     Loney Loh Matsue Strom, MD 01/10/2021, 10:24 AM

## 2021-01-10 NOTE — BHH Group Notes (Signed)
Did not attend goals group 
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
## 2021-01-10 NOTE — Plan of Care (Signed)
  Problem: Nutrition: Goal: Adequate nutrition will be maintained Outcome: Progressing   Problem: Coping: Goal: Level of anxiety will decrease Outcome: Progressing   Problem: Elimination: Goal: Will not experience complications related to bowel motility Outcome: Progressing   

## 2021-01-10 NOTE — Progress Notes (Signed)
     01/10/21 0609  Vital Signs  Temp (!) 97.5 F (36.4 C)  Temp Source Oral  Pulse Rate 74  Pulse Rate Source Monitor  BP (!) 83/67  BP Location Left Arm  BP Method Automatic  Patient Position (if appropriate) Sitting  Oxygen Therapy  SpO2 99 %     01/10/21 0611  Vital Signs  Pulse Rate 93  Pulse Rate Source Monitor  BP (!) 90/58  BP Location Left Arm  BP Method Automatic  Patient Position (if appropriate) Standing  Oxygen Therapy  SpO2 100 %    Hydrated patient with Gatorade.  Patient was not willing to cooperate. Pt accepted but stated she can't promise she will drink it.

## 2021-01-10 NOTE — Progress Notes (Signed)
D: Pt A & O X 4. Denies SI, HI, AVH and pain at this time. D/C home as ordered. Picked up in lobby by "My mother". A: D/C instructions reviewed with pt including electronic prescriptions and follow up appointments; compliance encouraged. All belongings from assigned locker returned to pt at time of departure. Scheduled and PRN medications given with verbal education and effects monitored. Safety checks maintained without incident till time of d/c.  R: Pt receptive to care. Compliant with medications when offered. Denies adverse drug reactions when assessed. Verbalized understanding related to d/c instructions. Signed belonging sheet in agreement with items received from locker. Ambulatory with a steady gait. Appears to be in no physical distress at time of departure.

## 2021-01-10 NOTE — Progress Notes (Signed)
Recreation Therapy Notes  INPATIENT RECREATION TR PLAN  Patient Details Name: NYSHA KOPLIN MRN: [id number] DOB: Jun 22, 1970 Today's Date: 01/10/2021  Rec Therapy Plan Is patient appropriate for Therapeutic Recreation?: Yes Treatment times per week: about 3 days Estimated Length of Stay: 5-7 days TR Treatment/Interventions: Group participation (Comment)  Discharge Criteria Pt will be discharged from therapy if:: Discharged Treatment plan/goals/alternatives discussed and agreed upon by:: Patient/family  Discharge Summary Short term goals set: See patient care plan Short term goals met: Complete Progress toward goals comments: Groups attended Which groups?: AAA/T, Coping skills, Stress management, Other (Comment) (Decision Making) Reason goals not met: None Therapeutic equipment acquired: N/A Reason patient discharged from therapy: Discharge from hospital Pt/family agrees with progress & goals achieved: Yes Date patient discharged from therapy: 01/10/21    Victorino Sparrow, LRT/CTRS Ria Comment, Stephaney Steven A 01/10/2021, 12:25 PM

## 2021-01-10 NOTE — Progress Notes (Signed)
  [facility] Adult Case Management Discharge Plan :  Will you be returning to the same living situation after discharge:  Yes,  home with mother At discharge, do you have transportation home?: Yes,  mother to pick this pt up Do you have the ability to pay for your medications: Yes,  has insurance  Release of information consent forms completed and in the chart;  Patient's signature needed at discharge.  Patient to Follow up at:  Follow-up Information     [facility]. Go on 01/18/2021.   Why: You have an appointment for medication management services on 01/18/21 at 1:30 pm.  This appointment will be held in person. Contact information: [place] [address] [phone]         Nicholas Lose, MD. Call on 01/14/2021.   Specialty: Hematology and Oncology Why: Please call to confirm your appointment on 01/14/21 at 11:30 am as we have been unable to contact. Contact information: [place] [phone] [place], [facility].   Specialty: Behavioral Health Why: You have an intake appointment on Friday, 01/11/2021 at 10am in the [facility]. Please bring ID and Insurance card to this appointment. Once intake is complete you will follow up in the outpatient building until 3:30pm. After initial appointment virtual appointments will be an option for continued treatment. Contact information: [place] [address] [phone]                 Next level of care provider has access to [place] and Suicide Prevention discussed: Yes,  with mother     Has patient been referred to the Quitline?: Patient refused referral  Patient has been referred for addiction treatment: Pt. refused referral  Vassie Moselle, [place] 01/10/2021, 9:33 AM

## 2021-01-11 DIAGNOSIS — F319 Bipolar disorder, unspecified: Secondary | ICD-10-CM | POA: Diagnosis not present

## 2021-01-12 NOTE — BHH Group Notes (Signed)
ADULT GRIEF GROUP NOTE:   Spiritual care group on grief and loss facilitated by chaplain Janne Napoleon, [facility]   Group Goal:   Support / Education around grief and loss   Members engage in facilitated group support and psycho-social education.   Group Description:   Following introductions and group rules, group members engaged in facilitated group dialog and support around topic of loss, with particular support around experiences of loss in their lives. Group Identified types of loss (relationships / self / things) and identified patterns, circumstances, and changes that precipitate losses. Reflected on thoughts / feelings around loss, normalized grief responses, and recognized variety in grief experience. Group noted Worden's four tasks of grief in discussion.   Group drew on Adlerian / Rogerian, narrative, MI,   Patient Progress: [place] attended group and showed active engagement and participation.  [employer], bcc Pager, [phone] 9:52 PM

## 2021-01-12 NOTE — Progress Notes (Signed)
Patient Care Team: Elsie Stain, MD as PCP - General (Pulmonary Disease) Services, [facility] as Referring Physician Christen Butter, MD as Referring Physician (Gynecology) Melida Quitter, MD as Consulting Physician (Otolaryngology) Loretta Plume as Consulting Physician (Neurosurgery)  DIAGNOSIS:    ICD-10-CM   1. Ductal carcinoma in situ (DCIS) of right breast  D05.11       SUMMARY OF ONCOLOGIC HISTORY: Oncology History  Ductal carcinoma in situ (DCIS) of right breast  06/29/2019 Initial Diagnosis   Right breast biopsy: Fibrocystic changes, pseudoangiomatous stromal hyperplasia   07/21/2019 Surgery   Right lumpectomy: Dr. Lucia Gaskins: Intermediate grade DCIS 0.2 cm, 0.1 cm from anterior margin, LCIS, ER 95%, PR 30% Tis NX stage 0   07/21/2019 Cancer Staging   Staging form: Breast, AJCC 8th Edition - Pathologic stage from 07/21/2019: Stage 0 (pTis (DCIS), pN0, cM0, ER+, PR+) - Signed by Gardenia Phlegm, NP on 08/03/2019   07/05/2020 -  Anti-estrogen oral therapy   10 mg of tamoxifen daily      CHIEF COMPLIANT: Follow-up of right breast DCIS on tamoxifen  INTERVAL HISTORY: Catherine Olsen is a 50 y.o. with above-mentioned history of breast DCIS who underwent a right lumpectomy, and has declined radiation and antiestrogen therapy. She presents to the clinic today for follow-up.  She had a breast MRI showing some enhancement and initially the decision was to biopsy this area.  In the interim she had panic attack and was hospitalized and she recovered from that.  She had lengthy discussions with radiology and the decision was made to watch and monitor this because it does not look  ALLERGIES:  is allergic to other, prednisone, sulfamethoxazole, oxycodone, abilify [aripiprazole], celebrex [celecoxib], molds & smuts, penicillins, tylenol [acetaminophen], lamictal [lamotrigine], percocet [oxycodone-acetaminophen], sulfa antibiotics, and sulfites.  MEDICATIONS:  Current  Outpatient Medications  Medication Sig Dispense Refill   albuterol (VENTOLIN HFA) 108 (90 Base) MCG/ACT inhaler Inhale 2 puffs into the lungs every 4 (four) hours as needed for wheezing or shortness of breath.     calcium citrate (CALCITRATE - DOSED IN MG ELEMENTAL CALCIUM) 950 (200 Ca) MG tablet Take 1 tablet (200 mg of elemental calcium total) by mouth daily. For bone health 30 tablet 0   cariprazine (VRAYLAR) 3 MG capsule Take 1 capsule (3 mg total) by mouth daily. For mood control 30 capsule 0   EPINEPHrine 0.3 mg/0.3 mL IJ SOAJ injection Inject 0.3 mg into the skin as needed for anaphylaxis. 1 each 0   ibuprofen (ADVIL) 800 MG tablet Take 800 mg by mouth every 8 (eight) hours as needed for mild pain.     melatonin 3 MG TABS tablet Take 1 tablet (3 mg total) by mouth at bedtime. For sleep 30 tablet 0   nicotine (NICODERM CQ - DOSED IN MG/24 HOURS) 21 mg/24hr patch Place 1 patch (21 mg total) onto the skin daily. (May buy from over the counter): For smoking cessation 28 patch 0   pantoprazole (PROTONIX) 40 MG tablet Take 1 tablet (40 mg total) by mouth daily. For acid reflux 30 tablet 0   tamoxifen (NOLVADEX) 20 MG tablet Take 1 tablet (20 mg total) by mouth daily. For breast cancer (Patient taking differently: Take 20 mg by mouth every evening. For breast cancer) 90 tablet 3   traZODone (DESYREL) 100 MG tablet Take 2 tablets (200 mg total) by mouth at bedtime as needed for sleep. 30 tablet 0   No current facility-administered medications for this visit.  PHYSICAL EXAMINATION: ECOG PERFORMANCE STATUS: 1 - Symptomatic but completely ambulatory  Vitals:   01/14/21 1137  BP: 110/82  Pulse: 82  Resp: 18  Temp: 97.8 F (36.6 C)  SpO2: 99%   Filed Weights   01/14/21 1137  Weight: 136 lb 11.2 oz (62 kg)      LABORATORY DATA:  I have reviewed the data as listed CMP Latest Ref Rng & Units 01/04/2021 11/25/2020 08/12/2020  Glucose 70 - 99 mg/dL 97 93 89  BUN 6 - 20 mg/dL 15 9 8    Creatinine 0.44 - 1.00 mg/dL 0.72 0.68 0.74  Sodium 135 - 145 mmol/L 141 139 139  Potassium 3.5 - 5.1 mmol/L 4.0 3.4(L) 3.7  Chloride 98 - 111 mmol/L 109 106 108  CO2 22 - 32 mmol/L 26 25 25   Calcium 8.9 - 10.3 mg/dL 8.9 9.3 9.5  Total Protein 6.5 - 8.1 g/dL 6.3(L) 6.5 6.7  Total Bilirubin 0.3 - 1.2 mg/dL 0.5 0.5 0.4  Alkaline Phos 38 - 126 U/L 74 52 71  AST 15 - 41 U/L 12(L) 14(L) 15  ALT 0 - 44 U/L 11 17 14     Lab Results  Component Value Date   WBC 7.9 01/04/2021   HGB 14.2 01/04/2021   HCT 42.5 01/04/2021   MCV 97.3 01/04/2021   PLT 204 01/04/2021   NEUTROABS 3.0 11/25/2020    ASSESSMENT & PLAN:  Ductal carcinoma in situ (DCIS) of right breast Right lumpectomy: Dr. Lucia Gaskins: Intermediate grade DCIS 0.2 cm, 0.1 cm from anterior margin, LCIS, ER 95%, PR 30% Tis NX stage 0  07/06/20: Right Lumpectomy: 0.2 cm IG DCIS Margins Neg, ER /PR Positive Treatment plan: Adjuvant radiation therapy: Patient discussed with Dr. Sondra Come and decided against doing radiation.  ___________________________________________________________________________ Maxillofacial CT scan 11/22/2019: Normal  Dr. Dorethea Clan is her psychiatrist   Breast Cancer Surveillance: Mammogram 10/26/2019: Benign breast density category D Breast MRI 06/29/2020: Interval 5.6 x 3.6 x 2.9 cm area of low-grade ductal enhancement in the upper half of right breast.   Patient decided not to undergo surgery for this and is currently on tamoxifen.   Tamoxifen Toxicities:None   Breast MRI 12/19/2020: New 1 cm non-mass enhancement 8:30 position right breast, no significant change in the ring enhancement 2 areas as before.  Mild improvement in the low-grade ductal enhancement in the retroareolar right breast.  Hospitalization: 01/04/2021-01/10/2021: Mental health crisis Unable to do the MRI guided biopsy    No orders of the defined types were placed in this encounter.  The patient has a good understanding of the overall plan. she agrees  with it. she will call with any problems that may develop before the next visit here.  Total time spent: 20 mins including face to face time and time spent for planning, charting and coordination of care  Rulon Eisenmenger, MD, MPH 01/14/2021  I, Thana Ates, am acting as scribe for Dr. Nicholas Lose.  I have reviewed the above documentation for accuracy and completeness, and I agree with the above.

## 2021-01-14 ENCOUNTER — Other Ambulatory Visit: Payer: Self-pay

## 2021-01-14 ENCOUNTER — Inpatient Hospital Stay: Payer: Medicare Other | Attending: Hematology and Oncology | Admitting: Hematology and Oncology

## 2021-01-14 DIAGNOSIS — Z79810 Long term (current) use of selective estrogen receptor modulators (SERMs): Secondary | ICD-10-CM | POA: Diagnosis not present

## 2021-01-14 DIAGNOSIS — Z170 Estrogen receptor positive status [ER+]: Secondary | ICD-10-CM | POA: Diagnosis not present

## 2021-01-14 DIAGNOSIS — D0511 Intraductal carcinoma in situ of right breast: Secondary | ICD-10-CM | POA: Insufficient documentation

## 2021-01-14 MED ORDER — CALCIUM CITRATE 950 (200 CA) MG PO TABS: 1600.0000 mg | ORAL_TABLET | Freq: Every day | ORAL | 0 refills | Status: DC

## 2021-01-14 MED ORDER — TRAZODONE HCL 150 MG PO TABS: 150.0000 mg | ORAL_TABLET | Freq: Every evening | ORAL | Status: DC | PRN

## 2021-01-14 NOTE — Assessment & Plan Note (Signed)
Right lumpectomy: Dr. Lucia Gaskins: Intermediate grade DCIS 0.2 cm, 0.1 cm from anterior margin, LCIS, ER 95%, PR 30% Tis NX stage 0 07/06/20: Right Lumpectomy: 0.2 cm IG DCIS Margins Neg, ER /PR Positive Treatment plan: Adjuvant radiation therapy: Patient discussed with Dr. Sondra Come anddecided against doing radiation. ___________________________________________________________________________ Maxillofacial CT scan 11/22/2019: Normal Dr. Dorethea Clan is her psychiatrist  Breast Cancer Surveillance: Mammogram 10/26/2019: Benign breast density category D Breast MRI 06/29/2020: Interval 5.6 x 3.6 x 2.9 cm area of low-grade ductal enhancement in the upper half of right breast. Patient decided not to undergo surgery for this and is currently on tamoxifen.  Tamoxifen Toxicities:None  Breast MRI 12/19/2020: New 1 cm non-mass enhancement 8:30 position right breast, no significant change in the ring enhancement 2 areas as before.  Mild improvement in the low-grade ductal enhancement in the retroareolar right breast.  Hospitalization: 01/04/2021-01/10/2021: Mental health crisis Unable to do the MRI guided biopsy

## 2021-01-15 DIAGNOSIS — F319 Bipolar disorder, unspecified: Secondary | ICD-10-CM | POA: Diagnosis not present

## 2021-01-18 ENCOUNTER — Other Ambulatory Visit: Payer: Self-pay | Admitting: Critical Care Medicine

## 2021-01-18 DIAGNOSIS — F3162 Bipolar disorder, current episode mixed, moderate: Secondary | ICD-10-CM | POA: Diagnosis not present

## 2021-01-19 DIAGNOSIS — F3181 Bipolar II disorder: Secondary | ICD-10-CM | POA: Diagnosis not present

## 2021-01-19 DIAGNOSIS — F422 Mixed obsessional thoughts and acts: Secondary | ICD-10-CM | POA: Diagnosis not present

## 2021-01-19 DIAGNOSIS — F4310 Post-traumatic stress disorder, unspecified: Secondary | ICD-10-CM | POA: Diagnosis not present

## 2021-01-19 NOTE — Telephone Encounter (Signed)
Requested medications are due for refill today yes  Requested medications are on the active medication list yes  Last refill 11/11/20  Last visit 09/05/20  Future visit scheduled no  Notes to clinic Historical Provider, please assess.  Requested Prescriptions  Pending Prescriptions Disp Refills   ibuprofen (ADVIL) 800 MG tablet [Pharmacy Med Name: IBUPROFEN 800MG  TABLETS] 90 tablet     Sig: TAKE 1 TABLET(800 MG) BY MOUTH EVERY 8 HOURS AS NEEDED     Analgesics:  NSAIDS Passed - 01/18/2021  3:54 PM      Passed - Cr in normal range and within 360 days    Creatinine  Date Value Ref Range Status  06/21/2020 0.77 0.44 - 1.00 mg/dL Final   Creat  Date Value Ref Range Status  02/08/2015 0.72 0.50 - 1.10 mg/dL Final   Creatinine, Ser  Date Value Ref Range Status  01/04/2021 0.72 0.44 - 1.00 mg/dL Final   Creatinine,U  Date Value Ref Range Status  07/28/2008  mg/dL Final   72.5 (NOTE)  Cutoff Values for Urine Drug Screen:        Drug Class           Cutoff (ng/mL)        Amphetamines            1000        Barbiturates             200        Cocaine Metabolites      300        Benzodiazepines          200        Methadone                 300        Opiates                 2000        Phencyclidine             25        Propoxyphene             300        Marijuana Metabolites     50  For medical purposes only.          Passed - HGB in normal range and within 360 days    Hemoglobin  Date Value Ref Range Status  01/04/2021 14.2 12.0 - 15.0 g/dL Final  06/21/2020 14.1 12.0 - 15.0 g/dL Final  10/29/2017 11.3 11.1 - 15.9 g/dL Final          Passed - Patient is not pregnant      Passed - Valid encounter within last 12 months    Recent Outpatient Visits           4 months ago Panlobular emphysema ([place])   [facility] Elsie Stain, MD   7 months ago Panlobular emphysema [facility])   [facility] Elsie Stain, MD   8  months ago Cervical lymphadenopathy   [facility], Patrick E, MD   11 months ago Bilateral impacted cerumen    [facility], [place], MD   1 year ago Encounter for [employer] annual wellness exam   [facility] [place], Charlane Ferretti, MD

## 2021-01-22 ENCOUNTER — Encounter: Payer: Self-pay | Admitting: Hematology and Oncology

## 2021-01-22 DIAGNOSIS — F319 Bipolar disorder, unspecified: Secondary | ICD-10-CM | POA: Diagnosis not present

## 2021-01-23 ENCOUNTER — Encounter: Payer: Self-pay | Admitting: *Deleted

## 2021-01-23 ENCOUNTER — Telehealth (HOSPITAL_COMMUNITY): Payer: Medicare Other | Admitting: Physician Assistant

## 2021-01-23 ENCOUNTER — Other Ambulatory Visit: Payer: Self-pay

## 2021-01-26 DIAGNOSIS — F3181 Bipolar II disorder: Secondary | ICD-10-CM | POA: Diagnosis not present

## 2021-01-26 DIAGNOSIS — F4310 Post-traumatic stress disorder, unspecified: Secondary | ICD-10-CM | POA: Diagnosis not present

## 2021-01-26 DIAGNOSIS — F422 Mixed obsessional thoughts and acts: Secondary | ICD-10-CM | POA: Diagnosis not present

## 2021-02-01 DIAGNOSIS — F3162 Bipolar disorder, current episode mixed, moderate: Secondary | ICD-10-CM | POA: Diagnosis not present

## 2021-02-01 DIAGNOSIS — F319 Bipolar disorder, unspecified: Secondary | ICD-10-CM | POA: Diagnosis not present

## 2021-02-12 DIAGNOSIS — F4310 Post-traumatic stress disorder, unspecified: Secondary | ICD-10-CM | POA: Diagnosis not present

## 2021-02-12 DIAGNOSIS — F3181 Bipolar II disorder: Secondary | ICD-10-CM | POA: Diagnosis not present

## 2021-02-12 DIAGNOSIS — F422 Mixed obsessional thoughts and acts: Secondary | ICD-10-CM | POA: Diagnosis not present

## 2021-02-27 DIAGNOSIS — F3181 Bipolar II disorder: Secondary | ICD-10-CM | POA: Diagnosis not present

## 2021-02-27 DIAGNOSIS — F4310 Post-traumatic stress disorder, unspecified: Secondary | ICD-10-CM | POA: Diagnosis not present

## 2021-02-27 DIAGNOSIS — F422 Mixed obsessional thoughts and acts: Secondary | ICD-10-CM | POA: Diagnosis not present

## 2021-03-01 DIAGNOSIS — F3162 Bipolar disorder, current episode mixed, moderate: Secondary | ICD-10-CM | POA: Diagnosis not present

## 2021-03-14 DIAGNOSIS — F3162 Bipolar disorder, current episode mixed, moderate: Secondary | ICD-10-CM | POA: Diagnosis not present

## 2021-03-18 ENCOUNTER — Encounter: Payer: Self-pay | Admitting: Hematology and Oncology

## 2021-03-18 ENCOUNTER — Encounter: Payer: Self-pay | Admitting: Critical Care Medicine

## 2021-03-19 ENCOUNTER — Other Ambulatory Visit: Payer: Self-pay

## 2021-03-19 DIAGNOSIS — D0511 Intraductal carcinoma in situ of right breast: Secondary | ICD-10-CM

## 2021-03-19 MED ORDER — IBUPROFEN 800 MG PO TABS: ORAL_TABLET | ORAL | 1 refills | Status: DC

## 2021-03-19 NOTE — Progress Notes (Signed)
MM ordered so pt can schedule at GI

## 2021-03-19 NOTE — Telephone Encounter (Signed)
Pt needs OV with me in January

## 2021-03-20 ENCOUNTER — Encounter: Payer: Self-pay | Admitting: Pharmacist

## 2021-03-20 ENCOUNTER — Other Ambulatory Visit: Payer: Self-pay | Admitting: Pharmacist

## 2021-03-21 ENCOUNTER — Ambulatory Visit: Payer: Self-pay | Admitting: *Deleted

## 2021-03-21 NOTE — Telephone Encounter (Signed)
°  Chief Complaint: ear pain, sore throat Symptoms: sore throat,ear pain- left side Frequency: 1 week Pertinent Negatives: Patient denies fever, congestion Disposition: [] ED /[] Urgent Care (no appt availability in office) / [] Appointment(In office/virtual)/ []  [facility]/ [] Home Care/ [x] Refused Recommended Disposition  Additional Notes: Advised full schedule- no open appointment in office. Patient refuses mobile unit/UC- states she only wants to see Dr Joya Gaskins.

## 2021-03-21 NOTE — Telephone Encounter (Signed)
Pt called saying she needs an appt asap for a possible ear infection and throat infection.  She said this is a chronic problem but several days ago it has gotten worse.   CB#  [phone]  Reason for Disposition  Earache  (Exceptions: brief ear pain of < 60 minutes duration, earache occurring during air travel  Answer Assessment - Initial Assessment Questions 1. LOCATION: "Which ear is involved?"     Left side 2. ONSET: "When did the ear start hurting"      1 week 3. SEVERITY: "How bad is the pain?"  (Scale 1-10; mild, moderate or severe)   - MILD (1-3): doesn't interfere with normal activities    - MODERATE (4-7): interferes with normal activities or awakens from sleep    - SEVERE (8-10): excruciating pain, unable to do any normal activities      mild 4. URI SYMPTOMS: "Do you have a runny nose or cough?"     no 5. FEVER: "Do you have a fever?" If Yes, ask: "What is your temperature, how was it measured, and when did it start?"     no 6. CAUSE: "Have you been swimming recently?", "How often do you use Q-TIPS?", "Have you had any recent air travel or scuba diving?"     no 7. OTHER SYMPTOMS: "Do you have any other symptoms?" (e.g., headache, stiff neck, dizziness, vomiting, runny nose, decreased hearing)     Sore throat 8. PREGNANCY: "Is there any chance you are pregnant?" "When was your last menstrual period?"     na  Answer Assessment - Initial Assessment Questions 1. ONSET: "When did the throat start hurting?" (Hours or days ago)      1 week 2. SEVERITY: "How bad is the sore throat?" (Scale 1-10; mild, moderate or severe)   - MILD (1-3):  doesn't interfere with eating or normal activities   - MODERATE (4-7): interferes with eating some solids and normal activities   - SEVERE (8-10):  excruciating pain, interferes with most normal activities   - SEVERE DYSPHAGIA: can't swallow liquids, drooling     moderate 3. STREP EXPOSURE: "Has there been any exposure to strep within the past  week?" If Yes, ask: "What type of contact occurred?"      no 4.  VIRAL SYMPTOMS: "Are there any symptoms of a cold, such as a runny nose, cough, hoarse voice or red eyes?"      no 5. FEVER: "Do you have a fever?" If Yes, ask: "What is your temperature, how was it measured, and when did it start?"     no 6. PUS ON THE TONSILS: "Is there pus on the tonsils in the back of your throat?"     Swelling on left side of throat 7. OTHER SYMPTOMS: "Do you have any other symptoms?" (e.g., difficulty breathing, headache, rash)     Ear pain 8. PREGNANCY: "Is there any chance you are pregnant?" "When was your last menstrual period?"     na  Protocols used: Sore Throat-A-AH, Bethann Punches

## 2021-03-21 NOTE — Telephone Encounter (Signed)
You can double her at 55 either monday or Wednesday. Advise the pt there will be long wait time due to high case volume

## 2021-03-22 NOTE — Telephone Encounter (Signed)
Appointment scheduled.

## 2021-03-24 NOTE — Progress Notes (Incomplete)
Established Patient Office Visit  Subjective:  Patient ID: Catherine Olsen, female    DOB: [birth year]  Age: 50 y.o. MRN: [id number]  CC: No chief complaint on file.   HPI Catherine Olsen presents for ear pain.  Pcv flu    Past Medical History:  Diagnosis Date   Abnormal uterine bleeding (AUB) 06/25/2009   Qualifier: Diagnosis of  By: Cathren Laine MD, Ankit     Allergy    Angio-edema    Anxiety    Arthritis    hands, lower back, knees   Asthma    Bipolar 1 disorder (HCC)    Breast cancer ([place])    stage 0   Chronic prescription benzodiazepine use 09/08/2018   COPD (chronic obstructive pulmonary disease) (HCC)    Depression    Endometriosis    Fibromyalgia    GERD (gastroesophageal reflux disease)    Headache(784.0)    otc med prn   Migraine    Pituitary tumor    microadenoma   PONV (postoperative nausea and vomiting)    PTSD (post-traumatic stress disorder)    Scoliosis    Termination of pregnancy    x 2 at age 32 and 50 yrs old   Tobacco abuse 03/04/2015   Urticaria     Past Surgical History:  Procedure Laterality Date   BREAST BIOPSY Right 05/19/2007   BREAST BIOPSY Right 06/02/2007   BREAST BIOPSY  06/29/2019   BREAST EXCISIONAL BIOPSY     BREAST LUMPECTOMY Right 07/21/2019   BREAST LUMPECTOMY WITH RADIOACTIVE SEED LOCALIZATION Right 07/21/2019   Procedure: RIGHT BREAST LUMPECTOMY WITH RADIOACTIVE SEED LOCALIZATION;  Surgeon: Alphonsa Overall, MD;  Location: [facility];  Service: General;  Laterality: Right;   BREAST SURGERY     DILATION AND CURETTAGE OF UTERUS     FACIAL COSMETIC SURGERY     right cheek   LAPAROSCOPY Right 11/11/2013   Procedure: LAPAROSCOPY OPERATIVE with  Drainage of  RIGHT Ovarian ENDOMETRIOMA;  Surgeon: Elveria Royals, MD;  Location: [facility];  Service: Gynecology;  Laterality: Right;   NASAL ENDOSCOPY     said it showed some acid reflux from ENT   NOSE SURGERY     rhinoplasty at age 51 yrs   21  TOOTH EXTRACTION      Family History  Problem Relation Age of Onset   Bipolar disorder Mother    Diabetes Mother    Hypertension Mother    Stroke Mother 45       CVA   Mental illness Mother        no diagnosis; personality disorder   Bipolar disorder Father    Hyperlipidemia Father    Hypertension Father    COPD Father    Alpha-1 antitrypsin deficiency Father    Asthma Father    Alpha-1 antitrypsin deficiency Brother    Asthma Brother    Heart disease Maternal Grandfather    Hyperlipidemia Maternal Grandfather    Hypertension Maternal Grandfather    Asthma Maternal Grandfather    Bipolar disorder Maternal Grandmother    Depression Maternal Grandmother    Diabetes Maternal Grandmother    Heart disease Maternal Grandmother    Hyperlipidemia Maternal Grandmother    Hypertension Maternal Grandmother    Mental illness Maternal Grandmother    Heart disease Paternal Grandfather    Hyperlipidemia Paternal Grandfather    Hypertension Paternal Grandfather    Stroke Paternal Grandfather    Alzheimer's disease Paternal Grandmother    Allergic rhinitis Neg Hx  Angioedema Neg Hx    Eczema Neg Hx    Urticaria Neg Hx    Immunodeficiency Neg Hx    Colon cancer Neg Hx    Esophageal cancer Neg Hx     Social History   Socioeconomic History   Marital status: Divorced    Spouse name: Not on file   Number of children: 0   Years of education: Not on file   Highest education level: Bachelor's degree (e.g., BA, AB, BS)  Occupational History   Occupation: disability    Comment: mental illness  Tobacco Use   Smoking status: Every Day    Packs/day: 0.50    Years: 29.00    Pack years: 14.50    Types: Cigarettes    Last attempt to quit: 10/08/2019    Years since quitting: 1.4   Smokeless tobacco: Never  Vaping Use   Vaping Use: Former  Substance and Sexual Activity   Alcohol use: Not Currently    Alcohol/week: 2.0 standard drinks     Types: 2 Cans of beer per week    Comment: 6 nights   Drug use: Not Currently    Types: "Crack" cocaine    Comment: 07/19/2019   Sexual activity: Not Currently    Comment: abortion at 29 and 86yrs. of age   Other Topics Concern   Not on file  Social History Narrative   Marital status: divorced; not dating      Children: none      Lives: alone with mom      Employment: disability for mental illness in 67.  Hospitalizations x 9 in past.      Tobacco: 1 ppd x since age 52. Decreasing in 2018.      Alcohol: socially; beers.      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      Exercise: yoga daily; exercise daily.      ADLs: independent with ADLs; no car; depends on others for transportation.      Patient is right-handed. She is divorced and lives with her mother. She drinks 2-3 cups of 1/2 caffeine coffe a day. She walks occasionally for exercise.   Social Determinants of Health   Financial Resource Strain: Not on file  Food Insecurity: Not on file  Transportation Needs: Not on file  Physical Activity: Not on file  Stress: Not on file  Social Connections: Not on file  Intimate Partner Violence: Not on file    Outpatient Medications Prior to Visit  Medication Sig Dispense Refill   albuterol (VENTOLIN HFA) 108 (90 Base) MCG/ACT inhaler Inhale 2 puffs into the lungs every 4 (four) hours as needed for wheezing or shortness of breath.     calcium citrate (CALCITRATE - DOSED IN MG ELEMENTAL CALCIUM) 950 (200 Ca) MG tablet Take 8 tablets (1,600 mg of elemental calcium total) by mouth daily. For bone health 30 tablet 0   cariprazine (VRAYLAR) 3 MG capsule Take 1 capsule (3 mg total) by mouth daily. For mood control 30 capsule 0   EPINEPHrine 0.3 mg/0.3 mL IJ SOAJ injection Inject 0.3 mg into the skin as needed for anaphylaxis. 1 each 0   ibuprofen (ADVIL) 800 MG tablet TAKE 1 TABLET(800 MG) BY MOUTH EVERY 8 HOURS AS NEEDED 90 tablet 1   nicotine (NICODERM CQ -  DOSED IN MG/24 HOURS) 21 mg/24hr patch Place 1 patch (21 mg total) onto the skin daily. (May buy from over the counter): For smoking cessation 28 patch  0   tamoxifen (NOLVADEX) 20 MG tablet Take 1 tablet (20 mg total) by mouth daily. For breast cancer (Patient taking differently: Take 20 mg by mouth every evening. For breast cancer) 90 tablet 3   traZODone (DESYREL) 150 MG tablet Take 1 tablet (150 mg total) by mouth at bedtime as needed for sleep.     No facility-administered medications prior to visit.    Allergies  Allergen Reactions   Other Anaphylaxis    Idiopathic (possible binder or preservatives in medication)     Prednisone Anaphylaxis    Steroids Steroids ( can take with benadryl )   Can not take higher than 40 mg    Sulfamethoxazole Anaphylaxis   Oxycodone Other (See Comments)    Severe blindness?   Abilify [Aripiprazole]     Generic Abilify--causes severe neurological problems. Can use name brand   Celebrex [Celecoxib]    Molds & Smuts Other (See Comments)    Environmental   Penicillins Hives    Has patient had a PCN reaction causing immediate rash, facial/tongue/throat swelling, SOB or lightheadedness with hypotension: no Has patient had a PCN reaction causing severe rash involving mucus membranes or skin necrosis: NO Has patient had a PCN reaction that required hospitalizationNO Has patient had a PCN reaction occurring within the last 10 years:NO If all of the above answers are "NO", then may proceed with Cephalosporin use.    Tylenol [Acetaminophen]     Stomach pain   Lamictal [Lamotrigine] Rash   Percocet [Oxycodone-Acetaminophen]     Vision loss   Sulfa Antibiotics Hives    Pt states she is not really allergic to sulfa, but to sulfites.   Sulfites Other (See Comments)    unknown    ROS Review of Systems    Objective:    Physical Exam  LMP 11/21/2014  Wt Readings from Last 3 Encounters:  01/14/21 136 lb 11.2 oz (62 kg)  01/03/21  135 lb (61.2 kg)  12/20/20 138 lb 14.4 oz (63 kg)     Health Maintenance Due  Topic Date Due   Pneumococcal Vaccine 32-10 Years old (1 - PCV) Never done   INFLUENZA VACCINE  Never done    There are no preventive care reminders to display for this patient.  Lab Results  Component Value Date   TSH 2.224 01/05/2021   Lab Results  Component Value Date   WBC 7.9 01/04/2021   HGB 14.2 01/04/2021   HCT 42.5 01/04/2021   MCV 97.3 01/04/2021   PLT 204 01/04/2021   Lab Results  Component Value Date   NA 141 01/04/2021   K 4.0 01/04/2021   CO2 26 01/04/2021   GLUCOSE 97 01/04/2021   BUN 15 01/04/2021   CREATININE 0.72 01/04/2021   BILITOT 0.5 01/04/2021   ALKPHOS 74 01/04/2021   AST 12 (L) 01/04/2021   ALT 11 01/04/2021   PROT 6.3 (L) 01/04/2021   ALBUMIN 4.0 01/04/2021   CALCIUM 8.9 01/04/2021   ANIONGAP 6 01/04/2021   Lab Results  Component Value Date   CHOL 155 01/05/2021   Lab Results  Component Value Date   HDL 62 01/05/2021   Lab Results  Component Value Date   LDLCALC 76 01/05/2021   Lab Results  Component Value Date   TRIG 84 01/05/2021   Lab Results  Component Value Date   CHOLHDL 2.5 01/05/2021   Lab Results  Component Value Date   HGBA1C 5.2 01/05/2021      Assessment & Plan:  Problem List Items Addressed This Visit   None   No orders of the defined types were placed in this encounter.   Follow-up: No follow-ups on file.    Asencion Noble, MD

## 2021-03-25 ENCOUNTER — Ambulatory Visit: Payer: Medicare Other | Admitting: Critical Care Medicine

## 2021-03-27 DIAGNOSIS — F3162 Bipolar disorder, current episode mixed, moderate: Secondary | ICD-10-CM | POA: Diagnosis not present

## 2021-03-28 ENCOUNTER — Other Ambulatory Visit: Payer: Self-pay | Admitting: Hematology and Oncology

## 2021-03-28 DIAGNOSIS — F3162 Bipolar disorder, current episode mixed, moderate: Secondary | ICD-10-CM | POA: Diagnosis not present

## 2021-03-28 DIAGNOSIS — D0511 Intraductal carcinoma in situ of right breast: Secondary | ICD-10-CM

## 2021-04-10 DIAGNOSIS — Z20822 Contact with and (suspected) exposure to covid-19: Secondary | ICD-10-CM | POA: Diagnosis not present

## 2021-04-10 DIAGNOSIS — J069 Acute upper respiratory infection, unspecified: Secondary | ICD-10-CM | POA: Diagnosis not present

## 2021-04-10 DIAGNOSIS — J029 Acute pharyngitis, unspecified: Secondary | ICD-10-CM | POA: Diagnosis not present

## 2021-04-18 DIAGNOSIS — F3162 Bipolar disorder, current episode mixed, moderate: Secondary | ICD-10-CM | POA: Diagnosis not present

## 2021-05-08 ENCOUNTER — Ambulatory Visit: Payer: Medicare Other

## 2021-05-08 ENCOUNTER — Ambulatory Visit
Admission: RE | Admit: 2021-05-08 | Discharge: 2021-05-08 | Disposition: A | Discharge status: Home / Self Care | Payer: Medicare Other | Source: Ambulatory Visit | Attending: Hematology and Oncology | Admitting: Hematology and Oncology

## 2021-05-08 DIAGNOSIS — D0511 Intraductal carcinoma in situ of right breast: Secondary | ICD-10-CM

## 2021-05-08 DIAGNOSIS — R922 Inconclusive mammogram: Secondary | ICD-10-CM | POA: Diagnosis not present

## 2021-05-16 ENCOUNTER — Ambulatory Visit: Payer: Medicare Other | Attending: Critical Care Medicine | Admitting: Critical Care Medicine

## 2021-05-16 ENCOUNTER — Encounter: Payer: Self-pay | Admitting: Critical Care Medicine

## 2021-05-16 VITALS — BP 159/95 | HR 77 | Resp 16 | Wt 121.8 lb

## 2021-05-16 DIAGNOSIS — Z79899 Other long term (current) drug therapy: Secondary | ICD-10-CM | POA: Diagnosis not present

## 2021-05-16 DIAGNOSIS — F1410 Cocaine abuse, uncomplicated: Secondary | ICD-10-CM | POA: Diagnosis not present

## 2021-05-16 DIAGNOSIS — Z791 Long term (current) use of non-steroidal anti-inflammatories (NSAID): Secondary | ICD-10-CM | POA: Insufficient documentation

## 2021-05-16 DIAGNOSIS — R6884 Jaw pain: Secondary | ICD-10-CM | POA: Diagnosis not present

## 2021-05-16 DIAGNOSIS — J439 Emphysema, unspecified: Secondary | ICD-10-CM | POA: Insufficient documentation

## 2021-05-16 DIAGNOSIS — R45851 Suicidal ideations: Secondary | ICD-10-CM

## 2021-05-16 DIAGNOSIS — I7090 Unspecified atherosclerosis: Secondary | ICD-10-CM

## 2021-05-16 DIAGNOSIS — J4520 Mild intermittent asthma, uncomplicated: Secondary | ICD-10-CM

## 2021-05-16 DIAGNOSIS — R030 Elevated blood-pressure reading, without diagnosis of hypertension: Secondary | ICD-10-CM | POA: Diagnosis not present

## 2021-05-16 DIAGNOSIS — Z681 Body mass index (BMI) 19 or less, adult: Secondary | ICD-10-CM | POA: Insufficient documentation

## 2021-05-16 DIAGNOSIS — F3162 Bipolar disorder, current episode mixed, moderate: Secondary | ICD-10-CM | POA: Diagnosis not present

## 2021-05-16 DIAGNOSIS — F17200 Nicotine dependence, unspecified, uncomplicated: Secondary | ICD-10-CM

## 2021-05-16 DIAGNOSIS — R630 Anorexia: Secondary | ICD-10-CM

## 2021-05-16 DIAGNOSIS — D352 Benign neoplasm of pituitary gland: Secondary | ICD-10-CM | POA: Diagnosis not present

## 2021-05-16 DIAGNOSIS — F3164 Bipolar disorder, current episode mixed, severe, with psychotic features: Secondary | ICD-10-CM

## 2021-05-16 DIAGNOSIS — J431 Panlobular emphysema: Secondary | ICD-10-CM

## 2021-05-16 DIAGNOSIS — R634 Abnormal weight loss: Secondary | ICD-10-CM | POA: Diagnosis not present

## 2021-05-16 DIAGNOSIS — F17210 Nicotine dependence, cigarettes, uncomplicated: Secondary | ICD-10-CM | POA: Insufficient documentation

## 2021-05-16 DIAGNOSIS — F3163 Bipolar disorder, current episode mixed, severe, without psychotic features: Secondary | ICD-10-CM | POA: Diagnosis not present

## 2021-05-16 DIAGNOSIS — Z7951 Long term (current) use of inhaled steroids: Secondary | ICD-10-CM | POA: Diagnosis not present

## 2021-05-16 DIAGNOSIS — Z5181 Encounter for therapeutic drug level monitoring: Secondary | ICD-10-CM

## 2021-05-16 MED ORDER — IBUPROFEN 800 MG PO TABS: ORAL_TABLET | ORAL | 1 refills | Status: DC

## 2021-05-16 MED ORDER — EPINEPHRINE 0.3 MG/0.3ML IJ SOAJ: 0.3000 mg | INTRAMUSCULAR | 0 refills | Status: AC | PRN

## 2021-05-16 MED ORDER — ALBUTEROL SULFATE HFA 108 (90 BASE) MCG/ACT IN AERS: 2.0000 | INHALATION_SPRAY | RESPIRATORY_TRACT | 1 refills | Status: AC | PRN

## 2021-05-16 NOTE — Progress Notes (Signed)
Established Patient Office Visit  Subjective:  Patient ID: Catherine Olsen, female    DOB: 27-Aug-1970  Age: 51 y.o. MRN: [id number]  CC: Primary care follow-up and evaluation of weight loss HPI Catherine Olsen presents for primary care follow-up.  The patient has dropped from 136 pounds in October 2 121 pounds at this visit.  She states she has not been eating well.  She has been using cocaine every few days by way of inhaling it.  She skips many meals she will have 1 protein drink a day she does not have any appetite she is on lithium now at a higher dose she is worried about lithium toxicity.  She is smoking about a pack a day of cigarettes.  She had a great deal of stress her mother is about to undergo any dialysis.  She follows closely with her mental health provider.  On arrival blood pressure elevated 159/95.  She complains of lymphadenopathy in the left neck and some ear itching.   Past Medical History:  Diagnosis Date   Abnormal uterine bleeding (AUB) 06/25/2009   Qualifier: Diagnosis of  By: Cathren Laine MD, Ankit     Allergy    Angio-edema    Anxiety    Arthritis    hands, lower back, knees   Asthma    Bipolar 1 disorder (HCC)    Breast cancer ([place])    stage 0   Chronic prescription benzodiazepine use 09/08/2018   COPD (chronic obstructive pulmonary disease) (HCC)    Depression    Endometriosis    Fibromyalgia    GERD (gastroesophageal reflux disease)    Headache(784.0)    otc med prn   Migraine    Pituitary tumor    microadenoma   PONV (postoperative nausea and vomiting)    PTSD (post-traumatic stress disorder)    Scoliosis    Suicidal ideation 01/05/2021   Termination of pregnancy    x 2 at age 31 and 51 yrs old   Tobacco abuse 03/04/2015   Urticaria     Past Surgical History:  Procedure Laterality Date   BREAST BIOPSY Right 05/19/2007   BREAST BIOPSY Right 06/02/2007   BREAST BIOPSY  06/29/2019   BREAST EXCISIONAL BIOPSY     BREAST LUMPECTOMY Right 07/21/2019   BREAST  LUMPECTOMY WITH RADIOACTIVE SEED LOCALIZATION Right 07/21/2019   Procedure: RIGHT BREAST LUMPECTOMY WITH RADIOACTIVE SEED LOCALIZATION;  Surgeon: Alphonsa Overall, MD;  Location: [facility];  Service: General;  Laterality: Right;   BREAST SURGERY     DILATION AND CURETTAGE OF UTERUS     FACIAL COSMETIC SURGERY     right cheek   LAPAROSCOPY Right 11/11/2013   Procedure: LAPAROSCOPY OPERATIVE with  Drainage of  RIGHT Ovarian ENDOMETRIOMA;  Surgeon: Elveria Royals, MD;  Location: [facility];  Service: Gynecology;  Laterality: Right;   NASAL ENDOSCOPY     said it showed some acid reflux from ENT   NOSE SURGERY     rhinoplasty at age 39 yrs   86 TOOTH EXTRACTION      Family History  Problem Relation Age of Onset   Bipolar disorder Mother    Diabetes Mother    Hypertension Mother    Stroke Mother 40       CVA   Mental illness Mother        no diagnosis; personality disorder   Bipolar disorder Father    Hyperlipidemia Father    Hypertension Father    COPD Father  Alpha-1 antitrypsin deficiency Father    Asthma Father    Alpha-1 antitrypsin deficiency Brother    Asthma Brother    Heart disease Maternal Grandfather    Hyperlipidemia Maternal Grandfather    Hypertension Maternal Grandfather    Asthma Maternal Grandfather    Bipolar disorder Maternal Grandmother    Depression Maternal Grandmother    Diabetes Maternal Grandmother    Heart disease Maternal Grandmother    Hyperlipidemia Maternal Grandmother    Hypertension Maternal Grandmother    Mental illness Maternal Grandmother    Heart disease Paternal Grandfather    Hyperlipidemia Paternal Grandfather    Hypertension Paternal Grandfather    Stroke Paternal Grandfather    Alzheimer's disease Paternal Grandmother    Allergic rhinitis Neg Hx    Angioedema Neg Hx    Eczema Neg Hx    Urticaria Neg Hx    Immunodeficiency Neg Hx    Colon cancer Neg Hx    Esophageal cancer Neg Hx     Social History    Socioeconomic History   Marital status: Divorced    Spouse name: Not on file   Number of children: 0   Years of education: Not on file   Highest education level: Bachelor's degree (e.g., BA, AB, BS)  Occupational History   Occupation: disability    Comment: mental illness  Tobacco Use   Smoking status: Every Day    Packs/day: 0.50    Years: 29.00    Pack years: 14.50    Types: Cigarettes    Last attempt to quit: 10/08/2019    Years since quitting: 1.6   Smokeless tobacco: Never  Vaping Use   Vaping Use: Former  Substance and Sexual Activity   Alcohol use: Not Currently    Alcohol/week: 2.0 standard drinks    Types: 2 Cans of beer per week    Comment: 6 nights   Drug use: Not Currently    Types: "Crack" cocaine    Comment: 07/19/2019   Sexual activity: Not Currently    Comment: abortion at 56 and 60yrs. of age   Other Topics Concern   Not on file  Social History Narrative   Marital status: divorced; not dating      Children: none      Lives: alone with mom      Employment: disability for mental illness in 46.  Hospitalizations x 9 in past.      Tobacco: 1 ppd x since age 72. Decreasing in 2018.      Alcohol: socially; beers.      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      Exercise: yoga daily; exercise daily.      ADLs: independent with ADLs; no car; depends on others for transportation.      Patient is right-handed. She is divorced and lives with her mother. She drinks 2-3 cups of 1/2 caffeine coffe a day. She walks occasionally for exercise.   Social Determinants of Health   Financial Resource Strain: Not on file  Food Insecurity: Not on file  Transportation Needs: Not on file  Physical Activity: Not on file  Stress: Not on file  Social Connections: Not on file  Intimate Partner Violence: Not on file    Outpatient Medications Prior to Visit  Medication Sig Dispense Refill   calcium citrate (CALCITRATE - DOSED IN MG ELEMENTAL  CALCIUM) 950 (200 Ca) MG tablet Take 8 tablets (1,600 mg of elemental calcium total) by mouth daily. For bone  health 30 tablet 0   cariprazine (VRAYLAR) 3 MG capsule Take 1 capsule (3 mg total) by mouth daily. For mood control (Patient taking differently: Take 1.5 mg by mouth daily. For mood control) 30 capsule 0   lithium 600 MG capsule Take 600 mg by mouth at bedtime.     tamoxifen (NOLVADEX) 20 MG tablet Take 1 tablet (20 mg total) by mouth daily. For breast cancer (Patient taking differently: Take 20 mg by mouth every evening. For breast cancer) 90 tablet 3   traZODone (DESYREL) 100 MG tablet Take by mouth.     VRAYLAR 1.5 MG capsule Take 1.5 mg by mouth daily.     albuterol (VENTOLIN HFA) 108 (90 Base) MCG/ACT inhaler Inhale 2 puffs into the lungs every 4 (four) hours as needed for wheezing or shortness of breath.     EPINEPHrine 0.3 mg/0.3 mL IJ SOAJ injection Inject 0.3 mg into the skin as needed for anaphylaxis. 1 each 0   ibuprofen (ADVIL) 800 MG tablet TAKE 1 TABLET(800 MG) BY MOUTH EVERY 8 HOURS AS NEEDED 90 tablet 1   nicotine (NICODERM CQ - DOSED IN MG/24 HOURS) 21 mg/24hr patch Place 1 patch (21 mg total) onto the skin daily. (May buy from over the counter): For smoking cessation 28 patch 0   traZODone (DESYREL) 150 MG tablet Take 1 tablet (150 mg total) by mouth at bedtime as needed for sleep.     pantoprazole (PROTONIX) 40 MG tablet Take 40 mg by mouth daily.     No facility-administered medications prior to visit.    Allergies  Allergen Reactions   Other Anaphylaxis    Idiopathic (possible binder or preservatives in medication)     Prednisone Anaphylaxis    Steroids Steroids ( can take with benadryl )   Can not take higher than 40 mg    Sulfamethoxazole Anaphylaxis   Oxycodone Other (See Comments)    Severe blindness?   Abilify [Aripiprazole]     Generic Abilify--causes severe neurological problems. Can use name brand   Celebrex [Celecoxib]    Molds [employer] Smuts Other  (See Comments)    Environmental   Penicillins Hives    Has patient had a PCN reaction causing immediate rash, facial/tongue/throat swelling, SOB or lightheadedness with hypotension: no Has patient had a PCN reaction causing severe rash involving mucus membranes or skin necrosis: NO Has patient had a PCN reaction that required hospitalizationNO Has patient had a PCN reaction occurring within the last 10 years:NO If all of the above answers are "NO", then may proceed with Cephalosporin use.    Tylenol [Acetaminophen]     Stomach pain   Lamictal [Lamotrigine] Rash   Percocet [Oxycodone-Acetaminophen]     Vision loss   Sulfa Antibiotics Hives    Pt states she is not really allergic to sulfa, but to sulfites.   Sulfites Other (See Comments)    unknown    ROS Review of Systems  Constitutional: Negative.   HENT: Negative.  Negative for ear discharge, ear pain, postnasal drip, rhinorrhea, sinus pressure, sore throat, trouble swallowing and voice change.        Itching of ears  Eyes: Negative.   Respiratory:  Positive for cough, shortness of breath and wheezing. Negative for apnea, choking, chest tightness and stridor.   Cardiovascular: Negative.  Negative for chest pain, palpitations and leg swelling.  Gastrointestinal: Negative.  Negative for abdominal distention, abdominal pain, nausea and vomiting.  Genitourinary: Negative.   Musculoskeletal: Negative.  Negative for arthralgias  and myalgias.  Skin: Negative.  Negative for rash.  Allergic/Immunologic: Negative.  Negative for environmental allergies and food allergies.  Neurological: Negative.  Negative for dizziness, syncope, weakness and headaches.  Hematological: Negative.  Negative for adenopathy. Does not bruise/bleed easily.  Psychiatric/Behavioral:  Positive for dysphoric mood and sleep disturbance. Negative for agitation and suicidal ideas. The patient is nervous/anxious.      Objective:    Physical Exam Vitals reviewed.   Constitutional:      Appearance: Normal appearance. She is well-developed. She is not diaphoretic.     Comments: Thin  HENT:     Head: Normocephalic and atraumatic.     Right Ear: Tympanic membrane and external ear normal. There is no impacted cerumen.     Left Ear: Tympanic membrane and external ear normal. There is no impacted cerumen.     Ears:     Comments: Minimal wax in both ear canals    Nose: Nose normal. No nasal deformity, septal deviation, mucosal edema, congestion or rhinorrhea.     Right Sinus: No maxillary sinus tenderness or frontal sinus tenderness.     Left Sinus: No maxillary sinus tenderness or frontal sinus tenderness.     Mouth/Throat:     Mouth: Mucous membranes are moist.     Pharynx: Oropharynx is clear. No oropharyngeal exudate.  Eyes:     General: No scleral icterus.    Conjunctiva/sclera: Conjunctivae normal.     Pupils: Pupils are equal, round, and reactive to light.  Neck:     Thyroid: No thyromegaly.     Vascular: No carotid bruit or JVD.     Trachea: Trachea normal. No tracheal tenderness or tracheal deviation.  Cardiovascular:     Rate and Rhythm: Normal rate and regular rhythm.     Chest Wall: PMI is not displaced.     Pulses: Normal pulses. No decreased pulses.     Heart sounds: Normal heart sounds, S1 normal and S2 normal. Heart sounds not distant. No murmur heard. No systolic murmur is present.  No diastolic murmur is present.    No friction rub. No gallop. No S3 or S4 sounds.  Pulmonary:     Effort: Pulmonary effort is normal. No tachypnea, accessory muscle usage or respiratory distress.     Breath sounds: No stridor. No decreased breath sounds, wheezing, rhonchi or rales.     Comments: Distant breath sounds Chest:     Chest wall: No tenderness.  Abdominal:     General: Bowel sounds are normal. There is no distension.     Palpations: Abdomen is soft. Abdomen is not rigid.     Tenderness: There is no abdominal tenderness. There is no  guarding or rebound.  Musculoskeletal:        General: Normal range of motion.     Cervical back: Normal range of motion and neck supple. No edema, erythema or rigidity. No muscular tenderness. Normal range of motion.  Lymphadenopathy:     Head:     Right side of head: No submental or submandibular adenopathy.     Left side of head: No submental or submandibular adenopathy.     Cervical: No cervical adenopathy.  Skin:    General: Skin is warm and dry.     Coloration: Skin is not pale.     Findings: No rash.     Nails: There is no clubbing.  Neurological:     Mental Status: She is oriented to person, place, and time.  Sensory: No sensory deficit.  Psychiatric:        Attention and Perception: Attention and perception normal.        Mood and Affect: Mood is anxious and depressed. Affect is blunt and flat.        Speech: Speech normal.        Behavior: Behavior normal. Behavior is cooperative.        Thought Content: Thought content is not paranoid or delusional. Thought content does not include homicidal or suicidal ideation. Thought content does not include homicidal or suicidal plan.        Cognition and Memory: Cognition and memory normal.        Judgment: Judgment normal.    BP (!) 159/95    Pulse 77    Resp 16    Wt 121 lb 12.8 oz (55.2 kg)    LMP 11/21/2014    SpO2 97%    BMI 18.52 kg/m  Wt Readings from Last 3 Encounters:  05/16/21 121 lb 12.8 oz (55.2 kg)  01/14/21 136 lb 11.2 oz (62 kg)  01/03/21 135 lb (61.2 kg)     There are no preventive care reminders to display for this patient.   There are no preventive care reminders to display for this patient.  Lab Results  Component Value Date   TSH 2.224 01/05/2021   Lab Results  Component Value Date   WBC 7.9 01/04/2021   HGB 14.2 01/04/2021   HCT 42.5 01/04/2021   MCV 97.3 01/04/2021   PLT 204 01/04/2021   Lab Results  Component Value Date   NA 141 01/04/2021   K 4.0 01/04/2021   CO2 26 01/04/2021    GLUCOSE 97 01/04/2021   BUN 15 01/04/2021   CREATININE 0.72 01/04/2021   BILITOT 0.5 01/04/2021   ALKPHOS 74 01/04/2021   AST 12 (L) 01/04/2021   ALT 11 01/04/2021   PROT 6.3 (L) 01/04/2021   ALBUMIN 4.0 01/04/2021   CALCIUM 8.9 01/04/2021   ANIONGAP 6 01/04/2021   Lab Results  Component Value Date   CHOL 155 01/05/2021   Lab Results  Component Value Date   HDL 62 01/05/2021   Lab Results  Component Value Date   LDLCALC 76 01/05/2021   Lab Results  Component Value Date   TRIG 84 01/05/2021   Lab Results  Component Value Date   CHOLHDL 2.5 01/05/2021   Lab Results  Component Value Date   HGBA1C 5.2 01/05/2021      Assessment & Plan:   Problem List Items Addressed This Visit       Cardiovascular and Mediastinum   Atherosclerosis   Relevant Medications   EPINEPHrine 0.3 mg/0.3 mL IJ SOAJ injection   Other Relevant Orders   Lipid panel     Respiratory   Pulmonary emphysema (HCC)   Relevant Medications   albuterol (VENTOLIN HFA) 108 (90 Base) MCG/ACT inhaler   Mild intermittent asthma without complication    Continue as needed albuterol refills given      Relevant Medications   albuterol (VENTOLIN HFA) 108 (90 Base) MCG/ACT inhaler   Other Relevant Orders   CBC with Differential/Platelet     Endocrine   Pituitary microadenoma ([place])     Other   Tobacco use disorder       Current smoking consumption amount: 1 pack a day  Dicsussion on advise to quit smoking and smoking impacts: Cardiovascular lung impacts  Patient's willingness to quit: Not yet willing to quit  Methods  to quit smoking discussed: Behavioral modification  Medication management of smoking session drugs discussed: Not a candidate for Chantix, has failed nicotine replacement in the past  Resources provided:  AVS   Setting quit date not established  Follow-up arranged 6 weeks   Time spent counseling the patient: 5 minutes       Jaw pain    No evidence of lymphadenopathy  in the left jaw or neck area      Cocaine abuse ([place])    Ongoing tobacco use and cocaine use the patient psychiatrist was aware of these issues      Therapeutic drug monitoring    Currently on lithium high-dose we will check lithium levels      Relevant Orders   Lithium level   Anorexia   Relevant Orders   CBC with Differential/Platelet   Lipase   Weight loss - Primary    Check lab studies including vitamin levels she is very anorexic      Relevant Orders   CBC with Differential/Platelet   Comprehensive metabolic panel   Vitamin B1   Thyroid Panel With TSH   RESOLVED: Suicidal ideation    Not currently suicidal      Other Visit Diagnoses     Bipolar disorder, current episode mixed, severe, with psychotic features ([place])   (Chronic)         Meds ordered this encounter  Medications   albuterol (VENTOLIN HFA) 108 (90 Base) MCG/ACT inhaler    Sig: Inhale 2 puffs into the lungs every 4 (four) hours as needed for wheezing or shortness of breath.    Dispense:  18 g    Refill:  1   EPINEPHrine 0.3 mg/0.3 mL IJ SOAJ injection    Sig: Inject 0.3 mg into the skin as needed for anaphylaxis.    Dispense:  1 each    Refill:  0   ibuprofen (ADVIL) 800 MG tablet    Sig: TAKE 1 TABLET(800 MG) BY MOUTH EVERY 8 HOURS AS NEEDED    Dispense:  90 tablet    Refill:  1    Follow-up: Return in about 3 months (around 08/13/2021).    Asencion Noble, MD

## 2021-05-16 NOTE — Assessment & Plan Note (Signed)
° ° °•   Current smoking consumption amount: 1 pack a day   Dicsussion on advise to quit smoking and smoking impacts: Cardiovascular lung impacts   Patient's willingness to quit: Not yet willing to quit   Methods to quit smoking discussed: Behavioral modification   Medication management of smoking session drugs discussed: Not a candidate for Chantix, has failed nicotine replacement in the past   Resources provided:  AVS    Setting quit date not established   Follow-up arranged 6 weeks   Time spent counseling the patient: 5 minutes

## 2021-05-16 NOTE — Assessment & Plan Note (Signed)
Continue as needed albuterol refills given

## 2021-05-16 NOTE — Patient Instructions (Addendum)
Screening labs will be obtained including lithium level  Refill on ibuprofen/ albuterol/  epi pen sent  Work on reducing cocaine use  Keep followup with psychiatry and oncology  Use protein drinks to supplement your diet  REturn Dr Joya Gaskins 3 months

## 2021-05-16 NOTE — Assessment & Plan Note (Signed)
Ongoing tobacco use and cocaine use the patient psychiatrist was aware of these issues

## 2021-05-16 NOTE — Assessment & Plan Note (Signed)
Check lab studies including vitamin levels she is very anorexic

## 2021-05-16 NOTE — Assessment & Plan Note (Signed)
Not currently suicidal

## 2021-05-16 NOTE — Assessment & Plan Note (Signed)
Currently on lithium high-dose we will check lithium levels

## 2021-05-16 NOTE — Assessment & Plan Note (Signed)
No evidence of lymphadenopathy in the left jaw or neck area

## 2021-05-17 ENCOUNTER — Other Ambulatory Visit: Payer: Self-pay

## 2021-05-19 LAB — COMPREHENSIVE METABOLIC PANEL
ALT: 9 IU/L (ref 0–32)
AST: 16 IU/L (ref 0–40)
Albumin/Globulin Ratio: 2.9 — ABNORMAL HIGH (ref 1.2–2.2)
Albumin: 4.6 g/dL (ref 3.8–4.8)
Alkaline Phosphatase: 76 IU/L (ref 44–121)
BUN/Creatinine Ratio: 14 (ref 9–23)
BUN: 10 mg/dL (ref 6–24)
Bilirubin Total: 0.3 mg/dL (ref 0.0–1.2)
CO2: 24 mmol/L (ref 20–29)
Calcium: 9.7 mg/dL (ref 8.7–10.2)
Chloride: 103 mmol/L (ref 96–106)
Creatinine, Ser: 0.69 mg/dL (ref 0.57–1.00)
Globulin, Total: 1.6 g/dL (ref 1.5–4.5)
Glucose: 85 mg/dL (ref 70–99)
Potassium: 3.9 mmol/L (ref 3.5–5.2)
Sodium: 140 mmol/L (ref 134–144)
Total Protein: 6.2 g/dL (ref 6.0–8.5)
eGFR: 106 mL/min/{1.73_m2} (ref >59)

## 2021-05-19 LAB — LIPID PANEL
Chol/HDL Ratio: 2.4 ratio (ref 0.0–4.4)
Cholesterol, Total: 159 mg/dL (ref 100–199)
HDL: 67 mg/dL (ref >39)
LDL Chol Calc (NIH): 78 mg/dL (ref 0–99)
Triglycerides: 73 mg/dL (ref 0–149)
VLDL Cholesterol Cal: 14 mg/dL (ref 5–40)

## 2021-05-19 LAB — THYROID PANEL WITH TSH
Free Thyroxine Index: 1.6 (ref 1.2–4.9)
T3 Uptake Ratio: 23 % — ABNORMAL LOW (ref 24–39)
T4, Total: 7.1 ug/dL (ref 4.5–12.0)
TSH: 3.57 u[IU]/mL (ref 0.450–4.500)

## 2021-05-19 LAB — CBC WITH DIFFERENTIAL/PLATELET
Basophils Absolute: 0 10*3/uL (ref 0.0–0.2)
Basos: 0 %
EOS (ABSOLUTE): 0.7 10*3/uL — ABNORMAL HIGH (ref 0.0–0.4)
Eos: 7 %
Hematocrit: 44.5 % (ref 34.0–46.6)
Hemoglobin: 14.7 g/dL (ref 11.1–15.9)
Immature Grans (Abs): 0 10*3/uL (ref 0.0–0.1)
Immature Granulocytes: 0 %
Lymphocytes Absolute: 1.7 10*3/uL (ref 0.7–3.1)
Lymphs: 18 %
MCH: 32.3 pg (ref 26.6–33.0)
MCHC: 33 g/dL (ref 31.5–35.7)
MCV: 98 fL — ABNORMAL HIGH (ref 79–97)
Monocytes Absolute: 0.5 10*3/uL (ref 0.1–0.9)
Monocytes: 5 %
Neutrophils Absolute: 6.4 10*3/uL (ref 1.4–7.0)
Neutrophils: 70 %
Platelets: 245 10*3/uL (ref 150–450)
RBC: 4.55 x10E6/uL (ref 3.77–5.28)
RDW: 13.8 % (ref 11.7–15.4)
WBC: 9.3 10*3/uL (ref 3.4–10.8)

## 2021-05-19 LAB — VITAMIN B1: Thiamine: 150 nmol/L (ref 66.5–200.0)

## 2021-05-19 LAB — LIPASE: Lipase: 68 U/L (ref 14–72)

## 2021-05-19 LAB — LITHIUM LEVEL: Lithium Lvl: 0.5 mmol/L (ref 0.5–1.2)

## 2021-05-20 ENCOUNTER — Telehealth: Payer: Self-pay

## 2021-05-20 NOTE — Telephone Encounter (Signed)
Pt was called and vm was left, Information has been sent to nurse pool.   

## 2021-05-20 NOTE — Telephone Encounter (Signed)
-----   Message from Elsie Stain, MD sent at 05/20/2021  6:15 AM EST ----- Let pt know lithium level normal, blood count nromal, kidney liver normal,  protein levels in body normal so no signs of severe malnutrition, thyroid normal, cholesterol normal, pancreas normal

## 2021-05-25 ENCOUNTER — Encounter: Payer: Self-pay | Admitting: Critical Care Medicine

## 2021-05-26 MED ORDER — TRETINOIN 0.025 % EX CREA: TOPICAL_CREAM | Freq: Every day | CUTANEOUS | 0 refills | Status: DC

## 2021-06-13 ENCOUNTER — Encounter: Payer: Medicare Other | Admitting: Critical Care Medicine

## 2021-06-13 DIAGNOSIS — F3162 Bipolar disorder, current episode mixed, moderate: Secondary | ICD-10-CM | POA: Diagnosis not present

## 2021-06-19 ENCOUNTER — Ambulatory Visit
Admission: RE | Admit: 2021-06-19 | Discharge: 2021-06-19 | Disposition: A | Discharge status: Home / Self Care | Payer: Medicare Other | Source: Ambulatory Visit | Attending: Hematology and Oncology | Admitting: Hematology and Oncology

## 2021-06-19 ENCOUNTER — Other Ambulatory Visit: Payer: Self-pay

## 2021-06-19 DIAGNOSIS — C50911 Malignant neoplasm of unspecified site of right female breast: Secondary | ICD-10-CM | POA: Diagnosis not present

## 2021-06-19 DIAGNOSIS — C50912 Malignant neoplasm of unspecified site of left female breast: Secondary | ICD-10-CM | POA: Diagnosis not present

## 2021-06-19 DIAGNOSIS — D0511 Intraductal carcinoma in situ of right breast: Secondary | ICD-10-CM

## 2021-06-19 MED ORDER — GADOBUTROL 1 MMOL/ML IV SOLN
6.0000 mL | Freq: Once | INTRAVENOUS | Status: AC | PRN
  Administered 2021-06-19: 6 mL via INTRAVENOUS

## 2021-06-20 ENCOUNTER — Telehealth: Payer: Self-pay | Admitting: Hematology and Oncology

## 2021-06-20 ENCOUNTER — Encounter: Payer: Self-pay | Admitting: Hematology and Oncology

## 2021-06-20 NOTE — Telephone Encounter (Signed)
.  Called patient to schedule appointment per 3/16 inbasket, patient is aware of date and time.   ?

## 2021-06-21 ENCOUNTER — Telehealth: Payer: Self-pay | Admitting: *Deleted

## 2021-06-21 NOTE — Telephone Encounter (Signed)
Called and spoke with patient regarding the need for biopsies from her recent MRI. Patient states she spoke with Dr. Joneen Caraway who reviewed with Dr. Rosana Hoes and it was decided that there wasn't much change and that she could wait and be re-imaged in 6 months.   She stated she was not having any biopsies.  I reviewed her follow up appt with Dr. Lindi Adie 3/23.   Team notified. ?

## 2021-06-25 ENCOUNTER — Telehealth: Payer: Self-pay | Admitting: Hematology and Oncology

## 2021-06-25 NOTE — Telephone Encounter (Signed)
Rescheduled appointment per providers template. Left message.  ? ?

## 2021-06-26 NOTE — Progress Notes (Signed)
? ?Patient Care Team: ?Elsie Stain, MD as PCP - General (Pulmonary Disease) ?Services, [facility] as Referring Physician ?Christen Butter, MD as Referring Physician (Gynecology) ?Melida Quitter, MD as Consulting Physician (Otolaryngology) ?Loretta Plume as Consulting Physician (Neurosurgery) ? ?DIAGNOSIS:  ?Encounter Diagnosis  ?Name Primary?  ? Ductal carcinoma in situ (DCIS) of right breast   ? ? ?SUMMARY OF ONCOLOGIC HISTORY: ?Oncology History  ?Ductal carcinoma in situ (DCIS) of right breast  ?06/29/2019 Initial Diagnosis  ? Right breast biopsy: Fibrocystic changes, pseudoangiomatous stromal hyperplasia ?  ?07/21/2019 Surgery  ? Right lumpectomy: Dr. Lucia Gaskins: Intermediate grade DCIS 0.2 cm, 0.1 cm from anterior margin, LCIS, ER 95%, PR 30% Tis NX stage 0 ?  ?07/21/2019 Cancer Staging  ? Staging form: Breast, AJCC 8th Edition ?- Pathologic stage from 07/21/2019: Stage 0 (pTis (DCIS), pN0, cM0, ER+, PR+) - Signed by Gardenia Phlegm, NP on 08/03/2019 ? ?  ?07/05/2020 -  Anti-estrogen oral therapy  ? 10 mg of tamoxifen daily  ?  ? ? ?CHIEF COMPLIANT:  Follow-up of right breast DCIS on tamoxifen ? ?INTERVAL HISTORY: Catherine Olsen is a 51 y.o. with above-mentioned history of breast DCIS who underwent a right lumpectomy, and has declined radiation and antiestrogen therapy. She presents to the clinic today for follow-up. She state that she is tolerating Tamoxifen. She complains of itching skin.  She had to stop the lithium because of side effects.  She had a recent breast MRI that showed non-mass enhancement that the radiologist recommended a biopsy.  She discussed with Dr. Mariea Clonts and according to her he felt that it could be watched and repeat another MRI in 6 months and follow-up. ? ? ?ALLERGIES:  is allergic to other, prednisone, sulfamethoxazole, oxycodone, abilify [aripiprazole], celebrex [celecoxib], molds & smuts, penicillins, tylenol [acetaminophen], lamictal [lamotrigine], percocet  [oxycodone-acetaminophen], sulfa antibiotics, and sulfites. ? ?MEDICATIONS:  ?Current Outpatient Medications  ?Medication Sig Dispense Refill  ? albuterol (VENTOLIN HFA) 108 (90 Base) MCG/ACT inhaler Inhale 2 puffs into the lungs every 4 (four) hours as needed for wheezing or shortness of breath. 18 g 1  ? calcium citrate (CALCITRATE - DOSED IN MG ELEMENTAL CALCIUM) 950 (200 Ca) MG tablet Take 8 tablets (1,600 mg of elemental calcium total) by mouth daily. For bone health 30 tablet 0  ? cariprazine (VRAYLAR) 3 MG capsule Take 1 capsule (3 mg total) by mouth daily. For mood control (Patient taking differently: Take 1.5 mg by mouth daily. For mood control) 30 capsule 0  ? EPINEPHrine 0.3 mg/0.3 mL IJ SOAJ injection Inject 0.3 mg into the skin as needed for anaphylaxis. 1 each 0  ? ibuprofen (ADVIL) 800 MG tablet TAKE 1 TABLET(800 MG) BY MOUTH EVERY 8 HOURS AS NEEDED 90 tablet 1  ? tamoxifen (NOLVADEX) 20 MG tablet Take 1 tablet (20 mg total) by mouth daily. For breast cancer (Patient taking differently: Take 20 mg by mouth every evening. For breast cancer) 90 tablet 3  ? traZODone (DESYREL) 100 MG tablet Take by mouth.    ? tretinoin (RETIN-A) 0.025 % cream Apply topically at bedtime. 45 g 0  ? VRAYLAR 1.5 MG capsule Take 1.5 mg by mouth daily.    ? ?No current facility-administered medications for this visit.  ? ? ?PHYSICAL EXAMINATION: ?ECOG PERFORMANCE STATUS: 1 - Symptomatic but completely ambulatory ? ?Vitals:  ? 06/27/21 1442  ?BP: 117/69  ?Pulse: (!) 118  ?Resp: 18  ?Temp: (!) 97.5 ?F (36.4 ?C)  ?SpO2: 98%  ? ?Filed Weights  ?  06/27/21 1442  ?Weight: 119 lb 6.4 oz (54.2 kg)  ? ?  ? ?LABORATORY DATA:  ?I have reviewed the data as listed ? ?  Latest Ref Rng & Units 05/16/2021  ? 11:47 AM 01/04/2021  ? 12:00 AM 11/25/2020  ?  5:43 AM  ?CMP  ?Glucose 70 - 99 mg/dL 85   97   93    ?BUN 6 - 24 mg/dL '10   15   9    '$ ?Creatinine 0.57 - 1.00 mg/dL 0.69   0.72   0.68    ?Sodium 134 - 144 mmol/L 140   141   139    ?Potassium  3.5 - 5.2 mmol/L 3.9   4.0   3.4    ?Chloride 96 - 106 mmol/L 103   109   106    ?CO2 20 - 29 mmol/L '24   26   25    '$ ?Calcium 8.7 - 10.2 mg/dL 9.7   8.9   9.3    ?Total Protein 6.0 - 8.5 g/dL 6.2   6.3   6.5    ?Total Bilirubin 0.0 - 1.2 mg/dL 0.3   0.5   0.5    ?Alkaline Phos 44 - 121 IU/L 76   74   52    ?AST 0 - 40 IU/L '16   12   14    '$ ?ALT 0 - 32 IU/L '9   11   17    '$ ? ? ?Lab Results  ?Component Value Date  ? WBC 9.3 05/16/2021  ? HGB 14.7 05/16/2021  ? HCT 44.5 05/16/2021  ? MCV 98 (H) 05/16/2021  ? PLT 245 05/16/2021  ? NEUTROABS 6.4 05/16/2021  ? ? ?ASSESSMENT & PLAN:  ?Ductal carcinoma in situ (DCIS) of right breast ?Right lumpectomy: Dr. Lucia Gaskins: Intermediate grade DCIS 0.2 cm, 0.1 cm from anterior margin, LCIS, ER 95%, PR 30% ?Tis NX stage 0 ? 07/06/20: Right Lumpectomy: 0.2 cm IG DCIS Margins Neg, ER /PR Positive ?Treatment plan: ?Adjuvant radiation therapy: Patient discussed with Dr. Sondra Come and decided against doing radiation. ? ___________________________________________________________________________ ?Maxillofacial CT scan 11/22/2019: Normal  ?Dr. Dorethea Clan is her psychiatrist ?  ?Breast Cancer Surveillance: ?Mammogram 10/26/2019: Benign breast density category D ?Breast MRI 06/29/2020: Interval 5.6 x 3.6 x 2.9 cm area of low-grade ductal enhancement in the upper half of right breast.   ?Patient decided not to undergo surgery for this and is currently on tamoxifen. ?  ?Tamoxifen Toxicities:None ?  ?Breast MRI 12/19/2020: New 1 cm non-mass enhancement 8:30 position right breast, no significant change in the ring enhancement 2 areas as before.  Mild improvement in the low-grade ductal enhancement in the retroareolar right breast. ?  ?Hospitalization: 01/04/2021-01/10/2021: Mental health crisis  ?Mammogram 05/08/2021: Benign  ?Breast MRI 06/20/2021: Recommending MRI guided core biopsy of the 1.5 cm focal area of non-mass enhancement, additionally recommend 1-2 site biopsy of the more diffuse non-mass enhancement within  the superior aspect of the right breast.  Patient discussed with Dr. Mariea Clonts and and they have came up with a conclusion that she does not need these biopsies. ? ?We will plan to obtain another breast MRI in 6 months and follow-up after that. ? ? ? ?Orders Placed This Encounter  ?Procedures  ? MR BREAST BILATERAL W WO CONTRAST INC CAD  ?  Standing Status:   Future  ?  Standing Expiration Date:   06/28/2022  ?  Order Specific Question:   If indicated for the ordered procedure, I authorize the  administration of contrast media per Radiology protocol  ?  Answer:   Yes  ?  Order Specific Question:   What is the patient's sedation requirement?  ?  Answer:   No Sedation  ?  Order Specific Question:   Does the patient have a pacemaker or implanted devices?  ?  Answer:   No  ?  Order Specific Question:   Preferred imaging location?  ?  Answer:   [facility] (table limit-550lbs)  ?  Order Specific Question:   Release to patient  ?  Answer:   Immediate  ? ?The patient has a good understanding of the overall plan. she agrees with it. she will call with any problems that may develop before the next visit here. ?Total time spent: 30 mins including face to face time and time spent for planning, charting and co-ordination of care ? ? Catherine Ohara, MD ?06/27/21 ? ? ? I Gardiner Coins am scribing for Dr. Lindi Adie ? ?I have reviewed the above documentation for accuracy and completeness, and I agree with the above. ?  ?

## 2021-06-27 ENCOUNTER — Other Ambulatory Visit: Payer: Self-pay

## 2021-06-27 ENCOUNTER — Inpatient Hospital Stay: Payer: Medicare Other | Attending: Hematology and Oncology | Admitting: Hematology and Oncology

## 2021-06-27 DIAGNOSIS — Z79810 Long term (current) use of selective estrogen receptor modulators (SERMs): Secondary | ICD-10-CM | POA: Insufficient documentation

## 2021-06-27 DIAGNOSIS — D0511 Intraductal carcinoma in situ of right breast: Secondary | ICD-10-CM | POA: Insufficient documentation

## 2021-06-27 DIAGNOSIS — F3162 Bipolar disorder, current episode mixed, moderate: Secondary | ICD-10-CM | POA: Diagnosis not present

## 2021-06-27 NOTE — Assessment & Plan Note (Addendum)
Right lumpectomy: Dr. Lucia Gaskins: Intermediate grade DCIS 0.2 cm, 0.1 cm from anterior margin, LCIS, ER 95%, PR 30% ?Tis NX stage 0 ??07/06/20: Right Lumpectomy: 0.2 cm IG DCIS Margins Neg, ER /PR Positive ?Treatment plan: ?Adjuvant radiation therapy: Patient discussed with Dr. Sondra Come and?decided against doing radiation. ??___________________________________________________________________________ ?Maxillofacial CT scan 11/22/2019: Normal? ?Dr. Dorethea Clan is her psychiatrist ?? ?Breast Cancer Surveillance: ?Mammogram 10/26/2019: Benign breast density category D ?Breast MRI 06/29/2020: Interval 5.6 x 3.6 x 2.9 cm area of low-grade ductal enhancement in the upper half of right breast.?? ?Patient decided not to undergo surgery for this and is currently on tamoxifen. ?? ?Tamoxifen Toxicities:None ?? ?Breast MRI 12/19/2020: New 1 cm non-mass enhancement 8:30 position right breast, no significant change in the ring enhancement 2 areas as before. ?Mild improvement in the low-grade ductal enhancement in the retroareolar right breast. ?? ?Hospitalization: 01/04/2021-01/10/2021: Mental health crisis  ?Mammogram 05/08/2021: Benign  ?Breast MRI 06/20/2021: Recommending MRI guided core biopsy of the 1.5 cm focal area of non-mass enhancement, additionally recommend 1-2 site biopsy of the more diffuse non-mass enhancement within the superior aspect of the right breast.  Patient discussed with Dr. Mariea Clonts and and they have came up with a conclusion that she does not need these biopsies. ? ?We will plan to obtain another breast MRI in 6 months and follow-up after that. ?

## 2021-07-01 ENCOUNTER — Telehealth: Payer: Self-pay | Admitting: Hematology and Oncology

## 2021-07-01 NOTE — Telephone Encounter (Signed)
Scheduled appointment per 3/23 los. Patient is aware. ?

## 2021-07-15 ENCOUNTER — Ambulatory Visit: Payer: Medicare Other | Admitting: Hematology and Oncology

## 2021-07-21 ENCOUNTER — Encounter: Payer: Self-pay | Admitting: Hematology and Oncology

## 2021-07-22 DIAGNOSIS — F3162 Bipolar disorder, current episode mixed, moderate: Secondary | ICD-10-CM | POA: Diagnosis not present

## 2021-07-29 ENCOUNTER — Other Ambulatory Visit: Payer: Self-pay | Admitting: Hematology and Oncology

## 2021-08-02 ENCOUNTER — Ambulatory Visit: Payer: Medicare Other | Admitting: Hematology and Oncology

## 2021-08-05 ENCOUNTER — Inpatient Hospital Stay: Payer: Medicare Other | Attending: Hematology and Oncology | Admitting: Hematology and Oncology

## 2021-08-05 DIAGNOSIS — F3162 Bipolar disorder, current episode mixed, moderate: Secondary | ICD-10-CM | POA: Diagnosis not present

## 2021-08-05 NOTE — Assessment & Plan Note (Deleted)
Right lumpectomy: Dr. Lucia Gaskins: Intermediate grade DCIS 0.2 cm, 0.1 cm from anterior margin, LCIS, ER 95%, PR 30% Tis NX stage 0 07/06/20: Right Lumpectomy: 0.2 cm IG DCIS Margins Neg, ER /PR Positive Treatment plan: Adjuvant radiation therapy: Patient discussed with Dr. Sondra Come anddecided against doing radiation. ___________________________________________________________________________ Maxillofacial CT scan 11/22/2019: Normal Dr. Dorethea Clan is her psychiatrist  Breast Cancer Surveillance: Mammogram 10/26/2019: Benign breast density category D Breast MRI 06/29/2020: Interval 5.6 x 3.6 x 2.9 cm area of low-grade ductal enhancement in the upper half of right breast. Patient decided not to undergo surgery for this and is currently on tamoxifen.  Tamoxifen Toxicities:None  Breast MRI 12/19/2020: New 1 cm non-mass enhancement 8:30 position right breast, no significant change in the ring enhancement 2 areas as before. Mild improvement in the low-grade ductal enhancement in the retroareolar right breast.  Hospitalization: 01/04/2021-01/10/2021: Mental health crisis   Breast cancer surveillance: Mammogram 05/08/2021: Benign  Breast MRI 06/20/2021: Recommending MRI guided core biopsy of the 1.5 cm focal area of non-mass enhancement

## 2021-08-12 ENCOUNTER — Other Ambulatory Visit: Payer: Self-pay | Admitting: *Deleted

## 2021-08-12 DIAGNOSIS — F3162 Bipolar disorder, current episode mixed, moderate: Secondary | ICD-10-CM | POA: Diagnosis not present

## 2021-08-12 MED ORDER — TAMOXIFEN CITRATE 10 MG PO TABS: ORAL_TABLET | ORAL | 0 refills | Status: DC

## 2021-08-21 ENCOUNTER — Ambulatory Visit: Payer: Medicare Other | Admitting: Hematology and Oncology

## 2021-08-26 DIAGNOSIS — F3162 Bipolar disorder, current episode mixed, moderate: Secondary | ICD-10-CM | POA: Diagnosis not present

## 2021-08-28 NOTE — Progress Notes (Incomplete)
Patient Care Team: Elsie Stain, MD as PCP - General (Pulmonary Disease) Services, [facility] as Referring Physician Christen Butter, MD as Referring Physician (Gynecology) Melida Quitter, MD as Consulting Physician (Otolaryngology) Loretta Plume as Consulting Physician (Neurosurgery)  DIAGNOSIS: No diagnosis found.  SUMMARY OF ONCOLOGIC HISTORY: Oncology History  Ductal carcinoma in situ (DCIS) of right breast  06/29/2019 Initial Diagnosis   Right breast biopsy: Fibrocystic changes, pseudoangiomatous stromal hyperplasia   07/21/2019 Surgery   Right lumpectomy: Dr. Lucia Gaskins: Intermediate grade DCIS 0.2 cm, 0.1 cm from anterior margin, LCIS, ER 95%, PR 30% Tis NX stage 0   07/21/2019 Cancer Staging   Staging form: Breast, AJCC 8th Edition - Pathologic stage from 07/21/2019: Stage 0 (pTis (DCIS), pN0, cM0, ER+, PR+) - Signed by Gardenia Phlegm, NP on 08/03/2019    07/05/2020 -  Anti-estrogen oral therapy   10 mg of tamoxifen daily      CHIEF COMPLIANT: Follow-up of right breast DCIS on tamoxifen    INTERVAL HISTORY: Catherine Olsen is a 51 y.o. with above-mentioned history of breast DCIS who underwent a right lumpectomy, and has declined radiation and antiestrogen therapy. She presents to the clinic today for follow-up.   ALLERGIES:  is allergic to other, prednisone, sulfamethoxazole, oxycodone, abilify [aripiprazole], celebrex [celecoxib], molds & smuts, penicillins, tylenol [acetaminophen], lamictal [lamotrigine], percocet [oxycodone-acetaminophen], sulfa antibiotics, and sulfites.  MEDICATIONS:  Current Outpatient Medications  Medication Sig Dispense Refill   albuterol (VENTOLIN HFA) 108 (90 Base) MCG/ACT inhaler Inhale 2 puffs into the lungs every 4 (four) hours as needed for wheezing or shortness of breath. 18 g 1   calcium citrate (CALCITRATE - DOSED IN MG ELEMENTAL CALCIUM) 950 (200 Ca) MG tablet Take 8 tablets (1,600 mg of elemental calcium total) by mouth  daily. For bone health 30 tablet 0   cariprazine (VRAYLAR) 3 MG capsule Take 1 capsule (3 mg total) by mouth daily. For mood control (Patient taking differently: Take 1.5 mg by mouth daily. For mood control) 30 capsule 0   EPINEPHrine 0.3 mg/0.3 mL IJ SOAJ injection Inject 0.3 mg into the skin as needed for anaphylaxis. 1 each 0   ibuprofen (ADVIL) 800 MG tablet TAKE 1 TABLET(800 MG) BY MOUTH EVERY 8 HOURS AS NEEDED 90 tablet 1   tamoxifen (NOLVADEX) 10 MG tablet Take as directed 90 tablet 0   traZODone (DESYREL) 100 MG tablet Take by mouth.     tretinoin (RETIN-A) 0.025 % cream Apply topically at bedtime. 45 g 0   VRAYLAR 1.5 MG capsule Take 1.5 mg by mouth daily.     No current facility-administered medications for this visit.    PHYSICAL EXAMINATION: ECOG PERFORMANCE STATUS: {CHL ONC ECOG PS:[id number]}  There were no vitals filed for this visit. There were no vitals filed for this visit.  BREAST:*** No palpable masses or nodules in either right or left breasts. No palpable axillary supraclavicular or infraclavicular adenopathy no breast tenderness or nipple discharge. (exam performed in the presence of a chaperone)  LABORATORY DATA:  I have reviewed the data as listed    Latest Ref Rng & Units 05/16/2021   11:47 AM 01/04/2021   12:00 AM 11/25/2020    5:43 AM  CMP  Glucose 70 - 99 mg/dL 85   97   93    BUN 6 - 24 mg/dL '10   15   9    '$ Creatinine 0.57 - 1.00 mg/dL 0.69   0.72   0.68    Sodium  134 - 144 mmol/L 140   141   139    Potassium 3.5 - 5.2 mmol/L 3.9   4.0   3.4    Chloride 96 - 106 mmol/L 103   109   106    CO2 20 - 29 mmol/L '24   26   25    '$ Calcium 8.7 - 10.2 mg/dL 9.7   8.9   9.3    Total Protein 6.0 - 8.5 g/dL 6.2   6.3   6.5    Total Bilirubin 0.0 - 1.2 mg/dL 0.3   0.5   0.5    Alkaline Phos 44 - 121 IU/L 76   74   52    AST 0 - 40 IU/L '16   12   14    '$ ALT 0 - 32 IU/L '9   11   17      '$ Lab Results  Component Value Date   WBC 9.3 05/16/2021   HGB 14.7 05/16/2021    HCT 44.5 05/16/2021   MCV 98 (H) 05/16/2021   PLT 245 05/16/2021   NEUTROABS 6.4 05/16/2021    ASSESSMENT & PLAN:  No problem-specific Assessment & Plan notes found for this encounter.    No orders of the defined types were placed in this encounter.  The patient has a good understanding of the overall plan. she agrees with it. she will call with any problems that may develop before the next visit here. Total time spent: 30 mins including face to face time and time spent for planning, charting and co-ordination of care   Suzzette Righter, [place] 08/28/21    I Gardiner Coins am scribing for Dr. Lindi Adie  ***

## 2021-09-03 ENCOUNTER — Inpatient Hospital Stay: Payer: Medicare Other | Admitting: Hematology and Oncology

## 2021-09-03 NOTE — Assessment & Plan Note (Deleted)
Right lumpectomy: Dr. Lucia Gaskins: Intermediate grade DCIS 0.2 cm, 0.1 cm from anterior margin, LCIS, ER 95%, PR 30% Tis NX stage 0 07/06/20: Right Lumpectomy: 0.2 cm IG DCIS Margins Neg, ER /PR Positive Treatment plan: Adjuvant radiation therapy: Patient discussed with Dr. Sondra Come anddecided against doing radiation. ___________________________________________________________________________ Maxillofacial CT scan 11/22/2019: Normal Dr. Dorethea Clan is her psychiatrist  Breast Cancer Surveillance: Mammogram 10/26/2019: Benign breast density category D Breast MRI 06/29/2020: Interval 5.6 x 3.6 x 2.9 cm area of low-grade ductal enhancement in the upper half of right breast. Patient decided not to undergo surgery for this and is currently on tamoxifen.  Tamoxifen Toxicities:None  Breast MRI 12/19/2020: New 1 cm non-mass enhancement 8:30 position right breast, no significant change in the ring enhancement 2 areas as before. Mild improvement in the low-grade ductal enhancement in the retroareolar right breast.  Hospitalization: 01/04/2021-01/10/2021: Mental health crisis  Mammogram 05/08/2021: Benign  Breast MRI 06/20/2021: Recommending MRI guided core biopsy of the 1.5 cm focal area of non-mass enhancement, additionally recommend 1-2 site biopsy of the more diffuse non-mass enhancement within the superior aspect of the right breast.  Patient discussed with Dr. Mariea Clonts and and they have came up with a conclusion that she does not need these biopsies.  We will plan to obtain another breast MRI in 3 months and follow-up after that.

## 2021-09-26 ENCOUNTER — Other Ambulatory Visit: Payer: Self-pay | Admitting: Critical Care Medicine

## 2021-09-28 ENCOUNTER — Encounter (HOSPITAL_COMMUNITY): Payer: Self-pay | Admitting: Emergency Medicine

## 2021-09-28 ENCOUNTER — Emergency Department (HOSPITAL_COMMUNITY)
Admission: EM | Admit: 2021-09-28 | Discharge: 2021-09-30 | Disposition: A | Discharge status: Other Acute Inpatient Hospital | Payer: Medicare Other | Attending: Emergency Medicine | Admitting: Emergency Medicine

## 2021-09-28 ENCOUNTER — Other Ambulatory Visit: Payer: Self-pay

## 2021-09-28 ENCOUNTER — Other Ambulatory Visit: Payer: Self-pay | Admitting: Critical Care Medicine

## 2021-09-28 DIAGNOSIS — F1490 Cocaine use, unspecified, uncomplicated: Secondary | ICD-10-CM | POA: Insufficient documentation

## 2021-09-28 DIAGNOSIS — Z20822 Contact with and (suspected) exposure to covid-19: Secondary | ICD-10-CM | POA: Insufficient documentation

## 2021-09-28 DIAGNOSIS — R45851 Suicidal ideations: Secondary | ICD-10-CM | POA: Insufficient documentation

## 2021-09-28 DIAGNOSIS — F3163 Bipolar disorder, current episode mixed, severe, without psychotic features: Secondary | ICD-10-CM | POA: Insufficient documentation

## 2021-09-28 DIAGNOSIS — J449 Chronic obstructive pulmonary disease, unspecified: Secondary | ICD-10-CM | POA: Diagnosis not present

## 2021-09-28 DIAGNOSIS — R059 Cough, unspecified: Secondary | ICD-10-CM | POA: Diagnosis not present

## 2021-09-28 DIAGNOSIS — F309 Manic episode, unspecified: Secondary | ICD-10-CM | POA: Insufficient documentation

## 2021-09-28 LAB — COMPREHENSIVE METABOLIC PANEL
ALT: 16 U/L (ref 0–44)
AST: 19 U/L (ref 15–41)
Albumin: 4 g/dL (ref 3.5–5.0)
Alkaline Phosphatase: 80 U/L (ref 38–126)
Anion gap: 7 (ref 5–15)
BUN: 39 mg/dL — ABNORMAL HIGH (ref 6–20)
CO2: 26 mmol/L (ref 22–32)
Calcium: 9.6 mg/dL (ref 8.9–10.3)
Chloride: 108 mmol/L (ref 98–111)
Creatinine, Ser: 0.79 mg/dL (ref 0.44–1.00)
GFR, Estimated: >60 mL/min (ref >60)
Glucose, Bld: 114 mg/dL — ABNORMAL HIGH (ref 70–99)
Potassium: 4.2 mmol/L (ref 3.5–5.1)
Sodium: 141 mmol/L (ref 135–145)
Total Bilirubin: 0.3 mg/dL (ref 0.3–1.2)
Total Protein: 7 g/dL (ref 6.5–8.1)

## 2021-09-28 LAB — CBC
HCT: 43.8 % (ref 36.0–46.0)
Hemoglobin: 15 g/dL (ref 12.0–15.0)
MCH: 32.5 pg (ref 26.0–34.0)
MCHC: 34.2 g/dL (ref 30.0–36.0)
MCV: 95 fL (ref 80.0–100.0)
Platelets: 257 10*3/uL (ref 150–400)
RBC: 4.61 MIL/uL (ref 3.87–5.11)
RDW: 13.3 % (ref 11.5–15.5)
WBC: 8.4 10*3/uL (ref 4.0–10.5)
nRBC: 0 % (ref 0.0–0.2)

## 2021-09-28 LAB — ACETAMINOPHEN LEVEL: Acetaminophen (Tylenol), Serum: <10 ug/mL — ABNORMAL LOW (ref 10–30)

## 2021-09-28 LAB — ETHANOL: Alcohol, Ethyl (B): <10 mg/dL (ref <10)

## 2021-09-28 LAB — SALICYLATE LEVEL: Salicylate Lvl: <7 mg/dL — ABNORMAL LOW (ref 7.0–30.0)

## 2021-09-28 NOTE — ED Notes (Signed)
Pt belongings transferred with pt from triage. Belongings placed in cabinet at nurses station labeled "16-18 and Resus A".

## 2021-09-29 ENCOUNTER — Emergency Department (HOSPITAL_COMMUNITY): Payer: Medicare Other

## 2021-09-29 DIAGNOSIS — J449 Chronic obstructive pulmonary disease, unspecified: Secondary | ICD-10-CM | POA: Diagnosis not present

## 2021-09-29 DIAGNOSIS — R059 Cough, unspecified: Secondary | ICD-10-CM | POA: Diagnosis not present

## 2021-09-29 DIAGNOSIS — F3163 Bipolar disorder, current episode mixed, severe, without psychotic features: Secondary | ICD-10-CM | POA: Diagnosis not present

## 2021-09-29 LAB — RAPID URINE DRUG SCREEN, HOSP PERFORMED
Amphetamines: NOT DETECTED
Barbiturates: NOT DETECTED
Benzodiazepines: NOT DETECTED
Cocaine: POSITIVE — AB
Opiates: NOT DETECTED
Tetrahydrocannabinol: NOT DETECTED

## 2021-09-29 LAB — URINALYSIS, ROUTINE W REFLEX MICROSCOPIC
Bilirubin Urine: NEGATIVE
Glucose, UA: NEGATIVE mg/dL
Hgb urine dipstick: NEGATIVE
Ketones, ur: NEGATIVE mg/dL
Nitrite: NEGATIVE
Protein, ur: NEGATIVE mg/dL
Specific Gravity, Urine: 1.015 (ref 1.005–1.030)
pH: 6 (ref 5.0–8.0)

## 2021-09-29 LAB — PREGNANCY, URINE: Preg Test, Ur: NEGATIVE

## 2021-09-29 LAB — VALPROIC ACID LEVEL: Valproic Acid Lvl: <10 ug/mL — ABNORMAL LOW (ref 50.0–100.0)

## 2021-09-29 LAB — SARS CORONAVIRUS 2 BY RT PCR: SARS Coronavirus 2 by RT PCR: NEGATIVE

## 2021-09-29 MED ORDER — TAMOXIFEN CITRATE 10 MG PO TABS
20.0000 mg | ORAL_TABLET | Freq: Every day | ORAL | Status: DC
Start: 2021-09-30 — End: 2021-09-29

## 2021-09-29 MED ORDER — ENSURE ENLIVE PO LIQD
237.0000 mL | Freq: Two times a day (BID) | ORAL | Status: DC
  Administered 2021-09-29 – 2021-09-30 (×2): 237 mL via ORAL
  Filled 2021-09-29 (×3): qty 237

## 2021-09-29 MED ORDER — CARIPRAZINE HCL 1.5 MG PO CAPS
1.5000 mg | ORAL_CAPSULE | Freq: Every day | ORAL | Status: DC
  Administered 2021-09-30: 1.5 mg via ORAL
  Filled 2021-09-29 (×2): qty 1

## 2021-09-29 MED ORDER — TAMOXIFEN CITRATE 10 MG PO TABS
20.0000 mg | ORAL_TABLET | Freq: Every day | ORAL | Status: DC
  Administered 2021-09-30: 20 mg via ORAL
  Filled 2021-09-29: qty 2

## 2021-09-29 MED ORDER — TRAZODONE HCL 100 MG PO TABS
100.0000 mg | ORAL_TABLET | Freq: Every day | ORAL | Status: DC
  Administered 2021-09-29: 100 mg via ORAL
  Filled 2021-09-29: qty 1

## 2021-09-29 MED ORDER — TAMOXIFEN CITRATE 10 MG PO TABS
20.0000 mg | ORAL_TABLET | Freq: Every day | ORAL | Status: DC
  Filled 2021-09-29: qty 2

## 2021-09-29 MED ORDER — CLONAZEPAM 0.5 MG PO TABS
0.5000 mg | ORAL_TABLET | Freq: Every day | ORAL | Status: DC
  Administered 2021-09-29: 0.5 mg via ORAL
  Filled 2021-09-29: qty 1

## 2021-09-29 MED ORDER — DIVALPROEX SODIUM ER 500 MG PO TB24
500.0000 mg | ORAL_TABLET | Freq: Every day | ORAL | Status: DC
Start: 2021-09-29 — End: 2021-09-30
  Filled 2021-09-29: qty 1

## 2021-09-29 MED ORDER — TAMOXIFEN CITRATE 10 MG PO TABS
10.0000 mg | ORAL_TABLET | Freq: Every day | ORAL | Status: DC
Start: 2021-09-29 — End: 2021-09-29

## 2021-09-29 MED ORDER — DIVALPROEX SODIUM ER 500 MG PO TB24: 500.0000 mg | ORAL_TABLET | Freq: Every day | ORAL | Status: DC

## 2021-09-29 MED ORDER — IBUPROFEN 200 MG PO TABS
600.0000 mg | ORAL_TABLET | Freq: Three times a day (TID) | ORAL | Status: DC | PRN
  Administered 2021-09-29 – 2021-09-30 (×2): 600 mg via ORAL
  Filled 2021-09-29 (×2): qty 3

## 2021-09-29 NOTE — Progress Notes (Signed)
Per Dahlia Byes, NP, patient meets criteria for inpatient treatment. There are no available beds at [facility] today. CSW faxed referrals to the following facilities for review:  [facility]  Pending - Request Sent N/A [address] [place] [address] [phone] --  [facility]  Pending - Request Sent N/A [address], [place] [address] [phone] --  [facility]  Pending - Request Sent N/A [address] [place] [address] [phone] --  [facility]  Pending - Request Sent N/A [address] [place] [address] [phone] --  [facility]  Pending - Request Sent N/A [address] [place] [address] [phone] --  [facility]  Pending - Request Sent N/A [address] [place], [place] [address] [phone] --  [facility]  Pending - Request Sent N/A [address] [place] [address] [phone] --  [facility]  Pending - Request Sent N/A [address] [place] [address] [phone] --  [facility]  Pending - Request Sent N/A [address] [place] [address] [phone] --  [facility]  Pending - Request Sent N/A [address], [place] [address] [phone] --  [facility]  Pending - Request Sent N/A [address] [place] [address] [phone] --  [facility]  Pending - Request Sent N/A [address], [place] [address] [phone] --  [facility]  Pending - Request Sent N/A [address] [place] [address] [phone] --   TTS will continue to seek bed placement.  Crissie Reese,  MSW, Lenice Pressman Phone: [phone] Disposition/TOC

## 2021-09-30 DIAGNOSIS — F3163 Bipolar disorder, current episode mixed, severe, without psychotic features: Secondary | ICD-10-CM | POA: Diagnosis not present

## 2021-09-30 DIAGNOSIS — F332 Major depressive disorder, recurrent severe without psychotic features: Secondary | ICD-10-CM | POA: Diagnosis not present

## 2021-09-30 NOTE — ED Provider Notes (Signed)
Emergency Medicine Observation Re-evaluation Note  Catherine Olsen is a 51 y.o. female, seen on rounds today.  Pt initially presented to the ED for complaints of Suicidal, Psychiatric Evaluation, and Manic Behavior Currently, the patient is resting comfortably.  Physical Exam  BP 113/80 (BP Location: Right Arm)   Pulse 84   Temp 97.7 F (36.5 C) (Oral)   Resp 16   Ht 5\' 8"  (1.727 m)   Wt 53.3 kg   LMP 11/21/2014   SpO2 98%   BMI 17.88 kg/m  Physical Exam General: NAD   ED Course / MDM  EKG:   I have reviewed the labs performed to date as well as medications administered while in observation.  Recent changes in the last 24 hours include no acute events reported.  Plan  Current plan is for placement.  Catherine Olsen is not under involuntary commitment.     Wynetta Fines, MD 09/30/21 [phone]

## 2021-10-01 ENCOUNTER — Telehealth: Payer: Self-pay

## 2021-10-01 DIAGNOSIS — F332 Major depressive disorder, recurrent severe without psychotic features: Secondary | ICD-10-CM | POA: Diagnosis not present

## 2021-10-02 DIAGNOSIS — F332 Major depressive disorder, recurrent severe without psychotic features: Secondary | ICD-10-CM | POA: Diagnosis not present

## 2021-10-03 DIAGNOSIS — F332 Major depressive disorder, recurrent severe without psychotic features: Secondary | ICD-10-CM | POA: Diagnosis not present

## 2021-10-04 ENCOUNTER — Encounter: Payer: Self-pay | Admitting: Critical Care Medicine

## 2021-10-04 DIAGNOSIS — F332 Major depressive disorder, recurrent severe without psychotic features: Secondary | ICD-10-CM | POA: Diagnosis not present

## 2021-10-06 MED ORDER — IBUPROFEN 800 MG PO TABS: ORAL_TABLET | ORAL | 1 refills | Status: DC

## 2021-10-06 MED ORDER — TRETINOIN 0.025 % EX CREA
1.0000 | TOPICAL_CREAM | Freq: Every day | CUTANEOUS | 0 refills | Status: DC
Start: 2021-10-06 — End: 2023-03-26

## 2021-10-06 NOTE — Telephone Encounter (Signed)
Needs appt with me in next 3-4 weeks

## 2021-10-07 ENCOUNTER — Telehealth: Payer: Self-pay

## 2021-10-07 NOTE — Telephone Encounter (Signed)
Called pt and left vm about getting her in with pcp in 3-4 weeks

## 2021-10-10 DIAGNOSIS — F3162 Bipolar disorder, current episode mixed, moderate: Secondary | ICD-10-CM | POA: Diagnosis not present

## 2021-10-21 DIAGNOSIS — F3162 Bipolar disorder, current episode mixed, moderate: Secondary | ICD-10-CM | POA: Diagnosis not present

## 2021-10-27 ENCOUNTER — Other Ambulatory Visit: Payer: Self-pay

## 2021-10-27 ENCOUNTER — Encounter: Payer: Self-pay | Admitting: Hematology and Oncology

## 2021-10-27 ENCOUNTER — Emergency Department (HOSPITAL_COMMUNITY)
Admission: EM | Admit: 2021-10-27 | Discharge: 2021-10-27 | Disposition: A | Discharge status: Home / Self Care | Payer: Medicare Other | Attending: Emergency Medicine | Admitting: Emergency Medicine

## 2021-10-27 ENCOUNTER — Encounter (HOSPITAL_COMMUNITY): Payer: Self-pay

## 2021-10-27 DIAGNOSIS — Z853 Personal history of malignant neoplasm of breast: Secondary | ICD-10-CM | POA: Diagnosis not present

## 2021-10-27 DIAGNOSIS — Z5321 Procedure and treatment not carried out due to patient leaving prior to being seen by health care provider: Secondary | ICD-10-CM | POA: Diagnosis not present

## 2021-10-27 DIAGNOSIS — N644 Mastodynia: Secondary | ICD-10-CM | POA: Insufficient documentation

## 2021-10-27 LAB — CBC WITH DIFFERENTIAL/PLATELET
Abs Immature Granulocytes: 0.05 10*3/uL (ref 0.00–0.07)
Basophils Absolute: 0 10*3/uL (ref 0.0–0.1)
Basophils Relative: 0 %
Eosinophils Absolute: 0.6 10*3/uL — ABNORMAL HIGH (ref 0.0–0.5)
Eosinophils Relative: 4 %
HCT: 40.6 % (ref 36.0–46.0)
Hemoglobin: 14 g/dL (ref 12.0–15.0)
Immature Granulocytes: 0 %
Lymphocytes Relative: 10 %
Lymphs Abs: 1.4 10*3/uL (ref 0.7–4.0)
MCH: 33.1 pg (ref 26.0–34.0)
MCHC: 34.5 g/dL (ref 30.0–36.0)
MCV: 96 fL (ref 80.0–100.0)
Monocytes Absolute: 0.8 10*3/uL (ref 0.1–1.0)
Monocytes Relative: 5 %
Neutro Abs: 11.4 10*3/uL — ABNORMAL HIGH (ref 1.7–7.7)
Neutrophils Relative %: 81 %
Platelets: 234 10*3/uL (ref 150–400)
RBC: 4.23 MIL/uL (ref 3.87–5.11)
RDW: 13.5 % (ref 11.5–15.5)
WBC: 14.2 10*3/uL — ABNORMAL HIGH (ref 4.0–10.5)
nRBC: 0 % (ref 0.0–0.2)

## 2021-10-27 LAB — BASIC METABOLIC PANEL
Anion gap: 6 (ref 5–15)
BUN: 17 mg/dL (ref 6–20)
CO2: 26 mmol/L (ref 22–32)
Calcium: 8.7 mg/dL — ABNORMAL LOW (ref 8.9–10.3)
Chloride: 106 mmol/L (ref 98–111)
Creatinine, Ser: 0.69 mg/dL (ref 0.44–1.00)
GFR, Estimated: >60 mL/min (ref >60)
Glucose, Bld: 97 mg/dL (ref 70–99)
Potassium: 3.9 mmol/L (ref 3.5–5.1)
Sodium: 138 mmol/L (ref 135–145)

## 2021-10-27 NOTE — ED Provider Triage Note (Signed)
Emergency Medicine Provider Triage Evaluation Note  Catherine Olsen , a 51 y.o. female  was evaluated in triage.  Pt complains of right breast pain onset 3 days.  Has swelling to the area and erythema to the area.  Patient has a history of breast cancer in the right breast with a lumpectomy.  Notes that she was told she was to have additional surgeries however has opted for MRI every 6 months.  Her oncologist is Dr. Lindi Adie.  Denies drainage or fever.  Review of Systems  Positive: As per HPI Negative:   Physical Exam  BP 130/86 (BP Location: Right Arm)   Pulse (!) 102   Temp 99.4 F (37.4 C) (Oral)   Resp (!) 22   Ht '5\' 8"'$  (1.727 m)   Wt 53.1 kg   LMP 11/21/2014   SpO2 97%   BMI 17.79 kg/m  Gen:   Awake, no distress   Resp:  Normal effort  MSK:   Moves extremities without difficulty  Other:  Breast exam deferred in triage.  Medical Decision Making  Medically screening exam initiated at 1:38 PM.  Appropriate orders placed.  VALRIE JIA was informed that the remainder of the evaluation will be completed by another provider, this initial triage assessment does not replace that evaluation, and the importance of remaining in the ED until their evaluation is complete.  Work-up initiated.    Valon Glasscock A, PA-C 10/27/21 1339

## 2021-10-27 NOTE — ED Notes (Signed)
Left and said she was going to call oncologist

## 2021-10-27 NOTE — ED Triage Notes (Signed)
Reports right breast is swollen and red . Reports hx of breast cancer in right breast before and unsure if it is in her duct.

## 2021-10-28 NOTE — Progress Notes (Signed)
Patient Care Team: Elsie Stain, MD as PCP - General (Pulmonary Disease) Nicholas Lose, MD as PCP - Hematology/Oncology (Hematology and Oncology) Services, [facility] as Referring Physician Christen Butter, MD as Referring Physician (Gynecology) Melida Quitter, MD as Consulting Physician (Otolaryngology) Loretta Plume as Consulting Physician (Neurosurgery)  DIAGNOSIS: No diagnosis found.  SUMMARY OF ONCOLOGIC HISTORY: Oncology History  Ductal carcinoma in situ (DCIS) of right breast  06/29/2019 Initial Diagnosis   Right breast biopsy: Fibrocystic changes, pseudoangiomatous stromal hyperplasia   07/21/2019 Surgery   Right lumpectomy: Dr. Lucia Gaskins: Intermediate grade DCIS 0.2 cm, 0.1 cm from anterior margin, LCIS, ER 95%, PR 30% Tis NX stage 0   07/21/2019 Cancer Staging   Staging form: Breast, AJCC 8th Edition - Pathologic stage from 07/21/2019: Stage 0 (pTis (DCIS), pN0, cM0, ER+, PR+) - Signed by Gardenia Phlegm, NP on 08/03/2019   07/05/2020 -  Anti-estrogen oral therapy   10 mg of tamoxifen daily      CHIEF COMPLIANT:   INTERVAL HISTORY: Catherine Olsen is a   ALLERGIES:  is allergic to other, prednisone, sulfamethoxazole, oxycodone, abilify [aripiprazole], celebrex [celecoxib], misc. sulfonamide containing compounds, molds & smuts, penicillins, tylenol [acetaminophen], lamictal [lamotrigine], percocet [oxycodone-acetaminophen], sulfa antibiotics, and sulfites.  MEDICATIONS:  Current Outpatient Medications  Medication Sig Dispense Refill   albuterol (VENTOLIN HFA) 108 (90 Base) MCG/ACT inhaler Inhale 2 puffs into the lungs every 4 (four) hours as needed for wheezing or shortness of breath. 18 g 1   clonazePAM (KLONOPIN) 0.5 MG tablet Take 0.5 mg by mouth at bedtime.     divalproex (DEPAKOTE ER) 500 MG 24 hr tablet Take 500 mg by mouth at bedtime.     EPINEPHrine 0.3 mg/0.3 mL IJ SOAJ injection Inject 0.3 mg into the skin as needed for anaphylaxis. 1 each 0    ibuprofen (ADVIL) 800 MG tablet TAKE 1 TABLET(800 MG) BY MOUTH EVERY 8 HOURS AS NEEDED 90 tablet 1   tamoxifen (NOLVADEX) 10 MG tablet Take as directed (Patient taking differently: Take 20 mg by mouth daily. Take as directed) 90 tablet 0   traZODone (DESYREL) 100 MG tablet Take 200 mg by mouth at bedtime.     tretinoin (RETIN-A) 0.025 % cream Apply 1 Application topically at bedtime. Apply to affected areas at bedtime 45 g 0   VRAYLAR 1.5 MG capsule Take 1.5 mg by mouth daily.     No current facility-administered medications for this visit.    PHYSICAL EXAMINATION: ECOG PERFORMANCE STATUS: {CHL ONC ECOG PS:[id number]}  There were no vitals filed for this visit. There were no vitals filed for this visit.  BREAST:*** No palpable masses or nodules in either right or left breasts. No palpable axillary supraclavicular or infraclavicular adenopathy no breast tenderness or nipple discharge. (exam performed in the presence of a chaperone)  LABORATORY DATA:  I have reviewed the data as listed    Latest Ref Rng & Units 10/27/2021    1:48 PM 09/28/2021   10:22 PM 05/16/2021   11:47 AM  CMP  Glucose 70 - 99 mg/dL 97  114  85   BUN 6 - 20 mg/dL 17  39  10   Creatinine 0.44 - 1.00 mg/dL 0.69  0.79  0.69   Sodium 135 - 145 mmol/L 138  141  140   Potassium 3.5 - 5.1 mmol/L 3.9  4.2  3.9   Chloride 98 - 111 mmol/L 106  108  103   CO2 22 - 32 mmol/L 26  26  24   Calcium 8.9 - 10.3 mg/dL 8.7  9.6  9.7   Total Protein 6.5 - 8.1 g/dL  7.0  6.2   Total Bilirubin 0.3 - 1.2 mg/dL  0.3  0.3   Alkaline Phos 38 - 126 U/L  80  76   AST 15 - 41 U/L  19  16   ALT 0 - 44 U/L  16  9     Lab Results  Component Value Date   WBC 14.2 (H) 10/27/2021   HGB 14.0 10/27/2021   HCT 40.6 10/27/2021   MCV 96.0 10/27/2021   PLT 234 10/27/2021   NEUTROABS 11.4 (H) 10/27/2021    ASSESSMENT & PLAN:  No problem-specific Assessment & Plan notes found for this encounter.    No orders of the defined types were  placed in this encounter.  The patient has a good understanding of the overall plan. she agrees with it. she will call with any problems that may develop before the next visit here. Total time spent: 30 mins including face to face time and time spent for planning, charting and co-ordination of care   Suzzette Righter, [place] 10/28/21    I Gardiner Coins am scribing for Dr. Lindi Adie  ***

## 2021-10-29 ENCOUNTER — Inpatient Hospital Stay: Payer: Medicare Other | Attending: Hematology and Oncology | Admitting: Hematology and Oncology

## 2021-10-29 ENCOUNTER — Telehealth: Payer: Self-pay | Admitting: *Deleted

## 2021-10-29 ENCOUNTER — Other Ambulatory Visit: Payer: Self-pay

## 2021-10-29 DIAGNOSIS — N6489 Other specified disorders of breast: Secondary | ICD-10-CM | POA: Diagnosis not present

## 2021-10-29 DIAGNOSIS — D0511 Intraductal carcinoma in situ of right breast: Secondary | ICD-10-CM

## 2021-10-29 NOTE — Assessment & Plan Note (Signed)
Right lumpectomy: Dr. Lucia Gaskins: Intermediate grade DCIS 0.2 cm, 0.1 cm from anterior margin, LCIS, ER 95%, PR 30% Tis NX stage 0 07/06/20: Right Lumpectomy: 0.2 cm IG DCIS Margins Neg, ER /PR Positive Treatment plan: Adjuvant radiation therapy: Patient discussed with Dr. Sondra Come anddecided against doing radiation. ___________________________________________________________________________ Maxillofacial CT scan 11/22/2019: Normal Dr. Dorethea Clan is her psychiatrist  Breast Cancer Surveillance: Mammogram 10/26/2019: Benign breast density category D Breast MRI 06/29/2020: Interval 5.6 x 3.6 x 2.9 cm area of low-grade ductal enhancement in the upper half of right breast. Patient decided not to undergo surgery for this and is currently on tamoxifen.  Tamoxifen Toxicities:None  Hospitalization: 01/04/2021-01/10/2021: Mental health crisis  Mammogram 05/08/2021: Benign  Breast MRI 06/20/2021: Recommending MRI guided core biopsy of the 1.5 cm focal area of non-mass enhancement, additionally recommend 1-2 site biopsy of the more diffuse non-mass enhancement within the superior aspect of the right breast.  Patient discussed with Dr. Mariea Clonts and and they have came up with a conclusion that she does not need these biopsies.  Return to clinic after the next breast MRI.

## 2021-10-29 NOTE — Telephone Encounter (Signed)
Per MD request, RN scheduled right breast US at the [facility].  Pt notified and verbalized understanding of appt date and time.

## 2021-10-30 ENCOUNTER — Ambulatory Visit
Admission: RE | Admit: 2021-10-30 | Discharge: 2021-10-30 | Disposition: A | Discharge status: Home / Self Care | Payer: Medicare Other | Source: Ambulatory Visit | Attending: Hematology and Oncology | Admitting: Hematology and Oncology

## 2021-10-30 DIAGNOSIS — N6011 Diffuse cystic mastopathy of right breast: Secondary | ICD-10-CM | POA: Diagnosis not present

## 2021-10-30 DIAGNOSIS — D0511 Intraductal carcinoma in situ of right breast: Secondary | ICD-10-CM

## 2021-11-01 ENCOUNTER — Other Ambulatory Visit: Payer: Medicare Other

## 2021-11-11 ENCOUNTER — Encounter: Payer: Self-pay | Admitting: Hematology and Oncology

## 2021-11-14 ENCOUNTER — Other Ambulatory Visit: Payer: Self-pay | Admitting: Hematology and Oncology

## 2021-11-14 ENCOUNTER — Ambulatory Visit: Payer: Self-pay

## 2021-11-14 NOTE — Patient Instructions (Signed)
Visit Information  Thank you for taking time to visit with me today. Please don't hesitate to contact me if I can be of assistance to you.   Following are the goals we discussed today:   Goals Addressed             This Visit's Progress    COMPLETED: Care Coordination Activities       Care Coordination Interventions: Contacted patient to screen for needs in the home - patient reports she is doing well with no acute needs at this time Performed chart review to note patient has not participated in an annual wellness visit this year - patient indicates she is aware of this and does not feel it is necessary at this time        Please call the care guide team at [phone] if you need to schedule an appointment with out care coordination team  If you are experiencing a Mental Health or [place] or need someone to talk to, please call 1-800-273-TALK (toll free, 24 hour hotline)  Patient verbalizes understanding of instructions and care plan provided today and agrees to view in [place]. Active MyChart status and patient understanding of how to access instructions and care plan via MyChart confirmed with patient.     No further follow up required: Please contact your primary care provider as needed  Daneen Schick, BSW, CDP Social Worker, Certified Dementia Practitioner Care Coordination [phone]

## 2021-11-14 NOTE — Patient Outreach (Signed)
  Care Coordination   Initial Visit Note   11/14/2021 Name: Catherine Olsen MRN: [id number] DOB: 09-25-1970  Catherine Olsen is a 51 y.o. year old female who sees Elsie Stain, MD for primary care. I spoke with  Catherine Olsen by phone today  What matters to the patients health and wellness today?  No concerns, doing well    Goals Addressed             This Visit's Progress    COMPLETED: Care Coordination Activities       Care Coordination Interventions: Contacted patient to screen for needs in the home - patient reports she is doing well with no acute needs at this time Performed chart review to note patient has not participated in an annual wellness visit this year - patient indicates she is aware of this and does not feel it is necessary at this time        SDOH assessments and interventions completed:  No     Care Coordination Interventions Activated:  Yes  Care Coordination Interventions:  Yes, provided   Follow up plan: No further intervention required.   Encounter Outcome:  Pt. Visit Completed   Daneen Schick, BSW, CDP Social Worker, Certified Dementia Practitioner Care Coordination [phone]

## 2021-11-18 DIAGNOSIS — F3162 Bipolar disorder, current episode mixed, moderate: Secondary | ICD-10-CM | POA: Diagnosis not present

## 2021-12-16 DIAGNOSIS — F3162 Bipolar disorder, current episode mixed, moderate: Secondary | ICD-10-CM | POA: Diagnosis not present

## 2021-12-21 ENCOUNTER — Ambulatory Visit
Admission: RE | Admit: 2021-12-21 | Discharge: 2021-12-21 | Disposition: A | Discharge status: Home / Self Care | Payer: Medicare Other | Source: Ambulatory Visit | Attending: Hematology and Oncology | Admitting: Hematology and Oncology

## 2021-12-21 DIAGNOSIS — D0511 Intraductal carcinoma in situ of right breast: Secondary | ICD-10-CM

## 2021-12-21 DIAGNOSIS — N6489 Other specified disorders of breast: Secondary | ICD-10-CM | POA: Diagnosis not present

## 2021-12-21 MED ORDER — GADOBUTROL 1 MMOL/ML IV SOLN
5.0000 mL | Freq: Once | INTRAVENOUS | Status: AC | PRN
  Administered 2021-12-21: 5 mL via INTRAVENOUS

## 2021-12-28 ENCOUNTER — Other Ambulatory Visit: Payer: Medicare Other

## 2022-01-04 ENCOUNTER — Encounter: Payer: Self-pay | Admitting: Critical Care Medicine

## 2022-01-06 NOTE — Telephone Encounter (Signed)
Carly you can add her on next week on thursday

## 2022-01-09 ENCOUNTER — Inpatient Hospital Stay: Payer: Medicare Other | Attending: Hematology and Oncology | Admitting: Hematology and Oncology

## 2022-01-09 NOTE — Assessment & Plan Note (Deleted)
Right lumpectomy: Dr. Lucia Gaskins: Intermediate grade DCIS 0.2 cm, 0.1 cm from anterior margin, LCIS, ER 95%, PR 30% Tis NX stage 0 07/06/20: Right Lumpectomy: 0.2 cm IG DCIS Margins Neg, ER /PR Positive Treatment plan: Adjuvant radiation therapy: Patient discussed with Dr. Sondra Come anddecided against doing radiation. ___________________________________________________________________________ Maxillofacial CT scan 11/22/2019: Normal Dr. Dorethea Clan is her psychiatrist  Breast Cancer Surveillance: Mammogram 10/26/2019: Benign breast density category D Breast MRI 06/29/2020: Interval 5.6 x 3.6 x 2.9 cm area of low-grade ductal enhancement in the upper half of right breast.  Breast MRI 12/23/2021: Redemonstration of patchy non-mass enhancement right breast similar.  No lymph nodes Patient decided not to undergo surgery for this and is currently on tamoxifen.  Tamoxifen Toxicities:None  Hospitalization: 01/04/2021-01/10/2021: Mental health crisis Mammogram 05/08/2021: Benign

## 2022-01-12 ENCOUNTER — Encounter: Payer: Self-pay | Admitting: Hematology and Oncology

## 2022-02-01 NOTE — Telephone Encounter (Signed)
Carly see if we can get this pt in with Carrolyn Meiers for her throat complaints

## 2022-02-03 ENCOUNTER — Telehealth: Payer: Self-pay

## 2022-02-03 NOTE — Telephone Encounter (Signed)
Called patient but  unable to make contact to make appointment    Mailbox is full

## 2022-02-10 ENCOUNTER — Other Ambulatory Visit: Payer: Self-pay | Admitting: Family Medicine

## 2022-02-10 MED ORDER — IBUPROFEN 800 MG PO TABS: ORAL_TABLET | ORAL | 0 refills | Status: DC

## 2022-02-10 NOTE — Telephone Encounter (Signed)
Carly  pls get this pt on for appt in December with me face to face   I know she doesn't often answer her phone

## 2022-02-11 ENCOUNTER — Inpatient Hospital Stay: Payer: Medicare Other | Attending: Hematology and Oncology | Admitting: Hematology and Oncology

## 2022-02-11 ENCOUNTER — Telehealth: Payer: Self-pay

## 2022-02-11 ENCOUNTER — Other Ambulatory Visit: Payer: Self-pay

## 2022-02-11 VITALS — BP 122/88 | HR 92 | Temp 97.8°F | Resp 18 | Ht 68.0 in | Wt 132.2 lb

## 2022-02-11 DIAGNOSIS — Z79810 Long term (current) use of selective estrogen receptor modulators (SERMs): Secondary | ICD-10-CM | POA: Insufficient documentation

## 2022-02-11 DIAGNOSIS — D0511 Intraductal carcinoma in situ of right breast: Secondary | ICD-10-CM | POA: Diagnosis not present

## 2022-02-11 DIAGNOSIS — Z170 Estrogen receptor positive status [ER+]: Secondary | ICD-10-CM | POA: Insufficient documentation

## 2022-02-11 MED ORDER — TAMOXIFEN CITRATE 20 MG PO TABS: 20.0000 mg | ORAL_TABLET | Freq: Every evening | ORAL | 3 refills | Status: DC

## 2022-02-11 NOTE — Telephone Encounter (Signed)
Called patient and left vm, pt needs face to face with wright in December

## 2022-02-11 NOTE — Progress Notes (Signed)
Patient Care Team: Elsie Stain, MD as PCP - General (Pulmonary Disease) Nicholas Lose, MD as PCP - Hematology/Oncology (Hematology and Oncology) Services, [facility] as Referring Physician Christen Butter, MD as Referring Physician (Gynecology) Melida Quitter, MD as Consulting Physician (Otolaryngology) Loretta Plume as Consulting Physician (Neurosurgery)  DIAGNOSIS:  Encounter Diagnosis  Name Primary?   Ductal carcinoma in situ (DCIS) of right breast Yes    SUMMARY OF ONCOLOGIC HISTORY: Oncology History  Ductal carcinoma in situ (DCIS) of right breast  06/29/2019 Initial Diagnosis   Right breast biopsy: Fibrocystic changes, pseudoangiomatous stromal hyperplasia   07/21/2019 Surgery   Right lumpectomy: Dr. Lucia Gaskins: Intermediate grade DCIS 0.2 cm, 0.1 cm from anterior margin, LCIS, ER 95%, PR 30% Tis NX stage 0   07/21/2019 Cancer Staging   Staging form: Breast, AJCC 8th Edition - Pathologic stage from 07/21/2019: Stage 0 (pTis (DCIS), pN0, cM0, ER+, PR+) - Signed by Gardenia Phlegm, NP on 08/03/2019   07/05/2020 -  Anti-estrogen oral therapy   10 mg of tamoxifen daily      CHIEF COMPLIANT: Right breast DCIS  INTERVAL HISTORY: Catherine Olsen is a 51 year old with above-mentioned history of DCIS status postlumpectomy and had been on tamoxifen therapy. She states that she has been doing ok. She had some concern about her eye color changing. She says her vision has been getting blurry. She is tolerating the tamoxifen extremely well.   ALLERGIES:  is allergic to other, prednisone, sulfamethoxazole, oxycodone, abilify [aripiprazole], celebrex [celecoxib], misc. sulfonamide containing compounds, molds & smuts, penicillins, tylenol [acetaminophen], lamictal [lamotrigine], percocet [oxycodone-acetaminophen], sulfa antibiotics, and sulfites.  MEDICATIONS:  Current Outpatient Medications  Medication Sig Dispense Refill   albuterol (VENTOLIN HFA) 108 (90 Base) MCG/ACT  inhaler Inhale 2 puffs into the lungs every 4 (four) hours as needed for wheezing or shortness of breath. 18 g 1   clonazePAM (KLONOPIN) 0.5 MG tablet Take 0.5 mg by mouth at bedtime.     divalproex (DEPAKOTE ER) 500 MG 24 hr tablet Take 500 mg by mouth at bedtime.     EPINEPHrine 0.3 mg/0.3 mL IJ SOAJ injection Inject 0.3 mg into the skin as needed for anaphylaxis. 1 each 0   ibuprofen (ADVIL) 800 MG tablet TAKE 1 TABLET(800 MG) BY MOUTH EVERY 8 HOURS AS NEEDED 90 tablet 0   tamoxifen (NOLVADEX) 20 MG tablet Take 1 tablet (20 mg total) by mouth every evening. For breast cancer 90 tablet 3   traZODone (DESYREL) 100 MG tablet Take 200 mg by mouth at bedtime.     tretinoin (RETIN-A) 0.025 % cream Apply 1 Application topically at bedtime. Apply to affected areas at bedtime 45 g 0   VRAYLAR 1.5 MG capsule Take 1.5 mg by mouth daily.     No current facility-administered medications for this visit.    PHYSICAL EXAMINATION: ECOG PERFORMANCE STATUS: 1 - Symptomatic but completely ambulatory  Vitals:   02/11/22 1337  BP: 122/88  Pulse: 92  Resp: 18  Temp: 97.8 F (36.6 C)  SpO2: 99%   Filed Weights   02/11/22 1337  Weight: 132 lb 3.2 oz (60 kg)      LABORATORY DATA:  I have reviewed the data as listed    Latest Ref Rng & Units 10/27/2021    1:48 PM 09/28/2021   10:22 PM 05/16/2021   11:47 AM  CMP  Glucose 70 - 99 mg/dL 97  114  85   BUN 6 - 20 mg/dL 17  39  10   Creatinine 0.44 - 1.00 mg/dL 0.69  0.79  0.69   Sodium 135 - 145 mmol/L 138  141  140   Potassium 3.5 - 5.1 mmol/L 3.9  4.2  3.9   Chloride 98 - 111 mmol/L 106  108  103   CO2 22 - 32 mmol/L '26  26  24   '$ Calcium 8.9 - 10.3 mg/dL 8.7  9.6  9.7   Total Protein 6.5 - 8.1 g/dL  7.0  6.2   Total Bilirubin 0.3 - 1.2 mg/dL  0.3  0.3   Alkaline Phos 38 - 126 U/L  80  76   AST 15 - 41 U/L  19  16   ALT 0 - 44 U/L  16  9     Lab Results  Component Value Date   WBC 14.2 (H) 10/27/2021   HGB 14.0 10/27/2021   HCT 40.6  10/27/2021   MCV 96.0 10/27/2021   PLT 234 10/27/2021   NEUTROABS 11.4 (H) 10/27/2021    ASSESSMENT & PLAN:  Ductal carcinoma in situ (DCIS) of right breast Right lumpectomy: Dr. Lucia Gaskins: Intermediate grade DCIS 0.2 cm, 0.1 cm from anterior margin, LCIS, ER 95%, PR 30% Tis NX stage 0  07/06/20: Right Lumpectomy: 0.2 cm IG DCIS Margins Neg, ER /PR Positive Treatment plan: Adjuvant radiation therapy: Patient discussed with Dr. Sondra Come and decided against doing radiation.  ___________________________________________________________________________ Maxillofacial CT scan 11/22/2019: Normal  Dr. Dorethea Clan is her psychiatrist   Breast Cancer Surveillance: Mammogram 10/26/2019: Benign breast density category D Breast MRI 06/29/2020: Interval 5.6 x 3.6 x 2.9 cm area of low-grade ductal enhancement in the upper half of right breast.   Patient decided not to undergo surgery for this and is currently on tamoxifen.   Tamoxifen Toxicities:None   Hospitalization: 01/04/2021-01/10/2021: Mental health crisis  Mammogram 05/08/2021: Benign  Breast MRI 12/23/2021: Recommendation is for MRI guided core biopsies of the non-mass enhancement in the right breast.  Since patient declines additional biopsies and repeat MRI in 6 months is suggested.  Return to clinic in 6 months for follow-up   Orders Placed This Encounter  Procedures   MR BREAST BILATERAL [facility] [place] [facility]    Standing Status:   Future    Standing Expiration Date:   02/12/2023    Order Specific Question:   If indicated for the ordered procedure, I authorize the administration of contrast media per Radiology protocol    Answer:   Yes    Order Specific Question:   What is the patient's sedation requirement?    Answer:   No Sedation    Order Specific Question:   Does the patient have a pacemaker or implanted devices?    Answer:   No    Order Specific Question:   Preferred imaging location?    Answer:   [facility] (table limit-550lbs)   The  patient has a good understanding of the overall plan. she agrees with it. she will call with any problems that may develop before the next visit here. Total time spent: 30 mins including face to face time and time spent for planning, charting and co-ordination of care   Harriette Ohara, MD 02/11/22    I Gardiner Coins am scribing for Dr. Lindi Adie  I have reviewed the above documentation for accuracy and completeness, and I agree with the above.

## 2022-02-11 NOTE — Assessment & Plan Note (Signed)
Right lumpectomy: Dr. Lucia Gaskins: Intermediate grade DCIS 0.2 cm, 0.1 cm from anterior margin, LCIS, ER 95%, PR 30% Tis NX stage 0  07/06/20: Right Lumpectomy: 0.2 cm IG DCIS Margins Neg, ER /PR Positive Treatment plan: Adjuvant radiation therapy: Patient discussed with Dr. Sondra Come and decided against doing radiation.  ___________________________________________________________________________ Maxillofacial CT scan 11/22/2019: Normal  Dr. Dorethea Clan is her psychiatrist   Breast Cancer Surveillance: Mammogram 10/26/2019: Benign breast density category D Breast MRI 06/29/2020: Interval 5.6 x 3.6 x 2.9 cm area of low-grade ductal enhancement in the upper half of right breast.   Patient decided not to undergo surgery for this and is currently on tamoxifen.   Tamoxifen Toxicities:None   Hospitalization: 01/04/2021-01/10/2021: Mental health crisis  Mammogram 05/08/2021: Benign  Breast MRI 12/23/2021: Recommendation is for MRI guided core biopsies of the non-mass enhancement in the right breast.  Since patient declines additional biopsies and repeat MRI in 6 months is suggested.  Return to clinic in 6 months for follow-up

## 2022-02-12 ENCOUNTER — Telehealth: Payer: Self-pay | Admitting: Hematology and Oncology

## 2022-02-12 NOTE — Telephone Encounter (Signed)
Scheduled appointment per 11/7 los. Patient is aware.

## 2022-03-13 ENCOUNTER — Encounter: Payer: Self-pay | Admitting: Hematology and Oncology

## 2022-03-14 ENCOUNTER — Other Ambulatory Visit: Payer: Self-pay

## 2022-03-14 ENCOUNTER — Inpatient Hospital Stay: Payer: Medicare Other | Attending: Hematology and Oncology | Admitting: Adult Health

## 2022-03-14 VITALS — BP 119/95 | HR 100 | Temp 98.1°F | Resp 17 | Wt 135.4 lb

## 2022-03-14 DIAGNOSIS — C50412 Malignant neoplasm of upper-outer quadrant of left female breast: Secondary | ICD-10-CM | POA: Diagnosis not present

## 2022-03-14 DIAGNOSIS — Z170 Estrogen receptor positive status [ER+]: Secondary | ICD-10-CM | POA: Insufficient documentation

## 2022-03-14 DIAGNOSIS — D0511 Intraductal carcinoma in situ of right breast: Secondary | ICD-10-CM | POA: Diagnosis not present

## 2022-03-14 DIAGNOSIS — N63 Unspecified lump in unspecified breast: Secondary | ICD-10-CM

## 2022-03-14 DIAGNOSIS — Z79810 Long term (current) use of selective estrogen receptor modulators (SERMs): Secondary | ICD-10-CM | POA: Diagnosis not present

## 2022-03-18 ENCOUNTER — Other Ambulatory Visit: Payer: Self-pay | Admitting: Adult Health

## 2022-03-18 ENCOUNTER — Ambulatory Visit
Admission: RE | Admit: 2022-03-18 | Discharge: 2022-03-18 | Disposition: A | Discharge status: Home / Self Care | Payer: Medicare Other | Source: Ambulatory Visit | Attending: Adult Health | Admitting: Adult Health

## 2022-03-18 ENCOUNTER — Encounter: Payer: Self-pay | Admitting: Adult Health

## 2022-03-18 DIAGNOSIS — N63 Unspecified lump in unspecified breast: Secondary | ICD-10-CM

## 2022-03-18 DIAGNOSIS — N6311 Unspecified lump in the right breast, upper outer quadrant: Secondary | ICD-10-CM | POA: Diagnosis not present

## 2022-03-18 DIAGNOSIS — Z170 Estrogen receptor positive status [ER+]: Secondary | ICD-10-CM

## 2022-03-18 NOTE — Assessment & Plan Note (Signed)
Catherine Olsen is a 51 year old woman with history of right breast DCIS status postlumpectomy and urine is a 51 year old woman with history of right breast DCIS status postlumpectomy and adjuvant antiestrogen therapy with tamoxifen.  Adjuvant antiestrogen therapy with tamoxifen.  She has right breast nodularity next to her lumpectomy site.  It is unclear if this is related to the non-mass enhancement that was previously seen on her MRI few months ago.  I discussed with her that we should do a mammogram and ultrasound to further evaluate and determine if the areas seen in the MRI were getting worse.    Bided education to [name] that there are different types of breast cancer and not all breast cancer response to tamoxifen therefore it is important to get to the bottom of the type of cancer and whether or not that is what is going on in her breast.  She verbalized understanding of this and initially the breast center was only able to accommodate her on December 21, however I reached out to their lead radiologist and chief operating officer explaining her mental health background and they helped expedite that appointment to December 12.  Parents follow-up with Dr. Lindi Adie will occur in March 2024.  Coached base with her based on the results of her mammogram and ultrasound.

## 2022-03-18 NOTE — Progress Notes (Signed)
[facility] Follow up:    Catherine Stain, MD [address] [place] [address]   DIAGNOSIS:  Cancer Staging  Ductal carcinoma in situ (DCIS) of right breast Staging form: Breast, AJCC 8th Edition - Pathologic stage from 07/21/2019: Stage 0 (pTis (DCIS), pN0, cM0, ER+, PR+) - Signed by Gardenia Phlegm, NP on 08/03/2019 Stage prefix: Initial diagnosis   SUMMARY OF ONCOLOGIC HISTORY: Oncology History  Ductal carcinoma in situ (DCIS) of right breast  06/29/2019 Initial Diagnosis   Right breast biopsy: Fibrocystic changes, pseudoangiomatous stromal hyperplasia   07/21/2019 Surgery   Right lumpectomy: Dr. Lucia Gaskins: Intermediate grade DCIS 0.2 cm, 0.1 cm from anterior margin, LCIS, ER 95%, PR 30% Tis NX stage 0   07/21/2019 Cancer Staging   Staging form: Breast, AJCC 8th Edition - Pathologic stage from 07/21/2019: Stage 0 (pTis (DCIS), pN0, cM0, ER+, PR+) - Signed by Gardenia Phlegm, NP on 08/03/2019   07/05/2020 -  Anti-estrogen oral therapy   10 mg of tamoxifen daily      CURRENT THERAPY: Tamoxifen daily  INTERVAL HISTORY: Catherine Olsen 51 y.o. female returns for follow-up due to a right breast lump.  Of note she is status post breast MRI in September 2023 where MRI guided core biopsies of non-mass enhancement in the upper right breast were recommended.  She opted to forego those biopsies.  She continues on tamoxifen and told me that if it was cancer tamoxifen would treat it.  She notes that since she has been feeling these new areas in her right breast that she has had difficulty resisting the urge to touch them repeatedly.  She notes that this has been severe in the past leading to inflammation around the nodular area.  She wants to know if she can have imaging done soon as possible so that her messing with them does not continue to bother the area like it did last time.   Patient Active Problem List   Diagnosis Date Noted    Therapeutic drug monitoring 05/16/2021   Atherosclerosis 05/16/2021   Anorexia 05/16/2021   Weight loss 05/16/2021   Bipolar affective disorder, mixed, severe, with psychotic behavior ([place]) 01/05/2021   Cervical spondylosis 10/16/2020   Bipolar 1 disorder, depressed, severe ([place]) 08/12/2020   Breast cancer ([place])    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   Cocaine abuse ([place]) 09/08/2018   History of suicidal ideation 09/08/2018   Other allergic rhinitis 02/02/2018   Allergy, unspecified, subsequent encounter 02/02/2018   Mild intermittent asthma without complication 81/19/1478   Angio-edema 02/02/2018   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   Gastroesophageal reflux disease without esophagitis 02/02/2018   Chronic dental pain 01/20/2018   Chronic facial pain 01/20/2018   Environmental allergies 10/29/2017   Pain in joint of left shoulder 07/15/2017   Neck pain 07/15/2017   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   Chronic migraine without aura without status migrainosus, not intractable 03/24/2017   Chronic midline low back pain without sciatica 03/24/2017   Pulmonary emphysema ([place]) 02/19/2017   Jaw pain 02/11/2017   Tobacco use disorder 12/09/2016   Exertional dyspnea 12/09/2016   Family history of alpha 1 antitrypsin deficiency 09/04/2016   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   Nodule of left lung 09/04/2016   Cervical lymphadenopathy 01/29/2016   Perimenopausal vasomotor symptoms 05/25/2015   Chronic rhinitis 04/18/2015   Pituitary microadenoma ([place]) 05/19/2014   Endometriosis of ovary 04/03/2014   Ovarian cyst, right 04/03/2014  Posttraumatic stress disorder 09/13/2012   Fibrocystic breast disease 08/24/2012   Myalgia and myositis 07/26/2012   Depression 07/26/2012   PTSD (post-traumatic stress disorder) 01/09/2009   Insomnia 01/17/2008   NEVI, MULTIPLE 07/17/2006   KERATOSIS, SEBORRHEIC NEC 07/17/2006   ACNE NEC 07/17/2006    is allergic to  other, prednisone, sulfamethoxazole, oxycodone, abilify [aripiprazole], celebrex [celecoxib], misc. sulfonamide containing compounds, molds & smuts, penicillins, tylenol [acetaminophen], lamictal [lamotrigine], percocet [oxycodone-acetaminophen], sulfa antibiotics, and sulfites.  MEDICAL HISTORY: Past Medical History:  Diagnosis Date   Abnormal uterine bleeding (AUB) 06/25/2009   Qualifier: Diagnosis of  By: Cathren Laine MD, Ankit     Allergy    Angio-edema    Anxiety    Arthritis    hands, lower back, knees   Asthma    Bipolar 1 disorder ([place])    Breast cancer ([place])    stage 0   Chronic prescription benzodiazepine use 09/08/2018   COPD (chronic obstructive pulmonary disease) ([facility])    Depression    Endometriosis    Fibromyalgia    GERD (gastroesophageal reflux disease)    Headache(784.0)    otc med prn   Migraine    Pituitary tumor    microadenoma   PONV (postoperative nausea and vomiting)    PTSD (post-traumatic stress disorder)    Scoliosis    Suicidal ideation 01/05/2021   Termination of pregnancy    x 2 at age 76 and 51 yrs old   Tobacco abuse 03/04/2015   Urticaria     SURGICAL HISTORY: Past Surgical History:  Procedure Laterality Date   BREAST BIOPSY Right 05/19/2007   BREAST BIOPSY Right 06/02/2007   BREAST BIOPSY  06/29/2019   BREAST EXCISIONAL BIOPSY     BREAST LUMPECTOMY Right 07/21/2019   BREAST LUMPECTOMY WITH RADIOACTIVE SEED LOCALIZATION Right 07/21/2019   Procedure: RIGHT BREAST LUMPECTOMY WITH RADIOACTIVE SEED LOCALIZATION;  Surgeon: Alphonsa Overall, MD;  Location: [facility];  Service: General;  Laterality: Right;   BREAST SURGERY     DILATION AND CURETTAGE OF UTERUS     FACIAL COSMETIC SURGERY     right cheek   LAPAROSCOPY Right 11/11/2013   Procedure: LAPAROSCOPY OPERATIVE with  Drainage of  RIGHT Ovarian ENDOMETRIOMA;  Surgeon: Elveria Royals, MD;  Location: [facility];  Service: Gynecology;  Laterality: Right;   NASAL ENDOSCOPY     said it showed  some acid reflux from ENT   NOSE SURGERY     rhinoplasty at age 67 yrs   35 TOOTH EXTRACTION      SOCIAL HISTORY: Social History   Socioeconomic History   Marital status: Divorced    Spouse name: Not on file   Number of children: 0   Years of education: Not on file   Highest education level: Bachelor's degree (e.g., BA, AB, BS)  Occupational History   Occupation: disability    Comment: mental illness  Tobacco Use   Smoking status: Every Day    Packs/day: 0.50    Years: 29.00    Total pack years: 14.50    Types: Cigarettes    Last attempt to quit: 10/08/2019    Years since quitting: 2.4   Smokeless tobacco: Never  Vaping Use   Vaping Use: Former  Substance and Sexual Activity   Alcohol use: Not Currently    Alcohol/week: 2.0 standard drinks of alcohol    Types: 2 Cans of beer per week    Comment: 6 nights   Drug use: Not Currently  Types: "Crack" cocaine    Comment: 07/19/2019   Sexual activity: Not Currently    Comment: abortion at 62 and 79yr. of age   Other Topics Concern   Not on file  Social History Narrative   Marital status: divorced; not dating      Children: none      Lives: alone with mom      Employment: disability for mental illness in 157  Hospitalizations x 9 in past.      Tobacco: 1 ppd x since age 51 Decreasing in 2018.      Alcohol: socially; beers.      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      Exercise: yoga daily; exercise daily.      ADLs: independent with ADLs; no car; depends on others for transportation.      Patient is right-handed. She is divorced and lives with her mother. She drinks 2-3 cups of 1/2 caffeine coffe a day. She walks occasionally for exercise.   Social Determinants of HRadio broadcast assistantStrain: Not on file  Food Insecurity: Not on file  Transportation Needs: Not on file  Physical Activity: Not on file  Stress: Not on file  Social Connections: Not on file  Intimate Partner  Violence: Not on file    FAMILY HISTORY: Family History  Problem Relation Age of Onset   Bipolar disorder Mother    Diabetes Mother    Hypertension Mother    Stroke Mother 658      CVA   Mental illness Mother        no diagnosis; personality disorder   Bipolar disorder Father    Hyperlipidemia Father    Hypertension Father    COPD Father    Alpha-1 antitrypsin deficiency Father    Asthma Father    Alpha-1 antitrypsin deficiency Brother    Asthma Brother    Heart disease Maternal Grandfather    Hyperlipidemia Maternal Grandfather    Hypertension Maternal Grandfather    Asthma Maternal Grandfather    Bipolar disorder Maternal Grandmother    Depression Maternal Grandmother    Diabetes Maternal Grandmother    Heart disease Maternal Grandmother    Hyperlipidemia Maternal Grandmother    Hypertension Maternal Grandmother    Mental illness Maternal Grandmother    Heart disease Paternal Grandfather    Hyperlipidemia Paternal Grandfather    Hypertension Paternal Grandfather    Stroke Paternal Grandfather    Alzheimer's disease Paternal Grandmother    Allergic rhinitis Neg Hx    Angioedema Neg Hx    Eczema Neg Hx    Urticaria Neg Hx    Immunodeficiency Neg Hx    Colon cancer Neg Hx    Esophageal cancer Neg Hx     Review of Systems  Constitutional:  Negative for appetite change, chills, fatigue, fever and unexpected weight change.  HENT:   Negative for hearing loss, lump/mass and trouble swallowing.   Eyes:  Negative for eye problems and icterus.  Respiratory:  Negative for chest tightness, cough and shortness of breath.   Cardiovascular:  Negative for chest pain, leg swelling and palpitations.  Gastrointestinal:  Negative for abdominal distention, abdominal pain, constipation, diarrhea, nausea and vomiting.  Endocrine: Negative for hot flashes.  Genitourinary:  Negative for difficulty urinating.   Musculoskeletal:  Negative for arthralgias.  Skin:  Negative for itching  and rash.  Neurological:  Negative for dizziness, extremity weakness, headaches and numbness.  Hematological:  Negative for  adenopathy. Does not bruise/bleed easily.  Psychiatric/Behavioral:  Negative for depression. The patient is not nervous/anxious.       PHYSICAL EXAMINATION  ECOG PERFORMANCE STATUS: 1 - Symptomatic but completely ambulatory  Vitals:   03/14/22 1510  BP: (!) 119/95  Pulse: 100  Resp: 17  Temp: 98.1 F (36.7 C)  SpO2: 99%    Physical Exam Constitutional:      General: She is not in acute distress.    Appearance: Normal appearance. She is not toxic-appearing.  HENT:     Head: Normocephalic and atraumatic.  Eyes:     General: No scleral icterus. Cardiovascular:     Rate and Rhythm: Normal rate and regular rhythm.     Pulses: Normal pulses.     Heart sounds: Normal heart sounds.  Pulmonary:     Effort: Pulmonary effort is normal.     Breath sounds: Normal breath sounds.  Chest:     Comments: Right breast with thickness and nodule noted in upper central area adjacent to lumpectomy. Abdominal:     General: Abdomen is flat. Bowel sounds are normal. There is no distension.     Palpations: Abdomen is soft.     Tenderness: There is no abdominal tenderness.  Musculoskeletal:        General: No swelling.     Cervical back: Neck supple.  Lymphadenopathy:     Cervical: No cervical adenopathy.  Skin:    General: Skin is warm and dry.     Findings: No rash.  Neurological:     General: No focal deficit present.     Mental Status: She is alert.  Psychiatric:        Mood and Affect: Mood normal.        Behavior: Behavior normal.     LABORATORY DATA:  None for this visit   ASSESSMENT and THERAPY PLAN:   Ductal carcinoma in situ (DCIS) of right breast Artavia is a 51 year old woman with history of right breast DCIS status postlumpectomy and urine is a 51 year old woman with history of right breast DCIS status postlumpectomy and adjuvant antiestrogen  therapy with tamoxifen.  Adjuvant antiestrogen therapy with tamoxifen.  She has right breast nodularity next to her lumpectomy site.  It is unclear if this is related to the non-mass enhancement that was previously seen on her MRI few months ago.  I discussed with her that we should do a mammogram and ultrasound to further evaluate and determine if the areas seen in the MRI were getting worse.    Bided education to [name] that there are different types of breast cancer and not all breast cancer response to tamoxifen therefore it is important to get to the bottom of the type of cancer and whether or not that is what is going on in her breast.  She verbalized understanding of this and initially the breast center was only able to accommodate her on December 21, however I reached out to their lead radiologist and chief operating officer explaining her mental health background and they helped expedite that appointment to December 12.  Parents follow-up with Dr. Lindi Adie will occur in March 2024.  Coached base with her based on the results of her mammogram and ultrasound.  All questions were answered. The patient knows to call the clinic with any problems, questions or concerns. We can certainly see the patient much sooner if necessary.  Total encounter time:30 minutes*in face-to-face visit time, chart review, lab review, care coordination, order entry, and documentation of  the encounter time.    Wilber Bihari, NP 03/18/22 12:13 PM Medical Oncology and Hematology [facility] [place], [place] [address] Tel. [phone]    Fax. [phone]  *Total Encounter Time as defined by the Centers for Medicare and Medicaid Services includes, in addition to the face-to-face time of a patient visit (documented in the note above) non-face-to-face time: obtaining and reviewing outside history, ordering and reviewing medications, tests or procedures, care coordination (communications with  other health care professionals or caregivers) and documentation in the medical record.

## 2022-03-27 ENCOUNTER — Other Ambulatory Visit: Payer: Medicare Other

## 2022-03-31 ENCOUNTER — Emergency Department (HOSPITAL_COMMUNITY)
Admission: EM | Admit: 2022-03-31 | Discharge: 2022-04-01 | Discharge status: Against Medical Advice | Payer: Medicare Other | Attending: Emergency Medicine | Admitting: Emergency Medicine

## 2022-03-31 ENCOUNTER — Other Ambulatory Visit: Payer: Self-pay

## 2022-03-31 ENCOUNTER — Encounter (HOSPITAL_COMMUNITY): Payer: Self-pay

## 2022-03-31 DIAGNOSIS — Y92000 Kitchen of unspecified non-institutional (private) residence as  the place of occurrence of the external cause: Secondary | ICD-10-CM | POA: Insufficient documentation

## 2022-03-31 DIAGNOSIS — Z5329 Procedure and treatment not carried out because of patient's decision for other reasons: Secondary | ICD-10-CM | POA: Diagnosis not present

## 2022-03-31 DIAGNOSIS — S61412A Laceration without foreign body of left hand, initial encounter: Secondary | ICD-10-CM | POA: Diagnosis not present

## 2022-03-31 DIAGNOSIS — W272XXA Contact with scissors, initial encounter: Secondary | ICD-10-CM | POA: Diagnosis not present

## 2022-03-31 DIAGNOSIS — S6992XA Unspecified injury of left wrist, hand and finger(s), initial encounter: Secondary | ICD-10-CM | POA: Diagnosis not present

## 2022-03-31 MED ORDER — TETANUS-DIPHTH-ACELL PERTUSSIS 5-2.5-18.5 LF-MCG/0.5 IM SUSY: 0.5000 mL | PREFILLED_SYRINGE | Freq: Once | INTRAMUSCULAR | Status: DC

## 2022-03-31 MED ORDER — CLINDAMYCIN HCL 150 MG PO CAPS: 150.0000 mg | ORAL_CAPSULE | Freq: Three times a day (TID) | ORAL | 0 refills | Status: AC

## 2022-03-31 NOTE — ED Notes (Signed)
Pt called for triage x2 times with no answer.

## 2022-03-31 NOTE — ED Provider Notes (Signed)
[facility] EMERGENCY DEPARTMENT Provider Note   CSN: [id number] Arrival date & time: 03/31/22  1647     History  Chief Complaint  Patient presents with   Hand Injury    Catherine Olsen is a 51 y.o. female presents to the ED with complaint of pain and swelling to the palm of her left hand after accidentally stabbing her self with a pair of dirty kitchen shears.  She reports that it was not initially swollen, but developed over the last several hours.  She was able to control the bleeding at home.  She is concerned for bacterial infection and needs to update her tetanus shot.  Denies numbness, tingling, joint swelling, weakness in her hand.       Home Medications Prior to Admission medications   Medication Sig Start Date End Date Taking? Authorizing Provider  clindamycin (CLEOCIN) 150 MG capsule Take 1 capsule (150 mg total) by mouth 3 (three) times daily for 5 days. 03/31/22 04/05/22 Yes Caysen Whang R, PA  albuterol (VENTOLIN HFA) 108 (90 Base) MCG/ACT inhaler Inhale 2 puffs into the lungs every 4 (four) hours as needed for wheezing or shortness of breath. 05/16/21   Elsie Stain, MD  clonazePAM (KLONOPIN) 0.5 MG tablet Take 0.5 mg by mouth at bedtime. 08/31/21   [provider]  divalproex (DEPAKOTE ER) 500 MG 24 hr tablet Take 500 mg by mouth at bedtime. 08/10/21   [provider]  EPINEPHrine 0.3 mg/0.3 mL IJ SOAJ injection Inject 0.3 mg into the skin as needed for anaphylaxis. 05/16/21   Elsie Stain, MD  ibuprofen (ADVIL) 800 MG tablet TAKE 1 TABLET(800 MG) BY MOUTH EVERY 8 HOURS AS NEEDED 02/10/22   Charlott Rakes, MD  tamoxifen (NOLVADEX) 20 MG tablet Take 1 tablet (20 mg total) by mouth every evening. For breast cancer 02/11/22   Nicholas Lose, MD  traZODone (DESYREL) 100 MG tablet Take 200 mg by mouth at bedtime. 04/08/21   [provider]  tretinoin (RETIN-A) 0.025 % cream Apply 1 Application topically at bedtime. Apply to affected  areas at bedtime 10/06/21   Elsie Stain, MD  VRAYLAR 1.5 MG capsule Take 1.5 mg by mouth daily. 04/21/21   [provider]      Allergies    Other, Prednisone, Sulfamethoxazole, Oxycodone, Abilify [aripiprazole], Celebrex [celecoxib], Misc. sulfonamide containing compounds, Molds & smuts, Penicillins, Tylenol [acetaminophen], Lamictal [lamotrigine], Percocet [oxycodone-acetaminophen], Sulfa antibiotics, and Sulfites    Review of Systems   Review of Systems  Musculoskeletal:  Negative for joint swelling.       Left hand pain  Neurological:  Negative for weakness and numbness.    Physical Exam Updated Vital Signs BP 120/81   Pulse 92   Temp 98.2 F (36.8 C) (Oral)   Resp 16   LMP 11/21/2014   SpO2 97%  Physical Exam Vitals and nursing note reviewed.  Constitutional:      General: She is not in acute distress.    Appearance: Normal appearance. She is not ill-appearing or diaphoretic.  Cardiovascular:     Rate and Rhythm: Normal rate and regular rhythm.  Pulmonary:     Effort: Pulmonary effort is normal.  Musculoskeletal:     Right hand: Normal.     Left hand: Swelling, laceration and tenderness present. No bony tenderness. Decreased range of motion. Normal capillary refill. Normal pulse.     Comments: Swelling to the palm and proximal portion of the third finger on the left  hand.  ROM is mildly limited by swelling and.  There is no active bleeding, there appears to be some ecchymosis at the wound site.  There is tenderness to palpation to this area.  She is neurovascularly intact.  Neurological:     Mental Status: She is alert. Mental status is at baseline.  Psychiatric:        Mood and Affect: Mood normal.        Behavior: Behavior normal.     ED Results / Procedures / Treatments   Labs (all labs ordered are listed, but only abnormal results are displayed) Labs Reviewed - No data to display  EKG None  Radiology No results found.  Procedures Procedures     Medications Ordered in ED Medications  Tdap (BOOSTRIX) injection 0.5 mL (has no administration in time range)    ED Course/ Medical Decision Making/ A&P                           Medical Decision Making Risk Prescription drug management.   This patient presents to the ED with chief complaint(s) of left hand injury with dirty kitchen shears.  Patient is concerned due to part of her hand and finger swelling and is very tender to touch.  She also reports increased warmth.  This injury occurred several hours ago.  She was able to control the bleeding at home.  She reports she does need to update her tetanus shot and is concerned for bacterial infection in her hand.  The initial plan is to obtain x-ray of the left hand to rule out bony injury, however, patient is refusing to have an x-ray done at this time.  She states that she is "confident that it did not strike bone" and does not wish to wait for an x-ray to be done.    Additional history obtained: None  Initial Assessment:   On exam, left hand on the palmar aspect has mild swelling and ecchymosis to the hand.  ROM is mildly limited due to swelling.  Normal capillary refill and sensation to the palm and all digits.  Mild swelling and tenderness to palpation of left third digit.  No erythema or increased warmth.  Normal radial pulse.   Independent ECG/labs interpretation:  The following labs were independently interpreted:  Not indicated  Independent visualization and interpretation of imaging: I independently visualized the following imaging with scope of interpretation limited to determining acute life threatening conditions related to emergency care: Pt refused x-ray  Treatment and Reassessment: Pt Tdap was updated today.  Will put patient on prophylactic antibiotic due to concern for infection from dirty kitchen shears.   Other treatment options considered:   N/A  Disposition:   Informed by tech in waiting room that  patient has left prior to her visit being completed.  Antibiotic was already sent to pharmacy for prophylactic treatment of wound on hand.  Patient did not receive Tdap shot.  Was unfortunately unable to complete visit while in triage due to higher acuity patients needing the available rooms.            Final Clinical Impression(s) / ED Diagnoses Final diagnoses:  Injury of left hand, initial encounter    Rx / DC Orders ED Discharge Orders          Ordered    clindamycin (CLEOCIN) 150 MG capsule  3 times daily        03/31/22 1838  Pat Kocher, [place] 03/31/22 2036    Godfrey Pick, MD 03/31/22 [phone]

## 2022-03-31 NOTE — ED Triage Notes (Signed)
Pt states she accidentally poked the palm of her left hand with a pair of scissors earlier today. Cms intact. Not utd on tetanus.

## 2022-04-09 ENCOUNTER — Telehealth: Payer: Self-pay

## 2022-04-09 NOTE — Telephone Encounter (Signed)
     Patient  visit on 12/26  at [facility]   Patient is going to follow up with PCP but not at this time due to a death in her family and other issues. She chose not to get her antibiotics filled she said she didnt think she needed it.   Have you been able to follow up with your primary care physician? no  The patient was or was not able to obtain any needed medicine or equipment.no  Are there diet recommendations that you are having difficulty following? na  Patient expresses understanding of discharge instructions and education provided has no other needs at this time. Yes      [place], [facility], Care Management  [phone] [address], [place], [place] [address] Phone: [phone] Email: [email or web address]

## 2022-05-06 ENCOUNTER — Encounter: Payer: Self-pay | Admitting: Hematology and Oncology

## 2022-05-08 ENCOUNTER — Ambulatory Visit
Admission: RE | Admit: 2022-05-08 | Discharge: 2022-05-08 | Disposition: A | Discharge status: Home / Self Care | Payer: Medicare Other | Source: Ambulatory Visit | Attending: Adult Health | Admitting: Adult Health

## 2022-05-08 DIAGNOSIS — Z170 Estrogen receptor positive status [ER+]: Secondary | ICD-10-CM

## 2022-05-08 DIAGNOSIS — R922 Inconclusive mammogram: Secondary | ICD-10-CM | POA: Diagnosis not present

## 2022-05-08 DIAGNOSIS — N63 Unspecified lump in unspecified breast: Secondary | ICD-10-CM

## 2022-05-08 DIAGNOSIS — N6315 Unspecified lump in the right breast, overlapping quadrants: Secondary | ICD-10-CM | POA: Diagnosis not present

## 2022-05-09 ENCOUNTER — Other Ambulatory Visit: Payer: Self-pay | Admitting: Adult Health

## 2022-05-09 DIAGNOSIS — N6001 Solitary cyst of right breast: Secondary | ICD-10-CM

## 2022-06-04 ENCOUNTER — Ambulatory Visit: Payer: Medicare Other | Admitting: Physician Assistant

## 2022-06-07 NOTE — Progress Notes (Deleted)
Annual Wellness Visit     Patient: Catherine Olsen, Female    DOB: 1970/09/21, 52 y.o.   MRN: [id number]  Subjective  No chief complaint on file.   Catherine Olsen is a 52 y.o. female who presents today for her Annual Wellness Visit. She reports consuming a {diet types:17450} diet. {Exercise:19826} She generally feels {well/fairly well/poorly:18703}. She reports sleeping {well/fairly well/poorly:18703}. She {does/does not:200015} have additional problems to discuss today.   HPI  {VISON DENTAL STD PSA (Optional):27386}   {History (Optional):23778}  Medications: Outpatient Medications Prior to Visit  Medication Sig   albuterol (VENTOLIN HFA) 108 (90 Base) MCG/ACT inhaler Inhale 2 puffs into the lungs every 4 (four) hours as needed for wheezing or shortness of breath.   clonazePAM (KLONOPIN) 0.5 MG tablet Take 0.5 mg by mouth at bedtime.   divalproex (DEPAKOTE ER) 500 MG 24 hr tablet Take 500 mg by mouth at bedtime.   EPINEPHrine 0.3 mg/0.3 mL IJ SOAJ injection Inject 0.3 mg into the skin as needed for anaphylaxis.   ibuprofen (ADVIL) 800 MG tablet TAKE 1 TABLET(800 MG) BY MOUTH EVERY 8 HOURS AS NEEDED   tamoxifen (NOLVADEX) 20 MG tablet Take 1 tablet (20 mg total) by mouth every evening. For breast cancer   traZODone (DESYREL) 100 MG tablet Take 200 mg by mouth at bedtime.   tretinoin (RETIN-A) 0.025 % cream Apply 1 Application topically at bedtime. Apply to affected areas at bedtime   VRAYLAR 1.5 MG capsule Take 1.5 mg by mouth daily.   No facility-administered medications prior to visit.    Allergies  Allergen Reactions   Other Anaphylaxis    Idiopathic (possible binder or preservatives in medication)     Prednisone Anaphylaxis    Steroids Steroids ( can take with benadryl )   Can not take higher than 40 mg    Sulfamethoxazole Anaphylaxis   Oxycodone Other (See Comments)    Severe blindness?   Abilify [Aripiprazole]     Generic Abilify--causes severe neurological problems.  Can use name brand   Celebrex [Celecoxib]    Misc. Sulfonamide Containing Compounds    Molds & Smuts Other (See Comments)    Environmental   Penicillins Hives    Has patient had a PCN reaction causing immediate rash, facial/tongue/throat swelling, SOB or lightheadedness with hypotension: no Has patient had a PCN reaction causing severe rash involving mucus membranes or skin necrosis: NO Has patient had a PCN reaction that required hospitalizationNO Has patient had a PCN reaction occurring within the last 10 years:NO If all of the above answers are "NO", then may proceed with Cephalosporin use.    Tylenol [Acetaminophen]     Stomach pain   Lamictal [Lamotrigine] Rash   Percocet [Oxycodone-Acetaminophen]     Vision loss   Sulfa Antibiotics Hives    Pt states she is not really allergic to sulfa, but to sulfites.   Sulfites Other (See Comments)    unknown    Patient Care Team: Elsie Stain, MD as PCP - General (Pulmonary Disease) Nicholas Lose, MD as PCP - Hematology/Oncology (Hematology and Oncology) Services, [facility] as Referring Physician Christen Butter, MD as Referring Physician (Gynecology) Melida Quitter, MD as Consulting Physician (Otolaryngology) Loretta Plume as Consulting Physician (Neurosurgery)  ROS      Objective  LMP 11/21/2014  {Vitals History (Optional):23777}  Physical Exam    Most recent functional status assessment:     No data to display  Most recent fall risk assessment:    05/16/2021   11:23 AM  [place] in the past year? 0  Number falls in past yr: 0  Injury with Fall? 0  Risk for fall due to : No Fall Risks    Most recent depression screenings:    01/04/2021    3:14 AM 12/11/2020    4:02 PM  PHQ 2/9 Scores  PHQ - 2 Score 5   PHQ- 9 Score 21      Information is confidential and restricted. Go to Review Flowsheets to unlock data.   Most recent cognitive screening:     No data to display          Most recent Audit-C alcohol use screening    01/04/2021    8:49 PM  Alcohol Use Disorder Test (AUDIT)  Patient refused Alcohol Screening Tool      Information is confidential and restricted. Go to Review Flowsheets to unlock data.   A score of 3 or more in women, and 4 or more in men indicates increased risk for alcohol abuse, EXCEPT if all of the points are from question 1   Vision/Hearing Screen: No results found.  {Labs (Optional):23779}  No results found for any visits on 06/10/22.    Assessment & Plan   Annual wellness visit done today including the all of the following: Reviewed patient's Family Medical History Reviewed and updated list of patient's medical providers Assessment of cognitive impairment was done Assessed patient's functional ability Established a written schedule for health screening [place] Completed and Reviewed  Exercise Activities and Dietary recommendations  Goals   None     There is no immunization history for the selected administration types on file for this patient.  Health Maintenance  Topic Date Due   DTaP/Tdap/Td (1 - Tdap) Never done   Zoster Vaccines- Shingrix (1 of 2) Never done   Medicare Annual Wellness (AWV)  10/26/2020   INFLUENZA VACCINE  Never done   PAP SMEAR-Modifier  10/27/2022   MAMMOGRAM  05/08/2024   Hepatitis C Screening  Completed   HIV Screening  Completed   HPV VACCINES  Aged Out   COLONOSCOPY (Pts 45-2yr Insurance coverage will need to be confirmed)  Discontinued   COVID-19 Vaccine  Discontinued     Discussed health benefits of physical activity, and encouraged her to engage in regular exercise appropriate for her age and condition.    Problem List Items Addressed This Visit   None   No follow-ups on file.     PAsencion Noble MD

## 2022-06-10 ENCOUNTER — Encounter: Payer: Medicare Other | Admitting: Critical Care Medicine

## 2022-06-10 ENCOUNTER — Telehealth: Payer: Self-pay | Admitting: Emergency Medicine

## 2022-06-10 NOTE — Telephone Encounter (Signed)
Copied from [place] [id number]. Topic: Medicare AWV >> Jun 10, 2022 12:04 PM Dominique A wrote: Reason for CRM: Pt states she forgot she had a Medicare AWV today at 10:50am and is wanting to have her appt rescheduled and would like to come into the office for the visit.  Please call pt back.

## 2022-06-11 NOTE — Telephone Encounter (Signed)
Called patient and left voicemail.

## 2022-06-16 ENCOUNTER — Ambulatory Visit
Admission: RE | Admit: 2022-06-16 | Discharge: 2022-06-16 | Disposition: A | Discharge status: Home / Self Care | Payer: Medicare Other | Source: Ambulatory Visit | Attending: Hematology and Oncology | Admitting: Hematology and Oncology

## 2022-06-16 DIAGNOSIS — D0511 Intraductal carcinoma in situ of right breast: Secondary | ICD-10-CM | POA: Diagnosis not present

## 2022-06-16 MED ORDER — GADOPICLENOL 0.5 MMOL/ML IV SOLN
6.0000 mL | Freq: Once | INTRAVENOUS | Status: AC | PRN
  Administered 2022-06-16: 6 mL via INTRAVENOUS

## 2022-06-17 NOTE — Progress Notes (Signed)
Patient Care Team: Elsie Stain, MD as PCP - General (Pulmonary Disease) Nicholas Lose, MD as PCP - Hematology/Oncology (Hematology and Oncology) Services, [facility] as Referring Physician Christen Butter, MD as Referring Physician (Gynecology) Melida Quitter, MD as Consulting Physician (Otolaryngology) Loretta Plume as Consulting Physician (Neurosurgery)  DIAGNOSIS:  Encounter Diagnosis  Name Primary?   Ductal carcinoma in situ (DCIS) of right breast Yes    SUMMARY OF ONCOLOGIC HISTORY: Oncology History  Ductal carcinoma in situ (DCIS) of right breast  06/29/2019 Initial Diagnosis   Right breast biopsy: Fibrocystic changes, pseudoangiomatous stromal hyperplasia   07/21/2019 Surgery   Right lumpectomy: Dr. Lucia Gaskins: Intermediate grade DCIS 0.2 cm, 0.1 cm from anterior margin, LCIS, ER 95%, PR 30% Tis NX stage 0   07/21/2019 Cancer Staging   Staging form: Breast, AJCC 8th Edition - Pathologic stage from 07/21/2019: Stage 0 (pTis (DCIS), pN0, cM0, ER+, PR+) - Signed by Gardenia Phlegm, NP on 08/03/2019   07/05/2020 -  Anti-estrogen oral therapy   10 mg of tamoxifen daily      CHIEF COMPLIANT: Right breast DCIS   INTERVAL HISTORY: SAANVIKA MCPHAUL is a 52 year old with above-mentioned history of DCIS status postlumpectomy and had been on tamoxifen therapy.  She is tolerating tamoxifen fairly well.  She had a breast MRI that showed remarkable improvement in the non-mass enhancement.  She does have a couple of nodules that were described to him.  Patient is happy to hear that the overall non-mass enhancement .  She does not know what could have caused that to happen.  She thinks she had COVID in January 2024.   ALLERGIES:  is allergic to other, prednisone, sulfamethoxazole, oxycodone, abilify [aripiprazole], celebrex [celecoxib], misc. sulfonamide containing compounds, molds & smuts, penicillins, tylenol [acetaminophen], lamictal [lamotrigine], percocet  [oxycodone-acetaminophen], sulfa antibiotics, and sulfites.  MEDICATIONS:  Current Outpatient Medications  Medication Sig Dispense Refill   albuterol (VENTOLIN HFA) 108 (90 Base) MCG/ACT inhaler Inhale 2 puffs into the lungs every 4 (four) hours as needed for wheezing or shortness of breath. 18 g 1   clonazePAM (KLONOPIN) 0.5 MG tablet Take 0.5 mg by mouth at bedtime.     divalproex (DEPAKOTE ER) 500 MG 24 hr tablet Take 500 mg by mouth at bedtime.     EPINEPHrine 0.3 mg/0.3 mL IJ SOAJ injection Inject 0.3 mg into the skin as needed for anaphylaxis. 1 each 0   ibuprofen (ADVIL) 800 MG tablet TAKE 1 TABLET(800 MG) BY MOUTH EVERY 8 HOURS AS NEEDED 90 tablet 0   tamoxifen (NOLVADEX) 20 MG tablet Take 1 tablet (20 mg total) by mouth every evening. For breast cancer 90 tablet 3   traZODone (DESYREL) 100 MG tablet Take 200 mg by mouth at bedtime.     tretinoin (RETIN-A) 0.025 % cream Apply 1 Application topically at bedtime. Apply to affected areas at bedtime 45 g 0   VRAYLAR 1.5 MG capsule Take 1.5 mg by mouth daily.     No current facility-administered medications for this visit.    PHYSICAL EXAMINATION: ECOG PERFORMANCE STATUS: 1 - Symptomatic but completely ambulatory  Vitals:   06/24/22 1512  BP: 116/83  Pulse: (!) 115  Resp: 18  Temp: 98.2 F (36.8 C)  SpO2: 100%   Filed Weights   06/24/22 1512  Weight: 136 lb 8 oz (61.9 kg)      LABORATORY DATA:  I have reviewed the data as listed    Latest Ref Rng & Units 10/27/2021  1:48 PM 09/28/2021   10:22 PM 05/16/2021   11:47 AM  CMP  Glucose 70 - 99 mg/dL 97  114  85   BUN 6 - 20 mg/dL 17  39  10   Creatinine 0.44 - 1.00 mg/dL 0.69  0.79  0.69   Sodium 135 - 145 mmol/L 138  141  140   Potassium 3.5 - 5.1 mmol/L 3.9  4.2  3.9   Chloride 98 - 111 mmol/L 106  108  103   CO2 22 - 32 mmol/L 26  26  24    Calcium 8.9 - 10.3 mg/dL 8.7  9.6  9.7   Total Protein 6.5 - 8.1 g/dL  7.0  6.2   Total Bilirubin 0.3 - 1.2 mg/dL  0.3  0.3    Alkaline Phos 38 - 126 U/L  80  76   AST 15 - 41 U/L  19  16   ALT 0 - 44 U/L  16  9     Lab Results  Component Value Date   WBC 14.2 (H) 10/27/2021   HGB 14.0 10/27/2021   HCT 40.6 10/27/2021   MCV 96.0 10/27/2021   PLT 234 10/27/2021   NEUTROABS 11.4 (H) 10/27/2021    ASSESSMENT & PLAN:  Ductal carcinoma in situ (DCIS) of right breast Right lumpectomy: Dr. Lucia Gaskins: Intermediate grade DCIS 0.2 cm, 0.1 cm from anterior margin, LCIS, ER 95%, PR 30% Tis NX stage 0  07/06/20: Right Lumpectomy: 0.2 cm IG DCIS Margins Neg, ER /PR Positive Treatment plan: Adjuvant radiation therapy: Patient discussed with Dr. Sondra Come and decided against doing radiation.  ___________________________________________________________________________ Maxillofacial CT scan 11/22/2019: Normal  Dr. Dorethea Clan is her psychiatrist   Breast Cancer Surveillance: Mammogram 10/26/2019: Benign breast density category D Breast MRI 06/29/2020: Interval 5.6 x 3.6 x 2.9 cm area of low-grade ductal enhancement in the upper half of right breast.   Patient decided not to undergo surgery for this and is currently on tamoxifen.   Tamoxifen Toxicities:None   Hospitalization: 01/04/2021-01/10/2021: Mental health crisis  Mammogram 05/08/2021: Benign  Breast MRI 06/17/2022: Improving enhancement of the UIQ and UOQ right breast.  Residual small non-mass enhancement 1.7 x 1 cm (previously 5.6 x 2.9 cm), additional 5 to 6 mm enhancing masses in the right breast stable  Will consider doing annual breast MRIs. Breast ultrasound has been requested for August 2024. February 2025 she will undergo contrast-enhanced mammograms. Return to clinic in 1 year for follow-up    Orders Placed This Encounter  Procedures   MM DIAG BREAST TOMO BILATERAL    Standing Status:   Future    Standing Expiration Date:   06/24/2023    Scheduling Instructions:     Contrast mammogram    Order Specific Question:   Reason for Exam (SYMPTOM  OR DIAGNOSIS REQUIRED)     Answer:   contrast enhanced mammograms    Order Specific Question:   Is the patient pregnant?    Answer:   Yes    Order Specific Question:   Preferred imaging location?    Answer:   [facility]    Order Specific Question:   Release to patient    Answer:   Immediate   The patient has a good understanding of the overall plan. she agrees with it. she will call with any problems that may develop before the next visit here. Total time spent: 30 mins including face to face time and time spent for planning, charting and co-ordination of care  Harriette Ohara, MD 06/24/22    I Gardiner Coins am acting as a Education administrator for [employer]  I have reviewed the above documentation for accuracy and completeness, and I agree with the above.

## 2022-06-24 ENCOUNTER — Inpatient Hospital Stay: Payer: Medicare Other | Attending: Hematology and Oncology | Admitting: Hematology and Oncology

## 2022-06-24 ENCOUNTER — Other Ambulatory Visit: Payer: Self-pay

## 2022-06-24 VITALS — BP 116/83 | HR 115 | Temp 98.2°F | Resp 18 | Ht 68.0 in | Wt 136.5 lb

## 2022-06-24 DIAGNOSIS — D0511 Intraductal carcinoma in situ of right breast: Secondary | ICD-10-CM | POA: Diagnosis not present

## 2022-06-24 DIAGNOSIS — Z170 Estrogen receptor positive status [ER+]: Secondary | ICD-10-CM | POA: Diagnosis not present

## 2022-06-24 DIAGNOSIS — Z79810 Long term (current) use of selective estrogen receptor modulators (SERMs): Secondary | ICD-10-CM | POA: Insufficient documentation

## 2022-06-24 NOTE — Assessment & Plan Note (Signed)
Right lumpectomy: Dr. Lucia Gaskins: Intermediate grade DCIS 0.2 cm, 0.1 cm from anterior margin, LCIS, ER 95%, PR 30% Tis NX stage 0  07/06/20: Right Lumpectomy: 0.2 cm IG DCIS Margins Neg, ER /PR Positive Treatment plan: Adjuvant radiation therapy: Patient discussed with Dr. Sondra Come and decided against doing radiation.  ___________________________________________________________________________ Maxillofacial CT scan 11/22/2019: Normal  Dr. Dorethea Clan is her psychiatrist   Breast Cancer Surveillance: Mammogram 10/26/2019: Benign breast density category D Breast MRI 06/29/2020: Interval 5.6 x 3.6 x 2.9 cm area of low-grade ductal enhancement in the upper half of right breast.   Patient decided not to undergo surgery for this and is currently on tamoxifen.   Tamoxifen Toxicities:None   Hospitalization: 01/04/2021-01/10/2021: Mental health crisis  Mammogram 05/08/2021: Benign  Breast MRI 06/17/2022: Improving enhancement of the UIQ and UOQ right breast.  Residual small non-mass enhancement 1.7 x 1 cm (previously 5.6 x 2.9 cm), additional 5 to 6 mm enhancing masses in the right breast stable  Will consider doing annual breast MRIs.   Return to clinic in 6 months for follow-up

## 2022-06-25 ENCOUNTER — Telehealth: Payer: Self-pay | Admitting: Hematology and Oncology

## 2022-06-25 NOTE — Telephone Encounter (Signed)
Scheduled appointment per 3/19 los. Left voicemail.

## 2022-06-26 ENCOUNTER — Encounter: Payer: Self-pay | Admitting: Hematology and Oncology

## 2022-06-26 ENCOUNTER — Other Ambulatory Visit: Payer: Self-pay | Admitting: *Deleted

## 2022-06-26 DIAGNOSIS — D0511 Intraductal carcinoma in situ of right breast: Secondary | ICD-10-CM

## 2022-08-04 ENCOUNTER — Telehealth: Payer: Self-pay

## 2022-08-04 NOTE — Telephone Encounter (Signed)
Called patient to schedule a AWV, unable to make contact or leave voicemail

## 2022-09-22 DIAGNOSIS — F310 Bipolar disorder, current episode hypomanic: Secondary | ICD-10-CM | POA: Diagnosis not present

## 2022-09-30 ENCOUNTER — Ambulatory Visit (HOSPITAL_COMMUNITY): Payer: Medicare Other | Admitting: Mental Health

## 2022-10-05 ENCOUNTER — Encounter: Payer: Self-pay | Admitting: Hematology and Oncology

## 2022-10-06 ENCOUNTER — Telehealth: Payer: Self-pay

## 2022-10-06 NOTE — Telephone Encounter (Signed)
error 

## 2022-10-06 NOTE — Progress Notes (Signed)
HEMATOLOGY-ONCOLOGY TELEPHONE VISIT PROGRESS NOTE  I connected with our patient on 10/13/22 at  9:45 AM EDT by telephone and verified that I am speaking with the correct person using two identifiers.  I discussed the limitations, risks, security and privacy concerns of performing an evaluation and management service by telephone and the availability of in person appointments.  I also discussed with the patient that there may be a patient responsible charge related to this service. The patient expressed understanding and agreed to proceed.   History of Present Illness: Catherine Olsen is a 52 year old with  DCIS status postlumpectomy and had been on tamoxifen therapy. She presents to the clinic via phone to discuss new breast concern.  Oncology History  Ductal carcinoma in situ (DCIS) of right breast  06/29/2019 Initial Diagnosis   Right breast biopsy: Fibrocystic changes, pseudoangiomatous stromal hyperplasia   07/21/2019 Surgery   Right lumpectomy: Dr. Ezzard Standing: Intermediate grade DCIS 0.2 cm, 0.1 cm from anterior margin, LCIS, ER 95%, PR 30% Tis NX stage 0   07/21/2019 Cancer Staging   Staging form: Breast, AJCC 8th Edition - Pathologic stage from 07/21/2019: Stage 0 (pTis (DCIS), pN0, cM0, ER+, PR+) - Signed by Loa Socks, NP on 08/03/2019   07/05/2020 -  Anti-estrogen oral therapy   10 mg of tamoxifen daily      REVIEW OF SYSTEMS:   Constitutional: Denies fevers, chills or abnormal weight loss All other systems were reviewed with the patient and are negative. Observations/Objective:     Assessment Plan:  Ductal carcinoma in situ (DCIS) of right breast Right lumpectomy: Dr. Ezzard Standing: Intermediate grade DCIS 0.2 cm, 0.1 cm from anterior margin, LCIS, ER 95%, PR 30% Tis NX stage 0  07/06/20: Right Lumpectomy: 0.2 cm IG DCIS Margins Neg, ER /PR Positive Treatment plan: Adjuvant radiation therapy: Patient discussed with Dr. Roselind Messier and decided against doing radiation.   ___________________________________________________________________________ Maxillofacial CT scan 11/22/2019: Normal  Dr. Lance Coon is her psychiatrist   Breast Cancer Surveillance: Mammogram 10/26/2019: Benign breast density category D Breast MRI 06/29/2020: Interval 5.6 x 3.6 x 2.9 cm area of low-grade ductal enhancement in the upper half of right breast.   Patient decided not to undergo surgery for this and is currently on tamoxifen.   Tamoxifen Toxicities:None   Hospitalization: 01/04/2021-01/10/2021: Mental health crisis  Mammogram 05/08/2021: Benign  Breast MRI 06/17/2022: Improving enhancement of the UIQ and UOQ right breast.  Residual small non-mass enhancement 1.7 x 1 cm (previously 5.6 x 2.9 cm), additional 5 to 6 mm enhancing masses in the right breast stable   Annual breast MRIs.  Palpable lump in the right breast: We will request an ultrasound evaluation and biopsy if needed.     I discussed the assessment and treatment plan with the patient. The patient was provided an opportunity to ask questions and all were answered. The patient agreed with the plan and demonstrated an understanding of the instructions. The patient was advised to call back or seek an in-person evaluation if the symptoms worsen or if the condition fails to improve as anticipated.   I provided 12 minutes of non-face-to-face time during this encounter.  This includes time for charting and coordination of care   Tamsen Meek, MD  I Janan Ridge am acting as a scribe for Dr.Aalijah Lanphere  I have reviewed the above documentation for accuracy and completeness, and I agree with the above.

## 2022-10-07 DIAGNOSIS — F319 Bipolar disorder, unspecified: Secondary | ICD-10-CM | POA: Diagnosis not present

## 2022-10-10 DIAGNOSIS — F319 Bipolar disorder, unspecified: Secondary | ICD-10-CM | POA: Diagnosis not present

## 2022-10-13 ENCOUNTER — Encounter: Payer: Self-pay | Admitting: Hematology and Oncology

## 2022-10-13 ENCOUNTER — Other Ambulatory Visit: Payer: Self-pay | Admitting: Hematology and Oncology

## 2022-10-13 ENCOUNTER — Inpatient Hospital Stay: Payer: Medicare Other | Attending: Hematology and Oncology | Admitting: Hematology and Oncology

## 2022-10-13 DIAGNOSIS — F319 Bipolar disorder, unspecified: Secondary | ICD-10-CM | POA: Diagnosis not present

## 2022-10-13 DIAGNOSIS — N6310 Unspecified lump in the right breast, unspecified quadrant: Secondary | ICD-10-CM

## 2022-10-13 DIAGNOSIS — D0511 Intraductal carcinoma in situ of right breast: Secondary | ICD-10-CM

## 2022-10-13 NOTE — Assessment & Plan Note (Signed)
Right lumpectomy: Dr. Ezzard Standing: Intermediate grade DCIS 0.2 cm, 0.1 cm from anterior margin, LCIS, ER 95%, PR 30% Tis NX stage 0  07/06/20: Right Lumpectomy: 0.2 cm IG DCIS Margins Neg, ER /PR Positive Treatment plan: Adjuvant radiation therapy: Patient discussed with Dr. Roselind Messier and decided against doing radiation.  ___________________________________________________________________________ Maxillofacial CT scan 11/22/2019: Normal  Dr. Lance Coon is her psychiatrist   Breast Cancer Surveillance: Mammogram 10/26/2019: Benign breast density category D Breast MRI 06/29/2020: Interval 5.6 x 3.6 x 2.9 cm area of low-grade ductal enhancement in the upper half of right breast.   Patient decided not to undergo surgery for this and is currently on tamoxifen.   Tamoxifen Toxicities:None   Hospitalization: 01/04/2021-01/10/2021: Mental health crisis  Mammogram 05/08/2021: Benign  Breast MRI 06/17/2022: Improving enhancement of the UIQ and UOQ right breast.  Residual small non-mass enhancement 1.7 x 1 cm (previously 5.6 x 2.9 cm), additional 5 to 6 mm enhancing masses in the right breast stable   Will consider doing annual breast MRIs. Breast ultrasound has been requested for August 2024. February 2025 she will undergo contrast-enhanced mammograms. Return to clinic in 1 year for follow-up

## 2022-10-14 ENCOUNTER — Ambulatory Visit
Admission: RE | Admit: 2022-10-14 | Discharge: 2022-10-14 | Disposition: A | Discharge status: Home / Self Care | Payer: Medicare Other | Source: Ambulatory Visit | Attending: Hematology and Oncology | Admitting: Hematology and Oncology

## 2022-10-14 DIAGNOSIS — N6315 Unspecified lump in the right breast, overlapping quadrants: Secondary | ICD-10-CM | POA: Diagnosis not present

## 2022-10-14 DIAGNOSIS — N6310 Unspecified lump in the right breast, unspecified quadrant: Secondary | ICD-10-CM

## 2022-10-16 ENCOUNTER — Other Ambulatory Visit: Payer: Medicare Other

## 2022-10-16 DIAGNOSIS — F319 Bipolar disorder, unspecified: Secondary | ICD-10-CM | POA: Diagnosis not present

## 2022-10-18 DIAGNOSIS — R220 Localized swelling, mass and lump, head: Secondary | ICD-10-CM | POA: Diagnosis not present

## 2022-10-21 DIAGNOSIS — F319 Bipolar disorder, unspecified: Secondary | ICD-10-CM | POA: Diagnosis not present

## 2022-10-24 DIAGNOSIS — F319 Bipolar disorder, unspecified: Secondary | ICD-10-CM | POA: Diagnosis not present

## 2022-10-27 DIAGNOSIS — Z20822 Contact with and (suspected) exposure to covid-19: Secondary | ICD-10-CM | POA: Diagnosis not present

## 2022-10-27 DIAGNOSIS — R197 Diarrhea, unspecified: Secondary | ICD-10-CM | POA: Diagnosis not present

## 2022-10-27 DIAGNOSIS — J0100 Acute maxillary sinusitis, unspecified: Secondary | ICD-10-CM | POA: Diagnosis not present

## 2022-10-27 DIAGNOSIS — J0101 Acute recurrent maxillary sinusitis: Secondary | ICD-10-CM | POA: Diagnosis not present

## 2022-10-27 DIAGNOSIS — Z780 Asymptomatic menopausal state: Secondary | ICD-10-CM | POA: Diagnosis not present

## 2022-10-27 DIAGNOSIS — F319 Bipolar disorder, unspecified: Secondary | ICD-10-CM | POA: Diagnosis not present

## 2022-10-27 DIAGNOSIS — Z753 Unavailability and inaccessibility of health-care facilities: Secondary | ICD-10-CM | POA: Diagnosis not present

## 2022-10-28 DIAGNOSIS — L03211 Cellulitis of face: Secondary | ICD-10-CM | POA: Diagnosis not present

## 2022-10-28 DIAGNOSIS — R519 Headache, unspecified: Secondary | ICD-10-CM | POA: Diagnosis not present

## 2022-10-30 DIAGNOSIS — F319 Bipolar disorder, unspecified: Secondary | ICD-10-CM | POA: Diagnosis not present

## 2022-10-31 DIAGNOSIS — F319 Bipolar disorder, unspecified: Secondary | ICD-10-CM | POA: Diagnosis not present

## 2022-11-06 DIAGNOSIS — M799 Soft tissue disorder, unspecified: Secondary | ICD-10-CM | POA: Diagnosis not present

## 2022-11-06 DIAGNOSIS — L03211 Cellulitis of face: Secondary | ICD-10-CM | POA: Diagnosis not present

## 2022-11-06 DIAGNOSIS — R519 Headache, unspecified: Secondary | ICD-10-CM | POA: Diagnosis not present

## 2022-11-10 ENCOUNTER — Other Ambulatory Visit: Payer: Medicare Other

## 2022-11-11 ENCOUNTER — Other Ambulatory Visit: Payer: Medicare Other

## 2022-12-09 ENCOUNTER — Encounter: Payer: Self-pay | Admitting: Hematology and Oncology

## 2022-12-09 ENCOUNTER — Other Ambulatory Visit: Payer: Self-pay | Admitting: Hematology and Oncology

## 2022-12-09 ENCOUNTER — Other Ambulatory Visit: Payer: Medicare Other

## 2022-12-09 MED ORDER — TAMOXIFEN CITRATE 20 MG PO TABS: 20.0000 mg | ORAL_TABLET | Freq: Every evening | ORAL | 3 refills | Status: DC

## 2022-12-25 ENCOUNTER — Other Ambulatory Visit: Payer: Self-pay | Admitting: Hematology and Oncology

## 2023-01-14 ENCOUNTER — Ambulatory Visit
Admission: RE | Admit: 2023-01-14 | Discharge: 2023-01-14 | Disposition: A | Discharge status: Home / Self Care | Payer: Medicare Other | Source: Ambulatory Visit | Attending: Internal Medicine | Admitting: Internal Medicine

## 2023-01-14 VITALS — BP 113/75 | HR 86 | Temp 98.9°F | Resp 18

## 2023-01-14 DIAGNOSIS — K047 Periapical abscess without sinus: Secondary | ICD-10-CM | POA: Diagnosis not present

## 2023-01-14 DIAGNOSIS — K0889 Other specified disorders of teeth and supporting structures: Secondary | ICD-10-CM

## 2023-01-14 MED ORDER — CLINDAMYCIN HCL 300 MG PO CAPS
300.0000 mg | ORAL_CAPSULE | Freq: Three times a day (TID) | ORAL | 0 refills | Status: AC
Start: 2023-01-14 — End: 2023-01-24

## 2023-01-14 MED ORDER — IBUPROFEN 800 MG PO TABS: ORAL_TABLET | ORAL | 0 refills | Status: DC

## 2023-01-14 NOTE — ED Triage Notes (Signed)
Pt with right sided facial swelling, right sided tooth pain/gum swelling/gum bleeding x 2 days.

## 2023-01-14 NOTE — Discharge Instructions (Addendum)
You were prescribed an antibiotic called Clindamycin. This is often used to treat dental infections; however, this antibiotic will not take care of the dental problem alone. It is very important that you see a dentist as soon as possible. The infection will continue to reoccur if the problem is not fixed by a dentist. If not allergic, you may use over the counter ibuprofen or acetaminophen as needed for the pain.  

## 2023-01-14 NOTE — ED Provider Notes (Signed)
[facility]  Note:  This document was prepared using Dragon voice recognition software and may include unintentional dictation errors.  MRN: [id number] DOB: February 21, 1971 DATE: 01/14/23   Subjective:  Chief Complaint:  Chief Complaint  Patient presents with   Dental Pain     HPI: Catherine Olsen is a 52 y.o. female presenting for possible dental infection for the past 2 days. Patient reported noticing some discomfort in her right upper jaw yesterday. She attributed the pain to grinding her teeth from stress when the pain subsided yesterday. However, today she started noticing pain more with palpation of the right upper jaw and sinus. She looked in the mirror and noticed her gum was bleeding and swollen. She reports that she has also started having some swelling along the right upper jaw. She has been doing salt water gargles with some relief. She has dentist appointment on 01/19/2023. 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 Denies fever, nausea/vomiting, nasal congestion. Endorses dental pain, jaw pain. Presents NAD.  Prior to Admission medications   Medication Sig Start Date End Date Taking? Authorizing Provider  albuterol (VENTOLIN HFA) 108 (90 Base) MCG/ACT inhaler Inhale 2 puffs into the lungs every 4 (four) hours as needed for wheezing or shortness of breath. 05/16/21   Storm Frisk, MD  clonazePAM (KLONOPIN) 0.5 MG tablet Take 0.5 mg by mouth at bedtime. 08/31/21   [provider]  divalproex (DEPAKOTE ER) 500 MG 24 hr tablet Take 500 mg by mouth at bedtime. 08/10/21   [provider]  EPINEPHrine 0.3 mg/0.3 mL IJ SOAJ injection Inject 0.3 mg into the skin as needed for anaphylaxis. 05/16/21   Storm Frisk, MD  ibuprofen (ADVIL) 800 MG tablet TAKE 1 TABLET(800 MG) BY MOUTH EVERY 8 HOURS AS NEEDED 02/10/22   Hoy Register, MD  tamoxifen (NOLVADEX) 20 MG tablet TAKE 1 TABLET IN THE EVENING  12/29/22   Serena Croissant, MD  traZODone (DESYREL) 100 MG tablet Take 200 mg by mouth at bedtime. 04/08/21   [provider]  tretinoin (RETIN-A) 0.025 % cream Apply 1 Application topically at bedtime. Apply to affected areas at bedtime 10/06/21   Storm Frisk, MD  VRAYLAR 1.5 MG capsule Take 1.5 mg by mouth daily. 04/21/21   [provider]     Allergies  Allergen Reactions   Other Anaphylaxis    Idiopathic (possible binder or preservatives in medication)     Prednisone Anaphylaxis    Steroids Steroids ( can take with benadryl )   Can not take higher than 40 mg    Sulfamethoxazole Anaphylaxis   Oxycodone Other (See Comments)    Severe blindness?   Abilify [Aripiprazole]     Generic Abilify--causes severe neurological problems. Can use name brand   Celebrex [Celecoxib]    Misc. Sulfonamide Containing Compounds    Molds & Smuts Other (See Comments)    Environmental   Penicillins Hives    Has patient had a PCN reaction causing immediate rash, facial/tongue/throat swelling, SOB or lightheadedness with hypotension: no Has patient had a PCN reaction causing severe rash involving mucus membranes or skin necrosis: NO Has patient had a PCN reaction that required hospitalizationNO Has patient had a PCN reaction occurring within the last 10 years:NO If all of the above answers are "NO", then may proceed with Cephalosporin use.    Tylenol [Acetaminophen]     Stomach pain   Lamictal [Lamotrigine] Rash  Percocet [Oxycodone-Acetaminophen]     Vision loss   Sulfa Antibiotics Hives    Pt states she is not really allergic to sulfa, but to sulfites.   Sulfites Other (See Comments)    unknown    History:   Past Medical History:  Diagnosis Date   Abnormal uterine bleeding (AUB) 06/25/2009   Qualifier: Diagnosis of  By: Eben Burow MD, Ankit     Allergy    Angio-edema    Anxiety    Arthritis    hands, lower back, knees   Asthma    Bipolar 1 disorder (HCC)    Breast cancer  (HCC)    stage 0   Chronic prescription benzodiazepine use 09/08/2018   COPD (chronic obstructive pulmonary disease) (HCC)    Depression    Endometriosis    Fibromyalgia    GERD (gastroesophageal reflux disease)    Headache(784.0)    otc med prn   Migraine    Pituitary tumor    microadenoma   PONV (postoperative nausea and vomiting)    PTSD (post-traumatic stress disorder)    Scoliosis    Suicidal ideation 01/05/2021   Termination of pregnancy    x 2 at age 71 and 52 yrs old   Tobacco abuse 03/04/2015   Urticaria      Past Surgical History:  Procedure Laterality Date   BREAST BIOPSY Right 05/19/2007   BREAST BIOPSY Right 06/02/2007   BREAST BIOPSY  06/29/2019   BREAST EXCISIONAL BIOPSY     BREAST LUMPECTOMY Right 07/21/2019   BREAST LUMPECTOMY WITH RADIOACTIVE SEED LOCALIZATION Right 07/21/2019   Procedure: RIGHT BREAST LUMPECTOMY WITH RADIOACTIVE SEED LOCALIZATION;  Surgeon: Ovidio Kin, MD;  Location: [facility];  Service: General;  Laterality: Right;   BREAST SURGERY     DILATION AND CURETTAGE OF UTERUS     FACIAL COSMETIC SURGERY     right cheek   LAPAROSCOPY Right 11/11/2013   Procedure: LAPAROSCOPY OPERATIVE with  Drainage of  RIGHT Ovarian ENDOMETRIOMA;  Surgeon: Robley Fries, MD;  Location: [facility];  Service: Gynecology;  Laterality: Right;   NASAL ENDOSCOPY     said it showed some acid reflux from ENT   NOSE SURGERY     rhinoplasty at age 25 yrs   WISDOM TOOTH EXTRACTION      Family History  Problem Relation Age of Onset   Bipolar disorder Mother    Diabetes Mother    Hypertension Mother    Stroke Mother 34       CVA   Mental illness Mother        no diagnosis; personality disorder   Bipolar disorder Father    Hyperlipidemia Father    Hypertension Father    COPD Father    Alpha-1 antitrypsin deficiency Father    Asthma Father    Alpha-1 antitrypsin deficiency Brother    Asthma Brother    Heart disease Maternal Grandfather     Hyperlipidemia Maternal Grandfather    Hypertension Maternal Grandfather    Asthma Maternal Grandfather    Bipolar disorder Maternal Grandmother    Depression Maternal Grandmother    Diabetes Maternal Grandmother    Heart disease Maternal Grandmother    Hyperlipidemia Maternal Grandmother    Hypertension Maternal Grandmother    Mental illness Maternal Grandmother    Heart disease Paternal Grandfather    Hyperlipidemia Paternal Grandfather    Hypertension Paternal Grandfather    Stroke Paternal Grandfather    Alzheimer's disease Paternal Grandmother    Allergic  rhinitis Neg Hx    Angioedema Neg Hx    Eczema Neg Hx    Urticaria Neg Hx    Immunodeficiency Neg Hx    Colon cancer Neg Hx    Esophageal cancer Neg Hx     Social History   Tobacco Use   Smoking status: Every Day    Current packs/day: 0.00    Average packs/day: 0.5 packs/day for 29.0 years (14.5 ttl pk-yrs)    Types: Cigarettes    Start date: 10/08/1990    Last attempt to quit: 10/08/2019    Years since quitting: 3.2   Smokeless tobacco: Never  Vaping Use   Vaping status: Former  Substance Use Topics   Alcohol use: Not Currently    Alcohol/week: 2.0 standard drinks of alcohol    Types: 2 Cans of beer per week    Comment: 6 nights   Drug use: Not Currently    Types: "Crack" cocaine    Comment: 07/19/2019    Review of Systems  Constitutional:  Negative for fever.  HENT:  Positive for dental problem and facial swelling. Negative for congestion and rhinorrhea.   Gastrointestinal:  Negative for nausea and vomiting.     Objective:   Vitals: BP 113/75 (BP Location: Right Arm)   Pulse 86   Temp 98.9 F (37.2 C) (Oral)   Resp 18   LMP 11/21/2014   SpO2 95%   Physical Exam Constitutional:      General: She is not in acute distress.    Appearance: Normal appearance. She is well-developed and normal weight. She is not ill-appearing or toxic-appearing.  HENT:     Head: Normocephalic and atraumatic.      Mouth/Throat:     Dentition: Abnormal dentition. Dental tenderness and gingival swelling present.     Pharynx: Oropharynx is clear. Uvula midline. No pharyngeal swelling, oropharyngeal exudate or posterior oropharyngeal erythema.     Tonsils: No tonsillar exudate or tonsillar abscesses.      Comments: Poor dentition noted. Patient reports bleeding noted earlier at right upper bicuspid. Crown in place. Gingivitis noted around the right upper teeth.  Cardiovascular:     Rate and Rhythm: Normal rate and regular rhythm.     Heart sounds: Normal heart sounds.  Pulmonary:     Effort: Pulmonary effort is normal.     Breath sounds: Normal breath sounds.     Comments: Clear to auscultation bilaterally  Abdominal:     General: Bowel sounds are normal.     Palpations: Abdomen is soft.     Tenderness: There is no abdominal tenderness.  Skin:    General: Skin is warm and dry.  Neurological:     General: No focal deficit present.     Mental Status: She is alert.  Psychiatric:        Mood and Affect: Mood and affect normal.     Results:  Labs: No results found for this or any previous visit (from the past 24 hour(s)).  Radiology: No results found.   UC Course/Treatments:  Procedures: Procedures   Medications Ordered in UC: Medications - No data to display   Assessment and Plan :     ICD-10-CM   1. Dental infection  K04.7     2. Pain, dental  K08.89      Dental infection Afebrile, nontoxic-appearing, NAD. VSS. DDX includes but not limited to: dental abscess, carries, dental infection Given past history of temporal bone infection from dental infection, Clindamycin 300mg  every 8 hours  was prescribed. Patient instructed to follow up with dentist. Strict ED precautions were given and patient verbalized understanding.  Pain, dental Afebrile, nontoxic-appearing, NAD. VSS. DDX includes but not limited to: dental abscess, carries, dental infection Given past history of temporal bone  infection from dental infection, Clindamycin 300mg  every 8 hours was prescribed. Ibuprofen 800mg  every 8 hours PRN was prescribed for pain. Patient reports taking it in the past without any issues for chronic back pain. Patient instructed to follow up with dentist. Strict ED precautions were given and patient verbalized understanding.   ED Discharge Orders          Ordered    ibuprofen (ADVIL) 800 MG tablet        01/14/23 1747    clindamycin (CLEOCIN) 300 MG capsule  Every 8 hours        01/14/23 1748             I have reviewed the PDMP during this encounter.     Jasira Robinson P, PA-C 01/14/23 1758

## 2023-01-23 DIAGNOSIS — F3130 Bipolar disorder, current episode depressed, mild or moderate severity, unspecified: Secondary | ICD-10-CM | POA: Diagnosis not present

## 2023-02-10 DIAGNOSIS — B9689 Other specified bacterial agents as the cause of diseases classified elsewhere: Secondary | ICD-10-CM | POA: Diagnosis not present

## 2023-02-10 DIAGNOSIS — R059 Cough, unspecified: Secondary | ICD-10-CM | POA: Diagnosis not present

## 2023-02-10 DIAGNOSIS — Z87892 Personal history of anaphylaxis: Secondary | ICD-10-CM | POA: Diagnosis not present

## 2023-02-10 DIAGNOSIS — J208 Acute bronchitis due to other specified organisms: Secondary | ICD-10-CM | POA: Diagnosis not present

## 2023-03-19 ENCOUNTER — Encounter: Payer: Self-pay | Admitting: Hematology and Oncology

## 2023-03-20 ENCOUNTER — Emergency Department (HOSPITAL_COMMUNITY)
Admission: EM | Admit: 2023-03-20 | Discharge: 2023-03-20 | Disposition: A | Discharge status: Home / Self Care | Payer: Medicare Other | Attending: Emergency Medicine | Admitting: Emergency Medicine

## 2023-03-20 ENCOUNTER — Encounter (HOSPITAL_COMMUNITY): Payer: Self-pay

## 2023-03-20 ENCOUNTER — Other Ambulatory Visit: Payer: Self-pay

## 2023-03-20 DIAGNOSIS — J0100 Acute maxillary sinusitis, unspecified: Secondary | ICD-10-CM | POA: Diagnosis not present

## 2023-03-20 DIAGNOSIS — Z20822 Contact with and (suspected) exposure to covid-19: Secondary | ICD-10-CM | POA: Insufficient documentation

## 2023-03-20 DIAGNOSIS — J0101 Acute recurrent maxillary sinusitis: Secondary | ICD-10-CM | POA: Diagnosis not present

## 2023-03-20 DIAGNOSIS — J449 Chronic obstructive pulmonary disease, unspecified: Secondary | ICD-10-CM | POA: Insufficient documentation

## 2023-03-20 DIAGNOSIS — R059 Cough, unspecified: Secondary | ICD-10-CM | POA: Diagnosis present

## 2023-03-20 LAB — SARS CORONAVIRUS 2 BY RT PCR: SARS Coronavirus 2 by RT PCR: NEGATIVE

## 2023-03-20 MED ORDER — IBUPROFEN 800 MG PO TABS: ORAL_TABLET | ORAL | 0 refills | Status: AC

## 2023-03-20 MED ORDER — AZITHROMYCIN 250 MG PO TABS: 250.0000 mg | ORAL_TABLET | Freq: Every day | ORAL | 0 refills | Status: DC

## 2023-03-20 NOTE — Discharge Instructions (Signed)
You were seen for your sinus infection in the emergency department.   At home, please perform sinus rinses and take the doxycyline for your infection.    Check your MyChart online for the results of any tests that had not resulted by the time you left the emergency department.   Follow-up with your primary doctor in 2-3 days regarding your visit.    Return immediately to the emergency department if you experience any of the following: difficulty breathing, or any other concerning symptoms.    Thank you for visiting our Emergency Department. It was a pleasure taking care of you today.

## 2023-03-20 NOTE — ED Provider Notes (Incomplete)
EMERGENCY DEPARTMENT AT [facility] Provider Note   CSN: [id number] Arrival date & time: 03/20/23  1547     History {Add pertinent medical, surgical, social history, OB history to HPI:1} Chief Complaint  Patient presents with   Sinus Problems    Catherine Olsen is a 52 y.o. female.   5 days, secondayr worsening today Pt came in via POV d/t sinus problems. States when she blows her nose it  is green & yellow, plus has facial pain. Since she has COPD she is  concerned that she isn't feeling better since it started a few days ago.  A/Ox4, 7/10 pain during triage. Also endorses coughing.     Past Medical History: 06/25/2009: Abnormal uterine bleeding (AUB)     Comment:  Qualifier: Diagnosis of  By: Eben Burow MD, Ankit   No date: Allergy No date: Angio-edema No date: Anxiety No date: Arthritis     Comment:  hands, lower back, knees No date: Asthma No date: Bipolar 1 disorder (HCC) No date: Breast cancer (HCC)     Comment:  stage 0 09/08/2018: Chronic prescription benzodiazepine use No date: COPD (chronic obstructive pulmonary disease) (HCC) No date: Depression No date: Endometriosis No date: Fibromyalgia No date: GERD (gastroesophageal reflux disease) No date: Headache(784.0)     Comment:  otc med prn No date: Migraine No date: Pituitary tumor     Comment:  microadenoma No date: PONV (postoperative nausea and vomiting) No date: PTSD (post-traumatic stress disorder) No date: Scoliosis 01/05/2021: Suicidal ideation No date: Termination of pregnancy     Comment:  x 2 at age 73 and 52 yrs old 03/04/2015: Tobacco abuse No date: Urticaria        Home Medications Prior to Admission medications   Medication Sig Start Date End Date Taking? Authorizing Provider  albuterol (VENTOLIN HFA) 108 (90 Base) MCG/ACT inhaler Inhale 2 puffs into the lungs every 4 (four) hours as needed for wheezing or shortness of breath. 05/16/21   Storm Frisk, MD  clonazePAM  (KLONOPIN) 0.5 MG tablet Take 0.5 mg by mouth at bedtime. 08/31/21   [provider]  divalproex (DEPAKOTE ER) 500 MG 24 hr tablet Take 500 mg by mouth at bedtime. 08/10/21   [provider]  EPINEPHrine 0.3 mg/0.3 mL IJ SOAJ injection Inject 0.3 mg into the skin as needed for anaphylaxis. 05/16/21   Storm Frisk, MD  ibuprofen (ADVIL) 800 MG tablet TAKE 1 TABLET(800 MG) BY MOUTH EVERY 8 HOURS AS NEEDED 01/14/23   Hermanns, Ashlee P, PA-C  tamoxifen (NOLVADEX) 20 MG tablet TAKE 1 TABLET IN THE EVENING 12/29/22   Serena Croissant, MD  traZODone (DESYREL) 100 MG tablet Take 200 mg by mouth at bedtime. 04/08/21   [provider]  tretinoin (RETIN-A) 0.025 % cream Apply 1 Application topically at bedtime. Apply to affected areas at bedtime 10/06/21   Storm Frisk, MD  VRAYLAR 1.5 MG capsule Take 1.5 mg by mouth daily. 04/21/21   [provider]      Allergies    Other, Prednisone, Sulfamethoxazole, Oxycodone, Abilify [aripiprazole], Celebrex [celecoxib], Misc. sulfonamide containing compounds, Molds & smuts, Penicillins, Tylenol [acetaminophen], Lamictal [lamotrigine], Percocet [oxycodone-acetaminophen], Sulfa antibiotics, and Sulfites    Review of Systems   Review of Systems  Physical Exam Updated Vital Signs BP 128/87   Pulse 99   Temp (!) 97.4 F (36.3 C) (Oral)   Resp 20   Ht 5\' 7"  (1.702 m)   Wt 64.9 kg  LMP 11/21/2014   SpO2 96%   BMI 22.40 kg/m  Physical Exam  ED Results / Procedures / Treatments   Labs (all labs ordered are listed, but only abnormal results are displayed) Labs Reviewed - No data to display  EKG None  Radiology No results found.  Procedures Procedures  {Document cardiac monitor, telemetry assessment procedure when appropriate:1}  Medications Ordered in ED Medications - No data to display  ED Course/ Medical Decision Making/ A&P   {   Click here for ABCD2, HEART and other calculatorsREFRESH Note before signing :1}                               Medical Decision Making  ***  {Document critical care time when appropriate:1} {Document review of labs and clinical decision tools ie heart score, Chads2Vasc2 etc:1}  {Document your independent review of radiology images, and any outside records:1} {Document your discussion with family members, caretakers, and with consultants:1} {Document social determinants of health affecting pt's care:1} {Document your decision making why or why not admission, treatments were needed:1} Final Clinical Impression(s) / ED Diagnoses Final diagnoses:  None    Rx / DC Orders ED Discharge Orders     None

## 2023-03-20 NOTE — ED Triage Notes (Signed)
Pt came in via POV d/t sinus problems. States when she blows her nose it is green & yellow, plus has facial pain. Since she has COPD she is concerned that she isn't feeling better since it started a few days ago. A/Ox4, 7/10 pain during triage. Also endorses coughing.

## 2023-03-24 DIAGNOSIS — J208 Acute bronchitis due to other specified organisms: Secondary | ICD-10-CM | POA: Diagnosis not present

## 2023-03-26 ENCOUNTER — Inpatient Hospital Stay: Payer: Medicare Other | Attending: Adult Health | Admitting: Adult Health

## 2023-03-26 VITALS — BP 117/70 | HR 94 | Temp 97.6°F | Resp 16 | Ht 67.0 in | Wt 145.3 lb

## 2023-03-26 DIAGNOSIS — Z79810 Long term (current) use of selective estrogen receptor modulators (SERMs): Secondary | ICD-10-CM | POA: Diagnosis not present

## 2023-03-26 DIAGNOSIS — Z170 Estrogen receptor positive status [ER+]: Secondary | ICD-10-CM | POA: Insufficient documentation

## 2023-03-26 DIAGNOSIS — N6341 Unspecified lump in right breast, subareolar: Secondary | ICD-10-CM

## 2023-03-26 DIAGNOSIS — D0511 Intraductal carcinoma in situ of right breast: Secondary | ICD-10-CM | POA: Insufficient documentation

## 2023-03-26 NOTE — Progress Notes (Signed)
[facility] Cancer Follow up:    Catherine Frisk, MD [address] [place] [address]   DIAGNOSIS:  Cancer Staging  Ductal carcinoma in situ (DCIS) of right breast Staging form: Breast, AJCC 8th Edition - Pathologic stage from 07/21/2019: Stage 0 (pTis (DCIS), pN0, cM0, ER+, PR+) - Signed by Loa Socks, NP on 08/03/2019 Stage prefix: Initial diagnosis   SUMMARY OF ONCOLOGIC HISTORY: Oncology History  Ductal carcinoma in situ (DCIS) of right breast  06/29/2019 Initial Diagnosis   Right breast biopsy: Fibrocystic changes, pseudoangiomatous stromal hyperplasia   07/21/2019 Surgery   Right lumpectomy: Dr. Ezzard Standing: Intermediate grade DCIS 0.2 cm, 0.1 cm from anterior margin, LCIS, ER 95%, PR 30% Tis NX stage 0   07/21/2019 Cancer Staging   Staging form: Breast, AJCC 8th Edition - Pathologic stage from 07/21/2019: Stage 0 (pTis (DCIS), pN0, cM0, ER+, PR+) - Signed by Loa Socks, NP on 08/03/2019   07/05/2020 -  Anti-estrogen oral therapy   10 mg of tamoxifen daily      CURRENT THERAPY:tamoxifen  INTERVAL HISTORY:  Discussed the use of AI scribe software for clinical note transcription with the patient, who gave verbal consent to proceed.  Catherine Olsen 52 y.o. female returns for evaluation of growing right breast mass.  presents with concerns about a new palpable change in the left breast. She describes a hard area in the lumpectomy site that feels different from before, which she noticed while self-examining. The patient reports that this area was not inflamed initially but has since resolved. She expresses uncertainty about whether this change was present before due to frequent changes in her breast tissue.  The patient has been following up with regular diagnostic mammograms and ultrasounds, with the next one due in two months. She mentions a previous finding of a 2mm cancerous lesion right at the skin, which was removed. The  patient also recalls a history of architectural distortion and radial scarring in the same area, which had been developing over several years.  The patient is not overly concerned about the new finding but acknowledges that it feels different and was not there before. She expresses a desire to have the area examined and followed up, despite a general reluctance to worry about every new change she feels in her breast.   Patient Active Problem List   Diagnosis Date Noted   Therapeutic drug monitoring 05/16/2021   Atherosclerosis 05/16/2021   Anorexia 05/16/2021   Weight loss 05/16/2021   Bipolar affective disorder, mixed, severe, with psychotic behavior (HCC) 01/05/2021   Cervical spondylosis 10/16/2020   Bipolar 1 disorder, depressed, severe (HCC) 08/12/2020   Breast cancer (HCC)    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   Cocaine abuse (HCC) 09/08/2018   History of suicidal ideation 09/08/2018   Other allergic rhinitis 02/02/2018   Allergy, unspecified, subsequent encounter 02/02/2018   Mild intermittent asthma without complication 02/02/2018   Angio-edema 02/02/2018   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   Gastroesophageal reflux disease without esophagitis 02/02/2018   Chronic dental pain 01/20/2018   Chronic facial pain 01/20/2018   Environmental allergies 10/29/2017   Pain in joint of left shoulder 07/15/2017   Neck pain 07/15/2017   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   Chronic migraine without aura without status migrainosus, not intractable 03/24/2017   Chronic midline low back pain without sciatica 03/24/2017   Pulmonary emphysema (HCC) 02/19/2017   Jaw pain 02/11/2017   Tobacco use disorder 12/09/2016  Exertional dyspnea 12/09/2016   Family history of alpha 1 antitrypsin deficiency 09/04/2016   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   Nodule of left lung 09/04/2016   Cervical lymphadenopathy 01/29/2016   Perimenopausal vasomotor symptoms  05/25/2015   Chronic rhinitis 04/18/2015   Pituitary microadenoma (HCC) 05/19/2014   Endometriosis of ovary 04/03/2014   Ovarian cyst, right 04/03/2014   Posttraumatic stress disorder 09/13/2012   Fibrocystic breast disease 08/24/2012   Myalgia and myositis 07/26/2012   Depression 07/26/2012   PTSD (post-traumatic stress disorder) 01/09/2009   Insomnia 01/17/2008   NEVI, MULTIPLE 07/17/2006   KERATOSIS, SEBORRHEIC NEC 07/17/2006   ACNE NEC 07/17/2006    is allergic to other, prednisone, sulfamethoxazole, oxycodone, abilify [aripiprazole], celebrex [celecoxib], misc. sulfonamide containing compounds, molds & smuts, penicillins, tylenol [acetaminophen], lamictal [lamotrigine], percocet [oxycodone-acetaminophen], sulfa antibiotics, and sulfites.  MEDICAL HISTORY: Past Medical History:  Diagnosis Date   Abnormal uterine bleeding (AUB) 06/25/2009   Qualifier: Diagnosis of  By: Eben Burow MD, Ankit     Allergy    Angio-edema    Anxiety    Arthritis    hands, lower back, knees   Asthma    Bipolar 1 disorder (HCC)    Breast cancer (HCC)    stage 0   Chronic prescription benzodiazepine use 09/08/2018   COPD (chronic obstructive pulmonary disease) (HCC)    Depression    Endometriosis    Fibromyalgia    GERD (gastroesophageal reflux disease)    Headache(784.0)    otc med prn   Migraine    Pituitary tumor    microadenoma   PONV (postoperative nausea and vomiting)    PTSD (post-traumatic stress disorder)    Scoliosis    Suicidal ideation 01/05/2021   Termination of pregnancy    x 2 at age 56 and 52 yrs old   Tobacco abuse 03/04/2015   Urticaria     SURGICAL HISTORY: Past Surgical History:  Procedure Laterality Date   BREAST BIOPSY Right 05/19/2007   BREAST BIOPSY Right 06/02/2007   BREAST BIOPSY  06/29/2019   BREAST EXCISIONAL BIOPSY     BREAST LUMPECTOMY Right 07/21/2019   BREAST LUMPECTOMY WITH RADIOACTIVE SEED LOCALIZATION Right 07/21/2019   Procedure: RIGHT BREAST LUMPECTOMY  WITH RADIOACTIVE SEED LOCALIZATION;  Surgeon: Ovidio Kin, MD;  Location: [facility];  Service: General;  Laterality: Right;   BREAST SURGERY     DILATION AND CURETTAGE OF UTERUS     FACIAL COSMETIC SURGERY     right cheek   LAPAROSCOPY Right 11/11/2013   Procedure: LAPAROSCOPY OPERATIVE with  Drainage of  RIGHT Ovarian ENDOMETRIOMA;  Surgeon: Robley Fries, MD;  Location: [facility];  Service: Gynecology;  Laterality: Right;   NASAL ENDOSCOPY     said it showed some acid reflux from ENT   NOSE SURGERY     rhinoplasty at age 33 yrs   WISDOM TOOTH EXTRACTION      SOCIAL HISTORY: Social History   Socioeconomic History   Marital status: Divorced    Spouse name: Not on file   Number of children: 0   Years of education: Not on file   Highest education level: Bachelor's degree (e.g., BA, AB, BS)  Occupational History   Occupation: disability    Comment: mental illness  Tobacco Use   Smoking status: Every Day    Current packs/day: 0.00    Average packs/day: 0.5 packs/day for 29.0 years (14.5 ttl pk-yrs)    Types: Cigarettes    Start date: 10/08/1990  Last attempt to quit: 10/08/2019    Years since quitting: 3.4   Smokeless tobacco: Never  Vaping Use   Vaping status: Former  Substance and Sexual Activity   Alcohol use: Not Currently    Alcohol/week: 2.0 standard drinks of alcohol    Types: 2 Cans of beer per week    Comment: 6 nights   Drug use: Not Currently    Types: "Crack" cocaine    Comment: 07/19/2019   Sexual activity: Not Currently    Comment: abortion at 63 and 9yrs. of age   Other Topics Concern   Not on file  Social History Narrative   Marital status: divorced; not dating      Children: none      Lives: alone with mom      Employment: disability for mental illness in 94.  Hospitalizations x 9 in past.      Tobacco: 1 ppd x since age 82. Decreasing in 2018.      Alcohol: socially; beers.      Drugs:  Not currently; previous in past; cocaine when  manic.      Exercise: yoga daily; exercise daily.      ADLs: independent with ADLs; no car; depends on others for transportation.      Patient is right-handed. She is divorced and lives with her mother. She drinks 2-3 cups of 1/2 caffeine coffe a day. She walks occasionally for exercise.   Social Drivers of Corporate investment banker Strain: Not on file  Food Insecurity: Not on file  Transportation Needs: Not on file  Physical Activity: Not on file  Stress: Not on file  Social Connections: Unknown (02/10/2023)   Received from [facility]   Social Network    Social Network: Not on file  Intimate Partner Violence: Unknown (02/10/2023)   Received from [facility]   HITS    Physically Hurt: Not on file    Insult or Talk Down To: Not on file    Threaten Physical Harm: Not on file    Scream or Curse: Not on file    FAMILY HISTORY: Family History  Problem Relation Age of Onset   Bipolar disorder Mother    Diabetes Mother    Hypertension Mother    Stroke Mother 52       CVA   Mental illness Mother        no diagnosis; personality disorder   Bipolar disorder Father    Hyperlipidemia Father    Hypertension Father    COPD Father    Alpha-1 antitrypsin deficiency Father    Asthma Father    Alpha-1 antitrypsin deficiency Brother    Asthma Brother    Heart disease Maternal Grandfather    Hyperlipidemia Maternal Grandfather    Hypertension Maternal Grandfather    Asthma Maternal Grandfather    Bipolar disorder Maternal Grandmother    Depression Maternal Grandmother    Diabetes Maternal Grandmother    Heart disease Maternal Grandmother    Hyperlipidemia Maternal Grandmother    Hypertension Maternal Grandmother    Mental illness Maternal Grandmother    Heart disease Paternal Grandfather    Hyperlipidemia Paternal Grandfather    Hypertension Paternal Grandfather    Stroke Paternal Grandfather    Alzheimer's disease Paternal Grandmother    Allergic rhinitis Neg Hx     Angioedema Neg Hx    Eczema Neg Hx    Urticaria Neg Hx    Immunodeficiency Neg Hx    Colon cancer Neg Hx  Esophageal cancer Neg Hx     Review of Systems  Constitutional:  Negative for appetite change, chills, fatigue, fever and unexpected weight change.  HENT:   Negative for hearing loss, lump/mass and trouble swallowing.   Eyes:  Negative for eye problems and icterus.  Respiratory:  Negative for chest tightness, cough and shortness of breath.   Cardiovascular:  Negative for chest pain, leg swelling and palpitations.  Gastrointestinal:  Negative for abdominal distention, abdominal pain, constipation, diarrhea, nausea and vomiting.  Endocrine: Negative for hot flashes.  Genitourinary:  Negative for difficulty urinating.   Musculoskeletal:  Negative for arthralgias.  Skin:  Negative for itching and rash.  Neurological:  Negative for dizziness, extremity weakness, headaches and numbness.  Hematological:  Negative for adenopathy. Does not bruise/bleed easily.  Psychiatric/Behavioral:  Negative for depression. The patient is not nervous/anxious.       PHYSICAL EXAMINATION   Onc Performance Status - 03/26/23 1100       KPS SCALE   KPS % SCORE Able to carry on normal activity, minor s/s of disease             Vitals:   03/26/23 1056  BP: 117/70  Pulse: 94  Resp: 16  Temp: 97.6 F (36.4 C)  SpO2: 98%    Physical Exam Constitutional:      General: She is not in acute distress.    Appearance: Normal appearance. She is not toxic-appearing.  HENT:     Head: Normocephalic and atraumatic.     Mouth/Throat:     Mouth: Mucous membranes are moist.     Pharynx: Oropharynx is clear. No oropharyngeal exudate or posterior oropharyngeal erythema.  Eyes:     General: No scleral icterus. Cardiovascular:     Rate and Rhythm: Normal rate and regular rhythm.     Pulses: Normal pulses.     Heart sounds: Normal heart sounds.  Pulmonary:     Effort: Pulmonary effort is normal.      Breath sounds: Normal breath sounds.  Chest:     Comments: right breast with  thickening/? Mass noted Abdominal:     General: Abdomen is flat. Bowel sounds are normal. There is no distension.     Palpations: Abdomen is soft.     Tenderness: There is no abdominal tenderness.  Musculoskeletal:        General: No swelling.     Cervical back: Neck supple.  Lymphadenopathy:     Cervical: No cervical adenopathy.     Upper Body:     Right upper body: No axillary adenopathy.     Left upper body: No axillary adenopathy.  Skin:    General: Skin is warm and dry.     Findings: No rash.  Neurological:     General: No focal deficit present.     Mental Status: She is alert.  Psychiatric:        Mood and Affect: Mood normal.        Behavior: Behavior normal.     LABORATORY DATA:  CBC    Component Value Date/Time   WBC 14.2 (H) 10/27/2021 1348   RBC 4.23 10/27/2021 1348   HGB 14.0 10/27/2021 1348   HGB 14.7 05/16/2021 1147   HCT 40.6 10/27/2021 1348   HCT 44.5 05/16/2021 1147   PLT 234 10/27/2021 1348   PLT 245 05/16/2021 1147   MCV 96.0 10/27/2021 1348   MCV 98 (H) 05/16/2021 1147   MCH 33.1 10/27/2021 1348   MCHC 34.5 10/27/2021  1348   RDW 13.5 10/27/2021 1348   RDW 13.8 05/16/2021 1147   LYMPHSABS 1.4 10/27/2021 1348   LYMPHSABS 1.7 05/16/2021 1147   MONOABS 0.8 10/27/2021 1348   EOSABS 0.6 (H) 10/27/2021 1348   EOSABS 0.7 (H) 05/16/2021 1147   BASOSABS 0.0 10/27/2021 1348   BASOSABS 0.0 05/16/2021 1147    CMP     Component Value Date/Time   NA 138 10/27/2021 1348   NA 140 05/16/2021 1147   K 3.9 10/27/2021 1348   CL 106 10/27/2021 1348   CO2 26 10/27/2021 1348   GLUCOSE 97 10/27/2021 1348   BUN 17 10/27/2021 1348   BUN 10 05/16/2021 1147   CREATININE 0.69 10/27/2021 1348   CREATININE 0.77 06/21/2020 1447   CREATININE 0.72 02/08/2015 1358   CALCIUM 8.7 (L) 10/27/2021 1348   PROT 7.0 09/28/2021 2222   PROT 6.2 05/16/2021 1147   ALBUMIN 4.0 09/28/2021 2222    ALBUMIN 4.6 05/16/2021 1147   AST 19 09/28/2021 2222   AST 24 06/21/2020 1447   ALT 16 09/28/2021 2222   ALT 37 06/21/2020 1447   ALKPHOS 80 09/28/2021 2222   BILITOT 0.3 09/28/2021 2222   BILITOT 0.3 05/16/2021 1147   BILITOT 0.3 06/21/2020 1447   GFRNONAA >60 10/27/2021 1348   GFRNONAA >60 06/21/2020 1447   GFRNONAA >89 02/08/2015 1358   GFRAA 111 05/10/2020 1026   GFRAA >89 02/08/2015 1358       ASSESSMENT and THERAPY PLAN:   Ductal carcinoma in situ (DCIS) of right breast Catherine Olsen is a 52 year old woman with history of right breast DCIS status postlumpectomy and urine is a 52 year old woman with history of right breast DCIS status postlumpectomy and adjuvant antiestrogen therapy with tamoxifen.  Adjuvant antiestrogen therapy with tamoxifen.  Breast Mass New palpable mass in the right breast, in the area of prior lumpectomy. The patient reports that the mass feels different and was not present before. -Order diagnostic mammogram and ultrasound of right bresat as soon as possible. -If necessary, follow up with MRI.  RTC based on above results     All questions were answered. The patient knows to call the clinic with any problems, questions or concerns. We can certainly see the patient much sooner if necessary.  Total encounter time:20 minutes*in face-to-face visit time, chart review, lab review, care coordination, order entry, and documentation of the encounter time.    Lillard Anes, NP 03/26/23 2:09 PM Medical Oncology and Hematology [facility] [address] [place], [place] [address] Tel. [phone]    Fax. [phone]  *Total Encounter Time as defined by the Centers for Medicare and Medicaid Services includes, in addition to the face-to-face time of a patient visit (documented in the note above) non-face-to-face time: obtaining and reviewing outside history, ordering and reviewing medications, tests or procedures, care coordination (communications  with other health care professionals or caregivers) and documentation in the medical record.

## 2023-03-26 NOTE — Assessment & Plan Note (Addendum)
Catherine Olsen is a 52 year old woman with history of right breast DCIS status postlumpectomy and urine is a 52 year old woman with history of right breast DCIS status postlumpectomy and adjuvant antiestrogen therapy with tamoxifen.  Adjuvant antiestrogen therapy with tamoxifen.  Breast Mass New palpable mass in the right breast, in the area of prior lumpectomy. The patient reports that the mass feels different and was not present before. -Order diagnostic mammogram and ultrasound of right bresat as soon as possible. -If necessary, follow up with MRI.  RTC based on above results

## 2023-04-06 ENCOUNTER — Telehealth: Payer: Self-pay | Admitting: Hematology and Oncology

## 2023-04-06 NOTE — Telephone Encounter (Signed)
Left patient a message in regards to rescheduled appointment times/dates

## 2023-04-08 ENCOUNTER — Encounter (HOSPITAL_COMMUNITY): Payer: Self-pay

## 2023-04-08 ENCOUNTER — Emergency Department (HOSPITAL_COMMUNITY)
Admission: EM | Admit: 2023-04-08 | Discharge: 2023-04-08 | Discharge status: Against Medical Advice | Payer: Medicare Other | Attending: Emergency Medicine | Admitting: Emergency Medicine

## 2023-04-08 ENCOUNTER — Emergency Department (HOSPITAL_COMMUNITY)
Admission: EM | Admit: 2023-04-08 | Discharge: 2023-04-09 | Discharge status: Against Medical Advice | Payer: Medicare Other | Attending: Emergency Medicine | Admitting: Emergency Medicine

## 2023-04-08 ENCOUNTER — Other Ambulatory Visit: Payer: Self-pay

## 2023-04-08 DIAGNOSIS — Z5321 Procedure and treatment not carried out due to patient leaving prior to being seen by health care provider: Secondary | ICD-10-CM | POA: Insufficient documentation

## 2023-04-08 DIAGNOSIS — R1031 Right lower quadrant pain: Secondary | ICD-10-CM | POA: Insufficient documentation

## 2023-04-08 DIAGNOSIS — R39198 Other difficulties with micturition: Secondary | ICD-10-CM | POA: Insufficient documentation

## 2023-04-08 DIAGNOSIS — M545 Low back pain, unspecified: Secondary | ICD-10-CM | POA: Insufficient documentation

## 2023-04-08 LAB — COMPREHENSIVE METABOLIC PANEL
ALT: 14 U/L (ref 0–44)
AST: 17 U/L (ref 15–41)
Albumin: 3.6 g/dL (ref 3.5–5.0)
Alkaline Phosphatase: 60 U/L (ref 38–126)
Anion gap: 6 (ref 5–15)
BUN: 16 mg/dL (ref 6–20)
CO2: 23 mmol/L (ref 22–32)
Calcium: 8.8 mg/dL — ABNORMAL LOW (ref 8.9–10.3)
Chloride: 108 mmol/L (ref 98–111)
Creatinine, Ser: 0.78 mg/dL (ref 0.44–1.00)
GFR, Estimated: >60 mL/min (ref >60)
Glucose, Bld: 107 mg/dL — ABNORMAL HIGH (ref 70–99)
Potassium: 4.2 mmol/L (ref 3.5–5.1)
Sodium: 137 mmol/L (ref 135–145)
Total Bilirubin: 0.5 mg/dL (ref 0.0–1.2)
Total Protein: 6.5 g/dL (ref 6.5–8.1)

## 2023-04-08 LAB — CBC
HCT: 45.5 % (ref 36.0–46.0)
Hemoglobin: 15.5 g/dL — ABNORMAL HIGH (ref 12.0–15.0)
MCH: 31.4 pg (ref 26.0–34.0)
MCHC: 34.1 g/dL (ref 30.0–36.0)
MCV: 92.1 fL (ref 80.0–100.0)
Platelets: 290 10*3/uL (ref 150–400)
RBC: 4.94 MIL/uL (ref 3.87–5.11)
RDW: 14 % (ref 11.5–15.5)
WBC: 8.1 10*3/uL (ref 4.0–10.5)
nRBC: 0 % (ref 0.0–0.2)

## 2023-04-08 LAB — LIPASE, BLOOD: Lipase: 60 U/L — ABNORMAL HIGH (ref 11–51)

## 2023-04-08 MED ORDER — HYDROCODONE-ACETAMINOPHEN 5-325 MG PO TABS
1.0000 | ORAL_TABLET | Freq: Once | ORAL | Status: AC
  Administered 2023-04-08: 1 via ORAL
  Filled 2023-04-08: qty 1

## 2023-04-08 NOTE — ED Triage Notes (Signed)
 Pt c/o right lower quadrant/pelvic pain that has worsened for past 3 days. Pt denies nausea, vomiting, or diarrhea. Pt unsure if she is constipated.

## 2023-04-08 NOTE — ED Notes (Signed)
No answer for ready room 

## 2023-04-08 NOTE — ED Notes (Signed)
 Patient LWBS, dragging OTF.

## 2023-04-09 ENCOUNTER — Encounter (HOSPITAL_BASED_OUTPATIENT_CLINIC_OR_DEPARTMENT_OTHER): Payer: Self-pay | Admitting: Emergency Medicine

## 2023-04-09 ENCOUNTER — Emergency Department (HOSPITAL_BASED_OUTPATIENT_CLINIC_OR_DEPARTMENT_OTHER)
Admission: EM | Admit: 2023-04-09 | Discharge: 2023-04-09 | Disposition: A | Discharge status: Home / Self Care | Payer: Medicare Other | Attending: Emergency Medicine | Admitting: Emergency Medicine

## 2023-04-09 ENCOUNTER — Emergency Department (HOSPITAL_BASED_OUTPATIENT_CLINIC_OR_DEPARTMENT_OTHER): Payer: Medicare Other

## 2023-04-09 ENCOUNTER — Other Ambulatory Visit: Payer: Self-pay

## 2023-04-09 DIAGNOSIS — F17210 Nicotine dependence, cigarettes, uncomplicated: Secondary | ICD-10-CM | POA: Insufficient documentation

## 2023-04-09 DIAGNOSIS — R1031 Right lower quadrant pain: Secondary | ICD-10-CM | POA: Insufficient documentation

## 2023-04-09 DIAGNOSIS — Z853 Personal history of malignant neoplasm of breast: Secondary | ICD-10-CM | POA: Insufficient documentation

## 2023-04-09 DIAGNOSIS — J449 Chronic obstructive pulmonary disease, unspecified: Secondary | ICD-10-CM | POA: Insufficient documentation

## 2023-04-09 DIAGNOSIS — J45909 Unspecified asthma, uncomplicated: Secondary | ICD-10-CM | POA: Diagnosis not present

## 2023-04-09 DIAGNOSIS — R109 Unspecified abdominal pain: Secondary | ICD-10-CM | POA: Diagnosis present

## 2023-04-09 LAB — BASIC METABOLIC PANEL
Anion gap: 5 (ref 5–15)
BUN: 19 mg/dL (ref 6–20)
CO2: 22 mmol/L (ref 22–32)
Calcium: 8.3 mg/dL — ABNORMAL LOW (ref 8.9–10.3)
Chloride: 107 mmol/L (ref 98–111)
Creatinine, Ser: 0.67 mg/dL (ref 0.44–1.00)
GFR, Estimated: >60 mL/min (ref >60)
Glucose, Bld: 113 mg/dL — ABNORMAL HIGH (ref 70–99)
Potassium: 3.7 mmol/L (ref 3.5–5.1)
Sodium: 134 mmol/L — ABNORMAL LOW (ref 135–145)

## 2023-04-09 LAB — URINALYSIS, W/ REFLEX TO CULTURE (INFECTION SUSPECTED)
Bilirubin Urine: NEGATIVE
Glucose, UA: NEGATIVE mg/dL
Hgb urine dipstick: NEGATIVE
Ketones, ur: 20 mg/dL — AB
Nitrite: NEGATIVE
Protein, ur: NEGATIVE mg/dL
Specific Gravity, Urine: 1.025 (ref 1.005–1.030)
pH: 5 (ref 5.0–8.0)

## 2023-04-09 LAB — CBC
HCT: 40.9 % (ref 36.0–46.0)
Hemoglobin: 14 g/dL (ref 12.0–15.0)
MCH: 31.7 pg (ref 26.0–34.0)
MCHC: 34.2 g/dL (ref 30.0–36.0)
MCV: 92.7 fL (ref 80.0–100.0)
Platelets: 247 10*3/uL (ref 150–400)
RBC: 4.41 MIL/uL (ref 3.87–5.11)
RDW: 14.2 % (ref 11.5–15.5)
WBC: 7 10*3/uL (ref 4.0–10.5)
nRBC: 0 % (ref 0.0–0.2)

## 2023-04-09 MED ORDER — ACETAMINOPHEN 500 MG PO TABS: 1000.0000 mg | ORAL_TABLET | Freq: Once | ORAL | Status: DC

## 2023-04-09 MED ORDER — KETOROLAC TROMETHAMINE 15 MG/ML IJ SOLN: 30.0000 mg | Freq: Once | INTRAMUSCULAR | Status: DC

## 2023-04-09 MED ORDER — IOHEXOL 300 MG/ML  SOLN
100.0000 mL | Freq: Once | INTRAMUSCULAR | Status: AC | PRN
  Administered 2023-04-09: 80 mL via INTRAVENOUS

## 2023-04-09 NOTE — ED Triage Notes (Signed)
 Pt reports right sided flank pain and abdominal pain x 3 days. Reports she was sent from [facility] for potential appendicitis and wants an CT scan.

## 2023-04-09 NOTE — ED Triage Notes (Signed)
 Pt reports right lower abd pain and right lower back pain x 3 days, pt reports seen at [facility] and they want her to come to the ER then but she didn't, want to for r/o appendicitis. Pt endorses urinating less frequently and more difficult to empty bladder, pt reports she may have UTI.

## 2023-04-09 NOTE — ED Notes (Signed)
 Pt stated she was in pain and refused to wait any longer. Pt left

## 2023-04-09 NOTE — Discharge Instructions (Addendum)
 Your labs and CT imaging were normal.  Your ultrasound showed a cyst on your ovary which could be endometriosis.  You can follow-up with OB/GYN about this.  I have included the telephone number for the OB/GYN group that is in this building.  You can call them today to set up an appointment.   You can take Motrin  and Tylenol  for this at home.  Am sorry that we were not able to identify a cause for your symptoms today.  Please follow-up with your PCP and your OB/GYN within 1 to 2 weeks.

## 2023-04-09 NOTE — ED Notes (Signed)
 Pt discharged home and given discharge paperwork. Opportunities given for questions. Pt verbalizes understanding. PIV removed by patient.  Jillyn Hidden , RN

## 2023-04-09 NOTE — ED Notes (Addendum)
 Pt refused to have vitals taken. Removed her own IV. Pt requesting director's number and upset with her care as no reason found for her abdominal pain. Refused pain medication that was ordered. Physiological scientist aware.

## 2023-04-09 NOTE — ED Provider Notes (Addendum)
 [place] EMERGENCY DEPARTMENT AT [facility] Provider Note  CSN: [id number] Arrival date & time: 04/09/23 9646  Chief Complaint(s) Abdominal Pain and Flank Pain  HPI Catherine Olsen is a 53 y.o. female here today for 4 days of abdominal pain.  Patient has been to 2 emergency departments over the last couple of days but has left because the wait time was too long, went to an urgent care yesterday who said that she might have appendicitis and recommended she go to the emergency department.  She denies fever, chills, vomiting or diarrhea.  Patient had blood work done yesterday.   Past Medical History Past Medical History:  Diagnosis Date   Abnormal uterine bleeding (AUB) 06/25/2009   Qualifier: Diagnosis of  By: Twanna MD, Ankit     Allergy    Angio-edema    Anxiety    Arthritis    hands, lower back, knees   Asthma    Bipolar 1 disorder (HCC)    Breast cancer (HCC)    stage 0   Chronic prescription benzodiazepine use 09/08/2018   COPD (chronic obstructive pulmonary disease) (HCC)    Depression    Endometriosis    Fibromyalgia    GERD (gastroesophageal reflux disease)    Headache(784.0)    otc med prn   Migraine    Pituitary tumor    microadenoma   PONV (postoperative nausea and vomiting)    PTSD (post-traumatic stress disorder)    Scoliosis    Suicidal ideation 01/05/2021   Termination of pregnancy    x 2 at age 49 and 53 yrs old   Tobacco abuse 03/04/2015   Urticaria    Patient Active Problem List   Diagnosis Date Noted   Therapeutic drug monitoring 05/16/2021   Atherosclerosis 05/16/2021   Anorexia 05/16/2021   Weight loss 05/16/2021   Bipolar affective disorder, mixed, severe, with psychotic behavior (HCC) 01/05/2021   Cervical spondylosis 10/16/2020   Bipolar 1 disorder, depressed, severe (HCC) 08/12/2020   Breast cancer (HCC)    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   Cocaine abuse (HCC) 09/08/2018   History of suicidal ideation  09/08/2018   Other allergic rhinitis 02/02/2018   Allergy, unspecified, subsequent encounter 02/02/2018   Mild intermittent asthma without complication 02/02/2018   Angio-edema 02/02/2018   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   Gastroesophageal reflux disease without esophagitis 02/02/2018   Chronic dental pain 01/20/2018   Chronic facial pain 01/20/2018   Environmental allergies 10/29/2017   Pain in joint of left shoulder 07/15/2017   Neck pain 07/15/2017   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   Chronic migraine without aura without status migrainosus, not intractable 03/24/2017   Chronic midline low back pain without sciatica 03/24/2017   Pulmonary emphysema (HCC) 02/19/2017   Jaw pain 02/11/2017   Tobacco use disorder 12/09/2016   Exertional dyspnea 12/09/2016   Family history of alpha 1 antitrypsin deficiency 09/04/2016   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   Nodule of left lung 09/04/2016   Cervical lymphadenopathy 01/29/2016   Perimenopausal vasomotor symptoms 05/25/2015   Chronic rhinitis 04/18/2015   Pituitary microadenoma (HCC) 05/19/2014   Endometriosis of ovary 04/03/2014   Ovarian cyst, right 04/03/2014   Posttraumatic stress disorder 09/13/2012   Fibrocystic breast disease 08/24/2012   Myalgia and myositis 07/26/2012   Depression 07/26/2012   PTSD (post-traumatic stress disorder) 01/09/2009   Insomnia 01/17/2008   NEVI, MULTIPLE 07/17/2006   KERATOSIS, SEBORRHEIC NEC 07/17/2006   ACNE NEC 07/17/2006   Home Medication(s) Prior to  Admission medications   Medication Sig Start Date End Date Taking? Authorizing Provider  albuterol  (VENTOLIN  HFA) 108 (90 Base) MCG/ACT inhaler Inhale 2 puffs into the lungs every 4 (four) hours as needed for wheezing or shortness of breath. 05/16/21   Brien Belvie BRAVO, MD  clonazePAM  (KLONOPIN ) 0.5 MG tablet Take 0.5 mg by mouth at bedtime. 08/31/21   [provider]  EPINEPHrine  0.3 mg/0.3 mL IJ SOAJ injection Inject 0.3 mg  into the skin as needed for anaphylaxis. 05/16/21   Brien Belvie BRAVO, MD  ibuprofen  (ADVIL ) 800 MG tablet TAKE 1 TABLET(800 MG) BY MOUTH EVERY 8 HOURS AS NEEDED 03/20/23   Yolande Lamar BROCKS, MD  tamoxifen  (NOLVADEX ) 20 MG tablet TAKE 1 TABLET IN THE EVENING 12/29/22   Odean Potts, MD                                                                                                                                    Past Surgical History Past Surgical History:  Procedure Laterality Date   BREAST BIOPSY Right 05/19/2007   BREAST BIOPSY Right 06/02/2007   BREAST BIOPSY  06/29/2019   BREAST EXCISIONAL BIOPSY     BREAST LUMPECTOMY Right 07/21/2019   BREAST LUMPECTOMY WITH RADIOACTIVE SEED LOCALIZATION Right 07/21/2019   Procedure: RIGHT BREAST LUMPECTOMY WITH RADIOACTIVE SEED LOCALIZATION;  Surgeon: Ethyl Lenis, MD;  Location: [facility];  Service: General;  Laterality: Right;   BREAST SURGERY     DILATION AND CURETTAGE OF UTERUS     FACIAL COSMETIC SURGERY     right cheek   LAPAROSCOPY Right 11/11/2013   Procedure: LAPAROSCOPY OPERATIVE with  Drainage of  RIGHT Ovarian ENDOMETRIOMA;  Surgeon: Robbi JONELLE Render, MD;  Location: [facility];  Service: Gynecology;  Laterality: Right;   NASAL ENDOSCOPY     said it showed some acid reflux from ENT   NOSE SURGERY     rhinoplasty at age 2 yrs   WISDOM TOOTH EXTRACTION     Family History Family History  Problem Relation Age of Onset   Bipolar disorder Mother    Diabetes Mother    Hypertension Mother    Stroke Mother 4       CVA   Mental illness Mother        no diagnosis; personality disorder   Bipolar disorder Father    Hyperlipidemia Father    Hypertension Father    COPD Father    Alpha-1 antitrypsin deficiency Father    Asthma Father    Alpha-1 antitrypsin deficiency Brother    Asthma Brother    Heart disease Maternal Grandfather    Hyperlipidemia Maternal Grandfather    Hypertension Maternal Grandfather    Asthma Maternal  Grandfather    Bipolar disorder Maternal Grandmother    Depression Maternal Grandmother    Diabetes Maternal Grandmother    Heart disease Maternal Grandmother    Hyperlipidemia Maternal Grandmother  Hypertension Maternal Grandmother    Mental illness Maternal Grandmother    Heart disease Paternal Grandfather    Hyperlipidemia Paternal Grandfather    Hypertension Paternal Grandfather    Stroke Paternal Grandfather    Alzheimer's disease Paternal Grandmother    Allergic rhinitis Neg Hx    Angioedema Neg Hx    Eczema Neg Hx    Urticaria Neg Hx    Immunodeficiency Neg Hx    Colon cancer Neg Hx    Esophageal cancer Neg Hx     Social History Social History   Tobacco Use   Smoking status: Every Day    Current packs/day: 0.00    Average packs/day: 0.5 packs/day for 29.0 years (14.5 ttl pk-yrs)    Types: Cigarettes    Start date: 10/08/1990    Last attempt to quit: 10/08/2019    Years since quitting: 3.5   Smokeless tobacco: Never  Vaping Use   Vaping status: Former  Substance Use Topics   Alcohol use: Not Currently    Alcohol/week: 2.0 standard drinks of alcohol    Types: 2 Cans of beer per week    Comment: 6 nights   Drug use: Not Currently    Types: Crack cocaine    Comment: 07/19/2019   Allergies Other, Prednisone , Sulfamethoxazole, Oxycodone , Abilify  [aripiprazole ], Celebrex  [celecoxib ], Misc. sulfonamide containing compounds, Molds & smuts, Penicillins, Tylenol  [acetaminophen ], Lamictal  [lamotrigine ], Percocet [oxycodone -acetaminophen ], Sulfa antibiotics, and Sulfites  Review of Systems Review of Systems  Physical Exam Vital Signs  I have reviewed the triage vital signs BP 120/80   Pulse 80   Temp 98 F (36.7 C) (Oral)   Resp 16   LMP 11/21/2014   SpO2 96%   Physical Exam Vitals reviewed.  Abdominal:     General: Abdomen is flat.     Palpations: Abdomen is soft. There is no fluid wave.     Tenderness: There is no abdominal tenderness.     Hernia: No  hernia is present.  Neurological:     Mental Status: She is alert.     ED Results and Treatments Labs (all labs ordered are listed, but only abnormal results are displayed) Labs Reviewed - No data to display                                                                                                                        Radiology CT ABDOMEN PELVIS W CONTRAST Result Date: 04/09/2023 CLINICAL DATA:  53 year old female with history of right-sided flank and abdominal pain for the past 3 days. History of breast cancer. * Tracking Code: BO * EXAM: CT ABDOMEN AND PELVIS WITH CONTRAST TECHNIQUE: Multidetector CT imaging of the abdomen and pelvis was performed using the standard protocol following bolus administration of intravenous contrast. RADIATION DOSE REDUCTION: This exam was performed according to the departmental dose-optimization program which includes automated exposure control, adjustment of the mA and/or kV according to patient size and/or use of iterative reconstruction technique. CONTRAST:  80mL OMNIPAQUE  IOHEXOL  300  MG/ML  SOLN COMPARISON:  No priors. FINDINGS: Lower chest: Well-circumscribed low-attenuation lesion adjacent to the right atrium (axial image 3 of series 2) measuring 2.1 x 1.4 cm (14 HU), similar in retrospect compared to the prior examination, likely a pericardial cyst (no imaging follow-up recommended). Hepatobiliary: No suspicious cystic or solid hepatic lesions. No intra or extrahepatic biliary ductal dilatation. Gallbladder is unremarkable in appearance. Pancreas: No pancreatic mass. No pancreatic ductal dilatation. No pancreatic or peripancreatic fluid collections or inflammatory changes. Spleen: Unremarkable. Adrenals/Urinary Tract: Bilateral kidneys and bilateral adrenal glands are normal in appearance. No hydroureteronephrosis. Urinary bladder is unremarkable in appearance. Stomach/Bowel: The appearance of the stomach is normal. No pathologic dilatation of small bowel or  colon. Normal appendix. Vascular/Lymphatic: Atherosclerotic calcifications throughout the abdominal aorta and pelvic vasculature. No lymphadenopathy noted in the abdomen or pelvis. Reproductive: Uterus and left ovary are unremarkable in appearance. Prominence of the right ovary is again noted measuring 3.7 x 3.0 x 2.5 cm, similar to prior study from 2021, presumably benign. Other: No significant volume of ascites.  No pneumoperitoneum. Musculoskeletal: There are no aggressive appearing lytic or blastic lesions noted in the visualized portions of the skeleton. IMPRESSION: 1. No acute findings are noted in the abdomen or pelvis to account for the patient's symptoms. 2. Aortic atherosclerosis. 3. Additional incidental findings, as above. Electronically Signed   By: Toribio Aye M.D.   On: 04/09/2023 05:48    Pertinent labs & imaging results that were available during my care of the patient were reviewed by me and considered in my medical decision making (see MDM for details).  Medications Ordered in ED Medications  ketorolac  (TORADOL ) 15 MG/ML injection 30 mg (has no administration in time range)  acetaminophen  (TYLENOL ) tablet 1,000 mg (has no administration in time range)  iohexol  (OMNIPAQUE ) 300 MG/ML solution 100 mL (80 mLs Intravenous Contrast Given 04/09/23 0448)                                                                                                                                     Procedures Procedures  (including critical care time)  Medical Decision Making / ED Course   This patient presents to the ED for concern of abdominal pain, this involves an extensive number of treatment options, and is a complaint that carries with it a high risk of complications and morbidity.  The differential diagnosis includes gastritis, endometriosis, colitis, less likely cystitis, less likely pyelonephritis, less likely aortic pathology.  MDM: Patient endorses pain in her right lower quadrant.   She says it has been a stabbing pain.  She was initially concerned about her appendix, and is also concerned about her history of endometriosis.  I reviewed the patient's labs from yesterday.  No leukocytosis, normal renal function, no UTI.  Reviewed the patient CT imaging which was normal.  Abdomen soft, nondistended, was unable to elicit tenderness.  Discussed patient's findings with her after  examining her.  Explained to the patient how sometimes we can experience pain, and her imaging while imperfect is pretty good at detecting emergent life-threatening conditions.  Patient was concerned that this could be her endometriosis and she could have an endometrioma.  Advised patient that we would treat her with NSAIDs and Tylenol  which is standard of care for this condition, and advised her to follow-up with her OB/GYN.  Patient became rather displeased that we had not been able to give her an explanation for her pain and insisted upon a pelvic ultrasound to rule out endometriosis.  9:55 AM-ultrasound imaging shows endometrioma versus hemorrhagic cyst.  No torsion.  Consistent with the patient's history.  Entered the room, patient asleep.  Counseled the patient on her imaging findings.  Discussed with her typical management of endometriosis pain which included appropriate dose of NSAIDs, Tylenol .  As the Patient, She Was Taking Her Ibuprofen , She Only That She Was Taking 800 Mg.  Discussed with the Patient How Typically Therapeutic Value of Ibuprofen  plateaus at 400, the patient told me that you do not know what you are talking about.  She has follow-up with OB/GYN.  Will discharge home.  Additional history obtained: -Additional history obtained from  -External records from outside source obtained and reviewed including: Chart review including previous notes, labs, imaging, consultation notes   Lab Tests: -I ordered, reviewed, and interpreted labs.   The pertinent results include:   Labs  Reviewed - No data to display    EKG   EKG Interpretation Date/Time:    Ventricular Rate:    PR Interval:    QRS Duration:    QT Interval:    QTC Calculation:   R Axis:      Text Interpretation:           Imaging Studies ordered: I ordered imaging studies including CT imaging the abdomen pelvis I independently visualized and interpreted imaging. I agree with the radiologist interpretation   Medicines ordered and prescription drug management: Meds ordered this encounter  Medications   iohexol  (OMNIPAQUE ) 300 MG/ML solution 100 mL   ketorolac  (TORADOL ) 15 MG/ML injection 30 mg   acetaminophen  (TYLENOL ) tablet 1,000 mg    -I have reviewed the patients home medicines and have made adjustments as needed   Cardiac Monitoring: The patient was maintained on a cardiac monitor.  I personally viewed and interpreted the cardiac monitored which showed an underlying rhythm of:   Social Determinants of Health:  Factors impacting patients care include:    Reevaluation: After the interventions noted above, I reevaluated the patient and found that they have :improved  Co morbidities that complicate the patient evaluation  Past Medical History:  Diagnosis Date   Abnormal uterine bleeding (AUB) 06/25/2009   Qualifier: Diagnosis of  By: Twanna MD, Ankit     Allergy    Angio-edema    Anxiety    Arthritis    hands, lower back, knees   Asthma    Bipolar 1 disorder (HCC)    Breast cancer (HCC)    stage 0   Chronic prescription benzodiazepine use 09/08/2018   COPD (chronic obstructive pulmonary disease) (HCC)    Depression    Endometriosis    Fibromyalgia    GERD (gastroesophageal reflux disease)    Headache(784.0)    otc med prn   Migraine    Pituitary tumor    microadenoma   PONV (postoperative nausea and vomiting)    PTSD (post-traumatic stress disorder)    Scoliosis  Suicidal ideation 01/05/2021   Termination of pregnancy    x 2 at age 29 and 53 yrs old   Tobacco  abuse 03/04/2015   Urticaria       Dispostion: Discharge and follow-up with PCP.     Final Clinical Impression(s) / ED Diagnoses Final diagnoses:  Right lower quadrant abdominal pain     @PCDICTATION @     Mannie Pac T, DO 04/09/23 1026    Mannie Pac T, DO 04/09/23 1027

## 2023-04-21 ENCOUNTER — Telehealth: Payer: Self-pay
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
## 2023-04-21 NOTE — Progress Notes (Signed)
 Transition Care Management Unsuccessful Follow-up Telephone Call  Date of discharge and from where:  [facility]  Attempts:  1st Attempt  Reason for unsuccessful TCM follow-up call:  No answer/busy   [facility]  [facility], [facility] Guide, Phone: [phone] Website: [email or web address]

## 2023-04-23 ENCOUNTER — Other Ambulatory Visit: Payer: Self-pay | Admitting: Hematology and Oncology

## 2023-05-03 ENCOUNTER — Emergency Department (HOSPITAL_BASED_OUTPATIENT_CLINIC_OR_DEPARTMENT_OTHER)
Admission: EM | Admit: 2023-05-03 | Discharge: 2023-05-03 | Discharge status: Against Medical Advice | Payer: Medicare Other | Attending: Emergency Medicine | Admitting: Emergency Medicine

## 2023-05-03 ENCOUNTER — Encounter (HOSPITAL_BASED_OUTPATIENT_CLINIC_OR_DEPARTMENT_OTHER): Payer: Self-pay | Admitting: Emergency Medicine

## 2023-05-03 ENCOUNTER — Other Ambulatory Visit: Payer: Self-pay

## 2023-05-03 DIAGNOSIS — R2242 Localized swelling, mass and lump, left lower limb: Secondary | ICD-10-CM | POA: Insufficient documentation

## 2023-05-03 DIAGNOSIS — Z5321 Procedure and treatment not carried out due to patient leaving prior to being seen by health care provider: Secondary | ICD-10-CM | POA: Insufficient documentation

## 2023-05-03 NOTE — ED Triage Notes (Signed)
Pt reports soft mass behind LT knee; sts it is tender; she is concerned that it is an aneurysm

## 2023-05-04 ENCOUNTER — Encounter: Payer: Self-pay | Admitting: Hematology and Oncology

## 2023-05-04 ENCOUNTER — Emergency Department (HOSPITAL_BASED_OUTPATIENT_CLINIC_OR_DEPARTMENT_OTHER)
Admission: EM | Admit: 2023-05-04 | Discharge: 2023-05-04 | Discharge status: Against Medical Advice | Payer: Medicare Other | Attending: Emergency Medicine | Admitting: Emergency Medicine

## 2023-05-04 DIAGNOSIS — Z5321 Procedure and treatment not carried out due to patient leaving prior to being seen by health care provider: Secondary | ICD-10-CM | POA: Diagnosis not present

## 2023-05-04 DIAGNOSIS — R2242 Localized swelling, mass and lump, left lower limb: Secondary | ICD-10-CM | POA: Diagnosis present

## 2023-05-04 NOTE — ED Triage Notes (Signed)
Pt POV steady gait- reports intermittent anterior knee swelling L knee after bumping furniture x1 week.  Now reports swelling behind L knee x 3 days. Reports intermittent parathesia in LLE. Intermittent leg cramping x1 day.

## 2023-05-05 ENCOUNTER — Other Ambulatory Visit: Payer: Self-pay

## 2023-05-05 ENCOUNTER — Other Ambulatory Visit: Payer: Self-pay | Admitting: *Deleted

## 2023-05-05 ENCOUNTER — Ambulatory Visit (HOSPITAL_COMMUNITY)
Admission: RE | Admit: 2023-05-05 | Discharge: 2023-05-05 | Disposition: A | Discharge status: Home / Self Care | Payer: Medicare Other | Source: Ambulatory Visit | Attending: Physician Assistant | Admitting: Physician Assistant

## 2023-05-05 ENCOUNTER — Ambulatory Visit (HOSPITAL_COMMUNITY): Admission: RE | Admit: 2023-05-05 | Payer: Medicare Other | Source: Ambulatory Visit

## 2023-05-05 ENCOUNTER — Encounter: Payer: Medicare Other | Admitting: Physician Assistant

## 2023-05-05 DIAGNOSIS — M7989 Other specified soft tissue disorders: Secondary | ICD-10-CM | POA: Insufficient documentation

## 2023-05-05 DIAGNOSIS — D0511 Intraductal carcinoma in situ of right breast: Secondary | ICD-10-CM

## 2023-05-05 NOTE — Progress Notes (Signed)
Received mychart message from pt with complaint of left leg swelling and pain behind left knee.  Pt on Tamoxifen, per MD pt needing to be seen by [facility]. Verbal orders received from [facility] for pt to undergo left leg vas [place] to r/o DVT. Orders placed.  Appt scheduled.  RN attempt x1 to contact pt with no answer. Unable to LVM due to VM being full. RN sent mychart message with appt details.

## 2023-05-05 NOTE — Progress Notes (Deleted)
Symptom Management Consult Note  Cancer Center    Patient Care Team: Pcp, No as PCP - General Serena Croissant, MD as PCP - Hematology/Oncology (Hematology and Oncology) Services, [facility] as Referring Physician Lenice Llamas, MD as Referring Physician (Gynecology) Christia Reading, MD as Consulting Physician (Otolaryngology) Raynelle Highland as Consulting Physician (Neurosurgery)    Name / MRN / DOB: Catherine Olsen  [id number]  Jul 16, 1970   Date of visit: 05/05/2023   Chief Complaint/Reason for visit: ***   Current Therapy:   Last treatment:  Day ***   Cycle *** on   ASSESSMENT & PLAN: Patient is a 53 y.o. 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 Pamelia Hoit.  I have viewed most recent oncology note and lab work.    #Ductal carcinoma in situ (DCIS) of right breast  - Next appointment with oncologist is   #     Strict ED precautions discussed should symptoms worsen.   Heme/Onc History: Oncology History  Ductal carcinoma in situ (DCIS) of right breast  06/29/2019 Initial Diagnosis   Right breast biopsy: Fibrocystic changes, pseudoangiomatous stromal hyperplasia   07/21/2019 Surgery   Right lumpectomy: Dr. Ezzard Standing: Intermediate grade DCIS 0.2 cm, 0.1 cm from anterior margin, LCIS, ER 95%, PR 30% Tis NX stage 0   07/21/2019 Cancer Staging   Staging form: Breast, AJCC 8th Edition - Pathologic stage from 07/21/2019: Stage 0 (pTis (DCIS), pN0, cM0, ER+, PR+) - Signed by Loa Socks, NP on 08/03/2019   07/05/2020 -  Anti-estrogen oral therapy   10 mg of tamoxifen daily        Interval history-: Discussed the use of AI scribe software for clinical note transcription with the patient, who gave verbal consent to proceed.   Catherine Olsen is a 53 y.o. female with oncologic history as above presenting to [facility] today with chief complaint of  Patient presents unaccompanied to visit today. Patient  is accompanied by family member who provides additional history.    ROS  All other systems are reviewed and are negative for acute change except as noted in the HPI.    Allergies  Allergen Reactions  . Other Anaphylaxis    Idiopathic (possible binder or preservatives in medication)    . Prednisone Anaphylaxis    Steroids Steroids ( can take with benadryl )   Can not take higher than 40 mg   . Sulfamethoxazole Anaphylaxis  . Oxycodone Other (See Comments)    Severe blindness?  . Abilify [Aripiprazole]     Generic Abilify--causes severe neurological problems. Can use name brand  . Celebrex [Celecoxib]   . Misc. Sulfonamide Containing Compounds   . Molds & Smuts Other (See Comments)    Environmental  . Penicillins Hives    Has patient had a PCN reaction causing immediate rash, facial/tongue/throat swelling, SOB or lightheadedness with hypotension: no Has patient had a PCN reaction causing severe rash involving mucus membranes or skin necrosis: NO Has patient had a PCN reaction that required hospitalizationNO Has patient had a PCN reaction occurring within the last 10 years:NO If all of the above answers are "NO", then may proceed with Cephalosporin use.   . Tylenol [Acetaminophen]     Stomach pain  . Lamictal [Lamotrigine] Rash  . Percocet [Oxycodone-Acetaminophen]     Vision loss  . Sulfa Antibiotics Hives    Pt states she is not really allergic to sulfa, but to sulfites.  Marland Kitchen  Sulfites Other (See Comments)    unknown     Past Medical History:  Diagnosis Date  . Abnormal uterine bleeding (AUB) 06/25/2009   Qualifier: Diagnosis of  By: Eben Burow MD, Ankit    . Allergy   . Angio-edema   . Anxiety   . Arthritis    hands, lower back, knees  . Asthma   . Bipolar 1 disorder (HCC)   . Breast cancer (HCC)    stage 0  . Chronic prescription benzodiazepine use 09/08/2018  . COPD (chronic obstructive pulmonary disease) (HCC)   . Depression   . Endometriosis   . Fibromyalgia    . GERD (gastroesophageal reflux disease)   . Headache(784.0)    otc med prn  . Migraine   . Pituitary tumor    microadenoma  . PONV (postoperative nausea and vomiting)   . PTSD (post-traumatic stress disorder)   . Scoliosis   . Suicidal ideation 01/05/2021  . Termination of pregnancy    x 2 at age 72 and 53 yrs old  . Tobacco abuse 03/04/2015  . Urticaria      Past Surgical History:  Procedure Laterality Date  . BREAST BIOPSY Right 05/19/2007  . BREAST BIOPSY Right 06/02/2007  . BREAST BIOPSY  06/29/2019  . BREAST EXCISIONAL BIOPSY    . BREAST LUMPECTOMY Right 07/21/2019  . BREAST LUMPECTOMY WITH RADIOACTIVE SEED LOCALIZATION Right 07/21/2019   Procedure: RIGHT BREAST LUMPECTOMY WITH RADIOACTIVE SEED LOCALIZATION;  Surgeon: Ovidio Kin, MD;  Location: [facility];  Service: General;  Laterality: Right;  . BREAST SURGERY    . DILATION AND CURETTAGE OF UTERUS    . FACIAL COSMETIC SURGERY     right cheek  . LAPAROSCOPY Right 11/11/2013   Procedure: LAPAROSCOPY OPERATIVE with  Drainage of  RIGHT Ovarian ENDOMETRIOMA;  Surgeon: Robley Fries, MD;  Location: [facility];  Service: Gynecology;  Laterality: Right;  . NASAL ENDOSCOPY     said it showed some acid reflux from ENT  . NOSE SURGERY     rhinoplasty at age 60 yrs  . WISDOM TOOTH EXTRACTION      Social History   Socioeconomic History  . Marital status: Divorced    Spouse name: Not on file  . Number of children: 0  . Years of education: Not on file  . Highest education level: Bachelor's degree (e.g., BA, AB, BS)  Occupational History  . Occupation: disability    Comment: mental illness  Tobacco Use  . Smoking status: Every Day    Current packs/day: 0.00    Average packs/day: 0.5 packs/day for 29.0 years (14.5 ttl pk-yrs)    Types: Cigarettes    Start date: 10/08/1990    Last attempt to quit: 10/08/2019    Years since quitting: 3.5  . Smokeless tobacco: Never  Vaping Use  . Vaping status: Former   Substance and Sexual Activity  . Alcohol use: Not Currently    Alcohol/week: 2.0 standard drinks of alcohol    Types: 2 Cans of beer per week    Comment: 6 nights  . Drug use: Not Currently    Types: "Crack" cocaine    Comment: 07/19/2019  . Sexual activity: Not Currently    Comment: abortion at 89 and 44yrs. of age   Other Topics Concern  . Not on file  Social History Narrative   Marital status: divorced; not dating      Children: none      Lives: alone with mom  Employment: disability for mental illness in 1997.  Hospitalizations x 9 in past.      Tobacco: 1 ppd x since age 76. Decreasing in 2018.      Alcohol: socially; beers.      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      Exercise: yoga daily; exercise daily.      ADLs: independent with ADLs; no car; depends on others for transportation.      Patient is right-handed. She is divorced and lives with her mother. She drinks 2-3 cups of 1/2 caffeine coffe a day. She walks occasionally for exercise.   Social Drivers of Corporate investment banker Strain: Not on file  Food Insecurity: Not on file  Transportation Needs: Not on file  Physical Activity: Not on file  Stress: Not on file  Social Connections: Unknown (02/10/2023)   Received from [facility]   Social Network   . Social Network: Not on file  Intimate Partner Violence: Unknown (02/10/2023)   Received from [facility]   HITS   . Physically Hurt: Not on file   . Insult or Talk Down To: Not on file   . Threaten Physical Harm: Not on file   . Scream or Curse: Not on file    Family History  Problem Relation Age of Onset  . Bipolar disorder Mother   . Diabetes Mother   . Hypertension Mother   . Stroke Mother 22       CVA  . Mental illness Mother        no diagnosis; personality disorder  . Bipolar disorder Father   . Hyperlipidemia Father   . Hypertension Father   . COPD Father   . Alpha-1 antitrypsin deficiency Father   .  Asthma Father   . Alpha-1 antitrypsin deficiency Brother   . Asthma Brother   . Heart disease Maternal Grandfather   . Hyperlipidemia Maternal Grandfather   . Hypertension Maternal Grandfather   . Asthma Maternal Grandfather   . Bipolar disorder Maternal Grandmother   . Depression Maternal Grandmother   . Diabetes Maternal Grandmother   . Heart disease Maternal Grandmother   . Hyperlipidemia Maternal Grandmother   . Hypertension Maternal Grandmother   . Mental illness Maternal Grandmother   . Heart disease Paternal Grandfather   . Hyperlipidemia Paternal Grandfather   . Hypertension Paternal Grandfather   . Stroke Paternal Grandfather   . Alzheimer's disease Paternal Grandmother   . Allergic rhinitis Neg Hx   . Angioedema Neg Hx   . Eczema Neg Hx   . Urticaria Neg Hx   . Immunodeficiency Neg Hx   . Colon cancer Neg Hx   . Esophageal cancer Neg Hx      Current Outpatient Medications:  .  albuterol (VENTOLIN HFA) 108 (90 Base) MCG/ACT inhaler, Inhale 2 puffs into the lungs every 4 (four) hours as needed for wheezing or shortness of breath., Disp: 18 g, Rfl: 1 .  clonazePAM (KLONOPIN) 0.5 MG tablet, Take 0.5 mg by mouth at bedtime., Disp: , Rfl:  .  clonazePAM (KLONOPIN) 1 MG tablet, Take 1 mg by mouth at bedtime., Disp: , Rfl:  .  EPINEPHrine 0.3 mg/0.3 mL IJ SOAJ injection, Inject 0.3 mg into the skin as needed for anaphylaxis., Disp: 1 each, Rfl: 0 .  ibuprofen (ADVIL) 800 MG tablet, TAKE 1 TABLET(800 MG) BY MOUTH EVERY 8 HOURS AS NEEDED, Disp: 30 tablet, Rfl: 0 .  tamoxifen (NOLVADEX) 20 MG tablet, TAKE  1 TABLET(20 MG) BY MOUTH EVERY EVENING FOR BREAST CANCER, Disp: 90 tablet, Rfl: 1 .  VRAYLAR 1.5 MG capsule, Take 1.5 mg by mouth daily., Disp: , Rfl:   PHYSICAL EXAM: ECOG FS:{CHL ONC ZO:[id number]}   There were no vitals filed for this visit. Physical Exam     LABORATORY DATA: I have reviewed the data as listed    Latest Ref Rng & Units 04/09/2023   12:44 AM 04/08/2023     6:48 AM 10/27/2021    1:48 PM  CBC  WBC 4.0 - 10.5 K/uL 7.0  8.1  14.2   Hemoglobin 12.0 - 15.0 g/dL 81.1  91.4  78.2   Hematocrit 36.0 - 46.0 % 40.9  45.5  40.6   Platelets 150 - 400 K/uL 247  290  234         Latest Ref Rng & Units 04/09/2023   12:44 AM 04/08/2023    6:48 AM 10/27/2021    1:48 PM  CMP  Glucose 70 - 99 mg/dL 956  213  97   BUN 6 - 20 mg/dL 19  16  17    Creatinine 0.44 - 1.00 mg/dL 0.86  5.78  4.69   Sodium 135 - 145 mmol/L 134  137  138   Potassium 3.5 - 5.1 mmol/L 3.7  4.2  3.9   Chloride 98 - 111 mmol/L 107  108  106   CO2 22 - 32 mmol/L 22  23  26    Calcium 8.9 - 10.3 mg/dL 8.3  8.8  8.7   Total Protein 6.5 - 8.1 g/dL  6.5    Total Bilirubin 0.0 - 1.2 mg/dL  0.5    Alkaline Phos 38 - 126 U/L  60    AST 15 - 41 U/L  17    ALT 0 - 44 U/L  14         RADIOGRAPHIC STUDIES (from last 24 hours if applicable) I have personally reviewed the radiological images as listed and agreed with the findings in the report. No results found.      Visit Diagnosis: No diagnosis found.   No orders of the defined types were placed in this encounter.   All questions were answered. The patient knows to call the clinic with any problems, questions or concerns. No barriers to learning was detected.  A total of more than *** minutes were spent on this encounter with face-to-face time and non-face-to-face time, including preparing to see the patient, ordering tests and/or medications, counseling the patient and coordination of care as outlined above.    Thank you for allowing me to participate in the care of this patient.    Shanon Ace, PA-C Department of Hematology/Oncology [facility] at [facility] Phone: [phone]  [phone]    05/05/2023 9:27 AM

## 2023-05-06 ENCOUNTER — Ambulatory Visit (HOSPITAL_COMMUNITY)
Admission: RE | Admit: 2023-05-06 | Discharge: 2023-05-06 | Disposition: A | Discharge status: Home / Self Care | Payer: Medicare Other | Source: Ambulatory Visit | Attending: Physician Assistant | Admitting: Physician Assistant

## 2023-05-06 ENCOUNTER — Ambulatory Visit (HOSPITAL_COMMUNITY): Payer: Medicare Other

## 2023-05-06 ENCOUNTER — Inpatient Hospital Stay: Payer: Medicare Other | Attending: Adult Health | Admitting: Physician Assistant

## 2023-05-06 ENCOUNTER — Encounter (HOSPITAL_COMMUNITY): Payer: Medicare Other

## 2023-05-06 VITALS — BP 108/76 | HR 98 | Temp 98.3°F | Resp 16 | Ht 67.0 in | Wt 152.2 lb

## 2023-05-06 DIAGNOSIS — M25469 Effusion, unspecified knee: Secondary | ICD-10-CM | POA: Diagnosis not present

## 2023-05-06 DIAGNOSIS — M7989 Other specified soft tissue disorders: Secondary | ICD-10-CM | POA: Insufficient documentation

## 2023-05-06 DIAGNOSIS — W2203XA Walked into furniture, initial encounter: Secondary | ICD-10-CM | POA: Diagnosis not present

## 2023-05-06 DIAGNOSIS — Z79810 Long term (current) use of selective estrogen receptor modulators (SERMs): Secondary | ICD-10-CM | POA: Diagnosis not present

## 2023-05-06 DIAGNOSIS — M25562 Pain in left knee: Secondary | ICD-10-CM | POA: Diagnosis not present

## 2023-05-06 DIAGNOSIS — Z170 Estrogen receptor positive status [ER+]: Secondary | ICD-10-CM | POA: Diagnosis not present

## 2023-05-06 DIAGNOSIS — R200 Anesthesia of skin: Secondary | ICD-10-CM | POA: Insufficient documentation

## 2023-05-06 DIAGNOSIS — R262 Difficulty in walking, not elsewhere classified: Secondary | ICD-10-CM | POA: Insufficient documentation

## 2023-05-06 DIAGNOSIS — D0511 Intraductal carcinoma in situ of right breast: Secondary | ICD-10-CM | POA: Diagnosis not present

## 2023-05-06 DIAGNOSIS — M79676 Pain in unspecified toe(s): Secondary | ICD-10-CM | POA: Diagnosis not present

## 2023-05-06 NOTE — Progress Notes (Unsigned)
Symptom Management Consult Note [facility]    Patient Care Team: Pcp, No as PCP - General Serena Croissant, MD as PCP - Hematology/Oncology (Hematology and Oncology) Services, [facility] as Referring Physician Lenice Llamas, MD as Referring Physician (Gynecology) Christia Reading, MD as Consulting Physician (Otolaryngology) Raynelle Highland as Consulting Physician (Neurosurgery)    Name / MRN / DOB: Catherine Olsen  [id number]  1970/12/05   Date of visit: 05/06/2023   Chief Complaint/Reason for visit: left leg pain   Current Therapy: Tamoxifen    ASSESSMENT & PLAN: Patient is a 53 y.o. female with oncologic history of Ductal carcinoma in situ (DCIS) of right breast followed by Dr. Pamelia Hoit.  I have viewed most recent oncology note and lab work.    #Ductal carcinoma in situ (DCIS) of right breast - Next appointment with oncologist is 06/25/23 -Patient tolerating tamoxifen well overall   #Left leg pain -Small mass in left popliteal fossa, non tender. Exam otherwise benign. -DVT study from yesterday is negative.Reading MD did comment on Ultrasound characteristics of mildly enlarged lymph node noted in the groin. Dicussed with Dr. Pamelia Hoit who agrees with PCP follow up. Chart review shows patient had CT scan earlier this month for abdominal and flank pain with thorough work up.  -Negative xray of left knee and tib fib. Encouraged to follow up with ortho. She is an established patient with [facility] and plans to follow up there.   Strict ED precautions discussed should symptoms worsen.   Heme/Onc History: Oncology History  Ductal carcinoma in situ (DCIS) of right breast  06/29/2019 Initial Diagnosis   Right breast biopsy: Fibrocystic changes, pseudoangiomatous stromal hyperplasia   07/21/2019 Surgery   Right lumpectomy: Dr. Ezzard Standing: Intermediate grade DCIS 0.2 cm, 0.1 cm from anterior margin, LCIS, ER 95%, PR 30% Tis NX stage 0   07/21/2019 Cancer Staging    Staging form: Breast, AJCC 8th Edition - Pathologic stage from 07/21/2019: Stage 0 (pTis (DCIS), pN0, cM0, ER+, PR+) - Signed by Loa Socks, NP on 08/03/2019   07/05/2020 -  Anti-estrogen oral therapy   10 mg of tamoxifen daily        Interval history-: Discussed the use of AI scribe software for clinical note transcription with the patient, who gave verbal consent to proceed.   Catherine Olsen is a 54 y.o. female with oncologic history as above presenting to [facility] today with chief complaint of left leg pain. Patient presents unaccompanied to visit today.  The patient presents with leg pain and swelling following a traumatic injury. On January 11th, she sustained a traumatic injury to her leg after hitting it against the corner of a table. Initially, a quarter-sized purple bruise appeared and resolved within a few days, but significant swelling developed two hours post-injury and has persisted. The swelling is accompanied by numbness and difficulty walking.  She has undergone previous evaluations, with initial assessments indicating a damaged vein as the cause of the swelling. However, her condition has deteriorated over the past two and a half weeks, resulting in difficulty with ambulation because of pain. A bulge behind her knee was initially suspected to be a Baker's cyst or deep vein thrombosis, but recent ultrasound imaging ruled out both conditions.  She describes the pain as achy and chronic, worsened by pressure. She takes ibuprofen 800 mg three times daily, a regimen she is accustomed to for back and neck pain, but it has not significantly alleviated her current symptoms.  She is concerned about the persistent swelling and pain, fearing a fracture or infection. She notes generalized calf swelling and tenderness, with pain radiating to the middle and lower leg. No fever is present, and pain is not reproducible, but tenderness is noted in specific regions. She can flex her toes  without pain but experiences pain when weight bearing, particularly in the knee and lower leg.   ROS  All other systems are reviewed and are negative for acute change except as noted in the HPI.    Allergies  Allergen Reactions   Other Anaphylaxis    Idiopathic (possible binder or preservatives in medication)     Prednisone Anaphylaxis    Steroids Steroids ( can take with benadryl )   Can not take higher than 40 mg    Sulfamethoxazole Anaphylaxis   Oxycodone Other (See Comments)    Severe blindness?   Abilify [Aripiprazole]     Generic Abilify--causes severe neurological problems. Can use name brand   Celebrex [Celecoxib]    Misc. Sulfonamide Containing Compounds    Molds & Smuts Other (See Comments)    Environmental   Penicillins Hives    Has patient had a PCN reaction causing immediate rash, facial/tongue/throat swelling, SOB or lightheadedness with hypotension: no Has patient had a PCN reaction causing severe rash involving mucus membranes or skin necrosis: NO Has patient had a PCN reaction that required hospitalizationNO Has patient had a PCN reaction occurring within the last 10 years:NO If all of the above answers are "NO", then may proceed with Cephalosporin use.    Tylenol [Acetaminophen]     Stomach pain   Lamictal [Lamotrigine] Rash   Percocet [Oxycodone-Acetaminophen]     Vision loss   Sulfa Antibiotics Hives    Pt states she is not really allergic to sulfa, but to sulfites.   Sulfites Other (See Comments)    unknown     Past Medical History:  Diagnosis Date   Abnormal uterine bleeding (AUB) 06/25/2009   Qualifier: Diagnosis of  By: Eben Burow MD, Ankit     Allergy    Angio-edema    Anxiety    Arthritis    hands, lower back, knees   Asthma    Bipolar 1 disorder (HCC)    Breast cancer (HCC)    stage 0   Chronic prescription benzodiazepine use 09/08/2018   COPD (chronic obstructive pulmonary disease) (HCC)    Depression    Endometriosis    Fibromyalgia     GERD (gastroesophageal reflux disease)    Headache(784.0)    otc med prn   Migraine    Pituitary tumor    microadenoma   PONV (postoperative nausea and vomiting)    PTSD (post-traumatic stress disorder)    Scoliosis    Suicidal ideation 01/05/2021   Termination of pregnancy    x 2 at age 84 and 53 yrs old   Tobacco abuse 03/04/2015   Urticaria      Past Surgical History:  Procedure Laterality Date   BREAST BIOPSY Right 05/19/2007   BREAST BIOPSY Right 06/02/2007   BREAST BIOPSY  06/29/2019   BREAST EXCISIONAL BIOPSY     BREAST LUMPECTOMY Right 07/21/2019   BREAST LUMPECTOMY WITH RADIOACTIVE SEED LOCALIZATION Right 07/21/2019   Procedure: RIGHT BREAST LUMPECTOMY WITH RADIOACTIVE SEED LOCALIZATION;  Surgeon: Ovidio Kin, MD;  Location: [facility];  Service: General;  Laterality: Right;   BREAST SURGERY     DILATION AND CURETTAGE OF UTERUS     FACIAL COSMETIC  SURGERY     right cheek   LAPAROSCOPY Right 11/11/2013   Procedure: LAPAROSCOPY OPERATIVE with  Drainage of  RIGHT Ovarian ENDOMETRIOMA;  Surgeon: Robley Fries, MD;  Location: [facility];  Service: Gynecology;  Laterality: Right;   NASAL ENDOSCOPY     said it showed some acid reflux from ENT   NOSE SURGERY     rhinoplasty at age 78 yrs   WISDOM TOOTH EXTRACTION      Social History   Socioeconomic History   Marital status: Divorced    Spouse name: Not on file   Number of children: 0   Years of education: Not on file   Highest education level: Bachelor's degree (e.g., BA, AB, BS)  Occupational History   Occupation: disability    Comment: mental illness  Tobacco Use   Smoking status: Every Day    Current packs/day: 0.00    Average packs/day: 0.5 packs/day for 29.0 years (14.5 ttl pk-yrs)    Types: Cigarettes    Start date: 10/08/1990    Last attempt to quit: 10/08/2019    Years since quitting: 3.5   Smokeless tobacco: Never  Vaping Use   Vaping status: Former  Substance and Sexual Activity    Alcohol use: Not Currently    Alcohol/week: 2.0 standard drinks of alcohol    Types: 2 Cans of beer per week    Comment: 6 nights   Drug use: Not Currently    Types: "Crack" cocaine    Comment: 07/19/2019   Sexual activity: Not Currently    Comment: abortion at 65 and 70yrs. of age   Other Topics Concern   Not on file  Social History Narrative   Marital status: divorced; not dating      Children: none      Lives: alone with mom      Employment: disability for mental illness in 21.  Hospitalizations x 9 in past.      Tobacco: 1 ppd x since age 79. Decreasing in 2018.      Alcohol: socially; beers.      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      Exercise: yoga daily; exercise daily.      ADLs: independent with ADLs; no car; depends on others for transportation.      Patient is right-handed. She is divorced and lives with her mother. She drinks 2-3 cups of 1/2 caffeine coffe a day. She walks occasionally for exercise.   Social Drivers of Corporate investment banker Strain: Not on file  Food Insecurity: Not on file  Transportation Needs: Not on file  Physical Activity: Not on file  Stress: Not on file  Social Connections: Unknown (02/10/2023)   Received from [facility]   Social Network    Social Network: Not on file  Intimate Partner Violence: Unknown (02/10/2023)   Received from [facility]   HITS    Physically Hurt: Not on file    Insult or Talk Down To: Not on file    Threaten Physical Harm: Not on file    Scream or Curse: Not on file    Family History  Problem Relation Age of Onset   Bipolar disorder Mother    Diabetes Mother    Hypertension Mother    Stroke Mother 60       CVA   Mental illness Mother        no diagnosis; personality disorder   Bipolar disorder Father  Hyperlipidemia Father    Hypertension Father    COPD Father    Alpha-1 antitrypsin deficiency Father    Asthma Father    Alpha-1 antitrypsin deficiency Brother     Asthma Brother    Heart disease Maternal Grandfather    Hyperlipidemia Maternal Grandfather    Hypertension Maternal Grandfather    Asthma Maternal Grandfather    Bipolar disorder Maternal Grandmother    Depression Maternal Grandmother    Diabetes Maternal Grandmother    Heart disease Maternal Grandmother    Hyperlipidemia Maternal Grandmother    Hypertension Maternal Grandmother    Mental illness Maternal Grandmother    Heart disease Paternal Grandfather    Hyperlipidemia Paternal Grandfather    Hypertension Paternal Grandfather    Stroke Paternal Grandfather    Alzheimer's disease Paternal Grandmother    Allergic rhinitis Neg Hx    Angioedema Neg Hx    Eczema Neg Hx    Urticaria Neg Hx    Immunodeficiency Neg Hx    Colon cancer Neg Hx    Esophageal cancer Neg Hx      Current Outpatient Medications:    albuterol (VENTOLIN HFA) 108 (90 Base) MCG/ACT inhaler, Inhale 2 puffs into the lungs every 4 (four) hours as needed for wheezing or shortness of breath., Disp: 18 g, Rfl: 1   clonazePAM (KLONOPIN) 0.5 MG tablet, Take 0.5 mg by mouth at bedtime., Disp: , Rfl:    clonazePAM (KLONOPIN) 1 MG tablet, Take 1 mg by mouth at bedtime., Disp: , Rfl:    EPINEPHrine 0.3 mg/0.3 mL IJ SOAJ injection, Inject 0.3 mg into the skin as needed for anaphylaxis., Disp: 1 each, Rfl: 0   ibuprofen (ADVIL) 800 MG tablet, TAKE 1 TABLET(800 MG) BY MOUTH EVERY 8 HOURS AS NEEDED, Disp: 30 tablet, Rfl: 0   tamoxifen (NOLVADEX) 20 MG tablet, TAKE 1 TABLET(20 MG) BY MOUTH EVERY EVENING FOR BREAST CANCER, Disp: 90 tablet, Rfl: 1   VRAYLAR 1.5 MG capsule, Take 1.5 mg by mouth daily., Disp: , Rfl:   PHYSICAL EXAM: ECOG FS:1 - Symptomatic but completely ambulatory    Vitals:   05/06/23 1434  BP: 108/76  Pulse: 98  Resp: 16  Temp: 98.3 F (36.8 C)  TempSrc: Temporal  SpO2: 98%  Weight: 152 lb 3.2 oz (69 kg)  Height: 5\' 7"  (1.702 m)   Physical Exam Vitals and nursing note reviewed.  Constitutional:       Appearance: She is not ill-appearing or toxic-appearing.  HENT:     Head: Normocephalic.  Eyes:     Conjunctiva/sclera: Conjunctivae normal.  Cardiovascular:     Rate and Rhythm: Normal rate.  Pulmonary:     Effort: Pulmonary effort is normal.  Abdominal:     General: There is no distension.  Musculoskeletal:        General: Normal range of motion.     Cervical back: Normal range of motion.     Left lower leg: No edema.     Comments: No deformity of left leg. Small non tender mass in popliteal fossa. No warmth or erythema.  Pain not reprodicble in left lower extremity. Compartments are soft. Antalgic gait, able to bear weight  Skin:    General: Skin is warm and dry.  Neurological:     Mental Status: She is alert.        LABORATORY DATA: I have reviewed the data as listed    Latest Ref Rng & Units 04/09/2023   12:44 AM 04/08/2023  6:48 AM 10/27/2021    1:48 PM  CBC  WBC 4.0 - 10.5 K/uL 7.0  8.1  14.2   Hemoglobin 12.0 - 15.0 g/dL 40.9  81.1  91.4   Hematocrit 36.0 - 46.0 % 40.9  45.5  40.6   Platelets 150 - 400 K/uL 247  290  234         Latest Ref Rng & Units 04/09/2023   12:44 AM 04/08/2023    6:48 AM 10/27/2021    1:48 PM  CMP  Glucose 70 - 99 mg/dL 782  956  97   BUN 6 - 20 mg/dL 19  16  17    Creatinine 0.44 - 1.00 mg/dL 2.13  0.86  5.78   Sodium 135 - 145 mmol/L 134  137  138   Potassium 3.5 - 5.1 mmol/L 3.7  4.2  3.9   Chloride 98 - 111 mmol/L 107  108  106   CO2 22 - 32 mmol/L 22  23  26    Calcium 8.9 - 10.3 mg/dL 8.3  8.8  8.7   Total Protein 6.5 - 8.1 g/dL  6.5    Total Bilirubin 0.0 - 1.2 mg/dL  0.5    Alkaline Phos 38 - 126 U/L  60    AST 15 - 41 U/L  17    ALT 0 - 44 U/L  14         RADIOGRAPHIC STUDIES (from last 24 hours if applicable) I have personally reviewed the radiological images as listed and agreed with the findings in the report. DG Knee 1-2 Views Left Result Date: 05/06/2023 CLINICAL DATA:  Pain and swelling after injury EXAM:  LEFT KNEE - 2 VIEW; LEFT TIBIA AND FIBULA - 2 VIEW COMPARISON:  None Available. FINDINGS: No evidence of fracture, dislocation, or joint effusion. No evidence of arthropathy or other focal bone abnormality. Soft tissues are unremarkable. IMPRESSION: No acute osseous abnormality Electronically Signed   By: Karen Kays M.D.   On: 05/06/2023 16:24   DG Tibia/Fibula Left Result Date: 05/06/2023 CLINICAL DATA:  Pain and swelling after injury EXAM: LEFT KNEE - 2 VIEW; LEFT TIBIA AND FIBULA - 2 VIEW COMPARISON:  None Available. FINDINGS: No evidence of fracture, dislocation, or joint effusion. No evidence of arthropathy or other focal bone abnormality. Soft tissues are unremarkable. IMPRESSION: No acute osseous abnormality Electronically Signed   By: Karen Kays M.D.   On: 05/06/2023 16:24        Visit Diagnosis: 1. Left leg swelling   2. Ductal carcinoma in situ (DCIS) of right breast      Orders Placed This Encounter  Procedures   DG Knee 1-2 Views Left    Standing Status:   Future    Number of Occurrences:   1    Expected Date:   05/06/2023    Expiration Date:   05/05/2024    Reason for Exam (SYMPTOM  OR DIAGNOSIS REQUIRED):   knee pain and swelling after an injury    Is patient pregnant?:   No    Preferred imaging location?:   [facility]   DG Tibia/Fibula Left    Standing Status:   Future    Number of Occurrences:   1    Expected Date:   05/06/2023    Expiration Date:   05/05/2024    Reason for Exam (SYMPTOM  OR DIAGNOSIS REQUIRED):   acute leg pain after injury    Is patient pregnant?:   No  Preferred imaging location?:   [facility]    All questions were answered. The patient knows to call the clinic with any problems, questions or concerns. No barriers to learning was detected.  A total of more than 30 minutes were spent on this encounter with face-to-face time and non-face-to-face time, including preparing to see the patient, ordering tests, counseling the  patient and coordination of care as outlined above.    Thank you for allowing me to participate in the care of this patient.    Shanon Ace, PA-C Department of Hematology/Oncology [facility] at [facility] Phone: [phone]  [phone]    05/07/2023 10:47 AM

## 2023-05-07 ENCOUNTER — Telehealth: Payer: Self-pay

## 2023-05-07 ENCOUNTER — Telehealth: Payer: Self-pay | Admitting: Physician Assistant

## 2023-05-07 ENCOUNTER — Encounter: Payer: Self-pay | Admitting: Physician Assistant

## 2023-05-07 NOTE — Telephone Encounter (Signed)
Attempted to contact patient on primary number regarding results from recent visit with Daphane Shepherd, PA-C and follow-up recommendations.  Patient's mailbox is full at this time.  RN will attempt to callback again later in the day.

## 2023-05-07 NOTE — Telephone Encounter (Signed)
I notified Catherine Olsen by phone regarding left knee and tibia/fibula xray results.  X-rays were negative.  Patient tells me she already followed up with orthopedics today at an orthopedic urgent care.  She also remembered that she has had an enlarged lymph node in her groin for many years thought to be related to her history of endometriosis.  She will continue to have PCP follow that.  All of patient's questions were answered and she expressed understanding of the plan provided.

## 2023-05-12 ENCOUNTER — Encounter: Payer: Self-pay | Admitting: Hematology and Oncology

## 2023-05-14 ENCOUNTER — Ambulatory Visit
Admission: RE | Admit: 2023-05-14 | Discharge: 2023-05-14 | Disposition: A | Discharge status: Home / Self Care | Payer: Medicare Other | Source: Ambulatory Visit | Attending: Adult Health | Admitting: Adult Health

## 2023-05-14 ENCOUNTER — Encounter: Payer: Self-pay | Admitting: Hematology and Oncology

## 2023-05-14 ENCOUNTER — Other Ambulatory Visit: Payer: Self-pay

## 2023-05-14 DIAGNOSIS — N6341 Unspecified lump in right breast, subareolar: Secondary | ICD-10-CM

## 2023-05-14 DIAGNOSIS — D0511 Intraductal carcinoma in situ of right breast: Secondary | ICD-10-CM

## 2023-05-18 ENCOUNTER — Inpatient Hospital Stay: Payer: Medicare Other | Admitting: Adult Health

## 2023-05-18 ENCOUNTER — Other Ambulatory Visit: Payer: Self-pay

## 2023-05-19 ENCOUNTER — Telehealth: Payer: Self-pay | Admitting: Hematology and Oncology

## 2023-05-19 NOTE — Telephone Encounter (Signed)
Contacted the patient to move her 3/20 appointment to 3/27 per 2/7 scheduling message. Patient stated she is going to try and move her scan to fit the follow up. She said she will call back if it needs to change.

## 2023-05-19 NOTE — Telephone Encounter (Signed)
Patient rescheduled MRI appointment. Appointments are confirmed to stay scheduled as is.

## 2023-05-25 ENCOUNTER — Other Ambulatory Visit: Payer: Medicare Other

## 2023-06-15 ENCOUNTER — Ambulatory Visit
Admission: RE | Admit: 2023-06-15 | Discharge: 2023-06-15 | Disposition: A | Discharge status: Home / Self Care | Payer: Medicare Other | Source: Ambulatory Visit | Attending: Adult Health | Admitting: Adult Health

## 2023-06-15 DIAGNOSIS — D0511 Intraductal carcinoma in situ of right breast: Secondary | ICD-10-CM

## 2023-06-15 MED ORDER — GADOPICLENOL 0.5 MMOL/ML IV SOLN
7.0000 mL | Freq: Once | INTRAVENOUS | Status: AC | PRN
  Administered 2023-06-15: 7 mL via INTRAVENOUS

## 2023-06-24 ENCOUNTER — Ambulatory Visit: Payer: Medicare Other | Admitting: Hematology and Oncology

## 2023-06-25 ENCOUNTER — Encounter: Payer: Self-pay | Admitting: Hematology and Oncology

## 2023-06-25 ENCOUNTER — Inpatient Hospital Stay: Payer: Medicare Other | Attending: Adult Health | Admitting: Hematology and Oncology

## 2023-06-25 DIAGNOSIS — D0511 Intraductal carcinoma in situ of right breast: Secondary | ICD-10-CM

## 2023-06-25 MED ORDER — TAMOXIFEN CITRATE 20 MG PO TABS: 20.0000 mg | ORAL_TABLET | Freq: Every day | ORAL | 3 refills | Status: DC

## 2023-06-25 NOTE — Assessment & Plan Note (Signed)
 Right lumpectomy: Dr. Ezzard Standing: Intermediate grade DCIS 0.2 cm, 0.1 cm from anterior margin, LCIS, ER 95%, PR 30% Tis NX stage 0  07/06/20: Right Lumpectomy: 0.2 cm IG DCIS Margins Neg, ER /PR Positive Treatment plan: Adjuvant radiation therapy: Patient discussed with Dr. Roselind Messier and decided against doing radiation.  ___________________________________________________________________________ Maxillofacial CT scan 11/22/2019: Normal  Dr. Lance Coon is her psychiatrist   Breast Cancer Surveillance: Mammogram 10/26/2019: Benign breast density category D Breast MRI 06/29/2020: Interval 5.6 x 3.6 x 2.9 cm area of low-grade ductal enhancement in the upper half of right breast.   Patient decided not to undergo surgery for this and is currently on tamoxifen.   Tamoxifen Toxicities:None   Hospitalization: 01/04/2021-01/10/2021: Mental health crisis  Mammogram 05/08/2021: Benign  Breast MRI 06/17/2022: Improving enhancement of the UIQ and UOQ right breast.  Residual small non-mass enhancement 1.7 x 1 cm (previously 5.6 x 2.9 cm), additional 5 to 6 mm enhancing masses in the right breast stable   Breast MRI 06/15/2023:.  Radiology recommended MRI guided biopsy of right lumpectomy site, central area of abnormal enhancement right breast

## 2023-06-25 NOTE — Progress Notes (Signed)
 HEMATOLOGY-ONCOLOGY TELEPHONE VISIT PROGRESS NOTE  I connected with our patient on 06/25/23 at  3:00 PM EDT by telephone and verified that I am speaking with the correct person using two identifiers.  I discussed the limitations, risks, security and privacy concerns of performing an evaluation and management service by telephone and the availability of in person appointments.  I also discussed with the patient that there may be a patient responsible charge related to this service. The patient expressed understanding and agreed to proceed.   History of Present Illness:   History of Present Illness Catherine Olsen is a 53 year old female who presents for routine follow-up regarding breast cancer management.  She is on tamoxifen for breast cancer management and tolerates the treatment well without significant side effects. Her imaging schedule includes a mammogram and ultrasound in February, followed by an MRI in March. She prefers to have the MRI shortly after the mammogram due to anxiety about the results.    Oncology History  Ductal carcinoma in situ (DCIS) of right breast  06/29/2019 Initial Diagnosis   Right breast biopsy: Fibrocystic changes, pseudoangiomatous stromal hyperplasia   07/21/2019 Surgery   Right lumpectomy: Dr. Ezzard Standing: Intermediate grade DCIS 0.2 cm, 0.1 cm from anterior margin, LCIS, ER 95%, PR 30% Tis NX stage 0   07/21/2019 Cancer Staging   Staging form: Breast, AJCC 8th Edition - Pathologic stage from 07/21/2019: Stage 0 (pTis (DCIS), pN0, cM0, ER+, PR+) - Signed by Loa Socks, NP on 08/03/2019   07/05/2020 -  Anti-estrogen oral therapy   10 mg of tamoxifen daily      REVIEW OF SYSTEMS:   Constitutional: Denies fevers, chills or abnormal weight loss All other systems were reviewed with the patient and are negative. Observations/Objective:     Assessment Plan:  Ductal carcinoma in situ (DCIS) of right breast Right lumpectomy: Dr. Ezzard Standing: Intermediate  grade DCIS 0.2 cm, 0.1 cm from anterior margin, LCIS, ER 95%, PR 30% Tis NX stage 0  07/06/20: Right Lumpectomy: 0.2 cm IG DCIS Margins Neg, ER /PR Positive Treatment plan: Adjuvant radiation therapy: Patient discussed with Dr. Roselind Messier and decided against doing radiation.  ___________________________________________________________________________ Maxillofacial CT scan 11/22/2019: Normal  Dr. Lance Coon is her psychiatrist   Breast Cancer Surveillance: Mammogram 10/26/2019: Benign breast density category D Breast MRI 06/29/2020: Interval 5.6 x 3.6 x 2.9 cm area of low-grade ductal enhancement in the upper half of right breast.   Patient decided not to undergo surgery for this and is currently on tamoxifen.   Tamoxifen Toxicities:None   Hospitalization: 01/04/2021-01/10/2021: Mental health crisis  Mammogram 05/08/2021: Benign  Breast MRI 06/17/2022: Improving enhancement of the UIQ and UOQ right breast.  Residual small non-mass enhancement 1.7 x 1 cm (previously 5.6 x 2.9 cm), additional 5 to 6 mm enhancing masses in the right breast stable   Breast MRI 06/15/2023:.  Radiology recommended MRI guided biopsy of right lumpectomy site, central area of abnormal enhancement right breast  --------------------------------- Assessment and Plan Assessment & Plan Ductal carcinoma in situ (DCIS) of right breast DCIS of right breast managed with tamoxifen 10 mg daily, well-tolerated. Annual imaging planned for monitoring. - Continue tamoxifen 10 mg oral daily. - Order annual MRI in March. - Schedule annual mammogram and ultrasound in February. - Send tamoxifen prescription refills to [facility] at [address] and [address].      I discussed the assessment and treatment plan with the patient. The patient was provided an opportunity to ask questions and all were  answered. The patient agreed with the plan and demonstrated an understanding of the instructions. The patient was advised to call back or seek an  in-person evaluation if the symptoms worsen or if the condition fails to improve as anticipated.   I provided 10 minutes of non-face-to-face time during this encounter.  This includes time for charting and coordination of care   Tamsen Meek, MD

## 2023-06-26 ENCOUNTER — Telehealth: Payer: Self-pay | Admitting: Hematology and Oncology

## 2023-06-26 NOTE — Telephone Encounter (Signed)
 Scheduled appointment per 3/20 los. Left VM with appointment details.

## 2023-06-30 ENCOUNTER — Other Ambulatory Visit: Payer: Medicare Other

## 2023-08-01 ENCOUNTER — Other Ambulatory Visit: Payer: Self-pay

## 2023-08-01 ENCOUNTER — Emergency Department (HOSPITAL_COMMUNITY)
Admission: EM | Admit: 2023-08-01 | Discharge: 2023-08-02 | Disposition: A | Discharge status: Home / Self Care | Attending: Emergency Medicine | Admitting: Emergency Medicine

## 2023-08-01 ENCOUNTER — Encounter (HOSPITAL_COMMUNITY): Payer: Self-pay

## 2023-08-01 DIAGNOSIS — T7840XA Allergy, unspecified, initial encounter: Secondary | ICD-10-CM | POA: Insufficient documentation

## 2023-08-01 DIAGNOSIS — Z853 Personal history of malignant neoplasm of breast: Secondary | ICD-10-CM | POA: Diagnosis not present

## 2023-08-01 DIAGNOSIS — Z9012 Acquired absence of left breast and nipple: Secondary | ICD-10-CM | POA: Insufficient documentation

## 2023-08-01 MED ORDER — DIPHENHYDRAMINE HCL 50 MG/ML IJ SOLN
25.0000 mg | Freq: Once | INTRAMUSCULAR | Status: AC
  Administered 2023-08-02: 25 mg via INTRAVENOUS
  Filled 2023-08-01: qty 1

## 2023-08-01 MED ORDER — METHYLPREDNISOLONE SODIUM SUCC 125 MG IJ SOLR
125.0000 mg | Freq: Once | INTRAMUSCULAR | Status: AC
  Administered 2023-08-02: 125 mg via INTRAVENOUS
  Filled 2023-08-01: qty 2

## 2023-08-01 NOTE — ED Triage Notes (Signed)
 Pt states that she started having mild rx with redness/itching to her forehead and then her scalp. Pt thought it was an allergic rx and took benadryl  and ibuprofen  with no relief. Last had benadryl  1 hr PTA. Pt states that the redness has now generally spread and she is starting to have swelling in her wrist bilaterally and it hurts to bend wrist.Pt endorses recently switching back to a shampoo that she used to use.

## 2023-08-01 NOTE — ED Provider Notes (Signed)
 [place] EMERGENCY DEPARTMENT AT [facility] Provider Note   CSN: [id number] Arrival date & time: 08/01/23  2259     History {Add pertinent medical, surgical, social history, OB history to HPI:1} Chief Complaint  Patient presents with   Allergic Reaction    Catherine Olsen is a 53 y.o. female.  The history is provided by the patient.  Allergic Reaction Catherine Olsen is a 53 y.o. female who presents to the Emergency Department complaining of *** Two days ago Two days of swelling to forehead, itchy Today now hands are swollen - this morning left swecond digit. Hands are itching.  No fever Left wrist is sore No cough, sob No abdominal pain, n/v  2021 lumpectomy, tamoxifen  Endometriosis Vrayler, lunestra (two weeks ago), ibuprofen      Home Medications Prior to Admission medications   Medication Sig Start Date End Date Taking? Authorizing Provider  albuterol  (VENTOLIN  HFA) 108 (90 Base) MCG/ACT inhaler Inhale 2 puffs into the lungs every 4 (four) hours as needed for wheezing or shortness of breath. 05/16/21   Vernell Goldsmith, MD  clonazePAM  (KLONOPIN ) 0.5 MG tablet Take 0.5 mg by mouth at bedtime. 08/31/21   [provider]  clonazePAM  (KLONOPIN ) 1 MG tablet Take 1 mg by mouth at bedtime. 04/04/23   [provider]  EPINEPHrine  0.3 mg/0.3 mL IJ SOAJ injection Inject 0.3 mg into the skin as needed for anaphylaxis. 05/16/21   Vernell Goldsmith, MD  ibuprofen  (ADVIL ) 800 MG tablet TAKE 1 TABLET(800 MG) BY MOUTH EVERY 8 HOURS AS NEEDED 03/20/23   Ninetta Basket, MD  tamoxifen  (NOLVADEX ) 20 MG tablet Take 1 tablet (20 mg total) by mouth daily. 06/25/23   Gudena, Vinay, MD  VRAYLAR  1.5 MG capsule Take 1.5 mg by mouth daily. 04/06/23   [provider]      Allergies    Other, Prednisone , Sulfamethoxazole, Oxycodone , Abilify  [aripiprazole ], Celebrex  [celecoxib ], Misc. sulfonamide containing compounds, Molds & smuts, Penicillins, Tylenol   [acetaminophen ], Lamictal  [lamotrigine ], Percocet [oxycodone -acetaminophen ], Sulfa antibiotics, and Sulfites    Review of Systems   Review of Systems  All other systems reviewed and are negative.   Physical Exam Updated Vital Signs Ht 5\' 7"  (1.702 m)   Wt 63.5 kg   LMP 11/21/2014   BMI 21.93 kg/m  Physical Exam Vitals and nursing note reviewed.  Constitutional:      Appearance: She is well-developed.  HENT:     Head: Normocephalic and atraumatic.     Comments: Soft tissue swelling erythema to forehead.   Cardiovascular:     Rate and Rhythm: Normal rate and regular rhythm.     Heart sounds: No murmur heard. Pulmonary:     Effort: Pulmonary effort is normal. No respiratory distress.     Breath sounds: Normal breath sounds.  Abdominal:     Palpations: Abdomen is soft.     Tenderness: There is no abdominal tenderness. There is no guarding or rebound.  Musculoskeletal:        General: No tenderness.     Comments: Redness and swelling to bilateral hands, right greater than left with ROM intact  Skin:    General: Skin is warm and dry.  Neurological:     Mental Status: She is alert and oriented to person, place, and time.  Psychiatric:        Behavior: Behavior normal.     ED Results / Procedures / Treatments   Labs (all labs ordered are listed, but only abnormal results  are displayed) Labs Reviewed - No data to display  EKG None  Radiology No results found.  Procedures Procedures  {Document cardiac monitor, telemetry assessment procedure when appropriate:1}  Medications Ordered in ED Medications - No data to display  ED Course/ Medical Decision Making/ A&P   {   Click here for ABCD2, HEART and other calculatorsREFRESH Note before signing :1}                              Medical Decision Making  ***  {Document critical care time when appropriate:1} {Document review of labs and clinical decision tools ie heart score, Chads2Vasc2 etc:1}  {Document your  independent review of radiology images, and any outside records:1} {Document your discussion with family members, caretakers, and with consultants:1} {Document social determinants of health affecting pt's care:1} {Document your decision making why or why not admission, treatments were needed:1} Final Clinical Impression(s) / ED Diagnoses Final diagnoses:  None    Rx / DC Orders ED Discharge Orders     None

## 2023-08-02 DIAGNOSIS — T7840XA Allergy, unspecified, initial encounter: Secondary | ICD-10-CM | POA: Diagnosis not present

## 2023-08-02 MED ORDER — HYDROXYZINE HCL 25 MG PO TABS: 25.0000 mg | ORAL_TABLET | Freq: Three times a day (TID) | ORAL | 0 refills | Status: DC | PRN

## 2023-08-02 MED ORDER — PREDNISONE 10 MG PO TABS: 40.0000 mg | ORAL_TABLET | Freq: Every day | ORAL | 0 refills | Status: DC

## 2023-08-04 ENCOUNTER — Ambulatory Visit: Admitting: Allergy & Immunology

## 2023-08-28 ENCOUNTER — Ambulatory Visit: Admitting: Allergy

## 2023-09-23 ENCOUNTER — Encounter (HOSPITAL_BASED_OUTPATIENT_CLINIC_OR_DEPARTMENT_OTHER): Payer: Self-pay | Admitting: Emergency Medicine

## 2023-09-23 ENCOUNTER — Emergency Department (HOSPITAL_BASED_OUTPATIENT_CLINIC_OR_DEPARTMENT_OTHER)
Admission: EM | Admit: 2023-09-23 | Discharge: 2023-09-23 | Disposition: A | Discharge status: Home / Self Care | Attending: Emergency Medicine | Admitting: Emergency Medicine

## 2023-09-23 ENCOUNTER — Other Ambulatory Visit: Payer: Self-pay

## 2023-09-23 DIAGNOSIS — R1031 Right lower quadrant pain: Secondary | ICD-10-CM | POA: Diagnosis present

## 2023-09-23 LAB — CBC
HCT: 43 % (ref 36.0–46.0)
Hemoglobin: 14.8 g/dL (ref 12.0–15.0)
MCH: 32.2 pg (ref 26.0–34.0)
MCHC: 34.4 g/dL (ref 30.0–36.0)
MCV: 93.5 fL (ref 80.0–100.0)
Platelets: 224 10*3/uL (ref 150–400)
RBC: 4.6 MIL/uL (ref 3.87–5.11)
RDW: 13.8 % (ref 11.5–15.5)
WBC: 8.4 10*3/uL (ref 4.0–10.5)
nRBC: 0 % (ref 0.0–0.2)

## 2023-09-23 LAB — COMPREHENSIVE METABOLIC PANEL WITH GFR
ALT: 8 U/L (ref 0–44)
AST: 17 U/L (ref 15–41)
Albumin: 4.2 g/dL (ref 3.5–5.0)
Alkaline Phosphatase: 59 U/L (ref 38–126)
Anion gap: 14 (ref 5–15)
BUN: 10 mg/dL (ref 6–20)
CO2: 18 mmol/L — ABNORMAL LOW (ref 22–32)
Calcium: 8.9 mg/dL (ref 8.9–10.3)
Chloride: 107 mmol/L (ref 98–111)
Creatinine, Ser: 0.73 mg/dL (ref 0.44–1.00)
GFR, Estimated: >60 mL/min (ref >60)
Glucose, Bld: 92 mg/dL (ref 70–99)
Potassium: 4 mmol/L (ref 3.5–5.1)
Sodium: 139 mmol/L (ref 135–145)
Total Bilirubin: 0.3 mg/dL (ref 0.0–1.2)
Total Protein: 6.7 g/dL (ref 6.5–8.1)

## 2023-09-23 LAB — LIPASE, BLOOD: Lipase: 29 U/L (ref 11–51)

## 2023-09-23 NOTE — ED Notes (Signed)
Pt currently unable to provide urine sample. 

## 2023-09-23 NOTE — Discharge Instructions (Signed)
 If at any point your abdominal pain gets worse or if your abdominal pain does not go away over the next 24 hours, you develop vomiting, fever, or any other new/concerning symptoms and return to the ER.

## 2023-09-23 NOTE — ED Triage Notes (Signed)
 Pt via pov from home with RLQ pain today. Pt has hx of ovarian endometriosis; has had surgery for it. Pt concerned for recurrence of that or of appendicitis. Pt states her endometriosis pain was always accompanied by nausea; states no nausea or vomiting. Pt alert & oriented, nad noted.

## 2023-09-23 NOTE — ED Provider Notes (Signed)
 [place] EMERGENCY DEPARTMENT AT [facility] Provider Note   CSN: [id number] Arrival date & time: 09/23/23  5621     Patient presents with: Abdominal Pain   Catherine Olsen is a 53 y.o. female.   HPI 53 year old female presents with right lower quadrant abdominal pain.  Symptoms started a few hours ago.  They were severe when they first started and are steadily improving and right now it is pretty minimal.  No nausea or vomiting or diarrhea.  No urinary symptoms.  This is similar to when she has had issues with ovarian endometriosis.  She previously has had to have surgery for this.  No fevers.  Prior to Admission medications   Medication Sig Start Date End Date Taking? Authorizing Provider  albuterol  (VENTOLIN  HFA) 108 (90 Base) MCG/ACT inhaler Inhale 2 puffs into the lungs every 4 (four) hours as needed for wheezing or shortness of breath. 05/16/21   Vernell Goldsmith, MD  clonazePAM  (KLONOPIN ) 0.5 MG tablet Take 0.5 mg by mouth at bedtime. 08/31/21   [provider]  clonazePAM  (KLONOPIN ) 1 MG tablet Take 1 mg by mouth at bedtime. 04/04/23   [provider]  EPINEPHrine  0.3 mg/0.3 mL IJ SOAJ injection Inject 0.3 mg into the skin as needed for anaphylaxis. 05/16/21   Vernell Goldsmith, MD  hydrOXYzine  (ATARAX ) 25 MG tablet Take 1 tablet (25 mg total) by mouth every 8 (eight) hours as needed for itching. 08/02/23   Kelsey Patricia, MD  ibuprofen  (ADVIL ) 800 MG tablet TAKE 1 TABLET(800 MG) BY MOUTH EVERY 8 HOURS AS NEEDED 03/20/23   Ninetta Basket, MD  predniSONE  (DELTASONE ) 10 MG tablet Take 4 tablets (40 mg total) by mouth daily. 08/02/23   Kelsey Patricia, MD  tamoxifen  (NOLVADEX ) 20 MG tablet Take 1 tablet (20 mg total) by mouth daily. 06/25/23   Gudena, Vinay, MD  VRAYLAR  1.5 MG capsule Take 1.5 mg by mouth daily. 04/06/23   [provider]    Allergies: Other, Prednisone , Sulfamethoxazole, Oxycodone , Abilify  [aripiprazole ], Celebrex  [celecoxib ], Misc.  sulfonamide containing compounds, Molds & smuts, Penicillins, Tylenol  [acetaminophen ], Lamictal  [lamotrigine ], Percocet [oxycodone -acetaminophen ], Sulfa antibiotics, and Sulfites    Review of Systems  Gastrointestinal:  Positive for abdominal pain. Negative for diarrhea, nausea and vomiting.  Genitourinary:  Negative for dysuria.    Updated Vital Signs BP 118/73   Pulse 82   Temp 98.5 F (36.9 C) (Oral)   Resp 16   Ht 5' 7 (1.702 m)   Wt 63.5 kg   LMP 11/21/2014   SpO2 95%   BMI 21.93 kg/m   Physical Exam Vitals and nursing note reviewed.  Constitutional:      Appearance: She is well-developed.  HENT:     Head: Normocephalic and atraumatic.   Cardiovascular:     Rate and Rhythm: Normal rate and regular rhythm.     Heart sounds: Normal heart sounds.  Pulmonary:     Effort: Pulmonary effort is normal.     Breath sounds: Normal breath sounds.  Abdominal:     Palpations: Abdomen is soft.     Tenderness: There is abdominal tenderness in the right lower quadrant.   Skin:    General: Skin is warm and dry.   Neurological:     Mental Status: She is alert.     (all labs ordered are listed, but only abnormal results are displayed) Labs Reviewed  COMPREHENSIVE METABOLIC PANEL WITH GFR - Abnormal; Notable for the following components:  Result Value   CO2 18 (*)    All other components within normal limits  LIPASE, BLOOD  CBC  URINALYSIS, ROUTINE W REFLEX MICROSCOPIC    EKG: None  Radiology: No results found.   Procedures   Medications Ordered in the ED - No data to display                                  Medical Decision Making Amount and/or Complexity of Data Reviewed External Data Reviewed: notes. Labs: ordered.    Details: Normal WBC   Patient presents with right lower quadrant abdominal pain.  This is a recurrent issue for her that she associates with her endometriosis.  Patient preferred to hold off on imaging at this time as her pain is  already getting better by the time I saw her.  Thus we observed her for a little bit and waited for her labs to come back.  She has a normal WBC.  She has a mildly low bicarbonate but a normal anion gap.  No urinary symptoms.  At this point the pain is much better without treatment and essentially almost resolved.  She would like to hold off on CT.  I discussed that I think this is a prudent action though cannot fully rule out appendicitis or torsion without imaging.  She understands this and understands the possible downstream effects of missing these diagnoses but still would like to go home.  I think this is reasonable and discussed that if her symptoms worsen at all she should come back.  She understands this and was given return precautions.     Final diagnoses:  Right lower quadrant abdominal pain    ED Discharge Orders     None          Jerilynn Montenegro, MD 09/23/23 2135

## 2023-11-25 ENCOUNTER — Encounter: Payer: Self-pay | Admitting: Hematology and Oncology

## 2023-11-26 ENCOUNTER — Encounter: Payer: Self-pay | Admitting: Adult Health

## 2023-11-26 ENCOUNTER — Inpatient Hospital Stay: Attending: Adult Health | Admitting: Adult Health

## 2023-11-26 VITALS — BP 113/76 | HR 87 | Temp 97.6°F | Resp 18 | Ht 67.0 in | Wt 146.2 lb

## 2023-11-26 DIAGNOSIS — N63 Unspecified lump in unspecified breast: Secondary | ICD-10-CM

## 2023-11-26 DIAGNOSIS — N6321 Unspecified lump in the left breast, upper outer quadrant: Secondary | ICD-10-CM | POA: Insufficient documentation

## 2023-11-26 DIAGNOSIS — Z79810 Long term (current) use of selective estrogen receptor modulators (SERMs): Secondary | ICD-10-CM | POA: Diagnosis not present

## 2023-11-26 DIAGNOSIS — N6320 Unspecified lump in the left breast, unspecified quadrant: Secondary | ICD-10-CM | POA: Diagnosis not present

## 2023-11-26 DIAGNOSIS — D0511 Intraductal carcinoma in situ of right breast: Secondary | ICD-10-CM | POA: Insufficient documentation

## 2023-11-26 NOTE — Progress Notes (Unsigned)
 [facility] Cancer Follow up:    Pcp, No No address on file   DIAGNOSIS: Cancer Staging  Ductal carcinoma in situ (DCIS) of right breast Staging form: Breast, AJCC 8th Edition - Pathologic stage from 07/21/2019: Stage 0 (pTis (DCIS), pN0, cM0, ER+, PR+) - Signed by Crawford Morna Pickle, NP on 08/03/2019 Stage prefix: Initial diagnosis    SUMMARY OF ONCOLOGIC HISTORY: Oncology History  Ductal carcinoma in situ (DCIS) of right breast  06/29/2019 Initial Diagnosis   Right breast biopsy: Fibrocystic changes, pseudoangiomatous stromal hyperplasia   07/21/2019 Surgery   Right lumpectomy: Dr. Ethyl: Intermediate grade DCIS 0.2 cm, 0.1 cm from anterior margin, LCIS, ER 95%, PR 30% Tis NX stage 0   07/21/2019 Cancer Staging   Staging form: Breast, AJCC 8th Edition - Pathologic stage from 07/21/2019: Stage 0 (pTis (DCIS), pN0, cM0, ER+, PR+) - Signed by Crawford Morna Pickle, NP on 08/03/2019   07/05/2020 -  Anti-estrogen oral therapy   10 mg of tamoxifen  daily      CURRENT THERAPY:  INTERVAL HISTORY:  Discussed the use of AI scribe software for clinical note transcription with the patient, who gave verbal consent to proceed.  Catherine Olsen 53 y.o. female returns for    Patient Active Problem List   Diagnosis Date Noted   Therapeutic drug monitoring 05/16/2021   Atherosclerosis 05/16/2021   Anorexia 05/16/2021   Weight loss 05/16/2021   Bipolar affective disorder, mixed, severe, with psychotic behavior (HCC) 01/05/2021   Cervical spondylosis 10/16/2020   Bipolar 1 disorder, depressed, severe (HCC) 08/12/2020   Breast cancer (HCC)    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   Cocaine abuse (HCC) 09/08/2018   History of suicidal ideation 09/08/2018   Other allergic rhinitis 02/02/2018   Allergy, unspecified, subsequent encounter 02/02/2018   Mild intermittent asthma without complication 02/02/2018   Angio-edema 02/02/2018   Dermographia  02/02/2018   Gastroesophageal reflux disease without esophagitis 02/02/2018   Chronic dental pain 01/20/2018   Chronic facial pain 01/20/2018   Environmental allergies 10/29/2017   Pain in joint of left shoulder 07/15/2017   Neck pain 07/15/2017   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   Chronic migraine without aura without status migrainosus, not intractable 03/24/2017   Chronic midline low back pain without sciatica 03/24/2017   Pulmonary emphysema (HCC) 02/19/2017   Jaw pain 02/11/2017   Tobacco use disorder 12/09/2016   Exertional dyspnea 12/09/2016   Family history of alpha 1 antitrypsin deficiency 09/04/2016   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   Nodule of left lung 09/04/2016   Cervical lymphadenopathy 01/29/2016   Perimenopausal vasomotor symptoms 05/25/2015   Chronic rhinitis 04/18/2015   Pituitary microadenoma (HCC) 05/19/2014   Endometriosis of ovary 04/03/2014   Ovarian cyst, right 04/03/2014   Posttraumatic stress disorder 09/13/2012   Fibrocystic breast disease 08/24/2012   Myalgia and myositis 07/26/2012   Depression 07/26/2012   PTSD (post-traumatic stress disorder) 01/09/2009   Insomnia 01/17/2008   NEVI, MULTIPLE 07/17/2006   KERATOSIS, SEBORRHEIC NEC 07/17/2006   ACNE NEC 07/17/2006    is allergic to other, prednisone , sulfamethoxazole, oxycodone , abilify  [aripiprazole ], celebrex  [celecoxib ], misc. sulfonamide containing compounds, molds & smuts, penicillins, tylenol  [acetaminophen ], lamictal  [lamotrigine ], percocet [oxycodone -acetaminophen ], sulfa antibiotics, and sulfites.  MEDICAL HISTORY: Past Medical History:  Diagnosis Date   Abnormal uterine bleeding (AUB) 06/25/2009   Qualifier: Diagnosis of  By: Twanna MD, Ankit     Allergy    Angio-edema    Anxiety    Arthritis  hands, lower back, knees   Asthma    Bipolar 1 disorder (HCC)    Breast cancer (HCC)    stage 0   Chronic prescription benzodiazepine use 09/08/2018   COPD (chronic  obstructive pulmonary disease) (HCC)    Depression    Endometriosis    Fibromyalgia    GERD (gastroesophageal reflux disease)    Headache(784.0)    otc med prn   Migraine    Pituitary tumor    microadenoma   PONV (postoperative nausea and vomiting)    PTSD (post-traumatic stress disorder)    Scoliosis    Suicidal ideation 01/05/2021   Termination of pregnancy    x 2 at age 73 and 53 yrs old   Tobacco abuse 03/04/2015   Urticaria     SURGICAL HISTORY: Past Surgical History:  Procedure Laterality Date   BREAST BIOPSY Right 05/19/2007   BREAST BIOPSY Right 06/02/2007   BREAST BIOPSY  06/29/2019   BREAST EXCISIONAL BIOPSY     BREAST LUMPECTOMY Right 07/21/2019   BREAST LUMPECTOMY WITH RADIOACTIVE SEED LOCALIZATION Right 07/21/2019   Procedure: RIGHT BREAST LUMPECTOMY WITH RADIOACTIVE SEED LOCALIZATION;  Surgeon: Ethyl Lenis, MD;  Location: [facility];  Service: General;  Laterality: Right;   BREAST SURGERY     DILATION AND CURETTAGE OF UTERUS     FACIAL COSMETIC SURGERY     right cheek   LAPAROSCOPY Right 11/11/2013   Procedure: LAPAROSCOPY OPERATIVE with  Drainage of  RIGHT Ovarian ENDOMETRIOMA;  Surgeon: Robbi JONELLE Render, MD;  Location: [facility];  Service: Gynecology;  Laterality: Right;   NASAL ENDOSCOPY     said it showed some acid reflux from ENT   NOSE SURGERY     rhinoplasty at age 60 yrs   WISDOM TOOTH EXTRACTION      SOCIAL HISTORY: Social History   Socioeconomic History   Marital status: Divorced    Spouse name: Not on file   Number of children: 0   Years of education: Not on file   Highest education level: Bachelor's degree (e.g., BA, AB, BS)  Occupational History   Occupation: disability    Comment: mental illness  Tobacco Use   Smoking status: Every Day    Types: Cigars   Smokeless tobacco: Never  Vaping Use   Vaping status: Former  Substance and Sexual Activity   Alcohol use: Not Currently    Alcohol/week: 2.0 standard drinks of  alcohol    Types: 2 Cans of beer per week    Comment: 6 nights   Drug use: Not Currently    Types: Crack cocaine    Comment: 07/19/2019   Sexual activity: Not Currently    Comment: abortion at 49 and 81yrs. of age   Other Topics Concern   Not on file  Social History Narrative   Marital status: divorced; not dating      Children: none      Lives: alone with mom      Employment: disability for mental illness in 70.  Hospitalizations x 9 in past.      Tobacco: 1 ppd x since age 54. Decreasing in 2018.      Alcohol: socially; beers.      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      Exercise: yoga daily; exercise daily.      ADLs: independent with ADLs; no car; depends on others for transportation.      Patient is right-handed. She is divorced and lives  with her mother. She drinks 2-3 cups of 1/2 caffeine coffe a day. She walks occasionally for exercise.   Social Drivers of Corporate investment banker Strain: Not on file  Food Insecurity: Low Risk  (06/09/2023)   Received from [facility]   Hunger Vital Sign    Within the past 12 months, you worried that your food would run out before you got money to buy more: Never true    Within the past 12 months, the food you bought just didn't last and you didn't have money to get more. : Never true  Transportation Needs: No Transportation Needs (06/09/2023)   Received from [facility]    In the past 12 months, has lack of reliable transportation kept you from medical appointments, meetings, work or from getting things needed for daily living? : No  Physical Activity: Not on file  Stress: Not on file  Social Connections: Unknown (02/10/2023)   Received from [facility]   Social Network    Social Network: Not on file  Intimate Partner Violence: Unknown (02/10/2023)   Received from [facility]   HITS    Physically Hurt: Not on file    Insult or Talk Down To: Not on file    Threaten Physical Harm:  Not on file    Scream or Curse: Not on file    FAMILY HISTORY: Family History  Problem Relation Age of Onset   Bipolar disorder Mother    Diabetes Mother    Hypertension Mother    Stroke Mother 83       CVA   Mental illness Mother        no diagnosis; personality disorder   Bipolar disorder Father    Hyperlipidemia Father    Hypertension Father    COPD Father    Alpha-1 antitrypsin deficiency Father    Asthma Father    Alpha-1 antitrypsin deficiency Brother    Asthma Brother    Heart disease Maternal Grandfather    Hyperlipidemia Maternal Grandfather    Hypertension Maternal Grandfather    Asthma Maternal Grandfather    Bipolar disorder Maternal Grandmother    Depression Maternal Grandmother    Diabetes Maternal Grandmother    Heart disease Maternal Grandmother    Hyperlipidemia Maternal Grandmother    Hypertension Maternal Grandmother    Mental illness Maternal Grandmother    Heart disease Paternal Grandfather    Hyperlipidemia Paternal Grandfather    Hypertension Paternal Grandfather    Stroke Paternal Grandfather    Alzheimer's disease Paternal Grandmother    Allergic rhinitis Neg Hx    Angioedema Neg Hx    Eczema Neg Hx    Urticaria Neg Hx    Immunodeficiency Neg Hx    Colon cancer Neg Hx    Esophageal cancer Neg Hx     Review of Systems  Constitutional:  Negative for appetite change, chills, fatigue, fever and unexpected weight change.  HENT:   Negative for hearing loss, lump/mass and trouble swallowing.   Eyes:  Negative for eye problems and icterus.  Respiratory:  Negative for chest tightness, cough and shortness of breath.   Cardiovascular:  Negative for chest pain, leg swelling and palpitations.  Gastrointestinal:  Negative for abdominal distention, abdominal pain, constipation, diarrhea, nausea and vomiting.  Endocrine: Negative for hot flashes.  Genitourinary:  Negative for difficulty urinating.   Musculoskeletal:  Negative for arthralgias.  Skin:   Negative for itching and rash.  Neurological:  Negative for dizziness, extremity weakness,  headaches and numbness.  Hematological:  Negative for adenopathy. Does not bruise/bleed easily.  Psychiatric/Behavioral:  Negative for depression. The patient is not nervous/anxious.       PHYSICAL EXAMINATION    There were no vitals filed for this visit.  Physical Exam Constitutional:      General: She is not in acute distress.    Appearance: Normal appearance. She is not toxic-appearing.  HENT:     Head: Normocephalic and atraumatic.  Eyes:     General: No scleral icterus. Musculoskeletal:        General: No swelling.  Skin:    General: Skin is warm and dry.     Findings: No rash.  Neurological:     General: No focal deficit present.     Mental Status: She is alert.  Psychiatric:        Mood and Affect: Mood normal.        Behavior: Behavior normal.       ASSESSMENT and THERAPY PLAN:   No problem-specific Assessment & Plan notes found for this encounter.     All questions were answered. The patient knows to call the clinic with any problems, questions or concerns. We can certainly see the patient much sooner if necessary.  Total encounter time:*** minutes*in face-to-face visit time, chart review, lab review, care coordination, order entry, and documentation of the encounter time.    Morna Kendall, NP 11/26/23 1:18 PM Medical Oncology and Hematology [facility] [address] [place], [place] [address] Tel. [phone]    Fax. [phone]  *Total Encounter Time as defined by the Centers for Medicare and Medicaid Services includes, in addition to the face-to-face time of a patient visit (documented in the note above) non-face-to-face time: obtaining and reviewing outside history, ordering and reviewing medications, tests or procedures, care coordination (communications with other health care professionals or caregivers) and documentation in the medical  record.

## 2023-12-01 ENCOUNTER — Ambulatory Visit
Admission: RE | Admit: 2023-12-01 | Discharge: 2023-12-01 | Disposition: A | Discharge status: Home / Self Care | Source: Ambulatory Visit | Attending: Adult Health | Admitting: Adult Health

## 2023-12-01 DIAGNOSIS — D0511 Intraductal carcinoma in situ of right breast: Secondary | ICD-10-CM

## 2023-12-01 DIAGNOSIS — N63 Unspecified lump in unspecified breast: Secondary | ICD-10-CM

## 2023-12-07 ENCOUNTER — Encounter: Payer: Self-pay | Admitting: Adult Health

## 2023-12-09 ENCOUNTER — Inpatient Hospital Stay: Attending: Hematology and Oncology | Admitting: Hematology and Oncology

## 2023-12-09 ENCOUNTER — Encounter: Payer: Self-pay | Admitting: Hematology and Oncology

## 2023-12-09 ENCOUNTER — Other Ambulatory Visit: Payer: Self-pay | Admitting: *Deleted

## 2023-12-09 VITALS — BP 111/82 | HR 96 | Temp 99.1°F | Resp 17 | Wt 143.1 lb

## 2023-12-09 DIAGNOSIS — R5383 Other fatigue: Secondary | ICD-10-CM | POA: Diagnosis not present

## 2023-12-09 DIAGNOSIS — N63 Unspecified lump in unspecified breast: Secondary | ICD-10-CM

## 2023-12-09 DIAGNOSIS — M545 Low back pain, unspecified: Secondary | ICD-10-CM | POA: Insufficient documentation

## 2023-12-09 DIAGNOSIS — N6321 Unspecified lump in the left breast, upper outer quadrant: Secondary | ICD-10-CM | POA: Insufficient documentation

## 2023-12-09 DIAGNOSIS — N6310 Unspecified lump in the right breast, unspecified quadrant: Secondary | ICD-10-CM

## 2023-12-09 DIAGNOSIS — D0511 Intraductal carcinoma in situ of right breast: Secondary | ICD-10-CM | POA: Insufficient documentation

## 2023-12-09 DIAGNOSIS — N6320 Unspecified lump in the left breast, unspecified quadrant: Secondary | ICD-10-CM | POA: Insufficient documentation

## 2023-12-09 DIAGNOSIS — R222 Localized swelling, mass and lump, trunk: Secondary | ICD-10-CM | POA: Diagnosis not present

## 2023-12-09 DIAGNOSIS — Z79810 Long term (current) use of selective estrogen receptor modulators (SERMs): Secondary | ICD-10-CM | POA: Diagnosis not present

## 2023-12-09 NOTE — Assessment & Plan Note (Signed)
 Right lumpectomy: Dr. Ethyl: Intermediate grade DCIS 0.2 cm, 0.1 cm from anterior margin, LCIS, ER 95%, PR 30% Tis NX stage 0  07/06/20: Right Lumpectomy: 0.2 cm IG DCIS Margins Neg, ER /PR Positive Treatment plan: Adjuvant radiation therapy: Patient discussed with Dr. Shannon and decided against doing radiation.  ___________________________________________________________________________ Maxillofacial CT scan 11/22/2019: Normal  Dr. Darden is her psychiatrist   Breast Cancer Surveillance: Mammogram 10/26/2019: Benign breast density category D Breast MRI 06/29/2020: Interval 5.6 x 3.6 x 2.9 cm area of low-grade ductal enhancement in the upper half of right breast.   Patient decided not to undergo surgery for this and is currently on tamoxifen .   Tamoxifen  Toxicities:None   Hospitalization: 01/04/2021-01/10/2021: Mental health crisis  Mammogram 05/08/2021: Benign  Breast MRI 06/17/2022: Improving enhancement of the UIQ and UOQ right breast.  Residual small non-mass enhancement 1.7 x 1 cm (previously 5.6 x 2.9 cm), additional 5 to 6 mm enhancing masses in the right breast stable   Breast MRI 06/15/2023:.  Radiology recommended MRI guided biopsy of right lumpectomy site, central area of abnormal enhancement right breast  Mammogram and ultrasound 12/01/2023: Indeterminate 1 cm mass at 1130 region right breast, indeterminate complex cystic mass right breast at 11:30 position, stable 8 mm cluster of cysts (patient refused biopsies)  Radiology plans for short interval follow-up

## 2023-12-09 NOTE — Progress Notes (Signed)
 Patient Care Team: Pcp, No as PCP - General Odean Potts, MD as PCP - Hematology/Oncology (Hematology and Oncology) Services, [facility] as Referring Physician Drury Ned, MD as Referring Physician (Gynecology) Carlie Clark, MD as Consulting Physician (Otolaryngology) Rosine Yancy ORN as Consulting Physician (Neurosurgery)  DIAGNOSIS:  Encounter Diagnosis  Name Primary?   Ductal carcinoma in situ (DCIS) of right breast Yes    SUMMARY OF ONCOLOGIC HISTORY: Oncology History  Ductal carcinoma in situ (DCIS) of right breast  06/29/2019 Initial Diagnosis   Right breast biopsy: Fibrocystic changes, pseudoangiomatous stromal hyperplasia   07/21/2019 Surgery   Right lumpectomy: Dr. Ethyl: Intermediate grade DCIS 0.2 cm, 0.1 cm from anterior margin, LCIS, ER 95%, PR 30% Tis NX stage 0   07/21/2019 Cancer Staging   Staging form: Breast, AJCC 8th Edition - Pathologic stage from 07/21/2019: Stage 0 (pTis (DCIS), pN0, cM0, ER+, PR+) - Signed by Crawford Morna Pickle, NP on 08/03/2019   07/05/2020 -  Anti-estrogen oral therapy   10 mg of tamoxifen  daily      CHIEF COMPLIANT: Follow-up to discuss results of recent ultrasound  HISTORY OF PRESENT ILLNESS:  History of Present Illness Catherine Olsen is a 53 year old female with breast cancer who presents with concerns about a new breast lump and back pain.  She has a new one-centimeter breast lump, absent in March, raising concerns about recurrence due to her breast cancer history. Previous MRIs showed benign findings, but the rapid growth of this lump is concerning.  She experiences severe lower back pain, rated eight out of ten, unrelieved by rest, contributing to her fatigue. A palpable nodule in the right axilla is present, and she is uncertain of its nature, raising concerns about its relation to her breast cancer history.  She has taken tamoxifen  and is concerned about potential side effects, including bone density loss,  though she has not undergone a bone density test. She is also worried about Latuda's impact on her breast health, noting breast enlargement and cysts. She prefers oral medications to manage her condition and is focused on maintaining her quality of life.     ALLERGIES:  is allergic to other, prednisone , sulfamethoxazole, oxycodone , abilify  [aripiprazole ], celebrex  [celecoxib ], misc. sulfonamide containing compounds, molds & smuts, penicillins, tylenol  [acetaminophen ], lamictal  [lamotrigine ], percocet [oxycodone -acetaminophen ], sulfa antibiotics, and sulfites.  MEDICATIONS:  Current Outpatient Medications  Medication Sig Dispense Refill   albuterol  (VENTOLIN  HFA) 108 (90 Base) MCG/ACT inhaler Inhale 2 puffs into the lungs every 4 (four) hours as needed for wheezing or shortness of breath. 18 g 1   clonazePAM  (KLONOPIN ) 0.5 MG tablet Take 0.5 mg by mouth at bedtime.     clonazePAM  (KLONOPIN ) 1 MG tablet Take 1 mg by mouth at bedtime.     EPINEPHrine  0.3 mg/0.3 mL IJ SOAJ injection Inject 0.3 mg into the skin as needed for anaphylaxis. 1 each 0   hydrOXYzine  (ATARAX ) 25 MG tablet Take 1 tablet (25 mg total) by mouth every 8 (eight) hours as needed for itching. 12 tablet 0   ibuprofen  (ADVIL ) 800 MG tablet TAKE 1 TABLET(800 MG) BY MOUTH EVERY 8 HOURS AS NEEDED 30 tablet 0   predniSONE  (DELTASONE ) 10 MG tablet Take 4 tablets (40 mg total) by mouth daily. 20 tablet 0   tamoxifen  (NOLVADEX ) 20 MG tablet Take 1 tablet (20 mg total) by mouth daily. 90 tablet 3   VRAYLAR  1.5 MG capsule Take 1.5 mg by mouth daily.     No current facility-administered medications  for this visit.    PHYSICAL EXAMINATION: ECOG PERFORMANCE STATUS: 1 - Symptomatic but completely ambulatory  Vitals:   12/09/23 1505  BP: 111/82  Pulse: 96  Resp: 17  Temp: 99.1 F (37.3 C)  SpO2: 97%   Filed Weights   12/09/23 1505  Weight: 143 lb 1.6 oz (64.9 kg)    Physical Exam BREAST: Nodule in right axilla  (exam performed  in the presence of a chaperone)  LABORATORY DATA:  I have reviewed the data as listed    Latest Ref Rng & Units 09/23/2023    6:26 PM 04/09/2023   12:44 AM 04/08/2023    6:48 AM  CMP  Glucose 70 - 99 mg/dL 92  886  892   BUN 6 - 20 mg/dL 10  19  16    Creatinine 0.44 - 1.00 mg/dL 9.26  9.32  9.21   Sodium 135 - 145 mmol/L 139  134  137   Potassium 3.5 - 5.1 mmol/L 4.0  3.7  4.2   Chloride 98 - 111 mmol/L 107  107  108   CO2 22 - 32 mmol/L 18  22  23    Calcium  8.9 - 10.3 mg/dL 8.9  8.3  8.8   Total Protein 6.5 - 8.1 g/dL 6.7   6.5   Total Bilirubin 0.0 - 1.2 mg/dL 0.3   0.5   Alkaline Phos 38 - 126 U/L 59   60   AST 15 - 41 U/L 17   17   ALT 0 - 44 U/L 8   14     Lab Results  Component Value Date   WBC 8.4 09/23/2023   HGB 14.8 09/23/2023   HCT 43.0 09/23/2023   MCV 93.5 09/23/2023   PLT 224 09/23/2023   NEUTROABS 11.4 (H) 10/27/2021    ASSESSMENT & PLAN:  Ductal carcinoma in situ (DCIS) of right breast Right lumpectomy: Dr. Ethyl: Intermediate grade DCIS 0.2 cm, 0.1 cm from anterior margin, LCIS, ER 95%, PR 30% Tis NX stage 0  07/06/20: Right Lumpectomy: 0.2 cm IG DCIS Margins Neg, ER /PR Positive Treatment plan: Adjuvant radiation therapy: Patient discussed with Dr. Shannon and decided against doing radiation.  ___________________________________________________________________________ Maxillofacial CT scan 11/22/2019: Normal  Dr. Darden is her psychiatrist   Breast Cancer Surveillance: Mammogram 10/26/2019: Benign breast density category D Breast MRI 06/29/2020: Interval 5.6 x 3.6 x 2.9 cm area of low-grade ductal enhancement in the upper half of right breast.   Patient decided not to undergo surgery for this and is currently on tamoxifen .   Tamoxifen  Toxicities:None   Hospitalization: 01/04/2021-01/10/2021: Mental health crisis  Mammogram 05/08/2021: Benign  Breast MRI 06/17/2022: Improving enhancement of the UIQ and UOQ right breast.  Residual small non-mass enhancement 1.7  x 1 cm (previously 5.6 x 2.9 cm), additional 5 to 6 mm enhancing masses in the right breast stable   Breast MRI 06/15/2023:.  Radiology recommended MRI guided biopsy of right lumpectomy site, central area of abnormal enhancement right breast  Mammogram and ultrasound 12/01/2023: Indeterminate 1 cm mass at 1130 region right breast, indeterminate complex cystic mass right breast at 11:30 position, stable 8 mm cluster of cysts (patient refused biopsies)  Plan: Ultrasound-guided biopsies of the breast masses Ultrasound of the right axilla for evaluation of the superficial palpable nodule (it could be a sebaceous cyst)  Follow-up 1 week after the biopsy to discuss pathology report. The reason why she agreed to biopsy this was that I discussed with her that there is  a possibility of using aromatase inhibitor since above tamoxifen  pathology is ER positive and these findings in the breast are truly a progression of her disease.   No orders of the defined types were placed in this encounter.  The patient has a good understanding of the overall plan. she agrees with it. she will call with any problems that may develop before the next visit here. Total time spent: 30 mins including face to face time and time spent for planning, charting and co-ordination of care   Catherine K Cartina Brousseau, MD 12/09/23

## 2023-12-14 ENCOUNTER — Encounter: Payer: Self-pay | Admitting: Hematology and Oncology

## 2023-12-15 ENCOUNTER — Ambulatory Visit
Admission: RE | Admit: 2023-12-15 | Discharge: 2023-12-15 | Disposition: A | Discharge status: Home / Self Care | Source: Ambulatory Visit | Attending: Hematology and Oncology | Admitting: Hematology and Oncology

## 2023-12-15 ENCOUNTER — Encounter: Payer: Self-pay | Admitting: Hematology and Oncology

## 2023-12-15 ENCOUNTER — Other Ambulatory Visit: Payer: Self-pay | Admitting: Hematology and Oncology

## 2023-12-15 DIAGNOSIS — N63 Unspecified lump in unspecified breast: Secondary | ICD-10-CM

## 2023-12-15 DIAGNOSIS — D0511 Intraductal carcinoma in situ of right breast: Secondary | ICD-10-CM

## 2023-12-15 DIAGNOSIS — N6310 Unspecified lump in the right breast, unspecified quadrant: Secondary | ICD-10-CM

## 2023-12-15 HISTORY — PX: BREAST BIOPSY: SHX20

## 2023-12-17 LAB — SURGICAL PATHOLOGY

## 2023-12-22 ENCOUNTER — Inpatient Hospital Stay (HOSPITAL_BASED_OUTPATIENT_CLINIC_OR_DEPARTMENT_OTHER): Admitting: Hematology and Oncology

## 2023-12-22 VITALS — BP 100/76 | HR 79 | Temp 98.1°F | Resp 18 | Ht 67.0 in | Wt 141.3 lb

## 2023-12-22 DIAGNOSIS — D0511 Intraductal carcinoma in situ of right breast: Secondary | ICD-10-CM | POA: Diagnosis not present

## 2023-12-22 MED ORDER — TAMOXIFEN CITRATE 20 MG PO TABS: 20.0000 mg | ORAL_TABLET | Freq: Every day | ORAL | 3 refills | Status: AC

## 2023-12-22 NOTE — Assessment & Plan Note (Signed)
 Right lumpectomy: Dr. Ethyl: Intermediate grade DCIS 0.2 cm, 0.1 cm from anterior margin, LCIS, ER 95%, PR 30% Tis NX stage 0  07/06/20: Right Lumpectomy: 0.2 cm IG DCIS Margins Neg, ER /PR Positive Treatment plan: Adjuvant radiation therapy: Patient discussed with Dr. Shannon and decided against doing radiation.  ___________________________________________________________________________ Maxillofacial CT scan 11/22/2019: Normal  Dr. Darden is her psychiatrist   Breast Cancer Surveillance: Mammogram 10/26/2019: Benign breast density category D Breast MRI 06/29/2020: Interval 5.6 x 3.6 x 2.9 cm area of low-grade ductal enhancement in the upper half of right breast.   Patient decided not to undergo surgery for this and is currently on tamoxifen .   Tamoxifen  Toxicities:None   Hospitalization: 01/04/2021-01/10/2021: Mental health crisis  Mammogram 05/08/2021: Benign  Breast MRI 06/17/2022: Improving enhancement of the UIQ and UOQ right breast.  Residual small non-mass enhancement 1.7 x 1 cm (previously 5.6 x 2.9 cm), additional 5 to 6 mm enhancing masses in the right breast stable   Breast MRI 06/15/2023:.  Radiology recommended MRI guided biopsy of right lumpectomy site, central area of abnormal enhancement right breast   Mammogram and ultrasound 12/01/2023: Indeterminate 1 cm mass at 1130 region right breast, indeterminate complex cystic mass right breast at 11:30 position, stable 8 mm cluster of cysts  12/15/2023: Right breast biopsy: Benign with dense stromal fibrosis and mild adenosis

## 2023-12-22 NOTE — Progress Notes (Signed)
 Patient Care Team: Pcp, No as PCP - General Odean Potts, MD as PCP - Hematology/Oncology (Hematology and Oncology) Services, [facility] as Referring Physician Drury Ned, MD as Referring Physician (Gynecology) Carlie Clark, MD as Consulting Physician (Otolaryngology) Rosine Yancy ORN as Consulting Physician (Neurosurgery)  DIAGNOSIS:  Encounter Diagnosis  Name Primary?   Ductal carcinoma in situ (DCIS) of right breast Yes    SUMMARY OF ONCOLOGIC HISTORY: Oncology History  Ductal carcinoma in situ (DCIS) of right breast  06/29/2019 Initial Diagnosis   Right breast biopsy: Fibrocystic changes, pseudoangiomatous stromal hyperplasia   07/21/2019 Surgery   Right lumpectomy: Dr. Ethyl: Intermediate grade DCIS 0.2 cm, 0.1 cm from anterior margin, LCIS, ER 95%, PR 30% Tis NX stage 0   07/21/2019 Cancer Staging   Staging form: Breast, AJCC 8th Edition - Pathologic stage from 07/21/2019: Stage 0 (pTis (DCIS), pN0, cM0, ER+, PR+) - Signed by Crawford Morna Pickle, NP on 08/03/2019   07/05/2020 -  Anti-estrogen oral therapy   10 mg of tamoxifen  daily      CHIEF COMPLIANT: Follow-up after recent biopsies  HISTORY OF PRESENT ILLNESS: History of Present Illness Catherine Olsen is a 53 year old female who presents for follow-up after a breast biopsy.  The breast biopsy on December 15, 2023, revealed no cancer, providing relief. However, significant stress and anxiety were experienced leading up to the procedure, with a 'stress hangover' and a suicide plan if results had been positive. Since the biopsy, she feels sick and exhausted, attributing these symptoms to stress. She is hesitant to remove the Band-Aids from the biopsy site due to fear of scarring and has avoided bathing to prevent disturbing the area. The biopsy site is taking longer to heal compared to previous procedures.  She is currently on tamoxifen  with enough refills until April next year.     ALLERGIES:  is  allergic to other, prednisone , sulfamethoxazole, oxycodone , abilify  [aripiprazole ], celebrex  [celecoxib ], misc. sulfonamide containing compounds, molds & smuts, penicillins, tylenol  [acetaminophen ], lamictal  [lamotrigine ], percocet [oxycodone -acetaminophen ], sulfa antibiotics, and sulfites.  MEDICATIONS:  Current Outpatient Medications  Medication Sig Dispense Refill   albuterol  (VENTOLIN  HFA) 108 (90 Base) MCG/ACT inhaler Inhale 2 puffs into the lungs every 4 (four) hours as needed for wheezing or shortness of breath. 18 g 1   clonazePAM  (KLONOPIN ) 1 MG tablet Take 1 mg by mouth at bedtime.     EPINEPHrine  0.3 mg/0.3 mL IJ SOAJ injection Inject 0.3 mg into the skin as needed for anaphylaxis. 1 each 0   ibuprofen  (ADVIL ) 800 MG tablet TAKE 1 TABLET(800 MG) BY MOUTH EVERY 8 HOURS AS NEEDED 30 tablet 0   tamoxifen  (NOLVADEX ) 20 MG tablet Take 1 tablet (20 mg total) by mouth daily. 90 tablet 3   No current facility-administered medications for this visit.    PHYSICAL EXAMINATION: ECOG PERFORMANCE STATUS: 1 - Symptomatic but completely ambulatory  Vitals:   12/22/23 1029  BP: 100/76  Pulse: 79  Resp: 18  Temp: 98.1 F (36.7 C)  SpO2: 98%   Filed Weights   12/22/23 1029  Weight: 141 lb 4.8 oz (64.1 kg)    LABORATORY DATA:  I have reviewed the data as listed    Latest Ref Rng & Units 09/23/2023    6:26 PM 04/09/2023   12:44 AM 04/08/2023    6:48 AM  CMP  Glucose 70 - 99 mg/dL 92  886  892   BUN 6 - 20 mg/dL 10  19  16    Creatinine  0.44 - 1.00 mg/dL 9.26  9.32  9.21   Sodium 135 - 145 mmol/L 139  134  137   Potassium 3.5 - 5.1 mmol/L 4.0  3.7  4.2   Chloride 98 - 111 mmol/L 107  107  108   CO2 22 - 32 mmol/L 18  22  23    Calcium  8.9 - 10.3 mg/dL 8.9  8.3  8.8   Total Protein 6.5 - 8.1 g/dL 6.7   6.5   Total Bilirubin 0.0 - 1.2 mg/dL 0.3   0.5   Alkaline Phos 38 - 126 U/L 59   60   AST 15 - 41 U/L 17   17   ALT 0 - 44 U/L 8   14     Lab Results  Component Value Date   WBC  8.4 09/23/2023   HGB 14.8 09/23/2023   HCT 43.0 09/23/2023   MCV 93.5 09/23/2023   PLT 224 09/23/2023   NEUTROABS 11.4 (H) 10/27/2021    ASSESSMENT & PLAN:  Ductal carcinoma in situ (DCIS) of right breast Right lumpectomy: Dr. Ethyl: Intermediate grade DCIS 0.2 cm, 0.1 cm from anterior margin, LCIS, ER 95%, PR 30% Tis NX stage 0  07/06/20: Right Lumpectomy: 0.2 cm IG DCIS Margins Neg, ER /PR Positive Treatment plan: Adjuvant radiation therapy: Patient discussed with Dr. Shannon and decided against doing radiation.  ___________________________________________________________________________ Maxillofacial CT scan 11/22/2019: Normal  Dr. Darden is her psychiatrist   Breast Cancer Surveillance: Mammogram 10/26/2019: Benign breast density category D Breast MRI 06/29/2020: Interval 5.6 x 3.6 x 2.9 cm area of low-grade ductal enhancement in the upper half of right breast.   Patient decided not to undergo surgery for this and is currently on tamoxifen .   Tamoxifen  Toxicities:None   Hospitalization: 01/04/2021-01/10/2021: Mental health crisis  Mammogram 05/08/2021: Benign  Breast MRI 06/17/2022: Improving enhancement of the UIQ and UOQ right breast.  Residual small non-mass enhancement 1.7 x 1 cm (previously 5.6 x 2.9 cm), additional 5 to 6 mm enhancing masses in the right breast stable  Mammogram and ultrasound 12/01/2023: Indeterminate 1 cm mass at 1130 region right breast, indeterminate complex cystic mass right breast at 11:30 position, stable 8 mm cluster of cysts  12/15/2023: Right breast biopsy: Benign with dense stromal fibrosis and mild adenosis  Plan: Mammogram right breast and breast MRI in March 2026 Follow-up after that by telephone visit to discuss results   Assessment & Plan Personal history of malignant neoplasm of breast No signs of malignancy. On tamoxifen  for management. Negative biopsy. Stress related to cancer history and biopsy. - Continue tamoxifen  20 mg orally daily. - Send  tamoxifen  prescription to [facility] for refills. - Schedule MRI of the breast in March for routine surveillance.  Benign right breast cyst under surveillance Right breast cyst monitored. Anatomical challenges noted. Concern expressed about surveillance and non-removal during biopsy. - Order diagnostic mammogram for right breast in six months. - Schedule follow-up phone consultation in six months to review mammogram results.      Orders Placed This Encounter  Procedures   MM DIAG BREAST TOMO UNI RIGHT    Standing Status:   Future    Expected Date:   06/16/2024    Expiration Date:   12/21/2024    Reason for Exam (SYMPTOM  OR DIAGNOSIS REQUIRED):   Right sided cyst F/U    Is the patient pregnant?:   No    Preferred [facility] location?:   [facility]    Release to patient:  Immediate   The patient has a good understanding of the overall plan. she agrees with it. she will call with any problems that may develop before the next visit here. Total time spent: 30 mins including face to face time and time spent for planning, charting and co-ordination of care   Naomi MARLA Chad, MD 12/22/23

## 2024-01-06 ENCOUNTER — Other Ambulatory Visit: Payer: Self-pay | Admitting: Hematology and Oncology

## 2024-01-06 DIAGNOSIS — D0511 Intraductal carcinoma in situ of right breast: Secondary | ICD-10-CM

## 2024-01-17 ENCOUNTER — Emergency Department (HOSPITAL_BASED_OUTPATIENT_CLINIC_OR_DEPARTMENT_OTHER)
Admission: EM | Admit: 2024-01-17 | Discharge: 2024-01-17 | Disposition: A | Discharge status: Home / Self Care | Attending: Emergency Medicine | Admitting: Emergency Medicine

## 2024-01-17 ENCOUNTER — Other Ambulatory Visit: Payer: Self-pay

## 2024-01-17 DIAGNOSIS — R202 Paresthesia of skin: Secondary | ICD-10-CM | POA: Diagnosis present

## 2024-01-17 NOTE — ED Notes (Signed)
 Pt came out of room and stated her phone does not work in here and asked for discharge papers. Discharge papers were not ready yet and Pt was advised of same. Pt stated she didn't need the papers and would read it on [facility]. Pt left without paper work.

## 2024-01-17 NOTE — Discharge Instructions (Addendum)
 Most commonly a compression of the nerves would cause you to have symptoms like this.  Please discuss this with your orthopedic surgeon as well as your oncologist.  If you are having issues with your nitrogen please contact the ophthalmologist and see what they can do to help.

## 2024-01-17 NOTE — ED Provider Notes (Signed)
 [place] EMERGENCY DEPARTMENT AT [facility] Provider Note   CSN: [id number] Arrival date & time: 01/17/24  1928     Patient presents with: Numbness   Catherine Olsen is a 53 y.o. female.   53 yo F with a chief complaints of tingling and some discomfort to the lateral aspect of her left leg.  This been going on for a while now.  She told the nurse several weeks and told me about a week.  She has degenerative disc disease but typically that causes her pain on the right side.  She has radicular pain without and sometimes makes her whole right leg go numb.  She denies trauma to the area nothing seems to make it better or worse.  She also had developed some tingling to the left upper extremity.  She also feels like her pupil is no longer dilated.  She thinks that she might have some sort of vascular problem and decided to come here to be evaluated.        Prior to Admission medications   Medication Sig Start Date End Date Taking? Authorizing Provider  albuterol  (VENTOLIN  HFA) 108 (90 Base) MCG/ACT inhaler Inhale 2 puffs into the lungs every 4 (four) hours as needed for wheezing or shortness of breath. 05/16/21   Brien Belvie BRAVO, MD  clonazePAM  (KLONOPIN ) 1 MG tablet Take 1 mg by mouth at bedtime. 04/04/23   [provider]  EPINEPHrine  0.3 mg/0.3 mL IJ SOAJ injection Inject 0.3 mg into the skin as needed for anaphylaxis. 05/16/21   Brien Belvie BRAVO, MD  ibuprofen  (ADVIL ) 800 MG tablet TAKE 1 TABLET(800 MG) BY MOUTH EVERY 8 HOURS AS NEEDED 03/20/23   Yolande Lamar BROCKS, MD  tamoxifen  (NOLVADEX ) 20 MG tablet Take 1 tablet (20 mg total) by mouth daily. 12/22/23   Gudena, Vinay, MD    Allergies: Other, Prednisone , Sulfamethoxazole, Oxycodone , Abilify  [aripiprazole ], Celebrex  [celecoxib ], Misc. sulfonamide containing compounds, Molds & smuts, Penicillins, Tylenol  [acetaminophen ], Lamictal  [lamotrigine ], Percocet [oxycodone -acetaminophen ], Sulfa antibiotics, and Sulfites     Review of Systems  Updated Vital Signs BP (!) 121/90 (BP Location: Left Arm)   Pulse 86   Temp 97.8 F (36.6 C) (Oral)   Resp 18   Ht 5' 7 (1.702 m)   Wt 64 kg   LMP 11/21/2014   SpO2 98%   BMI 22.10 kg/m   Physical Exam Vitals and nursing note reviewed.  Constitutional:      General: She is not in acute distress.    Appearance: She is well-developed. She is not diaphoretic.  HENT:     Head: Normocephalic and atraumatic.  Eyes:     Pupils: Pupils are equal, round, and reactive to light.  Cardiovascular:     Rate and Rhythm: Normal rate and regular rhythm.     Heart sounds: No murmur heard.    No friction rub. No gallop.  Pulmonary:     Effort: Pulmonary effort is normal.     Breath sounds: No wheezing or rales.  Abdominal:     General: There is no distension.     Palpations: Abdomen is soft.     Tenderness: There is no abdominal tenderness.  Musculoskeletal:        General: No tenderness.     Cervical back: Normal range of motion and neck supple.     Comments: Pulse motor and sensation intact to the left lower extremity.  Pulse motor and sensation intact in the left upper extremity.  No appreciable weakness.  No clonus.  Reflexes are 2+.  No obvious midline spinal tenderness step-offs or deformities.  Skin:    General: Skin is warm and dry.  Neurological:     Mental Status: She is alert and oriented to person, place, and time.  Psychiatric:        Behavior: Behavior normal.     (all labs ordered are listed, but only abnormal results are displayed) Labs Reviewed - No data to display  EKG: None  Radiology: No results found.   Procedures   Medications Ordered in the ED - No data to display                                  Medical Decision Making  53 yo F with a chief complaints of tingling to the lateral aspect of her left leg and to her left arm.  She has been researching this and it is a bit worried that perhaps she has a vascular problem  causing this.  I had a long discussion with her about this.  She has good pulses no symptoms consistent with claudication.  No edema clinically on exam.  By history it sounds like the patient may have no radiculopathy to the arm and leg.  With a history of degenerative disc disease likely the cause.  She has no obvious weakness to either extremity.  She also tells me that she was on medication and that her pupils no longer dilate since then.  She has been having some trouble with her vision at night.  She endorses drug use but denies IV injection.  Not sure that the workup in the emergency department could be beneficial for the symptoms.  I did discuss outpatient follow-up with her orthopedist and oncologist.  I gave her information for the ophthalmologist on-call to perform an eye exam with her concerned that her pupils no longer dilate.  8:30 PM:  I have discussed the diagnosis/risks/treatment options with the patient.  Evaluation and diagnostic testing in the emergency department does not suggest an emergent condition requiring admission or immediate intervention beyond what has been performed at this time.  They will follow up with PCP, optho, ortho, onoclogy. We also discussed returning to the ED immediately if new or worsening sx occur. We discussed the sx which are most concerning (e.g., sudden worsening pain, fever, inability to tolerate by mouth) that necessitate immediate return. Medications administered to the patient during their visit and any new prescriptions provided to the patient are listed below.  Medications given during this visit Medications - No data to display   The patient appears reasonably screen and/or stabilized for discharge and I doubt any other medical condition or other [facility] requiring further screening, evaluation, or treatment in the ED at this time prior to discharge.         Final diagnoses:  Paresthesia    ED Discharge Orders     None           Emil Share, DO 01/17/24 2030

## 2024-01-17 NOTE — ED Triage Notes (Signed)
 Pt to triage c/o left leg numbness x several weeks. PT VSS NAD PT has full ROM in all four extremities. NIH 0 PT denies fever chills or injury.

## 2024-03-05 ENCOUNTER — Encounter (HOSPITAL_BASED_OUTPATIENT_CLINIC_OR_DEPARTMENT_OTHER): Payer: Self-pay | Admitting: Emergency Medicine

## 2024-03-05 ENCOUNTER — Emergency Department (HOSPITAL_BASED_OUTPATIENT_CLINIC_OR_DEPARTMENT_OTHER)
Admission: EM | Admit: 2024-03-05 | Discharge: 2024-03-05 | Disposition: A | Discharge status: Home / Self Care | Attending: Emergency Medicine | Admitting: Emergency Medicine

## 2024-03-05 DIAGNOSIS — R220 Localized swelling, mass and lump, head: Secondary | ICD-10-CM | POA: Diagnosis present

## 2024-03-05 DIAGNOSIS — J320 Chronic maxillary sinusitis: Secondary | ICD-10-CM | POA: Insufficient documentation

## 2024-03-05 MED ORDER — LEVOFLOXACIN 750 MG PO TABS: 750.0000 mg | ORAL_TABLET | Freq: Every day | ORAL | 0 refills | Status: DC

## 2024-03-05 MED ORDER — LEVOFLOXACIN 750 MG PO TABS: 750.0000 mg | ORAL_TABLET | Freq: Every day | ORAL | 0 refills | Status: AC

## 2024-03-05 NOTE — ED Triage Notes (Signed)
 Pt c/o facial swelling, LT nares and LT cheek x 1 week.  Also reports ear pain, recent tx at [facility] for same

## 2024-03-05 NOTE — ED Notes (Signed)
 Discharge paperwork reviewed entirely with patient, including follow up care. Pain was under control. The patient received instruction and coaching on their prescriptions, and all follow-up questions were answered.  Pt verbalized understanding as well as all parties involved. No questions or concerns voiced at the time of discharge. No acute distress noted. Pt was encouraged to stay adequately hydrated and eat a healthy diet.   Pt ambulated out to PVA without incident or assistance.  Pt advised they will seek followup care with a specialist and followup with their PCP.   The pt was instructed to set up and/or review MyChart for their results; and was informed their Providers all have access to the information as well.

## 2024-03-05 NOTE — Discharge Instructions (Signed)
 You were seen for your sinus infection in the emergency department.   At home, please take the Levaquin  we have prescribed you.  Please stop taking the Flonase  that you have taken before.  Perform sinus rinses for any congestion.    Check your MyChart online for the results of any tests that had not resulted by the time you left the emergency department.   Follow-up with your primary doctor in 2-3 days regarding your visit.  Follow-up with ENT as soon as possible.  Return immediately to the emergency department if you experience any of the following: Worsening pain, fevers, vision changes, or any other concerning symptoms.    Thank you for visiting our Emergency Department. It was a pleasure taking care of you today.

## 2024-03-05 NOTE — ED Provider Notes (Signed)
 [facility] AT [facility] Provider Note   CSN: [id number] Arrival date & time: 03/05/24  9061     Patient presents with: Facial Swelling   Catherine Olsen is a 53 y.o. female.   53 year old female with a history of sinusitis who presents emergency department with left-sided sinus pressure and swelling.  Symptoms started over a week ago.  Says that she feels like her left face also has started to become swollen.  Had a dental infection that progressed to osteomyelitis in the past so wanted to be evaluated.  Also noticed that her left nasal turbinates were red and wanted to be evaluated.  No nasal drainage.  No fevers.  No dental pain.       Prior to Admission medications   Medication Sig Start Date End Date Taking? Authorizing Provider  albuterol  (VENTOLIN  HFA) 108 (90 Base) MCG/ACT inhaler Inhale 2 puffs into the lungs every 4 (four) hours as needed for wheezing or shortness of breath. 05/16/21   Brien Belvie BRAVO, MD  clonazePAM  (KLONOPIN ) 1 MG tablet Take 1 mg by mouth at bedtime. 04/04/23   [provider]  EPINEPHrine  0.3 mg/0.3 mL IJ SOAJ injection Inject 0.3 mg into the skin as needed for anaphylaxis. 05/16/21   Brien Belvie BRAVO, MD  ibuprofen  (ADVIL ) 800 MG tablet TAKE 1 TABLET(800 MG) BY MOUTH EVERY 8 HOURS AS NEEDED 03/20/23   Yolande Lamar BROCKS, MD  levofloxacin  (LEVAQUIN ) 750 MG tablet Take 1 tablet (750 mg total) by mouth daily for 10 days. 03/05/24 03/15/24  Yolande Lamar BROCKS, MD  tamoxifen  (NOLVADEX ) 20 MG tablet Take 1 tablet (20 mg total) by mouth daily. 12/22/23   Gudena, Vinay, MD    Allergies: Other, Prednisone , Sulfamethoxazole, Oxycodone , Abilify  [aripiprazole ], Celebrex  [celecoxib ], Misc. sulfonamide containing compounds, Molds & smuts, Penicillins, Tylenol  [acetaminophen ], Lamictal  [lamotrigine ], Percocet [oxycodone -acetaminophen ], Sulfa antibiotics, and Sulfites    Review of Systems  Updated Vital Signs BP (!) 138/90 (BP  Location: Right Arm)   Pulse 86   Temp (!) 97.5 F (36.4 C) (Oral)   Resp 18   LMP 11/21/2014   SpO2 100%   Physical Exam Constitutional:      Appearance: Normal appearance.  HENT:     Head: Normocephalic and atraumatic.     Comments: Left maxillary sinus tenderness to palpation    Right Ear: Tympanic membrane, ear canal and external ear normal.     Left Ear: Tympanic membrane, ear canal and external ear normal.     Nose: Congestion present.     Mouth/Throat:     Mouth: Mucous membranes are moist.     Pharynx: Oropharynx is clear.     Comments: No tenderness to palpation of any of her teeth.  No loose teeth. Eyes:     Extraocular Movements: Extraocular movements intact.     Conjunctiva/sclera: Conjunctivae normal.     Pupils: Pupils are equal, round, and reactive to light.  Pulmonary:     Effort: Pulmonary effort is normal.     Breath sounds: No stridor.  Musculoskeletal:     Cervical back: Normal range of motion and neck supple.  Neurological:     Mental Status: She is alert.     (all labs ordered are listed, but only abnormal results are displayed) Labs Reviewed - No data to display  EKG: None  Radiology: No results found.   Procedures   Medications Ordered in the ED - No data to display  Medical Decision Making Risk Prescription drug management.   53 year old female with a history of sinusitis who presents emergency department with left-sided sinus pressure and swelling.   Initial Ddx:  Sinusitis, dental infection, osteomyelitis, URI  MDM/Course:  Patient presents emergency department with left maxillary sinus pain.  Also reports some subjective swelling to the left side of her face.  No dental pain.  No fevers or chills.  On exam has minimal swelling of her left cheek.  Unclear how far this is from her baseline.  Does have maxillary sinus tenderness to palpation.  No signs of dental infection currently.  Suspect that  she likely has sinusitis and given the duration of her symptoms will initiate Levaquin  and have her follow-up with the ENT.  Return precautions discussed prior to discharge  This patient presents to the ED for concern of complaints listed in HPI, this involves an extensive number of treatment options, and is a complaint that carries with it a high risk of complications and morbidity. Disposition including potential need for admission considered.   Dispo: DC Home. Return precautions discussed including, but not limited to, those listed in the AVS. Allowed pt time to ask questions which were answered fully prior to dc.  Records reviewed Outpatient Clinic Notes I have reviewed the patients home medications and made adjustments as needed  Portions of this note were generated with Dragon dictation software. Dictation errors may occur despite best attempts at proofreading.     Final diagnoses:  Maxillary sinusitis, unspecified chronicity    ED Discharge Orders          Ordered    levofloxacin  (LEVAQUIN ) 750 MG tablet  Daily,   Status:  Discontinued        03/05/24 1102    levofloxacin  (LEVAQUIN ) 750 MG tablet  Daily        03/05/24 1102               Yolande Lamar BROCKS, MD 03/05/24 1110

## 2024-04-29 ENCOUNTER — Encounter: Payer: Self-pay | Admitting: Hematology and Oncology

## 2024-05-04 ENCOUNTER — Other Ambulatory Visit: Payer: Self-pay

## 2024-05-04 DIAGNOSIS — D0511 Intraductal carcinoma in situ of right breast: Secondary | ICD-10-CM

## 2024-05-04 NOTE — Progress Notes (Signed)
 Pt sent mychart request for appt on 3/23 to be changed to in person visit. Appt change complete.

## 2024-05-12 ENCOUNTER — Telehealth (HOSPITAL_COMMUNITY): Payer: Self-pay | Admitting: Licensed Clinical Social Worker

## 2024-05-12 NOTE — Telephone Encounter (Signed)
 Cln called to f/u on pt's request for PHP per Suzen Fire at [facility] outpt. Pt was circumstantial, tangential at times, and interjected repeatedly, making it difficult to communicate information. Cln inquired about current substance use and brief tx history. 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 Pt reports she has a psychiatrist. She also states her therapist is recommending a psychologist that does DBT. Pt reports frequent barriers to appropriate treatment due to having Medicare and secondary Medicaid. Cln staffed with PHP NP Staci Kerns, who declined pt and stated pt is not group appropriate, to which cln agreed and informed pt that this PHP is unlikely to be the appropriate level of care. Cln recommended [facility] and pt cites transportation as a barrier, and cln recommended she contact Medicare to ask about medical transportation to assist. Cln also suggested pt pursue [employer], as they offer scholarships for individuals OON. Pt reports previously seeing an intern at [employer] and said it was a poor fit. She expressed willingness to try again. Cln also suggesting NA and/or AA to assist with substance use in lieu of residential tx, as pt repeatedly stated [facility] did not work for her. Pt stated, Those people scare the shit out of me regarding AA/NA. Cln informed pt that some individuals switch to Medicaid in order to access more resources, to which pt was not receptive. Pt expressed frustration about the difficulty in accessing the appropriate level of care and cln agreed that there are not enough mental health resources in the country. Pt stated she will try [facility], [facility], and call Medicare to inquire about additional resources per cln's suggestion. Pt expressed appreciation and cln wished pt well.

## 2024-05-30 ENCOUNTER — Inpatient Hospital Stay

## 2024-06-14 ENCOUNTER — Encounter

## 2024-06-14 ENCOUNTER — Other Ambulatory Visit

## 2024-06-27 ENCOUNTER — Inpatient Hospital Stay

## 2024-06-27 ENCOUNTER — Ambulatory Visit: Admitting: Hematology and Oncology
